# Patient Record
Sex: Male | Born: 1960
Health system: Southern US, Community
[De-identification: ages and names within clinical notes are randomized; demographics above are authoritative.]

## PROBLEM LIST (undated history)

## (undated) DIAGNOSIS — E785 Hyperlipidemia, unspecified: Secondary | ICD-10-CM

## (undated) DIAGNOSIS — Z950 Presence of cardiac pacemaker: Secondary | ICD-10-CM

## (undated) DIAGNOSIS — Z95810 Presence of automatic (implantable) cardiac defibrillator: Secondary | ICD-10-CM

## (undated) DIAGNOSIS — J3089 Other allergic rhinitis: Secondary | ICD-10-CM

## (undated) DIAGNOSIS — I2510 Atherosclerotic heart disease of native coronary artery without angina pectoris: Secondary | ICD-10-CM

## (undated) DIAGNOSIS — G629 Polyneuropathy, unspecified: Secondary | ICD-10-CM

## (undated) DIAGNOSIS — C9000 Multiple myeloma not having achieved remission: Secondary | ICD-10-CM

## (undated) DIAGNOSIS — F32A Depression, unspecified: Secondary | ICD-10-CM

## (undated) DIAGNOSIS — M1990 Unspecified osteoarthritis, unspecified site: Secondary | ICD-10-CM

## (undated) DIAGNOSIS — I639 Cerebral infarction, unspecified: Secondary | ICD-10-CM

## (undated) DIAGNOSIS — J449 Chronic obstructive pulmonary disease, unspecified: Secondary | ICD-10-CM

## (undated) DIAGNOSIS — I219 Acute myocardial infarction, unspecified: Secondary | ICD-10-CM

## (undated) DIAGNOSIS — F329 Major depressive disorder, single episode, unspecified: Secondary | ICD-10-CM

## (undated) DIAGNOSIS — F1910 Other psychoactive substance abuse, uncomplicated: Secondary | ICD-10-CM

## (undated) DIAGNOSIS — Q211 Atrial septal defect, unspecified: Secondary | ICD-10-CM

## (undated) DIAGNOSIS — E46 Unspecified protein-calorie malnutrition: Secondary | ICD-10-CM

## (undated) DIAGNOSIS — F419 Anxiety disorder, unspecified: Secondary | ICD-10-CM

## (undated) DIAGNOSIS — K219 Gastro-esophageal reflux disease without esophagitis: Secondary | ICD-10-CM

## (undated) DIAGNOSIS — I509 Heart failure, unspecified: Secondary | ICD-10-CM

## (undated) DIAGNOSIS — I499 Cardiac arrhythmia, unspecified: Secondary | ICD-10-CM

## (undated) DIAGNOSIS — R0600 Dyspnea, unspecified: Secondary | ICD-10-CM

## (undated) HISTORY — DX: Chronic obstructive pulmonary disease, unspecified: J44.9

## (undated) HISTORY — PX: TOOTH EXTRACTION: SUR596

## (undated) HISTORY — DX: Hyperlipidemia, unspecified: E78.5

## (undated) HISTORY — DX: Gastro-esophageal reflux disease without esophagitis: K21.9

## (undated) HISTORY — PX: CARDIAC DEFIBRILLATOR PLACEMENT: SHX171

## (undated) HISTORY — DX: Heart failure, unspecified: I50.9

## (undated) HISTORY — DX: Atherosclerotic heart disease of native coronary artery without angina pectoris: I25.10

## (undated) HISTORY — DX: Atrial septal defect: Q21.1

## (undated) HISTORY — DX: Acute myocardial infarction, unspecified: I21.9

## (undated) HISTORY — DX: Other psychoactive substance abuse, uncomplicated: F19.10

## (undated) HISTORY — DX: Atrial septal defect, unspecified: Q21.10

## (undated) HISTORY — DX: Unspecified osteoarthritis, unspecified site: M19.90

## (undated) HISTORY — DX: Cardiac arrhythmia, unspecified: I49.9

## (undated) HISTORY — DX: Anxiety disorder, unspecified: F41.9

---
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
## 2008-04-21 ENCOUNTER — Emergency Department: Payer: Self-pay | Admitting: Emergency Medicine

## 2011-05-17 ENCOUNTER — Inpatient Hospital Stay: Payer: Self-pay | Admitting: Internal Medicine

## 2011-05-17 LAB — CBC
HCT: 35.9 % — ABNORMAL LOW (ref 40.0–52.0)
RBC: 3.75 10*6/uL — ABNORMAL LOW (ref 4.40–5.90)
RDW: 14.2 % (ref 11.5–14.5)
WBC: 6.1 10*3/uL (ref 3.8–10.6)

## 2011-05-17 LAB — DRUG SCREEN, URINE
Barbiturates, Ur Screen: NEGATIVE (ref ?–200)
Benzodiazepine, Ur Scrn: NEGATIVE (ref ?–200)
Cannabinoid 50 Ng, Ur ~~LOC~~: NEGATIVE (ref ?–50)
Cocaine Metabolite,Ur ~~LOC~~: NEGATIVE (ref ?–300)
MDMA (Ecstasy)Ur Screen: NEGATIVE (ref ?–500)
Methadone, Ur Screen: NEGATIVE (ref ?–300)
Phencyclidine (PCP) Ur S: NEGATIVE (ref ?–25)
Tricyclic, Ur Screen: NEGATIVE (ref ?–1000)

## 2011-05-17 LAB — COMPREHENSIVE METABOLIC PANEL
Albumin: 3.1 g/dL — ABNORMAL LOW (ref 3.4–5.0)
Alkaline Phosphatase: 54 U/L (ref 50–136)
Anion Gap: 9 (ref 7–16)
BUN: 12 mg/dL (ref 7–18)
Bilirubin,Total: 0.4 mg/dL (ref 0.2–1.0)
Chloride: 107 mmol/L (ref 98–107)
Co2: 23 mmol/L (ref 21–32)
EGFR (Non-African Amer.): >60
Glucose: 89 mg/dL (ref 65–99)
Osmolality: 277 (ref 275–301)
Potassium: 4 mmol/L (ref 3.5–5.1)
SGOT(AST): 67 U/L — ABNORMAL HIGH (ref 15–37)
SGPT (ALT): 58 U/L

## 2011-05-17 LAB — CK-MB: CK-MB: 4.3 ng/mL — ABNORMAL HIGH (ref 0.5–3.6)

## 2011-05-17 LAB — PROTIME-INR: INR: 1

## 2011-05-17 LAB — MAGNESIUM: Magnesium: 1.6 mg/dL — ABNORMAL LOW

## 2011-05-17 LAB — TROPONIN I: Troponin-I: 1.2 ng/mL — ABNORMAL HIGH

## 2011-05-17 LAB — TSH: Thyroid Stimulating Horm: 1.68 u[IU]/mL

## 2011-05-17 LAB — CK: CK, Total: 215 U/L (ref 35–232)

## 2011-05-18 LAB — COMPREHENSIVE METABOLIC PANEL
Albumin: 2.6 g/dL — ABNORMAL LOW (ref 3.4–5.0)
Anion Gap: 8 (ref 7–16)
Bilirubin,Total: 0.4 mg/dL (ref 0.2–1.0)
Calcium, Total: 7.9 mg/dL — ABNORMAL LOW (ref 8.5–10.1)
Co2: 19 mmol/L — ABNORMAL LOW (ref 21–32)
EGFR (African American): >60
EGFR (Non-African Amer.): >60
Potassium: 4.8 mmol/L (ref 3.5–5.1)
SGPT (ALT): 151 U/L — ABNORMAL HIGH
Total Protein: 7.1 g/dL (ref 6.4–8.2)

## 2011-05-18 LAB — TSH: Thyroid Stimulating Horm: 0.25 u[IU]/mL — ABNORMAL LOW

## 2011-05-18 LAB — HEMOGLOBIN: HGB: 11.7 g/dL — ABNORMAL LOW (ref 13.0–18.0)

## 2011-05-19 LAB — CBC WITH DIFFERENTIAL/PLATELET
Basophil #: 0 10*3/uL (ref 0.0–0.1)
Basophil %: 0.1 %
Eosinophil #: 0 10*3/uL (ref 0.0–0.7)
Eosinophil %: 0 %
HGB: 11.6 g/dL — ABNORMAL LOW (ref 13.0–18.0)
Lymphocyte #: 0.6 10*3/uL — ABNORMAL LOW (ref 1.0–3.6)
Lymphocyte %: 4.4 %
MCH: 32 pg (ref 26.0–34.0)
MCHC: 32.8 g/dL (ref 32.0–36.0)
MCV: 98 fL (ref 80–100)
Neutrophil #: 13.1 10*3/uL — ABNORMAL HIGH (ref 1.4–6.5)
Platelet: 221 10*3/uL (ref 150–440)
RBC: 3.64 10*6/uL — ABNORMAL LOW (ref 4.40–5.90)
RDW: 14.6 % — ABNORMAL HIGH (ref 11.5–14.5)
WBC: 14 10*3/uL — ABNORMAL HIGH (ref 3.8–10.6)

## 2011-05-19 LAB — HEPATIC FUNCTION PANEL A (ARMC)
Albumin: 2.7 g/dL — ABNORMAL LOW (ref 3.4–5.0)
Alkaline Phosphatase: 110 U/L (ref 50–136)
Bilirubin,Total: 0.2 mg/dL (ref 0.2–1.0)
SGOT(AST): 43 U/L — ABNORMAL HIGH (ref 15–37)
SGPT (ALT): 93 U/L — ABNORMAL HIGH
Total Protein: 7.2 g/dL (ref 6.4–8.2)

## 2011-05-23 LAB — CULTURE, BLOOD (SINGLE)

## 2011-06-01 ENCOUNTER — Emergency Department: Payer: Self-pay | Admitting: *Deleted

## 2011-06-27 LAB — CBC
HCT: 39.7 % — ABNORMAL LOW (ref 40.0–52.0)
MCH: 32.4 pg (ref 26.0–34.0)
MCHC: 33.8 g/dL (ref 32.0–36.0)
Platelet: 189 10*3/uL (ref 150–440)
RBC: 4.14 10*6/uL — ABNORMAL LOW (ref 4.40–5.90)

## 2011-06-27 LAB — BASIC METABOLIC PANEL
BUN: 11 mg/dL (ref 7–18)
Co2: 23 mmol/L (ref 21–32)
EGFR (African American): >60
Glucose: 99 mg/dL (ref 65–99)
Potassium: 4.4 mmol/L (ref 3.5–5.1)

## 2011-06-27 LAB — PRO B NATRIURETIC PEPTIDE: B-Type Natriuretic Peptide: 5359 pg/mL — ABNORMAL HIGH (ref 0–125)

## 2011-06-27 LAB — CK TOTAL AND CKMB (NOT AT ARMC): CK-MB: 1.5 ng/mL (ref 0.5–3.6)

## 2011-06-28 ENCOUNTER — Inpatient Hospital Stay: Payer: Self-pay | Admitting: Internal Medicine

## 2011-06-28 LAB — TROPONIN I
Troponin-I: 0.76 ng/mL — ABNORMAL HIGH
Troponin-I: 0.73 ng/mL — ABNORMAL HIGH

## 2011-06-28 LAB — DRUG SCREEN, URINE
Benzodiazepine, Ur Scrn: POSITIVE (ref ?–200)
Cannabinoid 50 Ng, Ur ~~LOC~~: POSITIVE (ref ?–50)
Cocaine Metabolite,Ur ~~LOC~~: NEGATIVE (ref ?–300)
MDMA (Ecstasy)Ur Screen: NEGATIVE (ref ?–500)
Opiate, Ur Screen: NEGATIVE (ref ?–300)

## 2011-06-28 LAB — CK TOTAL AND CKMB (NOT AT ARMC): CK, Total: 40 U/L (ref 35–232)

## 2011-06-28 LAB — ETHANOL: Ethanol %: <0.003 % (ref 0.000–0.080)

## 2011-06-29 LAB — CBC
HGB: 13.5 g/dL (ref 13.0–18.0)
MCH: 32.6 pg (ref 26.0–34.0)
MCHC: 33.5 g/dL (ref 32.0–36.0)
RBC: 4.13 10*6/uL — ABNORMAL LOW (ref 4.40–5.90)
RDW: 14.9 % — ABNORMAL HIGH (ref 11.5–14.5)
WBC: 6 10*3/uL (ref 3.8–10.6)

## 2011-06-29 LAB — COMPREHENSIVE METABOLIC PANEL
Alkaline Phosphatase: 55 U/L (ref 50–136)
Anion Gap: 9 (ref 7–16)
Co2: 22 mmol/L (ref 21–32)
Creatinine: 0.99 mg/dL (ref 0.60–1.30)
EGFR (Non-African Amer.): >60
Potassium: 4.6 mmol/L (ref 3.5–5.1)
SGOT(AST): 24 U/L (ref 15–37)
SGPT (ALT): 31 U/L
Total Protein: 7.2 g/dL (ref 6.4–8.2)

## 2011-06-29 LAB — MAGNESIUM: Magnesium: 1.5 mg/dL — ABNORMAL LOW

## 2011-06-30 LAB — BASIC METABOLIC PANEL
Calcium, Total: 8.3 mg/dL — ABNORMAL LOW (ref 8.5–10.1)
Co2: 22 mmol/L (ref 21–32)
Glucose: 104 mg/dL — ABNORMAL HIGH (ref 65–99)
Osmolality: 279 (ref 275–301)
Sodium: 139 mmol/L (ref 136–145)

## 2011-07-26 DIAGNOSIS — F17200 Nicotine dependence, unspecified, uncomplicated: Secondary | ICD-10-CM | POA: Insufficient documentation

## 2011-07-26 DIAGNOSIS — J449 Chronic obstructive pulmonary disease, unspecified: Secondary | ICD-10-CM | POA: Insufficient documentation

## 2011-08-21 DIAGNOSIS — F419 Anxiety disorder, unspecified: Secondary | ICD-10-CM | POA: Insufficient documentation

## 2011-08-21 DIAGNOSIS — K219 Gastro-esophageal reflux disease without esophagitis: Secondary | ICD-10-CM | POA: Insufficient documentation

## 2011-08-22 DIAGNOSIS — I499 Cardiac arrhythmia, unspecified: Secondary | ICD-10-CM | POA: Insufficient documentation

## 2011-08-22 DIAGNOSIS — I219 Acute myocardial infarction, unspecified: Secondary | ICD-10-CM | POA: Insufficient documentation

## 2011-08-22 DIAGNOSIS — Z95810 Presence of automatic (implantable) cardiac defibrillator: Secondary | ICD-10-CM

## 2011-08-22 DIAGNOSIS — I2510 Atherosclerotic heart disease of native coronary artery without angina pectoris: Secondary | ICD-10-CM | POA: Insufficient documentation

## 2011-08-22 DIAGNOSIS — I459 Conduction disorder, unspecified: Secondary | ICD-10-CM | POA: Insufficient documentation

## 2011-08-22 HISTORY — DX: Presence of automatic (implantable) cardiac defibrillator: Z95.810

## 2011-11-20 DIAGNOSIS — Z95810 Presence of automatic (implantable) cardiac defibrillator: Secondary | ICD-10-CM | POA: Insufficient documentation

## 2012-05-29 ENCOUNTER — Inpatient Hospital Stay: Payer: Self-pay | Admitting: Internal Medicine

## 2012-05-29 LAB — URINALYSIS, COMPLETE
Bacteria: NONE SEEN
Bilirubin,UR: NEGATIVE
Blood: NEGATIVE
Ketone: NEGATIVE
Leukocyte Esterase: NEGATIVE
Nitrite: NEGATIVE
Ph: 8 (ref 4.5–8.0)
Protein: NEGATIVE
RBC,UR: NONE SEEN /HPF (ref 0–5)
Specific Gravity: 1.009 (ref 1.003–1.030)
Squamous Epithelial: NONE SEEN
WBC UR: NONE SEEN /HPF (ref 0–5)

## 2012-05-29 LAB — DRUG SCREEN, URINE
Amphetamines, Ur Screen: NEGATIVE (ref ?–1000)
Barbiturates, Ur Screen: NEGATIVE (ref ?–200)
Cannabinoid 50 Ng, Ur ~~LOC~~: POSITIVE (ref ?–50)
Phencyclidine (PCP) Ur S: NEGATIVE (ref ?–25)

## 2012-05-29 LAB — CBC
HCT: 35 % — ABNORMAL LOW (ref 40.0–52.0)
HGB: 12.4 g/dL — ABNORMAL LOW (ref 13.0–18.0)
MCH: 34.9 pg — ABNORMAL HIGH (ref 26.0–34.0)
MCV: 99 fL (ref 80–100)
Platelet: 222 10*3/uL (ref 150–440)
RDW: 14.5 % (ref 11.5–14.5)
WBC: 5 10*3/uL (ref 3.8–10.6)

## 2012-05-29 LAB — APTT
Activated PTT: 43.8 secs — ABNORMAL HIGH (ref 23.6–35.9)
Activated PTT: 75.9 secs — ABNORMAL HIGH (ref 23.6–35.9)

## 2012-05-29 LAB — TROPONIN I: Troponin-I: 0.24 ng/mL — ABNORMAL HIGH

## 2012-05-29 LAB — COMPREHENSIVE METABOLIC PANEL
Albumin: 3.2 g/dL — ABNORMAL LOW (ref 3.4–5.0)
Alkaline Phosphatase: 43 U/L — ABNORMAL LOW (ref 50–136)
BUN: 10 mg/dL (ref 7–18)
Bilirubin,Total: 0.1 mg/dL — ABNORMAL LOW (ref 0.2–1.0)
Chloride: 107 mmol/L (ref 98–107)
Co2: 27 mmol/L (ref 21–32)
Creatinine: 0.9 mg/dL (ref 0.60–1.30)
EGFR (African American): >60
EGFR (Non-African Amer.): >60
Glucose: 80 mg/dL (ref 65–99)
Osmolality: 272 (ref 275–301)
SGOT(AST): 21 U/L (ref 15–37)

## 2012-05-29 LAB — CK TOTAL AND CKMB (NOT AT ARMC)
CK, Total: 101 U/L (ref 35–232)
CK-MB: 1 ng/mL (ref 0.5–3.6)

## 2012-05-29 LAB — PROTIME-INR: Prothrombin Time: 17.8 secs — ABNORMAL HIGH (ref 11.5–14.7)

## 2012-05-29 LAB — PRO B NATRIURETIC PEPTIDE: B-Type Natriuretic Peptide: 297 pg/mL — ABNORMAL HIGH (ref 0–125)

## 2012-05-30 LAB — LIPID PANEL
HDL Cholesterol: 39 mg/dL — ABNORMAL LOW (ref 40–60)
Ldl Cholesterol, Calc: 81 mg/dL (ref 0–100)
Triglycerides: 73 mg/dL (ref 0–200)
VLDL Cholesterol, Calc: 15 mg/dL (ref 5–40)

## 2012-05-30 LAB — PROTIME-INR
INR: 1.9
Prothrombin Time: 21.1 secs — ABNORMAL HIGH (ref 11.5–14.7)

## 2012-05-30 LAB — TROPONIN I: Troponin-I: 0.45 ng/mL — ABNORMAL HIGH

## 2012-05-31 LAB — APTT: Activated PTT: 40.5 secs — ABNORMAL HIGH (ref 23.6–35.9)

## 2013-01-07 ENCOUNTER — Ambulatory Visit: Payer: Self-pay | Admitting: Physician Assistant

## 2013-09-24 LAB — COMPREHENSIVE METABOLIC PANEL
AST: 25 U/L (ref 15–37)
Albumin: 2.6 g/dL — ABNORMAL LOW (ref 3.4–5.0)
Alkaline Phosphatase: 40 U/L — ABNORMAL LOW
Anion Gap: 4 — ABNORMAL LOW (ref 7–16)
BUN: 16 mg/dL (ref 7–18)
Bilirubin,Total: 0.3 mg/dL (ref 0.2–1.0)
CREATININE: 1.79 mg/dL — AB (ref 0.60–1.30)
Calcium, Total: 10.2 mg/dL — ABNORMAL HIGH (ref 8.5–10.1)
Chloride: 101 mmol/L (ref 98–107)
Co2: 26 mmol/L (ref 21–32)
EGFR (African American): 49 — ABNORMAL LOW
GFR CALC NON AF AMER: 42 — AB
Glucose: 84 mg/dL (ref 65–99)
OSMOLALITY: 263 (ref 275–301)
Potassium: 4.8 mmol/L (ref 3.5–5.1)
SGPT (ALT): 10 U/L — ABNORMAL LOW
SODIUM: 131 mmol/L — AB (ref 136–145)
TOTAL PROTEIN: 12.5 g/dL — AB (ref 6.4–8.2)

## 2013-09-24 LAB — CBC WITH DIFFERENTIAL/PLATELET
BASOS ABS: 0 10*3/uL (ref 0.0–0.1)
Basophil %: 0.5 %
EOS ABS: 0 10*3/uL (ref 0.0–0.7)
EOS PCT: 0.2 %
HCT: 25.7 % — ABNORMAL LOW (ref 40.0–52.0)
HGB: 8.6 g/dL — ABNORMAL LOW (ref 13.0–18.0)
LYMPHS PCT: 15.3 %
Lymphocyte #: 1.3 10*3/uL (ref 1.0–3.6)
MCH: 34.5 pg — AB (ref 26.0–34.0)
MCHC: 33.3 g/dL (ref 32.0–36.0)
MCV: 103 fL — ABNORMAL HIGH (ref 80–100)
Monocyte #: 0.9 x10 3/mm (ref 0.2–1.0)
Monocyte %: 10.4 %
NEUTROS ABS: 6.3 10*3/uL (ref 1.4–6.5)
Neutrophil %: 73.6 %
PLATELETS: 285 10*3/uL (ref 150–440)
RBC: 2.48 10*6/uL — ABNORMAL LOW (ref 4.40–5.90)
RDW: 14.8 % — AB (ref 11.5–14.5)
WBC: 8.6 10*3/uL (ref 3.8–10.6)

## 2013-09-24 LAB — URINALYSIS, COMPLETE
BILIRUBIN, UR: NEGATIVE
BLOOD: NEGATIVE
Bacteria: NONE SEEN
Glucose,UR: NEGATIVE mg/dL (ref 0–75)
Ketone: NEGATIVE
Leukocyte Esterase: NEGATIVE
NITRITE: NEGATIVE
PH: 6 (ref 4.5–8.0)
RBC,UR: 1 /HPF (ref 0–5)
SQUAMOUS EPITHELIAL: NONE SEEN
Specific Gravity: 1.011 (ref 1.003–1.030)
WBC UR: 1 /HPF (ref 0–5)

## 2013-09-24 LAB — DRUG SCREEN, URINE
AMPHETAMINES, UR SCREEN: NEGATIVE (ref ?–1000)
BENZODIAZEPINE, UR SCRN: NEGATIVE (ref ?–200)
Barbiturates, Ur Screen: NEGATIVE (ref ?–200)
Cannabinoid 50 Ng, Ur ~~LOC~~: POSITIVE (ref ?–50)
Cocaine Metabolite,Ur ~~LOC~~: POSITIVE (ref ?–300)
MDMA (ECSTASY) UR SCREEN: NEGATIVE (ref ?–500)
Methadone, Ur Screen: NEGATIVE (ref ?–300)
Opiate, Ur Screen: NEGATIVE (ref ?–300)
Phencyclidine (PCP) Ur S: NEGATIVE (ref ?–25)
Tricyclic, Ur Screen: NEGATIVE (ref ?–1000)

## 2013-09-24 LAB — RAPID HIV SCREEN (HIV 1/2 AB+AG)

## 2013-09-24 LAB — LIPASE, BLOOD: LIPASE: 43 U/L — AB (ref 73–393)

## 2013-09-24 LAB — TROPONIN I: Troponin-I: 0.09 ng/mL — ABNORMAL HIGH

## 2013-09-25 ENCOUNTER — Inpatient Hospital Stay: Payer: Self-pay | Admitting: Internal Medicine

## 2013-09-25 LAB — PSA: PSA: 0.6 ng/mL (ref 0.0–4.0)

## 2013-09-25 LAB — HCG, QUANTITATIVE, PREGNANCY: Beta Hcg, Quant.: <1 m[IU]/mL — ABNORMAL LOW

## 2013-09-25 LAB — CANCER ANTIGEN 19-9: CA 19-9: 9 U/mL (ref 0–35)

## 2013-09-25 LAB — IRON AND TIBC
Iron Bind.Cap.(Total): 259 ug/dL (ref 250–450)
Iron Saturation: 23 %
Iron: 59 ug/dL — ABNORMAL LOW (ref 65–175)
Unbound Iron-Bind.Cap.: 200 ug/dL

## 2013-09-25 LAB — CEA: CEA: 1.2 ng/mL (ref 0.0–4.7)

## 2013-09-25 LAB — TROPONIN I
TROPONIN-I: 0.12 ng/mL — AB
Troponin-I: 0.11 ng/mL — ABNORMAL HIGH

## 2013-09-26 ENCOUNTER — Ambulatory Visit: Payer: Self-pay | Admitting: Urology

## 2013-09-26 ENCOUNTER — Ambulatory Visit: Payer: Self-pay | Admitting: Internal Medicine

## 2013-09-26 LAB — BASIC METABOLIC PANEL
ANION GAP: 4 — AB (ref 7–16)
BUN: 23 mg/dL — ABNORMAL HIGH (ref 7–18)
CALCIUM: 9.2 mg/dL (ref 8.5–10.1)
CHLORIDE: 102 mmol/L (ref 98–107)
CO2: 26 mmol/L (ref 21–32)
Creatinine: 1.62 mg/dL — ABNORMAL HIGH (ref 0.60–1.30)
GFR CALC AF AMER: 55 — AB
GFR CALC NON AF AMER: 47 — AB
Glucose: 72 mg/dL (ref 65–99)
Osmolality: 267 (ref 275–301)
POTASSIUM: 4.5 mmol/L (ref 3.5–5.1)
Sodium: 132 mmol/L — ABNORMAL LOW (ref 136–145)

## 2013-09-26 LAB — CBC WITH DIFFERENTIAL/PLATELET
BASOS ABS: 0 10*3/uL (ref 0.0–0.1)
Basophil %: 0.3 %
EOS PCT: 0.9 %
Eosinophil #: 0 10*3/uL (ref 0.0–0.7)
HCT: 24.2 % — ABNORMAL LOW (ref 40.0–52.0)
HGB: 8.1 g/dL — ABNORMAL LOW (ref 13.0–18.0)
LYMPHS PCT: 26.2 %
Lymphocyte #: 1 10*3/uL (ref 1.0–3.6)
MCH: 34.2 pg — AB (ref 26.0–34.0)
MCHC: 33.6 g/dL (ref 32.0–36.0)
MCV: 102 fL — ABNORMAL HIGH (ref 80–100)
Monocyte #: 0.6 x10 3/mm (ref 0.2–1.0)
Monocyte %: 14.8 %
Neutrophil #: 2.3 10*3/uL (ref 1.4–6.5)
Neutrophil %: 57.8 %
PLATELETS: 250 10*3/uL (ref 150–440)
RBC: 2.38 10*6/uL — ABNORMAL LOW (ref 4.40–5.90)
RDW: 14.8 % — AB (ref 11.5–14.5)
WBC: 3.9 10*3/uL (ref 3.8–10.6)

## 2013-09-26 LAB — UR PROT ELECTROPHORESIS, URINE RANDOM

## 2013-09-26 LAB — AFP TUMOR MARKER: AFP-Tumor Marker: 2.7 ng/mL (ref 0.0–8.3)

## 2013-09-28 ENCOUNTER — Emergency Department: Payer: Self-pay | Admitting: Emergency Medicine

## 2013-09-28 LAB — COMPREHENSIVE METABOLIC PANEL
ALBUMIN: 2.7 g/dL — AB (ref 3.4–5.0)
ALK PHOS: 34 U/L — AB
ALT: 12 U/L — AB
AST: 24 U/L (ref 15–37)
Anion Gap: 6 — ABNORMAL LOW (ref 7–16)
BILIRUBIN TOTAL: 0.2 mg/dL (ref 0.2–1.0)
BUN: 20 mg/dL — ABNORMAL HIGH (ref 7–18)
CHLORIDE: 97 mmol/L — AB (ref 98–107)
CREATININE: 1.6 mg/dL — AB (ref 0.60–1.30)
Calcium, Total: 10.3 mg/dL — ABNORMAL HIGH (ref 8.5–10.1)
Co2: 25 mmol/L (ref 21–32)
EGFR (Non-African Amer.): 48 — ABNORMAL LOW
GFR CALC AF AMER: 56 — AB
Glucose: 87 mg/dL (ref 65–99)
OSMOLALITY: 259 (ref 275–301)
Potassium: 4.5 mmol/L (ref 3.5–5.1)
Sodium: 128 mmol/L — ABNORMAL LOW (ref 136–145)
TOTAL PROTEIN: 13.1 g/dL — AB (ref 6.4–8.2)

## 2013-09-28 LAB — CBC WITH DIFFERENTIAL/PLATELET
BASOS PCT: 0.6 %
Basophil #: 0 10*3/uL (ref 0.0–0.1)
Eosinophil #: 0 10*3/uL (ref 0.0–0.7)
Eosinophil %: 0.2 %
HCT: 27.5 % — AB (ref 40.0–52.0)
HGB: 9.2 g/dL — ABNORMAL LOW (ref 13.0–18.0)
LYMPHS PCT: 24.5 %
Lymphocyte #: 1.3 10*3/uL (ref 1.0–3.6)
MCH: 34.3 pg — ABNORMAL HIGH (ref 26.0–34.0)
MCHC: 33.5 g/dL (ref 32.0–36.0)
MCV: 102 fL — AB (ref 80–100)
Monocyte #: 0.6 x10 3/mm (ref 0.2–1.0)
Monocyte %: 12.1 %
NEUTROS PCT: 62.6 %
Neutrophil #: 3.3 10*3/uL (ref 1.4–6.5)
PLATELETS: 304 10*3/uL (ref 150–440)
RBC: 2.69 10*6/uL — AB (ref 4.40–5.90)
RDW: 14.4 % (ref 11.5–14.5)
WBC: 5.3 10*3/uL (ref 3.8–10.6)

## 2013-09-28 LAB — LIPASE, BLOOD: Lipase: 35 U/L — ABNORMAL LOW (ref 73–393)

## 2013-09-28 LAB — TROPONIN I: Troponin-I: 0.13 ng/mL — ABNORMAL HIGH

## 2013-09-29 ENCOUNTER — Ambulatory Visit: Payer: Self-pay | Admitting: Internal Medicine

## 2013-09-30 LAB — CBC CANCER CENTER
Bands: 12 %
HCT: 24.7 % — ABNORMAL LOW (ref 40.0–52.0)
HGB: 8.6 g/dL — ABNORMAL LOW (ref 13.0–18.0)
Lymphocytes: 6 %
MCH: 34.8 pg — ABNORMAL HIGH (ref 26.0–34.0)
MCHC: 34.8 g/dL (ref 32.0–36.0)
MCV: 100 fL (ref 80–100)
MONOS PCT: 8 %
PLATELETS: 312 x10 3/mm (ref 150–440)
RBC: 2.47 10*6/uL — ABNORMAL LOW (ref 4.40–5.90)
RDW: 14.6 % — AB (ref 11.5–14.5)
Segmented Neutrophils: 71 %
Variant Lymphocyte: 3 %
WBC: 15 x10 3/mm — ABNORMAL HIGH (ref 3.8–10.6)

## 2013-10-01 LAB — KAPPA/LAMBDA FREE LIGHT CHAINS (ARMC)

## 2013-10-02 LAB — CREATININE, SERUM
CREATININE: 1.69 mg/dL — AB (ref 0.60–1.30)
EGFR (African American): 52 — ABNORMAL LOW
EGFR (Non-African Amer.): 45 — ABNORMAL LOW

## 2013-10-02 LAB — URIC ACID: Uric Acid: 7.1 mg/dL (ref 3.5–7.2)

## 2013-10-03 LAB — BASIC METABOLIC PANEL
Anion Gap: 5 — ABNORMAL LOW (ref 7–16)
BUN: 16 mg/dL (ref 7–18)
CALCIUM: 10.1 mg/dL (ref 8.5–10.1)
Chloride: 97 mmol/L — ABNORMAL LOW (ref 98–107)
Co2: 26 mmol/L (ref 21–32)
Creatinine: 1.41 mg/dL — ABNORMAL HIGH (ref 0.60–1.30)
GFR CALC NON AF AMER: 56 — AB
Glucose: 102 mg/dL — ABNORMAL HIGH (ref 65–99)
Osmolality: 258 (ref 275–301)
Potassium: 4 mmol/L (ref 3.5–5.1)
Sodium: 128 mmol/L — ABNORMAL LOW (ref 136–145)

## 2013-10-03 LAB — FOLATE: Folic Acid: 18.8 ng/mL (ref 3.1–100.0)

## 2013-10-06 LAB — PSA: PSA: 0.6 ng/mL (ref 0.0–4.0)

## 2013-10-07 LAB — BASIC METABOLIC PANEL
ANION GAP: 5 — AB (ref 7–16)
BUN: 20 mg/dL — AB (ref 7–18)
CALCIUM: 7.7 mg/dL — AB (ref 8.5–10.1)
CHLORIDE: 93 mmol/L — AB (ref 98–107)
Co2: 26 mmol/L (ref 21–32)
Creatinine: 1.55 mg/dL — ABNORMAL HIGH (ref 0.60–1.30)
EGFR (African American): 58 — ABNORMAL LOW
EGFR (Non-African Amer.): 50 — ABNORMAL LOW
GLUCOSE: 91 mg/dL (ref 65–99)
Osmolality: 252 (ref 275–301)
POTASSIUM: 4 mmol/L (ref 3.5–5.1)
Sodium: 124 mmol/L — ABNORMAL LOW (ref 136–145)

## 2013-10-07 LAB — CBC CANCER CENTER
BASOS ABS: 0.2 x10 3/mm — AB (ref 0.0–0.1)
Basophil %: 2.8 %
Eosinophil #: 0 x10 3/mm (ref 0.0–0.7)
Eosinophil %: 0.2 %
HCT: 23.5 % — ABNORMAL LOW (ref 40.0–52.0)
HGB: 8 g/dL — ABNORMAL LOW (ref 13.0–18.0)
Lymphocyte #: 1.2 x10 3/mm (ref 1.0–3.6)
Lymphocyte %: 16.9 %
MCH: 34.1 pg — ABNORMAL HIGH (ref 26.0–34.0)
MCHC: 34.1 g/dL (ref 32.0–36.0)
MCV: 100 fL (ref 80–100)
Monocyte #: 0.7 x10 3/mm (ref 0.2–1.0)
Monocyte %: 9.7 %
NEUTROS ABS: 4.9 x10 3/mm (ref 1.4–6.5)
Neutrophil %: 70.4 %
PLATELETS: 438 x10 3/mm (ref 150–440)
RBC: 2.35 10*6/uL — ABNORMAL LOW (ref 4.40–5.90)
RDW: 14.6 % — ABNORMAL HIGH (ref 11.5–14.5)
WBC: 7 x10 3/mm (ref 3.8–10.6)

## 2013-10-10 ENCOUNTER — Inpatient Hospital Stay: Payer: Self-pay | Admitting: Internal Medicine

## 2013-10-10 LAB — COMPREHENSIVE METABOLIC PANEL
ALBUMIN: 2.2 g/dL — AB (ref 3.4–5.0)
ALT: 13 U/L — AB
Alkaline Phosphatase: 32 U/L — ABNORMAL LOW
Anion Gap: 6 — ABNORMAL LOW (ref 7–16)
BILIRUBIN TOTAL: 0.1 mg/dL — AB (ref 0.2–1.0)
BUN: 18 mg/dL (ref 7–18)
CHLORIDE: 101 mmol/L (ref 98–107)
Calcium, Total: 6.9 mg/dL — CL (ref 8.5–10.1)
Co2: 22 mmol/L (ref 21–32)
Creatinine: 1.28 mg/dL (ref 0.60–1.30)
EGFR (African American): >60
EGFR (Non-African Amer.): >60
Glucose: 74 mg/dL (ref 65–99)
OSMOLALITY: 259 (ref 275–301)
POTASSIUM: 4 mmol/L (ref 3.5–5.1)
SGOT(AST): 29 U/L (ref 15–37)
Sodium: 129 mmol/L — ABNORMAL LOW (ref 136–145)
TOTAL PROTEIN: 11.1 g/dL — AB (ref 6.4–8.2)

## 2013-10-10 LAB — CBC
HCT: 23.8 % — ABNORMAL LOW (ref 40.0–52.0)
HGB: 7.9 g/dL — AB (ref 13.0–18.0)
MCH: 33.2 pg (ref 26.0–34.0)
MCHC: 33.1 g/dL (ref 32.0–36.0)
MCV: 101 fL — AB (ref 80–100)
Platelet: 456 10*3/uL — ABNORMAL HIGH (ref 150–440)
RBC: 2.37 10*6/uL — ABNORMAL LOW (ref 4.40–5.90)
RDW: 14.9 % — AB (ref 11.5–14.5)
WBC: 10.5 10*3/uL (ref 3.8–10.6)

## 2013-10-10 LAB — CK TOTAL AND CKMB (NOT AT ARMC)
CK, TOTAL: 255 U/L
CK, Total: 197 U/L
CK, Total: 207 U/L
CK-MB: 2.5 ng/mL (ref 0.5–3.6)
CK-MB: 1.7 ng/mL (ref 0.5–3.6)
CK-MB: 1.8 ng/mL (ref 0.5–3.6)

## 2013-10-10 LAB — TROPONIN I
TROPONIN-I: 0.09 ng/mL — AB
TROPONIN-I: 0.1 ng/mL — AB
Troponin-I: 0.09 ng/mL — ABNORMAL HIGH

## 2013-10-10 LAB — PRO B NATRIURETIC PEPTIDE: B-Type Natriuretic Peptide: 138 pg/mL — ABNORMAL HIGH (ref 0–125)

## 2013-10-11 LAB — CBC WITH DIFFERENTIAL/PLATELET
BASOS ABS: 0 10*3/uL (ref 0.0–0.1)
Basophil %: 0.3 %
EOS ABS: 0 10*3/uL (ref 0.0–0.7)
EOS PCT: 0.1 %
HCT: 24.1 % — ABNORMAL LOW (ref 40.0–52.0)
HGB: 8.3 g/dL — AB (ref 13.0–18.0)
LYMPHS ABS: 1.7 10*3/uL (ref 1.0–3.6)
LYMPHS PCT: 20.3 %
MCH: 34.7 pg — ABNORMAL HIGH (ref 26.0–34.0)
MCHC: 34.3 g/dL (ref 32.0–36.0)
MCV: 101 fL — ABNORMAL HIGH (ref 80–100)
MONOS PCT: 9.4 %
Monocyte #: 0.8 x10 3/mm (ref 0.2–1.0)
NEUTROS ABS: 5.8 10*3/uL (ref 1.4–6.5)
NEUTROS PCT: 69.9 %
Platelet: 438 10*3/uL (ref 150–440)
RBC: 2.38 10*6/uL — AB (ref 4.40–5.90)
RDW: 14.8 % — AB (ref 11.5–14.5)
WBC: 8.3 10*3/uL (ref 3.8–10.6)

## 2013-10-11 LAB — BASIC METABOLIC PANEL
Anion Gap: 5 — ABNORMAL LOW (ref 7–16)
BUN: 13 mg/dL (ref 7–18)
CALCIUM: 7.2 mg/dL — AB (ref 8.5–10.1)
CHLORIDE: 109 mmol/L — AB (ref 98–107)
Co2: 22 mmol/L (ref 21–32)
Creatinine: 1.16 mg/dL (ref 0.60–1.30)
EGFR (African American): >60
Glucose: 107 mg/dL — ABNORMAL HIGH (ref 65–99)
OSMOLALITY: 273 (ref 275–301)
Potassium: 4.2 mmol/L (ref 3.5–5.1)
SODIUM: 136 mmol/L (ref 136–145)

## 2013-10-13 ENCOUNTER — Inpatient Hospital Stay: Payer: Self-pay | Admitting: Internal Medicine

## 2013-10-13 LAB — URINALYSIS, COMPLETE
Bacteria: NONE SEEN
Bilirubin,UR: NEGATIVE
Blood: NEGATIVE
GLUCOSE, UR: NEGATIVE mg/dL (ref 0–75)
KETONE: NEGATIVE
Leukocyte Esterase: NEGATIVE
Nitrite: NEGATIVE
Ph: 8 (ref 4.5–8.0)
Protein: 25
Specific Gravity: 1.01 (ref 1.003–1.030)
Squamous Epithelial: <1
WBC UR: 1 /HPF (ref 0–5)

## 2013-10-13 LAB — COMPREHENSIVE METABOLIC PANEL
ALBUMIN: 2.3 g/dL — AB (ref 3.4–5.0)
ANION GAP: 5 — AB (ref 7–16)
AST: 22 U/L (ref 15–37)
Alkaline Phosphatase: 37 U/L — ABNORMAL LOW
BUN: 11 mg/dL (ref 7–18)
Bilirubin,Total: 0.3 mg/dL (ref 0.2–1.0)
CREATININE: 1.17 mg/dL (ref 0.60–1.30)
Calcium, Total: 7.7 mg/dL — ABNORMAL LOW (ref 8.5–10.1)
Chloride: 101 mmol/L (ref 98–107)
Co2: 24 mmol/L (ref 21–32)
EGFR (Non-African Amer.): >60
Glucose: 87 mg/dL (ref 65–99)
Osmolality: 260 (ref 275–301)
Potassium: 4.2 mmol/L (ref 3.5–5.1)
SGPT (ALT): 12 U/L — ABNORMAL LOW
Sodium: 130 mmol/L — ABNORMAL LOW (ref 136–145)
Total Protein: 11.7 g/dL — ABNORMAL HIGH (ref 6.4–8.2)

## 2013-10-13 LAB — CBC
HCT: 26.5 % — ABNORMAL LOW (ref 40.0–52.0)
HGB: 8.7 g/dL — ABNORMAL LOW (ref 13.0–18.0)
MCH: 33.3 pg (ref 26.0–34.0)
MCHC: 32.9 g/dL (ref 32.0–36.0)
MCV: 101 fL — ABNORMAL HIGH (ref 80–100)
Platelet: 512 10*3/uL — ABNORMAL HIGH (ref 150–440)
RBC: 2.62 10*6/uL — ABNORMAL LOW (ref 4.40–5.90)
RDW: 15.3 % — ABNORMAL HIGH (ref 11.5–14.5)
WBC: 10 10*3/uL (ref 3.8–10.6)

## 2013-10-14 LAB — CBC WITH DIFFERENTIAL/PLATELET
BASOS ABS: 0 10*3/uL (ref 0.0–0.1)
Basophil %: 0.4 %
Eosinophil #: 0 10*3/uL (ref 0.0–0.7)
Eosinophil %: 0.2 %
HCT: 25.2 % — ABNORMAL LOW (ref 40.0–52.0)
HGB: 8.4 g/dL — AB (ref 13.0–18.0)
Lymphocyte #: 1.2 10*3/uL (ref 1.0–3.6)
Lymphocyte %: 11.7 %
MCH: 33.6 pg (ref 26.0–34.0)
MCHC: 33.2 g/dL (ref 32.0–36.0)
MCV: 101 fL — ABNORMAL HIGH (ref 80–100)
Monocyte #: 1 x10 3/mm (ref 0.2–1.0)
Monocyte %: 9.4 %
NEUTROS PCT: 78.3 %
Neutrophil #: 7.9 10*3/uL — ABNORMAL HIGH (ref 1.4–6.5)
Platelet: 444 10*3/uL — ABNORMAL HIGH (ref 150–440)
RBC: 2.48 10*6/uL — AB (ref 4.40–5.90)
RDW: 15.3 % — AB (ref 11.5–14.5)
WBC: 10.1 10*3/uL (ref 3.8–10.6)

## 2013-10-14 LAB — DRUG SCREEN, URINE

## 2013-10-14 LAB — BASIC METABOLIC PANEL
Anion Gap: 2 — ABNORMAL LOW (ref 7–16)
BUN: 12 mg/dL (ref 7–18)
CHLORIDE: 102 mmol/L (ref 98–107)
CO2: 26 mmol/L (ref 21–32)
Calcium, Total: 7.5 mg/dL — ABNORMAL LOW (ref 8.5–10.1)
Creatinine: 1.19 mg/dL (ref 0.60–1.30)
EGFR (Non-African Amer.): >60
Glucose: 87 mg/dL (ref 65–99)
OSMOLALITY: 260 (ref 275–301)
Potassium: 4.1 mmol/L (ref 3.5–5.1)
SODIUM: 130 mmol/L — AB (ref 136–145)

## 2013-10-15 LAB — PROTEIN ELECTROPHORESIS(ARMC)

## 2013-10-17 LAB — CBC CANCER CENTER
BASOS PCT: 0.6 %
Basophil #: 0 x10 3/mm (ref 0.0–0.1)
Eosinophil #: 0 x10 3/mm (ref 0.0–0.7)
Eosinophil %: 0.4 %
HCT: 26.8 % — ABNORMAL LOW (ref 40.0–52.0)
HGB: 8.9 g/dL — ABNORMAL LOW (ref 13.0–18.0)
LYMPHS PCT: 19.6 %
Lymphocyte #: 1.4 x10 3/mm (ref 1.0–3.6)
MCH: 33.3 pg (ref 26.0–34.0)
MCHC: 33.3 g/dL (ref 32.0–36.0)
MCV: 100 fL (ref 80–100)
Monocyte #: 0.7 x10 3/mm (ref 0.2–1.0)
Monocyte %: 10.3 %
NEUTROS PCT: 69.1 %
Neutrophil #: 4.9 x10 3/mm (ref 1.4–6.5)
PLATELETS: 503 x10 3/mm — AB (ref 150–440)
RBC: 2.67 10*6/uL — ABNORMAL LOW (ref 4.40–5.90)
RDW: 15 % — ABNORMAL HIGH (ref 11.5–14.5)
WBC: 7 x10 3/mm (ref 3.8–10.6)

## 2013-10-21 LAB — CBC CANCER CENTER
BASOS ABS: 0 x10 3/mm (ref 0.0–0.1)
Basophil %: 0.4 %
EOS ABS: 0 x10 3/mm (ref 0.0–0.7)
Eosinophil %: 0.2 %
HCT: 25.6 % — AB (ref 40.0–52.0)
HGB: 8.6 g/dL — ABNORMAL LOW (ref 13.0–18.0)
LYMPHS ABS: 0.9 x10 3/mm — AB (ref 1.0–3.6)
Lymphocyte %: 13.9 %
MCH: 33.4 pg (ref 26.0–34.0)
MCHC: 33.5 g/dL (ref 32.0–36.0)
MCV: 100 fL (ref 80–100)
Monocyte #: 0.4 x10 3/mm (ref 0.2–1.0)
Monocyte %: 6.4 %
NEUTROS PCT: 79.1 %
Neutrophil #: 5.2 x10 3/mm (ref 1.4–6.5)
Platelet: 373 x10 3/mm (ref 150–440)
RBC: 2.57 10*6/uL — ABNORMAL LOW (ref 4.40–5.90)
RDW: 14.9 % — ABNORMAL HIGH (ref 11.5–14.5)
WBC: 6.6 x10 3/mm (ref 3.8–10.6)

## 2013-10-23 ENCOUNTER — Ambulatory Visit: Payer: Self-pay | Admitting: Oncology

## 2013-10-23 ENCOUNTER — Ambulatory Visit: Payer: Self-pay | Admitting: Internal Medicine

## 2013-10-23 LAB — PROT IMMUNOELECTROPHORES(ARMC)

## 2013-10-24 LAB — CBC CANCER CENTER
BASOS ABS: 0 x10 3/mm (ref 0.0–0.1)
BASOS PCT: 0.2 %
Eosinophil #: 0.1 x10 3/mm (ref 0.0–0.7)
Eosinophil %: 1 %
HCT: 24.6 % — AB (ref 40.0–52.0)
HGB: 8.1 g/dL — AB (ref 13.0–18.0)
LYMPHS ABS: 0.8 x10 3/mm — AB (ref 1.0–3.6)
LYMPHS PCT: 13.3 %
MCH: 33.4 pg (ref 26.0–34.0)
MCHC: 33.1 g/dL (ref 32.0–36.0)
MCV: 101 fL — AB (ref 80–100)
MONO ABS: 0.3 x10 3/mm (ref 0.2–1.0)
Monocyte %: 4.7 %
NEUTROS PCT: 80.8 %
Neutrophil #: 4.9 x10 3/mm (ref 1.4–6.5)
PLATELETS: 269 x10 3/mm (ref 150–440)
RBC: 2.44 10*6/uL — ABNORMAL LOW (ref 4.40–5.90)
RDW: 15.2 % — ABNORMAL HIGH (ref 11.5–14.5)
WBC: 6 x10 3/mm (ref 3.8–10.6)

## 2013-10-25 ENCOUNTER — Inpatient Hospital Stay: Payer: Self-pay | Admitting: Internal Medicine

## 2013-10-25 LAB — COMPREHENSIVE METABOLIC PANEL
ALBUMIN: 2.3 g/dL — AB (ref 3.4–5.0)
ANION GAP: 4 — AB (ref 7–16)
AST: 18 U/L (ref 15–37)
Alkaline Phosphatase: 37 U/L — ABNORMAL LOW
BUN: 17 mg/dL (ref 7–18)
Bilirubin,Total: 0.2 mg/dL (ref 0.2–1.0)
CHLORIDE: 100 mmol/L (ref 98–107)
CREATININE: 1.04 mg/dL (ref 0.60–1.30)
Calcium, Total: 8.7 mg/dL (ref 8.5–10.1)
Co2: 28 mmol/L (ref 21–32)
EGFR (African American): >60
EGFR (Non-African Amer.): >60
GLUCOSE: 80 mg/dL (ref 65–99)
OSMOLALITY: 265 (ref 275–301)
Potassium: 4.2 mmol/L (ref 3.5–5.1)
SGPT (ALT): 14 U/L
SODIUM: 132 mmol/L — AB (ref 136–145)
TOTAL PROTEIN: 9.9 g/dL — AB (ref 6.4–8.2)

## 2013-10-25 LAB — CBC
HCT: 25 % — ABNORMAL LOW (ref 40.0–52.0)
HGB: 8.1 g/dL — ABNORMAL LOW (ref 13.0–18.0)
MCH: 32.6 pg (ref 26.0–34.0)
MCHC: 32.3 g/dL (ref 32.0–36.0)
MCV: 101 fL — ABNORMAL HIGH (ref 80–100)
PLATELETS: 246 10*3/uL (ref 150–440)
RBC: 2.48 10*6/uL — ABNORMAL LOW (ref 4.40–5.90)
RDW: 15.4 % — ABNORMAL HIGH (ref 11.5–14.5)
WBC: 5.6 10*3/uL (ref 3.8–10.6)

## 2013-10-25 LAB — PROTIME-INR
INR: 1.2
Prothrombin Time: 14.8 secs — ABNORMAL HIGH (ref 11.5–14.7)

## 2013-10-25 LAB — APTT: Activated PTT: 28.4 secs (ref 23.6–35.9)

## 2013-10-25 LAB — LIPASE, BLOOD: LIPASE: 45 U/L — AB (ref 73–393)

## 2013-10-25 LAB — TROPONIN I: TROPONIN-I: 0.05 ng/mL

## 2013-10-26 LAB — BASIC METABOLIC PANEL
Anion Gap: 2 — ABNORMAL LOW (ref 7–16)
BUN: 15 mg/dL (ref 7–18)
CREATININE: 1.06 mg/dL (ref 0.60–1.30)
Calcium, Total: 7.7 mg/dL — ABNORMAL LOW (ref 8.5–10.1)
Chloride: 103 mmol/L (ref 98–107)
Co2: 26 mmol/L (ref 21–32)
EGFR (African American): >60
Glucose: 114 mg/dL — ABNORMAL HIGH (ref 65–99)
Osmolality: 264 (ref 275–301)
POTASSIUM: 4.7 mmol/L (ref 3.5–5.1)
Sodium: 131 mmol/L — ABNORMAL LOW (ref 136–145)

## 2013-10-28 LAB — CBC CANCER CENTER
BASOS ABS: 0 x10 3/mm (ref 0.0–0.1)
Basophil %: 0.2 %
EOS PCT: 1.5 %
Eosinophil #: 0.1 x10 3/mm (ref 0.0–0.7)
HCT: 28.3 % — AB (ref 40.0–52.0)
HGB: 9.2 g/dL — ABNORMAL LOW (ref 13.0–18.0)
LYMPHS PCT: 8.4 %
Lymphocyte #: 0.6 x10 3/mm — ABNORMAL LOW (ref 1.0–3.6)
MCH: 32.9 pg (ref 26.0–34.0)
MCHC: 32.5 g/dL (ref 32.0–36.0)
MCV: 101 fL — ABNORMAL HIGH (ref 80–100)
MONO ABS: 0.6 x10 3/mm (ref 0.2–1.0)
Monocyte %: 9.2 %
NEUTROS ABS: 5.7 x10 3/mm (ref 1.4–6.5)
Neutrophil %: 80.7 %
Platelet: 250 x10 3/mm (ref 150–440)
RBC: 2.8 10*6/uL — ABNORMAL LOW (ref 4.40–5.90)
RDW: 16.4 % — ABNORMAL HIGH (ref 11.5–14.5)
WBC: 7 x10 3/mm (ref 3.8–10.6)

## 2013-10-28 LAB — HEPATIC FUNCTION PANEL A (ARMC)
AST: 8 U/L — AB (ref 15–37)
Albumin: 2.3 g/dL — ABNORMAL LOW (ref 3.4–5.0)
Alkaline Phosphatase: 37 U/L — ABNORMAL LOW
BILIRUBIN DIRECT: 0.1 mg/dL (ref 0.00–0.20)
Bilirubin,Total: 0.2 mg/dL (ref 0.2–1.0)
SGPT (ALT): 16 U/L
TOTAL PROTEIN: 8.2 g/dL (ref 6.4–8.2)

## 2013-10-28 LAB — BASIC METABOLIC PANEL
Anion Gap: 3 — ABNORMAL LOW (ref 7–16)
BUN: 20 mg/dL — AB (ref 7–18)
Calcium, Total: 8.6 mg/dL (ref 8.5–10.1)
Chloride: 98 mmol/L (ref 98–107)
Co2: 28 mmol/L (ref 21–32)
Creatinine: 0.96 mg/dL (ref 0.60–1.30)
EGFR (Non-African Amer.): >60
GLUCOSE: 93 mg/dL (ref 65–99)
OSMOLALITY: 261 (ref 275–301)
Potassium: 4.5 mmol/L (ref 3.5–5.1)
Sodium: 129 mmol/L — ABNORMAL LOW (ref 136–145)

## 2013-11-04 LAB — COMPREHENSIVE METABOLIC PANEL
AST: 9 U/L — AB (ref 15–37)
Albumin: 2.2 g/dL — ABNORMAL LOW (ref 3.4–5.0)
Alkaline Phosphatase: 31 U/L — ABNORMAL LOW
Anion Gap: 7 (ref 7–16)
BUN: 13 mg/dL (ref 7–18)
Bilirubin,Total: 0.2 mg/dL (ref 0.2–1.0)
CALCIUM: 9.1 mg/dL (ref 8.5–10.1)
CHLORIDE: 98 mmol/L (ref 98–107)
CO2: 29 mmol/L (ref 21–32)
Creatinine: 0.91 mg/dL (ref 0.60–1.30)
EGFR (African American): >60
EGFR (Non-African Amer.): >60
Glucose: 82 mg/dL (ref 65–99)
Osmolality: 267 (ref 275–301)
POTASSIUM: 3.7 mmol/L (ref 3.5–5.1)
SGPT (ALT): 12 U/L — ABNORMAL LOW
Sodium: 134 mmol/L — ABNORMAL LOW (ref 136–145)
Total Protein: 7.3 g/dL (ref 6.4–8.2)

## 2013-11-04 LAB — CBC CANCER CENTER
Basophil #: 0 x10 3/mm (ref 0.0–0.1)
Basophil %: 0.5 %
EOS ABS: 0.1 x10 3/mm (ref 0.0–0.7)
EOS PCT: 3.5 %
HCT: 27.8 % — AB (ref 40.0–52.0)
HGB: 8.7 g/dL — ABNORMAL LOW (ref 13.0–18.0)
LYMPHS ABS: 0.6 x10 3/mm — AB (ref 1.0–3.6)
LYMPHS PCT: 15.9 %
MCH: 31.9 pg (ref 26.0–34.0)
MCHC: 31.2 g/dL — ABNORMAL LOW (ref 32.0–36.0)
MCV: 102 fL — AB (ref 80–100)
Monocyte #: 0.3 x10 3/mm (ref 0.2–1.0)
Monocyte %: 6.6 %
Neutrophil #: 2.9 x10 3/mm (ref 1.4–6.5)
Neutrophil %: 73.5 %
Platelet: 262 x10 3/mm (ref 150–440)
RBC: 2.72 10*6/uL — ABNORMAL LOW (ref 4.40–5.90)
RDW: 17.1 % — ABNORMAL HIGH (ref 11.5–14.5)
WBC: 3.9 x10 3/mm (ref 3.8–10.6)

## 2013-11-07 LAB — CBC CANCER CENTER
BASOS ABS: 0 x10 3/mm (ref 0.0–0.1)
BASOS PCT: 0.3 %
Eosinophil #: 0 x10 3/mm (ref 0.0–0.7)
Eosinophil %: 0 %
HCT: 30.8 % — AB (ref 40.0–52.0)
HGB: 10 g/dL — AB (ref 13.0–18.0)
Lymphocyte #: 0.5 x10 3/mm — ABNORMAL LOW (ref 1.0–3.6)
Lymphocyte %: 10.8 %
MCH: 33.3 pg (ref 26.0–34.0)
MCHC: 32.5 g/dL (ref 32.0–36.0)
MCV: 102 fL — ABNORMAL HIGH (ref 80–100)
MONOS PCT: 2.6 %
Monocyte #: 0.1 x10 3/mm — ABNORMAL LOW (ref 0.2–1.0)
NEUTROS ABS: 3.9 x10 3/mm (ref 1.4–6.5)
NEUTROS PCT: 86.3 %
Platelet: 252 x10 3/mm (ref 150–440)
RBC: 3.01 10*6/uL — AB (ref 4.40–5.90)
RDW: 17 % — ABNORMAL HIGH (ref 11.5–14.5)
WBC: 4.5 x10 3/mm (ref 3.8–10.6)

## 2013-11-11 LAB — CBC CANCER CENTER
BASOS PCT: 0.2 %
Basophil #: 0 x10 3/mm (ref 0.0–0.1)
EOS ABS: 0 x10 3/mm (ref 0.0–0.7)
Eosinophil %: 1.2 %
HCT: 31.6 % — ABNORMAL LOW (ref 40.0–52.0)
HGB: 10 g/dL — ABNORMAL LOW (ref 13.0–18.0)
Lymphocyte #: 0.7 x10 3/mm — ABNORMAL LOW (ref 1.0–3.6)
Lymphocyte %: 21.1 %
MCH: 32.2 pg (ref 26.0–34.0)
MCHC: 31.7 g/dL — AB (ref 32.0–36.0)
MCV: 102 fL — ABNORMAL HIGH (ref 80–100)
MONO ABS: 0.4 x10 3/mm (ref 0.2–1.0)
Monocyte %: 13.1 %
NEUTROS ABS: 2.1 x10 3/mm (ref 1.4–6.5)
Neutrophil %: 64.4 %
Platelet: 142 x10 3/mm — ABNORMAL LOW (ref 150–440)
RBC: 3.11 10*6/uL — ABNORMAL LOW (ref 4.40–5.90)
RDW: 17.5 % — AB (ref 11.5–14.5)
WBC: 3.3 x10 3/mm — ABNORMAL LOW (ref 3.8–10.6)

## 2013-11-11 LAB — BASIC METABOLIC PANEL
ANION GAP: 5 — AB (ref 7–16)
BUN: 16 mg/dL (ref 7–18)
CHLORIDE: 99 mmol/L (ref 98–107)
CO2: 30 mmol/L (ref 21–32)
Calcium, Total: 8.8 mg/dL (ref 8.5–10.1)
Creatinine: 1.28 mg/dL (ref 0.60–1.30)
EGFR (Non-African Amer.): >60
Glucose: 114 mg/dL — ABNORMAL HIGH (ref 65–99)
Osmolality: 270 (ref 275–301)
Potassium: 3.7 mmol/L (ref 3.5–5.1)
Sodium: 134 mmol/L — ABNORMAL LOW (ref 136–145)

## 2013-11-11 LAB — HEPATIC FUNCTION PANEL A (ARMC)
ALBUMIN: 2.4 g/dL — AB (ref 3.4–5.0)
ALK PHOS: 40 U/L — AB
ALT: 14 U/L
AST: 13 U/L — AB (ref 15–37)
Bilirubin, Direct: 0.1 mg/dL (ref 0.0–0.2)
Bilirubin,Total: 0.3 mg/dL (ref 0.2–1.0)
TOTAL PROTEIN: 8.1 g/dL (ref 6.4–8.2)

## 2013-11-14 LAB — CBC CANCER CENTER
BASOS ABS: 0 x10 3/mm (ref 0.0–0.1)
Basophil %: 0.3 %
Eosinophil #: 0 x10 3/mm (ref 0.0–0.7)
Eosinophil %: 0.1 %
HCT: 30.4 % — ABNORMAL LOW (ref 40.0–52.0)
HGB: 10 g/dL — ABNORMAL LOW (ref 13.0–18.0)
LYMPHS ABS: 0.7 x10 3/mm — AB (ref 1.0–3.6)
Lymphocyte %: 15.1 %
MCH: 32.7 pg (ref 26.0–34.0)
MCHC: 32.9 g/dL (ref 32.0–36.0)
MCV: 99 fL (ref 80–100)
Monocyte #: 0.7 x10 3/mm (ref 0.2–1.0)
Monocyte %: 14.5 %
Neutrophil #: 3.4 x10 3/mm (ref 1.4–6.5)
Neutrophil %: 70 %
Platelet: 134 x10 3/mm — ABNORMAL LOW (ref 150–440)
RBC: 3.06 10*6/uL — ABNORMAL LOW (ref 4.40–5.90)
RDW: 17.2 % — AB (ref 11.5–14.5)
WBC: 4.8 x10 3/mm (ref 3.8–10.6)

## 2013-11-18 LAB — CBC CANCER CENTER
Basophil #: 0 x10 3/mm (ref 0.0–0.1)
Basophil %: 0.2 %
Eosinophil #: 0 x10 3/mm (ref 0.0–0.7)
Eosinophil %: 1.2 %
HCT: 34.1 % — ABNORMAL LOW (ref 40.0–52.0)
HGB: 10.9 g/dL — ABNORMAL LOW (ref 13.0–18.0)
Lymphocyte #: 0.7 x10 3/mm — ABNORMAL LOW (ref 1.0–3.6)
Lymphocyte %: 17.5 %
MCH: 32 pg (ref 26.0–34.0)
MCHC: 31.9 g/dL — ABNORMAL LOW (ref 32.0–36.0)
MCV: 100 fL (ref 80–100)
MONOS PCT: 10.8 %
Monocyte #: 0.4 x10 3/mm (ref 0.2–1.0)
Neutrophil #: 2.8 x10 3/mm (ref 1.4–6.5)
Neutrophil %: 70.3 %
Platelet: 136 x10 3/mm — ABNORMAL LOW (ref 150–440)
RBC: 3.4 10*6/uL — AB (ref 4.40–5.90)
RDW: 17.6 % — ABNORMAL HIGH (ref 11.5–14.5)
WBC: 4 x10 3/mm (ref 3.8–10.6)

## 2013-11-23 ENCOUNTER — Ambulatory Visit: Payer: Self-pay | Admitting: Internal Medicine

## 2013-11-23 ENCOUNTER — Ambulatory Visit: Payer: Self-pay | Admitting: Oncology

## 2013-11-25 LAB — CBC CANCER CENTER
BASOS ABS: 0 x10 3/mm (ref 0.0–0.1)
Basophil %: 0.3 %
Eosinophil #: 0.2 x10 3/mm (ref 0.0–0.7)
Eosinophil %: 3.5 %
HCT: 31 % — ABNORMAL LOW (ref 40.0–52.0)
HGB: 10.2 g/dL — AB (ref 13.0–18.0)
Lymphocyte #: 0.7 x10 3/mm — ABNORMAL LOW (ref 1.0–3.6)
Lymphocyte %: 11.1 %
MCH: 32.8 pg (ref 26.0–34.0)
MCHC: 32.8 g/dL (ref 32.0–36.0)
MCV: 100 fL (ref 80–100)
MONO ABS: 0.4 x10 3/mm (ref 0.2–1.0)
Monocyte %: 5.9 %
NEUTROS ABS: 5 x10 3/mm (ref 1.4–6.5)
Neutrophil %: 79.2 %
PLATELETS: 357 x10 3/mm (ref 150–440)
RBC: 3.1 10*6/uL — ABNORMAL LOW (ref 4.40–5.90)
RDW: 17.2 % — ABNORMAL HIGH (ref 11.5–14.5)
WBC: 6.3 x10 3/mm (ref 3.8–10.6)

## 2013-11-25 LAB — BASIC METABOLIC PANEL
Anion Gap: 5 — ABNORMAL LOW (ref 7–16)
BUN: 13 mg/dL (ref 7–18)
CALCIUM: 8.4 mg/dL — AB (ref 8.5–10.1)
Chloride: 100 mmol/L (ref 98–107)
Co2: 29 mmol/L (ref 21–32)
Creatinine: 1.06 mg/dL (ref 0.60–1.30)
Glucose: 83 mg/dL (ref 65–99)
Osmolality: 267 (ref 275–301)
POTASSIUM: 4 mmol/L (ref 3.5–5.1)
Sodium: 134 mmol/L — ABNORMAL LOW (ref 136–145)

## 2013-11-25 LAB — HEPATIC FUNCTION PANEL A (ARMC)
ALBUMIN: 2.5 g/dL — AB (ref 3.4–5.0)
ALT: 11 U/L — AB
Alkaline Phosphatase: 42 U/L — ABNORMAL LOW
BILIRUBIN DIRECT: 0.1 mg/dL (ref 0.0–0.2)
Bilirubin,Total: 0.3 mg/dL (ref 0.2–1.0)
SGOT(AST): 12 U/L — ABNORMAL LOW (ref 15–37)
TOTAL PROTEIN: 7.4 g/dL (ref 6.4–8.2)

## 2013-11-28 LAB — CBC CANCER CENTER
Basophil #: 0 x10 3/mm (ref 0.0–0.1)
Basophil %: 0.5 %
Eosinophil #: 0 x10 3/mm (ref 0.0–0.7)
Eosinophil %: 0.5 %
HCT: 33.8 % — ABNORMAL LOW (ref 40.0–52.0)
HGB: 10.8 g/dL — AB (ref 13.0–18.0)
Lymphocyte #: 0.4 x10 3/mm — ABNORMAL LOW (ref 1.0–3.6)
Lymphocyte %: 6 %
MCH: 32.1 pg (ref 26.0–34.0)
MCHC: 32 g/dL (ref 32.0–36.0)
MCV: 100 fL (ref 80–100)
MONO ABS: 0.2 x10 3/mm (ref 0.2–1.0)
MONOS PCT: 3.8 %
NEUTROS PCT: 89.2 %
Neutrophil #: 5.3 x10 3/mm (ref 1.4–6.5)
Platelet: 308 x10 3/mm (ref 150–440)
RBC: 3.37 10*6/uL — AB (ref 4.40–5.90)
RDW: 17.1 % — ABNORMAL HIGH (ref 11.5–14.5)
WBC: 5.9 x10 3/mm (ref 3.8–10.6)

## 2013-12-02 LAB — CBC CANCER CENTER
Basophil #: 0 x10 3/mm (ref 0.0–0.1)
Basophil %: 0.7 %
Eosinophil #: 0.3 x10 3/mm (ref 0.0–0.7)
Eosinophil %: 7 %
HCT: 32.8 % — AB (ref 40.0–52.0)
HGB: 10.6 g/dL — ABNORMAL LOW (ref 13.0–18.0)
LYMPHS PCT: 14.8 %
Lymphocyte #: 0.7 x10 3/mm — ABNORMAL LOW (ref 1.0–3.6)
MCH: 32.1 pg (ref 26.0–34.0)
MCHC: 32.4 g/dL (ref 32.0–36.0)
MCV: 99 fL (ref 80–100)
MONO ABS: 0.4 x10 3/mm (ref 0.2–1.0)
Monocyte %: 8.4 %
NEUTROS ABS: 3.4 x10 3/mm (ref 1.4–6.5)
Neutrophil %: 69.1 %
Platelet: 173 x10 3/mm (ref 150–440)
RBC: 3.3 10*6/uL — ABNORMAL LOW (ref 4.40–5.90)
RDW: 17 % — AB (ref 11.5–14.5)
WBC: 4.9 x10 3/mm (ref 3.8–10.6)

## 2013-12-05 LAB — CBC CANCER CENTER
BASOS PCT: 0.7 %
Basophil #: 0 x10 3/mm (ref 0.0–0.1)
EOS ABS: 0 x10 3/mm (ref 0.0–0.7)
Eosinophil %: 0.8 %
HCT: 32.8 % — ABNORMAL LOW (ref 40.0–52.0)
HGB: 10.7 g/dL — AB (ref 13.0–18.0)
LYMPHS ABS: 0.3 x10 3/mm — AB (ref 1.0–3.6)
Lymphocyte %: 11.5 %
MCH: 31.8 pg (ref 26.0–34.0)
MCHC: 32.5 g/dL (ref 32.0–36.0)
MCV: 98 fL (ref 80–100)
MONOS PCT: 2.9 %
Monocyte #: 0.1 x10 3/mm — ABNORMAL LOW (ref 0.2–1.0)
Neutrophil #: 2.4 x10 3/mm (ref 1.4–6.5)
Neutrophil %: 84.1 %
PLATELETS: 85 x10 3/mm — AB (ref 150–440)
RBC: 3.36 10*6/uL — AB (ref 4.40–5.90)
RDW: 17.4 % — AB (ref 11.5–14.5)
WBC: 2.8 x10 3/mm — AB (ref 3.8–10.6)

## 2013-12-08 LAB — PROT IMMUNOELECTROPHORES(ARMC)

## 2013-12-08 LAB — KAPPA/LAMBDA FREE LIGHT CHAINS (ARMC)

## 2013-12-12 LAB — CBC CANCER CENTER
BASOS ABS: 0 x10 3/mm (ref 0.0–0.1)
Basophil %: 0.7 %
EOS ABS: 0.1 x10 3/mm (ref 0.0–0.7)
EOS PCT: 1.2 %
HCT: 32 % — AB (ref 40.0–52.0)
HGB: 10.1 g/dL — ABNORMAL LOW (ref 13.0–18.0)
LYMPHS ABS: 0.6 x10 3/mm — AB (ref 1.0–3.6)
Lymphocyte %: 11.9 %
MCH: 31.3 pg (ref 26.0–34.0)
MCHC: 31.4 g/dL — ABNORMAL LOW (ref 32.0–36.0)
MCV: 100 fL (ref 80–100)
MONOS PCT: 10.3 %
Monocyte #: 0.6 x10 3/mm (ref 0.2–1.0)
NEUTROS ABS: 4.1 x10 3/mm (ref 1.4–6.5)
NEUTROS PCT: 75.9 %
Platelet: 190 x10 3/mm (ref 150–440)
RBC: 3.22 10*6/uL — ABNORMAL LOW (ref 4.40–5.90)
RDW: 17.4 % — ABNORMAL HIGH (ref 11.5–14.5)
WBC: 5.4 x10 3/mm (ref 3.8–10.6)

## 2013-12-16 LAB — BASIC METABOLIC PANEL
Anion Gap: 6 — ABNORMAL LOW (ref 7–16)
BUN: 15 mg/dL (ref 7–18)
CHLORIDE: 99 mmol/L (ref 98–107)
Calcium, Total: 8.4 mg/dL — ABNORMAL LOW (ref 8.5–10.1)
Co2: 28 mmol/L (ref 21–32)
Creatinine: 0.82 mg/dL (ref 0.60–1.30)
EGFR (African American): >60
EGFR (Non-African Amer.): >60
GLUCOSE: 113 mg/dL — AB (ref 65–99)
OSMOLALITY: 268 (ref 275–301)
Potassium: 4.1 mmol/L (ref 3.5–5.1)
Sodium: 133 mmol/L — ABNORMAL LOW (ref 136–145)

## 2013-12-16 LAB — CBC CANCER CENTER
BASOS PCT: 0.2 %
Basophil #: 0 x10 3/mm (ref 0.0–0.1)
Eosinophil #: 0 x10 3/mm (ref 0.0–0.7)
Eosinophil %: 0 %
HCT: 30.8 % — ABNORMAL LOW (ref 40.0–52.0)
HGB: 9.9 g/dL — AB (ref 13.0–18.0)
Lymphocyte #: 0.8 x10 3/mm — ABNORMAL LOW (ref 1.0–3.6)
Lymphocyte %: 8.6 %
MCH: 31.7 pg (ref 26.0–34.0)
MCHC: 32.1 g/dL (ref 32.0–36.0)
MCV: 99 fL (ref 80–100)
MONO ABS: 1.2 x10 3/mm — AB (ref 0.2–1.0)
MONOS PCT: 13.7 %
NEUTROS PCT: 77.5 %
Neutrophil #: 7 x10 3/mm — ABNORMAL HIGH (ref 1.4–6.5)
Platelet: 466 x10 3/mm — ABNORMAL HIGH (ref 150–440)
RBC: 3.12 10*6/uL — ABNORMAL LOW (ref 4.40–5.90)
RDW: 17.5 % — AB (ref 11.5–14.5)
WBC: 9 x10 3/mm (ref 3.8–10.6)

## 2013-12-16 LAB — HEPATIC FUNCTION PANEL A (ARMC)
ALK PHOS: 49 U/L
ALT: 13 U/L — AB
Albumin: 2.7 g/dL — ABNORMAL LOW (ref 3.4–5.0)
BILIRUBIN DIRECT: 0.1 mg/dL (ref 0.0–0.2)
Bilirubin,Total: 0.2 mg/dL (ref 0.2–1.0)
SGOT(AST): 10 U/L — ABNORMAL LOW (ref 15–37)
Total Protein: 6.6 g/dL (ref 6.4–8.2)

## 2013-12-23 ENCOUNTER — Ambulatory Visit: Payer: Self-pay | Admitting: Internal Medicine

## 2013-12-23 ENCOUNTER — Ambulatory Visit: Payer: Self-pay | Admitting: Oncology

## 2013-12-23 LAB — CBC CANCER CENTER
BASOS PCT: 0.2 %
Basophil #: 0 x10 3/mm (ref 0.0–0.1)
EOS PCT: 1.1 %
Eosinophil #: 0.1 x10 3/mm (ref 0.0–0.7)
HCT: 33.6 % — ABNORMAL LOW (ref 40.0–52.0)
HGB: 10.7 g/dL — ABNORMAL LOW (ref 13.0–18.0)
LYMPHS ABS: 0.5 x10 3/mm — AB (ref 1.0–3.6)
Lymphocyte %: 4.5 %
MCH: 31.3 pg (ref 26.0–34.0)
MCHC: 31.9 g/dL — ABNORMAL LOW (ref 32.0–36.0)
MCV: 98 fL (ref 80–100)
Monocyte #: 0.7 x10 3/mm (ref 0.2–1.0)
Monocyte %: 6.5 %
Neutrophil #: 10 x10 3/mm — ABNORMAL HIGH (ref 1.4–6.5)
Neutrophil %: 87.7 %
PLATELETS: 303 x10 3/mm (ref 150–440)
RBC: 3.42 10*6/uL — ABNORMAL LOW (ref 4.40–5.90)
RDW: 17.8 % — ABNORMAL HIGH (ref 11.5–14.5)
WBC: 11.4 x10 3/mm — ABNORMAL HIGH (ref 3.8–10.6)

## 2013-12-23 LAB — CREATININE, SERUM
Creatinine: 0.81 mg/dL (ref 0.60–1.30)
EGFR (African American): >60

## 2013-12-26 LAB — CBC CANCER CENTER
Basophil #: 0.1 x10 3/mm (ref 0.0–0.1)
Basophil %: 0.7 %
EOS PCT: 2.5 %
Eosinophil #: 0.2 x10 3/mm (ref 0.0–0.7)
HCT: 32.3 % — AB (ref 40.0–52.0)
HGB: 10.3 g/dL — ABNORMAL LOW (ref 13.0–18.0)
LYMPHS ABS: 0.8 x10 3/mm — AB (ref 1.0–3.6)
LYMPHS PCT: 9.8 %
MCH: 30.9 pg (ref 26.0–34.0)
MCHC: 32 g/dL (ref 32.0–36.0)
MCV: 97 fL (ref 80–100)
Monocyte #: 0.8 x10 3/mm (ref 0.2–1.0)
Monocyte %: 9.3 %
NEUTROS PCT: 77.7 %
Neutrophil #: 6.5 x10 3/mm (ref 1.4–6.5)
PLATELETS: 212 x10 3/mm (ref 150–440)
RBC: 3.35 10*6/uL — AB (ref 4.40–5.90)
RDW: 17.5 % — ABNORMAL HIGH (ref 11.5–14.5)
WBC: 8.4 x10 3/mm (ref 3.8–10.6)

## 2013-12-26 LAB — CREATININE, SERUM
Creatinine: 0.76 mg/dL (ref 0.60–1.30)
EGFR (African American): >60
EGFR (Non-African Amer.): >60

## 2013-12-30 LAB — CREATININE, SERUM
CREATININE: 0.88 mg/dL (ref 0.60–1.30)
EGFR (Non-African Amer.): >60

## 2013-12-30 LAB — CBC CANCER CENTER
BASOS ABS: 0 x10 3/mm (ref 0.0–0.1)
Basophil %: 0.7 %
Eosinophil #: 0.2 x10 3/mm (ref 0.0–0.7)
Eosinophil %: 2.7 %
HCT: 31.7 % — AB (ref 40.0–52.0)
HGB: 10.1 g/dL — AB (ref 13.0–18.0)
Lymphocyte #: 0.7 x10 3/mm — ABNORMAL LOW (ref 1.0–3.6)
Lymphocyte %: 12.1 %
MCH: 30.6 pg (ref 26.0–34.0)
MCHC: 31.8 g/dL — AB (ref 32.0–36.0)
MCV: 96 fL (ref 80–100)
MONOS PCT: 7.6 %
Monocyte #: 0.5 x10 3/mm (ref 0.2–1.0)
NEUTROS ABS: 4.6 x10 3/mm (ref 1.4–6.5)
Neutrophil %: 76.9 %
Platelet: 131 x10 3/mm — ABNORMAL LOW (ref 150–440)
RBC: 3.3 10*6/uL — ABNORMAL LOW (ref 4.40–5.90)
RDW: 17.3 % — ABNORMAL HIGH (ref 11.5–14.5)
WBC: 6 x10 3/mm (ref 3.8–10.6)

## 2014-01-06 LAB — CBC CANCER CENTER
Basophil #: 0 x10 3/mm (ref 0.0–0.1)
Basophil %: 0.2 %
Eosinophil #: 0.1 x10 3/mm (ref 0.0–0.7)
Eosinophil %: 1.5 %
HCT: 30 % — ABNORMAL LOW (ref 40.0–52.0)
HGB: 9.7 g/dL — ABNORMAL LOW (ref 13.0–18.0)
Lymphocyte #: 0.2 x10 3/mm — ABNORMAL LOW (ref 1.0–3.6)
Lymphocyte %: 3.7 %
MCH: 30.8 pg (ref 26.0–34.0)
MCHC: 32.5 g/dL (ref 32.0–36.0)
MCV: 95 fL (ref 80–100)
Monocyte #: 0.5 x10 3/mm (ref 0.2–1.0)
Monocyte %: 7.9 %
Neutrophil #: 5.7 x10 3/mm (ref 1.4–6.5)
Neutrophil %: 86.7 %
Platelet: 250 x10 3/mm (ref 150–440)
RBC: 3.16 10*6/uL — ABNORMAL LOW (ref 4.40–5.90)
RDW: 17.3 % — ABNORMAL HIGH (ref 11.5–14.5)
WBC: 6.6 x10 3/mm (ref 3.8–10.6)

## 2014-01-06 LAB — BASIC METABOLIC PANEL
Anion Gap: 10 (ref 7–16)
BUN: 13 mg/dL (ref 7–18)
CALCIUM: 7.6 mg/dL — AB (ref 8.5–10.1)
Chloride: 101 mmol/L (ref 98–107)
Co2: 25 mmol/L (ref 21–32)
Creatinine: 0.87 mg/dL (ref 0.60–1.30)
EGFR (Non-African Amer.): >60
Glucose: 124 mg/dL — ABNORMAL HIGH (ref 65–99)
Osmolality: 273 (ref 275–301)
POTASSIUM: 3.1 mmol/L — AB (ref 3.5–5.1)
Sodium: 136 mmol/L (ref 136–145)

## 2014-01-06 LAB — HEPATIC FUNCTION PANEL A (ARMC)
Albumin: 2.4 g/dL — ABNORMAL LOW (ref 3.4–5.0)
Alkaline Phosphatase: 45 U/L — ABNORMAL LOW
Bilirubin, Direct: 0.1 mg/dL (ref 0.0–0.2)
Bilirubin,Total: 0.3 mg/dL (ref 0.2–1.0)
SGOT(AST): 8 U/L — ABNORMAL LOW (ref 15–37)
SGPT (ALT): 11 U/L — ABNORMAL LOW
Total Protein: 5.8 g/dL — ABNORMAL LOW (ref 6.4–8.2)

## 2014-01-09 LAB — CBC CANCER CENTER
Basophil #: 0.1 x10 3/mm (ref 0.0–0.1)
Basophil %: 1.1 %
EOS PCT: 2.2 %
Eosinophil #: 0.2 x10 3/mm (ref 0.0–0.7)
HCT: 33.2 % — ABNORMAL LOW (ref 40.0–52.0)
HGB: 10.7 g/dL — ABNORMAL LOW (ref 13.0–18.0)
LYMPHS ABS: 0.4 x10 3/mm — AB (ref 1.0–3.6)
Lymphocyte %: 6 %
MCH: 30.6 pg (ref 26.0–34.0)
MCHC: 32.3 g/dL (ref 32.0–36.0)
MCV: 95 fL (ref 80–100)
MONO ABS: 0.8 x10 3/mm (ref 0.2–1.0)
Monocyte %: 10.6 %
Neutrophil #: 6 x10 3/mm (ref 1.4–6.5)
Neutrophil %: 80.1 %
Platelet: 277 x10 3/mm (ref 150–440)
RBC: 3.5 10*6/uL — ABNORMAL LOW (ref 4.40–5.90)
RDW: 17.1 % — ABNORMAL HIGH (ref 11.5–14.5)
WBC: 7.5 x10 3/mm (ref 3.8–10.6)

## 2014-01-12 LAB — CBC CANCER CENTER
BASOS ABS: 0.2 x10 3/mm — AB (ref 0.0–0.1)
Basophil %: 1.7 %
EOS PCT: 0.4 %
Eosinophil #: 0 x10 3/mm (ref 0.0–0.7)
HCT: 33.1 % — AB (ref 40.0–52.0)
HGB: 10.4 g/dL — ABNORMAL LOW (ref 13.0–18.0)
LYMPHS ABS: 0.2 x10 3/mm — AB (ref 1.0–3.6)
Lymphocyte %: 2.6 %
MCH: 29.9 pg (ref 26.0–34.0)
MCHC: 31.4 g/dL — ABNORMAL LOW (ref 32.0–36.0)
MCV: 95 fL (ref 80–100)
MONO ABS: 0.6 x10 3/mm (ref 0.2–1.0)
Monocyte %: 6.1 %
Neutrophil #: 8.1 x10 3/mm — ABNORMAL HIGH (ref 1.4–6.5)
Neutrophil %: 89.2 %
PLATELETS: 218 x10 3/mm (ref 150–440)
RBC: 3.48 10*6/uL — ABNORMAL LOW (ref 4.40–5.90)
RDW: 17.4 % — AB (ref 11.5–14.5)
WBC: 9.1 x10 3/mm (ref 3.8–10.6)

## 2014-01-14 ENCOUNTER — Inpatient Hospital Stay: Payer: Self-pay | Admitting: Hematology and Oncology

## 2014-01-14 LAB — COMPREHENSIVE METABOLIC PANEL
Albumin: 2.2 g/dL — ABNORMAL LOW (ref 3.4–5.0)
Alkaline Phosphatase: 54 U/L
Anion Gap: 5 — ABNORMAL LOW (ref 7–16)
BUN: 15 mg/dL (ref 7–18)
Bilirubin,Total: 0.1 mg/dL — ABNORMAL LOW (ref 0.2–1.0)
CHLORIDE: 104 mmol/L (ref 98–107)
Calcium, Total: 7.6 mg/dL — ABNORMAL LOW (ref 8.5–10.1)
Co2: 30 mmol/L (ref 21–32)
Creatinine: 1.08 mg/dL (ref 0.60–1.30)
EGFR (African American): >60
GLUCOSE: 104 mg/dL — AB (ref 65–99)
Osmolality: 279 (ref 275–301)
POTASSIUM: 3.6 mmol/L (ref 3.5–5.1)
SGOT(AST): 10 U/L — ABNORMAL LOW (ref 15–37)
SGPT (ALT): 12 U/L — ABNORMAL LOW
Sodium: 139 mmol/L (ref 136–145)
TOTAL PROTEIN: 6 g/dL — AB (ref 6.4–8.2)

## 2014-01-14 LAB — CBC CANCER CENTER
BASOS PCT: 1.6 %
Basophil #: 0.1 x10 3/mm (ref 0.0–0.1)
Eosinophil #: 0 x10 3/mm (ref 0.0–0.7)
Eosinophil %: 0.2 %
HCT: 29.4 % — ABNORMAL LOW (ref 40.0–52.0)
HGB: 9.3 g/dL — ABNORMAL LOW (ref 13.0–18.0)
LYMPHS ABS: 0.6 x10 3/mm — AB (ref 1.0–3.6)
Lymphocyte %: 12.2 %
MCH: 29.8 pg (ref 26.0–34.0)
MCHC: 31.7 g/dL — ABNORMAL LOW (ref 32.0–36.0)
MCV: 94 fL (ref 80–100)
MONO ABS: 0.9 x10 3/mm (ref 0.2–1.0)
Monocyte %: 19.9 %
Neutrophil #: 3.1 x10 3/mm (ref 1.4–6.5)
Neutrophil %: 66.1 %
PLATELETS: 135 x10 3/mm — AB (ref 150–440)
RBC: 3.13 10*6/uL — ABNORMAL LOW (ref 4.40–5.90)
RDW: 17.6 % — AB (ref 11.5–14.5)
WBC: 4.6 x10 3/mm (ref 3.8–10.6)

## 2014-01-14 LAB — CBC WITH DIFFERENTIAL/PLATELET
Basophil #: 0 10*3/uL (ref 0.0–0.1)
Basophil %: 0.2 %
Eosinophil #: 0 10*3/uL (ref 0.0–0.7)
Eosinophil %: 0.3 %
HCT: 30.9 % — ABNORMAL LOW (ref 40.0–52.0)
HGB: 9.9 g/dL — AB (ref 13.0–18.0)
LYMPHS PCT: 10.4 %
Lymphocyte #: 0.5 10*3/uL — ABNORMAL LOW (ref 1.0–3.6)
MCH: 30.7 pg (ref 26.0–34.0)
MCHC: 32 g/dL (ref 32.0–36.0)
MCV: 96 fL (ref 80–100)
MONO ABS: 1 x10 3/mm (ref 0.2–1.0)
MONOS PCT: 22 %
NEUTROS PCT: 67.1 %
Neutrophil #: 3.1 10*3/uL (ref 1.4–6.5)
PLATELETS: 133 10*3/uL — AB (ref 150–440)
RBC: 3.22 10*6/uL — AB (ref 4.40–5.90)
RDW: 17.3 % — ABNORMAL HIGH (ref 11.5–14.5)
WBC: 4.6 10*3/uL (ref 3.8–10.6)

## 2014-01-15 LAB — URINALYSIS, COMPLETE
BILIRUBIN, UR: NEGATIVE
BLOOD: NEGATIVE
GLUCOSE, UR: NEGATIVE mg/dL (ref 0–75)
Ketone: NEGATIVE
Leukocyte Esterase: NEGATIVE
Nitrite: NEGATIVE
PROTEIN: NEGATIVE
Ph: 7 (ref 4.5–8.0)
RBC,UR: NONE SEEN /HPF (ref 0–5)
Specific Gravity: 1.004 (ref 1.003–1.030)
Squamous Epithelial: NONE SEEN
WBC UR: NONE SEEN /HPF (ref 0–5)

## 2014-01-15 LAB — IRON AND TIBC
IRON: 18 ug/dL — AB (ref 65–175)
Iron Bind.Cap.(Total): 188 ug/dL — ABNORMAL LOW (ref 250–450)
Iron Saturation: 10 %
UNBOUND IRON-BIND. CAP.: 170 ug/dL

## 2014-01-15 LAB — RETICULOCYTES
Absolute Retic Count: 0.0252 10*6/uL (ref 0.019–0.186)
Reticulocyte: 0.85 % (ref 0.4–3.1)

## 2014-01-15 LAB — COMPREHENSIVE METABOLIC PANEL
ANION GAP: 6 — AB (ref 7–16)
Albumin: 1.9 g/dL — ABNORMAL LOW (ref 3.4–5.0)
Alkaline Phosphatase: 48 U/L
BILIRUBIN TOTAL: 0.2 mg/dL (ref 0.2–1.0)
BUN: 11 mg/dL (ref 7–18)
CHLORIDE: 105 mmol/L (ref 98–107)
CO2: 31 mmol/L (ref 21–32)
CREATININE: 1.02 mg/dL (ref 0.60–1.30)
Calcium, Total: 7.7 mg/dL — ABNORMAL LOW (ref 8.5–10.1)
EGFR (African American): >60
EGFR (Non-African Amer.): >60
GLUCOSE: 72 mg/dL (ref 65–99)
Osmolality: 281 (ref 275–301)
Potassium: 3.9 mmol/L (ref 3.5–5.1)
SGOT(AST): 12 U/L — ABNORMAL LOW (ref 15–37)
SGPT (ALT): 8 U/L — ABNORMAL LOW
Sodium: 142 mmol/L (ref 136–145)
Total Protein: 5.7 g/dL — ABNORMAL LOW (ref 6.4–8.2)

## 2014-01-15 LAB — FERRITIN: FERRITIN (ARMC): 1051 ng/mL — AB (ref 8–388)

## 2014-01-16 LAB — COMPREHENSIVE METABOLIC PANEL
ALBUMIN: 2.2 g/dL — AB (ref 3.4–5.0)
ALK PHOS: 51 U/L
ALT: 9 U/L — AB
AST: 11 U/L — AB (ref 15–37)
Anion Gap: 6 — ABNORMAL LOW (ref 7–16)
BUN: 19 mg/dL — ABNORMAL HIGH (ref 7–18)
Bilirubin,Total: 0.1 mg/dL — ABNORMAL LOW (ref 0.2–1.0)
Calcium, Total: 7.7 mg/dL — ABNORMAL LOW (ref 8.5–10.1)
Chloride: 105 mmol/L (ref 98–107)
Co2: 28 mmol/L (ref 21–32)
Creatinine: 0.88 mg/dL (ref 0.60–1.30)
EGFR (African American): >60
GLUCOSE: 130 mg/dL — AB (ref 65–99)
Osmolality: 282 (ref 275–301)
POTASSIUM: 4.7 mmol/L (ref 3.5–5.1)
Sodium: 139 mmol/L (ref 136–145)
TOTAL PROTEIN: 5.4 g/dL — AB (ref 6.4–8.2)

## 2014-01-16 LAB — FOLATE: Folic Acid: 12.3 ng/mL (ref 3.1–17.5)

## 2014-01-16 LAB — URINE CULTURE

## 2014-01-16 LAB — CBC WITH DIFFERENTIAL/PLATELET
BASOS ABS: 0.1 10*3/uL (ref 0.0–0.1)
Basophil %: 1.5 %
EOS ABS: 0 10*3/uL (ref 0.0–0.7)
Eosinophil %: 0 %
HCT: 28.3 % — AB (ref 40.0–52.0)
HGB: 9 g/dL — ABNORMAL LOW (ref 13.0–18.0)
Lymphocyte #: 0.4 10*3/uL — ABNORMAL LOW (ref 1.0–3.6)
Lymphocyte %: 5.5 %
MCH: 30.5 pg (ref 26.0–34.0)
MCHC: 31.9 g/dL — ABNORMAL LOW (ref 32.0–36.0)
MCV: 96 fL (ref 80–100)
Monocyte #: 0.6 x10 3/mm (ref 0.2–1.0)
Monocyte %: 7.8 %
NEUTROS ABS: 6.6 10*3/uL — AB (ref 1.4–6.5)
Neutrophil %: 85.2 %
Platelet: 104 10*3/uL — ABNORMAL LOW (ref 150–440)
RBC: 2.95 10*6/uL — AB (ref 4.40–5.90)
RDW: 16.9 % — AB (ref 11.5–14.5)
WBC: 7.7 10*3/uL (ref 3.8–10.6)

## 2014-01-17 LAB — CBC WITH DIFFERENTIAL/PLATELET
Basophil #: 0 10*3/uL (ref 0.0–0.1)
Basophil %: 0.2 %
EOS ABS: 0 10*3/uL (ref 0.0–0.7)
EOS PCT: 0.1 %
HCT: 28.6 % — AB (ref 40.0–52.0)
HGB: 9.2 g/dL — ABNORMAL LOW (ref 13.0–18.0)
LYMPHS ABS: 1 10*3/uL (ref 1.0–3.6)
Lymphocyte %: 9.8 %
MCH: 30.5 pg (ref 26.0–34.0)
MCHC: 32 g/dL (ref 32.0–36.0)
MCV: 95 fL (ref 80–100)
Monocyte #: 0.6 x10 3/mm (ref 0.2–1.0)
Monocyte %: 6.4 %
NEUTROS PCT: 83.5 %
Neutrophil #: 8.1 10*3/uL — ABNORMAL HIGH (ref 1.4–6.5)
Platelet: 148 10*3/uL — ABNORMAL LOW (ref 150–440)
RBC: 3 10*6/uL — AB (ref 4.40–5.90)
RDW: 17.5 % — ABNORMAL HIGH (ref 11.5–14.5)
WBC: 9.8 10*3/uL (ref 3.8–10.6)

## 2014-01-18 LAB — CBC WITH DIFFERENTIAL/PLATELET
Eosinophil: 1 %
HCT: 30.1 % — ABNORMAL LOW (ref 40.0–52.0)
HGB: 9.8 g/dL — ABNORMAL LOW (ref 13.0–18.0)
Lymphocytes: 17 %
MCH: 30.7 pg (ref 26.0–34.0)
MCHC: 32.5 g/dL (ref 32.0–36.0)
MCV: 94 fL (ref 80–100)
METAMYELOCYTE: 2 %
Monocytes: 7 %
Myelocyte: 1 %
PLATELETS: 191 10*3/uL (ref 150–440)
RBC: 3.18 10*6/uL — ABNORMAL LOW (ref 4.40–5.90)
RDW: 17 % — ABNORMAL HIGH (ref 11.5–14.5)
Segmented Neutrophils: 72 %
WBC: 4.5 10*3/uL (ref 3.8–10.6)

## 2014-01-19 LAB — CULTURE, BLOOD (SINGLE)

## 2014-01-19 LAB — EXPECTORATED SPUTUM ASSESSMENT W GRAM STAIN, RFLX TO RESP C

## 2014-01-20 LAB — PROT IMMUNOELECTROPHORES(ARMC)

## 2014-01-23 ENCOUNTER — Ambulatory Visit: Payer: Self-pay | Admitting: Internal Medicine

## 2014-01-23 ENCOUNTER — Ambulatory Visit: Payer: Self-pay | Admitting: Hematology and Oncology

## 2014-01-27 LAB — CBC CANCER CENTER
BASOS PCT: 0.6 %
Basophil #: 0 x10 3/mm (ref 0.0–0.1)
EOS PCT: 3.2 %
Eosinophil #: 0.2 x10 3/mm (ref 0.0–0.7)
HCT: 30.4 % — ABNORMAL LOW (ref 40.0–52.0)
HGB: 9.8 g/dL — AB (ref 13.0–18.0)
LYMPHS ABS: 0.7 x10 3/mm — AB (ref 1.0–3.6)
Lymphocyte %: 9.6 %
MCH: 30.4 pg (ref 26.0–34.0)
MCHC: 32.3 g/dL (ref 32.0–36.0)
MCV: 94 fL (ref 80–100)
MONO ABS: 0.4 x10 3/mm (ref 0.2–1.0)
Monocyte %: 5 %
NEUTROS ABS: 6.1 x10 3/mm (ref 1.4–6.5)
Neutrophil %: 81.6 %
Platelet: 459 x10 3/mm — ABNORMAL HIGH (ref 150–440)
RBC: 3.24 10*6/uL — ABNORMAL LOW (ref 4.40–5.90)
RDW: 17.4 % — ABNORMAL HIGH (ref 11.5–14.5)
WBC: 7.5 x10 3/mm (ref 3.8–10.6)

## 2014-01-27 LAB — HEPATIC FUNCTION PANEL A (ARMC)
Albumin: 2.8 g/dL — ABNORMAL LOW (ref 3.4–5.0)
Alkaline Phosphatase: 51 U/L
Bilirubin, Direct: 0.1 mg/dL (ref 0.0–0.2)
Bilirubin,Total: 0.3 mg/dL (ref 0.2–1.0)
SGOT(AST): 8 U/L — ABNORMAL LOW (ref 15–37)
SGPT (ALT): 11 U/L — ABNORMAL LOW
Total Protein: 6.7 g/dL (ref 6.4–8.2)

## 2014-01-27 LAB — BASIC METABOLIC PANEL
Anion Gap: 7 (ref 7–16)
BUN: 10 mg/dL (ref 7–18)
CHLORIDE: 100 mmol/L (ref 98–107)
CO2: 30 mmol/L (ref 21–32)
CREATININE: 0.86 mg/dL (ref 0.60–1.30)
Calcium, Total: 8 mg/dL — ABNORMAL LOW (ref 8.5–10.1)
EGFR (Non-African Amer.): >60
Glucose: 78 mg/dL (ref 65–99)
OSMOLALITY: 272 (ref 275–301)
Potassium: 4.2 mmol/L (ref 3.5–5.1)
Sodium: 137 mmol/L (ref 136–145)

## 2014-01-30 LAB — CBC CANCER CENTER
BASOS ABS: 0.1 x10 3/mm (ref 0.0–0.1)
Basophil %: 1.1 %
EOS ABS: 0.4 x10 3/mm (ref 0.0–0.7)
Eosinophil %: 5.6 %
HCT: 34 % — ABNORMAL LOW (ref 40.0–52.0)
HGB: 10.9 g/dL — ABNORMAL LOW (ref 13.0–18.0)
Lymphocyte #: 0.7 x10 3/mm — ABNORMAL LOW (ref 1.0–3.6)
Lymphocyte %: 10.1 %
MCH: 30.3 pg (ref 26.0–34.0)
MCHC: 32.1 g/dL (ref 32.0–36.0)
MCV: 94 fL (ref 80–100)
MONO ABS: 0.7 x10 3/mm (ref 0.2–1.0)
Monocyte %: 10.3 %
NEUTROS PCT: 72.9 %
Neutrophil #: 5.2 x10 3/mm (ref 1.4–6.5)
PLATELETS: 384 x10 3/mm (ref 150–440)
RBC: 3.6 10*6/uL — ABNORMAL LOW (ref 4.40–5.90)
RDW: 17.7 % — ABNORMAL HIGH (ref 11.5–14.5)
WBC: 7.1 x10 3/mm (ref 3.8–10.6)

## 2014-02-03 LAB — CBC CANCER CENTER
BASOS ABS: 0 x10 3/mm (ref 0.0–0.1)
Basophil %: 0.5 %
Eosinophil #: 0.2 x10 3/mm (ref 0.0–0.7)
Eosinophil %: 2.9 %
HCT: 30.8 % — ABNORMAL LOW (ref 40.0–52.0)
HGB: 9.9 g/dL — ABNORMAL LOW (ref 13.0–18.0)
Lymphocyte #: 0.6 x10 3/mm — ABNORMAL LOW (ref 1.0–3.6)
Lymphocyte %: 8.1 %
MCH: 30.7 pg (ref 26.0–34.0)
MCHC: 32.2 g/dL (ref 32.0–36.0)
MCV: 95 fL (ref 80–100)
Monocyte #: 1 x10 3/mm (ref 0.2–1.0)
Monocyte %: 13.7 %
Neutrophil #: 5.2 x10 3/mm (ref 1.4–6.5)
Neutrophil %: 74.8 %
PLATELETS: 220 x10 3/mm (ref 150–440)
RBC: 3.24 10*6/uL — ABNORMAL LOW (ref 4.40–5.90)
RDW: 17.8 % — AB (ref 11.5–14.5)
WBC: 7 x10 3/mm (ref 3.8–10.6)

## 2014-02-06 LAB — CBC CANCER CENTER
BASOS PCT: 0.4 %
Basophil #: 0 x10 3/mm (ref 0.0–0.1)
EOS ABS: 0.3 x10 3/mm (ref 0.0–0.7)
Eosinophil %: 4.7 %
HCT: 33.1 % — AB (ref 40.0–52.0)
HGB: 10.5 g/dL — AB (ref 13.0–18.0)
LYMPHS PCT: 10.1 %
Lymphocyte #: 0.6 x10 3/mm — ABNORMAL LOW (ref 1.0–3.6)
MCH: 30.4 pg (ref 26.0–34.0)
MCHC: 31.6 g/dL — ABNORMAL LOW (ref 32.0–36.0)
MCV: 96 fL (ref 80–100)
MONO ABS: 0.7 x10 3/mm (ref 0.2–1.0)
Monocyte %: 11.2 %
NEUTROS ABS: 4.4 x10 3/mm (ref 1.4–6.5)
NEUTROS PCT: 73.6 %
Platelet: 126 x10 3/mm — ABNORMAL LOW (ref 150–440)
RBC: 3.44 10*6/uL — ABNORMAL LOW (ref 4.40–5.90)
RDW: 17.7 % — ABNORMAL HIGH (ref 11.5–14.5)
WBC: 6 x10 3/mm (ref 3.8–10.6)

## 2014-02-17 LAB — CBC CANCER CENTER
Basophil #: 0 x10 3/mm (ref 0.0–0.1)
Basophil %: 0.5 %
EOS PCT: 0.1 %
Eosinophil #: 0 x10 3/mm (ref 0.0–0.7)
HCT: 34.2 % — ABNORMAL LOW (ref 40.0–52.0)
HGB: 11.1 g/dL — ABNORMAL LOW (ref 13.0–18.0)
LYMPHS PCT: 12.3 %
Lymphocyte #: 0.8 x10 3/mm — ABNORMAL LOW (ref 1.0–3.6)
MCH: 30.7 pg (ref 26.0–34.0)
MCHC: 32.6 g/dL (ref 32.0–36.0)
MCV: 94 fL (ref 80–100)
Monocyte #: 0.9 x10 3/mm (ref 0.2–1.0)
Monocyte %: 14.6 %
NEUTROS PCT: 72.5 %
Neutrophil #: 4.6 x10 3/mm (ref 1.4–6.5)
Platelet: 281 x10 3/mm (ref 150–440)
RBC: 3.62 10*6/uL — ABNORMAL LOW (ref 4.40–5.90)
RDW: 18.1 % — AB (ref 11.5–14.5)
WBC: 6.3 x10 3/mm (ref 3.8–10.6)

## 2014-02-17 LAB — HEPATIC FUNCTION PANEL A (ARMC)
ALBUMIN: 2.8 g/dL — AB (ref 3.4–5.0)
AST: 9 U/L — AB (ref 15–37)
Alkaline Phosphatase: 44 U/L — ABNORMAL LOW (ref 46–116)
Bilirubin, Direct: 0.1 mg/dL (ref 0.0–0.2)
Bilirubin,Total: 0.2 mg/dL (ref 0.2–1.0)
SGPT (ALT): 12 U/L — ABNORMAL LOW (ref 14–63)
Total Protein: 5.9 g/dL — ABNORMAL LOW (ref 6.4–8.2)

## 2014-02-17 LAB — BASIC METABOLIC PANEL
Anion Gap: 7 (ref 7–16)
BUN: 14 mg/dL (ref 7–18)
CALCIUM: 7.7 mg/dL — AB (ref 8.5–10.1)
CO2: 29 mmol/L (ref 21–32)
CREATININE: 0.81 mg/dL (ref 0.60–1.30)
Chloride: 102 mmol/L (ref 98–107)
EGFR (African American): >60
EGFR (Non-African Amer.): >60
GLUCOSE: 97 mg/dL (ref 65–99)
Osmolality: 276 (ref 275–301)
POTASSIUM: 3.3 mmol/L — AB (ref 3.5–5.1)
SODIUM: 138 mmol/L (ref 136–145)

## 2014-02-23 ENCOUNTER — Ambulatory Visit: Payer: Self-pay | Admitting: Oncology

## 2014-02-23 ENCOUNTER — Ambulatory Visit
Admit: 2014-02-23 | Disposition: A | Discharge status: Home / Self Care | Payer: Self-pay | Attending: Internal Medicine | Admitting: Internal Medicine

## 2014-02-23 ENCOUNTER — Ambulatory Visit: Payer: Self-pay | Admitting: Internal Medicine

## 2014-02-24 LAB — CBC CANCER CENTER
BASOS PCT: 0.4 %
Basophil #: 0 x10 3/mm (ref 0.0–0.1)
Eosinophil #: 0.2 x10 3/mm (ref 0.0–0.7)
Eosinophil %: 2.6 %
HCT: 32.9 % — ABNORMAL LOW (ref 40.0–52.0)
HGB: 10.8 g/dL — ABNORMAL LOW (ref 13.0–18.0)
LYMPHS ABS: 0.8 x10 3/mm — AB (ref 1.0–3.6)
LYMPHS PCT: 11.7 %
MCH: 31 pg (ref 26.0–34.0)
MCHC: 32.9 g/dL (ref 32.0–36.0)
MCV: 94 fL (ref 80–100)
Monocyte #: 0.4 x10 3/mm (ref 0.2–1.0)
Monocyte %: 6.2 %
Neutrophil #: 5.5 x10 3/mm (ref 1.4–6.5)
Neutrophil %: 79.1 %
Platelet: 206 x10 3/mm (ref 150–440)
RBC: 3.49 10*6/uL — AB (ref 4.40–5.90)
RDW: 17.7 % — AB (ref 11.5–14.5)
WBC: 6.9 x10 3/mm (ref 3.8–10.6)

## 2014-02-27 LAB — CBC CANCER CENTER
BASOS PCT: 0.2 %
Basophil #: 0 x10 3/mm (ref 0.0–0.1)
EOS PCT: 0.1 %
Eosinophil #: 0 x10 3/mm (ref 0.0–0.7)
HCT: 33.4 % — AB (ref 40.0–52.0)
HGB: 11 g/dL — AB (ref 13.0–18.0)
LYMPHS ABS: 0.3 x10 3/mm — AB (ref 1.0–3.6)
LYMPHS PCT: 4.3 %
MCH: 30.9 pg (ref 26.0–34.0)
MCHC: 32.9 g/dL (ref 32.0–36.0)
MCV: 94 fL (ref 80–100)
Monocyte #: 0.1 x10 3/mm — ABNORMAL LOW (ref 0.2–1.0)
Monocyte %: 1.6 %
Neutrophil #: 7.2 x10 3/mm — ABNORMAL HIGH (ref 1.4–6.5)
Neutrophil %: 93.8 %
Platelet: 175 x10 3/mm (ref 150–440)
RBC: 3.55 10*6/uL — ABNORMAL LOW (ref 4.40–5.90)
RDW: 17.5 % — AB (ref 11.5–14.5)
WBC: 7.7 x10 3/mm (ref 3.8–10.6)

## 2014-03-24 ENCOUNTER — Ambulatory Visit: Payer: Self-pay | Admitting: Internal Medicine

## 2014-03-24 ENCOUNTER — Ambulatory Visit
Admit: 2014-03-24 | Disposition: A | Discharge status: Home / Self Care | Payer: Self-pay | Attending: Hematology and Oncology | Admitting: Hematology and Oncology

## 2014-04-07 ENCOUNTER — Ambulatory Visit
Admit: 2014-04-07 | Disposition: A | Discharge status: Home / Self Care | Payer: Self-pay | Attending: Internal Medicine | Admitting: Internal Medicine

## 2014-04-21 LAB — HEMOGLOBIN: HGB: 12.7 g/dL — ABNORMAL LOW (ref 13.0–18.0)

## 2014-04-21 LAB — POTASSIUM: Potassium: 4 mmol/L

## 2014-04-24 ENCOUNTER — Ambulatory Visit
Admit: 2014-04-24 | Disposition: A | Discharge status: Home / Self Care | Payer: Self-pay | Attending: Hematology and Oncology | Admitting: Hematology and Oncology

## 2014-04-24 ENCOUNTER — Ambulatory Visit
Admit: 2014-04-24 | Disposition: A | Discharge status: Home / Self Care | Payer: Self-pay | Attending: Internal Medicine | Admitting: Internal Medicine

## 2014-05-12 LAB — CBC CANCER CENTER
BASOS ABS: 0.1 x10 3/mm (ref 0.0–0.1)
Basophil %: 1 %
Eosinophil #: 0.2 x10 3/mm (ref 0.0–0.7)
Eosinophil %: 3 %
HCT: 30.9 % — AB (ref 40.0–52.0)
HGB: 10.6 g/dL — ABNORMAL LOW (ref 13.0–18.0)
Lymphocyte #: 1.2 x10 3/mm (ref 1.0–3.6)
Lymphocyte %: 20.8 %
MCH: 31.7 pg (ref 26.0–34.0)
MCHC: 34.3 g/dL (ref 32.0–36.0)
MCV: 92 fL (ref 80–100)
Monocyte #: 0.7 x10 3/mm (ref 0.2–1.0)
Monocyte %: 12.7 %
Neutrophil #: 3.6 x10 3/mm (ref 1.4–6.5)
Neutrophil %: 62.5 %
Platelet: 258 x10 3/mm (ref 150–440)
RBC: 3.34 10*6/uL — AB (ref 4.40–5.90)
RDW: 16.5 % — AB (ref 11.5–14.5)
WBC: 5.7 x10 3/mm (ref 3.8–10.6)

## 2014-05-12 LAB — BASIC METABOLIC PANEL
ANION GAP: 7 (ref 7–16)
BUN: 9 mg/dL
CALCIUM: 8.8 mg/dL — AB
CHLORIDE: 101 mmol/L
Co2: 27 mmol/L
Creatinine: 0.89 mg/dL
EGFR (African American): >60
EGFR (Non-African Amer.): >60
Glucose: 77 mg/dL
Potassium: 3.3 mmol/L — ABNORMAL LOW
SODIUM: 135 mmol/L

## 2014-05-15 NOTE — Consult Note (Signed)
Brief Consult Note: Diagnosis: Elevated TNI, syncope.   Comments: Patient had numbness/tingling of hands, syncope this am and denies chest pain, chest pressure, SOB at this time. He has cardiomyopathy, last LVEF 37-38% and is s/p AICD. Elevated TNI could be from demand ischemia from dehydration/overexertion or bradycardia/hypotnesion as his last cardiac cath 06/29/2011 with normal coronaries. No evidence of significant carotid disease, CT of head with no acute changes, and echo has been ordered. Agree with cycling of CE, BB, statin, ACE-I, dig (doses have been reduced from his home med doses due to hypotension), and pt on heparin gtt. He was on warfarin as had LV thrombus which had resolved on echo done in November 2013 in our office.Will also arrange for AICD interrogation with [facility] to r/o arrhythmia as cause of syncope as patient was not debrillated.  Electronic Signatures: Angelica Ran (MD)   (Signed [id number] 08:38)  Co-Signer: Brief Consult Note Merla Riches (PA-C)   (Signed 07-May-14 16:17)  Authored: Brief Consult Note  Last Updated: 08-May-14 08:38 by Angelica Ran (MD)

## 2014-05-15 NOTE — H&P (Signed)
PATIENT NAME:  [name], [name] MR#:  [id number] DATE OF BIRTH:  1960/02/26  DATE OF ADMISSION:  05/29/2012  PRIMARY CARE PHYSICIAN: [facility].   CHIEF COMPLAINT: Passed out today.   HISTORY OF PRESENT ILLNESS: A 54 year old Caucasian male with a history of COPD, cardiomyopathy with ejection fraction 25%, polysubstance abuse, presented to the ED with syncope today. The patient is alert, awake, oriented, in no acute distress. The patient says he was going to take medications this morning, but feet dizzy and passed out. He lost consciousness, according to his wife. The patient also has some arm-shaking, jerking, but no incontinence. No headache or dizziness. No nausea, vomiting, diarrhea after syncope. The patient does feel some chest tightness and discomfort, but no chest pain, palpitations, orthopnea, or nocturnal dyspnea. No leg edema. The patient denies any urine problem. He has been doing yard work and got tick bite in the past week.  In the ED the patient was noted to have elevated troponin at 0.24.   PAST MEDICAL HISTORY: COPD, polysubstance abuse, cardiomyopathy with ejection fraction of 25% depression, anxiety and pneumonia.   SURGICAL HISTORY: None.  SOCIAL HISTORY: The patient used to smoke 2-1/2 packs a day, but has decreased to one-half  pack a day to 1 pack a day. Used marijuana yesterday. Drank half of a beer yesterday. The patient sometimes drinks alcohol.   FAMILY HISTORY: Mother had COPD. Father died of a heart attack in his 55s.   ALLERGIES: No.   HOME MEDICATIONS:  1.  Warfarin 8 mg p.o. 3 times a week and 7 mg 4 times a week.  2.  Ventolin HFA, CFC-free, 90 mcg inhalation, 2 puffs 4 times a day.  3.  Lisinopril 5 mg p.o. daily.  4.  Digoxin 125 mcg p.o. daily.  5.  Coreg 12.5 mg p.o. b.i.d.  6.  Advair 100 mcg/50 mcg inhalation powder, 1 puff b.i.d.   REVIEW OF SYSTEMS:  CONSTITUTIONAL: The patient denies any fever, chills. No headache, but has dizziness. No  weakness.  EYES: No double vision, blurry vision.  ENT: No postnasal drip, slurred speech or dysphagia. No epistaxis.  CARDIOVASCULAR: Chest tightness, but no orthopnea or nocturnal dyspnea. No leg edema.  PULMONARY: No cough, sputum, shortness of breath or hemoptysis.  GASTROINTESTINAL: No abdominal pain, nausea, vomiting, or diarrhea. No melena. No  bloody stool.  GENITOURINARY: No dysuria, hematuria or incontinence.  SKIN: No rash or jaundice.  HEMATOLOGY: No easy bruising or bleeding.  ENDOCRINOLOGIC: No polyuria, polydipsia, heat or cold intolerance.  NEUROLOGIC: Positive for syncope, loss of consciousness and questionable seizure.    VITAL SIGNS: Temperature 98.2, blood pressure 112/56, pulse 73, respirations 18. The patient's pulse was  41 two hours ago. Oxygen saturation 96% on room air.  GENERAL: The patient is alert, awake, oriented, in no acute distress.  HEENT: Pupils are equal and reactive to light and accommodation.  NECK: Supple. No JVD or carotid bruits. No lymphadenopathy. No thyromegaly. Moist oral mucosa. Clear oropharynx.  CARDIOVASCULAR: S1, S2, regular rate and rhythm. No murmurs or gallops.  PULMONARY: Bilateral air entry. No wheezing or rales. No use of accessory muscles to breathe.  ABDOMEN: Soft. No distention. No tenderness. No organomegaly. Bowel sounds present.  EXTREMITIES: No edema, clubbing, or cyanosis. No calf tenderness. Strong bilateral pedal pulses.  SKIN: No rash, jaundice.  NEUROLOGY: AAO x 3. No focal deficit. Power 5/5. Sensation intact.   LABORATORY DATA: CAT scan of head: Small-vessel microangiopathic changes reveal evidence of acute  abnormality.   BNP 297. CK 101, CK-MB 1.0.   WBC 5.0, hemoglobin 12.4, platelet 222, glucose 80, BUN 10, creatinine 0.9. Electrolytes are normal. Troponin 0.24. Urinalysis negative. INR 1.5.   CHEST X-RAY: No acute cardiopulmonary disease.   EKG shows sinus rhythm at 75 BPM, with occasional PVCs; PACs.    IMPRESSIONS: 1.  Syncope, unclear etiology, possibly due to non-ST wave elevation myocardial infarction, or bradycardia.  2.  Non-ST elevation myocardial infarction.  3.  Temporary bradycardia.  4.  Questionable seizure.  5.  Cardiomyopathy, with ejection fraction of 25%.  6.  Hypertension.  7.  Chronic obstructive pulmonary disease.  8.  Tobacco abuse.   9. Tick bite.  PLAN OF TREATMENT: 1.  The patient will be admitted to the telly floor. Will start aspirin and start heparin drip. Continue Coumadin. Follow up PT, PTT, INR. Follow up troponin level, lipid panel.  2.  Will get an echocardiogram, carotid duplex, and follow up with Dr. Humphrey Rolls.  3.  Continue lisinopril, Coreg, and a statin.  4.  For chronic obstructive pulmonary disease, we will continue Advair, Ventolin.  5.  Smoking cessation was counseled for 6 minutes.   I discussed the patient's condition and plan of treatment with the patient and the patient's wife.   THE PATIENT IS FULL CODE.   Time spent: About 55 minutes.    ____________________________ Demetrios Loll, MD qc:dm D: 05/29/2012 12:43:28 ET T: 05/29/2012 13:17:49 ET JOB#: [id number]  cc: Demetrios Loll, MD, <Dictator> Demetrios Loll MD ELECTRONICALLY SIGNED 05/29/2012 15:16

## 2014-05-15 NOTE — Consult Note (Signed)
PATIENT NAME:  [name], [name] MR#:  [id number] DATE OF BIRTH:  [birth year]  DATE OF CONSULTATION:  05/29/2012  REFERRING PHYSICIAN:  Demetrios Loll, MD CONSULTING PHYSICIAN:  Merla Riches, PA-C, for Dionisio David, MD PRIMARY CARE PHYSICIAN: [facility]   REASON FOR CONSULTATION: Non-ST elevation MI.   HISTORY OF PRESENT ILLNESS: [name] is a 54 year old white male who is known to our practice. He has a history of COPD and cardiomyopathy, status post AICD implantation. The patient woke up early this morning and after he drank his coffee, he noticed numbness and tingling in his hands. He went to get up from the chair and had a syncopal episode. He did not lose any bowel or bladder control, but did hit his head and obtained a laceration on the right arm. The patient denies any chest pain, chest pressure, heaviness, squeezing or shortness of breath at this time. He does have some orthopnea and tends to sleep on his side secondary to that and has had some recent coughing secondary to allergies. Denies any increased pedal edema. Of note, the patient states he has been working outside quite a bit doing gardening and other strenuous activities. Of note, the patient states he was never defibrillated during this event.   PAST MEDICAL HISTORY:  1.  History of atrial fibrillation.  2.  Cardiomyopathy, likely alcohol induced.  3.  Congestive heart failure, systolic.  4.  COPD.  5.  History of nicotine dependence.  6.  Cardiac catheterization 06/29/2011: Normal coronaries with a left ventricular ejection fraction of less than 10%.   PAST SURGICAL HISTORY: [place] placed on 08/22/2011 at [facility], [place], serial [phone].   ALLERGIES: No known drug allergies.   HOME MEDICATIONS:  1.  Advair Diskus 250/50, 5 mcg per dose as directed.  2.  Carvedilol 12.5 mg p.o. b.i.d.  3.  Digoxin 0.25 mg p.o. daily.  4.  Lisinopril 5 mg p.o. daily.  5.  Nitrostat 0.4  mg sublingually p.r.n. (patient states he has never had to use these).  6.  Warfarin 8 mg on Monday, Wednesday and Friday, 7 mg on Tuesday, Thursday, Saturday and Sunday.  7.  Simvastatin 40 mg 1 tablet p.o. at bedtime.   SOCIAL HISTORY: The patient smoked for over 30 years. He has reduced to 1/2 pack per day from 2-1/2 packs per day. Notes that he used some marijuana yesterday, drink half a beer yesterday. He had a history of heavy alcohol abuse in the past and a history of cocaine use.   FAMILY HISTORY:  1.  Heart disease, mother who has stents.  2.  Grandmother with pacemaker.  3.  Mother with COPD.  4.  Father died of a heart attack in his 58s.   REVIEW OF SYSTEMS:    CONSTITUTIONAL: The patient denies any fevers, chills, but has dizziness.  EYES: The patient denies blurry vision, double vision.  EARS, NOSE, THROAT: The patient complains of some allergy symptoms.  PULMONARY: The patient denies shortness of breath, has orthopnea.  CARDIOVASCULAR: The patient denies chest pain, chest pressure, palpitations.  GASTROINTESTINAL: The patient denies any nausea, vomiting, abdominal pain, bowel irregularities.   PHYSICAL EXAMINATION:  VITAL SIGNS: Temperature 97.4 degrees Fahrenheit, heart rate 69, respiratory rate 18, blood pressure 118/62, O2 sat 96% on room air.  HEENT: Head: Atraumatic, normocephalic. Eyes: Pupils are round and equal. There is no scleral icterus. Conjunctivae are pale, pink. Ears and nose: Normal to external  inspection. Mouth: Moist mucous membranes.  NECK: Supple. Trachea is midline. There are no carotid bruits appreciated.  RESPIRATORY: No accessory muscle use. Lungs are clear to auscultation. No adventitious breath sounds are appreciated.  CARDIOVASCULAR: Regular rate and rhythm with some ectopic beats noted. AICD implanted in the left upper chest.  ABDOMEN: Nondistended. Bowel sounds present in all 4 quadrants. It is soft to palpation.  EXTREMITIES: No cyanosis,  clubbing or edema.  SKIN: The patient has laceration on the right arm.   LABORATORY AND RADIOLOGICAL DATA: Glucose 82. BNP 297. Glucose 80, BUN 10, creatinine 0.90, sodium 137, potassium 4.1, chloride 107, CO2 of 27. Estimated GFR is greater than 60. Total protein is 9.6, albumin 3.2, total bilirubin 0.1, alkaline phosphatase 43, AST 21 and ALT 18. Total CK 101, CK-MB 1.0, troponin I 0.24. Urine drug screen is positive for cannabinoids. White blood cell count 5.0, hemoglobin 12.4, hematocrit 35.0, platelet count 222,000. PT 70.8, INR 1.5, PTT 43.8.   CT of the head: Mild vessel microangiopathic changes without evidence of acute abnormalities.   Carotid Doppler: Atherosclerotic disease without evidence of hemodynamically significant stenosis.   Chest x-ray: No acute cardiopulmonary disease evident.   ASSESSMENT AND PLAN:  1.  Elevated troponin I. The patient has elevated cardiac enzymes, but denies any chest pressure at this time. He had cardiac catheterization on 06/29/2011 and had normal coronaries at that time, however, has severe cardiomyopathy with estimated left ventricular ejection fraction by contrast ventriculography estimated at 15%. The patient has already been started on heparin nomogram, is on aspirin, warfarin, statin, beta blocker. We will continue to cycle cardiac enzymes.  2.  Cardiomyopathy. The patient has a history of cardiomyopathy thought to be alcohol induced. He is status post automatic implantable cardiac defibrillator placement. Last echocardiogram done on 12/18/2011 showed a left ventricular ejection fraction of 37%, global hypokinesis, mild diastolic dysfunction, mild to moderate tricuspid and mitral regurgitation, mildly dilated atria bilaterally, and a moderately dilated left ventricle. The patient did not have any defibrillations with his syncopal episodes, as his automatic implantable cardiac defibrillator was placed for cardiomyopathy.  3.  Syncope. The patient's  syncopal episode could be secondary to dehydration, as he has done a lot of activity outdoors and has had decreased oral intake. Symptoms may also be from hypotension/bradycardia. We will need to interrogate his automatic implantable cardiac defibrillator to make sure there is no underlying arrhythmia that is contributing. His beta blocker and digoxin doses have been decreased, as blood pressure has been about 941 systolic.   Thank you very much for this consultation and allowing [place] to participate in this patient's care. We will continue to follow this patient with you.    ____________________________ Merla Riches, PA-C mam:jm D: 05/29/2012 16:35:09 ET T: 05/29/2012 17:37:13 ET JOB#: [id number]  cc: Merla Riches, PA-C, <Dictator> [facility] [place], MD Diera Wirkkala A [facility] PA ELECTRONICALLY SIGNED 06/06/2012 8:59

## 2014-05-15 NOTE — Discharge Summary (Signed)
PATIENT NAME:  [name], PETRUZZI MR#:  [id number] DATE OF BIRTH:  08-09-1960  DATE OF ADMISSION:  05/29/2012  DATE OF DISCHARGE:  05/31/2012  DISCHARGE DIAGNOSES:  1.  Syncope, likely secondary to ventricular fibrillation noted on automatic implantable cardiac defibrillator interrogation status post shock of 41 joules from the automatic implantable cardiac defibrillator and converted to normal sinus rhythm per Cardiology.  2.  Elevated troponin, likely from supply/demand ischemia from dehydration, hypotension and shock from the automatic implantable cardiac defibrillator likely damaging myocardium. No myocardial infarction. No seizure. He did have normal coronaries on cardiac cath which was performed less than a year ago. 3.  Severe cardiomyopathy with ejection fraction of 20% which is decreased from previous reporting of 38% at Cardiology office in 11/2011. The patient has been started on amiodarone which is his new medication. He also had some diastolic dysfunction on the echo. He is on Coreg, ACE inhibitor and digoxin but the dosage has been reduced to hypotension here.  4.  A history of left ventricular thrombus, now resolved and cleared on echocardiogram. He also had a resolved thrombus on echo in 11/2011. With 2 echoes not showing thrombus. His warfarin has been stopped and was recommended to continue on 325 mg aspirin per Cardiology recommendation. 5.  Substance abuse and tobacco abuse. The patient admits using intermittent marijuana, denies any cocaine use. He was counseled for same and agrees to stop it.   SECONDARY DIAGNOSES:  1.  Chronic obstructive pulmonary disease.  2.  Polysubstance abuse.  3.  Cardiomyopathy with ejection fraction of 25%. 4.  Depression. Anxiety.   CONSULTATION:  Cardiology, Dr. Neoma Laming.  LABORATORY, DIAGNOSTIC AND RADIOLOGICAL DATA:   1.  Chest x-ray on May 7th showed no acute cardiopulmonary disease.  2.  CT scan of the head without contrast showed mild small  vessel microangiopathy changes. No acute abnormalities.  3.  Bilateral carotid Doppler's on May 7th showed atherosclerotic disease without evidence of hemodynamically significant stenosis.  4.  A 2-D echocardiogram on May 7th showed severely decreased LV systolic function with EF less than 20%, severely increased LV internal cavity size. Impaired LV diastolic filling with relaxation also. Mildly dilated left and right atrium. Mild mitral valve regurgitation along the mild tricuspid valve regurgitation.  5.  Metabolic panel, UA were negative on admission.  HISTORY AND SHORT HOSPITAL COURSE:  The patient is a 54 year old male with the above-mentioned medical problems who was admitted for syncope. He also had a borderline troponin. And there was concern for possible MI, although his enzymes were thought to be due to AICD giving shock. Please see Dr. Lianne Moris dictated history and physical for further details. Cardiology consultation was obtained with Dr. Neoma Laming who recommended AICD interrogation which showed an episode of ventricular fibrillation and shock converting him to normal sinus. The patient was started on amiodarone drip as per Cardiology recommendation and subsequently switched him over to oral amiodarone. He had a troponin peak of up to 0.45, which was thought to be due to supply/demand ischemia only. No MI was noted. The patient has remained symptom-free. He has ambulated in the hall without any difficulty. No arrhythmia has been reported. He is close to his baseline and he is being discharged home in stable condition.   DISCHARGE PHYSICAL EXAMINATION:  VITAL SIGNS:  On the day of discharge, his vital signs are as follows:  Temperature 98.1, heart rate 61 per minute, respirations 16 per minute, blood pressure 109/75. He is saturating 96% on room  air.  CARDIOVASCULAR:  S1, S2 normal. No murmurs, rubs or gallop.  LUNGS:  Clear to auscultation bilaterally. No wheezing, rales, rhonchi, or  crepitation.  ABDOMEN: Soft, benign.  NEUROLOGIC:  Nonfocal examination. All other physical examination remained at baseline. Please note, the patient had an echocardiogram performed here showing no thrombus for which Cardiology recommended to stop warfarin and just to continue full dose aspirin.   DISCHARGE MEDICATIONS:  1.  Digoxin 0.125 mg p.o.  2.  Ventolin 2 puffs inhaled 4 times a day. 3.  Advair 100/50 1 puff b.i.d.  4.  Lisinopril 2.5 mg p.o. daily.  5.  Amiodarone 400 mg p.o. daily.  6.  Aspirin 325 mg p.o. daily. 7.  Coreg 3.125 mg p.o. at bedtime. 8.  Simvastatin 40 mg p.o. daily.   DISCHARGE DIET:  Low sodium, low fat, low cholesterol.   DISCHARGE ACTIVITY:  As tolerated.  DISCHARGE INSTRUCTIONS AND FOLLOWUP: 1.  The patient was instructed to followup with new primary care physician, Dr. Shade Flood at [facility] clinic on coming Wednesday, May 21st, at 9:00 a.m.  2.  He will also need followup with Cardiology, Dr. Neoma Laming, on this coming Tuesday, May 13th, at 10 am.  TIME SPENT WITH THIS PATIENT:  55 minutes.   ____________________________ Lucina Mellow. Manuella Ghazi, MD vss:jm D: 05/31/2012 15:09:53 ET T: 06/01/2012 10:16:11 ET JOB#: [id number]  cc: Phylliss Strege S. Manuella Ghazi, MD, <Dictator> Sena Hitch, MD Dionisio David, MD [place] MD ELECTRONICALLY SIGNED 06/07/2012 14:14

## 2014-05-16 NOTE — H&P (Signed)
PATIENT NAME:  [name], [name] MR#:  [id number] DATE OF BIRTH:  25-Jul-1960  DATE OF ADMISSION:  10/25/2013  PRIMARY CARE PHYSICIAN: Nonlocal.  REFERRING PHYSICIAN: Larae Grooms, MD   CHIEF COMPLAINT: Chest pain.   HISTORY OF PRESENT ILLNESS: Mr. Holderman is a 54 year old male, multiple myeloma with extensive metastasis, with multiple lytic lesions to the bones. Had multiple admissions recently for severe pain. The patient's pain regimen was well controlled during the last admission on 10/13/2013 and was discharged on 10/15/2013 with 100 mcg of fentanyl patch and Dilaudid. The patient states he woke up at 4: 00 a.m., had a severe coughing spell, since then, started to experience pain in the mid sternal area, 10/10 intensity. Despite taking the pain medications, continued to have the pain. Concerning this, came to the Emergency Department. The patient also states that was experiencing some cold chills. Has mild cough with no productive sputum. Workup in the Emergency Department, CT of the chest showed multiple lytic lesions, right upper lobe ground glass appearance. The patient was also noted to be hypoxic, with oxygen saturations of 87% on room air. The patient received levofloxacin in the Emergency Department.   PAST MEDICAL HISTORY: 1.  Coronary artery disease, nonobstructive.  2.  Cardiomyopathy with ejection fraction of 15%, status post AICD placement.  3.  Asthma.  4.  COPD.  5.  Polysubstance abuse.  6.  Anxiety.  7.  Hypercalcemia.  8.  Anemia due to multiple myeloma.  9.  Renal insufficiency.   ALLERGIES: No known drug allergies.   HOME MEDICATIONS:  1.  Albuterol 2 puffs 4 times a day.  2.  Senna 1 tablet once a day.  3.  Revlimid 25 mg once a day. 4.  Phenergan 25 mg every 6 hours as needed.  5.   MiraLax 17 g once a day.  6.  Lisinopril 5 mg once a day.  7.  Dilaudid 2 mg every 4 hours as needed.  8.  Fentanyl 100 mcg topically.  9.  Docusate sodium 100 mg 2 capsules 2 times  a day.  10.  Carvedilol 12.5 mg 2 times a day.  11.  Aspirin 350 mg once a day.  12.  Amiodarone 200 mg once a day. 13.  Alprazolam 0.5 mg 2 times a day.  14.  Advair Diskus 1 puff 2 times a day.   SOCIAL HISTORY: Continues to smoke 1 pack a day. No history of alcohol use. Continues to use crack cocaine, marijuana. The patient was initially diagnosed with multiple myeloma in September 2015.   FAMILY HISTORY: Mother, aunt with ovarian cancer. Grandfather with colon cancer and prostate cancer.   REVIEW OF SYSTEMS:  CONSTITUTIONAL: Experiencing generalized weakness.  EYES: No change in vision.  ENT: No change in hearing.  RESPIRATORY: No cough, shortness of breath.  CARDIOVASCULAR: Has the chest pain.  GASTROINTESTINAL: No nausea, vomiting, abdominal pain.  GENITOURINARY: No dysuria or hematuria. HEMATOLOGIC: No easy bruising or bleeding.  SKIN: No rash or lesions.  MUSCULOSKELETAL: Has extensive pain all over the body secondary to multiple lytic lesions. NEUROLOGIC: No weakness or numbness in any part of the body.   PHYSICAL EXAMINATION:  GENERAL: This is a thin-built male who looks much older than his stated age lying down in the bed, not in distress.  VITAL SIGNS: Temperature is 98, pulse 89, blood pressure 98/54, respiratory rate of 18, oxygen saturation is 96% on 2 L of oxygen.  HEENT: Head normocephalic, atraumatic. Eyes no scleral icterus. Conjunctivae  normal. Pupils equal and react to light. Mucous membranes moist. No pharyngeal erythema.  NECK: Supple. No lymphadenopathy. No JVD. No carotid bruit.  CHEST: Has focal tenderness on the mid sternal area and the left and the right parasternal areas. LUNGS: Bilateral wheezing.  HEART: S1, S2 regular. No murmurs are heard.  ABDOMEN: Bowel sounds plus. Soft, nontender, nondistended.  EXTREMITIES: No pedal edema. Pulses 2+.  NEUROLOGIC: The patient is alert, oriented to place, person, and time. Cranial nerves II-XII are intact. Motor  5/5 in upper and lower extremities.   LABORATORIES: UA negative. CBC: WBC of 6.6, hemoglobin 8.6, platelet count of 100,000 with neutrophils of 79%. BMP: Sodium 132. Rest of all the values are within normal limits.   CT angiogram shows no evidence of PE, diffuse myelomatous involvement of the visualized skeleton with pathologic compression fracture of the T9 vertebral body, patchy ground glass opacity within the right upper lobe, infectious or inflammatory etiology.   ASSESSMENT AND PLAN: Mr. Dirk with a known history of multiple myeloma comes with chest pain. This seems to more of a musculoskeletal pain. 1.  Chest pain, musculoskeletal pain. The patient has multiple lytic lesions; however, CT, chest, does not show any fractures. Continue the current pain regimen and follow up.  2.  Pneumonia. Will keep the patient on levofloxacin and follow up.  3.  Chronic obstructive pulmonary disease exacerbation. Keep the patient on DuoNebs and Solu-Medrol.  4.  Hypoxemia secondary to chronic obstructive pulmonary disease exacerbation and pneumonia. Continue treating both conditions and follow up. Continue with oxygen. Will repeat the oxygen saturations with ambulation prior to the discharge of patient continues to require oxygen at home.  5.  Multiple myeloma. The patient is on oral treatment.  6.  Anemia secondary to the multiple myeloma.  7.  Deep vein thrombosis prophylaxis with Lovenox.   TIME SPENT: 50 minutes.    ____________________________ Monica Becton, MD pv:ST D: 10/25/2013 23:31:21 ET T: 10/26/2013 00:32:00 ET JOB#: [id number]  cc: Monica Becton, MD, <Dictator> Monica Becton MD ELECTRONICALLY SIGNED 10/27/2013 23:13

## 2014-05-16 NOTE — Consult Note (Signed)
PATIENT NAME:  [name], COVIN MR#:  [id number] DATE OF BIRTH:  August 23, 1960  DATE OF CONSULTATION:  10/11/2013  REFERRING PHYSICIAN:  Barnetta Chapel P. Volanda Napoleon, [place]:  Shantika Bermea R. Ma Hillock, MD  REASON FOR CONSULTATION: Myeloma, continuation of care.   HISTORY OF PRESENT ILLNESS: The patient is a 54 year old gentleman with a past medical history significant for newly diagnosed multiple myeloma, had bone marrow biopsy on 09/08, and started on treatment with Velcade and Decadron, also waiting to start on oral Revlimid. The patient has numerous PET-positive lytic bone lesions and severe bone pains on presentation. He was started on fentanyl patch and oxycodone as needed for breakthrough pain. He also received Zometa recently for hypercalcemia and myeloma. The patient presented to Emergency Room with severe uncontrolled exacerbation of low back pain. He denies any radiation of pain down his legs. He denies any loss of bowel or bladder control. He does have some constipation since taking more pain medication. He has been started on morphine sulfate PCA yesterday, states that the pain overnight did well, but again today morning is getting worse. No neurological symptoms otherwise. Appetite is fair. Chronic weakness and fatigue is unchanged. No new dyspnea or cough.   ALLERGIES: No known allergies.   HOME MEDICATIONS: Advair discus 100/50 b.i.d., amiodarone 400 mg daily, carvedilol 12.5 mg b.i.d., Decadron 40 mg weekly for myeloma, fentanyl 25 mcg patch q. 72 hours, lisinopril 5 mg daily, promethazine 25 mg q. 6 hours p.r.n. nausea, Roxicodone 5 to 10 mg q. 3-4 hours p.r.n. pain, Ventolin inhaler as needed.   FAMILY HISTORY: Noncontributory.   SOCIAL HISTORY: Chronic smoker 1 pack per day. Denies alcohol usage. History of recreational drug usage.   REVIEW OF SYSTEMS:  GENERAL AND CONSTITUTIONAL: Generalized weakness and fatigue. No fevers or chills.  HEENT: No headaches, dizziness at rest. No  epistaxis, ear or jaw pain.  CARDIAC: No angina, palpitation, orthopnea, or PND.  LUNGS: Has dyspnea on exertion, but no new cough, sputum, hemoptysis.  GASTROINTESTINAL: No nausea or vomiting. Has constipation. No bright red blood in stools or melena.  GENITOURINARY: No dysuria or hematuria.  MUSCULOSKELETAL: Severe bone and back pains as described above.  EXTREMITIES: Has mild swelling, no new pain in the leg or calf.  NEUROLOGIC: No new focal weakness, seizures, or loss of consciousness.  ENDOCRINE: No polyuria or polydipsia.   PHYSICAL EXAMINATION: GENERAL: The patient is weak and fatigued looking, otherwise sitting in bed, alert and oriented to self, place, person, and time and converses appropriately. No icterus. Pallor present.  VITAL SIGNS: 97.6, 65, 20, 108/66, 91% on room air.  HEENT: Normocephalic, atraumatic. Extraocular movements intact. Sclerae anicteric.  NECK: Negative for lymphadenopathy.  CARDIOVASCULAR: S1, S2, regular rate and rhythm.  LUNGS: Bilateral good air entry, diminished at bases.  ABDOMEN: Soft, mild epigastric tenderness. No hepatosplenomegaly.  EXTREMITIES: Mild pedal edema. No cyanosis.  SKIN: No generalized rashes or major bruising.  NEUROLOGIC: Limited examination. Cranial nerves intact, moves all extremities spontaneously.   LABORATORY RESULTS: Hemoglobin 8.3, platelets 438,000, WBC 8300, ANC 5800. Creatinine 1.16, calcium 7.2. Troponin I mildly elevated at 0.1.   IMAGING: Lumbar spine x-ray September 18, increasing L2 compression deformity.   IMPRESSION AND RECOMMENDATIONS: A 54 year old patient with recently diagnosed aggressive multiple myeloma with numerous lytic bone lesions, bone marrow biopsy reporting 50% to 60% involvement by plasma cells and just started on treatment for myeloma with first dose of Velcade injection and oral Decadron weekly. He is waiting to start on oral  Revlimid. The patient has been admitted with uncontrolled lower back pain.  Currently he is on morphine sulfate PCA and continuous infusion. Overall, the pain seems to be coming under control, but today morning it is again worse since he started to move. The patient feels that he could ambulate better if he has a lower back supportive brace or corset, will see if we can obtain this for him. Otherwise, currently continue morphine sulfate 6 mg per hour, will continue to increase in increments of 0.5 mg per hour up to maximum of 10 mg per hour to see if he can achieve pain control. The patient cannot get MRIs done due to pacemaker. If pain persists on Monday, we will consider a CT scan evaluation. Also, will discuss with radiation oncologist on Monday to see if there is any particular area that he can get some palliative radiation for pain control. Otherwise, blood counts and renal functions are steady. 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 We will continue to follow.   Thank you for the referral. Please feel free to contact me for any additional questions.    ____________________________ Rhett Bannister Ma Hillock, MD srp:at D: 10/11/2013 15:15:04 ET T: 10/11/2013 16:07:33 ET JOB#: [id number]  cc: Landon Bassford R. Ma Hillock, MD, <Dictator> Alveta Heimlich MD ELECTRONICALLY SIGNED 10/11/2013 17:10

## 2014-05-16 NOTE — Discharge Summary (Signed)
PATIENT NAME:  [name], [name] MR#:  [id number] DATE OF BIRTH:  1960/02/16  DATE OF ADMISSION:  10/13/2013 DATE OF DISCHARGE:  10/15/2013  ADMISSION DIAGNOSES:  1.  Intractable pain from multiple myeloma.  2.  History of multiple myeloma.   DISCHARGE DIAGNOSES:  1.  Intractable pain from multiple myeloma, resolved.  2.  Multiple myeloma.   CONSULTATIONS:  1.  Radiation oncology. 2.  Palliative care.  3.  Dr. Ma Hillock.   HOSPITAL COURSE: This is a very pleasant 54 year old male with a history of multiple myeloma with lytic lesions to the bilateral acetabular regions as well as the sacral region, who was admitted for pain control. For further details, please refer to the H and P.  1.  Intractable pain. The patient was admitted for pain control. He was initially placed on a PCA pump. I obtained a palliative care consultation to help [place] with pain management. He is now on 100 mg of fentanyl along with Dilaudid for breakthrough. He also received a radiation oncology consultation and did receive 1 radiation treatment while in the hospital and will follow up with Dr. Baruch Gouty in radiation as scheduled.  2.  Multiple myeloma. As mentioned,  the patient was seen Dr. Ma Hillock as well as radiation oncology. The patient will follow up with Dr. Ma Hillock in 1 week for pain control. He will  continue on with radiation.  3.  History of cardiomyopathy, EF of 20%. The patient was continued on his outpatient medications.  4.  History of COPD, which was stable.   DISCHARGE MEDICATIONS: 1.  Ventolin HFA 2 puffs 4 times a day.  2.  Advair Diskus 100/50 b.i.d.  3.  Amiodarone 400 mg daily.  4.  Promethazine 25 mg q. 6 hours p.r.n. 5.  Lisinopril 5 mg daily.  6.  Coreg 12.5 b.i.d.  7.  Dexamethasone 40 mg weekly.  8.  Fentanyl 100 mcg daily.  9.  Hydromorphone 2 mg q. 4 hours p.r.n. pain.  10.  Docusate 100 mg 2 tablets t.i.d.  11.  Senna 1 tablet daily.  12.  MiraLax 17 grams daily.   DISCHARGE DIET: Low  sodium.   DISCHARGE ACTIVITY: As tolerated.   DISCHARGE FOLLOWUP:  The patient will follow up with Dr. Ma Hillock in 1 week.   TIME SPENT: Approximately 35 minutes. The patient was stable for discharge.   ____________________________ Olivine Hiers P. Benjie Karvonen, MD spm:lr D: 10/15/2013 11:39:48 ET T: 10/15/2013 12:36:42 ET JOB#: [id number]  cc: [name] Novosad P. Benjie Karvonen, MD, <Dictator> Sandeep R. Ma Hillock, MD Donell Beers Reveca Desmarais MD ELECTRONICALLY SIGNED 10/15/2013 13:06

## 2014-05-16 NOTE — H&P (Signed)
PATIENT NAME:  [name], SIPPEL MR#:  [id number] DATE OF BIRTH:  [birth year]  DATE OF ADMISSION:  09/25/2013  PRIMARY CARE PHYSICIAN: Nonlocal.   REFERRING PHYSICIAN: Algis Liming. Jimmye Norman, MD   CHIEF COMPLAINT: Abdominal pain and back pain.   HISTORY OF PRESENT ILLNESS: Mr. Chaikin is a 54 year old male with a history of dilated cardiomyopathy with EF of 20% status post AICD, continued polysubstance abuse including cocaine, previous history of left ventricular thrombus treated with anticoagulation, who comes to the Emergency Department with complaints of abdominal pain, started about 3 weeks back. The patient states the pain is in the lower part of the abdomen, radiating to the back. The pain is especially worse with any movement. The patient was taking aspirin and Tylenol with some improvement; however, the pain has been gradually getting worse. Concerning this, came to the Emergency Department. Workup in the Emergency Department with CT, abdomen and pelvis, showed severe prostate prominence. Could be from the BPH; prostatitis or prostate malignancy cannot be excluded. The patient denies having any nausea, vomiting. Denies having any diarrhea. The patient is noted to have elevated protein of 12.5 with albumin of 2.6 and a hemoglobin of 8.6. The patient's hemoglobin dropped from 12.5 to 8.6 in the last 1 year. The patient also states that has lost about 60 pounds of weight. States that has poor appetite.   Dr. Jimmye Norman discussed with one of the oncologists, and the decision was made to obtain a PET scan; however, it was not communicated to the patient and was now discharged from the Emergency Department. Considering the time of the day and patient's medical condition with the polysubstance abuse, concern about followup, and the decision is made to admit the patient.   PAST MEDICAL HISTORY:  1.  Coronary artery disease; had nonobstructive disease on left heart catheterization. 2.  Dilated cardiomyopathy with  EF of 20% status post AICD placement.  3.  Kidney stones.  4.  Asthma.  5.  COPD.  6.  Polysubstance abuse.  7.  Anxiety.   PAST SURGICAL HISTORY: AICD placement.   ALLERGIES: No known drug allergies.   HOME MEDICATIONS:  1.  Albuterol 2 puffs 4 times a day.  2.  Simvastatin 40 mg once a day.  3.  Lisinopril 5 mg half a tablet once a day.  4.  Coreg 3.125 mg once a day.  5.  Aspirin 325 mg once a day.  6.  Amiodarone 400 mg once a day.  7.  Advair Diskus 2 times a day.   SOCIAL HISTORY: The patient continues to smoke 1 pack a day; in the past, smoked 2 packs a day. Last use of cocaine was last night, as well as marijuana. Denies drinking alcohol; however, drinks occasionally. Married, lives with his wife.   FAMILY HISTORY: Mother had COPD. Father died of heart attack in his 73s.   REVIEW OF SYSTEMS:  CONSTITUTIONAL: Experiencing generalized weakness, weight loss.  EYES: No change in vision.  ENT: No change in hearing.  RESPIRATORY: No cough, shortness of breath.  CARDIOVASCULAR: No chest pain, palpitations.  GASTROINTESTINAL: No nausea, vomiting, abdominal pain.  GENITOURINARY: No dysuria or hematuria.   HEMATOLOGIC: No easy bruising or bleeding.  SKIN: No rash or lesions.  MUSCULOSKELETAL: Has pain in the all-over part of the back. NEUROLOGIC: No weakness or numbness in any part of the body.   PHYSICAL EXAMINATION:  GENERAL: This is a thin-built male lying down in the bed, not in distress.  VITAL SIGNS: Temperature  98.2, pulse 83, blood pressure 180/73, respiratory rate of 18, oxygen saturation is 94% on room air.  HEENT: Head normocephalic, atraumatic. There is no scleral icterus. Conjunctivae normal. Pupils equal and react to light. Mucous membranes moist. No pharyngeal erythema.  NECK: Supple. No lymphadenopathy. No JVD. No carotid bruit.  CHEST: Has no focal tenderness.  LUNGS: Bilateral clear to auscultation.  HEART: S1, S2 regular. No murmurs are heard.  ABDOMEN:  Bowel sounds plus. Soft. Mild tenderness. Seems to be more of a muscular. No rebound or guarding. I could not appreciate any hepatosplenomegaly. Has tenderness on the left costovertebral area.  EXTREMITIES: No pedal edema. Pulses 2+.  NEUROLOGIC: Patient is alert, oriented to place, person, and time. Cranial nerves II through XII intact. Motor 5/5 in upper and lower extremities.   ASSESSMENT AND PLAN: 1.  Back pain very highly concerning about musculoskeletal pain. CT, abdomen and pelvis, does not reveal any lesions or abnormalities, showed severe osteopenia. Concerning about patient's high protein level, will obtain SPEP, UPEP. Will get a skeletal survey. 2.  Significant enlargement of the prostate. Consult urology in the morning. Get the PSA level. 3.  Polysubstance abuse. The patient has cocaine and cannabinoids positive. Counseled with the patient.  4.  Continued tobacco use. Counseled with the patient regarding tobacco cessation. Seems to have poor understanding.  5.  Dilated cardiomyopathy with ejection fraction of 20%. No current issues. Continue the lisinopril and Coreg.  6.  Keep the patient on deep vein thrombosis prophylaxis with Lovenox.   TIME SPENT: 50 minutes.    ____________________________ Monica Becton, MD pv:ST D: 09/25/2013 01:19:27 ET T: 09/25/2013 01:57:46 ET JOB#: [id number]  cc: Monica Becton, MD, <Dictator> Monica Becton MD ELECTRONICALLY SIGNED 09/27/2013 20:57

## 2014-05-16 NOTE — Discharge Summary (Signed)
PATIENT NAME:  [name], [name] MR#:  [id number] DATE OF BIRTH:  19-May-1960  DATE OF ADMISSION:  09/25/2013 DATE OF DISCHARGE:  09/26/2013  ADMITTING PHYSICIAN: Monica Becton, MD.    ADMITTED DIAGNOSIS: Back pain.   ASSOCIATED DIAGNOSES:  1.  Multiple lytic lesions of the bone seen on PET scan.  2.  Right-sided back pain.  3.  Thickened area of the colon seen on CT.  4.  Enlarged prostate.  5.  Elevated serum protein.  6.  Anemia.  7.  Dilated cardiomyopathy with ejection fraction of 20%, automatic implantable cardiac defibrillator in place.   CONSULTATIONS: There were no inpatient consultations. The patient is scheduled to see Dr. Ma Hillock, oncology on Tuesday morning, September 8 for further evaluation and probable bone marrow biopsy.   PROCEDURES:  1.  CT abdomen and pelvis shows severe prostate prominence. Mild prominence of small large bowel loops noted suggesting possible adynamic ileus. Mild focal thickening of left colon. A possible fixed lesion cannot be excluded. Mild diverticulosis. Severe diffuse osteopenia. Minimal compression of L2.  2.  Chest PA and lateral x-ray September 2 shows COPD. No acute process.  3.  PET CT scan September 4 shows multiple lytic osseous lesions, some of which demonstrated associated soft tissue components on CT. A dominant soft tissue lesion in the left acetabulum/posterior column measuring 2.8 x 1.8 cm. Representative lesions include right acromion, left anterolateral 4th rib, left posteromedial 8th rib, sternum, right iliac wing, sacrum, left posterior column/acetabulum.   HOSPITAL COURSE AND TREATMENT:  1.  Multiple lytic lesions seen on PET scan. Findings concerning for multiple myeloma or other bone marrow process. Serum protein elevated throughout admission at 12.5 with a decreased serum albumin of 2.6. The patient has had a 60 pound weight loss over the past year. He has mild renal insufficiency with a GFR of 47. An appointment has been made with  Dr. Ma Hillock, oncology, on Tuesday September 8 for evaluation and bone marrow biopsy.  2.  Right-sided back pain: Likely due to #1, though area of pain does not associate with lytic lesions seen. Have prescribed Percocet for the next 10 days for pain. He can also take ibuprofen which seems to help with the pain.  3.  Thickened are of colon CT: This will need to be followed by colonoscopy in the future. The patient has never had a colonoscopy.  4.  Enlarged prostate seen on CT. PSA is normal. This is not likely prostate cancer.  5.  Anemia: Likely due to #1. At this time, SPEP and UPEP are pending. Hemoglobin is 8.1 at the time of discharge. Iron studies show TIBC, iron saturation normal. Serum iron slightly low at 59.  6.  Dilated cardiomyopathy with ejection fraction of 20%, AICD present. He showed no signs of failure during this admission. He had no chest pain. Troponin was slightly elevated with no significant EKG changes. No chest pain. Elevation in troponin was likely due to CHF rather than ACS.   DISCHARGE MEDICATIONS: 1.  Ventolin HFA CFC free 90 mcg/inhalation inhaler 2 puffs inhaled 4 times a day as needed for shortness of breath.  2.  Advair Diskus 100 mcg/50 mcg inhalation powder, 1 puff inhaled 2 times a day.  3.  Lisinopril 5 mg, 0.5 tablets, 1 tablet orally once a day.  4.  Amiodarone 40 mg 1 tablet orally once a day.  5.  Aspirin 325 mg 1 tablet orally once a day.  6.  Carvedilol 3.125 mg 1 tablet orally  twice a day.  7.  Simvastatin 40 mg 1 tablet once a day at bedtime.  8.  Ibuprofen 600 mg 1 tablet every 8 hours as needed for severe pain.  9.  Acetaminophen hydrocodone 325 mg/5 mg oral tablet 1 tablet every 4 hours as needed for pain x 10 days #60.   CONDITION ON DISCHARGE: Stable.   There are no activity or diet restrictions. The patient is to maintain his heart healthy low sodium diet. He will follow up with Dr. Ma Hillock on September 8 for further evaluation and bone marrow  biopsy.   PHYSICAL EXAMINATION:  VITAL SIGNS: Temperature 97.7, pulse 96, respirations 22, blood pressure 100/74, pulse oximetry 93.  GENERAL: No acute distress.  PULMONARY: Lungs are clear to auscultation bilaterally with good air movement.  CARDIOVASCULAR: Regular rate and rhythm, no murmurs, rubs, or gallops. There is no edema, peripheral pulses are 2+.  ABDOMEN: Soft, nontender, no guarding, no rebound.  MUSCULOSKELETAL: There is tenderness to palpation over the right iliac crest, otherwise no tender or swollen joints. No deformity. Range of motion is normal.  NEUROLOGIC: Cranial nerves II through XII are grossly intact, nonfocal neurologic examination.  PSYCHIATRIC: The patient is slightly anxious. Alert and oriented with good insight.   LABORATORY DATA: Sodium 132, potassium 4.5, chloride 102, bicarbonate 26, creatinine 1.6, BUN 23, glucose 72. White blood cells 3.9, hemoglobin 8.1, platelets 250,000, MCV 102.   TIME SPENT ON DISCHARGE: 40 minutes.    ____________________________ Earleen Newport. Volanda Napoleon, MD cpw:at D: 09/26/2013 16:22:02 ET T: 09/26/2013 17:04:01 ET JOB#: [id number]  cc: Barnetta Chapel P. Volanda Napoleon, MD, <Dictator> Aldean Jewett MD ELECTRONICALLY SIGNED 09/27/2013 15:06

## 2014-05-16 NOTE — H&P (Signed)
PATIENT NAME:  [name], [name] MR#:  [id number] DATE OF BIRTH:  [birth year]  DATE OF ADMISSION:  10/13/2013  PRIMARY CARE PHYSICIAN: None.  CHIEF COMPLAINT: Back pain.  HISTORY OF PRESENT ILLNESS: This is a 54 year old male with history of multiple myeloma with mets to bones who was recently here on September 18th and signed out [facility] on September 19th and comes back today because of his low back pain. The patient is having back pain and hip pain and because of that he came here. The patient was admitted September 18th for the same and was on PCA pump. The suddenly decided to go AMA on the 19th, in the evening. The patient takes fentanyl patch 25 mcg every 1 to 2 hours and his patch was taken off this morning and he did not place a new one. He says that he feels a lot of pain in his lower back and hips. Denies any weakness in the legs. No numbness in legs. No incontinence. The patient says that any movement makes the pain worse and it is 10 out of 10 in severity and relieved with pain medications. The patient sees Dr. Ma Hillock for multiple myeloma and he is on oral chemotherapy. The patient has lytic bone lesions and multiple myeloma. The patient has a pacemaker so he did not have a MRI of the back.   PAST MEDICAL HISTORY: Significant for: 1.  Coronary artery disease which is nonobstructive coronary artery disease. 2.  Cardiomyopathy with EF of 20% status post AICD and pacemaker placement.  3.  Asthma.  4.  COPD. 5.  Polysubstance abuse. 6.  Anxiety.  7.  Hypercalcemia. 8.  Anemia due to cancer. 9.  Renal insufficiency.  FAMILY HISTORY: Significant for aunt who has history of ovarian cancer. Grandfather has colon cancer and prostate cancer.   SOCIAL HISTORY: Chronic smoker smokes about a pack per day for 35 years. No alcohol. The patient does use crack cocaine, also uses marijuana. The patient was diagnosed with multiple myeloma in September 2015.   ALLERGIES: No known drug allergies.   PAST  SURGICAL HISTORY: Significant for AICD placement.  HOME MEDICATIONS: 1.  Ventolin 90 mcg 2 puffs 4 times daily.  2.  Oxycodone 5 mg 1 to 2 tablets every 3 to 4 hours as needed for pain.  3.  Lisinopril 5 mg p.o. daily.  4.  Fentanyl. The patient says that he is taking 25 mcg every 72 hours.  5.  Decadron 10 tablets of 4 mg once a week, starting on September 16th. He takes it before chemo.  6.  Coreg 12.5 mg p.o. b.i.d. 7.  Amiodarone 400 mg daily.  8.  Advair Diskus 100/50 one puff b.i.d.   SOCIAL HISTORY: The patient lives with his wife.   REVIEW OF SYSTEMS: CONSTITUTIONAL: The patient feels fatigued. No fever. Does feel weak and tired.  EYES: No blurred vision.  ENT: No tinnitus. No ear pain. No epistaxis. No difficulty swallowing.  RESPIRATORY: No cough. No wheezing. No hemoptysis.  GASTROINTESTINAL: He has nausea but no vomiting. No diarrhea. No abdominal pain.  GENITOURINARY: No dysuria.  ENDOCRINE: No polyuria or nocturia.  INTEGUMENTARY: No skin rashes.  MUSCULOSKELETAL: Has low back pain. NEUROLOGIC: Denies any weakness in the legs. No numbness in the legs. The patient denies any history of falls. PSYCHIATRIC: Does have history of anxiety.  PHYSICAL EXAMINATION: VITAL SIGNS: Temperature 97 Fahrenheit, heart rate 86, blood pressure 96/57, sats 94% on room air.  GENERAL: The patient is alert,  awake, oriented, in moderate distress because of low back pain.  HEAD: Normocephalic, atraumatic.  EYES: Pupils equally reacting to light. Extraocular movements intact.  NOSE: No nasal lesions. No drainage.  EARS: No drainage. No external lesions.  MOUTH: No lesions. No exudates.  NECK: Supple, symmetric. No masses. Thyroid in the midline, not enlarged. No JVD, nontender.  RESPIRATORY: Good respiratory effort. Clear to auscultation. No wheeze. No rales.  CARDIOVASCULAR: S1, S2 regular. No murmurs. The patient's PMI is not displaced.  GASTROINTESTINAL: Abdomen is soft, nontender,  nondistended. Bowel sounds present.  MUSCULOSKELETAL: The patient's gait is not tested because of severe low back pain and does not have any point tenderness in the lower back. Strength and tone are equal bilaterally in the lower legs.  SKIN: Inspection is normal, a little dehydrated.  LYMPH: No lymphadenopathy in cervical or axillary regions.  VASCULAR: Good pulses.  NEUROLOGIC: The patient's cranial nerves II through XII are intact. Power 5/5 in upper and lower extremities. Sensation is intact. DTRs 2+ bilaterally.  PSYCHIATRIC: Motor and affect are within normal limits.   DIAGNOSTIC DATA: Electrolytes: Serum sodium 130, potassium 4.2, chloride 101, bicarb 24, BUN 11, creatinine 1.17, glucose. LFTs within normal limits. The patient's WBC is 10, hemoglobin is 8.7, hematocrit 26.5 and platelets 512,000.  ASSESSMENT AND PLAN:  62.  A 54 year old male with multiple myeloma, metastasis to bones, comes in with severe intractable back pain. The patient is going to be on observation status. We will restart the morphine PCA pump and advance fentanyl patch to 25 mcg. The patient will probably need to be on MS Contin and also fentanyl patch when he goes home.  2.  The patient has a pacemaker and he could not have a MRI. The patient at this time has no weakness in the legs or incontinence so we will control his pain and see how he does.  3.  Multiple myeloma. Sees Dr. Leia Alf. Will consult him. 4.  History of nonobstructive coronary artery disease and also cardiomyopathy with EF 20%. Now he is hypotensive so we will hold lisinopril and Coreg and continue with cautious hydration.   CODE STATUS: The patient is a FULL code. Seen by palliative care the last time and will reconsult them.  TIME SPENT: About 50 minutes.   ____________________________ Epifanio Lesches, MD sk:sb D: 10/13/2013 14:37:05 ET T: 10/13/2013 15:06:58 ET JOB#: [id number]  cc: Epifanio Lesches, MD, <Dictator> Epifanio Lesches MD ELECTRONICALLY SIGNED 11/01/2013 22:32

## 2014-05-16 NOTE — Consult Note (Signed)
Reason for Visit: This 54 year old Male patient presents to the clinic for initial evaluation of  back pain associated with involvement of pelvis from multiple myeloma .   Referred by Dr Ma Hillock.  Diagnosis:  Chief Complaint/Diagnosis   patient is a 54 year old male newly diagnosed multiple myeloma with CD 56 positive monoclonal plasma cell population present with PET positive lytic bone lesions unintentional weight loss. Patient has lytic lesions in his bilateral acetabular regions as well as his sacral region. Admitted for pain control now seen for palliative radiation  Pathology Report pathology reports reviewed   Imaging Report PET CT scan reviewed   Referral Report clinical notes reviewed   Planned Treatment Regimen palliative radiation therapy to pelvis   HPI   patient is a 54 year old male recently diagnosed with multiple myeloma with bone marrow biopsy performed 09/30/2013 showing abnormal myeloma the fish panel with elevated serum kappa and lambda levels. PET/CT scan showed multifocal hypermetabolic lytic osseous lesions including left acetabular right acetabular and sacral region. has been started onVelcade and Decadron and waiting to start an oral Revlimid. He recently went AMA although was readmitted again with significant exacerbation of lower back pain. He has been started on morphine PCA and his pain has improved he is seen today as a hospital consultation. He states bilateral hips do hurt also is having rib pain as well as lower back pain. I've asked to evaluate the patient for possible palliative radiation therapy.  Past Hx:    Mutiple Myeloma:    bone lesions:    cad:    Kidney Stones:    Asthma:    defibrillator:   Past, Family and Social History:  Past Medical History positive   Cardiovascular coronary artery disease; pacemaker; cardiomyopathywith ejection fraction of 20%   Respiratory asthma; COPD   Genitourinary renal insufficiency    Neurological/Psychiatric anxiety   Past Medical History Comments hypercalcemia, anemia   Family History positive   Family History Comments at with history of ovarian cancer grandfather with colon cancer and prostate cancer   Social History positive   Social History Comments 40-pack-year smoking history as well as recreational drug usepolysubstance abuse   Additional Past Medical and Surgical History seen in his hospital room today   Allergies:   No Known Allergies:   Home Meds:  Home Medications: Medication Instructions Status  Ventolin HFA CFC free 90 mcg/inh inhalation aerosol 2 puff(s) inhaled 4 times a day Active  Advair Diskus 100 mcg-50 mcg inhalation powder 1 puff(s) inhaled 2 times a day Active  Duragesic-12 12 mcg/hr transdermal film, extended release 1 patch transdermal every 72 hours Active  promethazine 25 mg oral tablet 1 tablet orally every 6 hours as needed for nausea or vomiting  Active  amiodarone 400 mg oral tablet 1 tab(s) orally once a day Active  lisinopril 5 mg oral tablet 1 tab(s) orally once a day Active  carvedilol 12.5 mg oral tablet 1 tab(s) orally 2 times a day Active  Roxicodone 5 mg oral tablet 1-2 tab(s) orally every 3 to 4 hours, As Needed - for Pain Active  dexamethasone 4 mg oral tablet 40 milligram(s) orally once a week Active   Review of Systems:  General negative   Performance Status (ECOG) 0   Skin negative   Breast negative   Ophthalmologic negative   ENMT negative   Respiratory and Thorax negative   Cardiovascular see HPI   Gastrointestinal negative   Genitourinary negative   Musculoskeletal negative   Neurological negative  Hematology/Lymphatics see HPI   Endocrine negative   Allergic/Immunologic negative   Review of Systems   denies any weight loss, fatigue, weakness, fever, chills or night sweats. Patient denies any loss of vision, blurred vision. Patient denies any ringing  of the ears or hearing loss. No  irregular heartbeat. Patient denies heart murmur or history of fainting. Patient denies any chest pain or pain radiating to her upper extremities. Patient denies any shortness of breath, difficulty breathing at night, cough or hemoptysis. Patient denies any swelling in the lower legs. Patient denies any nausea vomiting, vomiting of blood, or coffee ground material in the vomitus. Patient denies any stomach pain. Patient states has had normal bowel movements no significant constipation or diarrhea. Patient denies any dysuria, hematuria or significant nocturia. Patient denies any problems walking, swelling in the joints or loss of balance. Patient denies any skin changes, loss of hair or loss of weight. Patient denies any excessive worrying or anxiety or significant depression. Patient denies any problems with insomnia. Patient denies excessive thirst, polyuria, polydipsia. Patient denies any swollen glands, patient denies easy bruising or easy bleeding. Patient denies any recent infections, allergies or URI. Patient "s visual fields have not changed significantly in recent time.   Physical Exam:  General/Skin/HEENT:  General normal   Skin normal   Eyes normal   ENMT normal   Head and Neck normal   Additional PE a well-developed thin male in NAD. Lungs are clear to A&P cardiac examination shows regular rate and rhythm. Motor sensory and DTR levels are equal and symmetric in upper and lower extremities. Deeppalpation of his sacral area does elicit pain. Range of motion of his lower extremities does not elicit pain. Proprioception is intact.   Breasts/Resp/CV/GI/GU:  Respiratory and Thorax normal   Cardiovascular normal   Gastrointestinal normal   Genitourinary normal   MS/Neuro/Psych/Lymph:  Musculoskeletal normal   Neurological normal   Lymphatics normal   Other Results:  Radiology Results: [place]:    11-Sep-15 13:05, Bone Survey Limited Dx  Bone Survey Limited Dx   REASON FOR  EXAM:    Myeloma and bone pain (include skull)  COMMENTS:       PROCEDURE: DXR - DXR BONE SURVEY METASTATIC  - Oct 03 2013  1:05PM     CLINICAL DATA:  54 year old male with multiple myeloma. Bone pain  and cough. Subsequent encounter.    EXAM:  CHEST - 2 VIEW; METASTATIC BONE SURVEY    COMPARISON:  PET-CT 09/26/2013.  Chest radiographs 09/24/2013.    FINDINGS:  CHEST:  Stable lung volumes. Stable left chest cardiac AICD. Normal cardiac  size and mediastinal contours. Visualized tracheal air column is  within normal limits. No pneumothorax, pulmonary edema, pleural  effusion or acute pulmonary opacity. Stable pulmonary interstitial  markings.    BONE SURVEY:    Numerous small lytic lesions in the calvarium.    Advanced osteopenia throughout the cervical spine. No cervical  compression fracture.    Advanced osteopenia throughout the thoracic spine. Normal thoracic  segmentation. Mild to moderate pathologic compression fracture of  T9, chiefly affecting the superior endplate.    Advanced osteopenia in the lumbar spine. Moderate pathologic  compression fracture of L2, chiefly affecting the superior endplate.    Retained stool in the lower abdomen and pelvis, limiting bone  detail. Advanced osteopenia in the pelvis. Left posterior acetabular  lesion not well demonstrated radiographically.    Osteopenia about both shoulders. Similar osteopenia about both  proximal femurs, although on  the left there are several small  discrete left femoral lytic lesions (image 16). There is also a  discrete lytic lesion in the proximal third right humerus shaft. No  appendicular skeleton pathologic fracture identified.   IMPRESSION:  1.  No acute cardiopulmonary abnormality.  2. Diffuse myeloma this involvement of the skeleton, primarily  manifest as Severe osteopenia. This study will serve as a baseline  exam. Pathologic compression fractures at T9 and L2. Small lytic  lesions in the  calvarium, and occasionally noted in the proximal  appendicular skeleton.      Electronically Signed   By: Lars Pinks M.D.    On: 10/03/2013 14:26         Verified By: Gwenyth Bender. [place] Crane, M.D.,  LabUnknown:    04-Sep-15 14:07, PET/CT Scan Testicular Cancer Diagnosis  PACS Image     11-Sep-15 13:05, Bone Survey Limited Dx  PACS Image   Nuclear Med:    04-Sep-15 14:07, PET/CT Scan Testicular Cancer Diagnosis  PET/CT Scan Testicular Cancer Diagnosis   REASON FOR EXAM:    scrotum edema  COMMENTS:       PROCEDURE: PET - PET/CT DX TESTICULAR CA  - Sep 26 2013  2:07PM     CLINICAL DATA:  Initial treatment strategy for suspected multiple  myeloma. Additional history of left hip/bone pain, enlarged  prostate, scrotal edema, sigmoid wall thickening on CT.    EXAM:  NUCLEAR MEDICINE PET SKULL BASE TO THIGH    TECHNIQUE:  12.0 mCi F-18 FDG was injected intravenously. Full-ring PET imaging  was performed from the skull base to thigh after the radiotracer. CT  data was obtained and used for attenuation correction and anatomic  localization.    FASTING BLOOD GLUCOSE:  Value: 73 mg/dl    COMPARISON:  CT abdomen pelvis dated 09/24/2013    FINDINGS:  NECK    No hypermetabolic lymph nodes in the neck.    CHEST    7 mm short axis right paratracheal node (series 3/image 36), max SUV  4.1. Otherwise, no suspicious mediastinal lymphadenopathy.    No suspicious pulmonary nodules on the CT scan.    Coronary atherosclerosis.  Left subclavian ICD.    ABDOMEN/PELVIS    No abnormal hypermetabolic activity within the liver, pancreas,  adrenal glands, or spleen.    No hypermetabolic lymph nodes in the abdomen or pelvis.    Atherosclerotic calcifications of the abdominal aorta and branch  vessels. Dense bariumwithin multiple loops of colon, which obscures  evaluation.    No focal hypermetabolism within the prostate.    SKELETON    Multifocal lytic osseous lesions, some of which  demonstrate  associated soft tissue components on CT, with associated  hypermetabolism. Dominant soft tissue lesion in the left  acetabulum/posterior column measures 2.8 x 1.8 cm.    Representative lesions include:    --Right acromion, max SUV 8.9 (PET image 53)  --Left anterolateral 4th rib, max SUV 5.9 (PET image 100)    --Left posterior medial 8th rib, max SUV 5.1 (PET image 101)    --Sternum, max SUV 5.2 (PET image 104)    --Right iliac wing, max SUV 4.4 (PET image 204)    --Sacrum, max SUV 8.2 (PET image 217)    --Left posterior column/acetabulum, max SUV 6.3 (PET image 248)    --Left parasymphyseal region, max SUV 7.5 (PET image 248)    --Right posterior column, max SUV 5.4 (PET image 253)   IMPRESSION:  Multifocal hypermetabolic lytic osseous lesions,  as described above,  including a dominant 2.8 x 1.8 cm lesion in the left  acetabulum/posterior column. This appearance is compatible with  suspected multiple myeloma, less likely lytic metastases related to  an unknown primary.    7 mm short axis hypermetabolic right paratracheal node,  indeterminate, favored to be reactive in the absence of additional  lymphadenopathy.      Electronically Signed    By: Julian Hy M.D.    On: 09/26/2013 15:12     Verified By: Julian Hy, M.D.,   Relevent Results:   Relevant Scans and Labs bone survey and PET CT scan reviewed   Assessment and Plan: Impression:   multiple myeloma with multiple lytic bone lesions causing significant lower back pain with large mass in the sacral region for palliative radiation therapy. Plan:   at this time is to go ahead with palliative radiation therapy to his sacrum. There is a large lytic destructive mass by PET CT criteria in that region. We'll plan on delivering 3000 cGy in 10 fractions. I have set him up for CT simulation tomorrow will start treatments this week. Risks and benefits of treatment including possible urinary  frequency and urgency secondary to bladder irradiation, possible diarrhea, possible fatigue, possible alteration of blood counts were explained in detail to the patient. Once we get some palliative control of his pain in his back may offer palliative treatment to his bilateral acetabular regions to prevent any pathologic fracture down the road. Patient seems to comprehend my treatment plan well.  I would like to take this opportunity for allowing me to participate in the care of your patient..  Electronic Signatures: Kaelyn Innocent, Roda Shutters (MD)  (Signed 22-Sep-15 11:33)  Authored: HPI, Diagnosis, Past Hx, PFSH, Allergies, Home Meds, ROS, Physical Exam, Other Results, Relevent Results, Encounter Assessment and Plan   Last Updated: 22-Sep-15 11:33 by Armstead Peaks (MD)

## 2014-05-16 NOTE — Consult Note (Signed)
PATIENT NAME:  [name], [name] MR#:  [id number] DATE OF BIRTH:  March 20, 1960  DATE OF CONSULTATION:  10/14/2013  REFERRING PHYSICIAN:  Dr. Vianne Bulls.  CONSULTING PHYSICIAN:  Kaelum Kissick R. Ma Hillock, MD  REASON FOR CONSULTATION: Multiple myeloma.   HISTORY OF PRESENT ILLNESS: The patient is a 54 year old gentleman with history of recently diagnosed multiple myeloma with multiple lytic bone lesions and severe pain issues, he was diagnosed by bone marrow biopsy on September 8, and started on treatment with Velcade and Decadron. He is waiting to start on oral Revlimid. The patient was recently hospitalized a few days ago for pain control, but he signed out [place]. He then returned to Emergency Room with complaints of severe uncontrolled low back pain. Currently he is on fentanyl patch and on morphine PCA along with Dilaudid p.r.n., states the pain is under much better control. Main area of pain is in the lower back. No fevers. Appetite is fair. Eating better today. Chronic weakness and fatigue is the same.   PAST MEDICAL HISTORY: Multiple myeloma, chronic smoking. History of cardiomyopathy and alcohol usage, EF was less than 20% in 2014 but more recently it was better reportedly around 70%. History of recreational drug usage.   FAMILY HISTORY: Noncontributory.   SOCIAL HISTORY: Chronic smoker 1 pack per day. Denies alcohol usage. Has history of recreational drug usage.   ALLERGIES: No known drug allergies.   HOME MEDICATIONS: Advair Diskus 100/50 puff b.i.d., amiodarone 400 mg daily, Coreg 12.5 mg b.i.d., Decadron 40 mg weekly, fentanyl patch 25 mcg per hour q. 72 hours, lisinopril 5 mg daily, Phenergan 25 mg q. 6 hours p.r.n. for nausea, oxycodone 5 to 10 mg every 3 to 4 hours p.r.n. for pain Ventolin inhaler p.r.n.   REVIEW OF SYSTEMS:  CONSTITUTIONAL: As in history of present illness. No fever or chills.  HEENT: Currently denies any headaches, epistaxis, ear or jaw pain. No sinus symptoms.   CARDIAC: Denies any angina, palpitation, orthopnea, or PND.  LUNGS: Has chronic dyspnea on exertion. Denies any cough or hemoptysis.  GASTROINTESTINAL: No nausea, vomiting, or diarrhea. Tends to constipate. No bright red blood in stools or melena.  GENITOURINARY: No dysuria or hematuria.  MUSCULOSKELETAL: Severe bone or back pains from recent myeloma diagnosis.  EXTREMITIES: Has mild swelling no new pain.  NEUROLOGIC: No new focal weakness, seizures or loss of consciousness.  ENDOCRINE: No polyuria or polydipsia.   PHYSICAL EXAMINATION:  GENERAL: The patient is sitting in bed, alert and oriented and converses appropriately, in no acute distress. No icterus. Pallor present.  VITAL SIGNS: 98.1, 84, 20, 112/71, 90% on room air.  HEENT: Normocephalic, atraumatic. Extraocular movements intact. Sclerae anicteric.  NECK: Negative for lymphadenopathy.  S1, S2, regular rate and rhythm.  LUNGS: Show bilateral good breath sounds. No crepitations or rhonchi.  ABDOMEN: Soft, nontender. No organomegaly.  EXTREMITIES: Trace edema.  SKIN: No generalized rashes or major bruising.  NEUROLOGIC: Grossly nonfocal, cranial nerves intact.   LABORATORY RESULTS:  WBC 10,100, hemoglobin 8.4, platelet 444,000, ANC 7,900, creatinine 1.19, calcium 7.5, glucose 87.   IMPRESSION AND RECOMMENDATION:  42. A 54 year old gentleman with recently diagnosed aggressive myeloma with multiple lytic bone lesions with severe pain issues mostly in the lower back. The patient currently is on fentanyl and morphine PCA along with Dilaudid p.r.n., pain is under better control today. Given severe low back pain, we will consult radiation oncologist, Dr. Baruch Gouty to see if palliative radiation can help with better pain control. Otherwise, agree with ongoing  pain medication. The patient was due for Velcade treatment and we will see if he can start back on this tomorrow. Renal function is doing well. No hypercalcemia. He is status post recent  Zometa treatment. He is also awaiting starting oral Revlimid for myeloma treatment.  2. 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 We will continue to follow.   Thank you for the referral. Please feel free to contact me if any additional questions.    ____________________________ Rhett Bannister Ma Hillock, MD srp:[id number] D: 10/14/2013 22:52:21 ET T: 10/14/2013 23:09:43 ET JOB#: [id number]  cc: Trevor Iha R. Ma Hillock, MD, <Dictator> Alveta Heimlich MD ELECTRONICALLY SIGNED 10/15/2013 15:20

## 2014-05-16 NOTE — Consult Note (Signed)
Admit Diagnosis:   ABD PAIN: Onset Date: 26-Sep-2013, Status: Active, Description: ABD PAIN    cad:    Kidney Stones:    Asthma:    defibrillator:   Home Medications: Medication Instructions Status  lisinopril 5 mg oral tablet 0.5 tab(s) orally once a day Active  amiodarone 400 mg oral tablet 1 tab(s) orally once a day Active  aspirin 325 mg oral delayed release tablet 1 tab(s) orally once a day Active  carvedilol 3.125 mg oral tablet 1 tab(s) orally once a day (at bedtime) Active  simvastatin 40 mg oral tablet 1 tab(s) orally once a day (at bedtime) Active  Ventolin HFA CFC free 90 mcg/inh inhalation aerosol 2 puff(s) inhaled 4 times a day Active  Advair Diskus 100 mcg-50 mcg inhalation powder 1 puff(s) inhaled 2 times a day Active   Lab Results: Oncology:  02-Sep-15 21:33   Prostate Specific Ag, Roche ECLIA 0.6 (Roche ECLIA methodology.                                                                      . According to the American Urological Association, Serum PSA should decrease and remain at undetectable levels after radical prostatectomy. The AUA defines biochemical recurrence as an initial PSA value 0.2 ng/mL or greater followed by a subsequent confirmatory PSA value 0.2 ng/mL or greater. Values obtained with different assay methods or kits cannot be used interchangeably. Results cannot beinterpreted as absolute evidence of the presence or absence of malignant disease.            [facility]            No: [id number]           [address] [place], [place] [address]           Lindon Romp, MD         [phone] Result(s) reported on 25 Sep 2013 at 09:49AM.)  Carbohydrate Antigen 19-9 9 [facility] methodology            [place]            No: [id number]           [address], [place], [place] [address]           Lindon Romp, MD         [phone] Result(s) reported on 25 Sep 2013 at 09:49AM.)  CA 19-9 ==========  TEST NAME ==========  ========= RESULTS =========  = REFERENCE RANGE =  CA 19-9   Carcinoembryonic Antigen (CEA) 1.2 (Roche ECLIA methodology       Nonsmokers  <3.9                                                     Smokers     <5.6            [facility]            No: [id number]           [address] [place], [place] [address]  Lindon Romp, MD         [phone] Result(s) reported on 25 Sep 2013 at 09:49AM.)  Carcinoembryonic Antigen ========== TEST NAME ==========  ========= RESULTS =========  = REFERENCE RANGE =  CARCINOEMBRYONIC AB   Routine Micro:  02-Sep-15 21:33   Micro Text Report HIV 1/2 AG AB COMBO   HIV 1/2 ANTIBODIES        NON-REACTIVE ANTIBODY   HIV-1 p24 ANTIGEN         NON-REACTIVE ANTIGEN   INTERPRETATION            NONREACTIVE.  A NONREACTIVE test result means that HIV-1 or HIV-2 antibodies and HIV-1 p24 antigen were not detected in the specimen.   ANTIBIOTIC                       General Ref:  02-Sep-15 21:33   Hepatitis Panel A, B, C ========== TEST NAME ==========  ========= RESULTS =========  = REFERENCE RANGE =  HEPATITIS PANEL A, B, C  HP5+HAVIgM+HBcIgM Hep A Ab, IgM                   [   Negative             ]          Negative Hep A Ab, Total                 [   Positive             ]          Negative HBsAg Screen                    [   Negative             ]          Negative Hep B Core Ab, IgM              [   Negative             ]          Negative Hep B Core Ab, Tot              [   Negative             ]         Negative Hep B Surface Ab, Qual          [   Non Reactive         ]                                                Non Reactive: Inconsistent with immunity,                                            less than 10 mIU/mL            Reactive:     Consistent with immunity,  greater than 9.9 mIU/mL HCV Ab                          [   0.1 s/co ratio       ]            0.0-0.9 Comment:                        [   Final Report    ]                   Non reactive HCV antibody screen is consistent with no HCV infection, unless recent infection is suspected or other evidence exists to indicate HCV infection.               [facility]            No: [id number]          [address], [place], [place] [address]           Lindon Romp, MD         [phone]   Result(s) reported on 25 Sep 2013 at 08:51AM.  Prostate-Specific Antigen ========== TEST NAME ==========  ========= RESULTS =========  = REFERENCE RANGE =  PROSTATE-SPECIFIC AG.   Routine Chem:  04-Sep-15 04:08   Glucose, Serum 72  BUN  23  Creatinine (comp)  1.62  Sodium, Serum  132  Potassium, Serum 4.5  Chloride, Serum 102  CO2, Serum 26  Calcium (Total), Serum 9.2  Anion Gap  4  Osmolality (calc) 267  eGFR (African American)  55  eGFR (Non-African American)  47 (eGFR values <48m/min/1.73 m2 may be an indication of chronic kidney disease (CKD). Calculated eGFR is useful in patients with stable renal function. The eGFR calculation will not be reliable in acutely ill patients when serum creatinine is changing rapidly. It is not useful in  patients on dialysis. The eGFR calculation may not be applicable to patients at the low and high extremes of body sizes, pregnant women, and vegetarians.)  Routine Sero:  02-Sep-15 21:33   - HIV 1/2 Antibodies NON-REACTIVE ANTIBODY  - HIV-1 p24 Antigen NON-REACTIVE ANTIGEN  - Interpretation NONREACTIVE.  A NONREACTIVE test result means that HIV-1 or HIV-2 antibodies and HIV-1 p24 antigen were not detected in the specimen.  Result(s) reported on 24 Sep 2013 at 10:04PM.  Routine Hem:  04-Sep-15 04:08   WBC (CBC) 3.9  RBC (CBC)  2.38  Hemoglobin (CBC)  8.1  Hematocrit (CBC)  24.2  Platelet Count (CBC) 250  MCV  102  MCH  34.2  MCHC 33.6  RDW  14.8  Neutrophil % 57.8  Lymphocyte % 26.2  Monocyte % 14.8  Eosinophil % 0.9   Basophil % 0.3  Neutrophil # 2.3  Lymphocyte # 1.0  Monocyte # 0.6  Eosinophil # 0.0  Basophil # 0.0 (Result(s) reported on 26 Sep 2013 at 05:23AM.)   Radiology Results:  Radiology Results: LabUnknown:    02-Sep-15 19:00, CT Abdomen and Pelvis With Contrast  PACS Image  CT:  CT Abdomen and Pelvis With Contrast  REASON FOR EXAM:    (1) severe diffuse abd pain; (2) severe diffuse abd   pain  COMMENTS:       PROCEDURE: CT  - CT ABDOMEN / PELVIS  W  - Sep 24 2013  7:00PM     CLINICAL DATA:  Pain.    EXAM:  CT ABDOMEN AND PELVIS WITH CONTRAST    TECHNIQUE:  Multidetector CT imaging of the abdomen and pelvis was performed  using the standard protocol following bolus administration of  intravenous contrast.  CONTRAST:  80 cc Isovue-300.    COMPARISON:  None.    FINDINGS:  Punctate lucency anterior aspect righthepatic lobe most likely tiny  cyst. No significant hepatic abnormality identified. Spleen normal.  Pancreas normal. No biliary distention. Gallbladder nondistended.    Adrenals normal. No focal renal abnormality. No hydronephrosis or  obstructing ureteral stone. The bladder is nondistended. Severe  prostate prominence is present. This could be from BPH, prostatitis,  or prostate malignancy. The seminal vesicles are prominent.    No significant adenopathy. Duplicated IVC is noted. This is a normal  variant. Aorta and its branch vessels are patent. The portal vein is  patent.    Appendix is difficult to visualize but what appears to be the  appendix is normal. Mild prominence of small and large bowel loops  noted suggesting mild adynamic ileus. Mild focal thickening noted of  the left colon, image number 30/series 2. This may represent focal  peristalsis however a fixed lesion cannot be excluded. When the  patient is capable further evaluation of the colon with barium enema  or colonoscopyshould be considered. Diverticulosis noted. No  inflammatory stranding noted  to suggest diverticulitis. No free air.  No mesenteric mass. No abdominal wall hernia. The stomach is  nondistended. Small sliding hiatal hernia.    Cardiac pacer. Heart size normal. Mild basilar atelectasis left lung  base. Diffuse severe osteopenia. Although these changes may be  related to osteoporosis/ osteomalacia, a malignant process including  hematologic malignancy/ multiple myeloma cannot be excluded and  serum studies suggested for further evaluation. There is minimal  compression of L2.     IMPRESSION:  1. Severe prostate prominence. This could be from BPH. Prostatitis  or prostate malignancy could also present this fashion. The seminal  vesicles are prominent.  2. Mild prominence of small large bowel loops noted suggesting mild  adynamic ileus.  3. Mild focal thickening of the left colon. This may represent focal  peristalsis however fixed lesion cannot be excluded. When the  patient is clinically capable further evaluation: Barium enema or  colonoscopy should be considered.  4. Mild diverticulosis.  No evidence of diverticulitis.  5. Severe osteopenia. Although these changes could be secondary to  osteoporosis/osteomalacia, a malignant process including hematologic  malignancy/multiple myeloma cannot be excluded. Serum studies  suggested for further evaluation.  6. Minimal compression L2.      Electronically Signed    By: Marcello Moores  Register    On: 09/24/2013 19:31         Verified By: Osa Craver, M.D., MD    No Known Allergies:   Nursing Flowsheets: **Vital Signs.:   04-Sep-15 13:48  Vital Signs Type Routine  Temperature Temperature (F) 97.7  Celsius 36.5  Temperature Source oral  Pulse Pulse 96  Respirations Respirations 22  Systolic BP Systolic BP 503  Diastolic BP (mmHg) Diastolic BP (mmHg) 74  Mean BP 82  Pulse Ox Activity Level  At rest  Oxygen Delivery Room Air/ 21 %  *Intake and Output.:   Daily 04-Sep-15 07:00  Grand Totals Intake:   720 Output:      Net:  720 24 Hr.:  720  Oral Intake      In:  720  Length of Stay Totals Intake:  720 Output:  Net:  [name] is a 55 year old man with multiple medical issues (history of dilated cardiomyopathy with EF of 20% status post AICD, continued polysubstance abuse including cocaine, previous history of left ventricular thrombus treated with anticoagulation) and a history of kidney stones who is admitted to the medical service for diffuse abdominal pain and back pain, mostly on the right side.  CT showed a prominent prostate, and Urology was consulted to determine whether prostatitis or prostate malignancy was the cause of his symptoms.   Present Illness He denies significant LUTS. No urinary hesitancy, intermittency of stream, weak stream, frequency or urgency. He has nocturia x 1.  He denies hematuria or recurrent UTIs.  He said his left testicle was swollen and tender for the past several days but this has since completely resolved without intervention.  He has lost about 60 lbs unintentionally over the past year and has a poor appetite but currently denies nausea/vomiting or fevers/chills.   SOCIAL HISTORY: Tobacco use: 1-2 packs/day. Uses cocaine and MJ regularly. Drinks EtOH occasionally. Married, lives with his wife.   Case History and Physical Exam:  Past Medical Health Coronary Artery Disease, Smoking, Alcohol Consumption/Dependency, COPD, Dilated Cardiomyopathy, Kidney stones, Anxiety   Past Surgical History AICD placement   Family History Mother with COPD. Father died of heart attack in his 81s.   HEENT PERLA   Neck/Nodes Supple   Chest/Lungs Clear   Cardiovascular No Murmurs or Gallops   Abdomen Active bowel sounds  soft   Genitalia WNL  Normal phallus, normal scrotum and testes without masses.   Rectal WNL  Prostate 50g, smooth, symmetric, non-tender   Musculoskeletal Right low back pain, focally tender over right iliac crest    Neurological Grossly WNL   Skin Warm  Dry    Impression 54 year old man with right back pain, weight loss and lytic bone lesions with CT report of an enlarged prostate.  On my physical exam and read of the CT, his prostate, while somewhat large, does not look or feel abnormal. His PSA is low at 0.6, making prostate malignancy in his case very unlikely.  In rare cases of very advanced prostate cancer, PSAs can begin to drop, but I do not think this is the case.  Significant prostatitis is also unlikely with a normal prostate exam, normal U/A and lack of significant urinary symptoms.   Plan 1) I discussed these findings with his Internist, who agrees that prostate pathology is the unlikely cause of his symptoms, and will need further workup of his weight loss and lytic bone lesions. 2) No need for urologic surgical or medical intervention at this time.  Thank you for involving me in this patients care. Please contact Urology if we can be of any further assistance.   Electronic Signatures: Prentiss Bells (MD)  (Signed 04-Sep-15 15:18)  Authored: Health Issues, Significant Events - History, Home Medications, Labs, Radiology Results, Allergies, Vital Signs, General Aspect/Present Illness, History and Physical Exam, Impression/Plan   Last Updated: 04-Sep-15 15:18 by Prentiss Bells (MD)

## 2014-05-16 NOTE — Discharge Summary (Signed)
PATIENT NAME:  [name], [name] MR#:  [id number] DATE OF BIRTH:  [birth year]  DATE OF ADMISSION:  10/10/2013 DATE OF DISCHARGE:  10/11/2013  HISTORY OF PRESENT ILLNESS:  This is a 54 year old male with recently diagnosed multiple myeloma and multiple bone lesions, dilated cardiomyopathy with ejection fraction of 20%, COPD, and coronary artery disease, who presented with worsening of back and leg pain. X-ray showed L2 and T9 compression and deformity. He was admitted for pain control. He was taking oxycodone every 3 to 4 hours at home and fentanyl Duragesic patch.   HOSPITAL COURSE AND STAY:  Back pain and leg pain likely was compression fracture in the lumbar area. The patient had pacemaker, so MRI could not be done. He was started on PCA pump for better pain control.  orthopedic and oncology consult. He was started on oral medications for better pain control. Other issues like multiple myeloma, elevated troponin, and hypertension remained stable in the hospital. Elevated troponin remained stable when followed 3 times subsequently. The patient's pain was controlled, and so he decided to leave against medical advice the next day without getting further instructions.   DISCHARGE DIAGNOSIS:  As mentioned, the patient left against medical advice, so the diagnosis for his discharge was back pain.  TOTAL TIME SPENT ON THIS DISCHARGE SUMMARY:  30 minutes.    ____________________________ Ceasar Lund Anselm Jungling, MD vgv:nb D: 11/04/2013 21:58:59 ET T: 11/05/2013 00:32:31 ET JOB#: [id number]  cc: Ceasar Lund. Anselm Jungling, MD, <Dictator> [place] MD ELECTRONICALLY SIGNED 11/11/2013 11:37

## 2014-05-16 NOTE — H&P (Signed)
PATIENT NAME:  KENNEY, GOING MR#:  [id number] DATE OF BIRTH:  11-Oct-1960  DATE OF ADMISSION:  10/10/2013  REFERRING EMERGENCY ROOM PHYSICIAN: Dr. Ferman Hamming.  PRIMARY CARE PHYSICIAN: None.   STAT HISTORY AND PHYSICAL  CHIEF COMPLAINT: Back and left leg pain.   HISTORY OF PRESENT ILLNESS: This very pleasant 54 year old man with recently diagnosed multiple myeloma with multiple bone lesions, dilated cardiomyopathy with ejection fraction of 20%, COPD, and coronary artery disease, presents today with worsening of back and left leg pain. X-ray shows L2 and T9 compression deformities. He is admitted for pain control. At home he has been taking oxycodone 5 mg every 3-4 hours as needed for pain as well as fentanyl Duragesic patch 12 mcg per hour transdermal film, with moderate relief until this morning when he had acute intolerable worsening of the back and left leg pain.   PAST MEDICAL HISTORY:  1. Multiple myeloma diagnosed September 2015 followed by Dr. Leia Alf just starting chemotherapy.  2. Dilated cardiomyopathy with ejection fraction of 20% to 25% status post AICD placement.  3. Coronary artery disease with nonobstructive disease shown on left heart catheterization.  4. Asthma.  5. COPD. 6. History of polysubstance abuse with cocaine.  7. Anxiety.  8. Ongoing tobacco abuse.   PAST SURGICAL HISTORY: AICD placement.  ALLERGIES: No known drug allergies.   HOME MEDICATIONS:  1. Ventolin HFA CFC free 90 mcg/inhalations inhaler 2 puffs inhaled 4 times a day.  2. Roxicodone 5 mg 1 to 2 tablets orally every 3-4 hours as needed for pain.  3. Promethazine 25 mg 1 tablet every 6 hours as needed for nausea or vomiting.  4. Lisinopril 5 mg 1 tablet daily.  5. Fentanyl Duragesic patch 12 mcg per hour transdermal film, 1 patch every 72 hours.  6. Dexamethasone 4 mg, take 10 tablets once a week starting on 10/08/2013. 7. Carvedilol 12.5 mg 1 tablet twice a day.  8. Amiodarone 400 mg 1  tablet once a day.  9. Advair Diskus 100 mcg/50 mcg inhalation powder, 1 puff inhaled 2 times a day.   SOCIAL HISTORY: The patient lives with his wife. He continues to smoke cigarettes though is trying to decrease after recent diagnosis. He does have recent cocaine use within the past month. Also marijuana use. Denies regular alcohol use, none since last admission.   FAMILY MEDICAL HISTORY: Mother had COPD. Father died of a heart attack in his 3s.   REVIEW OF SYSTEMS:  CONSTITUTIONAL: Negative for fevers, chills, change in weight since last admission, weakness.  HEENT: No change in vision or hearing. No pain in the eyes or ears. No trouble swallowing. No pain in the mouth.  RESPIRATORY: No shortness of breath, wheezing, orthopnea, cough.  CARDIOVASCULAR: No palpitations, chest pain, syncope, edema.  GASTROINTESTINAL: No nausea, vomiting, diarrhea, hematemesis, hematochezia, though he does report a decreased appetite.  GENITOURINARY: Negative for dysuria or frequency.  MUSCULOSKELETAL: Positive for pain in the back and left leg as well as diffuse pains over the ribs and right chest.  NEUROLOGIC: No focal numbness or weakness. No headaches, seizure, confusion.  PHYSICAL EXAMINATION:  VITAL SIGNS: Temperature 97.7, pulse 70, respirations 20, blood pressure 110/66, oxygenation 97% on room air.  GENERAL: The patient is very uncomfortable, very still in the bed and in acute pain 10/10 with movement.  HEENT: Pupils are equal and round, and reactive to light. Conjunctivae are clear. No icterus. No injection. Extraocular motion is intact. Mucous membranes are pink and moist, fair  dentition.  NECK: Supple. There is anterior cervical lymphadenopathy bilaterally. Trachea is midline. Thyroid is nontender. No thyroid nodule is noted.  RESPIRATORY: Lungs are clear to auscultation bilaterally with good air movement.  CARDIOVASCULAR: There is a pacemaker/AICD in place in the left chest, regular rate and  rhythm, no murmurs, rubs, or gallops, no peripheral edema. Peripheral pulses are 2+. No JVD.  ABDOMEN: Soft, nontender, nondistended. Bowel sounds are normal. No guarding, no rebound, no hepatosplenomegaly. No mass.  EXTREMITIES: There are no hot, tender or swollen joints. Range of motion in the upper extremities is full. The patient is very reluctant to move his back or left leg, strength seems to be intact with the exception of pain.  NEUROLOGIC: Cranial nerves II through XII are grossly intact. Strength and sensation are intact, nonfocal neurologic examination.  PSYCHIATRIC: The patient is very anxious in intense pain.   LABORATORY DATA: Sodium 129, potassium 4.0, chloride 101. Bicarbonate 22, BUN 18, creatinine 1.2, calcium 6.9, total protein 11.1, albumin 2.2, bilirubin 0.1. Troponin elevated at 0.09, 0.09, and again elevated at 0.10. White blood cells 10.5, hemoglobin 7.9, hematocrit 23, platelets 456,000, MCV is 110.   ASSESSMENT AND PLAN:  1. Back and left leg pain, likely due to compression fracture in the lumbar area. Unfortunately, the patient has a pacemaker so MRI is not possible for further evaluation. At this time, we have started him on a PCA pump to control his pain. I do not think given his multiple bony lesions that he would be a good candidate for kyphoplasty. This could be discussed with orthopedics, radiology, and his oncologist. At this point, there is no weakness in the leg.  2. Multiple myeloma: This is being followed by Dr. Ma Hillock, who  I have spoken with today. The patient hopes to continue with chemotherapy and have IV chemotherapy in the future.  3. Elevated troponins: Currently the patient has no chest pain. No EKG changes. Have admitted to telemetry. He had elevated troponins during his last admission and was seen by Dr. Humphrey Rolls at that time. If troponins continue to elevate, would consult cardiology.  4. Hypertension: The patient is currently not hypertensive. Will continue  home regimen.  5. Progressive anemia: This is likely due to multiple myeloma.  6. Hyponatremia: This is likely due to multiple myeloma and elevated serum protein.   DISPOSITION: I have asked palliative care to see this patient for assistance with pain control and also for goals of care. Currently the patient wishes to remain a full code. He does not want to discuss end-of-life planning.   TIME SPENT ON ADMISSION: 45 minutes.    ____________________________ Earleen Newport. Volanda Napoleon, MD cpw:lt D: 10/10/2013 20:48:01 ET T: 10/10/2013 21:28:11 ET JOB#: [id number]  cc: Earleen Newport. Volanda Napoleon, MD, <Dictator> Aldean Jewett MD ELECTRONICALLY SIGNED 10/11/2013 12:37

## 2014-05-16 NOTE — Discharge Summary (Signed)
PATIENT NAME:  [name], [name] MR#:  [id number] DATE OF BIRTH:  Apr 28, 1960  DATE OF ADMISSION:  10/25/2013 DATE OF DISCHARGE:  10/26/2013  PRIMARY CARE PHYSICIAN: Nonlocal.   ONCOLOGIST: Sandeep R. Ma Hillock, MD  FINAL DIAGNOSES:  1.  Pneumonia.  2.  Relative hypotension.  3.  Multiple myeloma.  4.  History of chronic obstructive pulmonary disease.  5.  History of coronary artery disease.  6.  Compression fracture seen on T9.   MEDICATIONS ON DISCHARGE: Include Ventolin HFA 2 puffs 4 times a day, Advair Diskus 150 one puff twice a day, promethazine 25 mg every 6 hours as needed for nausea and vomiting, Xanax 0.5 mg 1 capsule twice a day. Revlimid 25 mg 1 capsule once a day, on 3 weeks and off 1 week. Hydromorphone 2 mg every 4 hours as needed for severe pain, senna 1 tablet daily, MiraLax 17 grams as needed for constipation, amiodarone 200 mg daily, fentanyl 100 mcg topically every 3 days, Colace 100 mg 2 capsules twice a day for 30 days, aspirin 325 mg daily, Augmentin 875 mg twice a day for 9 days. Prednisone taper 5 mg 4 tablets day one, 3 tablets day two, 2 tablets day three, 1 tablet days four and five.   DIET: Regular diet, regular consistency.   ACTIVITY: As tolerated.   FOLLOWUP: With Dr. Ma Hillock as scheduled and your medical doctor as scheduled.   HOSPITAL COURSE: The patient was admitted 10/25/2013 and discharged 10/26/2013. Came in with chest pain. The patient has multiple myeloma, developed chest pain after coughing, was found to have a pneumonia on CT scan. The patient was started on Levaquin and cefepime initially. For COPD, he was started on Solu-Medrol. The patient was also hypoxic initially and was given oxygen supplementation, although I am not seeing documentation of this hypoxia on presentation to the ER.   LABORATORY AND RADIOLOGICAL DATA DURING THE HOSPITAL COURSE: Included white blood cell count of 6.6, H and H 8.6 and 25.6, platelet count of 373,000, actually CBC that was  a week ago; on admission white blood cell count 5.6, H and H 8.1 and 25.0, platelet count of 246,000. INR 1.2, lipase 45. EKG normal sinus rhythm, LVH. Troponin negative. Glucose 80, BUN 17, creatinine 1.04, sodium 132, potassium 4.2, chloride 100, CO2 of 28, calcium 8.7. Liver function tests: Alkaline phosphatase 37, ALT 14, AST 18, total protein 9.9. Albumin low at 2.3. Chest x-ray showed no acute cardiopulmonary disease. Right ribs: No rib fractures identified. CT chest: No evidence of pulmonary embolism, diffuse myomatous involvement of the visual skeleton with pathological compression fracture of T9 vertebral body. Patchy ground glass opacity right upper lobe, potential infectious or inflammatory. Recommend repeat CT scan in 3 months to evaluate for persistent 3 mm left upper lobe pulmonary nodule, recommend followup on that. Creatinine upon discharge 1.06, sodium 131.   HOSPITAL COURSE PER PROBLEM LIST:  1.  For the patient's pneumonia, the patient initially was started on aggressive antibiotics. The patient was feeling fine when I saw him and wanted to go home. Pulse oximetry with ambulation 93% on room air. Since the patient's major complaint was chest pain with coughing, I do not believe the pneumonia was the major issue. I switched him over to Augmentin for the upper lobe pneumonia to cover for aspiration just in case.  2.  Relative hypotension. I held his Coreg and lisinopril. Blood pressure systolic around 037.  3.  Multiple myeloma. Following up with Dr. Ma Hillock as outpatient.  4.  History of COPD. I gave a quick prednisone taper. His chest pain seemed to be better with the IV steroids, switched over to oral steroids for discharge.  5.  History of coronary artery disease. Continue aspirin.  6.  Compression fracture seen on T9. The patient was moving his lower extremities well. Follow up with Dr. Ma Hillock for this. This is likely from multiple myeloma.  7.  Anemia. Follow up as an outpatient with  Dr. Ma Hillock. No signs of bleeding on this hospital stay. The patient has been anemic in the past.  TIME SPENT ON DISCHARGE: Thirty-five minutes.    ____________________________ Tana Conch. Leslye Peer, MD rjw:TT D: 10/27/2013 14:20:03 ET T: 10/27/2013 16:21:55 ET JOB#: [id number]  cc: Tana Conch. Leslye Peer, MD, <Dictator> Sandeep R. Ma Hillock, MD Marisue Brooklyn MD ELECTRONICALLY SIGNED 11/09/2013 12:47

## 2014-05-17 NOTE — Consult Note (Signed)
Brief Consult Note: Diagnosis: Chest pain, possible NSTEMI in patient with increased SOB and CXR suggestive of LLL pneumonia.   Consult note dictated.   Recommend further assessment or treatment.   Orders entered.   Comments: Patient with worsening SOB over the last 3 weeks, had squeezing/pressure like pain in chest last night and has elevated TNI on 1st set of enzymes, no acute ST elevation on EKG. He has recent cocaine use, CXR changes c/w LLL pneumonia, and elevated BNP suggestive of possible CHF. Echo ordered to check wall motion and check LVEF. Will hold Lovenox in anticipation of possible cardiac cath tomorrow 4/25 (got Lovenox at 4am) Patient on nitro patch, Plavix, Coreg and patient CP free at this time. Will follow patient with you. Defer treatment of probable pneumonia to hospitalist.  Electronic Signatures: Angelica Ran (MD)   (Signed 24-Apr-13 09:12)  Co-Signer: Brief Consult Note Kreg Earhart, Apolonio Schneiders (PA-C)   (Signed 24-Apr-13 08:43)  Authored: Brief Consult Note  Last Updated: 24-Apr-13 09:12 by Angelica Ran (MD)

## 2014-05-17 NOTE — Consult Note (Signed)
PATIENT NAME:  [name], [name] MR#:  [id number] DATE OF BIRTH:  [birth year]  DATE OF CONSULTATION:  06/28/2011  REFERRING PHYSICIAN:  Alounthith Phichith, MD  CONSULTING PHYSICIAN:  Neoma Laming, MD/Jarae Panas Elease Etienne, PA-C PRIMARY CARE PHYSICIAN: [facility]   REASON FOR CONSULTATION: Shortness of breath, chest pain.   HISTORY OF PRESENT ILLNESS: Dianne Bady is a 54 year old white male who is known to our service. He was seen during previous hospitalization in April where he was found to have cardiomyopathy with an ejection fraction less than 25% and elevated troponin. At that time, his symptoms were thought to be from a combination of pneumonia/chronic obstructive pulmonary disease. It was thought that elevations at that time of his troponin I were from demand ischemia. The patient does have a history of alcohol and drug use, and it was thought his cardiomyopathy was secondary to these agents.   The patient comes in today since he has had increased shortness of breath over the last several days along with chest tightness and pressure. The chest pain does not seem to be any worse with exertion, and the pain does not radiate to the jaw or arm. He has associated dry cough, orthopnea, but denies any leg edema. He notes that he was taking his medication as prescribed, however, they made him feel very weak.    PAST MEDICAL HISTORY:  1. Chronic obstructive pulmonary disease. 2. Polysubstance abuse, former heavy alcohol use and quit about one year ago. He last used cocaine in March 2013.  3. Cardiomyopathy with ejection fraction less than 25%. He had an echocardiogram in April 2013 with severe dilated left ventricle, moderate mitral regurgitation, and small atrial septal defect with left-to-right shunt. He is currently wearing his [place].  4. Depression/anxiety.   PAST SURGICAL HISTORY: None.   ALLERGIES: No known drug allergies.     HOME MEDICATIONS:  1. Carvedilol 3.25 mg p.o. twice  daily.  2. Nitrostat 0.4 mg, dissolve under the tongue as needed.  3. Aspirin 325 mg p.o. daily.  4. Nicotine patch 14 mcg apply to skin daily.  5. Zocor 40 mg p.o. at bedtime.  6. Symbicort 160/4.5, 2 puffs inhaled twice daily.  7. Spiriva, use as directed daily.  8. Lisinopril 2.5 mg p.o. daily.  9. Thiamine 100 mg p.o. daily.  10. Folic acid 1 mg p.o. daily.  11. Digoxin 125 mcg p.o. daily.  12. Albuterol 2 puffs every four hours as needed.   FAMILY HISTORY: Mother with chronic obstructive pulmonary disease, father with myocardial infarction in his 47s.   SOCIAL HISTORY: The patient lives in [place] with his mother. He smokes a pack of cigarettes every two days but was previously smoking 1 pack per day. He drinks half a beer on occasion but has a history of former heavy alcohol use. He notes his last use of cocaine was March of 2013.   REVIEW OF SYSTEMS: The patient complains of weakness, shortness of breath, nausea, orthopnea. Denies any fever.   PHYSICAL EXAMINATION:  GENERAL: This is a pleasant male who is not in any acute distress. He is alert and oriented x3.   VITAL SIGNS: Temperature 98.6, respiratory rate is 25, blood pressure 109/62, oxygen saturation is 94% on room air.   HEENT: Head: Atraumatic, normocephalic. Eyes: Pupils are round and equal. Conjunctivae are pink. Ears and nose are normal to external inspection. Mouth: Good dentition. Moist mucous membranes.   NECK: Supple. Trachea is midline. Thyroid is smooth and mobile.  There are no carotid bruits.   LUNGS: The patient has diminished breath sounds bilaterally with some scattered wheezes.   CARDIOVASCULAR: Regular rate and rhythm. Soft systolic murmur heard loudest over the apex.   ABDOMEN: Nondistended and soft. Bowel sounds are present. He has mild epigastric tenderness. There is no rebound tenderness, guarding or peritoneal signs.   EXTREMITIES: No cyanosis, clubbing, or edema.    LABORATORY,  DIAGNOSTIC AND RADIOLOGICAL DATA:  Glucose is 99. BNP is 5359.  BUN 11, creatinine 0.91, sodium 139, potassium 4.4, chloride 108, CO2 23, estimated GFR is greater than 60.0.  Troponin I is 0.75, followed by 0.76.  Thyroid stimulating hormone 1.32, digoxin 0.25.  Urine drug screen is positive for benzodiazepines and cannabinoids. White blood cell count is 6.8, hemoglobin 13.4, hematocrit 39.7, platelet count 189,000.  Chest CT: No evidence of acute pulmonary embolism, mild hyperinflation which suggests chronic obstructive pulmonary disease, bibasilar atelectasis, fibrotic change in the apices most conspicuously on the right, small bilateral pleural effusions, cardiac chambers are upper limit of normal in size.  Chest x-ray:  No acute disease of the chest.  EKG: Sinus tachycardia, 114 beats per minute, nonspecific ST changes with changes consistent with left ventricular hypertrophy.   ASSESSMENT/PLAN:  1. Shortness of breath: The patient's increased shortness of breath could be secondary to his chronic obstructive pulmonary disease and tobacco abuse in conjunction with his known cardiomyopathy with ejection fraction less than 25%. Due to his hypotension, he has had his beta blocker and ACE inhibitor held.  2. Chest pressure in the setting of elevated troponin I: We will need to rule out coronary artery disease as the patient has some elevation of his troponin I and some nonspecific ST changes on EKG. We will set up the patient for cardiac catheterization to be done tomorrow. The risks and benefits of the procedure were discussed with the patient in detail. He has had aspirin started and nitroglycerin as needed, oxygen, is on digoxin and Zocor.  3. Polysubstance abuse: The patient is currently on a nicotine patch, and he was counseled against cessation of all alcohol products, illicit drugs, and nicotine substances.    Thank you very much for allowing [place] to participate in this patient's care. The case  was discussed with Dr. Neoma Laming, who agrees with the assessment and management of the patient as mentioned above.   ____________________________ Merla Riches, PA-C mam:cbb D: 06/28/2011 12:02:04 ET T: 06/28/2011 13:01:03 ET JOB#: [id number]  cc: Merla Riches, PA-C, <Dictator> [facility] Rodert Hinch A [facility] PA ELECTRONICALLY SIGNED 06/29/2011 15:57

## 2014-05-17 NOTE — Discharge Summary (Signed)
PATIENT NAME:  [name], HOLLEY MR#:  [id number] DATE OF BIRTH:  1961-01-16  DATE OF ADMISSION:  06/28/2011 DATE OF DISCHARGE:  06/30/2011  PRIMARY CARE PHYSICIAN: [facility].   CARDIOLOGIST: Neoma Laming, MD   DISCHARGE DIAGNOSES:  1. Chronic systolic congestive heart failure with ejection fraction of 5% and a nonischemic cardiomyopathy.  2. Polysubstance abuse, including tobacco abuse.  3. Chronic obstructive pulmonary disease.  4. Gastroesophageal reflux disease.  5. Tick bite.   MEDICATIONS ON DISCHARGE:  1. Coreg 6.25 mg 1/2 tablet b.i.d.  2. Ventolin inhaler 2 puffs every 4 hours as needed.  3. Advair Diskus 100/50, 1 inhalation b.i.d.   4. Nitroglycerin sublingually as needed.  5. Digoxin 0.125 mg, 1 tablet daily.  6. Lisinopril 5 mg, 1/2 tablet daily.  7. Simvastatin 40 mg at bedtime.  8. Aspirin 325 mg daily.  9. Vitamin B 1 daily.  10. Nicotine patch 14 mcg chest wall daily.  11. Lasix 20 mg p.o. daily.  12. Levbid 0.375 mg p.o. b.i.d.   13. Omeprazole 20 mg p.o. daily.  14. Doxycycline 100 mg p.o. b.i.d. for two doses only.   NOTE: Do not take Xanax. Do not take pain medications.   DISCHARGE INSTRUCTIONS:  1. Do not smoke cigarettes.  2. Diet: Low sodium diet.  3. Activity: Activity as tolerated.  4. Follow-up with Dr. Humphrey Rolls, Cardiology next week.  5. Follow up with the [facility] as scheduled.   REASON FOR ADMISSION: The patient was admitted in 06/28/2011 and discharged 06/30/2011. The patient came in with shortness of breath and chest pressure.   HISTORY OF PRESENT ILLNESS: The patient is a 54 year old man with a recent hospitalization in April diagnosed at that time with cardiomyopathy and chronic obstructive pulmonary disease and polysubstance abuse. The patient wears an external defibrillator. On arrival, he was found to be tachycardic, hypotensive, and came in with shortness of breath and chest pressure. He was admitted to the hospital. Cardiology  consultation was done.   HOSPITAL LABORATORY, DIAGNOSTIC AND RADIOLOGICAL DATA:  EKG showed sinus tachycardia, left atrial enlargement, left ventricular hypertrophy.  Digoxin level of 0.25. BNP 5359. Troponin borderline at 0.75.  Glucose 99, BUN 11, creatinine 0.91, sodium 139, potassium 4.4, chloride 109, CO2 23, calcium 8.8.  White blood cell count 6.8, hemoglobin and hematocrit 13.4 and 39.7, platelet count of 189. Chest x-ray showed no acute disease in the chest.  CT scan of the chest showed no evidence of pulmonary emboli. Cardiac chambers enlarged. Cannot exclude minimal interstitial edema. Hyperinflation of the lung. Fibrotic changes in the apices. Small bilateral pleural effusions. Urine toxicology positive for benzodiazepines, positive for cannabis.  Troponin 0.76. TSH 1.32.  Next troponin 0.73.   Ethanol level negative.  Magnesium was 1.5.  Cardiac catheterization showed an ejection fraction of 5%. No coronary artery disease. Creatinine upon discharge 1.0.   HOSPITAL COURSE PER PROBLEM LIST:  1. Chronic systolic congestive heart failure with an ejection fraction of 5% and nonischemic cardiomyopathy: He was seen in consultation by Dr. Humphrey Rolls, Cardiology, who performed a cardiac catheterization, ejection fraction of 5% on the cardiac catheterization, and normal coronary arteries. The patient is currently Medicaid pending. Once that goes through, he will be set up for a defibrillator. Currently, he is wearing an external defibrillator which will cover him at home until his Medicaid goes through. The patient's blood pressure is within the limits of  our treatment for congestive heart failure. He is on very small dose Coreg, lisinopril  and Lasix. He may also be a candidate for chronic anticoagulation, but we will leave this up to Dr. Humphrey Rolls as outpatient. Eventually, this gentleman will need to be on the cardiac transplant list. I did speak to him. His urine toxicology must be clean in order to be a  candidate for this. Overall prognosis is poor. Upon discharge, the patient walked around the nursing station with me. Pulse oximetry after ambulation was 97% on room air. He is not a candidate for home oxygen at this point.  2. Polysubstance abuse and tobacco abuse: I explained to the patient in order to be a candidate for a transplant he needs to have a clean urine toxicology. I advised stopping pain medications, stopping Xanax, stopping cannabis. The best thing he can do for his health is quit the smoking. Smoking cessation counseling was done three minutes by me. A nicotine patch was prescribed upon discharge.  3. Chronic obstructive pulmonary:  He was given a couple of doses of IV Solu-Medrol, but I do not believe that this is his underlying problem that has him short of breath. I believe that this is his heart with his low ejection fraction. He is on Ventolin and Advair and smoking cessation advised.  4. Gastroesophageal reflux disease and stomach pain: He was prescribed Levbid and omeprazole.  5. The patient did pick off ticks on his body while here in the hospital. I will give a prophylactic doses of doxycycline 100 mg x2 pills.   Close clinical follow-up is needed as an outpatient.  Follow up with Dr. Humphrey Rolls, Cardiology, and the [facility] [place]:  40 minutes.   ____________________________ Tana Conch. Leslye Peer, MD rjw:cbb D: 07/01/2011 15:16:30 ET T: 07/03/2011 12:42:43 ET JOB#: [id number]  cc: Tana Conch. Leslye Peer, MD, <Dictator> [facility] Dionisio David, MD Marisue Brooklyn MD ELECTRONICALLY SIGNED 07/08/2011 13:07

## 2014-05-17 NOTE — Discharge Summary (Signed)
PATIENT NAME:  [name], [name] MR#:  [id number] DATE OF BIRTH:  December 31, 1960  DATE OF ADMISSION:  05/17/2011 DATE OF DISCHARGE:  05/19/2011  ADMITTING DIAGNOSIS: Systemic inflammatory response reaction.  DISCHARGE DIAGNOSES:  1. Systemic inflammatory response reaction due to chronic obstructive pulmonary disease exacerbation, bacterial pneumonia.  2. Respiratory failure.  3. Chronic obstructive pulmonary disease exacerbation.  4. Bacterial pneumonia, unclear bacterial etiology.   5. Elevated troponin, likely demand ischemia per Cardiology, not acute coronary syndrome.  6. 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  7. History of alcohol abuse. 8. History of cocaine abuse.  9. History of tobacco abuse.   DISCHARGE CONDITION: Stable.   DISCHARGE MEDICATIONS:  1. The patient is to start Coreg 325 mg p.o. b.i.d.    2. Nitrostat 0.4 mg sublingual every 5 minutes as needed. 3. Aspirin 325 mg p.o. daily.  4. Nicotine patch 14 mg topically daily.  5. Zocor 40 mg p.o. at bedtime.  6. Symbicort 160/4.5, 2 puffs inhalation b.i.d.   7. Tiotropium 1 capsule inhalation once daily.  8. Nicotine oral inhaler, 1 cartridge every 1 hour as needed.  9. Lisinopril 2.5 mg p.o. daily.  10. Zithromax 250 mg p.o. daily for 4 more days.  11. Thiamine 100 mg p.o. daily.  12. Folic acid 1 mg p.o. daily.  13. Digoxin 125 mcg p.o. daily.  14. Prednisone 60 mg p.o. once tomorrow on 05/20/2011, then taper x10 mg daily until stop. 15. Albuterol 2 puffs every 4 hours as needed.  DIET: 2 grams salt, low fat, low cholesterol.   PHYSICAL ACTIVITY LIMITATIONS: As tolerated.   FOLLOWUP:  1. Follow-up appointment with Dr. Neoma Laming in two days after discharge.  2. Follow-up appointment with [facility] in two days after discharge.   CONSULTANTS:  1. Care Management. 2. Social Work.  3. Dr. Neoma Laming.   LABORATORY, DIAGNOSTIC AND RADIOLOGICAL DATA:  First  chest x-ray done on 05/17/2011, PA and lateral, showed left lower lobe pneumonia superimposed on probable chronic interstitial lung disease. Second chest x-ray, portable single view on 05/17/2011, showed findings consistent with congestive heart failure. Superimposed pneumonia, particularly right lung base, cannot be excluded. Interstitial prominence is less prominent from prior study of the same day, according to the radiologist. Repeated chest x-ray, PA and lateral, on 05/19/2011, showed small left pleural effusion slightly increased from prior, minimal left basilar opacities likely representing atelectasis. Continued follow-up was suggested.  Lab data on 05/17/2011 showed B-type natriuretic peptide of 2700. BMP was unremarkable.  The patient's liver enzymes showed albumin level of 3.1, elevated AST to 67.  Cardiac enzymes, first set, showed troponin elevation to 1.1 with CK-MB fraction of 4.3; second set troponin elevation to 1.0; third set troponin elevation to 1.2.  The patient's TSH was normal at 1.68. Urine drug screen was negative.  The patient's CBC showed white blood cell count of 6.1, hemoglobin 12.0, platelet count 219. Coagulation panel was unremarkable.  Blood cultures taken on 05/17/2011 did not show any growth.   HISTORY AND PHYSICAL: The patient is a 54 year old male with past medical history significant for history of tobacco abuse, smoking, who presented to hospital with complaints of shortness of breath. Please refer to Dr. Lianne Moris admission note on 05/17/2011.  On arrival to the hospital, the patient's temperature was 97.6, pulse was 118, respiration rate was 24, blood pressure 118/78. Oxygen saturation was 88% on room air. Physical exam showed bilateral rales as well as wheezing but no  crackles and no other significant abnormalities. The patient's EKG showed sinus tachycardia with premature atrial complexes, left ventricular hypertrophy at 111 beats per minute. Nonspecific ST-T  changes were noted. Chest x-ray revealed possible mild pulmonary edema as well as possible pneumonia.   HOSPITAL COURSE: The patient was admitted to the hospital because of his elevated troponin as well as some changes in his chest x-ray, and it was felt that the patient had non-Q-wave myocardial infarction as well as also congestive heart failure.  The patient was started on medications for non-Q-wave myocardial infarction, and consultation with Dr. Neoma Laming was obtained. Dr. Neoma Laming saw the patient in consultation the same day. He felt that the patient was to have an echocardiogram done. He also felt that the patient's symptoms could be due to pneumonia and recommended first to hold off and treat pneumonia then and make decision about cardiac catheterization. The patient was started on antibiotic therapy and was continued on cardiac medications. As mentioned above, the patient's echocardiogram was performed, and the patient was noted to have severe dilated left ventricle with severe left ventricular dysfunction, moderate MR as well as ASD and left-to-right shunt, however, normal pulmonary artery pressures. Ejection fraction was found to be equal or less than 25%. Dr. Juanda Bond followed the patient along while the patient was in the hospital and recommended therapy for him. He was also treated for pneumonia.   In regards to systemic inflammatory response reaction, first it was felt that it was related to chronic obstructive pulmonary disease exacerbation as well as bacterial pneumonia; however, the patient's sputum cultures were not obtained. He was treated with antibiotic therapy. With this therapy, he somewhat improved. He was also given inhalation therapy and oxygen. He initially required some oxygen; however, later on his oxygen was weaned off, and he felt progressively better. His elevated troponin, as mentioned above, was felt to be due to demand ischemia.   In regards to cardiomyopathy, it  was of unclear etiology, but alcoholic as well as cocaine abuse was indicated. The patient was to be started on Coreg as well as lisinopril. He is to follow-up with Dr. Neoma Laming in regards to cardiac catheterization in the future.   DISCHARGE VITAL SIGNS:  On the of discharge, temperature 97.5, pulse 60, respirate rate was 20, blood pressure 109/62. Saturation was 94 to 96% on room air.   TIME SPENT:   40 minutes. ____________________________ Theodoro Grist, MD rv:cbb D: 05/26/2011 20:42:20 ET T: 05/27/2011 11:17:24 ET JOB#: [id number] cc: Dionisio David, MD [facility] [place] MD ELECTRONICALLY SIGNED 06/03/2011 14:13

## 2014-05-17 NOTE — Consult Note (Signed)
Sevrer LV dysfunction LVEF 20 %, dilated LA/LV, with ASD and left to right shunt on echo. But current symptoms probably due to LLL pneumonia. Plan hold off on cath and treat pneumonia, and titrate coreg/ace inhibitors and digoxin and aldactone .  Electronic Signatures: Angelica Ran (MD)  (Signed on 24-Apr-13 11:48)  Authored  Last Updated: 24-Apr-13 11:48 by Angelica Ran (MD)

## 2014-05-17 NOTE — H&P (Signed)
PATIENT NAME:  [name], [name] MR#:  [id number] DATE OF BIRTH:  [birth year]  DATE OF ADMISSION:  05/17/2011  PRIMARY CARE PHYSICIAN: Non-local REFERRING PHYSICIAN: Dr. Prince Rome   CHIEF COMPLAINT: Shortness of breath for 2 to 3 weeks.   HISTORY OF PRESENT ILLNESS: The patient is a 54 year old Caucasian male with no obvious past medical history who presented to the ED with shortness of breath for 2 to 3 weeks. The patient is alert, awake, and oriented. He stated that he has had shortness of breath, cough with whitish-yellowish sputum for about 2 to 3 weeks. In addition, he had palpitations, weakness, and dyspnea on exertion, but denies any chest pain, sweating, nocturnal dyspnea, or orthopnea. No leg edema. The patient said he lost weight, about 5 pounds in the past two weeks. He was noted to have an elevated troponin of 1.1 in the Emergency Department. EKG shows sinus tachycardia with left ventricular hypertrophy. He was treated with aspirin 325 mg p.o. and Lovenox 75 mg subcutaneously.   PAST MEDICAL HISTORY:  None.  SOCIAL HISTORY: He has smoked one pack a day for 30 years. Denies any alcohol drinking but sometimes uses cocaine.  He snorted cocaine 2 to 3 days ago.   FAMILY HISTORY: Mother had chronic obstructive pulmonary disease. Father had a heart attack in his 39s.   PAST SURGICAL HISTORY: None.     ALLERGIES: None.  MEDICATIONS:  None.   REVIEW OF SYSTEMS:  CONSTITUTIONAL: The patient denies any fever or chills. No headache or dizziness, but has weakness and weight loss. EYES: No double vision or blurred vision. ENT: No epistaxis or postnasal drip.  No dysphagia.  RESPIRATORY: Positive for cough, sputum, shortness of breath, but no hemoptysis. CARDIOVASCULAR: No chest pain but has palpitations. No orthopnea, nocturnal dyspnea, or leg edema. GI: No abdominal pain, nausea, vomiting, or diarrhea. No melena or bloody stools. GU: No dysuria or hematuria or incontinence. ENDOCRINE: No polyuria or  polydipsia. No heat or cold intolerance. HEME: No easy bruising or bleeding. SKIN: No rash or jaundice. NEUROLOGY: No syncope, loss of consciousness, or seizure. PSYCH: No depression. No anxiety.   PHYSICAL EXAMINATION:  VITAL SIGNS: Temperature 97.6, blood pressure 118/78, pulse 118, respirations 24, oxygen saturation 88% on room air.   GENERAL: The patient is alert, awake, oriented, in no acute distress.   HEENT: Pupils round, equal, and reactive to light and accommodation. Moist oral mucosa. Clear oropharynx.   NECK: Supple. No JVD or carotid bruit. No lymphadenopathy. No thyromegaly.   CARDIOVASCULAR: S1, S2, tachycardic. No murmurs or gallops.   PULMONARY: Bilateral air entry. Bilateral basilar rales. No wheezing. No crackles.   ABDOMEN: Soft. No distention or tenderness. No organomegaly. Bowel sounds present.   EXTREMITIES: No edema, clubbing, or cyanosis. No calf tenderness. Strong bilateral pedal pulses.   SKIN: No rash or jaundice.   NEUROLOGIC: Alert and oriented times three. No focal deficits. Deep tendon reflexes 2+. Power 5/5. Sensation intact.   LABS/STUDIES: Troponin 1.1. WBC 6.1, hemoglobin 12.2, platelets 219. Electrolytes are normal. BUN 12, creatinine 1.05, albumin 3.1. BNP 2700. CK 215, CK-MB 4.3, INR 1. PT 13.4.   EKG: Sinus tachycardia with premature atrial contractions and left ventricular hypertrophy at 111 beats per minute.   Chest x-ray shows possible mild pulmonary edema.   IMPRESSION:  1. Non-STEMI.  2. Acute new onset congestive heart failure.  3. Sinus tachycardia.  4. Cocaine abuse.  5. Smoking.  6. Anemia.   PLAN OF TREATMENT:  1. The patient  will be admitted to the telemetry floor. We will continue telemetry monitor, O2 by nasal cannula. We will continue aspirin 325 mg p.o. daily and give Plavix 300 mg, one dose now and 75 mg p.o. daily. In addition we will give Lovenox 1 mg per kg subcutaneous q.12 hours and give Zocor.  2. We will start Lasix  20 mg IV q.12 hours and start low-dose Coreg and lisinopril.  3. We will check echocardiogram and get a cardiology consult.  4. We will check lipid panel, TSH, and hemoglobin A1c.  5. GI prophylaxis.  6. Discussed the patient's situation and plan of treatment with the patient and the patient's mother.   TIME SPENT: About 60 minutes.   ____________________________ Demetrios Loll, MD qc:bjt D: 05/17/2011 05:10:08 ET T: 05/17/2011 07:24:22 ET JOB#: [id number]  cc: Demetrios Loll, MD, <Dictator> Demetrios Loll MD ELECTRONICALLY SIGNED 05/20/2011 16:06

## 2014-05-17 NOTE — Consult Note (Signed)
PATIENT NAME:  [name], [name] MR#:  [id number] DATE OF BIRTH:  Apr 22, 1960  DATE OF CONSULTATION:  06/28/2011  REFERRING PHYSICIAN:  Alounthith Phichith, MD  CONSULTING PHYSICIAN:  A. Lavone Orn, MD  CHIEF COMPLAINT: Abnormal thyroid function tests.   HISTORY OF PRESENT ILLNESS: This is a 54 year old male seen in consultation for a history of abnormal thyroid function tests. He was admitted early this morning with complaints of shortness of breath and chest pressure. He had been hospitalized in April of this year with congestive heart failure (EF 25%) in the setting of COPD, pneumonia, and polysubstance abuse. During that hospitalization he was found to have an initial normal TSH on 05/17/2011 of 1.68 uIU/mL (reference range 0.45 to 4.5). A repeat TSH on 05/18/2011 was low at 0.250 uIU/mL with a normal free T4 of 1.25 ng/dL. He has no prior history of thyroid disease and has no family history of thyroid disease. His records from his prior hospitalization were reviewed. It does not appear he was given any lithium or amiodarone although he was treated with prednisone for the COPD exacerbation. I do not believe he was given any iodinated contrast dye during that prior hospitalization. Since his hospital discharge on 05/19/2011, he had been feeling fairly well until a few days prior to this admission. He had been following with Dr. Humphrey Rolls, Cardiology, but had not yet established care with a primary care provider. He does not believe he has had any thyroid function tests in the interim. He denies any heat intolerance. He denies a tremor. He has had episodic heart racing. He denies any neck pain. No recent fevers. No weight loss. In fact, he believes he has put on about 10 pounds since his last hospital discharge. Appetite has been good. After this consult was called, he had a repeat thyroid stimulating hormone level which measured within normal limits at 1.32 uIU/mL. He did receive a CT with contrast dye after  this lab was drawn, which was a chest CT, and that was negative for PE.   PAST MEDICAL HISTORY:  1. Hospitalization in April 2013 for congestive heart failure, COPD exacerbation.  2. Polysubstance abuse, history of cocaine and alcohol use.  3. Tobacco dependence.  4. Depression and anxiety disorder.   PAST SURGICAL HISTORY: None.   ALLERGIES: None.   CURRENT INPATIENT MEDICATIONS:  1. Digoxin 0.125 mg daily.  2. Nicotine patch 14 mg daily.  3. Aspirin 325 mg daily.  4. Lovenox 40 mg sub-Q daily.  5. Symbicort 160/4.5 2 puffs b.i.d.  6. Spiriva 1 capsule daily.   SOCIAL HISTORY: The patient is single. He lives with his mother. He smokes about one-half pack per day of cigarettes. Occasional alcohol use. Positive history of drug use including cocaine, alcohol, and marijuana.   FAMILY HISTORY: No known thyroid disease.   REVIEW OF SYSTEMS: GENERAL: Weight gain as per history of present illness. No fevers. HEENT: No blurred vision. No sore throat. NECK: No neck pain. No dysphasia. CARDIAC: Occasional racing heart rate. No chest pain. PULMONARY: Chronic dyspnea on exertion. No cough at present. ABDOMEN: Some abdominal discomfort. Appetite fair. He has had constipation. No nausea/vomiting. EXTREMITIES: Denies leg swelling. SKIN: Denies recent skin changes or pruritus. NEUROLOGIC: No recent falls. PSYCHIATRIC: He has what he describes as "panic episodes" which occur 6 to 7 times per day and are followed by a sense of urgency to urinate. These last seconds. No triggers.   PHYSICAL EXAMINATION:   VITAL SIGNS: Temperature 98.6, pulse 114, respirations  20, blood pressure 84 to 109/60 to 68, pulse oximetry 94% on 3 liters by nasal cannula.   GENERAL: Thin white male in no acute distress.   HEENT: Extraocular movements are intact. Oropharynx is clear. Mucous membranes moist.   NECK: Supple. No neck tenderness. No thyroid bruit.   CARDIAC: Tachycardia. Regular rhythm. No carotid bruit.    PULMONARY: Clear bilaterally. No wheeze or rhonchi.   ABDOMEN: Diffusely soft. Mild tenderness without guarding. Tenderness is located diffusely. Positive bowel sounds.   EXTREMITIES: No leg swelling.   SKIN: No rash or dermatopathy.   NEUROLOGIC: No tremor is present.   PSYCHIATRIC: Calm, cooperative, alert and oriented x3.   LABORATORY, DIAGNOSTIC, AND RADIOLOGICAL DATA: Thyroid labs as per history of present illness.    ASSESSMENT: This is a 54 year old male with a history of COPD and CHF previously with a low TSH in the setting of acute illness in April which has since normalized. He is euthyroid at this time. I suspect his low TSH in the past was either due to medication (such as steroids) or potentially sick euthyroid.   RECOMMENDATIONS: No further thyroid labs are needed at this time. The patient was reassured.   Thank you for the kind request for consultation.   ____________________________ A. Lavone Orn, MD ams:drc D: 06/28/2011 13:05:44 ET T: 06/28/2011 13:23:14 ET JOB#: [id number]  cc: A. Lavone Orn, MD, <Dictator> Sherlon Handing MD ELECTRONICALLY SIGNED 07/01/2011 10:12

## 2014-05-17 NOTE — Consult Note (Signed)
PATIENT NAME:  [name] [name] MR#:  [id number] DATE OF BIRTH:  12/14/1960  DATE OF CONSULTATION:  05/17/2011  REFERRING PHYSICIAN:  Demetrios Loll, MD  CONSULTING PHYSICIAN:  Merla Riches, PA-C/Shaukat Humphrey Rolls, MD  REASON FOR CONSULTATION: Chest pain with possible non ST-elevation myocardial infarction, congestive heart failure.   HISTORY OF PRESENT ILLNESS: [name] [name] is a 54 year old white male who does not currently have a primary medical doctor. He has an unremarkable past medical and surgical history. He notes that over the last three weeks he has had increased shortness of breath which has gradually worsened in intensity. He notes that last evening he had a chest tightness that he describes as a squeezing-like pain when he was lying in bed. Pain was associated shortness of breath and anxiety, but he did not have any diaphoresis or radiation of the pain into his left arm or jaw. Chest pain lasted a couple of minutes, and he was brought to the Emergency Department. He has had increased productive cough with phlegm, wheezing, and 1 to 2 pillow orthopnea or has sometimes even slept in a recliner chair. He denies any edema or claudication symptoms. He does have a history of intermittent cocaine use but does not have any previous history of any heart problems.   PAST MEDICAL HISTORY:  1. No known medical problems, per the patient report.  2. Tobacco abuse.  3. History of recent drug use.   PAST SURGICAL HISTORY: Negative.   ALLERGIES: None.   HOME MEDICATIONS: None.   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 He smokes 1 pack per day and has done so for the last 30 years. He occasionally drinks beer (3 or 4 beers once per week). He occasionally uses cocaine and last snorted cocaine about 2 or 3 days ago.   FAMILY HISTORY: Mother with chronic obstructive pulmonary disease. Father with myocardial infarction in his 63s. Maternal grandfather died of  prostate cancer.   REVIEW OF SYSTEMS: Shortness of breath, coughing, wheezing, anxiety, chest pain as mentioned in the history of present illness.   PHYSICAL EXAMINATION:  GENERAL: This is a pleasant male who is not in any acute distress. He is alert and oriented x3.   VITAL SIGNS: Temperature 98 degrees Fahrenheit, heart rate is 112, respiratory rate is 22, blood pressure 113/65. Oxygen saturation is 95% on 2 liters per minute on nasal cannula.   HEENT: Head atraumatic, normocephalic. Eyes: Pupils are round, equal bilaterally, reactive to light. There is no scleral icterus. Conjunctivae are pink. Ears and nose are normal on inspection. Mouth: Moist mucous membranes.   NECK: Supple. Trachea is midline. Thyroid is smooth and mobile. No JVD appreciated. No carotid bruits.   LUNGS: The patient has bilateral basilar rales. No wheezing appreciated.   CARDIOVASCULAR: Tachycardic rate. No murmurs, rubs, or gallops appreciated.   ABDOMEN: Nondistended. Bowel sounds are present. Abdomen is soft and nontender to palpation with no rebound tenderness, guarding, peritoneal signs, or hepatosplenomegaly.   EXTREMITIES: No cyanosis, clubbing, or edema.   LABORATORY, DIAGNOSTIC AND RADIOLOGICAL DATA:  Labs: Glucose 89, BNP 2700, BUN 12, creatinine 1.05, sodium 139, potassium 4.0, chloride 107, CO2 23, estimated GFR is greater than 70 calcium, calcium is 8.3. Magnesium is 1.6.  Total protein is 7.8, albumin is 3.1, alkaline phosphatase 54, AST 67, ALT 58.  Total CK is 215, CK-MB is 4.3. Troponin I is 1.10. TSH is 1.68. Urine drug screen is negative.  Hemoglobin 12.0, hematocrit 35.9,  white count 6.1, platelet count 219,000. PT 13.4 INR is 1.0. EKG on admission was sinus tachycardia, nonspecific ST changes with left ventricular hypertrophy with possible strain, revealed telemetry with sinus tachycardia at about 100 beats per minute.  Chest x-ray: Left lower lobe pneumonia superimposed on probable chronic  interstitial lung disease.   ASSESSMENT/PLAN:  1. Chest pain: The patient had an episode of squeezing type chest pain and has some mild elevations in his troponin I. Symptoms could be from possible non-ST elevation myocardial infarction or from demand ischemia from respiratory distress as the patient's chest x-ray is consistent with a left lower lobe pneumonia. I will continue to cycle cardiac enzymes. The patient has already received Plavix, carvedilol, and nitroglycerin.  2. Possible congestive heart failure: The patient had elevated BNP.  We need to further evaluate with echocardiogram.  3. Tobacco abuse with possible chronic obstructive pulmonary disease: The patient has smoked 1 pack per day for over 30 years, and he has chronic lung changes that are superimposed on his likely left lower lobe pneumonia.    We will await the patient's echocardiogram results, and depending on the results of his cardiac enzymes will determine whether a cardiac catheterization is needed. He has already received Lovenox this morning, and that will be held.   Thank you very much for this consultation and allowing [place] to anticipate in this patient's care. The patient's case was discussed with Dr. Neoma Laming, who agrees with the assessment and management as named above.   ____________________________ Merla Riches, PA-C mam:cbb D: 05/17/2011 12:37:12 ET T: 05/17/2011 12:53:45 ET JOB#: [id number]  cc: Merla Riches, PA-C, <Dictator> Manraj Yeo A Alnisa Hasley PA ELECTRONICALLY SIGNED 05/22/2011 11:06

## 2014-05-17 NOTE — Consult Note (Signed)
Brief Consult Note: Diagnosis: Chest pressure in patient with cardiomyopathy, COPD.   Recommend further assessment or treatment.   Orders entered.   Comments: Patient c/o increased SOB, chest pressure/tightness and had TNI of 0.75 and 0.76. He had recent admission 4/24/-05/19/11 with non Q-wave MI with elevated TNI thought ot be demand ischemia, but did not have cardiac cath at that time. Patient CP free at this time, but will proceed with cardiac catherization tomorrow to r/o CAD(pt ate breakfast). Due to hypotension, beta-blocker and ACE-I are on hold.  Electronic Signatures: Angelica Ran (MD)   (Signed 06-Jun-13 08:17)  Co-Signer: Brief Consult Note Letzy Gullickson A (PA-C)   (Signed 05-Jun-13 10:18)  Authored: Brief Consult Note  Last Updated: 06-Jun-13 08:17 by Angelica Ran (MD)

## 2014-05-17 NOTE — H&P (Signed)
PATIENT NAME:  [name], [name] MR#:  [id number] DATE OF BIRTH:  07/04/1960  DATE OF ADMISSION:  06/28/2011  REFERRING PHYSICIAN: Dr. Ulice Brilliant  PRIMARY CARE PHYSICIAN:  [facility]   PRESENTING COMPLAINT: Shortness of breath, chest pressure.   HISTORY OF PRESENT ILLNESS: [name] is a 54 year old gentleman with recent evaluation in April 2013 and diagnosed with chronic obstructive pulmonary disease, pneumonia, cardiomyopathy with ejection fraction of 25%, history of polysubstance abuse with alcohol, cocaine, and tobacco, history of depression and anxiety who represents with reports of worsening shortness of breath for the past two days. He also reports chest pressure across his chest as if he is having cramping and diaphoresis as well as nausea and dry heaving. He endorses worsening anxiety and panic due to his symptoms. He also reports abdominal pain that began two days ago as well. He has not been weighing himself daily. He endorses dry cough for the past 3 to 4 days. No worsening lower extremity swelling.  He denies any palpitations, presyncope, or syncope. The patient continues to wear an external defibrillator and reports that he has been compliant with his medications. On initial arrival he was found to be tachycardiac at 118, and blood pressure 90s over 60s, with drop in his pressures during evaluation to 70s over 50s.   PAST MEDICAL HISTORY:  1. Admitted from 04/24 to 05/19/2011 for management of SIRS/pneumonia/chronic obstructive pulmonary disease exacerbation. He was found to have a non-Q-wave myocardial infarction with elevated troponin, thought to be demand ischemia as well as cardiomyopathy thought to be either alcohol or cocaine-related or both. Prior to that admission the patient had no medical diagnoses.  2. Chronic obstructive pulmonary disease.  3. Polysubstance abuse, quit heavy alcohol use about a year ago. Last used cocaine in March 2013. Cut down on his tobacco use now, has 1  pack every two days.  4. 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  5. Depression/anxiety.   PAST SURGICAL HISTORY: None.   ALLERGIES: No known drug allergies.   MEDICATIONS FROM LAST DISCHARGE:   1. Coreg 3.25 mg b.i.d.  2. Nitrostat sublingual 0.4 mg p.r.n.  3. Aspirin 325 mg daily.  4. Nicotine patch 14 mg daily.  5. Zocor 40 mg at bedtime.  6. Symbicort 160/4.5, 2 puffs b.i.d.  7. Spiriva daily.  8. Lisinopril 2.5 mg daily.  9. Thiamine 100 mg daily.  10. Folic acid 1 mg daily.  11. Digoxin 125 mcg daily.  12. Albuterol 2 puffs every four hours as needed.   FAMILY HISTORY: Mother with chronic obstructive pulmonary disease. Father had myocardial infarction in his 66s.   SOCIAL HISTORY: He lives in [place] with his mom. He smokes a pack every two days, previously was on a pack per day. Reports that he drinks half a beer on occasion now but did drink heavily over a year ago. He reports his last cocaine use was in March 2013.   REVIEW OF SYSTEMS: CONSTITUTIONAL: Denies any fever. He endorses nausea. EYES: No glaucoma or cataracts. ENT: No epistaxis or discharge. RESPIRATORY: He reports dry cough for the past 2 to 3 days. No wheezing. No hemoptysis. CARDIOVASCULAR: As per history of present illness. GI: Endorses nausea and dry heaving. No hematemesis or melena. He reports abdominal pain and cramping which has now improved. GU: No dysuria or hematuria. ENDO: No  polyuria or polydipsia. HEME: No easy bleeding. SKIN: No ulcers. MUSCULOSKELETAL: No joint swelling. NEUROLOGIC: No history of strokes or seizures.  PSYCH: He endorses anxiety. No suicidal ideation.   PHYSICAL EXAMINATION:  VITAL SIGNS: Temperature 97.4, pulse 118, his pulse on last admission was also in the 110's. Respiratory rate 20, blood pressure  92/64 with drop in his pressures to 70s over 50s, sating currently 93% on 2 liters.   GENERAL: Lying in bed in discomfort.   HEENT: Normocephalic, atraumatic. Pupils are equal and symmetric. Nasal cannula in place. Moist mucous membranes.   NECK: Soft and supple. No adenopathy or JVP.   CARDIOVASCULAR: Tachycardic. He has a soft systolic murmur. No rubs or gallops.   LUNGS: Basilar crackles. No use of accessory muscles or increased respiratory effort.   ABDOMEN: Soft. Positive bowel sounds. No mass appreciated. Does not seem to be tender on exam.   EXTREMITIES: No edema. Dorsal pedis pulses intact.   MUSCULOSKELETAL: No joint effusion.   SKIN: No ulcers.   NEUROLOGIC: No dysarthria or aphasia. Symmetrical strength. No asterixis. No focal deficits.   PSYCH: He is alert and oriented. The patient is cooperative.   PERTINENT LABS AND STUDIES: WBC 6.8, hemoglobin 13.4, hematocrit 39.7, platelets 189, MCV 96, glucose 99, BUN 11, creatinine 0.91, sodium 139, potassium 4.4, chloride 108, carbon dioxide 23, calcium 8.8. Troponin 0.75. CK 55, MB 1.5, BNP is 5359. Digoxin level of 0.25. EKG with sinus tachy of 114. There is a P-wave inversion. No ST elevation or depression. There is left ventricular hypertrophy noted on EKG. CT with PE protocol with no evidence of PE. There is mild intralobular septal thickening and mild interstitial infiltrates that may be due to pulmonary edema. There are mild emphysematous changes. There is consolidative infiltrate or atelectasis  posterior costophrenic angles. There are small bilateral pleural effusions.   ASSESSMENT AND PLAN: [name] is a 54 year old gentleman with history of chronic obstructive pulmonary disease, polysubstance abuse, depression, anxiety, and cardiomyopathy with ejection fraction of 25%, moderate MR, ASD, recently admitted for management of chronic obstructive pulmonary disease exacerbation, SIRS, pneumonia, non-Q-wave myocardial infarction,  and congestive heart failure representing with complaints of chest pain and shortness of breath.  1. Shortness of breath/chest pain: Likely multifactorial but more likely in the setting of systolic dysfunction, congestive heart failure plus/minus chronic obstructive pulmonary disease. CT scan was negative for PE. The patient has persistent troponin elevation likely in the setting of cardiomyopathy and demand ischemia. His CK and MBs are within normal limits. BNP is elevated compared to his last admission. Chest x-ray seems unchanged but a CT of the chest does support some elements of congestion. We will continue to cycle cardiac enzymes and continue on telemetry. Will restart his aspirin, nitroglycerin sublingual as needed, oxygen, Zocor, and digoxin. Given his relative hypotension we will actually hold his lisinopril and Coreg and hold off also on diuresis. The patient has an external defibrillator. We will obtain cardiology consultation to evaluate for catheterization, especially concerning for possible angina with his presentation. We will resume his Symbicort and Spiriva SVNs as needed. Continue ins and outs and daily weights.  2. Polysubstance abuse: We will send a urine drug screen, start on nicotine patch. He denies any alcohol abuse for greater than a year now. We will get alcohol level. We will stop his thiamine and folic acid for now.  3. Depressed TSH and free T3 levels on last admission. We will repeat and get endocrine consultation.  4. Prophylaxis with  aspirin, Lovenox, and Protonix.   TIME SPENT: Approximately 50 minutes were spent on patient care.    ____________________________ Rita Ohara, MD ap:bjt D: 06/28/2011 01:28:59 ET T: 06/28/2011 07:49:28 ET JOB#: [id number]  cc: Brien Few Kayan Blissett, MD, <Dictator> [facility] MD ELECTRONICALLY SIGNED 07/04/2011 3:24

## 2014-05-17 NOTE — Consult Note (Signed)
Chief Complaint and History:   Referring Physician Dr. Inez Catalina    Chief Complaint abnormal thyroid function tests   Allergies:  No Known Allergies:   Assessment/Plan:   Assessment/Plan Patient was seen, examined and chart was reviewed. This is a 54 y/o M admitted with SOB and tachycardia who was hospitalized in 04/2011 with new onset CHF in the setting of pna and polysubstance abuse. During his last hospitalization, initial TSH on 4/24 was normal but a repeat on 4/25 was low at 0.250 and free T4 was normal. He had a repeat TSH earlier today which was again normal.  A  / P History of abnormal thyroid function tests, resolved. He may have had sick euthyroid. No treatment or further thyroid labs are needed.  A full consult will be dictated.   Electronic Signatures: Judi Cong (MD)  (Signed 05-Jun-13 12:56)  Authored: Chief Complaint and History, ALLERGIES, Assessment/Plan   Last Updated: 05-Jun-13 12:56 by Judi Cong (MD)

## 2014-05-24 NOTE — Discharge Summary (Signed)
PATIENT NAME:  [name], [name] MR#:  [id number] DATE OF BIRTH:  1960-06-24  DATE OF ADMISSION:  01/14/2014 DATE OF DISCHARGE:  01/19/2014  DISCHARGE DIAGNOSES: 1.  Bronchitis. 2.  Chronic obstructive pulmonary disease exacerbation.  3.  Myeloma with back pain, better controlled   HISTORY OF PRESENT ILLNESS: A 54 year old gentleman with known history of multiple myeloma on treatment presented on December 23rd with complaints of fever, chills and abnormal chest findings on exam by Dr. Lavone Neri. Chest x-ray was negative for obvious infiltrate, but clinically the patient was felt to have possible pneumonia and admitted to hospital. He also had remarkable back pain and was also admitted for better pain control. The patient was placed on oxygen and broad-spectrum antibiotic coverage. He was placed on Dilaudid PCA for breakthrough pain control. Skeletal survey done was suggestive of a new T12 compression abnormality.   For past medical history, surgical history, home medications, family history, review of systems, and exam findings, please refer to the history and physical note for details.   HOSPITAL COURSE: The patient was admitted to the oncology floor and was started on hydromorphone PCA, broad-spectrum antibiotic coverage with IV Zosyn. Blood cultures remained negative during hospitalization. Dose of fentanyl patch was increased as tolerated by the patient and was up to 125 mcg on December 25th. The patient remained afebrile, cultures remained negative and he was switched over from IV antibiotic to Augmentin p.o. on December 25th. Pain continued to come under slowly control. He also continued to require nasal cannula oxygen and was evaluated and qualified for home oxygen therapy. Otherwise, he did not have any progressive cough, pleuritic-type chest pain or sputum or hemoptysis. He was eating better by December 28th, remained afebrile. He was ambulating well without any new leg weakness, fecal and urinary  retention and back pain was under good control by December 28th also. The patient deferred  MRI of the spine since his back pain was much better controlled. Repeat chest x-ray on December 25th also reported only emphysematous changes without acute cardiopulmonary disease, old fractures compatible with history of myeloma. The patient was discharged home on December 28th at higher dose of fentanyl 125 mcg per hour transdermal q. 48 hours, Augmentin 875 mg b.i.d. x5 more days as outpatient. Home oxygen was arranged.   DISCHARGE MEDICATIONS: Ventolin inhaler 2 puffs q.i.d., Advair Diskus 100/50 inhaler b.i.d., promethazine 25 mg q. 6 hours p.r.n., Revlimid 25 mg once a day with 3 weeks on and 1 week off cycles, MiraLax 17 grams once daily, Decadron 40 mg once a week for myeloma therapy, Zofran 4 mg q. 4 to 6 hours p.r.n. for nausea, amiodarone 200 mg once daily, aspirin 325 mg once daily, Lasix 20 mg once daily as needed, potassium chloride 10 mEq to take concurrently with Lasix, Colace 100 mg 2 capsules daily, fentanyl 125 mcg/h transdermal q. 48 hours, Xanax 0.5 mg 1 to 2 tablets t.i.d. p.r.n. for anxiety, Dilaudid 2 mg 1 to 2 tablets q. 2 hours p.r.n. for pain, Augmentin 875 mg p.o. b.i.d. x5 days, folic acid 1 mg p.o. daily.   HOME OXYGEN: 3 liters per minute.   DISCHARGE DIET: Regular.   DISCHARGE ACTIVITY: As tolerated.  DISCHARGE FOLLOWUP: He was advised to keep previously scheduled appointment at [facility] on January 5th the same.   In between visits, the patient advised to call or come to ER in case of any recurrent pain, fevers or other acute symptoms. He was agreeable to this plan.  ____________________________ Rhett Bannister Ma Hillock, MD srp:sb D: 01/27/2014 08:42:53 ET T: 01/27/2014 11:07:17 ET JOB#: [id number]  cc: Noha Milberger R. Ma Hillock, MD, <Dictator> Alveta Heimlich MD ELECTRONICALLY SIGNED 01/27/2014 15:33

## 2014-05-25 ENCOUNTER — Other Ambulatory Visit: Payer: Self-pay | Admitting: *Deleted

## 2014-05-25 DIAGNOSIS — C9000 Multiple myeloma not having achieved remission: Secondary | ICD-10-CM

## 2014-05-26 ENCOUNTER — Other Ambulatory Visit: Payer: Self-pay | Admitting: *Deleted

## 2014-05-26 ENCOUNTER — Inpatient Hospital Stay: Payer: Medicaid Other | Attending: Internal Medicine

## 2014-05-26 ENCOUNTER — Inpatient Hospital Stay: Payer: Medicaid Other

## 2014-05-26 ENCOUNTER — Other Ambulatory Visit: Payer: Self-pay | Admitting: Internal Medicine

## 2014-05-26 DIAGNOSIS — C9000 Multiple myeloma not having achieved remission: Secondary | ICD-10-CM

## 2014-05-26 LAB — CBC WITH DIFFERENTIAL/PLATELET
BASOS ABS: 0 10*3/uL (ref 0–0.1)
Eosinophils Absolute: 0.3 10*3/uL (ref 0–0.7)
Eosinophils Relative: 5 %
HEMATOCRIT: 32.6 % — AB (ref 40.0–52.0)
Hemoglobin: 10.9 g/dL — ABNORMAL LOW (ref 13.0–18.0)
Lymphocytes Relative: 11 %
Lymphs Abs: 0.7 10*3/uL — ABNORMAL LOW (ref 1.0–3.6)
MCH: 31.3 pg (ref 26.0–34.0)
MCHC: 33.4 g/dL (ref 32.0–36.0)
MCV: 93.9 fL (ref 80.0–100.0)
MONO ABS: 0.8 10*3/uL (ref 0.2–1.0)
Monocytes Relative: 12 %
NEUTROS ABS: 4.5 10*3/uL (ref 1.4–6.5)
Platelets: 170 10*3/uL (ref 150–440)
RBC: 3.47 MIL/uL — ABNORMAL LOW (ref 4.40–5.90)
RDW: 16.8 % — AB (ref 11.5–14.5)
WBC: 6.3 10*3/uL (ref 3.8–10.6)

## 2014-06-01 ENCOUNTER — Other Ambulatory Visit: Payer: Self-pay | Admitting: *Deleted

## 2014-06-01 NOTE — Telephone Encounter (Signed)
Prescription ready for pick up. Patient informed

## 2014-06-01 NOTE — Telephone Encounter (Signed)
Called in refill Rx of alprazolam 0.5mg  tablets: 1-2 tabs by mouth TID PRN #60 x 1 refill to [facility] in [place].

## 2014-06-01 NOTE — Telephone Encounter (Signed)
Needs refill on Xanax and Dilaudid

## 2014-06-09 ENCOUNTER — Encounter (INDEPENDENT_AMBULATORY_CARE_PROVIDER_SITE_OTHER): Payer: Self-pay

## 2014-06-09 ENCOUNTER — Inpatient Hospital Stay: Payer: Medicaid Other

## 2014-06-09 DIAGNOSIS — C9000 Multiple myeloma not having achieved remission: Secondary | ICD-10-CM

## 2014-06-09 LAB — CBC WITH DIFFERENTIAL/PLATELET
Basophils Absolute: 0.1 10*3/uL (ref 0–0.1)
Basophils Relative: 1 %
EOS ABS: 0 10*3/uL (ref 0–0.7)
Eosinophils Relative: 1 %
HEMATOCRIT: 34 % — AB (ref 40.0–52.0)
HEMOGLOBIN: 11.3 g/dL — AB (ref 13.0–18.0)
LYMPHS ABS: 1.5 10*3/uL (ref 1.0–3.6)
Lymphocytes Relative: 25 %
MCH: 31.2 pg (ref 26.0–34.0)
MCHC: 33.1 g/dL (ref 32.0–36.0)
MCV: 94.3 fL (ref 80.0–100.0)
Monocytes Absolute: 0.9 10*3/uL (ref 0.2–1.0)
Monocytes Relative: 14 %
NEUTROS ABS: 3.5 10*3/uL (ref 1.4–6.5)
NEUTROS PCT: 59 %
Platelets: 278 10*3/uL (ref 150–440)
RBC: 3.6 MIL/uL — AB (ref 4.40–5.90)
RDW: 17.6 % — ABNORMAL HIGH (ref 11.5–14.5)
WBC: 6 10*3/uL (ref 3.8–10.6)

## 2014-06-15 ENCOUNTER — Telehealth: Payer: Self-pay | Admitting: *Deleted

## 2014-06-15 DIAGNOSIS — C9000 Multiple myeloma not having achieved remission: Secondary | ICD-10-CM

## 2014-06-15 MED ORDER — ALPRAZOLAM 0.5 MG PO TABS: ORAL_TABLET | ORAL | Status: DC

## 2014-06-15 MED ORDER — HYDROMORPHONE HCL 2 MG PO TABS: ORAL_TABLET | ORAL | Status: DC

## 2014-06-15 MED ORDER — FENTANYL 25 MCG/HR TD PT72: 25.0000 ug | MEDICATED_PATCH | TRANSDERMAL | Status: DC

## 2014-06-15 MED ORDER — FENTANYL 100 MCG/HR TD PT72: 100.0000 ug | MEDICATED_PATCH | TRANSDERMAL | Status: DC

## 2014-06-15 NOTE — Telephone Encounter (Signed)
Informed that prescription is ready to pick up  

## 2014-06-23 ENCOUNTER — Other Ambulatory Visit: Payer: Self-pay

## 2014-06-23 ENCOUNTER — Ambulatory Visit: Payer: Self-pay

## 2014-06-25 ENCOUNTER — Inpatient Hospital Stay: Payer: Medicaid Other

## 2014-06-25 ENCOUNTER — Other Ambulatory Visit: Payer: Self-pay | Admitting: *Deleted

## 2014-06-25 ENCOUNTER — Inpatient Hospital Stay: Payer: Medicaid Other | Attending: Internal Medicine

## 2014-06-25 VITALS — BP 89/62 | HR 71 | Temp 97.7°F | Resp 18

## 2014-06-25 DIAGNOSIS — C9000 Multiple myeloma not having achieved remission: Secondary | ICD-10-CM

## 2014-06-25 DIAGNOSIS — Z79899 Other long term (current) drug therapy: Secondary | ICD-10-CM | POA: Insufficient documentation

## 2014-06-25 LAB — BASIC METABOLIC PANEL
Anion gap: 6 (ref 5–15)
BUN: 11 mg/dL (ref 6–20)
CALCIUM: 8.2 mg/dL — AB (ref 8.9–10.3)
CO2: 26 mmol/L (ref 22–32)
Chloride: 101 mmol/L (ref 101–111)
Creatinine, Ser: 0.84 mg/dL (ref 0.61–1.24)
GFR calc non Af Amer: >60 mL/min (ref >60)
Glucose, Bld: 86 mg/dL (ref 65–99)
POTASSIUM: 3.9 mmol/L (ref 3.5–5.1)
Sodium: 133 mmol/L — ABNORMAL LOW (ref 135–145)

## 2014-06-25 LAB — CBC WITH DIFFERENTIAL/PLATELET
Basophils Absolute: 0 10*3/uL (ref 0–0.1)
Basophils Relative: 1 %
EOS ABS: 0.3 10*3/uL (ref 0–0.7)
Eosinophils Relative: 5 %
HCT: 31 % — ABNORMAL LOW (ref 40.0–52.0)
HEMOGLOBIN: 10.4 g/dL — AB (ref 13.0–18.0)
Lymphocytes Relative: 17 %
Lymphs Abs: 0.9 10*3/uL — ABNORMAL LOW (ref 1.0–3.6)
MCH: 32 pg (ref 26.0–34.0)
MCHC: 33.4 g/dL (ref 32.0–36.0)
MCV: 96 fL (ref 80.0–100.0)
MONO ABS: 0.6 10*3/uL (ref 0.2–1.0)
MONOS PCT: 11 %
NEUTROS ABS: 3.7 10*3/uL (ref 1.4–6.5)
NEUTROS PCT: 66 %
PLATELETS: 205 10*3/uL (ref 150–440)
RBC: 3.23 MIL/uL — ABNORMAL LOW (ref 4.40–5.90)
RDW: 17.4 % — ABNORMAL HIGH (ref 11.5–14.5)
WBC: 5.5 10*3/uL (ref 3.8–10.6)

## 2014-06-25 LAB — HEPATIC FUNCTION PANEL
ALBUMIN: 3.6 g/dL (ref 3.5–5.0)
ALT: 11 U/L — ABNORMAL LOW (ref 17–63)
AST: 14 U/L — AB (ref 15–41)
Alkaline Phosphatase: 26 U/L — ABNORMAL LOW (ref 38–126)
BILIRUBIN TOTAL: 0.6 mg/dL (ref 0.3–1.2)
Bilirubin, Direct: 0.1 mg/dL (ref 0.1–0.5)
Indirect Bilirubin: 0.5 mg/dL (ref 0.3–0.9)
Total Protein: 6.3 g/dL — ABNORMAL LOW (ref 6.5–8.1)

## 2014-06-25 MED ORDER — ZOLEDRONIC ACID 4 MG/5ML IV CONC
3.5000 mg | Freq: Once | INTRAVENOUS | Status: AC
  Administered 2014-06-25: 3.5 mg via INTRAVENOUS
  Filled 2014-06-25: qty 4.38

## 2014-06-25 MED ORDER — SODIUM CHLORIDE 0.9 % IV SOLN
Freq: Once | INTRAVENOUS | Status: AC
  Administered 2014-06-25: 13:00:00 via INTRAVENOUS
  Filled 2014-06-25: qty 1000

## 2014-07-07 ENCOUNTER — Ambulatory Visit: Payer: Medicaid Other

## 2014-07-07 ENCOUNTER — Inpatient Hospital Stay: Payer: Medicaid Other

## 2014-07-07 DIAGNOSIS — C9000 Multiple myeloma not having achieved remission: Secondary | ICD-10-CM

## 2014-07-07 LAB — CBC WITH DIFFERENTIAL/PLATELET
Basophils Absolute: 0 10*3/uL (ref 0–0.1)
Basophils Relative: 0 %
Eosinophils Absolute: 0.1 10*3/uL (ref 0–0.7)
Eosinophils Relative: 2 %
HCT: 31 % — ABNORMAL LOW (ref 40.0–52.0)
Hemoglobin: 10.4 g/dL — ABNORMAL LOW (ref 13.0–18.0)
Lymphocytes Relative: 21 %
Lymphs Abs: 1.2 10*3/uL (ref 1.0–3.6)
MCH: 32 pg (ref 26.0–34.0)
MCHC: 33.4 g/dL (ref 32.0–36.0)
MCV: 96 fL (ref 80.0–100.0)
Monocytes Absolute: 0.8 10*3/uL (ref 0.2–1.0)
Monocytes Relative: 14 %
Neutro Abs: 3.8 10*3/uL (ref 1.4–6.5)
Neutrophils Relative %: 63 %
Platelets: 228 10*3/uL (ref 150–440)
RBC: 3.23 MIL/uL — ABNORMAL LOW (ref 4.40–5.90)
RDW: 17.3 % — ABNORMAL HIGH (ref 11.5–14.5)
WBC: 5.9 10*3/uL (ref 3.8–10.6)

## 2014-07-13 ENCOUNTER — Other Ambulatory Visit: Payer: Self-pay | Admitting: *Deleted

## 2014-07-13 ENCOUNTER — Telehealth: Payer: Self-pay | Admitting: *Deleted

## 2014-07-13 DIAGNOSIS — C9000 Multiple myeloma not having achieved remission: Secondary | ICD-10-CM

## 2014-07-13 MED ORDER — ALPRAZOLAM 0.5 MG PO TABS
ORAL_TABLET | ORAL | Status: DC
Start: 2014-07-13 — End: 2014-08-04

## 2014-07-13 MED ORDER — HYDROMORPHONE HCL 2 MG PO TABS: ORAL_TABLET | ORAL | Status: DC

## 2014-07-13 MED ORDER — FENTANYL 100 MCG/HR TD PT72: 100.0000 ug | MEDICATED_PATCH | TRANSDERMAL | Status: DC

## 2014-07-13 MED ORDER — FENTANYL 25 MCG/HR TD PT72: 25.0000 ug | MEDICATED_PATCH | TRANSDERMAL | Status: DC

## 2014-07-13 NOTE — Telephone Encounter (Signed)
Xanax faxed

## 2014-07-13 NOTE — Telephone Encounter (Signed)
Informed that prescription is ready to pick up  

## 2014-07-21 ENCOUNTER — Inpatient Hospital Stay: Payer: Medicaid Other

## 2014-07-21 DIAGNOSIS — C9000 Multiple myeloma not having achieved remission: Secondary | ICD-10-CM

## 2014-07-21 LAB — CBC WITH DIFFERENTIAL/PLATELET
BASOS PCT: 1 %
Basophils Absolute: 0.1 10*3/uL (ref 0–0.1)
Eosinophils Absolute: 0.3 10*3/uL (ref 0–0.7)
Eosinophils Relative: 5 %
HCT: 35.6 % — ABNORMAL LOW (ref 40.0–52.0)
Hemoglobin: 11.8 g/dL — ABNORMAL LOW (ref 13.0–18.0)
LYMPHS ABS: 1.2 10*3/uL (ref 1.0–3.6)
Lymphocytes Relative: 20 %
MCH: 32.3 pg (ref 26.0–34.0)
MCHC: 33.1 g/dL (ref 32.0–36.0)
MCV: 97.5 fL (ref 80.0–100.0)
MONOS PCT: 13 %
Monocytes Absolute: 0.8 10*3/uL (ref 0.2–1.0)
NEUTROS ABS: 3.7 10*3/uL (ref 1.4–6.5)
Neutrophils Relative %: 61 %
Platelets: 203 10*3/uL (ref 150–440)
RBC: 3.65 MIL/uL — ABNORMAL LOW (ref 4.40–5.90)
RDW: 17 % — ABNORMAL HIGH (ref 11.5–14.5)
WBC: 6.1 10*3/uL (ref 3.8–10.6)

## 2014-07-22 LAB — KAPPA/LAMBDA LIGHT CHAINS
Kappa free light chain: 115.73 mg/L — ABNORMAL HIGH (ref 3.30–19.40)
Kappa, lambda light chain ratio: 18.31 — ABNORMAL HIGH (ref 0.26–1.65)
Lambda free light chains: 6.32 mg/L (ref 5.71–26.30)

## 2014-07-22 LAB — PROTEIN ELECTROPHORESIS, SERUM
A/G Ratio: 1.2 (ref 0.7–1.7)
Albumin ELP: 3 g/dL (ref 2.9–4.4)
Alpha-1-Globulin: 0.3 g/dL (ref 0.0–0.4)
Alpha-2-Globulin: 0.8 g/dL (ref 0.4–1.0)
Beta Globulin: 0.7 g/dL (ref 0.7–1.3)
GAMMA GLOBULIN: 0.7 g/dL (ref 0.4–1.8)
Globulin, Total: 2.6 g/dL (ref 2.2–3.9)
M-Spike, %: 0.6 g/dL — ABNORMAL HIGH
Total Protein ELP: 5.6 g/dL — ABNORMAL LOW (ref 6.0–8.5)

## 2014-08-04 ENCOUNTER — Telehealth: Payer: Self-pay | Admitting: *Deleted

## 2014-08-04 ENCOUNTER — Inpatient Hospital Stay: Payer: Medicaid Other | Attending: Internal Medicine

## 2014-08-04 ENCOUNTER — Inpatient Hospital Stay (HOSPITAL_BASED_OUTPATIENT_CLINIC_OR_DEPARTMENT_OTHER): Payer: Medicaid Other | Admitting: Internal Medicine

## 2014-08-04 ENCOUNTER — Inpatient Hospital Stay: Payer: Medicaid Other

## 2014-08-04 VITALS — BP 105/68 | HR 61 | Temp 95.9°F | Resp 18 | Ht 73.0 in | Wt 129.6 lb

## 2014-08-04 DIAGNOSIS — J449 Chronic obstructive pulmonary disease, unspecified: Secondary | ICD-10-CM

## 2014-08-04 DIAGNOSIS — F419 Anxiety disorder, unspecified: Secondary | ICD-10-CM | POA: Diagnosis not present

## 2014-08-04 DIAGNOSIS — F17210 Nicotine dependence, cigarettes, uncomplicated: Secondary | ICD-10-CM | POA: Diagnosis not present

## 2014-08-04 DIAGNOSIS — R509 Fever, unspecified: Secondary | ICD-10-CM | POA: Insufficient documentation

## 2014-08-04 DIAGNOSIS — I2510 Atherosclerotic heart disease of native coronary artery without angina pectoris: Secondary | ICD-10-CM | POA: Insufficient documentation

## 2014-08-04 DIAGNOSIS — Z79899 Other long term (current) drug therapy: Secondary | ICD-10-CM | POA: Diagnosis not present

## 2014-08-04 DIAGNOSIS — G620 Drug-induced polyneuropathy: Secondary | ICD-10-CM | POA: Insufficient documentation

## 2014-08-04 DIAGNOSIS — C9000 Multiple myeloma not having achieved remission: Secondary | ICD-10-CM | POA: Diagnosis present

## 2014-08-04 DIAGNOSIS — F1910 Other psychoactive substance abuse, uncomplicated: Secondary | ICD-10-CM | POA: Insufficient documentation

## 2014-08-04 DIAGNOSIS — F1010 Alcohol abuse, uncomplicated: Secondary | ICD-10-CM | POA: Diagnosis not present

## 2014-08-04 DIAGNOSIS — R600 Localized edema: Secondary | ICD-10-CM | POA: Diagnosis not present

## 2014-08-04 DIAGNOSIS — Z9981 Dependence on supplemental oxygen: Secondary | ICD-10-CM | POA: Insufficient documentation

## 2014-08-04 DIAGNOSIS — T451X5S Adverse effect of antineoplastic and immunosuppressive drugs, sequela: Secondary | ICD-10-CM | POA: Diagnosis not present

## 2014-08-04 DIAGNOSIS — D649 Anemia, unspecified: Secondary | ICD-10-CM

## 2014-08-04 LAB — BASIC METABOLIC PANEL
ANION GAP: 6 (ref 5–15)
BUN: 12 mg/dL (ref 6–20)
CHLORIDE: 104 mmol/L (ref 101–111)
CO2: 26 mmol/L (ref 22–32)
Calcium: 7.5 mg/dL — ABNORMAL LOW (ref 8.9–10.3)
Creatinine, Ser: 1.12 mg/dL (ref 0.61–1.24)
GFR calc non Af Amer: >60 mL/min (ref >60)
Glucose, Bld: 86 mg/dL (ref 65–99)
POTASSIUM: 2.9 mmol/L — AB (ref 3.5–5.1)
Sodium: 136 mmol/L (ref 135–145)

## 2014-08-04 LAB — HEPATIC FUNCTION PANEL
ALT: 10 U/L — AB (ref 17–63)
AST: 13 U/L — ABNORMAL LOW (ref 15–41)
Albumin: 3.3 g/dL — ABNORMAL LOW (ref 3.5–5.0)
Alkaline Phosphatase: 29 U/L — ABNORMAL LOW (ref 38–126)
Bilirubin, Direct: 0.1 mg/dL (ref 0.1–0.5)
Indirect Bilirubin: 0.4 mg/dL (ref 0.3–0.9)
TOTAL PROTEIN: 5.9 g/dL — AB (ref 6.5–8.1)
Total Bilirubin: 0.5 mg/dL (ref 0.3–1.2)

## 2014-08-04 LAB — CBC WITH DIFFERENTIAL/PLATELET
Basophils Absolute: 0 10*3/uL (ref 0–0.1)
Basophils Relative: 1 %
EOS PCT: 1 %
Eosinophils Absolute: 0.1 10*3/uL (ref 0–0.7)
HCT: 31.4 % — ABNORMAL LOW (ref 40.0–52.0)
HEMOGLOBIN: 10.6 g/dL — AB (ref 13.0–18.0)
Lymphocytes Relative: 26 %
Lymphs Abs: 1.2 10*3/uL (ref 1.0–3.6)
MCH: 32.8 pg (ref 26.0–34.0)
MCHC: 33.7 g/dL (ref 32.0–36.0)
MCV: 97.3 fL (ref 80.0–100.0)
Monocytes Absolute: 0.7 10*3/uL (ref 0.2–1.0)
Monocytes Relative: 15 %
NEUTROS ABS: 2.6 10*3/uL (ref 1.4–6.5)
Neutrophils Relative %: 57 %
Platelets: 244 10*3/uL (ref 150–440)
RBC: 3.23 MIL/uL — ABNORMAL LOW (ref 4.40–5.90)
RDW: 16 % — ABNORMAL HIGH (ref 11.5–14.5)
WBC: 4.6 10*3/uL (ref 3.8–10.6)

## 2014-08-04 MED ORDER — ALPRAZOLAM 0.5 MG PO TABS: ORAL_TABLET | ORAL | Status: DC

## 2014-08-04 MED ORDER — EPOETIN ALFA 40000 UNIT/ML IJ SOLN: 40000.0000 [IU] | Freq: Once | INTRAMUSCULAR | Status: DC

## 2014-08-04 MED ORDER — PROMETHAZINE HCL 25 MG PO TABS: 25.0000 mg | ORAL_TABLET | ORAL | Status: DC | PRN

## 2014-08-04 MED ORDER — POTASSIUM CHLORIDE CRYS ER 20 MEQ PO TBCR: EXTENDED_RELEASE_TABLET | ORAL | Status: DC

## 2014-08-04 MED ORDER — HYDROMORPHONE HCL 2 MG PO TABS: ORAL_TABLET | ORAL | Status: DC

## 2014-08-04 MED ORDER — ZOLEDRONIC ACID 4 MG/5ML IV CONC: 4.0000 mg | Freq: Once | INTRAVENOUS | Status: DC

## 2014-08-04 NOTE — Progress Notes (Signed)
Patient states that overall he has been doing pretty good. He has been running a fever 2-3 times a week. He does not know exactly what his temp. was, but he is sure he had fever because he was so hot.

## 2014-08-04 NOTE — Telephone Encounter (Signed)
Pt in office and requested nausea medicine refill. Was using zofran and feels like promethazine worked better for him and is requesting refill of promethazine. Dr. Ma Hillock verbally gave permission to call in 25 mg tablets, 1 q4-6 hours as needed with #60 and 2 refills, it was called into his pharmacy

## 2014-08-05 ENCOUNTER — Telehealth: Payer: Self-pay | Admitting: *Deleted

## 2014-08-05 NOTE — Telephone Encounter (Signed)
Medicaid is inactive at the present time so they will not ship med. States contacted pt who said he will go to social security office to try to get it straightened out, but in the mean time [facility] will fax over the companies patietn assistance application

## 2014-08-10 ENCOUNTER — Telehealth: Payer: Self-pay | Admitting: *Deleted

## 2014-08-10 NOTE — Telephone Encounter (Signed)
Got a paper from [employer] for pt financial asst and filled it out and faxed back to [employer]. And also pt notified that I did it.

## 2014-08-10 NOTE — Telephone Encounter (Signed)
Pt called to say that he went to DSS in [place] and told them that he moved from daughter house to mother's house and medicaid was reinstated. He contacted [facility] and medicaid active and they sent pt the revlimid.  I had spoke to them on Sunday to get info about household income to fill out [employer] asst program and faxed it Sunday.  They wanted me to let [employer] know that they no longer need asst program.  I faxed a request to [employer] to d/c the request for asst .

## 2014-08-11 NOTE — Progress Notes (Signed)
[facility] [place]  [phone] [phone]     ID: [name] OB: [birth year]  MR#: [id number]  UUV#:[id number]  Patient Care Team: Petra Kuba, MD as PCP - General (Family Medicine)  CHIEF COMPLAINT/DIAGNOSIS:  Multiple myeloma. Bone marrow biopsy 09/30/13 c/w myeloma, abnormal myeloma FISH panel (Aneuploidy - gain of chromosome 7,9,15,FGFR3/4p16.3, and CCND1/11q13. Loss of MAF/16q23.1), cytogenetics normal 46XY, flow cytometry reported 6% CD56+ monoclonal plasma cell population.  (presented with PET-positive lytic bone lesions, unintentional weight loss, anemia, renal insufficiency, hypercalcemia, monoclonal paraproteinuria. SPEP pending. ELevated serum kappa and lambda levels) PET scan on 09/26/13 - IMPRESSION: Multifocal hypermetabolic lytic osseous lesions.  Patient  initially treated with Velcade/Revlimid/Decadron/Zometa. Repeat Bone Marrow biopsy March 2016 reported significant decrease in plasma cells indicative of good response and excellent partial remission. Got evaluated by [facility] for treatment to be a candidate for transplant given comorbidities and poor nutritional status. Continues on Revlimid/weekly Decadron, and Zometa.   HISTORY OF PRESENT ILLNESS:  Patient returns for continued oncology f/u evaluation and plan continue treatment for myeloma, he has been on regimen including Velcade/Revlimid/Decadron.  Eating and drinking better. States that the tingling and numbness in his feet and lower legs is constantly bothersome, and has gotten worse to the point that the ground feels abnormal when he walks on it and he has some imbalance. Denies falls or loss of consciousness. He has some tingling and numbness in the fingertips but no difficulty in using hands. No new leg weakness, urinary or fecal retention or incontinence. No nausea or vomiting. No fever or chills. Denies bleeding issues. Pain fluctuates 2 to  8/10. No new mood disturbances.   REVIEW OF SYSTEMS:   ROS As in HPI above. In addition, no fever, chills or sweats. No new headaches or focal weakness.  No new mood disturbances. No  sore throat, cough, hemoptysis or chest pain. No dizziness or palpitation. No abdominal pain, constipation, diarrhea, dysuria or hematuria. No new skin rash or bleeding symptoms. No new paresthesias in extremities. PS ECOG 2.  PAST MEDICAL HISTORY: Reviewed.         Coronary artery disease; had nonobstructive disease on left heart catheterization.  Dilated cardiomyopathy with EF of 20% status post AICD placement.   Kidney stones.   Asthma.   COPD.   Polysubstance abuse.   Anxiety.   AICD placement.  PAST SURGICAL HISTORY: Reviewed. As above  FAMILY HISTORY: Reviewed. Remarkable for heart disease, hypertension, anemia. Grandfather had colon cancer and prostate cancer. One aunt had bone cancer.  SOCIAL HISTORY: Reviewed. Chronic smoker, one pack per day x35 years. Denies alcohol usage. History of recreational drug usage (crack cocaine recently June 2015, occasional marijuana, last IV drug usage in late 1980s).  No Known Allergies  Current Outpatient Prescriptions  Medication Sig Dispense Refill  . ALPRAZolam (XANAX) 0.5 MG tablet 1-2 tabs three times a day as needed 90 tablet 0  . dexamethasone (DECADRON) 4 MG tablet Take 4 mg by mouth. 10 tabs (6m) orally once a week    . fentaNYL (DURAGESIC - DOSED MCG/HR) 100 MCG/HR Place 1 patch (100 mcg total) onto the skin every other day. Use with a 25 mcg patch 15 patch 0  . fentaNYL (DURAGESIC - DOSED MCG/HR) 25 MCG/HR patch Place 1 patch (25 mcg total) onto the skin every other day. Use with a 100 mcg patch 15 patch 0  . HYDROmorphone (DILAUDID) 2 MG tablet 1-2 tabs every 2 hours as  needed for severe pain (7-10) 120 tablet 0  . lenalidomide (REVLIMID) 25 MG capsule Take 25 mg by mouth daily. Take 1 capsule by mouth every day for 21 days, then 7 days off    .  potassium chloride SA (K-DUR,KLOR-CON) 20 MEQ tablet Take one tablet 2 times daily for 4 days, then continue one tablet once daily for 10 days. 18 tablet 0  . promethazine (PHENERGAN) 25 MG tablet Take 1 tablet (25 mg total) by mouth every 4 (four) hours as needed for nausea or vomiting. 60 tablet 2   No current facility-administered medications for this visit.    PHYSICAL EXAM: Filed Vitals:   08/04/14 1017  BP: 105/68  Pulse: 61  Temp: 95.9 F (35.5 C)  Resp: 18     Body mass index is 17.11 kg/(m^2).    ECOG FS:2 - Symptomatic, <50% confined to bed.   GENERAL: Patient is alert and oriented and in no acute distress. There is no icterus.Mild pallor.  HEENT: EOMs intact. Oral exam negative for thrush or lesions. No cervical lymphadenopathy. CVS: S1S2, regular LUNGS: Bilaterally diminished breath sounds overall, occasional rhonchi. ABDOMEN: Soft, nontender. No hepatosplenomegaly clinically.  NEURO: grossly nonfocal, cranial nerves are intact.   EXTREMITIES: No pedal edema.  LAB RESULTS:    Component Value Date/Time   NA 136 08/04/2014 1005   NA 135 05/12/2014 1343   K 2.9* 08/04/2014 1005   K 3.3* 05/12/2014 1343   CL 104 08/04/2014 1005   CL 101 05/12/2014 1343   CO2 26 08/04/2014 1005   CO2 27 05/12/2014 1343   GLUCOSE 86 08/04/2014 1005   GLUCOSE 77 05/12/2014 1343   BUN 12 08/04/2014 1005   BUN 9 05/12/2014 1343   CREATININE 1.12 08/04/2014 1005   CREATININE 0.89 05/12/2014 1343   CALCIUM 7.5* 08/04/2014 1005   CALCIUM 8.8* 05/12/2014 1343   PROT 5.9* 08/04/2014 1006   PROT 5.9* 02/17/2014 1405   ALBUMIN 3.3* 08/04/2014 1006   ALBUMIN 2.8* 02/17/2014 1405   AST 13* 08/04/2014 1006   AST 9* 02/17/2014 1405   ALT 10* 08/04/2014 1006   ALT 12* 02/17/2014 1405   ALKPHOS 29* 08/04/2014 1006   ALKPHOS 44* 02/17/2014 1405   BILITOT 0.5 08/04/2014 1006   BILITOT 0.2 02/17/2014 1405   GFRNONAA >60 08/04/2014 1005   GFRNONAA >60 05/12/2014 1343   GFRAA >60 08/04/2014  1005   GFRAA >60 05/12/2014 1343   Lab Results  Component Value Date   WBC 4.6 08/04/2014   NEUTROABS 2.6 08/04/2014   HGB 10.6* 08/04/2014   HCT 31.4* 08/04/2014   MCV 97.3 08/04/2014   PLT 244 08/04/2014     STUDIES: 09/26/13 - PET scan. IMPRESSION: Multifocal hypermetabolic lytic osseous lesions, as described above, including a dominant 2.8 x 1.8 cm lesion in the left acetabulum/posterior column. This appearance is compatible with suspected multiple myeloma, less likely lytic metastases related to an unknown primary. 7 mm short axis hypermetabolic right paratracheal node, indeterminate, favored to be reactive in the absence of additional lymphadenopathy.  09/26/13 - Hb 8.1, Cr 1.62, Ca 10.3.  HIV Ab, HCV Ab, HBsAg negative. Otherwise serum PSA, AFP, CEA, CA19-9 all unremarkable.  09/30/13 - Free K+L Lt Chains,Qn,S Free Kappa Lt Chains,S  1121.00 mg/L (3.30-19.40) Free Lambda Lt Chains,S  4.00 mg/L   (5.71-26.30) Kappa/Lambda Ratio,S     280.25      (0.26-1.65)  12/05/13 - SIEP with M-spike 1.7 (was 5.3 on 10/17/13), serum IgG 2187 (was 7690)  Free Kappa Lt Chains,S   298.82 mg/L  (3.30-19.40) Free Lambda Lt Chains,S    3.68 mg/L  (5.71-26.30) Kappa/Lambda Ratio,S      81.20       (0.26-1.65)  01/16/14 - SIEP with M-spike 0.8.   ASSESSMENT / PLAN:   1. Multiple myeloma. Bone marrow biopsy 09/30/13 c/w myeloma, abnormal myeloma FISH panel (Aneuploidy - gain of chromosome 7,9,15,FGFR3/4p16.3, and CCND1/11q13. Loss of MAF/16q23.1), cytogenetics normal 46XY. Presented with multiple PET-positive lytic bone lesions, unintentional weight loss, anemia, renal insufficiency, hypercalcemia, monoclonal paraproteinuria, SPEP pending, elevated kappa and lambda levels -  have reviewed labs from today and explained to patient and family present. Most recent repeat SIEP showed further significant drop in the M-spike. Repeat bone marrow biopsy March 2016 reports significant decrease in plasma cells indicative  of good response and excellent partial remission. Got evaluated by [facility] for treatment to be a candidate for transplant given comorbidities and poor nutritional status. Given peripheral neuropathy (now grade 2), will continue on with Revlimid and Decadron, recent SPEP and kappa/lambda ratio continues to improve indicative of response to treatment. Monitor CBC every 2 weekly. Repeat SIEP and Lambda ratio at 10 weeks. MD follow up at 12 weeks.  2. Bisphosphonate therapy -  Zometa 3.5 mg every 6 weekly, monitor calcium and creatinine prior to each dose.  3. Anemia - continue Procrit 40000 units subcutaneous q 2weekly if Hb is 10 or less. 4. Mild pedal edema - better, on Lasix plus potassium supplement as needed. 5. Pain - better controlled at this time. Continue on fentanyl patch 125 mcg per hour q.48 hours, Dilaudid p.r.n. for breakthrough pain.  6. COPD - on home O2. On Ventolin for dyspnea or wheezing. 7. Peripheral Neuropathy - does not want to take gabapentin at this time. Velcade has been stopped. In between visits, patient advised to call or come to ER in case of any progressive symptoms or acute sickness. He is agreeable to this plan.     Leia Alf, MD   08/11/2014 2:25 PM

## 2014-08-17 ENCOUNTER — Other Ambulatory Visit: Payer: Self-pay | Admitting: Oncology

## 2014-08-17 ENCOUNTER — Other Ambulatory Visit: Payer: Self-pay | Admitting: *Deleted

## 2014-08-17 ENCOUNTER — Telehealth: Payer: Self-pay | Admitting: *Deleted

## 2014-08-17 DIAGNOSIS — C9000 Multiple myeloma not having achieved remission: Secondary | ICD-10-CM

## 2014-08-17 MED ORDER — FENTANYL 100 MCG/HR TD PT72: 100.0000 ug | MEDICATED_PATCH | TRANSDERMAL | Status: DC

## 2014-08-17 MED ORDER — FENTANYL 25 MCG/HR TD PT72: 25.0000 ug | MEDICATED_PATCH | TRANSDERMAL | Status: DC

## 2014-08-17 NOTE — Telephone Encounter (Signed)
Pt is requesting refills on his Fentanyl 100 mcg and 25 mcg patches.[name] KitchenMarland KitchenMarland [name]

## 2014-08-18 ENCOUNTER — Other Ambulatory Visit: Payer: Self-pay | Admitting: *Deleted

## 2014-08-18 ENCOUNTER — Inpatient Hospital Stay: Payer: Medicaid Other

## 2014-08-18 ENCOUNTER — Telehealth: Payer: Self-pay | Admitting: *Deleted

## 2014-08-18 DIAGNOSIS — C9000 Multiple myeloma not having achieved remission: Secondary | ICD-10-CM | POA: Diagnosis not present

## 2014-08-18 LAB — CBC WITH DIFFERENTIAL/PLATELET
BASOS ABS: 0 10*3/uL (ref 0–0.1)
Basophils Relative: 1 %
EOS ABS: 0.2 10*3/uL (ref 0–0.7)
Eosinophils Relative: 4 %
HEMATOCRIT: 35.4 % — AB (ref 40.0–52.0)
Hemoglobin: 11.6 g/dL — ABNORMAL LOW (ref 13.0–18.0)
LYMPHS PCT: 21 %
Lymphs Abs: 1.4 10*3/uL (ref 1.0–3.6)
MCH: 32.3 pg (ref 26.0–34.0)
MCHC: 32.7 g/dL (ref 32.0–36.0)
MCV: 99 fL (ref 80.0–100.0)
MONOS PCT: 13 %
Monocytes Absolute: 0.9 10*3/uL (ref 0.2–1.0)
NEUTROS ABS: 4.1 10*3/uL (ref 1.4–6.5)
Neutrophils Relative %: 61 %
Platelets: 209 10*3/uL (ref 150–440)
RBC: 3.58 MIL/uL — AB (ref 4.40–5.90)
RDW: 16.4 % — ABNORMAL HIGH (ref 11.5–14.5)
WBC: 6.6 10*3/uL (ref 3.8–10.6)

## 2014-08-18 LAB — CREATININE, SERUM
Creatinine, Ser: 1.12 mg/dL (ref 0.61–1.24)
GFR calc Af Amer: >60 mL/min (ref >60)
GFR calc non Af Amer: >60 mL/min (ref >60)

## 2014-08-18 LAB — POTASSIUM: Potassium: 4 mmol/L (ref 3.5–5.1)

## 2014-08-18 MED ORDER — FENTANYL 12 MCG/HR TD PT72: 12.5000 ug | MEDICATED_PATCH | TRANSDERMAL | Status: DC

## 2014-08-18 NOTE — Telephone Encounter (Signed)
Pt states that he came by and picked up pain rx for duragesic patch 125 and he feels like he is taking more dilaudid due to the bilateral right above hip bone right more than left.  i checked with dr Mike Gip and she gave inc. Of duragesic 12 mcg that now will be total of 165mcg.  Pt made aware of new rx.

## 2014-08-21 ENCOUNTER — Telehealth: Payer: Self-pay | Admitting: *Deleted

## 2014-08-21 DIAGNOSIS — C9000 Multiple myeloma not having achieved remission: Secondary | ICD-10-CM

## 2014-08-21 MED ORDER — DEXAMETHASONE 4 MG PO TABS: ORAL_TABLET | ORAL | Status: DC

## 2014-08-21 NOTE — Telephone Encounter (Signed)
Escribed

## 2014-09-01 ENCOUNTER — Other Ambulatory Visit: Payer: Self-pay | Admitting: Internal Medicine

## 2014-09-01 ENCOUNTER — Inpatient Hospital Stay: Payer: Medicaid Other | Attending: Internal Medicine

## 2014-09-01 ENCOUNTER — Inpatient Hospital Stay: Payer: Medicaid Other

## 2014-09-01 DIAGNOSIS — C9000 Multiple myeloma not having achieved remission: Secondary | ICD-10-CM

## 2014-09-01 LAB — CBC WITH DIFFERENTIAL/PLATELET
BASOS PCT: 0 %
Basophils Absolute: 0 10*3/uL (ref 0–0.1)
EOS ABS: 0.1 10*3/uL (ref 0–0.7)
Eosinophils Relative: 2 %
HCT: 33.4 % — ABNORMAL LOW (ref 40.0–52.0)
HEMOGLOBIN: 11.2 g/dL — AB (ref 13.0–18.0)
LYMPHS PCT: 23 %
Lymphs Abs: 1.5 10*3/uL (ref 1.0–3.6)
MCH: 32.7 pg (ref 26.0–34.0)
MCHC: 33.7 g/dL (ref 32.0–36.0)
MCV: 97.1 fL (ref 80.0–100.0)
MONO ABS: 0.9 10*3/uL (ref 0.2–1.0)
Monocytes Relative: 13 %
Neutro Abs: 4 10*3/uL (ref 1.4–6.5)
Neutrophils Relative %: 62 %
Platelets: 224 10*3/uL (ref 150–440)
RBC: 3.44 MIL/uL — AB (ref 4.40–5.90)
RDW: 15.8 % — ABNORMAL HIGH (ref 11.5–14.5)
WBC: 6.4 10*3/uL (ref 3.8–10.6)

## 2014-09-01 MED ORDER — HYDROMORPHONE HCL 2 MG PO TABS: ORAL_TABLET | ORAL | Status: DC

## 2014-09-01 MED ORDER — ALPRAZOLAM 0.5 MG PO TABS: ORAL_TABLET | ORAL | Status: DC

## 2014-09-15 ENCOUNTER — Inpatient Hospital Stay: Payer: Medicaid Other

## 2014-09-15 ENCOUNTER — Other Ambulatory Visit: Payer: Self-pay | Admitting: Internal Medicine

## 2014-09-15 DIAGNOSIS — C9000 Multiple myeloma not having achieved remission: Secondary | ICD-10-CM | POA: Diagnosis not present

## 2014-09-15 LAB — BASIC METABOLIC PANEL
Anion gap: 5 (ref 5–15)
BUN: 12 mg/dL (ref 6–20)
CHLORIDE: 105 mmol/L (ref 101–111)
CO2: 28 mmol/L (ref 22–32)
Calcium: 8.2 mg/dL — ABNORMAL LOW (ref 8.9–10.3)
Creatinine, Ser: 1.03 mg/dL (ref 0.61–1.24)
GFR calc Af Amer: >60 mL/min (ref >60)
GFR calc non Af Amer: >60 mL/min (ref >60)
Glucose, Bld: 86 mg/dL (ref 65–99)
Potassium: 3.2 mmol/L — ABNORMAL LOW (ref 3.5–5.1)
SODIUM: 138 mmol/L (ref 135–145)

## 2014-09-15 LAB — CBC WITH DIFFERENTIAL/PLATELET
Basophils Absolute: 0.1 10*3/uL (ref 0–0.1)
Basophils Relative: 1 %
EOS ABS: 0.3 10*3/uL (ref 0–0.7)
EOS PCT: 4 %
HCT: 33.7 % — ABNORMAL LOW (ref 40.0–52.0)
HEMOGLOBIN: 11.3 g/dL — AB (ref 13.0–18.0)
LYMPHS ABS: 1.5 10*3/uL (ref 1.0–3.6)
Lymphocytes Relative: 19 %
MCH: 32.5 pg (ref 26.0–34.0)
MCHC: 33.5 g/dL (ref 32.0–36.0)
MCV: 97.1 fL (ref 80.0–100.0)
MONOS PCT: 12 %
Monocytes Absolute: 0.9 10*3/uL (ref 0.2–1.0)
Neutro Abs: 5 10*3/uL (ref 1.4–6.5)
Neutrophils Relative %: 64 %
Platelets: 230 10*3/uL (ref 150–440)
RBC: 3.48 MIL/uL — ABNORMAL LOW (ref 4.40–5.90)
RDW: 16 % — ABNORMAL HIGH (ref 11.5–14.5)
WBC: 7.9 10*3/uL (ref 3.8–10.6)

## 2014-09-15 MED ORDER — GABAPENTIN 100 MG PO CAPS: 100.0000 mg | ORAL_CAPSULE | Freq: Three times a day (TID) | ORAL | Status: DC

## 2014-09-15 MED ORDER — POTASSIUM CHLORIDE CRYS ER 20 MEQ PO TBCR: EXTENDED_RELEASE_TABLET | ORAL | Status: DC

## 2014-09-15 MED ORDER — FENTANYL 100 MCG/HR TD PT72: MEDICATED_PATCH | TRANSDERMAL | Status: DC

## 2014-09-15 MED ORDER — FENTANYL 12 MCG/HR TD PT72: MEDICATED_PATCH | TRANSDERMAL | Status: DC

## 2014-09-15 MED ORDER — ZOLEDRONIC ACID 4 MG/100ML IV SOLN
4.0000 mg | Freq: Once | INTRAVENOUS | Status: AC
  Administered 2014-09-15: 4 mg via INTRAVENOUS
  Filled 2014-09-15: qty 100

## 2014-09-15 MED ORDER — SODIUM CHLORIDE 0.9 % IV SOLN
INTRAVENOUS | Status: DC
  Administered 2014-09-15: 10:00:00 via INTRAVENOUS
  Filled 2014-09-15: qty 1000

## 2014-09-15 MED ORDER — FENTANYL 25 MCG/HR TD PT72: MEDICATED_PATCH | TRANSDERMAL | Status: DC

## 2014-09-25 ENCOUNTER — Telehealth: Payer: Self-pay | Admitting: *Deleted

## 2014-09-25 NOTE — Telephone Encounter (Signed)
Got rx refill from [facility] and went on line to get auth # and faxed the new rx into [facility].  I have also called pt back and got his voicemail and left him a message that has been sent in and [facility] said next week should be his 1 week off.

## 2014-09-25 NOTE — Telephone Encounter (Signed)
Called to report that they made an error in his dexamethasone prescription and they are correcting it. Instead of him taking 40 mg of Decadron, he was only taking 20 mg weekly

## 2014-09-25 NOTE — Telephone Encounter (Signed)
I called [facility] and they said that pt is due for refill. He should have taken his last pill and next week should be his break.  She will fax me the refill paper. She states they already sent it today.

## 2014-09-25 NOTE — Telephone Encounter (Signed)
Patient called them stating he needs a refill on his Revlimid by Monday, please fax prescription today so they can process it and get his med sent out

## 2014-09-29 ENCOUNTER — Telehealth: Payer: Self-pay | Admitting: *Deleted

## 2014-09-29 ENCOUNTER — Inpatient Hospital Stay: Payer: Medicaid Other

## 2014-09-29 ENCOUNTER — Inpatient Hospital Stay: Payer: Medicaid Other | Attending: Internal Medicine

## 2014-09-29 DIAGNOSIS — C9000 Multiple myeloma not having achieved remission: Secondary | ICD-10-CM | POA: Diagnosis present

## 2014-09-29 DIAGNOSIS — D649 Anemia, unspecified: Secondary | ICD-10-CM | POA: Diagnosis not present

## 2014-09-29 LAB — CBC WITH DIFFERENTIAL/PLATELET
Basophils Absolute: 0 10*3/uL (ref 0–0.1)
Basophils Relative: 1 %
EOS ABS: 0.2 10*3/uL (ref 0–0.7)
Eosinophils Relative: 4 %
HCT: 31 % — ABNORMAL LOW (ref 40.0–52.0)
HEMOGLOBIN: 10.6 g/dL — AB (ref 13.0–18.0)
LYMPHS ABS: 0.9 10*3/uL — AB (ref 1.0–3.6)
LYMPHS PCT: 20 %
MCH: 32.5 pg (ref 26.0–34.0)
MCHC: 34.1 g/dL (ref 32.0–36.0)
MCV: 95.3 fL (ref 80.0–100.0)
Monocytes Absolute: 0.7 10*3/uL (ref 0.2–1.0)
Monocytes Relative: 14 %
NEUTROS ABS: 2.9 10*3/uL (ref 1.4–6.5)
NEUTROS PCT: 61 %
Platelets: 256 10*3/uL (ref 150–440)
RBC: 3.25 MIL/uL — AB (ref 4.40–5.90)
RDW: 15.9 % — ABNORMAL HIGH (ref 11.5–14.5)
WBC: 4.8 10*3/uL (ref 3.8–10.6)

## 2014-09-29 MED ORDER — HYDROMORPHONE HCL 2 MG PO TABS: ORAL_TABLET | ORAL | Status: DC

## 2014-09-29 NOTE — Telephone Encounter (Signed)
Needs refill of dilaudid, needs refill of phenergan. Pt given rx for dilaudid and phenergan called into his pharmacy. Also when I went to give them rx for pain med. He states that he needs refill on xanax. Called into pharmacy and gave 1 refill. Pt knows that now both meds called in for him

## 2014-10-13 ENCOUNTER — Inpatient Hospital Stay: Payer: Medicaid Other

## 2014-10-13 ENCOUNTER — Other Ambulatory Visit: Payer: Self-pay | Admitting: *Deleted

## 2014-10-13 DIAGNOSIS — C9000 Multiple myeloma not having achieved remission: Secondary | ICD-10-CM

## 2014-10-13 LAB — CBC WITH DIFFERENTIAL/PLATELET
Basophils Absolute: 0.1 10*3/uL (ref 0–0.1)
Basophils Relative: 1 %
EOS ABS: 0.3 10*3/uL (ref 0–0.7)
Eosinophils Relative: 4 %
HCT: 32.1 % — ABNORMAL LOW (ref 40.0–52.0)
HEMOGLOBIN: 10.6 g/dL — AB (ref 13.0–18.0)
LYMPHS ABS: 1.3 10*3/uL (ref 1.0–3.6)
Lymphocytes Relative: 14 %
MCH: 31.2 pg (ref 26.0–34.0)
MCHC: 33 g/dL (ref 32.0–36.0)
MCV: 94.7 fL (ref 80.0–100.0)
MONO ABS: 0.7 10*3/uL (ref 0.2–1.0)
MONOS PCT: 8 %
NEUTROS PCT: 73 %
Neutro Abs: 6.8 10*3/uL — ABNORMAL HIGH (ref 1.4–6.5)
Platelets: 276 10*3/uL (ref 150–440)
RBC: 3.39 MIL/uL — ABNORMAL LOW (ref 4.40–5.90)
RDW: 16.1 % — AB (ref 11.5–14.5)
WBC: 9.1 10*3/uL (ref 3.8–10.6)

## 2014-10-13 NOTE — Telephone Encounter (Signed)
Needs refill on patches

## 2014-10-14 ENCOUNTER — Other Ambulatory Visit: Payer: Self-pay | Admitting: Internal Medicine

## 2014-10-14 DIAGNOSIS — C9000 Multiple myeloma not having achieved remission: Secondary | ICD-10-CM

## 2014-10-14 LAB — KAPPA/LAMBDA LIGHT CHAINS
KAPPA FREE LGHT CHN: 75.71 mg/L — AB (ref 3.30–19.40)
Kappa, lambda light chain ratio: 10.12 — ABNORMAL HIGH (ref 0.26–1.65)
LAMDA FREE LIGHT CHAINS: 7.48 mg/L (ref 5.71–26.30)

## 2014-10-14 LAB — PROTEIN ELECTROPHORESIS, SERUM
A/G Ratio: 1 (ref 0.7–1.7)
ALPHA-1-GLOBULIN: 0.3 g/dL (ref 0.0–0.4)
ALPHA-2-GLOBULIN: 1 g/dL (ref 0.4–1.0)
Albumin ELP: 2.9 g/dL (ref 2.9–4.4)
BETA GLOBULIN: 0.8 g/dL (ref 0.7–1.3)
GAMMA GLOBULIN: 0.8 g/dL (ref 0.4–1.8)
Globulin, Total: 3 g/dL (ref 2.2–3.9)
M-SPIKE, %: 0.5 g/dL — AB
Total Protein ELP: 5.9 g/dL — ABNORMAL LOW (ref 6.0–8.5)

## 2014-10-14 MED ORDER — FENTANYL 25 MCG/HR TD PT72: MEDICATED_PATCH | TRANSDERMAL | Status: DC

## 2014-10-14 MED ORDER — FENTANYL 12 MCG/HR TD PT72: MEDICATED_PATCH | TRANSDERMAL | Status: DC

## 2014-10-14 MED ORDER — FENTANYL 100 MCG/HR TD PT72: MEDICATED_PATCH | TRANSDERMAL | Status: DC

## 2014-10-14 NOTE — Telephone Encounter (Signed)
Informed that prescription is ready to pick up  

## 2014-10-27 ENCOUNTER — Inpatient Hospital Stay: Payer: Medicaid Other

## 2014-10-27 ENCOUNTER — Inpatient Hospital Stay: Payer: Medicaid Other | Admitting: Internal Medicine

## 2014-10-28 ENCOUNTER — Inpatient Hospital Stay: Payer: Medicaid Other

## 2014-10-28 ENCOUNTER — Other Ambulatory Visit: Payer: Self-pay | Admitting: *Deleted

## 2014-10-28 ENCOUNTER — Other Ambulatory Visit: Payer: Self-pay | Admitting: Internal Medicine

## 2014-10-28 ENCOUNTER — Inpatient Hospital Stay (HOSPITAL_BASED_OUTPATIENT_CLINIC_OR_DEPARTMENT_OTHER): Payer: Medicaid Other | Admitting: Internal Medicine

## 2014-10-28 ENCOUNTER — Inpatient Hospital Stay: Payer: Medicaid Other | Attending: Internal Medicine

## 2014-10-28 VITALS — BP 126/78 | HR 74 | Temp 98.1°F | Wt 137.6 lb

## 2014-10-28 DIAGNOSIS — I420 Dilated cardiomyopathy: Secondary | ICD-10-CM | POA: Diagnosis not present

## 2014-10-28 DIAGNOSIS — G629 Polyneuropathy, unspecified: Secondary | ICD-10-CM | POA: Insufficient documentation

## 2014-10-28 DIAGNOSIS — F419 Anxiety disorder, unspecified: Secondary | ICD-10-CM | POA: Diagnosis not present

## 2014-10-28 DIAGNOSIS — L89229 Pressure ulcer of left hip, unspecified stage: Secondary | ICD-10-CM | POA: Insufficient documentation

## 2014-10-28 DIAGNOSIS — J45909 Unspecified asthma, uncomplicated: Secondary | ICD-10-CM

## 2014-10-28 DIAGNOSIS — Z9221 Personal history of antineoplastic chemotherapy: Secondary | ICD-10-CM | POA: Insufficient documentation

## 2014-10-28 DIAGNOSIS — D649 Anemia, unspecified: Secondary | ICD-10-CM | POA: Diagnosis not present

## 2014-10-28 DIAGNOSIS — Z79899 Other long term (current) drug therapy: Secondary | ICD-10-CM | POA: Diagnosis not present

## 2014-10-28 DIAGNOSIS — Z9981 Dependence on supplemental oxygen: Secondary | ICD-10-CM

## 2014-10-28 DIAGNOSIS — C9000 Multiple myeloma not having achieved remission: Secondary | ICD-10-CM

## 2014-10-28 DIAGNOSIS — J449 Chronic obstructive pulmonary disease, unspecified: Secondary | ICD-10-CM | POA: Diagnosis not present

## 2014-10-28 DIAGNOSIS — T148XXA Other injury of unspecified body region, initial encounter: Secondary | ICD-10-CM

## 2014-10-28 DIAGNOSIS — Z7982 Long term (current) use of aspirin: Secondary | ICD-10-CM | POA: Insufficient documentation

## 2014-10-28 DIAGNOSIS — Z95810 Presence of automatic (implantable) cardiac defibrillator: Secondary | ICD-10-CM | POA: Diagnosis not present

## 2014-10-28 DIAGNOSIS — I2510 Atherosclerotic heart disease of native coronary artery without angina pectoris: Secondary | ICD-10-CM | POA: Diagnosis not present

## 2014-10-28 DIAGNOSIS — L24A9 Irritant contact dermatitis due friction or contact with other specified body fluids: Secondary | ICD-10-CM

## 2014-10-28 DIAGNOSIS — F17210 Nicotine dependence, cigarettes, uncomplicated: Secondary | ICD-10-CM | POA: Diagnosis not present

## 2014-10-28 DIAGNOSIS — F1910 Other psychoactive substance abuse, uncomplicated: Secondary | ICD-10-CM | POA: Diagnosis not present

## 2014-10-28 DIAGNOSIS — Z79810 Long term (current) use of selective estrogen receptor modulators (SERMs): Secondary | ICD-10-CM

## 2014-10-28 LAB — BASIC METABOLIC PANEL
Anion gap: 5 (ref 5–15)
BUN: 8 mg/dL (ref 6–20)
CHLORIDE: 104 mmol/L (ref 101–111)
CO2: 32 mmol/L (ref 22–32)
CREATININE: 0.85 mg/dL (ref 0.61–1.24)
Calcium: 7.9 mg/dL — ABNORMAL LOW (ref 8.9–10.3)
GFR calc Af Amer: >60 mL/min (ref >60)
GFR calc non Af Amer: >60 mL/min (ref >60)
GLUCOSE: 93 mg/dL (ref 65–99)
POTASSIUM: 3.4 mmol/L — AB (ref 3.5–5.1)
SODIUM: 141 mmol/L (ref 135–145)

## 2014-10-28 LAB — HEPATIC FUNCTION PANEL
ALK PHOS: 29 U/L — AB (ref 38–126)
ALT: 9 U/L — AB (ref 17–63)
AST: 10 U/L — ABNORMAL LOW (ref 15–41)
Albumin: 3 g/dL — ABNORMAL LOW (ref 3.5–5.0)
BILIRUBIN TOTAL: 0.3 mg/dL (ref 0.3–1.2)
Total Protein: 5.8 g/dL — ABNORMAL LOW (ref 6.5–8.1)

## 2014-10-28 LAB — CBC WITH DIFFERENTIAL/PLATELET
Basophils Absolute: 0 10*3/uL (ref 0–0.1)
Basophils Relative: 1 %
EOS ABS: 0.2 10*3/uL (ref 0–0.7)
Eosinophils Relative: 4 %
HCT: 29.9 % — ABNORMAL LOW (ref 40.0–52.0)
HEMOGLOBIN: 10.1 g/dL — AB (ref 13.0–18.0)
LYMPHS ABS: 1.5 10*3/uL (ref 1.0–3.6)
LYMPHS PCT: 26 %
MCH: 32 pg (ref 26.0–34.0)
MCHC: 33.7 g/dL (ref 32.0–36.0)
MCV: 94.7 fL (ref 80.0–100.0)
MONOS PCT: 11 %
Monocytes Absolute: 0.6 10*3/uL (ref 0.2–1.0)
NEUTROS PCT: 58 %
Neutro Abs: 3.2 10*3/uL (ref 1.4–6.5)
Platelets: 230 10*3/uL (ref 150–440)
RBC: 3.16 MIL/uL — ABNORMAL LOW (ref 4.40–5.90)
RDW: 16.6 % — ABNORMAL HIGH (ref 11.5–14.5)
WBC: 5.6 10*3/uL (ref 3.8–10.6)

## 2014-10-28 MED ORDER — SODIUM CHLORIDE 0.9 % IV SOLN: 3.0000 mg | Freq: Once | INTRAVENOUS | Status: DC

## 2014-10-28 MED ORDER — SODIUM CHLORIDE 0.9 % IV SOLN
Freq: Once | INTRAVENOUS | Status: AC
  Administered 2014-10-28: 11:00:00 via INTRAVENOUS
  Filled 2014-10-28: qty 1000

## 2014-10-28 MED ORDER — HYDROMORPHONE HCL 2 MG PO TABS: ORAL_TABLET | ORAL | Status: DC

## 2014-10-28 MED ORDER — ZOLEDRONIC ACID 4 MG/100ML IV SOLN
4.0000 mg | Freq: Once | INTRAVENOUS | Status: AC
  Administered 2014-10-28: 4 mg via INTRAVENOUS
  Filled 2014-10-28: qty 100

## 2014-10-28 MED ORDER — POTASSIUM CHLORIDE CRYS ER 10 MEQ PO TBCR: 10.0000 meq | EXTENDED_RELEASE_TABLET | Freq: Every day | ORAL | Status: DC

## 2014-10-28 NOTE — Progress Notes (Signed)
Patient has an unstagable pressure ulcer on his left hip.  Will talk to Dr. Ma Hillock regarding wound clinic referal

## 2014-10-31 NOTE — Progress Notes (Signed)
[facility] [place]  [phone] [phone]     ID: [name] OB: 1961-01-13  MR#: [id number]  JJH#:[id number]  Patient Care Team: Petra Kuba, MD as PCP - General (Family Medicine)  CHIEF COMPLAINT/DIAGNOSIS:  Multiple myeloma. Bone marrow biopsy 09/30/13 c/w myeloma, abnormal myeloma FISH panel (Aneuploidy - gain of chromosome 7,9,15,FGFR3/4p16.3, and CCND1/11q13. Loss of MAF/16q23.1), cytogenetics normal 46XY, flow cytometry reported 6% CD56+ monoclonal plasma cell population.  (presented with PET-positive lytic bone lesions, unintentional weight loss, anemia, renal insufficiency, hypercalcemia, monoclonal paraproteinuria. SPEP pending. ELevated serum kappa and lambda levels) PET scan on 09/26/13 - IMPRESSION: Multifocal hypermetabolic lytic osseous lesions.  Patient  initially treated with Velcade/Revlimid/Decadron/Zometa. Repeat Bone Marrow biopsy March 2016 reported significant decrease in plasma cells indicative of good response and excellent partial remission. Got evaluated by [facility] for treatment to be a candidate for transplant given comorbidities and poor nutritional status. Continues on Revlimid/weekly Decadron, and Zometa.   HISTORY OF PRESENT ILLNESS:  Patient returns for continued oncology follow-up and continue treatment planning for myeloma as above. He is on regimen including Velcade/Revlimid/Decadron. States that he is eating better, has gained some weight back. He does tend to sit and rest a lot of the times, and has developed a pressure skin ulcer over the left lateral hip area. States that the tingling and numbness in his feet and lower legs is still bothersome but better controlled on current medication. Denies falls or loss of consciousness. He has some tingling and numbness in the fingertips but no difficulty in using hands. No new leg weakness, urinary or fecal retention or incontinence. No nausea  or vomiting. No fever or chills. Denies bleeding issues. Pain fluctuates 2 to 8/10.  REVIEW OF SYSTEMS:   ROS As in HPI above. In addition, no fevers, chills or sweats. No new headaches or focal weakness.  Denies new mood disturbances. No  sore throat, cough, hemoptysis or chest pain. No dizziness or palpitation. No abdominal pain, constipation, diarrhea, dysuria or hematuria. No bleeding symptoms. No new paresthesias in extremities. Denies seizures or loss of consciousness. PS ECOG 2.  PAST MEDICAL HISTORY: Reviewed.         Coronary artery disease; had nonobstructive disease on left heart catheterization.  Dilated cardiomyopathy with EF of 20% status post AICD placement.   Kidney stones.   Asthma.   COPD.   Polysubstance abuse.   Anxiety.   AICD placement.  PAST SURGICAL HISTORY: Reviewed. As above  FAMILY HISTORY: Reviewed. Remarkable for heart disease, hypertension, anemia. Grandfather had colon cancer and prostate cancer. One aunt had bone cancer.  SOCIAL HISTORY: Reviewed. Chronic smoker, one pack per day x35 years. Denies alcohol usage. History of recreational drug usage (crack cocaine recently June 2015, occasional marijuana, last IV drug usage in late 1980s).  No Known Allergies  Current Outpatient Prescriptions  Medication Sig Dispense Refill  . albuterol (PROVENTIL HFA;VENTOLIN HFA) 108 (90 BASE) MCG/ACT inhaler Inhale 2 puffs into the lungs every 6 (six) hours as needed for wheezing or shortness of breath.    . ALPRAZolam (XANAX) 0.5 MG tablet 1-2 tabs three times a day as needed 90 tablet 0  . amiodarone (PACERONE) 200 MG tablet Take 200 mg by mouth daily.    Marland Kitchen aspirin 325 MG EC tablet Take 325 mg by mouth daily.    . carvedilol (COREG) 6.25 MG tablet Take 6.25 mg by mouth 2 (two) times daily with a meal.    .  dexamethasone (DECADRON) 4 MG tablet 10 tabs (60m) orally once a week 20 tablet 0  . docusate sodium (COLACE) 100 MG capsule Take 100 mg by mouth 2 (two) times  daily.    . fentaNYL (DURAGESIC - DOSED MCG/HR) 100 MCG/HR 1 patch (100 mcg) transdermal q48 hours. Apply this in addition to 25 mcg patch and 12 mcg patch. 15 patch 0  . fentaNYL (DURAGESIC - DOSED MCG/HR) 12 MCG/HR 1 patch (12 mcg) transdermal q48 hours. Apply this in addition to 25 mcg patch and 100 mcg patch. 15 patch 0  . fentaNYL (DURAGESIC - DOSED MCG/HR) 25 MCG/HR patch 1 patch (25 mcg) transdermal q48 hours. Apply this in addition to 100 mcg patch and 12 mcg patch. 15 patch 0  . Fluticasone-Salmeterol (ADVAIR) 100-50 MCG/DOSE AEPB Inhale 1 puff into the lungs 2 (two) times daily.    . folic acid (FOLVITE) 1 MG tablet Take 1 mg by mouth daily.    .Marland Kitchengabapentin (NEURONTIN) 100 MG capsule Take 1 capsule (100 mg total) by mouth 3 (three) times daily. 90 capsule 3  . HYDROmorphone (DILAUDID) 2 MG tablet 1-2 tabs every 2 hours as needed for severe pain (7-10) 120 tablet 0  . lenalidomide (REVLIMID) 25 MG capsule Take 25 mg by mouth daily. Take 1 capsule by mouth every day for 21 days, then 7 days off    . lisinopril (PRINIVIL,ZESTRIL) 2.5 MG tablet Take 2.5 mg by mouth daily.    . polyethylene glycol (MIRALAX / GLYCOLAX) packet Take 17 g by mouth daily.    . promethazine (PHENERGAN) 25 MG tablet Take 1 tablet (25 mg total) by mouth every 4 (four) hours as needed for nausea or vomiting. 60 tablet 2  . potassium chloride SA (K-DUR,KLOR-CON) 10 MEQ tablet Take 1 tablet (10 mEq total) by mouth daily. 5 tablet 0   No current facility-administered medications for this visit.    PHYSICAL EXAM: Filed Vitals:   10/28/14 0907  BP: 126/78  Pulse: 74  Temp: 98.1 F (36.7 C)     Body mass index is 18.15 kg/(m^2).    ECOG FS:2 - Symptomatic, <50% confined to bed.   GENERAL: Alert and oriented and in no acute distress. No icterus.Mild pallor.  HEENT: EOMs intact. Oral exam negative for thrush. No cervical lymphadenopathy. CVS: S1S2, regular LUNGS: Bilaterally diminished breath sounds overall,  occasional rhonchi. ABDOMEN: Soft, nontender.    NEURO: grossly nonfocal, cranial nerves are intact.   EXTREMITIES: No pedal edema. Has 2 cm pressure skin ulcer over the left lateral hip with slough covering, no purulent discharge or bleeding.  LAB RESULTS:    Component Value Date/Time   NA 141 10/28/2014 0852   NA 135 05/12/2014 1343   K 3.4* 10/28/2014 0852   K 3.3* 05/12/2014 1343   CL 104 10/28/2014 0852   CL 101 05/12/2014 1343   CO2 32 10/28/2014 0852   CO2 27 05/12/2014 1343   GLUCOSE 93 10/28/2014 0852   GLUCOSE 77 05/12/2014 1343   BUN 8 10/28/2014 0852   BUN 9 05/12/2014 1343   CREATININE 0.85 10/28/2014 0852   CREATININE 0.89 05/12/2014 1343   CALCIUM 7.9* 10/28/2014 0852   CALCIUM 8.8* 05/12/2014 1343   PROT 5.8* 10/28/2014 0852   PROT 5.9* 02/17/2014 1405   ALBUMIN 3.0* 10/28/2014 0852   ALBUMIN 2.8* 02/17/2014 1405   AST 10* 10/28/2014 0852   AST 9* 02/17/2014 1405   ALT 9* 10/28/2014 0852   ALT 12* 02/17/2014 1405  ALKPHOS 29* 10/28/2014 0852   ALKPHOS 44* 02/17/2014 1405   BILITOT 0.3 10/28/2014 0852   BILITOT 0.2 02/17/2014 1405   GFRNONAA >60 10/28/2014 0852   GFRNONAA >60 05/12/2014 1343   GFRNONAA >60 02/17/2014 1405   GFRAA >60 10/28/2014 0852   GFRAA >60 05/12/2014 1343   GFRAA >60 02/17/2014 1405   Lab Results  Component Value Date   WBC 5.6 10/28/2014   NEUTROABS 3.2 10/28/2014   HGB 10.1* 10/28/2014   HCT 29.9* 10/28/2014   MCV 94.7 10/28/2014   PLT 230 10/28/2014     STUDIES: 09/26/13 - PET scan. IMPRESSION: Multifocal hypermetabolic lytic osseous lesions, as described above, including a dominant 2.8 x 1.8 cm lesion in the left acetabulum/posterior column. This appearance is compatible with suspected multiple myeloma, less likely lytic metastases related to an unknown primary. 7 mm short axis hypermetabolic right paratracheal node, indeterminate, favored to be reactive in the absence of additional lymphadenopathy.  09/26/13 - Hb 8.1, Cr  1.62, Ca 10.3.  HIV Ab, HCV Ab, HBsAg negative. Otherwise serum PSA, AFP, CEA, CA19-9 all unremarkable.  09/30/13 - Free K+L Lt Chains,Qn,S Free Kappa Lt Chains,S  1121.00 mg/L (3.30-19.40) Free Lambda Lt Chains,S  4.00 mg/L   (5.71-26.30) Kappa/Lambda Ratio,S     280.25      (0.26-1.65)  12/05/13 - SIEP with M-spike 1.7 (was 5.3 on 10/17/13), serum IgG 2187 (was 7690) Free Kappa Lt Chains,S   298.82 mg/L  (3.30-19.40) Free Lambda Lt Chains,S    3.68 mg/L  (5.71-26.30) Kappa/Lambda Ratio,S      81.20       (0.26-1.65)  07/21/14 - Free K+L Lt Chains,Qn,S Free Kappa Lt Chains,S  115.73 mg/L (3.30-19.40) Free Lambda Lt Chains,S  6.32 mg/L   (5.71-26.30) Kappa/Lambda Ratio,S     18.31      (0.26-1.65)  10/13/14 - SIEP with M-spike 0.5  (was 0.6 on 07/21/14,  0.8 on 01/16/14).  10/13/14 - Free K+L Lt Chains,Qn,S Free Kappa Lt Chains,S  75.71 mg/L (3.30-19.40) Free Lambda Lt Chains,S  7.48 mg/L   (5.71-26.30) Kappa/Lambda Ratio,S     10.12      (0.26-1.65).   ASSESSMENT / PLAN:   1. Multiple myeloma. Bone marrow biopsy 09/30/13 c/w myeloma, abnormal myeloma FISH panel (Aneuploidy - gain of chromosome 7,9,15,FGFR3/4p16.3, and CCND1/11q13. Loss of MAF/16q23.1), cytogenetics normal 46XY. Presented with multiple PET-positive lytic bone lesions, unintentional weight loss, anemia, renal insufficiency, hypercalcemia, monoclonal paraproteinuria, SPEP pending, elevated kappa and lambda levels. Repeat bone marrow biopsy March 2016 reports significant decrease in plasma cells indicative of good response and excellent partial remission. Got evaluated by [facility] for treatment to be a candidate for transplant given comorbidities and poor nutritional status. -    reviewed labs and explained to patient and family present. Most recent repeat SIEP showed further significant drop in the M-spike indicative of continued response to treatment. Plan is to continue on with Revlimid and Decadron, patient is  agreeable to this. Monitor CBC every 2 weekly. Repeat SIEP and Lambda ratio at 10 weeks. MD follow up at 12 weeks.  2. Bisphosphonate therapy -  continue on Zometa every 6 weekly, monitor calcium and creatinine prior to each dose.  3. Anemia - continue Procrit 40000 units subcutaneous q 2weekly if Hb is <10. 4. Mild pedal edema - better, on Lasix plus potassium supplement as needed. 5. Pain - better controlled at this time. Continue on fentanyl patch 125 mcg per hour q.48 hours, Dilaudid p.r.n.  for breakthrough pain.  6. COPD - on home O2. On Ventolin for dyspnea or wheezing. 7. Peripheral Neuropathy - symptoms daily, continue gabapentin at this time. He is status post Velcade therapy. 8. Left lateral hip pressure skin ulcer - referred to wound center for management. In between visits, patient advised to call or come to ER in case of any progressive symptoms or acute sickness. He is agreeable to this plan.     Leia Alf, MD   10/31/2014 9:58 AM

## 2014-11-05 ENCOUNTER — Encounter: Payer: Medicaid Other | Attending: Surgery | Admitting: Surgery

## 2014-11-05 DIAGNOSIS — J449 Chronic obstructive pulmonary disease, unspecified: Secondary | ICD-10-CM | POA: Insufficient documentation

## 2014-11-05 DIAGNOSIS — E441 Mild protein-calorie malnutrition: Secondary | ICD-10-CM | POA: Insufficient documentation

## 2014-11-05 DIAGNOSIS — C9000 Multiple myeloma not having achieved remission: Secondary | ICD-10-CM | POA: Insufficient documentation

## 2014-11-05 DIAGNOSIS — I10 Essential (primary) hypertension: Secondary | ICD-10-CM | POA: Insufficient documentation

## 2014-11-05 DIAGNOSIS — F17218 Nicotine dependence, cigarettes, with other nicotine-induced disorders: Secondary | ICD-10-CM | POA: Diagnosis not present

## 2014-11-05 DIAGNOSIS — L89223 Pressure ulcer of left hip, stage 3: Secondary | ICD-10-CM | POA: Diagnosis not present

## 2014-11-05 NOTE — Progress Notes (Addendum)
DRU, PRIMEAU ([id number]) Visit Report for 11/05/2014 Allergy List Details Patient Name: [name], [name] Date of Service: 11/05/2014 10:15 AM Medical Record Number: [id number] Patient Account Number: [id number] Date of Birth/Sex: July 02, 1960 (54 y.o. Male) Treating RN: Baruch Gouty, RN, BSN, Velva Harman Primary Care Physician: Sena Hitch Other Clinician: Referring Physician: Sena Hitch Treating Physician/Extender: Frann Rider in Treatment: 0 Allergies Active Allergies no known allergies Allergy Notes Electronic Signature(s) Signed: 11/05/2014 10:35:45 AM By: Regan Lemming BSN, RN Entered By: Regan Lemming on 11/05/2014 10:35:45 Corbo, [name] ([id number]) -------------------------------------------------------------------------------- [place] Details Patient Name: [name] Date of Service: 11/05/2014 10:15 AM Medical Record Number: [id number] Patient Account Number: [id number] Date of Birth/Sex: August 04, 1960 (54 y.o. Male) Treating RN: Baruch Gouty, RN, BSN, Velva Harman Primary Care Physician: Sena Hitch Other Clinician: Referring Physician: Sena Hitch Treating Physician/Extender: Frann Rider in Treatment: 0 Visit Information Patient Arrived: Ambulatory Arrival Time: 10:26 Accompanied By: PARTNER Transfer Assistance: None Patient Identification Verified: Yes Secondary Verification Process Yes Completed: Patient Requires Transmission-Based No Precautions: Patient Has Alerts: No Electronic Signature(s) Signed: 11/05/2014 10:26:56 AM By: Regan Lemming BSN, RN Entered By: Regan Lemming on 11/05/2014 10:26:56 Adams, [name] ([id number]) -------------------------------------------------------------------------------- Clinic Level of Care Assessment Details Patient Name: [name] Date of Service: 11/05/2014 10:15 AM Medical Record Number: [id number] Patient Account Number: [id number] Date of Birth/Sex: [birth year] (53 y.o. Male) Treating RN:  Baruch Gouty, RN, BSN, Velva Harman Primary Care Physician: Sena Hitch Other Clinician: Referring Physician: Sena Hitch Treating Physician/Extender: Frann Rider in Treatment: 0 Clinic Level of Care Assessment Items TOOL 2 Quantity Score []  - Use when only an EandM is performed on the INITIAL visit 0 ASSESSMENTS - Nursing Assessment / Reassessment X - General Physical Exam (combine w/ comprehensive assessment (listed just 1 20 below) when performed on new pt. evals) X - Comprehensive Assessment (HX, ROS, Risk Assessments, Wounds Hx, etc.) 1 25 ASSESSMENTS - Wound and Skin Assessment / Reassessment X - Simple Wound Assessment / Reassessment - one wound 1 5 []  - Complex Wound Assessment / Reassessment - multiple wounds 0 []  - Dermatologic / Skin Assessment (not related to wound area) 0 ASSESSMENTS - Ostomy and/or Continence Assessment and Care []  - Incontinence Assessment and Management 0 []  - Ostomy Care Assessment and Management (repouching, etc.) 0 PROCESS - Coordination of Care X - Simple Patient / Family Education for ongoing care 1 15 []  - Complex (extensive) Patient / Family Education for ongoing care 0 []  - Staff obtains Programmer, systems, Records, Test Results / Process Orders 0 []  - Staff telephones HHA, Nursing Homes / Clarify orders / etc 0 []  - Routine Transfer to another Facility (non-emergent condition) 0 []  - Routine Hospital Admission (non-emergent condition) 0 X - New Admissions / Biomedical engineer / Ordering NPWT, Apligraf, etc. 1 15 []  - Emergency Hospital Admission (emergent condition) 0 []  - Simple Discharge Coordination 0 Fehl, Benford A. ([id number]) []  - Complex (extensive) Discharge Coordination 0 PROCESS - Special Needs []  - Pediatric / Minor Patient Management 0 []  - Isolation Patient Management 0 []  - Hearing / Language / Visual special needs 0 []  - Assessment of Community assistance (transportation, D/C planning, etc.) 0 []  - Additional assistance /  Altered mentation 0 []  - Support Surface(s) Assessment (bed, cushion, seat, etc.) 0 INTERVENTIONS - Wound Cleansing / Measurement X - Wound Imaging (photographs - any number of wounds) 1 5 []  - Wound Tracing (instead of photographs) 0 X - Simple Wound Measurement - one wound 1 5 []  - Complex Wound  Measurement - multiple wounds 0 X - Simple Wound Cleansing - one wound 1 5 []  - Complex Wound Cleansing - multiple wounds 0 INTERVENTIONS - Wound Dressings X - Small Wound Dressing one or multiple wounds 1 10 []  - Medium Wound Dressing one or multiple wounds 0 []  - Large Wound Dressing one or multiple wounds 0 []  - Application of Medications - injection 0 INTERVENTIONS - Miscellaneous []  - External ear exam 0 []  - Specimen Collection (cultures, biopsies, blood, body fluids, etc.) 0 []  - Specimen(s) / Culture(s) sent or taken to Lab for analysis 0 []  - Patient Transfer (multiple staff / Harrel Lemon Lift / Similar devices) 0 []  - Simple Staple / Suture removal (25 or less) 0 []  - Complex Staple / Suture removal (26 or more) 0 Kissel, Yasuo A. ([id number]) []  - Hypo / Hyperglycemic Management (close monitor of Blood Glucose) 0 []  - Ankle / Brachial Index (ABI) - do not check if billed separately 0 Has the patient been seen at the hospital within the last three years: Yes Total Score: 105 Level Of Care: New/Established - Level 3 Electronic Signature(s) Signed: 11/05/2014 10:54:46 AM By: Regan Lemming BSN, RN Entered By: Regan Lemming on 11/05/2014 10:54:45 Arndt, [name] ([id number]) -------------------------------------------------------------------------------- Encounter Discharge Information Details Patient Name: [name] Date of Service: 11/05/2014 10:15 AM Medical Record Number: [id number] Patient Account Number: [id number] Date of Birth/Sex: 20-Dec-1960 (54 y.o. Male) Treating RN: Baruch Gouty, RN, BSN, [place] Primary Care Physician: Sena Hitch Other Clinician: Referring Physician:  Sena Hitch Treating Physician/Extender: Frann Rider in Treatment: 0 Encounter Discharge Information Items Discharge Pain Level: 0 Discharge Condition: Stable Ambulatory Status: Ambulatory Discharge Destination: Home Transportation: Private Auto Accompanied By: partner Schedule Follow-up Appointment: No Medication Reconciliation completed and provided to Patient/Care No Samaiya Awadallah: Provided on Clinical Summary of Care: 11/05/2014 Form Type Recipient Paper Patient RM Electronic Signature(s) Signed: 11/05/2014 3:53:50 PM By: Regan Lemming BSN, RN Previous Signature: 11/05/2014 11:06:05 AM Version By: Sharon Mt Previous Signature: 11/05/2014 10:55:45 AM Version By: Regan Lemming BSN, RN Entered By: Regan Lemming on 11/05/2014 15:53:50 Esselman, [name] ([id number]) -------------------------------------------------------------------------------- Lower Extremity Assessment Details Patient Name: [name] Date of Service: 11/05/2014 10:15 AM Medical Record Number: [id number] Patient Account Number: [id number] Date of Birth/Sex: 02/18/1960 (54 y.o. Male) Treating RN: Baruch Gouty, RN, BSN, [place] Primary Care Physician: Sena Hitch Other Clinician: Referring Physician: Sena Hitch Treating Physician/Extender: Frann Rider in Treatment: 0 Electronic Signature(s) Signed: 11/05/2014 10:35:26 AM By: Regan Lemming BSN, RN Entered By: Regan Lemming on 11/05/2014 10:35:26 Linse, [name] ([id number]) -------------------------------------------------------------------------------- Multi Wound Chart Details Patient Name: [name] Date of Service: 11/05/2014 10:15 AM Medical Record Number: [id number] Patient Account Number: [id number] Date of Birth/Sex: 09-28-1960 (54 y.o. 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] [N/A:N/A] Comorbid History: [1:Chronic Obstructive Pulmonary Disease (COPD), Arrhythmia, Hypertension, Received Radiation] [N/A:N/A] Date Acquired: [1:10/19/2014] [N/A:N/A] Weeks of Treatment: [1:0] [N/A:N/A] Wound Status: [1:Open] [N/A:N/A] Measurements L x W x D 2.2x2x0.1 [N/A:N/A] (cm) Area (cm) : [1:3.456] [N/A:N/A] Volume (cm) : [1:0.346] [N/A:N/A] % Reduction in Area: [1:0.00%] [N/A:N/A] % Reduction in Volume: 0.00% [N/A:N/A] Classification: [1:Partial Thickness] [N/A:N/A] Exudate Amount: [1:Small] [  N/A:N/A] Exudate Type: [1:Serosanguineous] [N/A:N/A] Exudate Color: [1:red, brown] [N/A:N/A] Wound Margin: [1:Distinct, outline attached] [N/A:N/A] Granulation Amount: [1:None Present (0%)] [N/A:N/A] Necrotic Amount: [1:Large (67-100%)] [N/A:N/A] Exposed Structures: [1:Fascia: No Fat: No Tendon: No Muscle: No Joint: No] [N/A:N/A] Bone: No Limited to Skin Breakdown Epithelialization: None N/A N/A Periwound Skin Texture: Edema: No N/A N/A Excoriation: No Induration: No Callus: No Crepitus: No Fluctuance: No Friable: No Rash: No Scarring: No Periwound Skin Moist: Yes N/A N/A Moisture: Maceration: No Dry/Scaly: No Periwound Skin Color: Erythema: Yes N/A N/A Atrophie Blanche: No Cyanosis: No Ecchymosis: No Hemosiderin Staining: No Mottled: No Pallor: No Rubor: No Erythema Location: Circumferential N/A N/A Temperature: No Abnormality N/A N/A Tenderness on Yes N/A N/A Palpation: Wound Preparation: Ulcer Cleansing: N/A N/A Rinsed/Irrigated with Saline Topical Anesthetic Applied: Other:  lidocaine 4% Treatment Notes Electronic Signature(s) Signed: 11/05/2014 10:49:26 AM By: Regan Lemming BSN, RN Entered By: Regan Lemming on 11/05/2014 10:49:25 Gassman, [name] ([id number]) -------------------------------------------------------------------------------- [place] Details Patient Name: [name] Date of Service: 11/05/2014 10:15 AM Medical Record Number: [id number] Patient Account Number: [id number] Date of Birth/Sex: 01/18/1961 (54 y.o. Male) Treating RN: Baruch Gouty, RN, BSN, Velva Harman Primary Care Physician: Sena Hitch Other Clinician: Referring Physician: Sena Hitch Treating Physician/Extender: Frann Rider in Treatment: 0 Active Inactive Electronic Signature(s) Signed: 11/05/2014 10:49:13 AM By: Regan Lemming BSN, RN Entered By: Regan Lemming on 11/05/2014 10:49:13 Geisler, [name] ([id number]) -------------------------------------------------------------------------------- Pain Assessment Details Patient Name: [name] Date of Service: 11/05/2014 10:15 AM Medical Record Number: [id number] Patient Account Number: [id number] Date of Birth/Sex: 1960-03-24 (54 y.o. Male) Treating RN: Baruch Gouty, RN, BSN, [place] Primary Care Physician: Sena Hitch Other Clinician: Referring Physician: Sena Hitch Treating Physician/Extender: Frann Rider in Treatment: 0 Active Problems Location of Pain Severity and Description of Pain Patient Has Paino No Site Locations Pain Management and Medication Current Pain Management: Electronic Signature(s) Signed: 11/05/2014 10:27:04 AM By: Regan Lemming BSN, RN Entered By: Regan Lemming on 11/05/2014 10:27:04 [name] ([id number]) -------------------------------------------------------------------------------- Patient/Caregiver Education Details Patient Name: [name] Date of Service: 11/05/2014 10:15 AM Medical Record Number: [id number] Patient Account Number: [id number] Date  of Birth/Gender: 1960/01/24 (54 y.o. Male) Treating RN: Baruch Gouty, RN, BSN, [place] Primary Care Physician: Sena Hitch Other Clinician: Referring Physician: Sena Hitch Treating Physician/Extender: Frann Rider in Treatment: 0 Education Assessment Education Provided To: Patient Education Topics Provided Wound/Skin Impairment: Methods: Explain/Verbal Responses: State content correctly Electronic Signature(s) Signed: 11/05/2014 3:54:03 PM By: Regan Lemming BSN, RN Entered By: Regan Lemming on 11/05/2014 15:54:03 Reisig, [name] ([id number]) -------------------------------------------------------------------------------- Wound Assessment Details Patient Name: [name] Date of Service: 11/05/2014 10:15 AM Medical Record Number: [id number] Patient Account Number: [id number] Date of Birth/Sex: 1960-12-28 (54 y.o. 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 Wound Open Wounding Event: Gradually Appeared Status: Date Acquired: 10/19/2014 Comorbid Chronic Obstructive Pulmonary Weeks Of Treatment: 0 History: Disease (COPD), Arrhythmia, Clustered Wound: No Hypertension, Received Radiation Photos Photo Uploaded By: Regan Lemming on 11/05/2014 16:12:47 Wound Measurements Length: (cm) 2.2 Width: (cm) 2 Depth: (cm) 0.1 Area: (cm) 3.456 Volume: (cm) 0.346 % Reduction in Area: 0% % Reduction in Volume: 0% Epithelialization: None Tunneling: No Undermining: No Wound Description Classification: Partial Thickness Wound Margin: Distinct, outline attached Exudate Amount: Small Exudate Type: Serosanguineous Exudate Color: red, brown Foul Odor After  Cleansing: No Wound Bed Granulation Amount: None Present  (0%) Exposed Structure Necrotic Amount: Large (67-100%) Fascia Exposed: No Necrotic Quality: Adherent Slough Fat Layer Exposed: No Tendon Exposed: No Hillman, Troi A. ([id number]) Muscle Exposed: No Joint Exposed: No Bone Exposed: No Limited to Skin Breakdown Periwound Skin Texture Texture Color No Abnormalities Noted: No No Abnormalities Noted: No Callus: No Atrophie Blanche: No Crepitus: No Cyanosis: No Excoriation: No Ecchymosis: No Fluctuance: No Erythema: Yes Friable: No Erythema Location: Circumferential Induration: No Hemosiderin Staining: No Localized Edema: No Mottled: No Rash: No Pallor: No Scarring: No Rubor: No Moisture Temperature / Pain No Abnormalities Noted: No Temperature: No Abnormality Dry / Scaly: No Tenderness on Palpation: Yes Maceration: No Moist: Yes Wound Preparation Ulcer Cleansing: Rinsed/Irrigated with Saline Topical Anesthetic Applied: Other: lidocaine 4%, Treatment Notes Wound #1 (Left Ischial Tuberosity) 1. Cleansed with: Clean wound with Normal Saline 4. Dressing Applied: Santyl Ointment 5. Secondary Dressing Applied Bordered Foam Dressing Electronic Signature(s) Signed: 11/05/2014 10:42:42 AM By: Regan Lemming BSN, RN Entered By: Regan Lemming on 11/05/2014 10:42:42 Balicki, [name] ([id number]) -------------------------------------------------------------------------------- Vitals Details Patient Name: [name] Date of Service: 11/05/2014 10:15 AM Medical Record Number: [id number] Patient Account Number: [id number] Date of Birth/Sex: September 22, 1960 (54 y.o. Male) Treating RN: Baruch Gouty, RN, BSN, [place] Primary Care Physician: Sena Hitch Other Clinician: Referring Physician: Sena Hitch Treating Physician/Extender: Frann Rider in Treatment: 0 Vital Signs Time Taken: 10:34 Temperature (F): 97.9 Height (in): 73 Pulse (bpm): 81 Source: Stated Respiratory Rate (breaths/min): 18 Weight (lbs): 132 Blood  Pressure (mmHg): 97/68 Source: Measured Reference Range: 80 - 120 mg / dl Body Mass Index (BMI): 17.4 Electronic Signature(s) Signed: 11/05/2014 10:37:09 AM By: Regan Lemming BSN, RN Previous Signature: 11/05/2014 10:35:03 AM Version By: Regan Lemming BSN, RN Entered By: Regan Lemming on 11/05/2014 10:37:09

## 2014-11-05 NOTE — Progress Notes (Signed)
ORENTHAL, DEBSKI ([id number]) Visit Report for 11/05/2014 Chief Complaint Document Details Patient Name: [name], [name] 11/05/2014 10:15 Date of Service: AM Medical Record [id number] Number: Patient Account Number: [id number] October 14, 1960 (54 y.o. Treating RN: Baruch Gouty, RN, BSN, Velva Harman Date of Birth/Sex: Male) Other Clinician: Primary Care Physician: Concepcion Living Referring Physician: Sena Hitch Physician/Extender: Weeks in Treatment: 0 Information Obtained from: Patient Chief Complaint Patient is at the clinic for treatment of an open pressure ulcer. 55 year old gentleman with a ulcerated area on his left hip she's had for 3 weeks. Electronic Signature(s) Signed: 11/05/2014 11:00:06 AM By: Christin Fudge MD, FACS Entered By: Christin Fudge on 11/05/2014 11:00:05 Tetreault, [name] ([id number]) -------------------------------------------------------------------------------- HPI Details Patient Name: [name] Form A. 11/05/2014 10:15 Date of Service: AM Medical Record [id number] Number: Patient Account Number: [id number] 22-May-1960 (54 y.o. Treating RN: Baruch Gouty, RN, BSN, Velva Harman Date of Birth/Sex: Male) Other Clinician: Primary Care Physician: Concepcion Living Referring Physician: Sena Hitch Physician/Extender: Weeks in Treatment: 0 History of Present Illness Location: left hip area Quality: Patient reports experiencing a dull pain to affected area(s). Severity: Patient states wound are getting worse. Duration: Patient has had the wound for < 3 weeks prior to presenting for treatment Timing: Pain in wound is Intermittent (comes and goes Context: The wound appeared gradually over time Modifying Factors: Other treatment(s) tried include: local dressing changes with antibiotic cream Associated Signs and Symptoms: Patient reports having difficulty standing for long periods. HPI Description: The patient is under treatment with  medical oncology Dr. Ma Hillock sees him, for multiple myeloma, anemia, COPD, peripheral neuropathy, pedal edema and has been referred to [place] for left lateral hip pressure ulcer. his past medical history significant for coronary artery disease, dilated cardiomyopathy with an EF of 20% and has had her AICD placement, kidney stones, asthma, COPD, polysubstance abuse, anxiety. continues to smoke a pack of cigarettes a day, history of recreational drug usage and used to take IV drugs in the late 1980s. He was in bed for a long while due to an associated illness and lay on his left side for prolonged periods of time. He has been ambulatory otherwise and has a fairly poor nutrition. Electronic Signature(s) Signed: 11/05/2014 11:01:45 AM By: Christin Fudge MD, FACS Previous Signature: 11/05/2014 10:40:29 AM Version By: Christin Fudge MD, FACS Entered By: Christin Fudge on 11/05/2014 11:01:44 Sealy, [name] ([id number]) -------------------------------------------------------------------------------- Physical Exam Details Patient Name: [name], UPSHAW A. 11/05/2014 10:15 Date of Service: AM Medical Record [id number] Number: Patient Account Number: [id number] 09/22/1960 (54 y.o. Treating RN: Baruch Gouty, RN, BSN, Velva Harman Date of Birth/Sex: Male) Other Clinician: Primary Care Physician: Concepcion Living Referring Physician: Sena Hitch Physician/Extender: Weeks in Treatment: 0 Constitutional . Pulse regular. Respirations normal and unlabored. Afebrile. . Eyes Nonicteric. Reactive to light. Ears, Nose, Mouth, and Throat Lips, teeth, and gums WNL.Marland Kitchen Moist mucosa without lesions . Neck supple and nontender. No palpable supraclavicular or cervical adenopathy. Normal sized without goiter. Respiratory WNL. No retractions.. Cardiovascular Pedal Pulses WNL. No clubbing, cyanosis or edema. Gastrointestinal (GI) Abdomen without masses or tenderness.. No liver or spleen enlargement or  tenderness.. Lymphatic No adneopathy. No adenopathy. No adenopathy. Musculoskeletal Adexa without tenderness or enlargement.. Digits and nails w/o clubbing, cyanosis, infection, petechiae, ischemia, or inflammatory conditions.. Integumentary (Hair, Skin) No suspicious lesions. No crepitus or fluctuance. No peri-wound warmth or erythema. No masses.Marland Kitchen Psychiatric Judgement and insight Intact.. No evidence of depression, anxiety, or agitation.. Notes he has a stage III pressure injury to the  left hip with slough at the base and mild surrounding erythema. No purulence or no surrounding cellulitis. Electronic Signature(s) Signed: 11/05/2014 11:02:22 AM By: Christin Fudge MD, FACS Entered By: Christin Fudge on 11/05/2014 11:02:21 [name] ([id number]) -------------------------------------------------------------------------------- Physician Orders Details Patient Name: [name] Form A. 11/05/2014 10:15 Date of Service: AM Medical Record [id number] Number: Patient Account Number: [id number] [birth year] (54 y.o. Treating RN: Baruch Gouty, RN, BSN, Velva Harman Date of Birth/Sex: Male) Other Clinician: Primary Care Physician: Concepcion Living Referring Physician: Sena Hitch Physician/Extender: Suella [place] in Treatment: 0 Verbal / Phone Orders: Yes Clinician: Afful, RN, BSN, Rita Read Back and Verified: Yes Diagnosis Coding Wound Cleansing Wound #1 Left Ischial Tuberosity o Clean wound with Normal Saline. Anesthetic Wound #1 Left Ischial Tuberosity o Topical Lidocaine 4% cream applied to wound bed prior to debridement Skin Barriers/Peri-Wound Care Wound #1 Left Ischial Tuberosity o Barrier cream Primary Wound Dressing Wound #1 Left Ischial Tuberosity o Santyl Ointment Secondary Dressing Wound #1 Left Ischial Tuberosity o Boardered Foam Dressing Dressing Change Frequency Wound #1 Left Ischial Tuberosity o Change dressing every day. Follow-up  Appointments Wound #1 Left Ischial Tuberosity o Return Appointment in 1 week. Off-Loading Wound #1 Left Ischial Tuberosity o Turn and reposition every 2 hours Sopp, Kennis A. ([id number]) Additional Orders / Instructions Wound #1 Left Ischial Tuberosity o Stop Smoking o Increase protein intake. o Other: - obtain vitamins;zinc, vitamin C Electronic Signature(s) Signed: 11/05/2014 10:56:57 AM By: Regan Lemming BSN, RN Signed: 11/05/2014 3:40:39 PM By: Christin Fudge MD, FACS Previous Signature: 11/05/2014 10:53:51 AM Version By: Regan Lemming BSN, RN Entered By: Regan Lemming on 11/05/2014 10:56:56 Seibold, [name] ([id number]) -------------------------------------------------------------------------------- Problem List Details Patient Name: BRANDO, TAVES A. 11/05/2014 10:15 Date of Service: AM Medical Record [id number] Number: Patient Account Number: [id number] 05-03-1960 (54 y.o. Treating RN: Baruch Gouty, RN, BSN, Velva Harman Date of Birth/Sex: Male) Other Clinician: Primary Care Physician: Concepcion Living Referring Physician: Sena Hitch Physician/Extender: Weeks in Treatment: 0 Active Problems ICD-10 Encounter Code Description Active Date Diagnosis L89.223 Pressure ulcer of left hip, stage 3 11/05/2014 Yes C90.00 Multiple myeloma not having achieved remission 11/05/2014 Yes F17.218 Nicotine dependence, cigarettes, with other nicotine- 11/05/2014 Yes induced disorders E44.1 Mild protein-calorie malnutrition 11/05/2014 Yes Inactive Problems Resolved Problems Electronic Signature(s) Signed: 11/05/2014 10:59:31 AM By: Christin Fudge MD, FACS Entered By: Christin Fudge on 11/05/2014 10:59:30 Diehl, [name] ([id number]) -------------------------------------------------------------------------------- Progress Note Details Patient Name: [name] Form A. 11/05/2014 10:15 Date of Service: AM Medical Record [id number] Number: Patient Account Number:  [id number] October 26, 1960 (54 y.o. Treating RN: Baruch Gouty, RN, BSN, Velva Harman Date of Birth/Sex: Male) Other Clinician: Primary Care Physician: Concepcion Living Referring Physician: Sena Hitch Physician/Extender: Weeks in Treatment: 0 Subjective Chief Complaint Information obtained from Patient Patient is at the clinic for treatment of an open pressure ulcer. 54 year old gentleman with a ulcerated area on his left hip she's had for 3 weeks. History of Present Illness (HPI) The following HPI elements were documented for the patient's wound: Location: left hip area Quality: Patient reports experiencing a dull pain to affected area(s). Severity: Patient states wound are getting worse. Duration: Patient has had the wound for < 3 weeks prior to presenting for treatment Timing: Pain in wound is Intermittent (comes and goes Context: The wound appeared gradually over time Modifying Factors: Other treatment(s) tried include: local dressing changes with antibiotic cream Associated Signs and Symptoms: Patient reports having difficulty standing for long periods. The patient is under treatment with medical oncology Dr.  Ma Hillock sees him, for multiple myeloma, anemia, COPD, peripheral neuropathy, pedal edema and has been referred to [place] for left lateral hip pressure ulcer. his past medical history significant for coronary artery disease, dilated cardiomyopathy with an EF of 20% and has had her AICD placement, kidney stones, asthma, COPD, polysubstance abuse, anxiety. continues to smoke a pack of cigarettes a day, history of recreational drug usage and used to take IV drugs in the late 1980s. He was in bed for a long while due to an associated illness and lay on his left side for prolonged periods of time. He has been ambulatory otherwise and has a fairly poor nutrition. Wound History Patient presents with 1 open wound that has been present for approximately 3weeks. Patient has  been treating wound in the following manner: dry gauze, betadine. Laboratory tests have not been performed in the last month. Patient reportedly has tested positive for an antibiotic resistant organism. Patient reportedly has not tested positive for osteomyelitis. Patient reportedly has not had testing performed to evaluate circulation in the legs. Patient History Information obtained from Patient. POPE, BRUNTY ([id number]) Allergies no known allergies Family History Cancer - Mother, Mother, Heart Disease - Mother, Hypertension - Mother, Lung Disease - Mother, Stroke - Siblings, Thyroid Problems - Siblings, No family history of Diabetes, Hereditary Spherocytosis, Kidney Disease, Seizures, Tuberculosis. Social History Current every day smoker, Marital Status - Married, Alcohol Use - Rarely - quit, Drug Use - No History, Caffeine Use - Daily. Medical History Eyes Denies history of Cataracts, Glaucoma, Optic Neuritis Ear/Nose/Mouth/Throat Denies history of Chronic sinus problems/congestion, Middle ear problems Hematologic/Lymphatic Denies history of Anemia, Hemophilia, Human Immunodeficiency Virus, Lymphedema, Sickle Cell Disease Respiratory Patient has history of Chronic Obstructive Pulmonary Disease (COPD) Denies history of Aspiration, Asthma, Pneumothorax, Sleep Apnea, Tuberculosis Cardiovascular Patient has history of Arrhythmia, Hypertension Gastrointestinal Denies history of Cirrhosis , Colitis, Crohn s, Hepatitis A, Hepatitis B, Hepatitis C Endocrine Denies history of Type I Diabetes, Type II Diabetes Genitourinary Denies history of End Stage Renal Disease Immunological Denies history of Lupus Erythematosus, Raynaud s, Scleroderma Integumentary (Skin) Denies history of History of Burn, History of pressure wounds Musculoskeletal Denies history of Gout, Rheumatoid Arthritis, Osteoarthritis, Osteomyelitis Neurologic Denies history of Dementia, Neuropathy, Quadriplegia,  Paraplegia, Seizure Disorder Oncologic Patient has history of Received Radiation - by pill Psychiatric Denies history of Anorexia/bulimia, Confinement Anxiety Medical And Surgical History Notes Cardiovascular pace maker, defibillator Review of Systems (ROS) Constitutional Symptoms (General Health) Backlund, Jermanie A. ([id number]) The patient has no complaints or symptoms. Eyes The patient has no complaints or symptoms. Ear/Nose/Mouth/Throat The patient has no complaints or symptoms. Hematologic/Lymphatic Complains or has symptoms of Bleeding / Clotting Disorders. Respiratory The patient has no complaints or symptoms. Cardiovascular The patient has no complaints or symptoms. Gastrointestinal The patient has no complaints or symptoms. Endocrine The patient has no complaints or symptoms. Genitourinary The patient has no complaints or symptoms. Immunological The patient has no complaints or symptoms. Integumentary (Skin) Complains or has symptoms of Wounds. Musculoskeletal The patient has no complaints or symptoms. Neurologic The patient has no complaints or symptoms. Psychiatric The patient has no complaints or symptoms. Medications carvedilol 25 mg tablet oral 1 1 tablet oral fentanyl 12 mcg/hr transdermal patch transdermal 1 1 patch 72 hour transdermal fentanyl 25 mcg/hr transdermal patch transdermal 1 1 patch 72 hour transdermal hydromorphone 2 mg tablet oral tablet oral gabapentin 100 mg capsule oral capsule oral promethazine 25 mg tablet oral 1 1 tablet oral lisinopril 2.5 mg tablet  oral 1 1 tablet oral ProAir HFA 90 mcg/actuation aerosol inhaler inhalation HFA aerosol inhaler inhalation Advair Diskus 100 mcg-50 mcg/dose powder for inhalation inhalation blister with device inhalation dexamethasone 4 mg tablet oral 1 1 tablet oral Advil 200 mg tablet oral 2 2 tablet oral Objective Catoe, Tyquan A. ([id number]) Constitutional Pulse regular. Respirations normal and  unlabored. Afebrile. Vitals Time Taken: 10:34 AM, Height: 73 in, Source: Stated, Weight: 132 lbs, Source: Measured, BMI: 17.4, Temperature: 97.9 F, Pulse: 81 bpm, Respiratory Rate: 18 breaths/min, Blood Pressure: 97/68 mmHg. Eyes Nonicteric. Reactive to light. Ears, Nose, Mouth, and Throat Lips, teeth, and gums WNL.Marland Kitchen Moist mucosa without lesions . Neck supple and nontender. No palpable supraclavicular or cervical adenopathy. Normal sized without goiter. Respiratory WNL. No retractions.. Cardiovascular Pedal Pulses WNL. No clubbing, cyanosis or edema. Gastrointestinal (GI) Abdomen without masses or tenderness.. No liver or spleen enlargement or tenderness.. Lymphatic No adneopathy. No adenopathy. No adenopathy. Musculoskeletal Adexa without tenderness or enlargement.. Digits and nails w/o clubbing, cyanosis, infection, petechiae, ischemia, or inflammatory conditions.Marland Kitchen Psychiatric Judgement and insight Intact.. No evidence of depression, anxiety, or agitation.. General Notes: he has a stage III pressure injury to the left hip with slough at the base and mild surrounding erythema. No purulence or no surrounding cellulitis. Integumentary (Hair, Skin) No suspicious lesions. No crepitus or fluctuance. No peri-wound warmth or erythema. No masses.. Wound #1 status is Open. Original cause of wound was Gradually Appeared. The wound is located on the Left Ischial Tuberosity. The wound measures 2.2cm length x 2cm width x 0.1cm depth; 3.456cm^2 area and 0.346cm^3 volume. The wound is limited to skin breakdown. There is no tunneling or undermining noted. There is a small amount of serosanguineous drainage noted. The wound margin is distinct with the outline attached to the wound base. There is no granulation within the wound bed. There is a large (67-100%) amount of necrotic tissue within the wound bed including Adherent Slough. The periwound skin appearance exhibited: Moist, Erythema. The  periwound skin appearance did not exhibit: Callus, Crepitus, Excoriation, Fluctuance, Friable, Induration, Localized Edema, Rash, Scarring, Dry/Scaly, Maceration, Atrophie Blanche, Rahrig, Aster A. ([id number]) Cyanosis, Ecchymosis, Hemosiderin Staining, Mottled, Pallor, Rubor. The surrounding wound skin color is noted with erythema which is circumferential. Periwound temperature was noted as No Abnormality. The periwound has tenderness on palpation. Assessment Active Problems ICD-10 L89.223 - Pressure ulcer of left hip, stage 3 C90.00 - Multiple myeloma not having achieved remission F17.218 - Nicotine dependence, cigarettes, with other nicotine-induced disorders E44.1 - Mild protein-calorie malnutrition I have recommended Santyl ointment and a bordered foam dressing and besides this have recommended 1. completely giving up smoking and have spent some time discussing the risks benefits alternatives and all the possible methods of giving up smoking. 2. Increase nutritional intake with probable proteins and multivitamins 3. Offloading and the methods this can be done and proper proper posture 4. Regular visits to the wound center for appropriate monitoring and debridement. He and his partner have had all questions answered and say they will be compliant. Plan Wound Cleansing: Wound #1 Left Ischial Tuberosity: Clean wound with Normal Saline. Anesthetic: Wound #1 Left Ischial Tuberosity: Topical Lidocaine 4% cream applied to wound bed prior to debridement Skin Barriers/Peri-Wound Care: Wound #1 Left Ischial Tuberosity: Barrier cream Primary Wound Dressing: Wound #1 Left Ischial Tuberosity: Santyl Ointment Secondary Dressing: Wound #1 Left Ischial Tuberosity: Boardered Foam Dressing Mohl, Ivy A. ([id number]) Dressing Change Frequency: Wound #1 Left Ischial Tuberosity: Change dressing every day. Follow-up Appointments: Wound #1  Left Ischial Tuberosity: Return Appointment in 1  week. Off-Loading: Wound #1 Left Ischial Tuberosity: Turn and reposition every 2 hours Additional Orders / Instructions: Wound #1 Left Ischial Tuberosity: Stop Smoking Increase protein intake. Other: - obtain vitamins;zinc, vitamin C I have recommended Santyl ointment and a bordered foam dressing and besides this have recommended 1. completely giving up smoking and have spent some time discussing the risks benefits alternatives and all the possible methods of giving up smoking. 2. Increase nutritional intake with probable proteins and multivitamins 3. Offloading and the methods this can be done and proper proper posture 4. Regular visits to the wound center for appropriate monitoring and debridement. He and his partner have had all questions answered and say they will be compliant. Electronic Signature(s) Signed: 11/05/2014 11:04:19 AM By: Christin Fudge MD, FACS Entered By: Christin Fudge on 11/05/2014 11:04:19 [name] ([id number]) -------------------------------------------------------------------------------- ROS/PFSH Details Patient Name: [name] Form A. 11/05/2014 10:15 Date of Service: AM Medical Record [id number] Number: Patient Account Number: [id number] 19-Feb-1960 (54 y.o. Treating RN: Afful, RN, BSN, Velva Harman Date of Birth/Sex: Male) Other Clinician: Primary Care Physician: Cora Collum, Tayron Hunnell Referring Physician: Sena Hitch Physician/Extender: Weeks in Treatment: 0 Information Obtained From Patient Wound History Do you currently have one or more open woundso Yes How many open wounds do you currently haveo 1 Approximately how long have you had your woundso 3weeks How have you been treating your wound(s) until nowo dry gauze, betadine Has your wound(s) ever healed and then re-openedo No Have you had any lab work done in the past montho No Have you tested positive for osteomyelitis (bone infection)o No Have you had any tests for  circulation on your legso No Hematologic/Lymphatic Complaints and Symptoms: Positive for: Bleeding / Clotting Disorders Medical History: Negative for: Anemia; Hemophilia; Human Immunodeficiency Virus; Lymphedema; Sickle Cell Disease Integumentary (Skin) Complaints and Symptoms: Positive for: Wounds Medical History: Negative for: History of Burn; History of pressure wounds Constitutional Symptoms (General Health) Complaints and Symptoms: No Complaints or Symptoms Eyes Complaints and Symptoms: No Complaints or Symptoms Medical History: Mercier, Tyrin A. ([id number]) Negative for: Cataracts; Glaucoma; Optic Neuritis Ear/Nose/Mouth/Throat Complaints and Symptoms: No Complaints or Symptoms Medical History: Negative for: Chronic sinus problems/congestion; Middle ear problems Respiratory Complaints and Symptoms: No Complaints or Symptoms Medical History: Positive for: Chronic Obstructive Pulmonary Disease (COPD) Negative for: Aspiration; Asthma; Pneumothorax; Sleep Apnea; Tuberculosis Cardiovascular Complaints and Symptoms: No Complaints or Symptoms Medical History: Positive for: Arrhythmia; Hypertension Past Medical History Notes: Psychologist, forensic, defibillator Gastrointestinal Complaints and Symptoms: No Complaints or Symptoms Medical History: Negative for: Cirrhosis ; Colitis; Crohnos; Hepatitis A; Hepatitis B; Hepatitis C Endocrine Complaints and Symptoms: No Complaints or Symptoms Medical History: Negative for: Type I Diabetes; Type II Diabetes Genitourinary Complaints and Symptoms: No Complaints or Symptoms Medical History: Gault, Flynn A. ([id number]) Negative for: End Stage Renal Disease Immunological Complaints and Symptoms: No Complaints or Symptoms Medical History: Negative for: Lupus Erythematosus; Raynaudos; Scleroderma Musculoskeletal Complaints and Symptoms: No Complaints or Symptoms Medical History: Negative for: Gout; Rheumatoid Arthritis;  Osteoarthritis; Osteomyelitis Neurologic Complaints and Symptoms: No Complaints or Symptoms Medical History: Negative for: Dementia; Neuropathy; Quadriplegia; Paraplegia; Seizure Disorder Oncologic Medical History: Positive for: Received Radiation - by pill Psychiatric Complaints and Symptoms: No Complaints or Symptoms Medical History: Negative for: Anorexia/bulimia; Confinement Anxiety Family and Social History Cancer: Yes - Mother, Mother; Diabetes: No; Heart Disease: Yes - Mother; Hereditary Spherocytosis: No; Hypertension: Yes - Mother; Kidney Disease: No; Lung Disease: Yes - Mother; Seizures: No;  Stroke: Yes - Siblings; Thyroid Problems: Yes - Siblings; Tuberculosis: No; Current every day smoker; Marital Status - Married; Alcohol Use: Rarely - quit; Drug Use: No History; Caffeine Use: Daily; Financial Concerns: No; Food, Clothing or Shelter Needs: No; Support System Lacking: No; Advanced Directives: No; Patient does not want information on Advanced Directives; Living Will: No Physician Affirmation I have reviewed and agree with the above information. Electronic Signature(s) SANTONIO, SPEAKMAN ([id number]) Signed: 11/05/2014 10:40:57 AM By: Christin Fudge MD, FACS Signed: 11/05/2014 4:15:02 PM By: Regan Lemming BSN, RN Previous Signature: 11/05/2014 10:33:21 AM Version By: Regan Lemming BSN, RN Entered By: Christin Fudge on 11/05/2014 10:40:57 Woodrow, [name] ([id number]) -------------------------------------------------------------------------------- SuperBill Details Patient Name: [name] Date of Service: 11/05/2014 Medical Record Number: [id number] Patient Account Number: [id number] Date of Birth/Sex: 1960-05-01 (54 y.o. Male) Treating RN: Baruch Gouty, RN, BSN, Byng Primary Care Physician: Sena Hitch Other Clinician: Referring Physician: Sena Hitch Treating Physician/Extender: Frann Rider in Treatment: 0 Diagnosis Coding ICD-10 Codes Code  Description [id number] Pressure ulcer of left hip, stage 3 C90.00 Multiple myeloma not having achieved remission F17.218 Nicotine dependence, cigarettes, with other nicotine-induced disorders E44.1 Mild protein-calorie malnutrition Facility Procedures CPT4 Code: [id number] Description: 99213 - WOUND CARE VISIT-LEV 3 EST PT Modifier: Quantity: 1 Physician Procedures CPT4 Code Description: 6803212 24825 - WC PHYS LEVEL 4 - NEW PT ICD-10 Description Diagnosis L89.223 Pressure ulcer of left hip, stage 3 C90.00 Multiple myeloma not having achieved remission F17.218 Nicotine dependence, cigarettes, with other nicot E44.1  Mild protein-calorie malnutrition Modifier: ine-induced di Quantity: 1 sorders Electronic Signature(s) Signed: 11/05/2014 11:04:37 AM By: Christin Fudge MD, FACS Entered By: Christin Fudge on 11/05/2014 11:04:37

## 2014-11-05 NOTE — Progress Notes (Signed)
PONCIANO, SHEALY ([id number]) Visit Report for 11/05/2014 Abuse/Suicide Risk Screen Details Patient Name: [name], [name] 11/05/2014 10:15 Date of Service: AM Medical Record [id number] Number: Patient Account Number: [id number] Dec 10, 1960 (54 y.o. Treating RN: Baruch Gouty, RN, BSN, Velva Harman Date of Birth/Sex: Male) Other Clinician: Primary Care Physician: Cora Collum, Errol Referring Physician: Sena Hitch Physician/Extender: Weeks in Treatment: 0 Abuse/Suicide Risk Screen Items Answer ABUSE/SUICIDE RISK SCREEN: Has anyone close to you tried to hurt or harm you recentlyo No Do you feel uncomfortable with anyone in your familyo No Has anyone forced you do things that you didnot want to doo No Do you have any thoughts of harming yourselfo No Patient displays signs or symptoms of abuse and/or neglect. No Electronic Signature(s) Signed: 11/05/2014 10:33:29 AM By: Regan Lemming BSN, RN Entered By: Regan Lemming on 11/05/2014 10:33:29 [name] ([id number]) -------------------------------------------------------------------------------- Activities of Daily Living Details Patient Name: [name], [name] 11/05/2014 10:15 Date of Service: AM Medical Record [id number] Number: Patient Account Number: [id number] July 28, 1960 (54 y.o. Treating RN: Baruch Gouty, RN, BSN, Velva Harman Date of Birth/Sex: Male) Other Clinician: Primary Care Physician: Concepcion Living Referring Physician: Sena Hitch Physician/Extender: Weeks in Treatment: 0 Activities of Daily Living Items Answer Activities of Daily Living (Please select one for each item) Drive Automobile Need Assistance Take Medications Need Assistance Use Telephone Completely Able Care for Appearance Completely Able Use Toilet Completely Able Bath / Shower Completely Able Dress Self Completely Able Feed Self Completely Able Walk Completely Able Get In / Out Bed Completely Able Housework  Completely Able Prepare Meals Completely [place] for Self Completely Able Electronic Signature(s) Signed: 11/05/2014 10:33:57 AM By: Regan Lemming BSN, RN Entered By: Regan Lemming on 11/05/2014 10:33:57 Mancias, [name] ([id number]) -------------------------------------------------------------------------------- Education Assessment Details Patient Name: [name] Form A. 11/05/2014 10:15 Date of Service: AM Medical Record [id number] Number: Patient Account Number: [id number] 07-06-1960 (54 y.o. Treating RN: Baruch Gouty, RN, BSN, Velva Harman Date of Birth/Sex: Male) Other Clinician: Primary Care Physician: Concepcion Living Referring Physician: Sena Hitch Physician/Extender: Suella [place] in Treatment: 0 Primary Learner Assessed: Patient Learning Preferences/Education Level/Primary Language Learning Preference: Explanation Highest Education Level: High School Preferred Language: English Cognitive Barrier Assessment/Beliefs Language Barrier: No Physical Barrier Assessment Impaired Vision: No Impaired Hearing: No Decreased Hand dexterity: No Knowledge/Comprehension Assessment Knowledge Level: High Comprehension Level: High Ability to understand written High instructions: Ability to understand verbal High instructions: Motivation Assessment Anxiety Level: Calm Cooperation: Cooperative Education Importance: Acknowledges Need Interest in Health Problems: Asks Questions Perception: Coherent Willingness to Engage in Self- High Management Activities: Readiness to Engage in Self- High Management Activities: Electronic Signature(s) Signed: 11/05/2014 10:34:17 AM By: Regan Lemming BSN, RN Entered By: Regan Lemming on 11/05/2014 10:34:17 Fundora, [name] ([id number]) [name] ([id number]) -------------------------------------------------------------------------------- Fall Risk Assessment Details Patient Name: [name] Form A.  11/05/2014 10:15 Date of Service: AM Medical Record [id number] Number: Patient Account Number: [id number] [birth year] (54 y.o. Treating RN: Baruch Gouty, RN, BSN, Velva Harman Date of Birth/Sex: Male) Other Clinician: Primary Care Physician: Concepcion Living Referring Physician: Sena Hitch Physician/Extender: Weeks in Treatment: 0 Fall Risk Assessment Items FALL RISK ASSESSMENT: History of falling - immediate or within 3 months 0 No Secondary diagnosis 0 No Ambulatory aid None/bed rest/wheelchair/nurse 0 Yes Crutches/cane/walker 0 No Furniture 0 No IV Access/Saline Lock 0 No Gait/Training Normal/bed rest/immobile 0 Yes Weak 0 No Impaired 0 No Mental Status Oriented to own ability 0 Yes Electronic Signature(s) Signed: 11/05/2014 10:36:01 AM By:  Afful, Velva Harman BSN, RN Entered By: Regan Lemming on 11/05/2014 10:36:00 Pulis, [name] ([id number]) -------------------------------------------------------------------------------- Foot Assessment Details Patient Name: [name], [name] 11/05/2014 10:15 Date of Service: AM Medical Record [id number] Number: Patient Account Number: [id number] [birth year] (54 y.o. Treating RN: Baruch Gouty, RN, BSN, Velva Harman Date of Birth/Sex: Male) Other Clinician: Primary Care Physician: Cora Collum, Errol Referring Physician: Sena Hitch Physician/Extender: Weeks in Treatment: 0 Foot Assessment Items Site Locations + = Sensation present, - = Sensation absent, C = Callus, U = Ulcer R = Redness, W = Warmth, M = Maceration, PU = Pre-ulcerative lesion F = Fissure, S = Swelling, D = Dryness Assessment Right: Left: Other Deformity: No No Prior Foot Ulcer: No No Prior Amputation: No No Charcot Joint: No No Ambulatory Status: Ambulatory Without Help Gait: Steady Electronic Signature(s) Signed: 11/05/2014 10:36:51 AM By: Regan Lemming BSN, RN Entered By: Regan Lemming on 11/05/2014 10:36:51 Samuelson, [name]  ([id number]) -------------------------------------------------------------------------------- Nutrition Risk Assessment Details Patient Name: [name] Form A. 11/05/2014 10:15 Date of Service: AM Medical Record [id number] Number: Patient Account Number: [id number] May 21, 1960 (54 y.o. Treating RN: Afful, RN, BSN, Velva Harman Date of Birth/Sex: Male) Other Clinician: Primary Care Physician: Cora Collum, Errol Referring Physician: Sena Hitch Physician/Extender: Weeks in Treatment: 0 Height (in): 73 Weight (lbs): 126 Body Mass Index (BMI): 16.6 Nutrition Risk Assessment Items NUTRITION RISK SCREEN: I have an illness or condition that made me change the kind and/or 0 No amount of food I eat I eat fewer than two meals per day 3 Yes I eat few fruits and vegetables, or milk products 0 No I have three or more drinks of beer, liquor or wine almost every day 0 No I have tooth or mouth problems that make it hard for me to eat 0 No I don't always have enough money to buy the food I need 0 No I eat alone most of the time 0 No I take three or more different prescribed or over-the-counter drugs a 0 No day Without wanting to, I have lost or gained 10 pounds in the last six 2 Yes months I am not always physically able to shop, cook and/or feed myself 0 No Nutrition Protocols Good Risk Protocol Provide education on Moderate Risk Protocol 0 nutrition Electronic Signature(s) Signed: 11/05/2014 10:36:19 AM By: Regan Lemming BSN, RN Entered By: Regan Lemming on 11/05/2014 10:36:19

## 2014-11-11 ENCOUNTER — Telehealth: Payer: Self-pay | Admitting: *Deleted

## 2014-11-11 ENCOUNTER — Inpatient Hospital Stay: Payer: Medicaid Other

## 2014-11-11 DIAGNOSIS — C9000 Multiple myeloma not having achieved remission: Secondary | ICD-10-CM

## 2014-11-11 LAB — CBC WITH DIFFERENTIAL/PLATELET
BASOS PCT: 1 %
Basophils Absolute: 0.1 10*3/uL (ref 0–0.1)
EOS ABS: 0.3 10*3/uL (ref 0–0.7)
EOS PCT: 6 %
HCT: 35.1 % — ABNORMAL LOW (ref 40.0–52.0)
Hemoglobin: 11.4 g/dL — ABNORMAL LOW (ref 13.0–18.0)
Lymphocytes Relative: 13 %
Lymphs Abs: 0.8 10*3/uL — ABNORMAL LOW (ref 1.0–3.6)
MCH: 30.9 pg (ref 26.0–34.0)
MCHC: 32.5 g/dL (ref 32.0–36.0)
MCV: 94.9 fL (ref 80.0–100.0)
MONO ABS: 0.7 10*3/uL (ref 0.2–1.0)
MONOS PCT: 12 %
Neutro Abs: 4.1 10*3/uL (ref 1.4–6.5)
Neutrophils Relative %: 68 %
PLATELETS: 220 10*3/uL (ref 150–440)
RBC: 3.7 MIL/uL — ABNORMAL LOW (ref 4.40–5.90)
RDW: 17.3 % — AB (ref 11.5–14.5)
WBC: 6 10*3/uL (ref 3.8–10.6)

## 2014-11-11 MED ORDER — ALPRAZOLAM 0.5 MG PO TABS: ORAL_TABLET | ORAL | Status: DC

## 2014-11-11 MED ORDER — FENTANYL 25 MCG/HR TD PT72: MEDICATED_PATCH | TRANSDERMAL | Status: DC

## 2014-11-11 MED ORDER — FENTANYL 100 MCG/HR TD PT72: MEDICATED_PATCH | TRANSDERMAL | Status: DC

## 2014-11-11 MED ORDER — FENTANYL 12 MCG/HR TD PT72: MEDICATED_PATCH | TRANSDERMAL | Status: DC

## 2014-11-11 NOTE — Telephone Encounter (Signed)
Printed, pt in infusion

## 2014-11-12 ENCOUNTER — Ambulatory Visit: Payer: Medicaid Other | Admitting: Surgery

## 2014-11-19 ENCOUNTER — Encounter: Payer: Medicaid Other | Admitting: Surgery

## 2014-11-19 DIAGNOSIS — L89223 Pressure ulcer of left hip, stage 3: Secondary | ICD-10-CM | POA: Diagnosis not present

## 2014-11-20 NOTE — Progress Notes (Signed)
[name], [name] ([id number]) Visit Report for 11/19/2014 Chief Complaint Document Details Patient Name: [name], [name] 11/19/2014 10:45 Date of Service: AM Medical Record [id number] Number: Patient Account Number: [id number] 1960/07/13 (54 y.o. Treating RN: Cornell Barman Date of Birth/Sex: Male) Other Clinician: Primary Care Physician: Sena Hitch Treating Christin Fudge Referring Physician: Sena Hitch Physician/Extender: Weeks in Treatment: 2 Information Obtained from: Patient Chief Complaint Patient is at the clinic for treatment of an open pressure ulcer. 54 year old gentleman with a ulcerated area on his left hip she's had for 3 weeks. Electronic Signature(s) Signed: 11/19/2014 11:06:53 AM By: Christin Fudge MD, FACS Entered By: Christin Fudge on 11/19/2014 11:06:53 [name] ([id number]) -------------------------------------------------------------------------------- HPI Details Patient Name: [name] Form A. 11/19/2014 10:45 Date of Service: AM Medical Record [id number] Number: Patient Account Number: [id number] 02/10/1960 (54 y.o. Treating RN: Cornell Barman Date of Birth/Sex: Male) Other Clinician: Primary Care Physician: Sena Hitch Treating Christin Fudge Referring Physician: Sena Hitch Physician/Extender: Weeks in Treatment: 2 History of Present Illness Location: left hip area Quality: Patient reports experiencing a dull pain to affected area(s). Severity: Patient states wound are getting worse. Duration: Patient has had the wound for < 3 weeks prior to presenting for treatment Timing: Pain in wound is Intermittent (comes and goes Context: The wound appeared gradually over time Modifying Factors: Other treatment(s) tried include: local dressing changes with antibiotic cream Associated Signs and Symptoms: Patient reports having difficulty standing for long periods. HPI Description: The patient is under treatment with medical oncology Dr.  Ma Hillock sees him, for multiple myeloma, anemia, COPD, peripheral neuropathy, pedal edema and has been referred to [place] for left lateral hip pressure ulcer. his past medical history significant for coronary artery disease, dilated cardiomyopathy with an EF of 20% and has had her AICD placement, kidney stones, asthma, COPD, polysubstance abuse, anxiety. continues to smoke a pack of cigarettes a day, history of recreational drug usage and used to take IV drugs in the late 1980s. He was in bed for a long while due to an associated illness and lay on his left side for prolonged periods of time. He has been ambulatory otherwise and has a fairly poor nutrition. Electronic Signature(s) Signed: 11/19/2014 11:06:58 AM By: Christin Fudge MD, FACS Entered By: Christin Fudge on 11/19/2014 11:06:58 [name] ([id number]) -------------------------------------------------------------------------------- Physical Exam Details Patient Name: [name], [name] 11/19/2014 10:45 Date of Service: AM Medical Record [id number] Number: Patient Account Number: [id number] 04-12-1960 (54 y.o. Treating RN: Cornell Barman Date of Birth/Sex: Male) Other Clinician: Primary Care Physician: Sena Hitch Treating Christin Fudge Referring Physician: Sena Hitch Physician/Extender: Weeks in Treatment: 2 Constitutional . Pulse regular. Respirations normal and unlabored. Afebrile. . Eyes Nonicteric. Reactive to light. Ears, Nose, Mouth, and Throat Lips, teeth, and gums WNL.Marland Kitchen Moist mucosa without lesions . Neck supple and nontender. No palpable supraclavicular or cervical adenopathy. Normal sized without goiter. Respiratory WNL. No retractions.. Cardiovascular Pedal Pulses WNL. No clubbing, cyanosis or edema. Lymphatic No adneopathy. No adenopathy. No adenopathy. Musculoskeletal Adexa without tenderness or enlargement.. Digits and nails w/o clubbing, cyanosis, infection, petechiae, ischemia, or inflammatory  conditions.. Integumentary (Hair, Skin) No suspicious lesions. No crepitus or fluctuance. No peri-wound warmth or erythema. No masses.Marland Kitchen Psychiatric Judgement and insight Intact.. No evidence of depression, anxiety, or agitation.. Notes the pressure injury on the left hip continues to have slough and there is no surrounding cellulitis or purulence and no sharp debridement is required today. Electronic Signature(s) Signed: 11/19/2014 11:07:33 AM By: Christin Fudge MD, FACS Entered By: [name] Memos  Darriana [name] on 11/19/2014 11:07:33 [name], [name] ([id number]) -------------------------------------------------------------------------------- Physician Orders Details Patient Name: [name], [name] 11/19/2014 10:45 Date of Service: AM Medical Record [id number] Number: Patient Account Number: [id number] [birth year] (54 y.o. Treating RN: Cornell Barman Date of Birth/Sex: Male) Other Clinician: Primary Care Physician: Sena Hitch Treating Christin Fudge Referring Physician: Sena Hitch Physician/Extender: Suella [place] in Treatment: 2 Verbal / Phone Orders: Yes Clinician: Cornell Barman Read Back and Verified: Yes Diagnosis Coding Wound Cleansing Wound #1 Left Ischial Tuberosity o Clean wound with Normal Saline. Anesthetic Wound #1 Left Ischial Tuberosity o Topical Lidocaine 4% cream applied to wound bed prior to debridement Skin Barriers/Peri-Wound Care Wound #1 Left Ischial Tuberosity o Barrier cream Primary Wound Dressing Wound #1 Left Ischial Tuberosity o Santyl Ointment Secondary Dressing Wound #1 Left Ischial Tuberosity o Boardered Foam Dressing Dressing Change Frequency Wound #1 Left Ischial Tuberosity o Change dressing every day. Follow-up Appointments Wound #1 Left Ischial Tuberosity o Return Appointment in 1 week. Off-Loading Wound #1 Left Ischial Tuberosity o Turn and reposition every 2 hours [name], [name] ([id number]) Additional Orders / Instructions Wound #1  Left Ischial Tuberosity o Stop Smoking o Increase protein intake. o Other: - obtain vitamins;zinc, vitamin C Electronic Signature(s) Signed: 11/19/2014 3:59:19 PM By: Christin Fudge MD, FACS Signed: 11/19/2014 6:06:11 PM By: Gretta Cool RN, BSN, Kim RN, BSN Entered By: Gretta Cool, RN, BSN, Kim on 11/19/2014 10:57:10 Digiacomo, [name] ([id number]) -------------------------------------------------------------------------------- Problem List Details Patient Name: [name], [name] 11/19/2014 10:45 Date of Service: AM Medical Record [id number] Number: Patient Account Number: [id number] 17-Feb-1960 (54 y.o. Treating RN: Cornell Barman Date of Birth/Sex: Male) Other Clinician: Primary Care Physician: Sena Hitch Treating Christin Fudge Referring Physician: Sena Hitch Physician/Extender: Weeks in Treatment: 2 Active Problems ICD-10 Encounter Code Description Active Date Diagnosis L89.223 Pressure ulcer of left hip, stage 3 11/05/2014 Yes C90.00 Multiple myeloma not having achieved remission 11/05/2014 Yes F17.218 Nicotine dependence, cigarettes, with other nicotine- 11/05/2014 Yes induced disorders E44.1 Mild protein-calorie malnutrition 11/05/2014 Yes Inactive Problems Resolved Problems Electronic Signature(s) Signed: 11/19/2014 11:06:46 AM By: Christin Fudge MD, FACS Entered By: Christin Fudge on 11/19/2014 11:06:46 [name], [name] ([id number]) -------------------------------------------------------------------------------- Progress Note Details Patient Name: [name] Form A. 11/19/2014 10:45 Date of Service: AM Medical Record [id number] Number: Patient Account Number: [id number] 1960/07/11 (54 y.o. Treating RN: Cornell Barman Date of Birth/Sex: Male) Other Clinician: Primary Care Physician: Sena Hitch Treating Christin Fudge Referring Physician: Sena Hitch Physician/Extender: Weeks in Treatment: 2 Subjective Chief Complaint Information obtained from  Patient Patient is at the clinic for treatment of an open pressure ulcer. 54 year old gentleman with a ulcerated area on his left hip she's had for 3 weeks. History of Present Illness (HPI) The following HPI elements were documented for the patient's wound: Location: left hip area Quality: Patient reports experiencing a dull pain to affected area(s). Severity: Patient states wound are getting worse. Duration: Patient has had the wound for < 3 weeks prior to presenting for treatment Timing: Pain in wound is Intermittent (comes and goes Context: The wound appeared gradually over time Modifying Factors: Other treatment(s) tried include: local dressing changes with antibiotic cream Associated Signs and Symptoms: Patient reports having difficulty standing for long periods. The patient is under treatment with medical oncology Dr. Ma Hillock sees him, for multiple myeloma, anemia, COPD, peripheral neuropathy, pedal edema and has been referred to [place] for left lateral hip pressure ulcer. his past medical history significant for coronary artery disease, dilated cardiomyopathy with an EF of 20% and has had her AICD placement,  kidney stones, asthma, COPD, polysubstance abuse, anxiety. continues to smoke a pack of cigarettes a day, history of recreational drug usage and used to take IV drugs in the late 1980s. He was in bed for a long while due to an associated illness and lay on his left side for prolonged periods of time. He has been ambulatory otherwise and has a fairly poor nutrition. Objective Constitutional Pulse regular. Respirations normal and unlabored. Afebrile. [name], [name] A. ([id number]) Vitals Time Taken: 10:47 AM, Height: 73 in, Weight: 132 lbs, BMI: 17.4, Temperature: 98.0 [name], Pulse: 73 bpm, Respiratory Rate: 18 breaths/min, Blood Pressure: 112/69 mmHg. Eyes Nonicteric. Reactive to light. Ears, Nose, Mouth, and Throat Lips, teeth, and gums WNL.Marland Kitchen Moist mucosa without lesions  . Neck supple and nontender. No palpable supraclavicular or cervical adenopathy. Normal sized without goiter. Respiratory WNL. No retractions.. Cardiovascular Pedal Pulses WNL. No clubbing, cyanosis or edema. Lymphatic No adneopathy. No adenopathy. No adenopathy. Musculoskeletal Adexa without tenderness or enlargement.. Digits and nails w/o clubbing, cyanosis, infection, petechiae, ischemia, or inflammatory conditions.Marland Kitchen Psychiatric Judgement and insight Intact.. No evidence of depression, anxiety, or agitation.. General Notes: the pressure injury on the left hip continues to have slough and there is no surrounding cellulitis or purulence and no sharp debridement is required today. Integumentary (Hair, Skin) No suspicious lesions. No crepitus or fluctuance. No peri-wound warmth or erythema. No masses.. Wound #1 status is Open. Original cause of wound was Gradually Appeared. The wound is located on the Left Ischial Tuberosity. The wound measures 2.5cm length x 4cm width x 0.1cm depth; 7.854cm^2 area and 0.785cm^3 volume. The wound is limited to skin breakdown. There is a small amount of serosanguineous drainage noted. The wound margin is distinct with the outline attached to the wound base. There is no granulation within the wound bed. There is a large (67-100%) amount of necrotic tissue within the wound bed including Adherent Slough. The periwound skin appearance exhibited: Moist, Erythema. The periwound skin appearance did not exhibit: Callus, Crepitus, Excoriation, Fluctuance, Friable, Induration, Localized Edema, Rash, Scarring, Dry/Scaly, Maceration, Atrophie Blanche, Cyanosis, Ecchymosis, Hemosiderin Staining, Mottled, Pallor, Rubor. The surrounding wound skin color is noted with erythema which is circumferential. Erythema is measured at 7 cm. Periwound temperature was noted as No Abnormality. The periwound has tenderness on palpation. [name], [name] A. ([id number]) Assessment Active  Problems ICD-10 [phone] - Pressure ulcer of left hip, stage 3 C90.00 - Multiple myeloma not having achieved remission F17.218 - Nicotine dependence, cigarettes, with other nicotine-induced disorders E44.1 - Mild protein-calorie malnutrition I have recommended we continue with Santyl ointment locally and cover it with bordered foam and offload this as much as possible and continue to change position. I have also recommended every effort to increase his protein intake, multivitamins and minerals and he and his wife are trying to be most compliant. Plan Wound Cleansing: Wound #1 Left Ischial Tuberosity: Clean wound with Normal Saline. Anesthetic: Wound #1 Left Ischial Tuberosity: Topical Lidocaine 4% cream applied to wound bed prior to debridement Skin Barriers/Peri-Wound Care: Wound #1 Left Ischial Tuberosity: Barrier cream Primary Wound Dressing: Wound #1 Left Ischial Tuberosity: Santyl Ointment Secondary Dressing: Wound #1 Left Ischial Tuberosity: Boardered Foam Dressing Dressing Change Frequency: Wound #1 Left Ischial Tuberosity: Change dressing every day. Follow-up Appointments: Wound #1 Left Ischial Tuberosity: Return Appointment in 1 week. Off-Loading: Wound #1 Left Ischial Tuberosity: Turn and reposition every 2 hours Additional Orders / Instructions: Wound #1 Left Ischial Tuberosity: [name], [name] A. ([id number]) Stop Smoking Increase protein intake. Other: -  obtain vitamins;zinc, vitamin C I have recommended we continue with Santyl ointment locally and cover it with bordered foam and offload this as much as possible and continue to change position. I have also recommended every effort to increase his protein intake, multivitamins and minerals and he and his wife are trying to be most compliant. Electronic Signature(s) Signed: 11/19/2014 11:09:42 AM By: Christin Fudge MD, FACS Entered By: Christin Fudge on 11/19/2014 11:09:41 [name], [name]  ([id number]) -------------------------------------------------------------------------------- SuperBill Details Patient Name: [name] Date of Service: 11/19/2014 Medical Record Number: [id number] Patient Account Number: [id number] Date of Birth/Sex: 15-Jul-1960 (53 y.o. Male) Treating RN: Cornell Barman Primary Care Physician: Sena Hitch Other Clinician: Referring Physician: Sena Hitch Treating Physician/Extender: Frann Rider in Treatment: 2 Diagnosis Coding ICD-10 Codes Code Description [phone] Pressure ulcer of left hip, stage 3 C90.00 Multiple myeloma not having achieved remission F17.218 Nicotine dependence, cigarettes, with other nicotine-induced disorders E44.1 Mild protein-calorie malnutrition Facility Procedures CPT4 Code: [id number] Description: 99213 - WOUND CARE VISIT-LEV 3 EST PT Modifier: Quantity: 1 Physician Procedures CPT4 Code Description: [id number] 99213 - WC PHYS LEVEL 3 - EST PT ICD-10 Description Diagnosis L89.223 Pressure ulcer of left hip, stage 3 C90.00 Multiple myeloma not having achieved remission F17.218 Nicotine dependence, cigarettes, with other nicot E44.1  Mild protein-calorie malnutrition Modifier: ine-induced di Quantity: 1 sorders Electronic Signature(s) Signed: 11/19/2014 11:10:00 AM By: Christin Fudge MD, FACS Entered By: Christin Fudge on 11/19/2014 11:09:59

## 2014-11-20 NOTE — Progress Notes (Signed)
[name] ([id number]) Visit Report for 11/19/2014 Arrival Information Details Patient Name: [name], [name] Date of Service: 11/19/2014 10:45 AM Medical Record Number: [id number] Patient Account Number: [id number] Date of Birth/Sex: 03/31/1960 (54 y.o. Male) Treating RN: Cornell Barman Primary Care Physician: Sena Hitch Other Clinician: Referring Physician: Sena Hitch Treating Physician/Extender: Frann Rider in Treatment: 2 Visit Information History Since Last Visit Added or deleted any medications: No Patient Arrived: Ambulatory Any new allergies or adverse reactions: No Arrival Time: 10:44 Had a fall or experienced change in No Accompanied By: wife activities of daily living that may affect Transfer Assistance: None risk of falls: Patient Identification Verified: Yes Signs or symptoms of abuse/neglect since last No Secondary Verification Process Yes visito Completed: Hospitalized since last visit: No Patient Requires Transmission-Based No Has Dressing in Place as Prescribed: Yes Precautions: Pain Present Now: No Patient Has Alerts: No Electronic Signature(s) Signed: 11/19/2014 6:06:11 PM By: Gretta Cool, RN, BSN, Kim RN, BSN Entered By: Gretta Cool, RN, BSN, Kim on 11/19/2014 10:47:45 Bayle, [name] ([id number]) -------------------------------------------------------------------------------- Clinic Level of Care Assessment Details Patient Name: [name] Date of Service: 11/19/2014 10:45 AM Medical Record Number: [id number] Patient Account Number: [id number] Date of Birth/Sex: 07-Jul-1960 (54 y.o. Male) Treating RN: Cornell Barman Primary Care Physician: Sena Hitch Other Clinician: Referring Physician: Sena Hitch Treating Physician/Extender: Frann Rider in Treatment: 2 Clinic Level of Care Assessment Items TOOL 4 Quantity Score []  - Use when only an EandM is performed on FOLLOW-UP visit 0 ASSESSMENTS - Nursing Assessment /  Reassessment X - Reassessment of Co-morbidities (includes updates in patient status) 1 10 X - Reassessment of Adherence to Treatment Plan 1 5 ASSESSMENTS - Wound and Skin Assessment / Reassessment X - Simple Wound Assessment / Reassessment - one wound 1 5 []  - Complex Wound Assessment / Reassessment - multiple wounds 0 []  - Dermatologic / Skin Assessment (not related to wound area) 0 ASSESSMENTS - Focused Assessment []  - Circumferential Edema Measurements - multi extremities 0 []  - Nutritional Assessment / Counseling / Intervention 0 []  - Lower Extremity Assessment (monofilament, tuning fork, pulses) 0 []  - Peripheral Arterial Disease Assessment (using hand held doppler) 0 ASSESSMENTS - Ostomy and/or Continence Assessment and Care []  - Incontinence Assessment and Management 0 []  - Ostomy Care Assessment and Management (repouching, etc.) 0 PROCESS - Coordination of Care X - Simple Patient / Family Education for ongoing care 1 15 []  - Complex (extensive) Patient / Family Education for ongoing care 0 []  - Staff obtains Programmer, systems, Records, Test Results / Process Orders 0 []  - Staff telephones HHA, Nursing Homes / Clarify orders / etc 0 []  - Routine Transfer to another Facility (non-emergent condition) 0 Sames, Orlandis A. ([id number]) []  - Routine Hospital Admission (non-emergent condition) 0 []  - New Admissions / Biomedical engineer / Ordering NPWT, Apligraf, etc. 0 []  - Emergency Hospital Admission (emergent condition) 0 X - Simple Discharge Coordination 1 10 []  - Complex (extensive) Discharge Coordination 0 PROCESS - Special Needs []  - Pediatric / Minor Patient Management 0 []  - Isolation Patient Management 0 []  - Hearing / Language / Visual special needs 0 []  - Assessment of Community assistance (transportation, D/C planning, etc.) 0 []  - Additional assistance / Altered mentation 0 []  - Support Surface(s) Assessment (bed, cushion, seat, etc.) 0 INTERVENTIONS - Wound Cleansing /  Measurement X - Simple Wound Cleansing - one wound 1 5 []  - Complex Wound Cleansing - multiple wounds 0 X - Wound Imaging (photographs - any number of  wounds) 1 5 []  - Wound Tracing (instead of photographs) 0 X - Simple Wound Measurement - one wound 1 5 []  - Complex Wound Measurement - multiple wounds 0 INTERVENTIONS - Wound Dressings X - Small Wound Dressing one or multiple wounds 1 10 []  - Medium Wound Dressing one or multiple wounds 0 []  - Large Wound Dressing one or multiple wounds 0 X - Application of Medications - topical 1 5 []  - Application of Medications - injection 0 INTERVENTIONS - Miscellaneous []  - External ear exam 0 Pare, Windell A. ([id number]) []  - Specimen Collection (cultures, biopsies, blood, body fluids, etc.) 0 []  - Specimen(s) / Culture(s) sent or taken to Lab for analysis 0 []  - Patient Transfer (multiple staff / Harrel Lemon Lift / Similar devices) 0 []  - Simple Staple / Suture removal (25 or less) 0 []  - Complex Staple / Suture removal (26 or more) 0 []  - Hypo / Hyperglycemic Management (close monitor of Blood Glucose) 0 []  - Ankle / Brachial Index (ABI) - do not check if billed separately 0 X - Vital Signs 1 5 Has the patient been seen at the hospital within the last three years: Yes Total Score: 80 Level Of Care: New/Established - Level 3 Electronic Signature(s) Signed: 11/19/2014 6:06:11 PM By: Gretta Cool, RN, BSN, Kim RN, BSN Entered By: Gretta Cool, RN, BSN, Kim on 11/19/2014 10:57:50 Rosales, [name] ([id number]) -------------------------------------------------------------------------------- Encounter Discharge Information Details Patient Name: [name] Date of Service: 11/19/2014 10:45 AM Medical Record Number: [id number] Patient Account Number: [id number] Date of Birth/Sex: March 30, 1960 (54 y.o. Male) Treating RN: Cornell Barman Primary Care Physician: Sena Hitch Other Clinician: Referring Physician: Sena Hitch Treating Physician/Extender: Frann Rider in Treatment: 2 Encounter Discharge Information Items Discharge Pain Level: 0 Discharge Condition: Stable Ambulatory Status: Ambulatory Discharge Destination: Home Transportation: Private Auto Accompanied By: wife Schedule Follow-up Appointment: Yes Medication Reconciliation completed and provided to Patient/Care Yes Arael Piccione: Provided on Clinical Summary of Care: 11/19/2014 Form Type Recipient Paper Patient RM Electronic Signature(s) Signed: 11/19/2014 11:05:21 AM By: Ruthine Dose Entered By: Ruthine Dose on 11/19/2014 11:05:21 Glascoe, Jeniel AMarland Kitchen ([id number]) -------------------------------------------------------------------------------- Multi Wound Chart Details Patient Name: [name] Date of Service: 11/19/2014 10:45 AM Medical Record Number: [id number] Patient Account Number: [id number] Date of Birth/Sex: 12-12-1960 (54 y.o. 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] [N/A:N/A] Comorbid History: [1:Chronic Obstructive Pulmonary Disease (COPD), Arrhythmia, Hypertension, Received Radiation] [N/A:N/A] Date Acquired: [1:10/19/2014] [N/A:N/A] Weeks of Treatment: [1:2] [N/A:N/A] Wound Status: [1:Open] [N/A:N/A] Measurements L x W x D 2.5x4x0.1 [N/A:N/A] (cm) Area (cm) : [1:7.854] [N/A:N/A] Volume (cm) : [1:0.785] [N/A:N/A] % Reduction in Area: [1:-127.30%] [N/A:N/A] % Reduction in Volume: -126.90% [N/A:N/A] Classification: [1:Partial Thickness]  [N/A:N/A] Exudate Amount: [1:Small] [N/A:N/A] Exudate Type: [1:Serosanguineous] [N/A:N/A] Exudate Color: [1:red, brown] [N/A:N/A] Wound Margin: [1:Distinct, outline attached] [N/A:N/A] Granulation Amount: [1:None Present (0%)] [N/A:N/A] Necrotic Amount: [1:Large (67-100%)] [N/A:N/A] Exposed Structures: [1:Fascia: No Fat: No Tendon: No Muscle: No Joint: No] [N/A:N/A] Bone: No Limited to Skin Breakdown Epithelialization: None N/A N/A Periwound Skin Texture: Edema: No N/A N/A Excoriation: No Induration: No Callus: No Crepitus: No Fluctuance: No Friable: No Rash: No Scarring: No Periwound Skin Moist: Yes N/A N/A  Moisture: Maceration: No Dry/Scaly: No Periwound Skin Color: Erythema: Yes N/A N/A Atrophie Blanche: No Cyanosis: No Ecchymosis: No Hemosiderin Staining: No Mottled: No Pallor: No Rubor: No Erythema Location: Circumferential N/A N/A Erythema Measurement: Measured: 7cm N/A N/A Temperature: No Abnormality N/A N/A Tenderness on Yes N/A N/A Palpation: Wound Preparation: Ulcer Cleansing: N/A N/A Rinsed/Irrigated with Saline Topical Anesthetic Applied: Other: lidocaine 4% Treatment Notes Electronic Signature(s) Signed: 11/19/2014 6:06:11 PM By: Gretta Cool, RN, BSN, Kim RN, BSN Entered By: Gretta Cool, RN, BSN, Kim on 11/19/2014 10:55:35 Petrow, [name] ([id number]) -------------------------------------------------------------------------------- [place] Details Patient Name: [name] Date of Service: 11/19/2014 10:45 AM Medical Record Number: [id number] Patient Account Number: [id number] Date of Birth/Sex: 01-Mar-1960 (54 y.o. Male) Treating RN: Cornell Barman Primary Care Physician: Sena Hitch Other Clinician: Referring Physician: Sena Hitch Treating Physician/Extender: Frann Rider in Treatment: 2 Active Inactive Orientation to the Wound Care Program Nursing Diagnoses: Knowledge deficit related to the wound healing center  program Goals: Patient/caregiver will verbalize understanding of the [place] Program Date Initiated: 11/19/2014 Goal Status: Active Interventions: Provide education on orientation to the wound center Notes: Pressure Nursing Diagnoses: Knowledge deficit related to causes and risk factors for pressure ulcer development Goals: Patient will remain free of pressure ulcers Date Initiated: 11/19/2014 Goal Status: Active Interventions: Assess: immobility, friction, shearing, incontinence upon admission and as needed Treatment Activities: Patient referred for seating evaluation to ensure proper offloading : 11/19/2014 Notes: Wound/Skin Impairment Nursing Diagnoses: Impaired tissue integrity Mcray, [place] A. ([id number]) Goals: Ulcer/skin breakdown will heal within 14 weeks Date Initiated: 11/19/2014 Goal Status: Active Interventions: Assess ulceration(s) every visit Treatment Activities: Topical wound management initiated : 11/19/2014 Notes: Electronic Signature(s) Signed: 11/19/2014 6:06:11 PM By: Gretta Cool, RN, BSN, Kim RN, BSN Entered By: Gretta Cool, RN, BSN, Kim on 11/19/2014 10:53:15 Mezo, [name] ([id number]) -------------------------------------------------------------------------------- Pain Assessment Details Patient Name: [name] Date of Service: 11/19/2014 10:45 AM Medical Record Number: [id number] Patient Account Number: [id number] Date of Birth/Sex: 12/20/1960 (54 y.o. Male) Treating RN: Cornell Barman Primary Care Physician: Sena Hitch Other Clinician: Referring Physician: Sena Hitch Treating Physician/Extender: Frann Rider in Treatment: 2 Active Problems Location of Pain Severity and Description of Pain Patient Has Paino No Site Locations Pain Management and Medication Current Pain Management: Electronic Signature(s) Signed: 11/19/2014 6:06:11 PM By: Gretta Cool, RN, BSN, Kim RN, BSN Entered By: Gretta Cool, RN, BSN, Kim on 11/19/2014  10:47:50 Gagen, [name] ([id number]) -------------------------------------------------------------------------------- Patient/Caregiver Education Details Patient Name: [name] Date of Service: 11/19/2014 10:45 AM Medical Record Number: [id number] Patient Account Number: [id number] Date of Birth/Gender: [birth year] (54 y.o. Male) Treating RN: Cornell Barman Primary Care Physician: Sena Hitch Other Clinician: Referring Physician: Sena Hitch Treating Physician/Extender: Frann Rider in Treatment: 2 Education Assessment Education Provided To: Patient Education Topics Provided Wound/Skin Impairment: Handouts: Caring for Your Ulcer, Other: continue wound care as prescribed Methods: Demonstration, Explain/Verbal Responses: State content correctly Electronic Signature(s) Signed: 11/19/2014 6:06:11 PM By: Gretta Cool, RN, BSN, Kim RN, BSN Entered By: Gretta Cool, RN, BSN, Kim on 11/19/2014 10:59:19 Gladue, [name] ([id number]) -------------------------------------------------------------------------------- Wound Assessment Details Patient Name: [name] Date of Service: 11/19/2014 10:45 AM Medical Record Number: [id number] Patient Account Number: [id number] Date of Birth/Sex: 1960/07/24 (54 y.o. Male) Treating RN: Cornell Barman Primary Care Physician: Sena Hitch Other Clinician: Referring Physician: Sena Hitch Treating Physician/Extender: Frann Rider in Treatment: 2 Wound Status Wound Number: 1 Primary Malignant Wound Etiology: Wound Location: Left Ischial Tuberosity Wound Open Wounding Event: Gradually Appeared Status: Date Acquired:  10/19/2014 Comorbid Chronic Obstructive Pulmonary Weeks Of Treatment: 2 History: Disease (COPD), Arrhythmia, Clustered Wound: No Hypertension, Received Radiation Photos Photo Uploaded By: Gretta Cool, RN, BSN, Kim on 11/19/2014 18:04:37 Wound Measurements Length: (cm) 2.5 Width: (cm) 4 Depth: (cm) 0.1 Area:  (cm) 7.854 Volume: (cm) 0.785 % Reduction in Area: -127.3% % Reduction in Volume: -126.9% Epithelialization: None Wound Description Classification: Partial Thickness Wound Margin: Distinct, outline attached Exudate Amount: Small Exudate Type: Serosanguineous Exudate Color: red, brown Foul Odor After Cleansing: No Wound Bed Granulation Amount: None Present (0%) Exposed Structure Necrotic Amount: Large (67-100%) Fascia Exposed: No Necrotic Quality: Adherent Slough Fat Layer Exposed: No Tendon Exposed: No Portales, Geremy A. ([id number]) Muscle Exposed: No Joint Exposed: No Bone Exposed: No Limited to Skin Breakdown Periwound Skin Texture Texture Color No Abnormalities Noted: No No Abnormalities Noted: No Callus: No Atrophie Blanche: No Crepitus: No Cyanosis: No Excoriation: No Ecchymosis: No Fluctuance: No Erythema: Yes Friable: No Erythema Location: Circumferential Induration: No Erythema Measurement: Measured Localized Edema: No 7 cm Rash: No Hemosiderin Staining: No Scarring: No Mottled: No Pallor: No Moisture Rubor: No No Abnormalities Noted: No Dry / Scaly: No Temperature / Pain Maceration: No Temperature: No Abnormality Moist: Yes Tenderness on Palpation: Yes Wound Preparation Ulcer Cleansing: Rinsed/Irrigated with Saline Topical Anesthetic Applied: Other: lidocaine 4%, Treatment Notes Wound #1 (Left Ischial Tuberosity) 1. Cleansed with: Clean wound with Normal Saline 2. Anesthetic Topical Lidocaine 4% cream to wound bed prior to debridement 4. Dressing Applied: Santyl Ointment 5. Secondary Dressing Applied Bordered Foam Dressing Electronic Signature(s) Signed: 11/19/2014 6:06:11 PM By: Gretta Cool, RN, BSN, Kim RN, BSN Entered By: Gretta Cool, RN, BSN, Kim on 11/19/2014 10:52:17 Everetts, [name] ([id number]) -------------------------------------------------------------------------------- Vitals Details Patient Name: [name] Date of Service:  11/19/2014 10:45 AM Medical Record Number: [id number] Patient Account Number: [id number] Date of Birth/Sex: September 08, 1960 (54 y.o. Male) Treating RN: Cornell Barman Primary Care Physician: Sena Hitch Other Clinician: Referring Physician: Sena Hitch Treating Physician/Extender: Frann Rider in Treatment: 2 Vital Signs Time Taken: 10:47 Temperature (F): 98.0 Height (in): 73 Pulse (bpm): 73 Weight (lbs): 132 Respiratory Rate (breaths/min): 18 Body Mass Index (BMI): 17.4 Blood Pressure (mmHg): 112/69 Reference Range: 80 - 120 mg / dl Electronic Signature(s) Signed: 11/19/2014 6:06:11 PM By: Gretta Cool, RN, BSN, Kim RN, BSN Entered By: Gretta Cool, RN, BSN, Kim on 11/19/2014 10:48:22

## 2014-11-23 ENCOUNTER — Other Ambulatory Visit: Payer: Self-pay | Admitting: *Deleted

## 2014-11-23 DIAGNOSIS — C9000 Multiple myeloma not having achieved remission: Secondary | ICD-10-CM

## 2014-11-23 MED ORDER — LENALIDOMIDE 25 MG PO CAPS: 25.0000 mg | ORAL_CAPSULE | Freq: Every day | ORAL | Status: DC

## 2014-11-23 NOTE — Progress Notes (Signed)
RX refill for Revlimid 25 mg 1 capsul daily every day x 21 days, then 7 days off. RX RF [facility]ed to [facility]. [facility] Auth# [id number]

## 2014-11-25 ENCOUNTER — Inpatient Hospital Stay: Payer: Medicaid Other

## 2014-11-25 ENCOUNTER — Telehealth: Payer: Self-pay | Admitting: *Deleted

## 2014-11-25 NOTE — Telephone Encounter (Signed)
Patient has medicaid and Dr Rogue Bussing ordered his med, Please resend prescription by another md so he can get his med

## 2014-11-25 NOTE — Telephone Encounter (Signed)
The Revlimid RX was refaxed to [facility] with Georgeanne Nim, NP's signature.

## 2014-11-26 ENCOUNTER — Inpatient Hospital Stay: Payer: Medicaid Other | Attending: Internal Medicine

## 2014-11-26 ENCOUNTER — Encounter: Payer: Medicaid Other | Attending: Surgery | Admitting: Surgery

## 2014-11-26 ENCOUNTER — Inpatient Hospital Stay: Payer: Medicaid Other

## 2014-11-26 ENCOUNTER — Other Ambulatory Visit: Payer: Self-pay | Admitting: *Deleted

## 2014-11-26 DIAGNOSIS — C9000 Multiple myeloma not having achieved remission: Secondary | ICD-10-CM | POA: Diagnosis not present

## 2014-11-26 DIAGNOSIS — E441 Mild protein-calorie malnutrition: Secondary | ICD-10-CM | POA: Insufficient documentation

## 2014-11-26 DIAGNOSIS — F1910 Other psychoactive substance abuse, uncomplicated: Secondary | ICD-10-CM | POA: Diagnosis not present

## 2014-11-26 DIAGNOSIS — L89223 Pressure ulcer of left hip, stage 3: Secondary | ICD-10-CM | POA: Insufficient documentation

## 2014-11-26 DIAGNOSIS — F419 Anxiety disorder, unspecified: Secondary | ICD-10-CM | POA: Diagnosis not present

## 2014-11-26 DIAGNOSIS — I2510 Atherosclerotic heart disease of native coronary artery without angina pectoris: Secondary | ICD-10-CM | POA: Insufficient documentation

## 2014-11-26 DIAGNOSIS — J449 Chronic obstructive pulmonary disease, unspecified: Secondary | ICD-10-CM | POA: Diagnosis not present

## 2014-11-26 DIAGNOSIS — Z9221 Personal history of antineoplastic chemotherapy: Secondary | ICD-10-CM | POA: Insufficient documentation

## 2014-11-26 DIAGNOSIS — G629 Polyneuropathy, unspecified: Secondary | ICD-10-CM | POA: Diagnosis not present

## 2014-11-26 DIAGNOSIS — F17218 Nicotine dependence, cigarettes, with other nicotine-induced disorders: Secondary | ICD-10-CM | POA: Diagnosis not present

## 2014-11-26 DIAGNOSIS — R600 Localized edema: Secondary | ICD-10-CM | POA: Diagnosis not present

## 2014-11-26 LAB — CBC WITH DIFFERENTIAL/PLATELET
Basophils Absolute: 0 10*3/uL (ref 0–0.1)
Basophils Relative: 1 %
EOS ABS: 0.3 10*3/uL (ref 0–0.7)
EOS PCT: 4 %
HCT: 34.3 % — ABNORMAL LOW (ref 40.0–52.0)
Hemoglobin: 11.2 g/dL — ABNORMAL LOW (ref 13.0–18.0)
LYMPHS ABS: 1.4 10*3/uL (ref 1.0–3.6)
LYMPHS PCT: 21 %
MCH: 30.9 pg (ref 26.0–34.0)
MCHC: 32.7 g/dL (ref 32.0–36.0)
MCV: 94.6 fL (ref 80.0–100.0)
MONO ABS: 0.8 10*3/uL (ref 0.2–1.0)
Monocytes Relative: 13 %
Neutro Abs: 3.9 10*3/uL (ref 1.4–6.5)
Neutrophils Relative %: 61 %
PLATELETS: 279 10*3/uL (ref 150–440)
RBC: 3.62 MIL/uL — ABNORMAL LOW (ref 4.40–5.90)
RDW: 17.9 % — AB (ref 11.5–14.5)
WBC: 6.4 10*3/uL (ref 3.8–10.6)

## 2014-11-26 LAB — BASIC METABOLIC PANEL
Anion gap: 3 — ABNORMAL LOW (ref 5–15)
BUN: 9 mg/dL (ref 6–20)
CHLORIDE: 104 mmol/L (ref 101–111)
CO2: 30 mmol/L (ref 22–32)
CREATININE: 0.83 mg/dL (ref 0.61–1.24)
Calcium: 7.9 mg/dL — ABNORMAL LOW (ref 8.9–10.3)
GFR calc Af Amer: >60 mL/min (ref >60)
GFR calc non Af Amer: >60 mL/min (ref >60)
GLUCOSE: 97 mg/dL (ref 65–99)
POTASSIUM: 4 mmol/L (ref 3.5–5.1)
SODIUM: 137 mmol/L (ref 135–145)

## 2014-11-26 MED ORDER — HYDROMORPHONE HCL 2 MG PO TABS: ORAL_TABLET | ORAL | Status: DC

## 2014-11-27 NOTE — Progress Notes (Signed)
CRIST, KRUSZKA ([id number]) Visit Report for 11/26/2014 Chief Complaint Document Details Patient Name: [name], [name] Date of Service: 11/26/2014 2:15 PM Medical Record Number: [id number] Patient Account Number: [id number] Date of Birth/Sex: 1960-04-13 (54 y.o. Male) Treating RN: Montey Hora Primary Care Physician: Sena Hitch Other Clinician: Referring Physician: Sena Hitch Treating Physician/Extender: Frann Rider in Treatment: 3 Information Obtained from: Patient Chief Complaint Patient is at the clinic for treatment of an open pressure ulcer. 54 year old gentleman with a ulcerated area on his left hip she's had for 3 weeks. Electronic Signature(s) Signed: 11/26/2014 2:50:27 PM By: Christin Fudge MD, FACS Entered By: Christin Fudge on 11/26/2014 14:50:27 [name], [name] ([id number]) -------------------------------------------------------------------------------- Debridement Details Patient Name: [name] Date of Service: 11/26/2014 2:15 PM Medical Record Number: [id number] Patient Account Number: [id number] Date of Birth/Sex: 1960-03-09 (54 y.o. Male) Treating RN: Montey Hora Primary Care Physician: Sena Hitch Other Clinician: Referring Physician: Sena Hitch Treating Physician/Extender: Frann Rider in Treatment: 3 Debridement Performed for Wound #1 Left Ischial Tuberosity Assessment: Performed By: Physician Christin Fudge, MD Debridement: Debridement Pre-procedure Yes Verification/Time Out Taken: Start Time: 14:46 Pain Control: Lidocaine 4% Topical Solution Level: Skin/Subcutaneous Tissue Total Area Debrided (L x 2.2 (cm) x 3.3 (cm) = 7.26 (cm) W): Tissue and other Viable, Non-Viable, Fibrin/Slough, Subcutaneous material debrided: Instrument: Curette Bleeding: Minimum Hemostasis Achieved: Pressure End Time: 14:48 Procedural Pain: 0 Post Procedural Pain: 0 Response to Treatment: Procedure was tolerated well Post  Debridement Measurements of Total Wound Length: (cm) 2.2 Width: (cm) 3.3 Depth: (cm) 0.1 Volume: (cm) 0.57 Post Procedure Diagnosis Same as Pre-procedure Electronic Signature(s) Signed: 11/26/2014 2:50:21 PM By: Christin Fudge MD, FACS Signed: 11/26/2014 5:32:28 PM By: Montey Hora Entered By: Christin Fudge on 11/26/2014 14:50:21 [name], [name] ([id number]) -------------------------------------------------------------------------------- HPI Details Patient Name: [name] Date of Service: 11/26/2014 2:15 PM Medical Record Number: [id number] Patient Account Number: [id number] Date of Birth/Sex: Mar 26, 1960 (54 y.o. Male) Treating RN: Montey Hora Primary Care Physician: Sena Hitch Other Clinician: Referring Physician: Sena Hitch Treating Physician/Extender: Frann Rider in Treatment: 3 History of Present Illness Location: left hip area Quality: Patient reports experiencing a dull pain to affected area(s). Severity: Patient states wound are getting worse. Duration: Patient has had the wound for < 3 weeks prior to presenting for treatment Timing: Pain in wound is Intermittent (comes and goes Context: The wound appeared gradually over time Modifying Factors: Other treatment(s) tried include: local dressing changes with antibiotic cream Associated Signs and Symptoms: Patient reports having difficulty standing for long periods. HPI Description: The patient is under treatment with medical oncology Dr. Ma Hillock sees him, for multiple myeloma, anemia, COPD, peripheral neuropathy, pedal edema and has been referred to [place] for left lateral hip pressure ulcer. his past medical history significant for coronary artery disease, dilated cardiomyopathy with an EF of 20% and has had her AICD placement, kidney stones, asthma, COPD, polysubstance abuse, anxiety. continues to smoke a pack of cigarettes a day, history of recreational drug usage and used to take IV drugs in the  late 1980s. He was in bed for a long while due to an associated illness and lay on his left side for prolonged periods of time. He has been ambulatory otherwise and has a fairly poor nutrition. Electronic Signature(s) Signed: 11/26/2014 2:50:45 PM By: Christin Fudge MD, FACS Entered By: Christin Fudge on 11/26/2014 14:50:45 [name], [name] ([id number]) -------------------------------------------------------------------------------- Physical Exam Details Patient Name: [name] Date of Service: 11/26/2014 2:15 PM Medical Record Number: [id number]  Patient Account Number: [id number] Date of Birth/Sex: 1960/02/27 (54 y.o. Male) Treating RN: Montey Hora Primary Care Physician: Sena Hitch Other Clinician: Referring Physician: Sena Hitch Treating Physician/Extender: Frann Rider in Treatment: 3 Constitutional . Pulse regular. Respirations normal and unlabored. Afebrile. . Eyes Nonicteric. Reactive to light. Ears, Nose, Mouth, and Throat Lips, teeth, and gums WNL.Marland Kitchen Moist mucosa without lesions . Neck supple and nontender. No palpable supraclavicular or cervical adenopathy. Normal sized without goiter. Respiratory WNL. No retractions.. Breath sounds WNL, No rubs, rales, rhonchi, or wheeze.. Cardiovascular Heart rhythm and rate regular, no murmur or gallop.. Pedal Pulses WNL. No clubbing, cyanosis or edema. Chest Breasts symmetical and no nipple discharge.. Breast tissue WNL, no masses, lumps, or tenderness.. Lymphatic No adneopathy. No adenopathy. No adenopathy. Musculoskeletal Adexa without tenderness or enlargement.. Digits and nails w/o clubbing, cyanosis, infection, petechiae, ischemia, or inflammatory conditions.. Integumentary (Hair, Skin) No suspicious lesions. No crepitus or fluctuance. No peri-wound warmth or erythema. No masses.Marland Kitchen Psychiatric Judgement and insight Intact.. No evidence of depression, anxiety, or agitation.. Notes there is some debris  on the left hip area which will be sharply debrided with a curette. Electronic Signature(s) Signed: 11/26/2014 2:51:08 PM By: Christin Fudge MD, FACS Entered By: Christin Fudge on 11/26/2014 14:51:07 [name], [name] ([id number]) -------------------------------------------------------------------------------- Physician Orders Details Patient Name: [name] Date of Service: 11/26/2014 2:15 PM Medical Record Number: [id number] Patient Account Number: [id number] Date of Birth/Sex: 01-28-1960 (54 y.o. Male) Treating RN: Montey Hora Primary Care Physician: Sena Hitch Other Clinician: Referring Physician: Sena Hitch Treating Physician/Extender: Frann Rider in Treatment: 3 Verbal / Phone Orders: Yes Clinician: Montey Hora Read Back and Verified: Yes Diagnosis Coding Wound Cleansing Wound #1 Left Ischial Tuberosity o Clean wound with Normal Saline. Anesthetic Wound #1 Left Ischial Tuberosity o Topical Lidocaine 4% cream applied to wound bed prior to debridement Skin Barriers/Peri-Wound Care Wound #1 Left Ischial Tuberosity o Barrier cream Primary Wound Dressing Wound #1 Left Ischial Tuberosity o Santyl Ointment Secondary Dressing Wound #1 Left Ischial Tuberosity o Boardered Foam Dressing Dressing Change Frequency Wound #1 Left Ischial Tuberosity o Change dressing every day. Follow-up Appointments Wound #1 Left Ischial Tuberosity o Return Appointment in 1 week. Off-Loading Wound #1 Left Ischial Tuberosity o Turn and reposition every 2 hours Additional Orders / Instructions Wound #1 Left Ischial Tuberosity Collings, Geovanie A. ([id number]) o Stop Smoking o Increase protein intake. o Other: - obtain vitamins;zinc, vitamin C Electronic Signature(s) Signed: 11/26/2014 3:36:42 PM By: Christin Fudge MD, FACS Signed: 11/26/2014 5:32:28 PM By: Montey Hora Entered By: Montey Hora on 11/26/2014 14:49:08 [name], [name]  ([id number]) -------------------------------------------------------------------------------- Problem List Details Patient Name: [name] Date of Service: 11/26/2014 2:15 PM Medical Record Number: [id number] Patient Account Number: [id number] Date of Birth/Sex: [birth year] (54 y.o. Male) Treating RN: Montey Hora Primary Care Physician: Sena Hitch Other Clinician: Referring Physician: Sena Hitch Treating Physician/Extender: Frann Rider in Treatment: 3 Active Problems ICD-10 Encounter Code Description Active Date Diagnosis [phone] Pressure ulcer of left hip, stage 3 11/05/2014 Yes C90.00 Multiple myeloma not having achieved remission 11/05/2014 Yes F17.218 Nicotine dependence, cigarettes, with other nicotine- 11/05/2014 Yes induced disorders E44.1 Mild protein-calorie malnutrition 11/05/2014 Yes Inactive Problems Resolved Problems Electronic Signature(s) Signed: 11/26/2014 2:50:11 PM By: Christin Fudge MD, FACS Entered By: Christin Fudge on 11/26/2014 14:50:11 [name], [name] ([id number]) -------------------------------------------------------------------------------- Progress Note Details Patient Name: [name] Date of Service: 11/26/2014 2:15 PM Medical Record Number: [id number] Patient Account Number: [id number] Date of Birth/Sex: [birth year] (53 y.o.  Male) Treating RN: Montey Hora Primary Care Physician: Sena Hitch Other Clinician: Referring Physician: Sena Hitch Treating Physician/Extender: Frann Rider in Treatment: 3 Subjective Chief Complaint Information obtained from Patient Patient is at the clinic for treatment of an open pressure ulcer. 54 year old gentleman with a ulcerated area on his left hip she's had for 3 weeks. History of Present Illness (HPI) The following HPI elements were documented for the patient's wound: Location: left hip area Quality: Patient reports experiencing a dull pain to affected  area(s). Severity: Patient states wound are getting worse. Duration: Patient has had the wound for < 3 weeks prior to presenting for treatment Timing: Pain in wound is Intermittent (comes and goes Context: The wound appeared gradually over time Modifying Factors: Other treatment(s) tried include: local dressing changes with antibiotic cream Associated Signs and Symptoms: Patient reports having difficulty standing for long periods. The patient is under treatment with medical oncology Dr. Ma Hillock sees him, for multiple myeloma, anemia, COPD, peripheral neuropathy, pedal edema and has been referred to [place] for left lateral hip pressure ulcer. his past medical history significant for coronary artery disease, dilated cardiomyopathy with an EF of 20% and has had her AICD placement, kidney stones, asthma, COPD, polysubstance abuse, anxiety. continues to smoke a pack of cigarettes a day, history of recreational drug usage and used to take IV drugs in the late 1980s. He was in bed for a long while due to an associated illness and lay on his left side for prolonged periods of time. He has been ambulatory otherwise and has a fairly poor nutrition. Objective Constitutional Pulse regular. Respirations normal and unlabored. Afebrile. Vitals Time Taken: 2:25 PM, Height: 73 in, Weight: 132 lbs, BMI: 17.4, Temperature: 98.3 F, Pulse: 71 bpm, Respiratory Rate: 20 breaths/min, Blood Pressure: 116/71 mmHg. [name], [name] A. ([id number]) Eyes Nonicteric. Reactive to light. Ears, Nose, Mouth, and Throat Lips, teeth, and gums WNL.Marland Kitchen Moist mucosa without lesions . Neck supple and nontender. No palpable supraclavicular or cervical adenopathy. Normal sized without goiter. Respiratory WNL. No retractions.. Breath sounds WNL, No rubs, rales, rhonchi, or wheeze.. Cardiovascular Heart rhythm and rate regular, no murmur or gallop.. Pedal Pulses WNL. No clubbing, cyanosis or edema. Chest Breasts symmetical and no nipple  discharge.. Breast tissue WNL, no masses, lumps, or tenderness.. Lymphatic No adneopathy. No adenopathy. No adenopathy. Musculoskeletal Adexa without tenderness or enlargement.. Digits and nails w/o clubbing, cyanosis, infection, petechiae, ischemia, or inflammatory conditions.Marland Kitchen Psychiatric Judgement and insight Intact.. No evidence of depression, anxiety, or agitation.. General Notes: there is some debris on the left hip area which will be sharply debrided with a curette. Integumentary (Hair, Skin) No suspicious lesions. No crepitus or fluctuance. No peri-wound warmth or erythema. No masses.. Wound #1 status is Open. Original cause of wound was Gradually Appeared. The wound is located on the Left Ischial Tuberosity. The wound measures 2.2cm length x 3.3cm width x 0.1cm depth; 5.702cm^2 area and 0.57cm^3 volume. The wound is limited to skin breakdown. There is no tunneling or undermining noted. There is a small amount of serosanguineous drainage noted. The wound margin is distinct with the outline attached to the wound base. There is no granulation within the wound bed. There is a large (67-100%) amount of necrotic tissue within the wound bed including Adherent Slough. The periwound skin appearance exhibited: Moist, Erythema. The periwound skin appearance did not exhibit: Callus, Crepitus, Excoriation, Fluctuance, Friable, Induration, Localized Edema, Rash, Scarring, Dry/Scaly, Maceration, Atrophie Blanche, Cyanosis, Ecchymosis, Hemosiderin Staining, Mottled, Pallor, Rubor. The surrounding wound  skin color is noted with erythema which is circumferential. Erythema is measured at 7 cm. Periwound temperature was noted as No Abnormality. The periwound has tenderness on palpation. [name], [name] A. ([id number]) Assessment Active Problems ICD-10 [id number] - Pressure ulcer of left hip, stage 3 C90.00 - Multiple myeloma not having achieved remission F17.218 - Nicotine dependence, cigarettes, with  other nicotine-induced disorders E44.1 - Mild protein-calorie malnutrition I have recommended we continue with Santyl ointment locally and cover it with bordered foam and offload this as much as possible and continue to change position. I have also recommended every effort to increase his protein intake, multivitamins and minerals and he and his wife are trying to be most compliant. I have also motivated him to give up smoking and discussed this at length. Procedures Wound #1 Wound #1 is a Malignant Wound located on the Left Ischial Tuberosity . There was a Skin/Subcutaneous Tissue Debridement ([id number]) debridement with total area of 7.26 sq cm performed by Christin Fudge, MD. with the following instrument(s): Curette to remove Viable and Non-Viable tissue/material including Fibrin/Slough and Subcutaneous after achieving pain control using Lidocaine 4% Topical Solution. A time out was conducted prior to the start of the procedure. A Minimum amount of bleeding was controlled with Pressure. The procedure was tolerated well with a pain level of 0 throughout and a pain level of 0 following the procedure. Post Debridement Measurements: 2.2cm length x 3.3cm width x 0.1cm depth; 0.57cm^3 volume. Post procedure Diagnosis Wound #1: Same as Pre-Procedure Plan Wound Cleansing: Wound #1 Left Ischial Tuberosity: Clean wound with Normal Saline. Anesthetic: CALEB, DECOCK A. ([id number]) Wound #1 Left Ischial Tuberosity: Topical Lidocaine 4% cream applied to wound bed prior to debridement Skin Barriers/Peri-Wound Care: Wound #1 Left Ischial Tuberosity: Barrier cream Primary Wound Dressing: Wound #1 Left Ischial Tuberosity: Santyl Ointment Secondary Dressing: Wound #1 Left Ischial Tuberosity: Boardered Foam Dressing Dressing Change Frequency: Wound #1 Left Ischial Tuberosity: Change dressing every day. Follow-up Appointments: Wound #1 Left Ischial Tuberosity: Return Appointment in 1  week. Off-Loading: Wound #1 Left Ischial Tuberosity: Turn and reposition every 2 hours Additional Orders / Instructions: Wound #1 Left Ischial Tuberosity: Stop Smoking Increase protein intake. Other: - obtain vitamins;zinc, vitamin C I have recommended we continue with Santyl ointment locally and cover it with bordered foam and offload this as much as possible and continue to change position. I have also recommended every effort to increase his protein intake, multivitamins and minerals and he and his wife are trying to be most compliant. I have also motivated him to give up smoking and discussed this at length. Electronic Signature(s) Signed: 11/26/2014 2:51:59 PM By: Christin Fudge MD, FACS Entered By: Christin Fudge on 11/26/2014 14:51:58 Lovings, [name] ([id number]) -------------------------------------------------------------------------------- SuperBill Details Patient Name: [name] Date of Service: 11/26/2014 Medical Record Number: [id number] Patient Account Number: [id number] Date of Birth/Sex: 1960-12-31 (54 y.o. Male) Treating RN: Montey Hora Primary Care Physician: Sena Hitch Other Clinician: Referring Physician: Sena Hitch Treating Physician/Extender: Frann Rider in Treatment: 3 Diagnosis Coding ICD-10 Codes Code Description [phone] Pressure ulcer of left hip, stage 3 C90.00 Multiple myeloma not having achieved remission F17.218 Nicotine dependence, cigarettes, with other nicotine-induced disorders E44.1 Mild protein-calorie malnutrition Facility Procedures CPT4 Code Description: [id number] 11042 - DEB SUBQ TISSUE 20 SQ CM/< ICD-10 Description Diagnosis L89.223 Pressure ulcer of left hip, stage 3 C90.00 Multiple myeloma not having achieved remission F17.218 Nicotine dependence, cigarettes, with other nicoti  E44.1 Mild protein-calorie malnutrition Modifier: ne-induced di Quantity: 1 sorders Physician  Procedures CPT4 Code Description:  [id number] 11042 - WC PHYS SUBQ TISS 20 SQ CM ICD-10 Description Diagnosis L89.223 Pressure ulcer of left hip, stage 3 C90.00 Multiple myeloma not having achieved remission F17.218 Nicotine dependence, cigarettes, with other nicoti  E44.1 Mild protein-calorie malnutrition Modifier: ne-induced di Quantity: 1 sorders Electronic Signature(s) Signed: 11/26/2014 2:52:17 PM By: Christin Fudge MD, FACS Entered By: Christin Fudge on 11/26/2014 14:52:17

## 2014-11-27 NOTE — Progress Notes (Signed)
[name] ([id number]) Visit Report for 11/26/2014 Arrival Information Details Patient Name: [name] Date of Service: 11/26/2014 2:15 PM Medical Record Number: [id number] Patient Account Number: [id number] Date of Birth/Sex: 21-May-1960 (53 y.o. Male) Treating RN: Montey Hora Primary Care Physician: Sena Hitch Other Clinician: Referring Physician: Sena Hitch Treating Physician/Extender: Frann Rider in Treatment: 3 Visit Information History Since Last Visit Added or deleted any medications: No Patient Arrived: Ambulatory Any new allergies or adverse reactions: No Arrival Time: 14:23 Had [name] fall or experienced change in No Accompanied By: spouse activities of daily living that may affect Transfer Assistance: None risk of falls: Patient Identification Verified: Yes Signs or symptoms of abuse/neglect since last No Secondary Verification Process Yes visito Completed: Hospitalized since last visit: No Patient Requires Transmission-Based No Pain Present Now: No Precautions: Patient Has Alerts: No Electronic Signature(s) Signed: 11/26/2014 5:32:28 PM By: Montey Hora Entered By: Montey Hora on 11/26/2014 14:24:07 [name] ([id number]) -------------------------------------------------------------------------------- Encounter Discharge Information Details Patient Name: [name] Date of Service: 11/26/2014 2:15 PM Medical Record Number: [id number] Patient Account Number: [id number] Date of Birth/Sex: [birth year] (54 y.o. Male) Treating RN: Montey Hora Primary Care Physician: Sena Hitch Other Clinician: Referring Physician: Sena Hitch Treating Physician/Extender: Frann Rider in Treatment: 3 Encounter Discharge Information Items Discharge Pain Level: 0 Discharge Condition: Stable Ambulatory Status: Ambulatory Discharge Destination: Home Transportation: Private Auto Accompanied By: spouse Schedule  Follow-up Appointment: Yes Medication Reconciliation completed and provided to Patient/Care No [name]: Provided on Clinical Summary of Care: 11/26/2014 Form Type Recipient Paper Patient RM Electronic Signature(s) Signed: 11/26/2014 3:00:08 PM By: Ruthine Dose Entered By: Ruthine Dose on 11/26/2014 15:00:07 [name], [name] AMarland Kitchen ([id number]) -------------------------------------------------------------------------------- Multi Wound Chart Details Patient Name: [name] Date of Service: 11/26/2014 2:15 PM Medical Record Number: [id number] Patient Account Number: [id number] Date of Birth/Sex: Jan 07, 1961 (54 y.o. 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] [N/[name]:N/[name]] Comorbid History: [1:Chronic Obstructive Pulmonary Disease (COPD), Arrhythmia, Hypertension, Received Radiation] [N/[name]:N/[name]] Date Acquired: [1:10/19/2014] [N/[name]:N/[name]] Weeks of Treatment: [1:3] [N/[name]:N/[name]] Wound Status: [1:Open] [N/[name]:N/[name]] Measurements L x W x D 2.2x3.3x0.1 [N/[name]:N/[name]] (cm) Area (cm) : [1:5.702] [N/[name]:N/[name]] Volume (cm) : [1:0.57] [N/[name]:N/[name]] % Reduction in Area: [1:-65.00%] [N/[name]:N/[name]] % Reduction in Volume: -64.70% [N/[name]:N/[name]] Classification: [1:Partial Thickness] [N/[name]:N/[name]] Exudate Amount: [1:Small] [N/[name]:N/[name]] Exudate Type: [1:Serosanguineous] [N/[name]:N/[name]] Exudate Color: [1:red, brown] [N/[name]:N/[name]] Wound Margin: [1:Distinct, outline attached] [N/[name]:N/[name]] Granulation Amount: [1:None Present (0%)] [N/[name]:N/[name]] Necrotic  Amount: [1:Large (67-100%)] [N/[name]:N/[name]] Exposed Structures: [1:Fascia: No Fat: No Tendon: No Muscle: No Joint: No] [N/[name]:N/[name]] Bone: No Limited to Skin Breakdown Epithelialization: Small (1-33%) N/[name] N/[name] Periwound Skin Texture: Edema: No N/[name] N/[name] Excoriation: No Induration: No Callus: No Crepitus: No Fluctuance: No Friable: No Rash: No Scarring: No Periwound Skin Moist: Yes N/[name] N/[name] Moisture: Maceration: No Dry/Scaly: No Periwound Skin Color: Erythema: Yes N/[name] N/[name] Atrophie Blanche: No Cyanosis: No Ecchymosis: No Hemosiderin Staining: No Mottled: No Pallor: No Rubor: No Erythema Location: Circumferential N/[name] N/[name] Erythema Measurement: Measured: 7cm N/[name] N/[name] Temperature: No Abnormality N/[name] N/[name] Tenderness on Yes N/[name] N/[name] Palpation: Wound Preparation: Ulcer Cleansing: N/[name] N/[name] Rinsed/Irrigated with Saline Topical Anesthetic Applied: Other: lidocaine 4% Treatment Notes Electronic Signature(s) Signed: 11/26/2014  5:32:28 PM By: Montey Hora Entered By: Montey Hora on 11/26/2014 14:33:27 [name] ([id number]) -------------------------------------------------------------------------------- [place] Details Patient Name: [name] Date of Service: 11/26/2014 2:15 PM Medical Record Number: [id number] Patient Account Number: [id number] Date of Birth/Sex: 1960/11/11 (53 y.o. Male) Treating RN: Montey Hora Primary Care Physician: Sena Hitch Other Clinician: Referring Physician: Sena Hitch Treating Physician/Extender: Frann Rider in Treatment: 3 Active Inactive Orientation to the Wound Care Program Nursing Diagnoses: Knowledge deficit related to the wound healing center program Goals: Patient/caregiver will verbalize understanding of the [place] Program Date Initiated: 11/19/2014 Goal Status: Active Interventions: Provide education on orientation to the wound center Notes: Pressure Nursing  Diagnoses: Knowledge deficit related to causes and risk factors for pressure ulcer development Goals: Patient will remain free of pressure ulcers Date Initiated: 11/19/2014 Goal Status: Active Interventions: Assess: immobility, friction, shearing, incontinence upon admission and as needed Treatment Activities: Patient referred for seating evaluation to ensure proper offloading : 11/26/2014 Notes: Wound/Skin Impairment Nursing Diagnoses: Impaired tissue integrity [place]z, Julian [name]. ([id number]) Goals: Ulcer/skin breakdown will heal within 14 weeks Date Initiated: 11/19/2014 Goal Status: Active Interventions: Assess ulceration(s) every visit Treatment Activities: Topical wound management initiated : 11/26/2014 Notes: Electronic Signature(s) Signed: 11/26/2014 5:32:28 PM By: Montey Hora Entered By: Montey Hora on 11/26/2014 14:33:20 Wendell, Pearletha [name] ([id number]) -------------------------------------------------------------------------------- Pain Assessment Details Patient Name: [name] Date of Service: 11/26/2014 2:15 PM Medical Record Number: [id number] Patient Account Number: [id number] Date of Birth/Sex: [birth year] (54 y.o. Male) Treating RN: Montey Hora Primary Care Physician: Sena Hitch Other Clinician: Referring Physician: Sena Hitch Treating Physician/Extender: Frann Rider in Treatment: 3 Active Problems Location of Pain Severity and Description of Pain Patient Has Paino Yes Site Locations Pain Location: Generalized Pain With Dressing Change: No Duration of the Pain. Constant / Intermittento Constant Pain Management and Medication Current Pain Management: Electronic Signature(s) Signed: 11/26/2014 5:32:28 PM By: Montey Hora Entered By: Montey Hora on 11/26/2014 14:24:38 Rowlands, Pearletha [name] ([id number]) -------------------------------------------------------------------------------- Patient/Caregiver Education Details Patient  Name: [name] Date of Service: 11/26/2014 2:15 PM Medical Record Number: [id number] Patient Account Number: [id number] Date of Birth/Gender: Oct 17, 1960 (53 y.o. Male) Treating RN: Montey Hora Primary Care Physician: Sena Hitch Other Clinician: Referring Physician: Sena Hitch Treating Physician/Extender: Frann Rider in Treatment: 3 Education Assessment Education Provided To: Patient and Caregiver Education Topics Provided Wound/Skin Impairment: Handouts: Other: continue wound care as ordered Methods: Demonstration, Explain/Verbal Responses: State content correctly Electronic Signature(s) Signed: 11/26/2014 5:32:28 PM By: Montey Hora Entered By: Montey Hora on 11/26/2014 14:59:39 Tasso, Shain AMarland Kitchen ([id number]) -------------------------------------------------------------------------------- Wound Assessment Details Patient Name: [name] Date of Service: 11/26/2014 2:15 PM Medical Record Number: [id number] Patient Account Number: [id number] Date of Birth/Sex: 1960-05-13 (54 y.o. Male) Treating RN: Montey Hora Primary Care Physician: Sena Hitch Other Clinician: Referring Physician: Sena Hitch Treating Physician/Extender: Frann Rider in Treatment: 3 Wound Status Wound Number: 1 Primary Malignant Wound Etiology: Wound Location: Left Ischial Tuberosity Wound Open Wounding Event: Gradually Appeared Status: Date Acquired: 10/19/2014 Comorbid Chronic Obstructive Pulmonary Weeks Of Treatment: 3 History: Disease (COPD), Arrhythmia, Clustered Wound: No Hypertension, Received Radiation Photos Photo Uploaded By: Montey Hora on 11/26/2014 14:40:48 Wound Measurements Length: (cm) 2.2 Width: (cm) 3.3 Depth: (cm) 0.1 Area: (cm) 5.702 Volume: (cm) 0.57 % Reduction in Area: -65% % Reduction in Volume: -64.7% Epithelialization: Small (1-33%) Tunneling: No Undermining: No Wound Description Classification:  Partial Thickness Wound Margin: Distinct, outline attached Exudate Amount: Small Exudate Type: Serosanguineous Exudate Color:  red, brown Foul Odor After Cleansing: No Wound Bed Granulation Amount: None Present (0%) Exposed Structure Necrotic Amount: Large (67-100%) Fascia Exposed: No Necrotic Quality: Adherent Slough Fat Layer Exposed: No Tendon Exposed: No Harvill, Shep [name]. ([id number]) Muscle Exposed: No Joint Exposed: No Bone Exposed: No Limited to Skin Breakdown Periwound Skin Texture Texture Color No Abnormalities Noted: No No Abnormalities Noted: No Callus: No Atrophie Blanche: No Crepitus: No Cyanosis: No Excoriation: No Ecchymosis: No Fluctuance: No Erythema: Yes Friable: No Erythema Location: Circumferential Induration: No Erythema Measurement: Measured Localized Edema: No 7 cm Rash: No Hemosiderin Staining: No Scarring: No Mottled: No Pallor: No Moisture Rubor: No No Abnormalities Noted: No Dry / Scaly: No Temperature / Pain Maceration: No Temperature: No Abnormality Moist: Yes Tenderness on Palpation: Yes Wound Preparation Ulcer Cleansing: Rinsed/Irrigated with Saline Topical Anesthetic Applied: Other: lidocaine 4%, Treatment Notes Wound #1 (Left Ischial Tuberosity) 1. Cleansed with: Clean wound with Normal Saline 2. Anesthetic Topical Lidocaine 4% cream to wound bed prior to debridement 4. Dressing Applied: Santyl Ointment 5. Secondary Dressing Applied Bordered Foam Dressing Dry Gauze Electronic Signature(s) Signed: 11/26/2014 5:32:28 PM By: Montey Hora Entered By: Montey Hora on 11/26/2014 14:33:12 Gaunt, Pearletha [name] ([id number]) -------------------------------------------------------------------------------- Vitals Details Patient Name: [name] Date of Service: 11/26/2014 2:15 PM Medical Record Number: [id number] Patient Account Number: [id number] Date of Birth/Sex: 10/31/1960 (54 y.o. Male) Treating RN: Montey Hora Primary Care Physician: Sena Hitch Other Clinician: Referring Physician: Sena Hitch Treating Physician/Extender: Frann Rider in Treatment: 3 Vital Signs Time Taken: 14:25 Temperature (F): 98.3 Height (in): 73 Pulse (bpm): 71 Weight (lbs): 132 Respiratory Rate (breaths/min): 20 Body Mass Index (BMI): 17.4 Blood Pressure (mmHg): 116/71 Reference Range: 80 - 120 mg / dl Electronic Signature(s) Signed: 11/26/2014 5:32:28 PM By: Montey Hora Entered By: Montey Hora on 11/26/2014 14:26:17

## 2014-12-04 ENCOUNTER — Other Ambulatory Visit: Payer: Self-pay | Admitting: Oncology

## 2014-12-04 ENCOUNTER — Encounter: Payer: Medicaid Other | Admitting: Surgery

## 2014-12-04 DIAGNOSIS — L89223 Pressure ulcer of left hip, stage 3: Secondary | ICD-10-CM | POA: Diagnosis not present

## 2014-12-04 NOTE — Telephone Encounter (Signed)
Pt here and just got through with wound care and having lots of stress and anxiety. He states that pain is not the problem at the site of wound.  The problem is he stays worked up about how he feels, pain sometimes, about eating, cost of medications, family always calling for help, holidays.  I told him I will ask for refill of xanax.  He needs to see PCP to see if he cna have depression pill or something to help with traumatic stress he has.  He willl call md at [facility] and ask for appt .  Also advised if psych consult if PCp feels it is warranted so that he can rest better, stress less.  He and his girlfriend are agreeable and I called in xanax refill per ok with Dr. B but had to get Magda Paganini permission due to pt has medicaid.

## 2014-12-05 NOTE — Progress Notes (Signed)
[name], [name] Visit Report for 12/04/2014 Arrival Information Details Patient Name: [name], [name] Date of Service: 12/04/2014 8:15 AM Medical Record Number: [id number] Patient Account Number: [id number] Date of Birth/Sex: Jan 17, 1961 (53 y.o. Male) Treating RN: Cornell Barman Primary Care Physician: Sena Hitch Other Clinician: Referring Physician: Sena Hitch Treating Physician/Extender: Frann Rider in Treatment: 4 Visit Information History Since Last Visit Added or deleted any medications: No Patient Arrived: Ambulatory Any new allergies or adverse reactions: No Arrival Time: 08:09 Had a fall or experienced change in No Accompanied By: wife activities of daily living that may affect Transfer Assistance: None risk of falls: Patient Identification Verified: Yes Signs or symptoms of abuse/neglect since last No Secondary Verification Process Yes visito Completed: Hospitalized since last visit: No Patient Requires Transmission-Based No Has Dressing in Place as Prescribed: Yes Precautions: Pain Present Now: No Patient Has Alerts: No Electronic Signature(s) Signed: 12/04/2014 4:39:51 PM By: Gretta Cool, RN, BSN, Kim RN, BSN Entered By: Gretta Cool, RN, BSN, Kim on 12/04/2014 08:10:09 [name], [name] -------------------------------------------------------------------------------- Clinic Level of Care Assessment Details Patient Name: [name] Date of Service: 12/04/2014 8:15 AM Medical Record Number: [id number] Patient Account Number: [id number] Date of Birth/Sex: 03/10/1960 (53 y.o. Male) Treating RN: Cornell Barman Primary Care Physician: Sena Hitch Other Clinician: Referring Physician: Sena Hitch Treating Physician/Extender: Frann Rider in Treatment: 4 Clinic Level of Care Assessment Items TOOL 4 Quantity Score []  - Use when only an EandM is performed on FOLLOW-UP visit 0 ASSESSMENTS - Nursing Assessment /  Reassessment []  - Reassessment of Co-morbidities (includes updates in patient status) 0 X - Reassessment of Adherence to Treatment Plan 1 5 ASSESSMENTS - Wound and Skin Assessment / Reassessment X - Simple Wound Assessment / Reassessment - one wound 1 5 []  - Complex Wound Assessment / Reassessment - multiple wounds 0 []  - Dermatologic / Skin Assessment (not related to wound area) 0 ASSESSMENTS - Focused Assessment []  - Circumferential Edema Measurements - multi extremities 0 []  - Nutritional Assessment / Counseling / Intervention 0 []  - Lower Extremity Assessment (monofilament, tuning fork, pulses) 0 []  - Peripheral Arterial Disease Assessment (using hand held doppler) 0 ASSESSMENTS - Ostomy and/or Continence Assessment and Care []  - Incontinence Assessment and Management 0 []  - Ostomy Care Assessment and Management (repouching, etc.) 0 PROCESS - Coordination of Care X - Simple Patient / Family Education for ongoing care 1 15 []  - Complex (extensive) Patient / Family Education for ongoing care 0 []  - Staff obtains Programmer, systems, Records, Test Results / Process Orders 0 []  - Staff telephones HHA, Nursing Homes / Clarify orders / etc 0 []  - Routine Transfer to another Facility (non-emergent condition) 0 [name], [name] []  - Routine Hospital Admission (non-emergent condition) 0 []  - New Admissions / Biomedical engineer / Ordering NPWT, Apligraf, etc. 0 []  - Emergency Hospital Admission (emergent condition) 0 X - Simple Discharge Coordination 1 10 []  - Complex (extensive) Discharge Coordination 0 PROCESS - Special Needs []  - Pediatric / Minor Patient Management 0 []  - Isolation Patient Management 0 []  - Hearing / Language / Visual special needs 0 []  - Assessment of Community assistance (transportation, D/C planning, etc.) 0 []  - Additional assistance / Altered mentation 0 []  - Support Surface(s) Assessment (bed, cushion, seat, etc.) 0 INTERVENTIONS - Wound Cleansing /  Measurement X - Simple Wound Cleansing - one wound 1 5 []  - Complex Wound Cleansing - multiple wounds 0 X - Wound Imaging (photographs - any number of wounds)  1 5 []  - Wound Tracing (instead of photographs) 0 X - Simple Wound Measurement - one wound 1 5 []  - Complex Wound Measurement - multiple wounds 0 INTERVENTIONS - Wound Dressings X - Small Wound Dressing one or multiple wounds 1 10 []  - Medium Wound Dressing one or multiple wounds 0 []  - Large Wound Dressing one or multiple wounds 0 []  - Application of Medications - topical 0 []  - Application of Medications - injection 0 INTERVENTIONS - Miscellaneous []  - External ear exam 0 [name], [name] []  - Specimen Collection (cultures, biopsies, blood, body fluids, etc.) 0 []  - Specimen(s) / Culture(s) sent or taken to Lab for analysis 0 []  - Patient Transfer (multiple staff / Harrel Lemon Lift / Similar devices) 0 []  - Simple Staple / Suture removal (25 or less) 0 []  - Complex Staple / Suture removal (26 or more) 0 []  - Hypo / Hyperglycemic Management (close monitor of Blood Glucose) 0 []  - Ankle / Brachial Index (ABI) - do not check if billed separately 0 X - Vital Signs 1 5 Has the patient been seen at the hospital within the last three years: Yes Total Score: 65 Level Of Care: New/Established - Level 2 Electronic Signature(s) Signed: 12/04/2014 4:39:51 PM By: Gretta Cool, RN, BSN, Kim RN, BSN Entered By: Gretta Cool, RN, BSN, Kim on 12/04/2014 08:21:49 [name], [name] -------------------------------------------------------------------------------- Encounter Discharge Information Details Patient Name: [name] Date of Service: 12/04/2014 8:15 AM Medical Record Number: [id number] Patient Account Number: [id number] Date of Birth/Sex: 23-Jan-1961 (53 y.o. Male) Treating RN: Cornell Barman Primary Care Physician: Sena Hitch Other Clinician: Referring Physician: Sena Hitch Treating Physician/Extender: Frann Rider in Treatment: 4 Encounter Discharge Information Items Discharge Pain Level: 0 Discharge Condition: Stable Ambulatory Status: Ambulatory Discharge Destination: Home Transportation: Private Auto Accompanied By: wife Schedule Follow-up Appointment: Yes Medication Reconciliation completed and provided to Patient/Care Yes Ziere Docken: Provided on Clinical Summary of Care: 12/04/2014 Form Type Recipient Paper Patient RM Electronic Signature(s) Signed: 12/04/2014 4:39:51 PM By: Gretta Cool, RN, BSN, Kim RN, BSN Previous Signature: 12/04/2014 8:22:16 AM Version By: Ruthine Dose Entered By: Gretta Cool RN, BSN, Kim on 12/04/2014 08:22:33 Pronovost, [name] -------------------------------------------------------------------------------- Multi Wound Chart Details Patient Name: [name] Date of Service: 12/04/2014 8:15 AM Medical Record Number: [id number] Patient Account Number: [id number] Date of Birth/Sex: 12-26-1960 (54 y.o. 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] [N/A:N/A] Comorbid History: [1:Chronic Obstructive Pulmonary Disease (COPD), Arrhythmia, Hypertension, Received Radiation] [N/A:N/A] Date Acquired: [1:10/19/2014] [N/A:N/A] Weeks of Treatment: [1:4] [N/A:N/A] Wound Status: [1:Open] [N/A:N/A] Measurements L x W x D 2x3x0.1 [N/A:N/A] (cm) Area (cm) : [1:4.712] [N/A:N/A] Volume (cm) : [1:0.471] [N/A:N/A] % Reduction in Area: [1:-36.30%] [N/A:N/A] %  Reduction in Volume: -36.10% [N/A:N/A] Classification: [1:Partial Thickness] [N/A:N/A] Exudate Amount: [1:Small] [N/A:N/A] Exudate Type: [1:Serosanguineous] [N/A:N/A] Exudate Color: [1:red, brown] [N/A:N/A] Wound Margin: [1:Distinct, outline attached] [N/A:N/A] Granulation Amount: [1:Small (1-33%)] [N/A:N/A] Granulation Quality: [1:Pink] [N/A:N/A] Necrotic Amount: [1:Large (67-100%)] [N/A:N/A] Exposed Structures: [1:Fascia: No Fat: No Tendon: No Muscle: No] [N/A:N/A] Joint: No Bone: No Limited to Skin Breakdown Epithelialization: Small (1-33%) N/A N/A Periwound Skin Texture: Edema: No N/A N/A Excoriation: No Induration: No Callus:  No Crepitus: No Fluctuance: No Friable: No Rash: No Scarring: No Periwound Skin Moist: Yes N/A N/A Moisture: Maceration: No Dry/Scaly: No Periwound Skin Color: Erythema: Yes N/A N/A Atrophie Blanche: No Cyanosis: No Ecchymosis: No Hemosiderin Staining: No Mottled: No Pallor: No Rubor: No Erythema Location: Circumferential N/A N/A Erythema Measurement: Measured: 7cm N/A N/A Temperature: No Abnormality N/A N/A Tenderness on Yes N/A N/A Palpation: Wound Preparation: Ulcer Cleansing: N/A N/A Rinsed/Irrigated with Saline Topical Anesthetic Applied: Other: lidocaine 4% Treatment Notes Electronic Signature(s) Signed: 12/04/2014 4:39:51 PM By: Gretta Cool, RN, BSN, Kim RN, BSN Entered By: Gretta Cool, RN, BSN, Kim on 12/04/2014 08:21:07 [name] -------------------------------------------------------------------------------- [place] Details Patient Name: [name] Date of Service: 12/04/2014 8:15 AM Medical Record Number: [id number] Patient Account Number: [id number] Date of Birth/Sex: 1960-02-12 (53 y.o. Male) Treating RN: Cornell Barman Primary Care Physician: Sena Hitch Other Clinician: Referring Physician: Sena Hitch Treating Physician/Extender: Frann Rider in Treatment:  4 Active Inactive Orientation to the Wound Care Program Nursing Diagnoses: Knowledge deficit related to the wound healing center program Goals: Patient/caregiver will verbalize understanding of the [place] Program Date Initiated: 11/19/2014 Goal Status: Active Interventions: Provide education on orientation to the wound center Notes: Pressure Nursing Diagnoses: Knowledge deficit related to causes and risk factors for pressure ulcer development Goals: Patient will remain free of pressure ulcers Date Initiated: 11/19/2014 Goal Status: Active Interventions: Assess: immobility, friction, shearing, incontinence upon admission and as needed Treatment Activities: Patient referred for seating evaluation to ensure proper offloading : 12/04/2014 Notes: Wound/Skin Impairment Nursing Diagnoses: Impaired tissue integrity Fuentes, Overton A. ([id number]) Goals: Ulcer/skin breakdown will heal within 14 weeks Date Initiated: 11/19/2014 Goal Status: Active Interventions: Assess ulceration(s) every visit Treatment Activities: Topical wound management initiated : 12/04/2014 Notes: Electronic Signature(s) Signed: 12/04/2014 4:39:51 PM By: Gretta Cool, RN, BSN, Kim RN, BSN Entered By: Gretta Cool, RN, BSN, Kim on 12/04/2014 08:21:01 Marsalis, [name] -------------------------------------------------------------------------------- Pain Assessment Details Patient Name: [name] Date of Service: 12/04/2014 8:15 AM Medical Record Number: [id number] Patient Account Number: [id number] Date of Birth/Sex: 07-19-1960 (53 y.o. Male) Treating RN: Cornell Barman Primary Care Physician: Sena Hitch Other Clinician: Referring Physician: Sena Hitch Treating Physician/Extender: Frann Rider in Treatment: 4 Active Problems Location of Pain Severity and Description of Pain Patient Has Paino No Site Locations Pain Management and Medication Current Pain  Management: Electronic Signature(s) Signed: 12/04/2014 4:39:51 PM By: Gretta Cool, RN, BSN, Kim RN, BSN Entered By: Gretta Cool, RN, BSN, Kim on 12/04/2014 08:10:18 Palazzo, [name] -------------------------------------------------------------------------------- Patient/Caregiver Education Details Patient Name: [name] Date of Service: 12/04/2014 8:15 AM Medical Record Number: [id number] Patient Account Number: [id number] Date of Birth/Gender: Jun 01, 1960 (53 y.o. Male) Treating RN: Cornell Barman Primary Care Physician: Sena Hitch Other Clinician: Referring Physician: Sena Hitch Treating Physician/Extender: Frann Rider in Treatment: 4 Education Assessment Education Provided To: Patient Education Topics Provided Wound/Skin Impairment: Handouts: Other: continue wound care as prescribed Methods: Demonstration, Explain/Verbal Responses: State content correctly Electronic Signature(s) Signed: 12/04/2014 4:39:51 PM By: Gretta Cool, RN, BSN, Kim RN, BSN Entered By: Gretta Cool, RN, BSN, Kim on 12/04/2014 08:23:00 Ries, [name] -------------------------------------------------------------------------------- Wound Assessment Details Patient Name: [name] Date of Service: 12/04/2014 8:15 AM Medical Record Number: [id number] Patient Account Number: [id number] Date of Birth/Sex: 1960/11/12 (53 y.o. Male) Treating RN: Cornell Barman Primary Care Physician: Sena Hitch Other Clinician: Referring Physician: Sena Hitch Treating Physician/Extender: Frann Rider in Treatment: 4 Wound Status Wound Number: 1 Primary Malignant Wound Etiology: Wound  Location: Left Ischial Tuberosity Wound Open Wounding Event: Gradually Appeared Status: Date Acquired: 10/19/2014 Comorbid Chronic Obstructive Pulmonary Weeks Of Treatment: 4 History: Disease (COPD), Arrhythmia, Clustered Wound: No Hypertension, Received Radiation Photos Photo Uploaded By:  Gretta Cool, RN, BSN, Kim on 12/04/2014 09:26:48 Wound Measurements Length: (cm) 2 Width: (cm) 3 Depth: (cm) 0.1 Area: (cm) 4.712 Volume: (cm) 0.471 % Reduction in Area: -36.3% % Reduction in Volume: -36.1% Epithelialization: Small (1-33%) Wound Description Classification: Partial Thickness Wound Margin: Distinct, outline attached Exudate Amount: Small Exudate Type: Serosanguineous Exudate Color: red, brown Foul Odor After Cleansing: No Wound Bed Granulation Amount: Small (1-33%) Exposed Structure Granulation Quality: Pink Fascia Exposed: No Necrotic Amount: Large (67-100%) Fat Layer Exposed: No Necrotic Quality: Adherent Slough Tendon Exposed: No Forester, Willman A. ([id number]) Muscle Exposed: No Joint Exposed: No Bone Exposed: No Limited to Skin Breakdown Periwound Skin Texture Texture Color No Abnormalities Noted: No No Abnormalities Noted: No Callus: No Atrophie Blanche: No Crepitus: No Cyanosis: No Excoriation: No Ecchymosis: No Fluctuance: No Erythema: Yes Friable: No Erythema Location: Circumferential Induration: No Erythema Measurement: Measured Localized Edema: No 7 cm Rash: No Hemosiderin Staining: No Scarring: No Mottled: No Pallor: No Moisture Rubor: No No Abnormalities Noted: No Dry / Scaly: No Temperature / Pain Maceration: No Temperature: No Abnormality Moist: Yes Tenderness on Palpation: Yes Wound Preparation Ulcer Cleansing: Rinsed/Irrigated with Saline Topical Anesthetic Applied: Other: lidocaine 4%, Treatment Notes Wound #1 (Left Ischial Tuberosity) 1. Cleansed with: Clean wound with Normal Saline 2. Anesthetic Topical Lidocaine 4% cream to wound bed prior to debridement 4. Dressing Applied: Santyl Ointment 5. Secondary Dressing Applied Bordered Foam Dressing Electronic Signature(s) Signed: 12/04/2014 4:39:51 PM By: Gretta Cool, RN, BSN, Kim RN, BSN Entered By: Gretta Cool, RN, BSN, Kim on 12/04/2014 08:13:53 Hofmann, [name]  ([id number]) -------------------------------------------------------------------------------- Vitals Details Patient Name: [name] Date of Service: 12/04/2014 8:15 AM Medical Record Number: [id number] Patient Account Number: [id number] Date of Birth/Sex: Oct 10, 1960 (53 y.o. Male) Treating RN: Cornell Barman Primary Care Physician: Sena Hitch Other Clinician: Referring Physician: Sena Hitch Treating Physician/Extender: Frann Rider in Treatment: 4 Vital Signs Time Taken: 08:10 Temperature (F): 97.7 Height (in): 73 Pulse (bpm): 79 Weight (lbs): 132 Respiratory Rate (breaths/min): 20 Body Mass Index (BMI): 17.4 Blood Pressure (mmHg): 130/81 Reference Range: 80 - 120 mg / dl Electronic Signature(s) Signed: 12/04/2014 4:39:51 PM By: Gretta Cool, RN, BSN, Kim RN, BSN Entered By: Gretta Cool, RN, BSN, Kim on 12/04/2014 08:10:39

## 2014-12-05 NOTE — Progress Notes (Signed)
[name], [name] ([id number]) Visit Report for 12/04/2014 Chief Complaint Document Details Patient Name: [name], [name] Date of Service: 12/04/2014 8:15 AM Medical Record Number: [id number] Patient Account Number: [id number] Date of Birth/Sex: 1960/12/26 (53 y.o. Male) Treating RN: Cornell Barman Primary Care Physician: Sena Hitch Other Clinician: Referring Physician: Sena Hitch Treating Physician/Extender: Frann Rider in Treatment: 4 Information Obtained from: Patient Chief Complaint Patient is at the clinic for treatment of an open pressure ulcer. 54 year old gentleman with a ulcerated area on his left hip she's had for 3 weeks. Electronic Signature(s) Signed: 12/04/2014 8:22:14 AM By: Christin Fudge MD, FACS Entered By: Christin Fudge on 12/04/2014 08:22:13 [name] ([id number]) -------------------------------------------------------------------------------- HPI Details Patient Name: [name] Date of Service: 12/04/2014 8:15 AM Medical Record Number: [id number] Patient Account Number: [id number] Date of Birth/Sex: Mar 29, 1960 (53 y.o. Male) Treating RN: Cornell Barman Primary Care Physician: Sena Hitch Other Clinician: Referring Physician: Sena Hitch Treating Physician/Extender: Frann Rider in Treatment: 4 History of Present Illness Location: left hip area Quality: Patient reports experiencing a dull pain to affected area(s). Severity: Patient states wound are getting worse. Duration: Patient has had the wound for < 3 weeks prior to presenting for treatment Timing: Pain in wound is Intermittent (comes and goes Context: The wound appeared gradually over time Modifying Factors: Other treatment(s) tried include: local dressing changes with antibiotic cream Associated Signs and Symptoms: Patient reports having difficulty standing for long periods. HPI Description: The patient is under treatment with medical oncology Dr. Ma Hillock sees  him, for multiple myeloma, anemia, COPD, peripheral neuropathy, pedal edema and has been referred to [place] for left lateral hip pressure ulcer. his past medical history significant for coronary artery disease, dilated cardiomyopathy with an EF of 20% and has had her AICD placement, kidney stones, asthma, COPD, polysubstance abuse, anxiety. continues to smoke a pack of cigarettes a day, history of recreational drug usage and used to take IV drugs in the late 1980s. He was in bed for a long while due to an associated illness and lay on his left side for prolonged periods of time. He has been ambulatory otherwise and has a fairly poor nutrition. Electronic Signature(s) Signed: 12/04/2014 8:22:18 AM By: Christin Fudge MD, FACS Entered By: Christin Fudge on 12/04/2014 08:22:18 [name] ([id number]) -------------------------------------------------------------------------------- Physical Exam Details Patient Name: [name] Date of Service: 12/04/2014 8:15 AM Medical Record Number: [id number] Patient Account Number: [id number] Date of Birth/Sex: 1960-11-05 (54 y.o. Male) Treating RN: Cornell Barman Primary Care Physician: Sena Hitch Other Clinician: Referring Physician: Sena Hitch Treating Physician/Extender: Frann Rider in Treatment: 4 Constitutional . Pulse regular. Respirations normal and unlabored. Afebrile. . Eyes Nonicteric. Reactive to light. Ears, Nose, Mouth, and Throat Lips, teeth, and gums WNL.Marland Kitchen Moist mucosa without lesions . Neck supple and nontender. No palpable supraclavicular or cervical adenopathy. Normal sized without goiter. Respiratory WNL. No retractions.. Cardiovascular Pedal Pulses WNL. No clubbing, cyanosis or edema. Chest Breasts symmetical and no nipple discharge.. Breast tissue WNL, no masses, lumps, or tenderness.. Lymphatic No adneopathy. No adenopathy. No adenopathy. Musculoskeletal Adexa without tenderness or enlargement..  Digits and nails w/o clubbing, cyanosis, infection, petechiae, ischemia, or inflammatory conditions.. Integumentary (Hair, Skin) No suspicious lesions. No crepitus or fluctuance. No peri-wound warmth or erythema. No masses.Marland Kitchen Psychiatric Judgement and insight Intact.. No evidence of depression, anxiety, or agitation.. Notes except for a small area in the center which is still covered to slough the rest of the wound looks much cleaner and minimal debris.  The curette was not used today. Electronic Signature(s) Signed: 12/04/2014 8:22:48 AM By: Christin Fudge MD, FACS Entered By: Christin Fudge on 12/04/2014 08:22:48 [name] ([id number]) -------------------------------------------------------------------------------- Physician Orders Details Patient Name: [name] Date of Service: 12/04/2014 8:15 AM Medical Record Number: [id number] Patient Account Number: [id number] Date of Birth/Sex: 02-11-1960 (53 y.o. Male) Treating RN: Cornell Barman Primary Care Physician: Sena Hitch Other Clinician: Referring Physician: Sena Hitch Treating Physician/Extender: Frann Rider in Treatment: 4 Verbal / Phone Orders: Yes Clinician: Cornell Barman Read Back and Verified: Yes Diagnosis Coding Wound Cleansing Wound #1 Left Ischial Tuberosity o Clean wound with Normal Saline. Anesthetic Wound #1 Left Ischial Tuberosity o Topical Lidocaine 4% cream applied to wound bed prior to debridement Skin Barriers/Peri-Wound Care Wound #1 Left Ischial Tuberosity o Barrier cream Primary Wound Dressing Wound #1 Left Ischial Tuberosity o Santyl Ointment Secondary Dressing Wound #1 Left Ischial Tuberosity o Boardered Foam Dressing Dressing Change Frequency Wound #1 Left Ischial Tuberosity o Change dressing every day. Follow-up Appointments Wound #1 Left Ischial Tuberosity o Return Appointment in 1 week. Off-Loading Wound #1 Left Ischial Tuberosity o Turn and  reposition every 2 hours Additional Orders / Instructions Wound #1 Left Ischial Tuberosity [name], [name] A. ([id number]) o Stop Smoking o Increase protein intake. o Other: - obtain vitamins;zinc, vitamin C Electronic Signature(s) Signed: 12/04/2014 4:13:02 PM By: Christin Fudge MD, FACS Signed: 12/04/2014 4:39:51 PM By: Gretta Cool RN, BSN, Kim RN, BSN Entered By: Gretta Cool, RN, BSN, Kim on 12/04/2014 25:05:39 [name] ([id number]) -------------------------------------------------------------------------------- Problem List Details Patient Name: [name] Date of Service: 12/04/2014 8:15 AM Medical Record Number: [id number] Patient Account Number: [id number] Date of Birth/Sex: [birth year] (53 y.o. Male) Treating RN: Cornell Barman Primary Care Physician: Sena Hitch Other Clinician: Referring Physician: Sena Hitch Treating Physician/Extender: Frann Rider in Treatment: 4 Active Problems ICD-10 Encounter Code Description Active Date Diagnosis L89.223 Pressure ulcer of left hip, stage 3 11/05/2014 Yes C90.00 Multiple myeloma not having achieved remission 11/05/2014 Yes F17.218 Nicotine dependence, cigarettes, with other nicotine- 11/05/2014 Yes induced disorders E44.1 Mild protein-calorie malnutrition 11/05/2014 Yes Inactive Problems Resolved Problems Electronic Signature(s) Signed: 12/04/2014 8:22:09 AM By: Christin Fudge MD, FACS Entered By: Christin Fudge on 12/04/2014 08:22:09 [name], [name] ([id number]) -------------------------------------------------------------------------------- Progress Note Details Patient Name: [name] Date of Service: 12/04/2014 8:15 AM Medical Record Number: [id number] Patient Account Number: [id number] Date of Birth/Sex: 1960/01/30 (54 y.o. Male) Treating RN: Cornell Barman Primary Care Physician: Sena Hitch Other Clinician: Referring Physician: Sena Hitch Treating Physician/Extender: Frann Rider in Treatment: 4 Subjective Chief Complaint Information obtained from Patient Patient is at the clinic for treatment of an open pressure ulcer. 54 year old gentleman with a ulcerated area on his left hip she's had for 3 weeks. History of Present Illness (HPI) The following HPI elements were documented for the patient's wound: Location: left hip area Quality: Patient reports experiencing a dull pain to affected area(s). Severity: Patient states wound are getting worse. Duration: Patient has had the wound for < 3 weeks prior to presenting for treatment Timing: Pain in wound is Intermittent (comes and goes Context: The wound appeared gradually over time Modifying Factors: Other treatment(s) tried include: local dressing changes with antibiotic cream Associated Signs and Symptoms: Patient reports having difficulty standing for long periods. The patient is under treatment with medical oncology Dr. Ma Hillock sees him, for multiple myeloma, anemia, COPD, peripheral neuropathy, pedal edema and has been referred to [place] for left lateral hip pressure ulcer. his past  medical history significant for coronary artery disease, dilated cardiomyopathy with an EF of 20% and has had her AICD placement, kidney stones, asthma, COPD, polysubstance abuse, anxiety. continues to smoke a pack of cigarettes a day, history of recreational drug usage and used to take IV drugs in the late 1980s. He was in bed for a long while due to an associated illness and lay on his left side for prolonged periods of time. He has been ambulatory otherwise and has a fairly poor nutrition. Objective Constitutional Pulse regular. Respirations normal and unlabored. Afebrile. Vitals Time Taken: 8:10 AM, Height: 73 in, Weight: 132 lbs, BMI: 17.4, Temperature: 97.7 F, Pulse: 79 bpm, Respiratory Rate: 20 breaths/min, Blood Pressure: 130/81 mmHg. [name], [name] A. ([id number]) Eyes Nonicteric. Reactive to light. Ears, Nose, Mouth,  and Throat Lips, teeth, and gums WNL.Marland Kitchen Moist mucosa without lesions . Neck supple and nontender. No palpable supraclavicular or cervical adenopathy. Normal sized without goiter. Respiratory WNL. No retractions.. Cardiovascular Pedal Pulses WNL. No clubbing, cyanosis or edema. Chest Breasts symmetical and no nipple discharge.. Breast tissue WNL, no masses, lumps, or tenderness.. Lymphatic No adneopathy. No adenopathy. No adenopathy. Musculoskeletal Adexa without tenderness or enlargement.. Digits and nails w/o clubbing, cyanosis, infection, petechiae, ischemia, or inflammatory conditions.Marland Kitchen Psychiatric Judgement and insight Intact.. No evidence of depression, anxiety, or agitation.. General Notes: except for a small area in the center which is still covered to slough the rest of the wound looks much cleaner and minimal debris. The curette was not used today. Integumentary (Hair, Skin) No suspicious lesions. No crepitus or fluctuance. No peri-wound warmth or erythema. No masses.. Wound #1 status is Open. Original cause of wound was Gradually Appeared. The wound is located on the Left Ischial Tuberosity. The wound measures 2cm length x 3cm width x 0.1cm depth; 4.712cm^2 area and 0.471cm^3 volume. The wound is limited to skin breakdown. There is a small amount of serosanguineous drainage noted. The wound margin is distinct with the outline attached to the wound base. There is small (1-33%) pink granulation within the wound bed. There is a large (67-100%) amount of necrotic tissue within the wound bed including Adherent Slough. The periwound skin appearance exhibited: Moist, Erythema. The periwound skin appearance did not exhibit: Callus, Crepitus, Excoriation, Fluctuance, Friable, Induration, Localized Edema, Rash, Scarring, Dry/Scaly, Maceration, Atrophie Blanche, Cyanosis, Ecchymosis, Hemosiderin Staining, Mottled, Pallor, Rubor. The surrounding wound skin color is noted with  erythema which is circumferential. Erythema is measured at 7 cm. Periwound temperature was noted as No Abnormality. The periwound has tenderness on palpation. [name], [name] A. ([id number]) Assessment Active Problems ICD-10 [phone] - Pressure ulcer of left hip, stage 3 C90.00 - Multiple myeloma not having achieved remission F17.218 - Nicotine dependence, cigarettes, with other nicotine-induced disorders E44.1 - Mild protein-calorie malnutrition I have recommended we continue with Santyl ointment locally and have again addressed nutrition, smoking cessation and adequate offloading. Plan Wound Cleansing: Wound #1 Left Ischial Tuberosity: Clean wound with Normal Saline. Anesthetic: Wound #1 Left Ischial Tuberosity: Topical Lidocaine 4% cream applied to wound bed prior to debridement Skin Barriers/Peri-Wound Care: Wound #1 Left Ischial Tuberosity: Barrier cream Primary Wound Dressing: Wound #1 Left Ischial Tuberosity: Santyl Ointment Secondary Dressing: Wound #1 Left Ischial Tuberosity: Boardered Foam Dressing Dressing Change Frequency: Wound #1 Left Ischial Tuberosity: Change dressing every day. Follow-up Appointments: Wound #1 Left Ischial Tuberosity: Return Appointment in 1 week. Off-Loading: Wound #1 Left Ischial Tuberosity: Turn and reposition every 2 hours Additional Orders / Instructions: Wound #1 Left Ischial Tuberosity: Stop Smoking  [name], [name] A. ([id number]) Increase protein intake. Other: - obtain vitamins;zinc, vitamin C I have recommended we continue with Santyl ointment locally and have again addressed nutrition, smoking cessation and adequate offloading. Electronic Signature(s) Signed: 12/04/2014 8:23:18 AM By: Christin Fudge MD, FACS Entered By: Christin Fudge on 12/04/2014 08:23:18 [name] ([id number]) -------------------------------------------------------------------------------- SuperBill Details Patient Name: [name] Date of Service:  12/04/2014 Medical Record Number: [id number] Patient Account Number: [id number] Date of Birth/Sex: 13-Oct-1960 (53 y.o. Male) Treating RN: Cornell Barman Primary Care Physician: Sena Hitch Other Clinician: Referring Physician: Sena Hitch Treating Physician/Extender: Frann Rider in Treatment: 4 Diagnosis Coding ICD-10 Codes Code Description [phone] Pressure ulcer of left hip, stage 3 C90.00 Multiple myeloma not having achieved remission F17.218 Nicotine dependence, cigarettes, with other nicotine-induced disorders E44.1 Mild protein-calorie malnutrition Facility Procedures CPT4 Code: [id number] Description: 03212 - WOUND CARE VISIT-LEV 2 EST PT Modifier: Quantity: 1 Physician Procedures CPT4 Code Description: [id number] 99213 - WC PHYS LEVEL 3 - EST PT ICD-10 Description Diagnosis L89.223 Pressure ulcer of left hip, stage 3 C90.00 Multiple myeloma not having achieved remission F17.218 Nicotine dependence, cigarettes, with other nicoti  E44.1 Mild protein-calorie malnutrition Modifier: ne-induced dis Quantity: 1 orders Electronic Signature(s) Signed: 12/04/2014 8:23:33 AM By: Christin Fudge MD, FACS Entered By: Christin Fudge on 12/04/2014 08:23:33

## 2014-12-09 ENCOUNTER — Other Ambulatory Visit: Payer: Self-pay | Admitting: *Deleted

## 2014-12-09 ENCOUNTER — Inpatient Hospital Stay: Payer: Medicaid Other

## 2014-12-09 VITALS — BP 90/67 | HR 71 | Temp 97.0°F | Resp 20

## 2014-12-09 DIAGNOSIS — C9000 Multiple myeloma not having achieved remission: Secondary | ICD-10-CM | POA: Diagnosis not present

## 2014-12-09 LAB — CBC WITH DIFFERENTIAL/PLATELET
BASOS ABS: 0 10*3/uL (ref 0–0.1)
BASOS PCT: 1 %
EOS ABS: 0.3 10*3/uL (ref 0–0.7)
EOS PCT: 4 %
HCT: 36.4 % — ABNORMAL LOW (ref 40.0–52.0)
Hemoglobin: 11.9 g/dL — ABNORMAL LOW (ref 13.0–18.0)
LYMPHS PCT: 17 %
Lymphs Abs: 1.3 10*3/uL (ref 1.0–3.6)
MCH: 31.5 pg (ref 26.0–34.0)
MCHC: 32.7 g/dL (ref 32.0–36.0)
MCV: 96.2 fL (ref 80.0–100.0)
Monocytes Absolute: 0.6 10*3/uL (ref 0.2–1.0)
Monocytes Relative: 7 %
Neutro Abs: 5.4 10*3/uL (ref 1.4–6.5)
Neutrophils Relative %: 71 %
PLATELETS: 216 10*3/uL (ref 150–440)
RBC: 3.78 MIL/uL — AB (ref 4.40–5.90)
RDW: 18.1 % — ABNORMAL HIGH (ref 11.5–14.5)
WBC: 7.6 10*3/uL (ref 3.8–10.6)

## 2014-12-09 LAB — BASIC METABOLIC PANEL
ANION GAP: 5 (ref 5–15)
BUN: 17 mg/dL (ref 6–20)
CALCIUM: 8.7 mg/dL — AB (ref 8.9–10.3)
CO2: 30 mmol/L (ref 22–32)
Chloride: 100 mmol/L — ABNORMAL LOW (ref 101–111)
Creatinine, Ser: 0.83 mg/dL (ref 0.61–1.24)
GFR calc Af Amer: >60 mL/min (ref >60)
GLUCOSE: 103 mg/dL — AB (ref 65–99)
POTASSIUM: 4.1 mmol/L (ref 3.5–5.1)
Sodium: 135 mmol/L (ref 135–145)

## 2014-12-09 MED ORDER — FENTANYL 25 MCG/HR TD PT72: MEDICATED_PATCH | TRANSDERMAL | Status: DC

## 2014-12-09 MED ORDER — FENTANYL 100 MCG/HR TD PT72: MEDICATED_PATCH | TRANSDERMAL | Status: DC

## 2014-12-09 MED ORDER — FENTANYL 12 MCG/HR TD PT72: MEDICATED_PATCH | TRANSDERMAL | Status: DC

## 2014-12-09 MED ORDER — ZOLEDRONIC ACID 4 MG/100ML IV SOLN
4.0000 mg | Freq: Once | INTRAVENOUS | Status: AC
  Administered 2014-12-09: 4 mg via INTRAVENOUS
  Filled 2014-12-09: qty 100

## 2014-12-11 ENCOUNTER — Ambulatory Visit: Payer: Medicaid Other | Admitting: Surgery

## 2014-12-14 ENCOUNTER — Encounter: Payer: Medicaid Other | Admitting: Surgery

## 2014-12-14 DIAGNOSIS — L89223 Pressure ulcer of left hip, stage 3: Secondary | ICD-10-CM | POA: Diagnosis not present

## 2014-12-15 NOTE — Progress Notes (Signed)
TINY, RIETZ ([id number]) Visit Report for 12/14/2014 Chief Complaint Document Details Patient Name: [name], [name] 12/14/2014 10:00 Date of Service: AM Medical Record [id number] Number: Patient Account Number: [id number] 1960/05/04 (54 y.o. Treating RN: Cornell Barman Date of Birth/Sex: Male) Other Clinician: Primary Care Physician: Sena Hitch Treating Christin Fudge Referring Physician: Sena Hitch Physician/Extender: Weeks in Treatment: 5 Information Obtained from: Patient Chief Complaint Patient is at the clinic for treatment of an open pressure ulcer. 54 year old gentleman with a ulcerated area on his left hip she's had for 3 weeks. Electronic Signature(s) Signed: 12/14/2014 10:18:41 AM By: Christin Fudge MD, FACS Entered By: Christin Fudge on 12/14/2014 10:18:41 Ko Vaya, [name] ([id number]) -------------------------------------------------------------------------------- Debridement Details Patient Name: [name] Form A. 12/14/2014 10:00 Date of Service: AM Medical Record [id number] Number: Patient Account Number: [id number] 1961/01/13 (54 y.o. Treating RN: Cornell Barman Date of Birth/Sex: Male) Other Clinician: Primary Care Physician: Cora Collum, Dirk Vanaman Referring Physician: Sena Hitch Physician/Extender: Weeks in Treatment: 5 Debridement Performed for Wound #1 Left Ischial Tuberosity Assessment: Performed By: Physician Christin Fudge, MD Debridement: Debridement Pre-procedure Yes Verification/Time Out Taken: Start Time: 10:13 Pain Control: Other : Lidocaine 4% Level: Skin/Subcutaneous Tissue Total Area Debrided (L x 1.6 (cm) x 1.2 (cm) = 1.92 (cm) W): Tissue and other Viable, Non-Viable, Exudate, Fibrin/Slough, Subcutaneous material debrided: Instrument: Curette Bleeding: Minimum Hemostasis Achieved: Pressure End Time: 10:15 Procedural Pain: 9 Post Procedural Pain: 9 Response to Treatment: Procedure was tolerated  well Post Debridement Measurements of Total Wound Length: (cm) 1.6 Width: (cm) 1.2 Depth: (cm) 0.2 Volume: (cm) 0.302 Post Procedure Diagnosis Same as Pre-procedure Electronic Signature(s) Signed: 12/14/2014 10:18:36 AM By: Christin Fudge MD, FACS Signed: 12/14/2014 4:48:54 PM By: Gretta Cool RN, BSN, Kim RN, BSN Entered By: Christin Fudge on 12/14/2014 10:18:36 Goonan, [name] ([id number]) -------------------------------------------------------------------------------- HPI Details Patient Name: [name] Form A. 12/14/2014 10:00 Date of Service: AM Medical Record [id number] Number: Patient Account Number: [id number] August 16, 1960 (54 y.o. Treating RN: Cornell Barman Date of Birth/Sex: Male) Other Clinician: Primary Care Physician: Sena Hitch Treating Christin Fudge Referring Physician: Sena Hitch Physician/Extender: Weeks in Treatment: 5 History of Present Illness Location: left hip area Quality: Patient reports experiencing a dull pain to affected area(s). Severity: Patient states wound are getting worse. Duration: Patient has had the wound for < 3 weeks prior to presenting for treatment Timing: Pain in wound is Intermittent (comes and goes Context: The wound appeared gradually over time Modifying Factors: Other treatment(s) tried include: local dressing changes with antibiotic cream Associated Signs and Symptoms: Patient reports having difficulty standing for long periods. HPI Description: The patient is under treatment with medical oncology Dr. Ma Hillock sees him, for multiple myeloma, anemia, COPD, peripheral neuropathy, pedal edema and has been referred to [place] for left lateral hip pressure ulcer. his past medical history significant for coronary artery disease, dilated cardiomyopathy with an EF of 20% and has had her AICD placement, kidney stones, asthma, COPD, polysubstance abuse, anxiety. continues to smoke a pack of cigarettes a day, history of recreational drug usage and  used to take IV drugs in the late 1980s. He was in bed for a long while due to an associated illness and lay on his left side for prolonged periods of time. He has been ambulatory otherwise and has a fairly poor nutrition. Electronic Signature(s) Signed: 12/14/2014 10:18:46 AM By: Christin Fudge MD, FACS Entered By: Christin Fudge on 12/14/2014 10:18:45 Loudon, [name] ([id number]) -------------------------------------------------------------------------------- Physical Exam Details Patient Name: [name] Form A. 12/14/2014 10:00 Date of Service:  AM Medical Record [id number] Number: Patient Account Number: [id number] 1960-06-07 (54 y.o. Treating RN: Cornell Barman Date of Birth/Sex: Male) Other Clinician: Primary Care Physician: Sena Hitch Treating Christin Fudge Referring Physician: Sena Hitch Physician/Extender: Weeks in Treatment: 5 Constitutional . Pulse regular. Respirations normal and unlabored. Afebrile. . Eyes Nonicteric. Reactive to light. Ears, Nose, Mouth, and Throat Lips, teeth, and gums WNL.Marland Kitchen Moist mucosa without lesions . Neck supple and nontender. No palpable supraclavicular or cervical adenopathy. Normal sized without goiter. Respiratory WNL. No retractions.. Cardiovascular Pedal Pulses WNL. No clubbing, cyanosis or edema. Chest Breasts symmetical and no nipple discharge.. Breast tissue WNL, no masses, lumps, or tenderness.. Lymphatic No adneopathy. No adenopathy. No adenopathy. Musculoskeletal Adexa without tenderness or enlargement.. Digits and nails w/o clubbing, cyanosis, infection, petechiae, ischemia, or inflammatory conditions.. Integumentary (Hair, Skin) No suspicious lesions. No crepitus or fluctuance. No peri-wound warmth or erythema. No masses.Marland Kitchen Psychiatric Judgement and insight Intact.. No evidence of depression, anxiety, or agitation.. Notes The medial part of the wound still has significant slough and I have sharply debrided with a  curette. The lateral area has some healthy granulation tissue. Electronic Signature(s) Signed: 12/14/2014 10:19:18 AM By: Christin Fudge MD, FACS Entered By: Christin Fudge on 12/14/2014 10:19:17 [name] ([id number]) -------------------------------------------------------------------------------- Physician Orders Details Patient Name: [name] Form A. 12/14/2014 10:00 Date of Service: AM Medical Record [id number] Number: Patient Account Number: [id number] [birth year] (54 y.o. Treating RN: Cornell Barman Date of Birth/Sex: Male) Other Clinician: Primary Care Physician: Sena Hitch Treating Christin Fudge Referring Physician: Sena Hitch Physician/Extender: Suella Grove in Treatment: 5 Verbal / Phone Orders: Yes Clinician: Cornell Barman Read Back and Verified: Yes Diagnosis Coding Wound Cleansing Wound #1 Left Ischial Tuberosity o Clean wound with Normal Saline. Anesthetic Wound #1 Left Ischial Tuberosity o Topical Lidocaine 4% cream applied to wound bed prior to debridement Skin Barriers/Peri-Wound Care Wound #1 Left Ischial Tuberosity o Barrier cream Primary Wound Dressing Wound #1 Left Ischial Tuberosity o Santyl Ointment Secondary Dressing Wound #1 Left Ischial Tuberosity o Boardered Foam Dressing Dressing Change Frequency Wound #1 Left Ischial Tuberosity o Change dressing every day. Follow-up Appointments Wound #1 Left Ischial Tuberosity o Return Appointment in 1 week. Off-Loading Wound #1 Left Ischial Tuberosity o Turn and reposition every 2 hours Ybanez, Garion A. ([id number]) Additional Orders / Instructions Wound #1 Left Ischial Tuberosity o Stop Smoking o Increase protein intake. o Other: - obtain vitamins;zinc, vitamin C Electronic Signature(s) Signed: 12/14/2014 4:11:13 PM By: Christin Fudge MD, FACS Signed: 12/15/2014 9:38:48 AM By: Alric Quan Entered By: Alric Quan on 12/14/2014 10:17:14 Avedisian, [name]  ([id number]) -------------------------------------------------------------------------------- Problem List Details Patient Name: JAMINE, HIGHFILL A. 12/14/2014 10:00 Date of Service: AM Medical Record [id number] Number: Patient Account Number: [id number] 1960-12-21 (54 y.o. Treating RN: Cornell Barman Date of Birth/Sex: Male) Other Clinician: Primary Care Physician: Sena Hitch Treating Christin Fudge Referring Physician: Sena Hitch Physician/Extender: Weeks in Treatment: 5 Active Problems ICD-10 Encounter Code Description Active Date Diagnosis L89.223 Pressure ulcer of left hip, stage 3 11/05/2014 Yes C90.00 Multiple myeloma not having achieved remission 11/05/2014 Yes F17.218 Nicotine dependence, cigarettes, with other nicotine- 11/05/2014 Yes induced disorders E44.1 Mild protein-calorie malnutrition 11/05/2014 Yes Inactive Problems Resolved Problems Electronic Signature(s) Signed: 12/14/2014 10:18:23 AM By: Christin Fudge MD, FACS Entered By: Christin Fudge on 12/14/2014 10:18:23 Burrous, [name] ([id number]) -------------------------------------------------------------------------------- Progress Note Details Patient Name: [name] Form A. 12/14/2014 10:00 Date of Service: AM Medical Record [id number] Number: Patient Account Number: [id number] [birth year] (54 y.o. Treating RN: Cornell Barman Date of Birth/Sex:  Male) Other Clinician: Primary Care Physician: Sena Hitch Treating Christin Fudge Referring Physician: Sena Hitch Physician/Extender: Suella Grove in Treatment: 5 Subjective Chief Complaint Information obtained from Patient Patient is at the clinic for treatment of an open pressure ulcer. 54 year old gentleman with a ulcerated area on his left hip she's had for 3 weeks. History of Present Illness (HPI) The following HPI elements were documented for the patient's wound: Location: left hip area Quality: Patient reports experiencing a dull pain to affected  area(s). Severity: Patient states wound are getting worse. Duration: Patient has had the wound for < 3 weeks prior to presenting for treatment Timing: Pain in wound is Intermittent (comes and goes Context: The wound appeared gradually over time Modifying Factors: Other treatment(s) tried include: local dressing changes with antibiotic cream Associated Signs and Symptoms: Patient reports having difficulty standing for long periods. The patient is under treatment with medical oncology Dr. Ma Hillock sees him, for multiple myeloma, anemia, COPD, peripheral neuropathy, pedal edema and has been referred to [place] for left lateral hip pressure ulcer. his past medical history significant for coronary artery disease, dilated cardiomyopathy with an EF of 20% and has had her AICD placement, kidney stones, asthma, COPD, polysubstance abuse, anxiety. continues to smoke a pack of cigarettes a day, history of recreational drug usage and used to take IV drugs in the late 1980s. He was in bed for a long while due to an associated illness and lay on his left side for prolonged periods of time. He has been ambulatory otherwise and has a fairly poor nutrition. Objective Constitutional Pulse regular. Respirations normal and unlabored. Afebrile. Dykes, Jada A. ([id number]) Vitals Time Taken: 10:04 AM, Height: 73 in, Weight: 132 lbs, BMI: 17.4, Temperature: 98.0 F, Pulse: 74 bpm, Respiratory Rate: 18 breaths/min, Blood Pressure: 137/98 mmHg. Eyes Nonicteric. Reactive to light. Ears, Nose, Mouth, and Throat Lips, teeth, and gums WNL.Marland Kitchen Moist mucosa without lesions . Neck supple and nontender. No palpable supraclavicular or cervical adenopathy. Normal sized without goiter. Respiratory WNL. No retractions.. Cardiovascular Pedal Pulses WNL. No clubbing, cyanosis or edema. Chest Breasts symmetical and no nipple discharge.. Breast tissue WNL, no masses, lumps, or tenderness.. Lymphatic No adneopathy. No adenopathy.  No adenopathy. Musculoskeletal Adexa without tenderness or enlargement.. Digits and nails w/o clubbing, cyanosis, infection, petechiae, ischemia, or inflammatory conditions.Marland Kitchen Psychiatric Judgement and insight Intact.. No evidence of depression, anxiety, or agitation.. General Notes: The medial part of the wound still has significant slough and I have sharply debrided with a curette. The lateral area has some healthy granulation tissue. Integumentary (Hair, Skin) No suspicious lesions. No crepitus or fluctuance. No peri-wound warmth or erythema. No masses.. Wound #1 status is Open. Original cause of wound was Gradually Appeared. The wound is located on the Left Ischial Tuberosity. The wound measures 1.6cm length x 1.2cm width x 0.1cm depth; 1.508cm^2 area and 0.151cm^3 volume. The wound is limited to skin breakdown. There is no tunneling or undermining noted. There is a small amount of serosanguineous drainage noted. The wound margin is distinct with the outline attached to the wound base. There is small (1-33%) pink granulation within the wound bed. There is a large (67-100%) amount of necrotic tissue within the wound bed including Adherent Slough. The periwound skin appearance exhibited: Moist, Erythema. The periwound skin appearance did not exhibit: Callus, Crepitus, Excoriation, Fluctuance, Friable, Induration, Localized Edema, Rash, Scarring, Dry/Scaly, Maceration, Atrophie Blanche, Cyanosis, Ecchymosis, Hemosiderin Staining, Mottled, Pallor, Rubor. The surrounding wound skin color is noted with erythema which is circumferential. Erythema is  measured at 7 cm. Periwound temperature was noted as No Abnormality. The periwound has tenderness on palpation. MARCIAL, PLESS A. ([id number]) Assessment Active Problems ICD-10 [phone] - Pressure ulcer of left hip, stage 3 C90.00 - Multiple myeloma not having achieved remission F17.218 - Nicotine dependence, cigarettes, with other nicotine-induced  disorders E44.1 - Mild protein-calorie malnutrition I have recommended we continue with Santyl ointment locally and have again addressed nutrition, smoking cessation and adequate offloading. Procedures Wound #1 Wound #1 is a Malignant Wound located on the Left Ischial Tuberosity . There was a Skin/Subcutaneous Tissue Debridement ([id number]) debridement with total area of 1.92 sq cm performed by Christin Fudge, MD. with the following instrument(s): Curette to remove Viable and Non-Viable tissue/material including Exudate, Fibrin/Slough, and Subcutaneous after achieving pain control using Other (Lidocaine 4%). A time out was conducted prior to the start of the procedure. A Minimum amount of bleeding was controlled with Pressure. The procedure was tolerated well with a pain level of 9 throughout and a pain level of 9 following the procedure. Post Debridement Measurements: 1.6cm length x 1.2cm width x 0.2cm depth; 0.302cm^3 volume. Post procedure Diagnosis Wound #1: Same as Pre-Procedure Plan Wound Cleansing: Wound #1 Left Ischial Tuberosity: Clean wound with Normal Saline. Anesthetic: Wound #1 Left Ischial Tuberosity: Topical Lidocaine 4% cream applied to wound bed prior to debridement Skin Barriers/Peri-Wound Care: Wound #1 Left Ischial Tuberosity: Shreffler, Tanyon A. ([id number]) Barrier cream Primary Wound Dressing: Wound #1 Left Ischial Tuberosity: Santyl Ointment Secondary Dressing: Wound #1 Left Ischial Tuberosity: Boardered Foam Dressing Dressing Change Frequency: Wound #1 Left Ischial Tuberosity: Change dressing every day. Follow-up Appointments: Wound #1 Left Ischial Tuberosity: Return Appointment in 1 week. Off-Loading: Wound #1 Left Ischial Tuberosity: Turn and reposition every 2 hours Additional Orders / Instructions: Wound #1 Left Ischial Tuberosity: Stop Smoking Increase protein intake. Other: - obtain vitamins;zinc, vitamin C I have recommended we continue with  Santyl ointment locally and have again addressed nutrition, smoking cessation and adequate offloading. Electronic Signature(s) Signed: 12/14/2014 10:19:31 AM By: Christin Fudge MD, FACS Entered By: Christin Fudge on 12/14/2014 10:19:31 Quackenbush, [name] ([id number]) -------------------------------------------------------------------------------- SuperBill Details Patient Name: [name] Date of Service: 12/14/2014 Medical Record Number: [id number] Patient Account Number: [id number] Date of Birth/Sex: July 14, 1960 (54 y.o. Male) Treating RN: Cornell Barman Primary Care Physician: Sena Hitch Other Clinician: Referring Physician: Sena Hitch Treating Physician/Extender: Frann Rider in Treatment: 5 Diagnosis Coding ICD-10 Codes Code Description [phone] Pressure ulcer of left hip, stage 3 C90.00 Multiple myeloma not having achieved remission F17.218 Nicotine dependence, cigarettes, with other nicotine-induced disorders E44.1 Mild protein-calorie malnutrition Facility Procedures CPT4 Code Description: [id number] - DEB SUBQ TISSUE 20 SQ CM/< ICD-10 Description Diagnosis L89.223 Pressure ulcer of left hip, stage 3 C90.00 Multiple myeloma not having achieved remission F17.218 Nicotine dependence, cigarettes, with other nicoti Modifier: ne-induced di Quantity: 1 sorders Physician Procedures CPT4 Code Description: [id number] - WC PHYS SUBQ TISS 20 SQ CM ICD-10 Description Diagnosis L89.223 Pressure ulcer of left hip, stage 3 C90.00 Multiple myeloma not having achieved remission F17.218 Nicotine dependence, cigarettes, with other nicoti Modifier: ne-induced di Quantity: 1 sorders Electronic Signature(s) Signed: 12/14/2014 10:19:43 AM By: Christin Fudge MD, FACS Entered By: Christin Fudge on 12/14/2014 10:19:43

## 2014-12-15 NOTE — Progress Notes (Signed)
DISHON, GLYMPH ([id number]) Visit Report for 12/14/2014 Arrival Information Details Patient Name: [name], [name] Date of Service: 12/14/2014 10:00 AM Medical Record Number: [id number] Patient Account Number: [id number] Date of Birth/Sex: 10/21/1960 (54 y.o. Male) Treating RN: Cornell Barman Primary Care Physician: Sena Hitch Other Clinician: Referring Physician: Sena Hitch Treating Physician/Extender: Frann Rider in Treatment: 5 Visit Information History Since Last Visit All ordered tests and consults were completed: No Patient Arrived: Ambulatory Added or deleted any medications: No Arrival Time: 10:02 Any new allergies or adverse reactions: No Accompanied By: wife Had a fall or experienced change in No Transfer Assistance: None activities of daily living that may affect Patient Identification Verified: Yes risk of falls: Secondary Verification Process Yes Signs or symptoms of abuse/neglect since last No Completed: visito Patient Requires Transmission-Based No Hospitalized since last visit: No Precautions: Pain Present Now: Yes Patient Has Alerts: No Electronic Signature(s) Signed: 12/15/2014 9:38:48 AM By: Alric Quan Entered By: Alric Quan on 12/14/2014 10:03:14 Murnane, [name] ([id number]) -------------------------------------------------------------------------------- Encounter Discharge Information Details Patient Name: [name] Date of Service: 12/14/2014 10:00 AM Medical Record Number: [id number] Patient Account Number: [id number] Date of Birth/Sex: Jul 25, 1960 (54 y.o. Male) Treating RN: Cornell Barman Primary Care Physician: Sena Hitch Other Clinician: Referring Physician: Sena Hitch Treating Physician/Extender: Frann Rider in Treatment: 5 Encounter Discharge Information Items Discharge Pain Level: 9 Discharge Condition: Stable Ambulatory Status: Ambulatory Discharge Destination: Home Transportation:  Private Auto Accompanied By: wife Schedule Follow-up Appointment: Yes Medication Reconciliation completed and provided to Patient/Care No [place]: Provided on Clinical Summary of Care: 12/14/2014 Form Type Recipient Paper Patient RM Electronic Signature(s) Signed: 12/14/2014 10:22:21 AM By: Ruthine Dose Entered By: Ruthine Dose on 12/14/2014 10:22:21 Heinle, [name] ([id number]) -------------------------------------------------------------------------------- Lower Extremity Assessment Details Patient Name: [name] Date of Service: 12/14/2014 10:00 AM Medical Record Number: [id number] Patient Account Number: [id number] Date of Birth/Sex: 1960-06-28 (54 y.o. Male) Treating RN: Cornell Barman Primary Care Physician: Sena Hitch Other Clinician: Referring Physician: Sena Hitch Treating Physician/Extender: Frann Rider in Treatment: 5 Electronic Signature(s) Signed: 12/14/2014 4:48:54 PM By: Gretta Cool RN, BSN, Kim RN, BSN Signed: 12/15/2014 9:38:48 AM By: Alric Quan Entered By: Alric Quan on 12/14/2014 10:07:32 Gorr, [name] ([id number]) -------------------------------------------------------------------------------- Multi Wound Chart Details Patient Name: [name] Date of Service: 12/14/2014 10:00 AM Medical Record Number: [id number] Patient Account Number: [id number] Date of Birth/Sex: March 03, 1960 (54 y.o. Male) Treating RN: Cornell Barman Primary Care Physician: Sena Hitch Other Clinician: Referring Physician: Sena Hitch Treating Physician/Extender: Frann Rider in Treatment: 5 Vital Signs Height(in): 73 Pulse(bpm): 74 Weight(lbs): 132 Blood Pressure 137/98 (mmHg): Body Mass Index(BMI): 17 Temperature(F): 98.0 Respiratory Rate 18 (breaths/min): Photos: [1:No Photos] [N/A:N/A] Wound Location: [1:Left Ischial Tuberosity] [N/A:N/A] Wounding Event: [1:Gradually Appeared] [N/A:N/A] Primary Etiology:  [1:Malignant Wound] [N/A:N/A] Comorbid History: [1:Chronic Obstructive Pulmonary Disease (COPD), Arrhythmia, Hypertension, Received Radiation] [N/A:N/A] Date Acquired: [1:10/19/2014] [N/A:N/A] Weeks of Treatment: [1:5] [N/A:N/A] Wound Status: [1:Open] [N/A:N/A] Measurements L x W x D 1.6x1.2x0.1 [N/A:N/A] (cm) Area (cm) : [1:1.508] [N/A:N/A] Volume (cm) : [1:0.151] [N/A:N/A] % Reduction in Area: [1:56.40%] [N/A:N/A] % Reduction in Volume: 56.40% [N/A:N/A] Classification: [1:Partial Thickness] [N/A:N/A] Exudate Amount: [1:Small] [N/A:N/A] Exudate Type: [1:Serosanguineous] [N/A:N/A] Exudate Color: [1:red, brown] [N/A:N/A] Wound Margin: [1:Distinct, outline attached] [N/A:N/A] Granulation Amount: [1:Small (1-33%)] [N/A:N/A] Granulation Quality: [1:Pink] [N/A:N/A] Necrotic Amount: [1:Large (67-100%)] [N/A:N/A] Exposed Structures: [1:Fascia: No Fat: No Tendon: No Muscle: No] [N/A:N/A] Joint: No Bone: No Limited to Skin Breakdown Epithelialization: Small (1-33%) N/A N/A Debridement: Debridement [id number] N/A  N/A 11047) Time-Out Taken: Yes N/A N/A Pain Control: Other N/A N/A Tissue Debrided: Fibrin/Slough, Exudates, N/A N/A Subcutaneous Level: Skin/Subcutaneous N/A N/A Tissue Debridement Area (sq 1.92 N/A N/A cm): Instrument: Curette N/A N/A Bleeding: Minimum N/A N/A Hemostasis Achieved: Pressure N/A N/A Procedural Pain: 9 N/A N/A Post Procedural Pain: 9 N/A N/A Debridement Treatment Procedure was tolerated N/A N/A Response: well Post Debridement 1.6x1.2x0.2 N/A N/A Measurements L x W x D (cm) Post Debridement 0.302 N/A N/A Volume: (cm) Periwound Skin Texture: Edema: No N/A N/A Excoriation: No Induration: No Callus: No Crepitus: No Fluctuance: No Friable: No Rash: No Scarring: No Periwound Skin Moist: Yes N/A N/A Moisture: Maceration: No Dry/Scaly: No Periwound Skin Color: Erythema: Yes N/A N/A Atrophie Blanche: No Cyanosis: No Ecchymosis: No Hemosiderin  Staining: No Mottled: No Pallor: No Rubor: No Erythema Location: Circumferential N/A N/A Erythema Measurement: Measured: 7cm N/A N/A Temperature: No Abnormality N/A N/A Sult, Biff A. ([id number]) Tenderness on Yes N/A N/A Palpation: Wound Preparation: Ulcer Cleansing: N/A N/A Rinsed/Irrigated with Saline Topical Anesthetic Applied: Other: lidocaine 4% Procedures Performed: Debridement N/A N/A Treatment Notes Electronic Signature(s) Signed: 12/15/2014 9:38:48 AM By: Alric Quan Entered By: Alric Quan on 12/14/2014 10:15:35 Kass, [name] ([id number]) -------------------------------------------------------------------------------- [place] Details Patient Name: [name] Date of Service: 12/14/2014 10:00 AM Medical Record Number: [id number] Patient Account Number: [id number] Date of Birth/Sex: 01/19/1961 (54 y.o. Male) Treating RN: Cornell Barman Primary Care Physician: Sena Hitch Other Clinician: Referring Physician: Sena Hitch Treating Physician/Extender: Frann Rider in Treatment: 5 Active Inactive Orientation to the Wound Care Program Nursing Diagnoses: Knowledge deficit related to the wound healing center program Goals: Patient/caregiver will verbalize understanding of the [facility] Program Date Initiated: 11/19/2014 Goal Status: Active Interventions: Provide education on orientation to the wound center Notes: Pressure Nursing Diagnoses: Knowledge deficit related to causes and risk factors for pressure ulcer development Goals: Patient will remain free of pressure ulcers Date Initiated: 11/19/2014 Goal Status: Active Interventions: Assess: immobility, friction, shearing, incontinence upon admission and as needed Treatment Activities: Patient referred for seating evaluation to ensure proper offloading : 12/14/2014 Notes: Wound/Skin Impairment Nursing Diagnoses: Impaired tissue  integrity Guerin, Karina A. ([id number]) Goals: Ulcer/skin breakdown will heal within 14 weeks Date Initiated: 11/19/2014 Goal Status: Active Interventions: Assess ulceration(s) every visit Treatment Activities: Topical wound management initiated : 12/14/2014 Notes: Electronic Signature(s) Signed: 12/14/2014 4:48:54 PM By: Gretta Cool, RN, BSN, Kim RN, BSN Signed: 12/15/2014 9:38:48 AM By: Alric Quan Entered By: Alric Quan on 12/14/2014 10:10:00 Costin, [name] ([id number]) -------------------------------------------------------------------------------- Pain Assessment Details Patient Name: [name] Date of Service: 12/14/2014 10:00 AM Medical Record Number: [id number] Patient Account Number: [id number] Date of Birth/Sex: 1960-10-19 (54 y.o. Male) Treating RN: Cornell Barman Primary Care Physician: Sena Hitch Other Clinician: Referring Physician: Sena Hitch Treating Physician/Extender: Frann Rider in Treatment: 5 Active Problems Location of Pain Severity and Description of Pain Patient Has Paino Yes Site Locations Pain Location: Generalized Pain Duration of the Pain. Constant / Intermittento Intermittent Rate the pain. Current Pain Level: 9 Worst Pain Level: 10 Least Pain Level: 0 Character of Pain Describe the Pain: Aching Pain Management and Medication Current Pain Management: Electronic Signature(s) Signed: 12/14/2014 4:48:54 PM By: Gretta Cool RN, BSN, Kim RN, BSN Signed: 12/15/2014 9:38:48 AM By: Alric Quan Entered By: Alric Quan on 12/14/2014 10:04:18 Maietta, [name] ([id number]) -------------------------------------------------------------------------------- Patient/Caregiver Education Details Patient Name: [name] Date of Service: 12/14/2014 10:00 AM Medical Record Number: [id number] Patient Account Number: [id number] Date of  Birth/Gender: Nov 11, 1960 (54 y.o. Male) Treating RN: Cornell Barman Primary Care Physician:  Sena Hitch Other Clinician: Referring Physician: Sena Hitch Treating Physician/Extender: Frann Rider in Treatment: 5 Education Assessment Education Provided To: Patient and Caregiver Education Topics Provided Wound/Skin Impairment: Handouts: Other: follow wound care orders as prescribed Methods: Demonstration, Explain/Verbal Responses: State content correctly Electronic Signature(s) Signed: 12/15/2014 9:38:48 AM By: Alric Quan Entered By: Alric Quan on 12/14/2014 10:19:43 Arutyunyan, Keaton AMarland Kitchen ([id number]) -------------------------------------------------------------------------------- Wound Assessment Details Patient Name: [name] Date of Service: 12/14/2014 10:00 AM Medical Record Number: [id number] Patient Account Number: [id number] Date of Birth/Sex: 30-Oct-1960 (54 y.o. Male) Treating RN: Cornell Barman Primary Care Physician: Sena Hitch Other Clinician: Referring Physician: Sena Hitch Treating Physician/Extender: Frann Rider in Treatment: 5 Wound Status Wound Number: 1 Primary Malignant Wound Etiology: Wound Location: Left Ischial Tuberosity Wound Open Wounding Event: Gradually Appeared Status: Date Acquired: 10/19/2014 Comorbid Chronic Obstructive Pulmonary Weeks Of Treatment: 5 History: Disease (COPD), Arrhythmia, Clustered Wound: No Hypertension, Received Radiation Photos Photo Uploaded By: Gretta Cool, RN, BSN, Kim on 12/14/2014 17:08:35 Wound Measurements Length: (cm) 1.6 Width: (cm) 1.2 Depth: (cm) 0.1 Area: (cm) 1.508 Volume: (cm) 0.151 % Reduction in Area: 56.4% % Reduction in Volume: 56.4% Epithelialization: Small (1-33%) Tunneling: No Undermining: No Wound Description Classification: Partial Thickness Wound Margin: Distinct, outline attached Exudate Amount: Small Exudate Type: Serosanguineous Exudate Color: red, brown Foul Odor After Cleansing: No Wound Bed Granulation Amount: Small (1-33%)  Exposed Structure Granulation Quality: Pink Fascia Exposed: No Necrotic Amount: Large (67-100%) Fat Layer Exposed: No Necrotic Quality: Adherent Slough Tendon Exposed: No Ellerbrock, Dagmawi A. ([id number]) Muscle Exposed: No Joint Exposed: No Bone Exposed: No Limited to Skin Breakdown Periwound Skin Texture Texture Color No Abnormalities Noted: No No Abnormalities Noted: No Callus: No Atrophie Blanche: No Crepitus: No Cyanosis: No Excoriation: No Ecchymosis: No Fluctuance: No Erythema: Yes Friable: No Erythema Location: Circumferential Induration: No Erythema Measurement: Measured Localized Edema: No 7 cm Rash: No Hemosiderin Staining: No Scarring: No Mottled: No Pallor: No Moisture Rubor: No No Abnormalities Noted: No Dry / Scaly: No Temperature / Pain Maceration: No Temperature: No Abnormality Moist: Yes Tenderness on Palpation: Yes Wound Preparation Ulcer Cleansing: Rinsed/Irrigated with Saline Topical Anesthetic Applied: Other: lidocaine 4%, Treatment Notes Wound #1 (Left Ischial Tuberosity) 1. Cleansed with: Clean wound with Normal Saline 2. Anesthetic Topical Lidocaine 4% cream to wound bed prior to debridement 4. Dressing Applied: Santyl Ointment 5. Secondary Dressing Applied Bordered Foam Dressing Electronic Signature(s) Signed: 12/14/2014 4:48:54 PM By: Gretta Cool RN, BSN, Kim RN, BSN Signed: 12/15/2014 9:38:48 AM By: Alric Quan Entered By: Alric Quan on 12/14/2014 10:09:14 Fees, [name] ([id number]) -------------------------------------------------------------------------------- Vitals Details Patient Name: [name] Date of Service: 12/14/2014 10:00 AM Medical Record Number: [id number] Patient Account Number: [id number] Date of Birth/Sex: 01-01-1961 (54 y.o. Male) Treating RN: Cornell Barman Primary Care Physician: Sena Hitch Other Clinician: Referring Physician: Sena Hitch Treating Physician/Extender: Frann Rider in Treatment: 5 Vital Signs Time Taken: 10:04 Temperature (F): 98.0 Height (in): 73 Pulse (bpm): 74 Weight (lbs): 132 Respiratory Rate (breaths/min): 18 Body Mass Index (BMI): 17.4 Blood Pressure (mmHg): 137/98 Reference Range: 80 - 120 mg / dl Electronic Signature(s) Signed: 12/15/2014 9:38:48 AM By: Alric Quan Entered By: Alric Quan on 12/14/2014 10:05:18

## 2014-12-23 ENCOUNTER — Other Ambulatory Visit: Payer: Self-pay

## 2014-12-23 ENCOUNTER — Inpatient Hospital Stay: Payer: Medicaid Other

## 2014-12-23 ENCOUNTER — Emergency Department: Payer: Medicaid Other

## 2014-12-23 ENCOUNTER — Encounter: Payer: Self-pay | Admitting: Emergency Medicine

## 2014-12-23 ENCOUNTER — Other Ambulatory Visit: Payer: Self-pay | Admitting: *Deleted

## 2014-12-23 ENCOUNTER — Inpatient Hospital Stay
Admission: EM | Admit: 2014-12-23 | Discharge: 2014-12-27 | DRG: 871 | Disposition: A | Discharge status: Home / Self Care | Payer: Medicaid Other | Attending: Internal Medicine | Admitting: Internal Medicine

## 2014-12-23 ENCOUNTER — Encounter: Payer: Self-pay | Admitting: *Deleted

## 2014-12-23 DIAGNOSIS — L97129 Non-pressure chronic ulcer of left thigh with unspecified severity: Secondary | ICD-10-CM | POA: Diagnosis present

## 2014-12-23 DIAGNOSIS — R404 Transient alteration of awareness: Secondary | ICD-10-CM | POA: Diagnosis not present

## 2014-12-23 DIAGNOSIS — J209 Acute bronchitis, unspecified: Secondary | ICD-10-CM | POA: Diagnosis present

## 2014-12-23 DIAGNOSIS — J440 Chronic obstructive pulmonary disease with acute lower respiratory infection: Secondary | ICD-10-CM | POA: Diagnosis present

## 2014-12-23 DIAGNOSIS — R509 Fever, unspecified: Secondary | ICD-10-CM | POA: Diagnosis not present

## 2014-12-23 DIAGNOSIS — J441 Chronic obstructive pulmonary disease with (acute) exacerbation: Secondary | ICD-10-CM | POA: Diagnosis present

## 2014-12-23 DIAGNOSIS — Z79899 Other long term (current) drug therapy: Secondary | ICD-10-CM

## 2014-12-23 DIAGNOSIS — R6520 Severe sepsis without septic shock: Secondary | ICD-10-CM | POA: Diagnosis not present

## 2014-12-23 DIAGNOSIS — F419 Anxiety disorder, unspecified: Secondary | ICD-10-CM | POA: Diagnosis present

## 2014-12-23 DIAGNOSIS — N289 Disorder of kidney and ureter, unspecified: Secondary | ICD-10-CM | POA: Diagnosis present

## 2014-12-23 DIAGNOSIS — G9341 Metabolic encephalopathy: Secondary | ICD-10-CM | POA: Diagnosis present

## 2014-12-23 DIAGNOSIS — E440 Moderate protein-calorie malnutrition: Secondary | ICD-10-CM | POA: Insufficient documentation

## 2014-12-23 DIAGNOSIS — A419 Sepsis, unspecified organism: Secondary | ICD-10-CM | POA: Diagnosis not present

## 2014-12-23 DIAGNOSIS — IMO0001 Reserved for inherently not codable concepts without codable children: Secondary | ICD-10-CM | POA: Insufficient documentation

## 2014-12-23 DIAGNOSIS — R4189 Other symptoms and signs involving cognitive functions and awareness: Secondary | ICD-10-CM

## 2014-12-23 DIAGNOSIS — R6889 Other general symptoms and signs: Secondary | ICD-10-CM | POA: Diagnosis present

## 2014-12-23 DIAGNOSIS — L03012 Cellulitis of left finger: Secondary | ICD-10-CM | POA: Diagnosis present

## 2014-12-23 DIAGNOSIS — Z7951 Long term (current) use of inhaled steroids: Secondary | ICD-10-CM | POA: Diagnosis not present

## 2014-12-23 DIAGNOSIS — G894 Chronic pain syndrome: Secondary | ICD-10-CM | POA: Diagnosis present

## 2014-12-23 DIAGNOSIS — C9000 Multiple myeloma not having achieved remission: Secondary | ICD-10-CM

## 2014-12-23 DIAGNOSIS — J9601 Acute respiratory failure with hypoxia: Secondary | ICD-10-CM | POA: Diagnosis present

## 2014-12-23 DIAGNOSIS — Z716 Tobacco abuse counseling: Secondary | ICD-10-CM | POA: Diagnosis not present

## 2014-12-23 DIAGNOSIS — L8990 Pressure ulcer of unspecified site, unspecified stage: Secondary | ICD-10-CM | POA: Diagnosis not present

## 2014-12-23 DIAGNOSIS — I959 Hypotension, unspecified: Secondary | ICD-10-CM

## 2014-12-23 DIAGNOSIS — R739 Hyperglycemia, unspecified: Secondary | ICD-10-CM | POA: Diagnosis present

## 2014-12-23 DIAGNOSIS — Z7982 Long term (current) use of aspirin: Secondary | ICD-10-CM | POA: Diagnosis not present

## 2014-12-23 DIAGNOSIS — D899 Disorder involving the immune mechanism, unspecified: Secondary | ICD-10-CM | POA: Diagnosis not present

## 2014-12-23 HISTORY — DX: Multiple myeloma not having achieved remission: C90.00

## 2014-12-23 LAB — CBC
HCT: 37.2 % — ABNORMAL LOW (ref 40.0–52.0)
Hemoglobin: 12.2 g/dL — ABNORMAL LOW (ref 13.0–18.0)
MCH: 31.3 pg (ref 26.0–34.0)
MCHC: 32.8 g/dL (ref 32.0–36.0)
MCV: 95.2 fL (ref 80.0–100.0)
PLATELETS: 221 10*3/uL (ref 150–440)
RBC: 3.91 MIL/uL — ABNORMAL LOW (ref 4.40–5.90)
RDW: 17.4 % — AB (ref 11.5–14.5)
WBC: 5.4 10*3/uL (ref 3.8–10.6)

## 2014-12-23 LAB — LACTIC ACID, PLASMA
Lactic Acid, Venous: 1 mmol/L (ref 0.5–2.0)
Lactic Acid, Venous: 1.2 mmol/L (ref 0.5–2.0)

## 2014-12-23 LAB — CBC WITH DIFFERENTIAL/PLATELET
BASOS ABS: 0 10*3/uL (ref 0–0.1)
Basophils Relative: 1 %
Eosinophils Absolute: 0.1 10*3/uL (ref 0–0.7)
Eosinophils Relative: 2 %
HEMATOCRIT: 36.5 % — AB (ref 40.0–52.0)
HEMOGLOBIN: 11.9 g/dL — AB (ref 13.0–18.0)
LYMPHS PCT: 7 %
Lymphs Abs: 0.4 10*3/uL — ABNORMAL LOW (ref 1.0–3.6)
MCH: 31.2 pg (ref 26.0–34.0)
MCHC: 32.6 g/dL (ref 32.0–36.0)
MCV: 95.7 fL (ref 80.0–100.0)
MONO ABS: 0.8 10*3/uL (ref 0.2–1.0)
MONOS PCT: 14 %
NEUTROS ABS: 4.3 10*3/uL (ref 1.4–6.5)
NEUTROS PCT: 76 %
Platelets: 212 10*3/uL (ref 150–440)
RBC: 3.82 MIL/uL — ABNORMAL LOW (ref 4.40–5.90)
RDW: 17.3 % — AB (ref 11.5–14.5)
WBC: 5.6 10*3/uL (ref 3.8–10.6)

## 2014-12-23 LAB — BASIC METABOLIC PANEL
Anion gap: 8 (ref 5–15)
BUN: 19 mg/dL (ref 6–20)
CHLORIDE: 103 mmol/L (ref 101–111)
CO2: 27 mmol/L (ref 22–32)
CREATININE: 1.35 mg/dL — AB (ref 0.61–1.24)
Calcium: 8.3 mg/dL — ABNORMAL LOW (ref 8.9–10.3)
GFR calc Af Amer: >60 mL/min (ref >60)
GFR calc non Af Amer: 57 mL/min — ABNORMAL LOW (ref >60)
Glucose, Bld: 123 mg/dL — ABNORMAL HIGH (ref 65–99)
Potassium: 3.8 mmol/L (ref 3.5–5.1)
SODIUM: 138 mmol/L (ref 135–145)

## 2014-12-23 LAB — URIC ACID: Uric Acid, Serum: 3.9 mg/dL — ABNORMAL LOW (ref 4.4–7.6)

## 2014-12-23 LAB — HEPATIC FUNCTION PANEL
ALK PHOS: 39 U/L (ref 38–126)
ALT: 30 U/L (ref 17–63)
AST: 68 U/L — ABNORMAL HIGH (ref 15–41)
Albumin: 3.2 g/dL — ABNORMAL LOW (ref 3.5–5.0)
BILIRUBIN INDIRECT: 0.1 mg/dL — AB (ref 0.3–0.9)
BILIRUBIN TOTAL: 0.2 mg/dL — AB (ref 0.3–1.2)
Bilirubin, Direct: 0.1 mg/dL (ref 0.1–0.5)
Total Protein: 6.6 g/dL (ref 6.5–8.1)

## 2014-12-23 LAB — MRSA PCR SCREENING: MRSA BY PCR: NEGATIVE

## 2014-12-23 LAB — MAGNESIUM: Magnesium: 1.6 mg/dL — ABNORMAL LOW (ref 1.7–2.4)

## 2014-12-23 LAB — LIPASE, BLOOD: LIPASE: 14 U/L (ref 11–51)

## 2014-12-23 LAB — PHOSPHORUS: Phosphorus: 3.9 mg/dL (ref 2.5–4.6)

## 2014-12-23 LAB — TROPONIN I: Troponin I: 0.03 ng/mL (ref <0.031)

## 2014-12-23 MED ORDER — GABAPENTIN 100 MG PO CAPS
100.0000 mg | ORAL_CAPSULE | Freq: Three times a day (TID) | ORAL | Status: DC
  Administered 2014-12-23 – 2014-12-27 (×11): 100 mg via ORAL
  Filled 2014-12-23 (×11): qty 1

## 2014-12-23 MED ORDER — ATORVASTATIN CALCIUM 20 MG PO TABS
20.0000 mg | ORAL_TABLET | Freq: Every day | ORAL | Status: DC
  Administered 2014-12-24 – 2014-12-27 (×4): 20 mg via ORAL
  Filled 2014-12-23 (×4): qty 1

## 2014-12-23 MED ORDER — CETYLPYRIDINIUM CHLORIDE 0.05 % MT LIQD
7.0000 mL | Freq: Two times a day (BID) | OROMUCOSAL | Status: DC
  Administered 2014-12-23 – 2014-12-26 (×7): 7 mL via OROMUCOSAL

## 2014-12-23 MED ORDER — NICOTINE 21 MG/24HR TD PT24
21.0000 mg | MEDICATED_PATCH | Freq: Every day | TRANSDERMAL | Status: DC
  Filled 2014-12-23 (×3): qty 1

## 2014-12-23 MED ORDER — SODIUM CHLORIDE 0.9 % IV SOLN
INTRAVENOUS | Status: DC
  Administered 2014-12-23 – 2014-12-26 (×3): via INTRAVENOUS

## 2014-12-23 MED ORDER — POLYETHYLENE GLYCOL 3350 17 G PO PACK
17.0000 g | PACK | Freq: Every day | ORAL | Status: DC
  Administered 2014-12-24 – 2014-12-25 (×2): 17 g via ORAL
  Filled 2014-12-23 (×4): qty 1

## 2014-12-23 MED ORDER — LEVOFLOXACIN IN D5W 750 MG/150ML IV SOLN
750.0000 mg | Freq: Once | INTRAVENOUS | Status: AC
  Administered 2014-12-23: 750 mg via INTRAVENOUS
  Filled 2014-12-23: qty 150

## 2014-12-23 MED ORDER — DOCUSATE SODIUM 100 MG PO CAPS
100.0000 mg | ORAL_CAPSULE | Freq: Two times a day (BID) | ORAL | Status: DC
Start: 2014-12-23 — End: 2014-12-27
  Administered 2014-12-23 – 2014-12-26 (×7): 100 mg via ORAL
  Filled 2014-12-23 (×8): qty 1

## 2014-12-23 MED ORDER — ENOXAPARIN SODIUM 40 MG/0.4ML ~~LOC~~ SOLN
40.0000 mg | SUBCUTANEOUS | Status: DC
  Administered 2014-12-23 – 2014-12-26 (×4): 40 mg via SUBCUTANEOUS
  Filled 2014-12-23 (×4): qty 0.4

## 2014-12-23 MED ORDER — ALBUTEROL SULFATE (2.5 MG/3ML) 0.083% IN NEBU
5.0000 mg | INHALATION_SOLUTION | Freq: Once | RESPIRATORY_TRACT | Status: AC
  Administered 2014-12-23: 5 mg via RESPIRATORY_TRACT
  Filled 2014-12-23: qty 6

## 2014-12-23 MED ORDER — PIPERACILLIN-TAZOBACTAM 3.375 G IVPB
3.3750 g | Freq: Once | INTRAVENOUS | Status: AC
  Administered 2014-12-23: 3.375 g via INTRAVENOUS
  Filled 2014-12-23: qty 50

## 2014-12-23 MED ORDER — VANCOMYCIN HCL IN DEXTROSE 1-5 GM/200ML-% IV SOLN
1000.0000 mg | Freq: Once | INTRAVENOUS | Status: AC
  Administered 2014-12-23: 1000 mg via INTRAVENOUS
  Filled 2014-12-23: qty 200

## 2014-12-23 MED ORDER — METHYLPREDNISOLONE SODIUM SUCC 125 MG IJ SOLR
125.0000 mg | Freq: Once | INTRAMUSCULAR | Status: AC
  Administered 2014-12-23: 125 mg via INTRAVENOUS

## 2014-12-23 MED ORDER — ACETAMINOPHEN 325 MG PO TABS: 650.0000 mg | ORAL_TABLET | Freq: Four times a day (QID) | ORAL | Status: DC | PRN

## 2014-12-23 MED ORDER — PROMETHAZINE HCL 25 MG PO TABS: 25.0000 mg | ORAL_TABLET | ORAL | Status: DC | PRN

## 2014-12-23 MED ORDER — HYDROCORTISONE NA SUCCINATE PF 100 MG IJ SOLR: 100.0000 mg | Freq: Once | INTRAMUSCULAR | Status: DC

## 2014-12-23 MED ORDER — IPRATROPIUM-ALBUTEROL 0.5-2.5 (3) MG/3ML IN SOLN
3.0000 mL | Freq: Once | RESPIRATORY_TRACT | Status: AC
  Administered 2014-12-23: 3 mL via RESPIRATORY_TRACT
  Filled 2014-12-23: qty 3

## 2014-12-23 MED ORDER — SODIUM CHLORIDE 0.9 % IJ SOLN
3.0000 mL | Freq: Two times a day (BID) | INTRAMUSCULAR | Status: DC
  Administered 2014-12-23 – 2014-12-26 (×7): 3 mL via INTRAVENOUS

## 2014-12-23 MED ORDER — ONDANSETRON HCL 4 MG PO TABS: 4.0000 mg | ORAL_TABLET | Freq: Four times a day (QID) | ORAL | Status: DC | PRN

## 2014-12-23 MED ORDER — METHYLPREDNISOLONE SODIUM SUCC 125 MG IJ SOLR
INTRAMUSCULAR | Status: AC
  Administered 2014-12-23: 125 mg via INTRAVENOUS
  Filled 2014-12-23: qty 2

## 2014-12-23 MED ORDER — AMIODARONE HCL 200 MG PO TABS
200.0000 mg | ORAL_TABLET | Freq: Every day | ORAL | Status: DC
Start: 2014-12-23 — End: 2014-12-27
  Administered 2014-12-24 – 2014-12-27 (×4): 200 mg via ORAL
  Filled 2014-12-23 (×4): qty 1

## 2014-12-23 MED ORDER — VANCOMYCIN HCL IN DEXTROSE 750-5 MG/150ML-% IV SOLN
750.0000 mg | INTRAVENOUS | Status: DC
  Administered 2014-12-23: 750 mg via INTRAVENOUS
  Filled 2014-12-23 (×2): qty 150

## 2014-12-23 MED ORDER — ACETAMINOPHEN 650 MG RE SUPP: 650.0000 mg | Freq: Four times a day (QID) | RECTAL | Status: DC | PRN

## 2014-12-23 MED ORDER — OXYCODONE HCL 5 MG PO TABS
5.0000 mg | ORAL_TABLET | ORAL | Status: DC | PRN
  Administered 2014-12-24 – 2014-12-25 (×4): 5 mg via ORAL
  Filled 2014-12-23 (×4): qty 1

## 2014-12-23 MED ORDER — ASPIRIN EC 325 MG PO TBEC
325.0000 mg | DELAYED_RELEASE_TABLET | Freq: Every day | ORAL | Status: DC
  Administered 2014-12-24 – 2014-12-27 (×4): 325 mg via ORAL
  Filled 2014-12-23 (×4): qty 1

## 2014-12-23 MED ORDER — SODIUM CHLORIDE 0.9 % IV BOLUS (SEPSIS)
1830.0000 mL | Freq: Once | INTRAVENOUS | Status: AC
  Administered 2014-12-23: 1830 mL via INTRAVENOUS

## 2014-12-23 MED ORDER — DEXTROSE 5 % IV SOLN: 2.0000 ug/min | INTRAVENOUS | Status: DC

## 2014-12-23 MED ORDER — ONDANSETRON HCL 4 MG/2ML IJ SOLN: 4.0000 mg | Freq: Four times a day (QID) | INTRAMUSCULAR | Status: DC | PRN

## 2014-12-23 MED ORDER — HYDROCORTISONE NA SUCCINATE PF 100 MG IJ SOLR
100.0000 mg | Freq: Three times a day (TID) | INTRAMUSCULAR | Status: DC
  Administered 2014-12-23 – 2014-12-25 (×6): 100 mg via INTRAVENOUS
  Filled 2014-12-23 (×6): qty 2

## 2014-12-23 MED ORDER — ALBUTEROL SULFATE (2.5 MG/3ML) 0.083% IN NEBU
2.5000 mg | INHALATION_SOLUTION | RESPIRATORY_TRACT | Status: DC
  Administered 2014-12-23 – 2014-12-25 (×8): 2.5 mg via RESPIRATORY_TRACT
  Filled 2014-12-23 (×9): qty 3

## 2014-12-23 MED ORDER — PIPERACILLIN-TAZOBACTAM 3.375 G IVPB
3.3750 g | Freq: Three times a day (TID) | INTRAVENOUS | Status: DC
  Administered 2014-12-23 – 2014-12-24 (×2): 3.375 g via INTRAVENOUS
  Filled 2014-12-23 (×4): qty 50

## 2014-12-23 MED ORDER — LEVOFLOXACIN IN D5W 500 MG/100ML IV SOLN
500.0000 mg | INTRAVENOUS | Status: DC
  Administered 2014-12-24: 500 mg via INTRAVENOUS
  Filled 2014-12-23 (×2): qty 100

## 2014-12-23 MED ORDER — TIOTROPIUM BROMIDE MONOHYDRATE 18 MCG IN CAPS
18.0000 ug | ORAL_CAPSULE | Freq: Every day | RESPIRATORY_TRACT | Status: DC
  Administered 2014-12-24 – 2014-12-27 (×4): 18 ug via RESPIRATORY_TRACT
  Filled 2014-12-23: qty 5

## 2014-12-23 NOTE — ED Notes (Signed)
Lab called regarding multiple lab add on, will add on at this time

## 2014-12-23 NOTE — H&P (Addendum)
[facility] [place] NAME: [name]    MR#:  [id number]  DATE OF BIRTH:  [birth year]  DATE OF ADMISSION:  12/23/2014  PRIMARY CARE PHYSICIAN: Petra Kuba, MD   REQUESTING/REFERRING PHYSICIAN:   CHIEF COMPLAINT:   Chief Complaint  Patient presents with  . Cough  . Fatigue  . Code Sepsis    HISTORY OF PRESENT ILLNESS: [name]  is a 54 y.o. male with a known history of multiple myeloma who presents to the hospital with complaints of fevers, chills, cough for the past 3 days with green phlegm, wheezing since last night feeling weak and tired. " The patient is very sleepy and very weak. I'm not able to wake him up and all history is obtained from his girlfriend, who is at bedside. On arrival to emergency room, patient was found to be hypotensive with systolic blood pressure in 80s but not tachycardic. His oxygen saturation was found to be 92-98 % on 3 L of oxygen through nasal cannula. Patient was given 3 L of normal saline bolus, but his blood pressure still remains low in 80s despite IV fluid administration. Labs revealed renal insufficiency, but no leukocytosis, lactic acid level was normal. Chest x-ray showed no acute abnormalities. Patient was also complaining of left index finger blistering, some erythema, suspected cellulitis. X-ray of the finger was unremarkable. Hospitalist services were contacted for admission  PAST MEDICAL HISTORY:   Past Medical History  Diagnosis Date  . Multiple myeloma ([place])     PAST SURGICAL HISTORY: No past surgical history on file.  SOCIAL HISTORY:  Social History  Substance Use Topics  . Smoking status: Not on file  . Smokeless tobacco: Not on file  . Alcohol Use: Not on file    FAMILY HISTORY: No family history on file.  DRUG ALLERGIES: No Known Allergies  Review of Systems  Unable to perform ROS: critical illness    MEDICATIONS AT HOME:  Prior to Admission medications    Medication Sig Start Date End Date Taking? Authorizing Provider  albuterol (PROVENTIL HFA;VENTOLIN HFA) 108 (90 BASE) MCG/ACT inhaler Inhale 2 puffs into the lungs every 6 (six) hours as needed for wheezing or shortness of breath.   Yes Historical Provider, MD  ALPRAZolam Duanne Moron) 0.5 MG tablet Take 0.5-1 mg by mouth 3 (three) times daily as needed for anxiety.   Yes Historical Provider, MD  amiodarone (PACERONE) 200 MG tablet Take 200 mg by mouth daily.   Yes Historical Provider, MD  aspirin 325 MG EC tablet Take 325 mg by mouth daily.   Yes Historical Provider, MD  atorvastatin (LIPITOR) 20 MG tablet Take 20 mg by mouth daily.   Yes Historical Provider, MD  carvedilol (COREG) 6.25 MG tablet Take 6.25 mg by mouth 2 (two) times daily.    Yes Historical Provider, MD  dexamethasone (DECADRON) 4 MG tablet Take 4 mg by mouth once a week. Pt takes on Friday.   Yes Historical Provider, MD  docusate sodium (COLACE) 100 MG capsule Take 100 mg by mouth 2 (two) times daily.   Yes Historical Provider, MD  fentaNYL (DURAGESIC - DOSED MCG/HR) 100 MCG/HR Place 100 mcg onto the skin every other day. Pt uses in addition to a 12mg and 218m patch.   Yes Historical Provider, MD  fentaNYL (DURAGESIC - DOSED MCG/HR) 12 MCG/HR Place 12 mcg onto the skin every other day. Pt uses in addition to a 2565mand 100m15match.   Yes  Historical Provider, MD  fentaNYL (DURAGESIC - DOSED MCG/HR) 25 MCG/HR patch Place 25 mcg onto the skin every other day. Pt uses in addition to a 34mg and 1077m patch.   Yes Historical Provider, MD  Fluticasone-Salmeterol (ADVAIR) 100-50 MCG/DOSE AEPB Inhale 1 puff into the lungs 2 (two) times daily.   Yes Historical Provider, MD  gabapentin (NEURONTIN) 100 MG capsule Take 1 capsule (100 mg total) by mouth 3 (three) times daily. 09/15/14  Yes SaLeia AlfMD  HYDROmorphone (DILAUDID) 2 MG tablet Take 2-4 mg by mouth every 2 (two) hours as needed for severe pain.   Yes Historical Provider, MD   ibuprofen (ADVIL,MOTRIN) 200 MG tablet Take 400 mg by mouth every 6 (six) hours as needed for fever.   Yes Historical Provider, MD  lenalidomide (REVLIMID) 25 MG capsule Take 25 mg by mouth daily. Pt takes one capsule daily for 21 days, then 7 days off.   Yes Historical Provider, MD  lisinopril (PRINIVIL,ZESTRIL) 2.5 MG tablet Take 2.5 mg by mouth daily.   Yes Historical Provider, MD  polyethylene glycol (MIRALAX / GLYCOLAX) packet Take 17 g by mouth daily.   Yes Historical Provider, MD  potassium chloride SA (K-DUR,KLOR-CON) 10 MEQ tablet Take 1 tablet (10 mEq total) by mouth daily. Patient not taking: Reported on 12/23/2014 10/28/14   SaLeia AlfMD  promethazine (PHENERGAN) 25 MG tablet Take 1 tablet (25 mg total) by mouth every 4 (four) hours as needed for nausea or vomiting. 12/23/14   GoCammie SickleMD      PHYSICAL EXAMINATION:   VITAL SIGNS: Blood pressure 85/65, pulse 68, temperature 98.7 F (37.1 C), temperature source Oral, resp. rate 16, height _0  (1.854 m), weight 61.689 kg (136 lb), SpO2 96 %.  GENERAL:  [age].o.-year-old patient lying in the bed in the moderate to severe respiratory distress, tachypneic, diaphoretic, very sleepy, not able to wake him up.  EYES: Pupils equal, round, reactive to light and accommodation. No scleral icterus. Extraocular muscles intact.  HEENT: Head atraumatic, normocephalic. Oropharynx and nasopharynx clear.  NECK:  Supple, no jugular venous distention. No thyroid enlargement, no tenderness.  LUNGS: Diminished breath sounds bilaterally, bilateral wheezing, significant rales,rhonchi and crepitations noted on auscultation anteriorly. Using accessory muscles of respiration, even at rest.  CARDIOVASCULAR: S1, S2 normal. No murmurs, rubs, or gallops.  ABDOMEN: Soft, nontender, nondistended. Bowel sounds present. No organomegaly or mass.  EXTREMITIES: No pedal edema, cyanosis, or clubbing.  NEUROLOGIC: Cranial nerves II through XII are  intact. Muscle strength not able to assess in all extremities. Due to significant somnolence. Sensation not able to assess. Gait not checked.  PSYCHIATRIC: The patient is very sleepy and not able to assess orientation .  SKIN: Left index finger ulceration, erythema but no drainage, .   LABORATORY PANEL:   CBC  Recent Labs Lab 12/23/14 1017 12/23/14 1145  WBC 5.6 5.4  HGB 11.9* 12.2*  HCT 36.5* 37.2*  PLT 212 221  MCV 95.7 95.2  MCH 31.2 31.3  MCHC 32.6 32.8  RDW 17.3* 17.4*  LYMPHSABS 0.4*  --   MONOABS 0.8  --   EOSABS 0.1  --   BASOSABS 0.0  --    ------------------------------------------------------------------------------------------------------------------  Chemistries   Recent Labs Lab 12/23/14 1145  NA 138  K 3.8  CL 103  CO2 27  GLUCOSE 123*  BUN 19  CREATININE 1.35*  CALCIUM 8.3*  MG 1.6*  AST 68*  ALT 30  ALKPHOS 39  BILITOT 0.2*   ------------------------------------------------------------------------------------------------------------------  Cardiac Enzymes  Recent Labs Lab 12/23/14 1145  TROPONINI 0.03   ------------------------------------------------------------------------------------------------------------------  RADIOLOGY: Dg Hand 2 View Left  12/23/2014  CLINICAL DATA:  Left pointer finger swelling and pain radiating to the left wrist. EXAM: LEFT HAND - 2 VIEW COMPARISON:  None. FINDINGS: There is no evidence of fracture or dislocation. There is no evidence of arthropathy or other focal bone abnormality. Soft tissues are unremarkable. IMPRESSION: Negative. Electronically Signed   By: Abelardo Diesel M.D.   On: 12/23/2014 14:01   Dg Chest Portable 1 View  12/23/2014  CLINICAL DATA:  Shortness of breath for 3 days, productive cough, back pain with coughing, history multiple myeloma post chemotherapy most recently 12/09/2014, asthma, COPD, coronary disease post MI, CHF, hypertension, pacemaker EXAM: PORTABLE CHEST 1 VIEW COMPARISON:   Portable exam 1331 hours compared to 01/16/2014 FINDINGS: LEFT subclavian AICD with lead projecting over RIGHT ventricle. Upper normal size of cardiac silhouette. Tortuosity of thoracic aorta. Mediastinal contours and pulmonary vascularity otherwise normal. Lungs grossly clear. No pleural effusion or pneumothorax. Bones demineralized. IMPRESSION: No acute abnormalities. Electronically Signed   By: Lavonia Dana M.D.   On: 12/23/2014 14:01    EKG: Orders placed or performed during the hospital encounter of 12/23/14  . ED EKG  . ED EKG   EKG in emergency room 30th of November 2016 showed normal sinus rhythm at 85 beats per min, normal axis, right bundle-branch block. No acute ST-T changes  IMPRESSION AND PLAN:  Active Problems:   Sepsis ([place]) 1. Sepsis due to acute bronchitis, continue IV fluids, add Levophed to keep patient's MAP above 65 2. Acute hypoxic respiratory failure due to COPD exacerbation, acute bronchitis, continue oxygen therapy, keeping pulse oximeter at around 94% and above. Get ABGs to rule out CO2 retention 3. COPD exacerbation, initiate patient on steroids, inhalation therapy and follow clinically 4. Acute bronchitis, start patient on broad-spectrum antibiotic therapy, get sputum cultures if possible 5. Renal insufficiency, followed with IV fluid administration 6. Hyperglycemia. Hemoglobin A1c 7. Multiple myeloma with chronic pain syndrome, hold Revlimid get oncologist involved for further recommendations 8. Metabolic encephalopathy due to hypotension, hypoxia, likely, get a head CT .  9. Tobacco abuse counseling. Discussed with patient's family for 3 minutes. Nicotine replacement therapy will be initiated    All the records are reviewed and case discussed with ED provider. Management plans discussed with the patient, family and they are in agreement.  CODE STATUS:    TOTAL  CRITICAL CARETIME TAKING CARE OF THIS PATIENT: 90mnutes.    VTheodoro GristM.D on 12/23/2014  at 4:34 PM  Between 7am to 6pm - Pager - [phone] After 6pm go to [email or web address] - password [email or web address] [facility]ospitalists  Office  [phone] CC: Primary care physician; SPetra Kuba MD

## 2014-12-23 NOTE — ED Notes (Signed)
Holding levophen for now.  Map is currently 71.

## 2014-12-23 NOTE — ED Notes (Signed)
MD Stafford at bedside. 

## 2014-12-23 NOTE — Progress Notes (Signed)
Patient came to cancer center today for lab and possible procrit. Clinical h/o multiple myleoma. Upon arrival, he asked to see RN. Pt c/o of chills, rigors, wheeze." His girlfriend reports that patient had fever last 2 days 102. Patient also c/o of a sore on his left index finger. He states that he cut his finger yesterday and put a regular Band-Aid on his finger. The on inspection the left index finger is visibly swollen, red and multiple skins tears are present. Visible redness from tip of left index finger to left wrist. Patient reported throbbing sensations on index finger. Given the multiple medical issues, Dr. Rogue Bussing asked RN to escort patient to ER to r/o infectious process.  Procrit was held today as hgb was 11.9. Patient was agreeable to be transported to ED.  Patient in stable condition. Transported by Nira Conn, RN via wheelchair. Handoff provided to RN at check in.

## 2014-12-23 NOTE — ED Notes (Signed)
Pt is currently being treated for multiple myeloma , last treatment 11/16, pt went to cancer center today for labs, pt has a swollen left index finger with a blister from a band aid, pt reports coughing and fatigue, pt denies chest pain, mask placed on pt

## 2014-12-23 NOTE — Consult Note (Signed)
ANTIBIOTIC CONSULT NOTE - INITIAL  Pharmacy Consult for vancomycin/zosyn/levofloxacin Indication: sepsis/ PNA  No Known Allergies  Patient Measurements: Height: 6' 1"  (185.4 cm) Weight: 136 lb (61.689 kg) IBW/kg (Calculated) : 79.9 Adjusted Body Weight:   Vital Signs: Temp: 98.7 F (37.1 C) (11/30 1140) Temp Source: Oral (11/30 1140) BP: 89/64 mmHg (11/30 1630) Pulse Rate: 64 (11/30 1630) Intake/Output from previous day:   Intake/Output from this shift: Total I/O In: 2030 [I.V.:2030] Out: -   Labs:  Recent Labs  12/23/14 1017 12/23/14 1145  WBC 5.6 5.4  HGB 11.9* 12.2*  PLT 212 221  CREATININE  --  1.35*   Estimated Creatinine Clearance: 53.3 mL/min (by C-G formula based on Cr of 1.35). No results for input(s): VANCOTROUGH, VANCOPEAK, VANCORANDOM, GENTTROUGH, GENTPEAK, GENTRANDOM, TOBRATROUGH, TOBRAPEAK, TOBRARND, AMIKACINPEAK, AMIKACINTROU, AMIKACIN in the last 72 hours.   Microbiology: No results found for this or any previous visit (from the past 720 hour(s)).  Medical History: Past Medical History  Diagnosis Date  . Multiple myeloma (HCC)     Medications:  Scheduled:  . hydrocortisone sod succinate (SOLU-CORTEF) inj  100 mg Intravenous Q8H  . nicotine  21 mg Transdermal Daily  . vancomycin  750 mg Intravenous Q18H   Assessment: Pt is a 54 year old male currently undergoing chemotherapy/radiation for multiple myeloma. Pt presents with hypotension, cough, and swollen finger. Pharmacy consulted to dose levofloxacin, vancomycin and zosyn. Ke=0.048 Vd=43 T1/2=14  Goal of Therapy:  Vancomycin trough level 15-20 mcg/ml  Plan:  Measure antibiotic drug levels at steady state Follow up culture results pt recieved 1g of vancomycin in the ED. Will start vancomycin 787m q 18 hours, 6 hours after first dose. Will continue zosyn 3.375g q 8hr EI dosing and levofloxacin 5072mdaily.  Pharmacy to continue to monitor Thank you for the consult.  Melissa D  Maccia 12/23/2014,4:59 PM

## 2014-12-23 NOTE — ED Provider Notes (Signed)
[facility] Emergency Department Provider Note  ____________________________________________  Time seen: 12:35 PM  I have reviewed the triage vital signs and the nursing notes.   HISTORY  Chief Complaint Cough; Fatigue; and Code Sepsis    HPI [name] is a 54 y.o. male who presents with cough fatigue and a swollen left index finger. He is found to have hypotension when he went to the cancer center today, and was sent to the ED. Denies chest pain but feels very short of breath. No abdominal pain nausea vomiting.  He has multiple myeloma and is getting radiation and chemotherapy treatments, last chemotherapy about one week ago.   Past Medical History  Diagnosis Date  . Multiple myeloma [facility])      Patient Active Problem List   Diagnosis Date Noted  . Multiple myeloma ([place]) 05/26/2014     No past surgical history on file.   Current Outpatient Rx  Name  Route  Sig  Dispense  Refill  . albuterol (PROVENTIL HFA;VENTOLIN HFA) 108 (90 BASE) MCG/ACT inhaler   Inhalation   Inhale 2 puffs into the lungs every 6 (six) hours as needed for wheezing or shortness of breath.         . ALPRAZolam (XANAX) 0.5 MG tablet   Oral   Take 0.5-1 mg by mouth 3 (three) times daily as needed for anxiety.         Marland Kitchen amiodarone (PACERONE) 200 MG tablet   Oral   Take 200 mg by mouth daily.         Marland Kitchen aspirin 325 MG EC tablet   Oral   Take 325 mg by mouth daily.         Marland Kitchen atorvastatin (LIPITOR) 20 MG tablet   Oral   Take 20 mg by mouth daily.         . carvedilol (COREG) 6.25 MG tablet   Oral   Take 6.25 mg by mouth 2 (two) times daily.          Marland Kitchen dexamethasone (DECADRON) 4 MG tablet   Oral   Take 4 mg by mouth once a week. Pt takes on Friday.         . docusate sodium (COLACE) 100 MG capsule   Oral   Take 100 mg by mouth 2 (two) times daily.         . fentaNYL (DURAGESIC - DOSED MCG/HR) 100 MCG/HR   Transdermal   Place 100 mcg onto the  skin every other day. Pt uses in addition to a 49mg and 236m patch.         . fentaNYL (DURAGESIC - DOSED MCG/HR) 12 MCG/HR   Transdermal   Place 12 mcg onto the skin every other day. Pt uses in addition to a 2532mand 100m38match.         . fentaNYL (DURAGESIC - DOSED MCG/HR) 25 MCG/HR patch   Transdermal   Place 25 mcg onto the skin every other day. Pt uses in addition to a 12mc105md 100mcg25mch.         . Fluticasone-Salmeterol (ADVAIR) 100-50 MCG/DOSE AEPB   Inhalation   Inhale 1 puff into the lungs 2 (two) times daily.         . gabaMarland Kitchenentin (NEURONTIN) 100 MG capsule   Oral   Take 1 capsule (100 mg total) by mouth 3 (three) times daily.   90 capsule   3   . HYDROmorphone (DILAUDID) 2 MG tablet  Oral   Take 2-4 mg by mouth every 2 (two) hours as needed for severe pain.         Marland Kitchen ibuprofen (ADVIL,MOTRIN) 200 MG tablet   Oral   Take 400 mg by mouth every 6 (six) hours as needed for fever.         Marland Kitchen lenalidomide (REVLIMID) 25 MG capsule   Oral   Take 25 mg by mouth daily. Pt takes one capsule daily for 21 days, then 7 days off.         . lisinopril (PRINIVIL,ZESTRIL) 2.5 MG tablet   Oral   Take 2.5 mg by mouth daily.         . polyethylene glycol (MIRALAX / GLYCOLAX) packet   Oral   Take 17 g by mouth daily.         . promethazine (PHENERGAN) 25 MG tablet   Oral   Take 1 tablet (25 mg total) by mouth every 4 (four) hours as needed for nausea or vomiting.   60 tablet   2   . potassium chloride SA (K-DUR,KLOR-CON) 10 MEQ tablet   Oral   Take 1 tablet (10 mEq total) by mouth daily. Patient not taking: Reported on 12/23/2014   5 tablet   0      Allergies Review of patient's allergies indicates no known allergies.   No family history on file.  Social History Social History  Substance Use Topics  . Smoking status: None  . Smokeless tobacco: None  . Alcohol Use: None    Review of Systems  Constitutional:   No fever positive  chills. No weight changes Eyes:   No blurry vision or double vision.  ENT:   No sore throat. Cardiovascular:   No chest pain. Respiratory:   Positive dyspnea with cough. Gastrointestinal:   Negative for abdominal pain, vomiting and diarrhea.  No BRBPR or melena. Genitourinary:   Negative for dysuria, urinary retention, bloody urine, or difficulty urinating. Musculoskeletal:   Negative for back pain. No joint swelling or pain. Skin:   Negative for rash. Neurological:   Negative for headaches, focal weakness or numbness. Psychiatric:  No anxiety or depression.   Endocrine:  No hot/cold intolerance, changes in energy, or sleep difficulty.  10-point ROS otherwise negative.  ____________________________________________   PHYSICAL EXAM:  VITAL SIGNS: ED Triage Vitals  Enc Vitals Group     BP 12/23/14 1140 109/80 mmHg     Pulse Rate 12/23/14 1140 91     Resp 12/23/14 1140 20     Temp 12/23/14 1140 98.7 F (37.1 C)     Temp Source 12/23/14 1140 Oral     SpO2 12/23/14 1140 96 %     Weight 12/23/14 1140 136 lb (61.689 kg)     Height 12/23/14 1140 6' 1"  (1.854 m)     Head Cir --      Peak Flow --      Pain Score 12/23/14 1225 0     Pain Loc --      Pain Edu? --      Excl. in [place]? --    Initial blood pressure 80/50  Constitutional:   Alert and oriented. Ill-appearing. Eyes:   No scleral icterus. No conjunctival pallor. PERRL. EOMI ENT   Head:   Normocephalic and atraumatic.   Nose:   No congestion/rhinnorhea. No septal hematoma   Mouth/Throat:   Dry mucous membranes, no pharyngeal erythema. No peritonsillar mass. No uvula shift.   Neck:   No  stridor. No SubQ emphysema. No meningismus. Hematological/Lymphatic/Immunilogical:   No cervical lymphadenopathy. Cardiovascular:   RRR. Normal and symmetric distal pulses are present in all extremities. No murmurs, rubs, or gallops. Respiratory:   Diffuse wheezing, prolonged expiratory phase. Gastrointestinal:   Soft and  nontender. No distention. There is no CVA tenderness.  No rebound, rigidity, or guarding. Genitourinary:   deferred Musculoskeletal:   Nontender with normal range of motion in all extremities. There is focal swelling erythema and tenderness of the left index finger. Multiple areas of superficial skin breakdown without laceration or abscess.  Neurologic:   Normal speech and language.  CN 2-10 normal. Motor grossly intact. No pronator drift.  Normal gait. No gross focal neurologic deficits are appreciated.  Skin:    Skin is warm, diaphoretic and intact. No rash noted.  No petechiae, purpura, or bullae. Psychiatric:   Mood and affect are normal. Speech and behavior are normal. Patient exhibits appropriate insight and judgment.  ____________________________________________    LABS (pertinent positives/negatives) (all labs ordered are listed, but only abnormal results are displayed) Labs Reviewed  BASIC METABOLIC PANEL - Abnormal; Notable for the following:    Glucose, Bld 123 (*)    Creatinine, Ser 1.35 (*)    Calcium 8.3 (*)    GFR calc non Af Amer 57 (*)    All other components within normal limits  CBC - Abnormal; Notable for the following:    RBC 3.91 (*)    Hemoglobin 12.2 (*)    HCT 37.2 (*)    RDW 17.4 (*)    All other components within normal limits  HEPATIC FUNCTION PANEL - Abnormal; Notable for the following:    Albumin 3.2 (*)    AST 68 (*)    Total Bilirubin 0.2 (*)    Indirect Bilirubin 0.1 (*)    All other components within normal limits  MAGNESIUM - Abnormal; Notable for the following:    Magnesium 1.6 (*)    All other components within normal limits  URIC ACID - Abnormal; Notable for the following:    Uric Acid, Serum 3.9 (*)    All other components within normal limits  CULTURE, BLOOD (ROUTINE X 2)  CULTURE, BLOOD (ROUTINE X 2)  URINE CULTURE  LACTIC ACID, PLASMA  LIPASE, BLOOD  TROPONIN I  PHOSPHORUS  LACTIC ACID, PLASMA  URINALYSIS COMPLETEWITH  MICROSCOPIC ([facility] ONLY)  CBG MONITORING, ED   ____________________________________________   EKG  Interpreted by me Normal sinus rhythm rate of 85, normal axis and intervals. Right bundle branch block. Normal ST segments and T waves  ____________________________________________    RADIOLOGY  Chest x-ray unremarkable  ____________________________________________   PROCEDURES CRITICAL CARE Performed by: Joni Fears, Radha Coggins   Total critical care time: 35 minutes  Critical care time was exclusive of separately billable procedures and treating other patients.  Critical care was necessary to treat or prevent imminent or life-threatening deterioration.  Critical care was time spent personally by me on the following activities: development of treatment plan with patient and/or surrogate as well as nursing, discussions with consultants, evaluation of patient's response to treatment, examination of patient, obtaining history from patient or surrogate, ordering and performing treatments and interventions, ordering and review of laboratory studies, ordering and review of radiographic studies, pulse oximetry and re-evaluation of patient's condition.   ____________________________________________   INITIAL IMPRESSION / ASSESSMENT AND PLAN / ED COURSE  Pertinent labs & imaging results that were available during my care of the patient were reviewed by me and considered  in my medical decision making (see chart for details).  Patient presents with hypotension and hypoxia and diaphoresis. Concern for sepsis from pneumonia. Empiric vancomycin and Levaquin and Zosyn, 2 L IV saline bolus resuscitation. These anabolic should also cover the cellulitis of the left index finger which does not appear to need surgery or incision at this time.  ----------------------------------------- 4:13 PM on 12/23/2014 -----------------------------------------  Still diaphoretic, still very wheezy with  prolonged expirations. Will admit for further management. Blood pressure is stabilized at 90/70 after 2 L of saline.     ____________________________________________   FINAL CLINICAL IMPRESSION(S) / ED DIAGNOSES  Final diagnoses:  COPD with exacerbation (HCC)  Hypotension, unspecified hypotension type  Cellulitis of second finger of left hand      Carrie Mew, MD 12/23/14 1614

## 2014-12-24 ENCOUNTER — Ambulatory Visit: Payer: Medicaid Other | Admitting: Surgery

## 2014-12-24 DIAGNOSIS — D899 Disorder involving the immune mechanism, unspecified: Secondary | ICD-10-CM

## 2014-12-24 DIAGNOSIS — R509 Fever, unspecified: Secondary | ICD-10-CM

## 2014-12-24 DIAGNOSIS — L8990 Pressure ulcer of unspecified site, unspecified stage: Secondary | ICD-10-CM

## 2014-12-24 DIAGNOSIS — I959 Hypotension, unspecified: Secondary | ICD-10-CM

## 2014-12-24 DIAGNOSIS — IMO0001 Reserved for inherently not codable concepts without codable children: Secondary | ICD-10-CM | POA: Insufficient documentation

## 2014-12-24 DIAGNOSIS — L03012 Cellulitis of left finger: Secondary | ICD-10-CM

## 2014-12-24 DIAGNOSIS — R6520 Severe sepsis without septic shock: Secondary | ICD-10-CM

## 2014-12-24 DIAGNOSIS — R531 Weakness: Secondary | ICD-10-CM

## 2014-12-24 DIAGNOSIS — C9000 Multiple myeloma not having achieved remission: Secondary | ICD-10-CM

## 2014-12-24 DIAGNOSIS — E440 Moderate protein-calorie malnutrition: Secondary | ICD-10-CM | POA: Insufficient documentation

## 2014-12-24 LAB — URINALYSIS COMPLETE WITH MICROSCOPIC (ARMC ONLY)
BACTERIA UA: NONE SEEN
Bilirubin Urine: NEGATIVE
Glucose, UA: NEGATIVE mg/dL
HGB URINE DIPSTICK: NEGATIVE
KETONES UR: NEGATIVE mg/dL
LEUKOCYTES UA: NEGATIVE
NITRITE: NEGATIVE
PH: 6 (ref 5.0–8.0)
PROTEIN: NEGATIVE mg/dL
SPECIFIC GRAVITY, URINE: 1.014 (ref 1.005–1.030)
SQUAMOUS EPITHELIAL / LPF: NONE SEEN

## 2014-12-24 LAB — COMPREHENSIVE METABOLIC PANEL
ALT: 26 U/L (ref 17–63)
AST: 43 U/L — ABNORMAL HIGH (ref 15–41)
Albumin: 2.5 g/dL — ABNORMAL LOW (ref 3.5–5.0)
Alkaline Phosphatase: 28 U/L — ABNORMAL LOW (ref 38–126)
Anion gap: 6 (ref 5–15)
BUN: 19 mg/dL (ref 6–20)
CHLORIDE: 106 mmol/L (ref 101–111)
CO2: 26 mmol/L (ref 22–32)
Calcium: 7.5 mg/dL — ABNORMAL LOW (ref 8.9–10.3)
Creatinine, Ser: 0.94 mg/dL (ref 0.61–1.24)
Glucose, Bld: 147 mg/dL — ABNORMAL HIGH (ref 65–99)
POTASSIUM: 4.4 mmol/L (ref 3.5–5.1)
Sodium: 138 mmol/L (ref 135–145)
Total Bilirubin: <0.1 mg/dL — ABNORMAL LOW (ref 0.3–1.2)
Total Protein: 5.3 g/dL — ABNORMAL LOW (ref 6.5–8.1)

## 2014-12-24 LAB — HEMOGLOBIN A1C: HEMOGLOBIN A1C: 5.4 % (ref 4.0–6.0)

## 2014-12-24 MED ORDER — COLLAGENASE 250 UNIT/GM EX OINT
TOPICAL_OINTMENT | Freq: Every day | CUTANEOUS | Status: DC
  Administered 2014-12-24 – 2014-12-25 (×2): 1 via TOPICAL
  Administered 2014-12-26 – 2014-12-27 (×2): via TOPICAL
  Filled 2014-12-24 (×2): qty 30

## 2014-12-24 MED ORDER — ENSURE ENLIVE PO LIQD
237.0000 mL | Freq: Three times a day (TID) | ORAL | Status: DC
  Administered 2014-12-24 – 2014-12-27 (×9): 237 mL via ORAL

## 2014-12-24 MED ORDER — SODIUM CHLORIDE 0.9 % IV BOLUS (SEPSIS)
500.0000 mL | Freq: Once | INTRAVENOUS | Status: AC
  Administered 2014-12-24: 500 mL via INTRAVENOUS

## 2014-12-24 MED ORDER — SILVER SULFADIAZINE 1 % EX CREA
TOPICAL_CREAM | Freq: Every day | CUTANEOUS | Status: DC
  Administered 2014-12-24 – 2014-12-25 (×2): 1 via TOPICAL
  Administered 2014-12-26 – 2014-12-27 (×2): via TOPICAL
  Filled 2014-12-24: qty 25

## 2014-12-24 MED ORDER — SODIUM CHLORIDE 0.9 % IV SOLN
1250.0000 mg | INTRAVENOUS | Status: DC
  Administered 2014-12-24 – 2014-12-25 (×2): 1250 mg via INTRAVENOUS
  Filled 2014-12-24 (×3): qty 1250

## 2014-12-24 MED ORDER — FENTANYL 50 MCG/HR TD PT72
100.0000 ug | MEDICATED_PATCH | TRANSDERMAL | Status: DC
  Administered 2014-12-24 – 2014-12-26 (×2): 100 ug via TRANSDERMAL
  Filled 2014-12-24 (×2): qty 2

## 2014-12-24 NOTE — Consult Note (Signed)
ANTIBIOTIC CONSULT NOTE - INITIAL  Pharmacy Consult for vancomycin/levofloxacin Indication: sepsis/ PNA  No Known Allergies  Patient Measurements: Height: _0  (185.4 cm) Weight: 136 lb (61.689 kg) IBW/kg (Calculated) : 79.9 Vital Signs: Temp: 97.6 F (36.4 C) (12/01 0800) Temp Source: Oral (12/01 0800) BP: 79/52 mmHg (12/01 1000) Pulse Rate: 70 (12/01 1000)  Intake/Output from previous day: 11/30 0701 - 12/01 0700 In: 2380 [I.V.:2180; IV Piggyback:200] Out: 1600 [Urine:1600] Intake/Output from this shift: Total I/O In: 203 [I.V.:153; IV Piggyback:50] Out: 470 [Urine:470]  Recent Labs  12/23/14 1017 12/23/14 1145 12/24/14 0410  WBC 5.6 5.4  --   HGB 11.9* 12.2*  --   PLT 212 221  --   CREATININE  --  1.35* 0.94   Estimated Creatinine Clearance: 76.6 mL/min (by C-G formula based on Cr of 0.94).  Microbiology: Recent Results (from the past 720 hour(s))  Culture, blood (routine x 2)     Status: None (Preliminary result)   Collection Time: 12/23/14 12:40 PM  Result Value Ref Range Status   Specimen Description BLOOD LEFT FATTY CASTS  Final   Special Requests   Final    BOTTLES DRAWN AEROBIC AND ANAEROBIC 3CC AEROBIC,3CCANAEROBIC   Culture NO GROWTH < 24 HOURS  Final   Report Status PENDING  Incomplete  Culture, blood (routine x 2)     Status: None (Preliminary result)   Collection Time: 12/23/14 12:40 PM  Result Value Ref Range Status   Specimen Description BLOOD RIGHT FATTY CASTS  Final   Special Requests   Final    BOTTLES DRAWN AEROBIC AND ANAEROBIC 1CC AEROBIC,1CC ANAEROBIC   Culture NO GROWTH < 24 HOURS  Final   Report Status PENDING  Incomplete  MRSA PCR Screening     Status: None   Collection Time: 12/23/14  5:57 PM  Result Value Ref Range Status   MRSA by PCR NEGATIVE NEGATIVE Final    Comment:        The GeneXpert MRSA Assay (FDA approved for NASAL specimens only), is one component of a comprehensive MRSA colonization surveillance program. It is  not intended to diagnose MRSA infection nor to guide or monitor treatment for MRSA infections.     Medical History: Past Medical History  Diagnosis Date  . Multiple myeloma [facility])     Assessment: Pt is a 54 year old male currently undergoing chemotherapy/radiation for multiple myeloma. Pt presents with hypotension, cough, and swollen finger. Pharmacy consulted to dose levofloxacin, Zosyn and Vancomycin on 11/30.   Patient was started on 721m q18h. CrCl and Scr improved.   Zosyn was Dcd during rounds on 12/1.  Goal of Therapy:  Vancomycin trough level 15-20 mcg/ml  Plan:   Ke: 0.068     Vd: 43 T1/2: 10.2  Will change Vancomycin dose to 1250 mg every 18 hours with a expected trough of 16.5. Trough ordered prior to 4th dose on 12/3 _1 . Will continue levofloxacin 5036mevery 24 hours.  Pharmacy will continue to monitor renal function and labs and make adjustments as needed.   ShNancy FetterPharmD Pharmacy Resident 12/24/2014 12:24 PM

## 2014-12-24 NOTE — Progress Notes (Addendum)
Patient ID: [name], male   DOB: [birth year], 54 y.o.   MRN: [id number] [place] [facility] [place] NAME: [name]    MR#:  [id number]  DATE OF BIRTH:  [birth year]  SUBJECTIVE:  Feels weak  REVIEW OF SYSTEMS:   Review of Systems  Constitutional: Negative for fever, chills and weight loss.  HENT: Negative for ear discharge, ear pain and nosebleeds.   Eyes: Negative for blurred vision, pain and discharge.  Respiratory: Positive for cough and shortness of breath. Negative for sputum production, wheezing and stridor.   Cardiovascular: Negative for chest pain, palpitations, orthopnea and PND.  Gastrointestinal: Negative for nausea, vomiting, abdominal pain and diarrhea.  Genitourinary: Negative for urgency and frequency.  Musculoskeletal: Negative for back pain and joint pain.  Neurological: Positive for weakness. Negative for sensory change, speech change and focal weakness.  Psychiatric/Behavioral: Negative for depression and hallucinations. The patient is not nervous/anxious.   All other systems reviewed and are negative.  Tolerating Diet:yes Tolerating PT: pending  DRUG ALLERGIES:  No Known Allergies  VITALS:  Blood pressure 79/52, pulse 70, temperature 97.6 F (36.4 C), temperature source Oral, resp. rate 9, height 6' 1"  (1.854 m), weight 61.689 kg (136 lb), SpO2 97 %.  PHYSICAL EXAMINATION:   Physical Exam  GENERAL:  54 y.o.-year-old patient lying in the bed with no acute distress.  EYES: Pupils equal, round, reactive to light and accommodation. No scleral icterus. Extraocular muscles intact.  HEENT: Head atraumatic, normocephalic. Oropharynx and nasopharynx clear.  NECK:  Supple, no jugular venous distention. No thyroid enlargement, no tenderness.  LUNGS: Normal breath sounds bilaterally, no wheezing, rales, rhonchi. No use of accessory muscles of respiration.  CARDIOVASCULAR: S1, S2 normal. No murmurs, rubs, or gallops.   ABDOMEN: Soft, nontender, nondistended. Bowel sounds present. No organomegaly or mass.  EXTREMITIES: No cyanosis, clubbing or edema b/l.   Left thigh lesion about 2x3 cm with pus NEUROLOGIC: Cranial nerves II through XII are intact. No focal Motor or sensory deficits b/l.   PSYCHIATRIC: The patient is alert and oriented x 3.  SKIN: No obvious rash, lesion, or ulcer.    LABORATORY PANEL:   CBC  Recent Labs Lab 12/23/14 1145  WBC 5.4  HGB 12.2*  HCT 37.2*  PLT 221    Chemistries   Recent Labs Lab 12/23/14 1145 12/24/14 0410  NA 138 138  K 3.8 4.4  CL 103 106  CO2 27 26  GLUCOSE 123* 147*  BUN 19 19  CREATININE 1.35* 0.94  CALCIUM 8.3* 7.5*  MG 1.6*  --   AST 68* 43*  ALT 30 26  ALKPHOS 39 28*  BILITOT 0.2* <0.1*    Cardiac Enzymes  Recent Labs Lab 12/23/14 1145  TROPONINI 0.03    RADIOLOGY:  Ct Head Wo Contrast  12/23/2014  CLINICAL DATA:  Fever, chills and cough for 3 days. Patient has history of multiple myeloma. EXAM: CT HEAD WITHOUT CONTRAST TECHNIQUE: Contiguous axial images were obtained from the base of the skull through the vertex without intravenous contrast. COMPARISON:  May 29, 2012 FINDINGS: There is no midline shift hydrocephalus or mass. No acute hemorrhage or acute transcortical infarct is identified. The bony calvarium is intact. The visualized sinuses are clear. IMPRESSION: No focal acute intracranial abnormality identified. Electronically Signed   By: [name] M.D.   On: 12/23/2014 17:15   Dg Hand 2 View Left  12/23/2014  CLINICAL DATA:  Left pointer finger swelling and  pain radiating to the left wrist. EXAM: LEFT HAND - 2 VIEW COMPARISON:  None. FINDINGS: There is no evidence of fracture or dislocation. There is no evidence of arthropathy or other focal bone abnormality. Soft tissues are unremarkable. IMPRESSION: Negative. Electronically Signed   By: [name] M.D.   On: 12/23/2014 14:01   Dg Chest Portable 1 View  12/23/2014   CLINICAL DATA:  Shortness of breath for 3 days, productive cough, back pain with coughing, history multiple myeloma post chemotherapy most recently 12/09/2014, asthma, COPD, coronary disease post MI, CHF, hypertension, pacemaker EXAM: PORTABLE CHEST 1 VIEW COMPARISON:  Portable exam 1331 hours compared to 01/16/2014 FINDINGS: LEFT subclavian AICD with lead projecting over RIGHT ventricle. Upper normal size of cardiac silhouette. Tortuosity of thoracic aorta. Mediastinal contours and pulmonary vascularity otherwise normal. Lungs grossly clear. No pleural effusion or pneumothorax. Bones demineralized. IMPRESSION: No acute abnormalities. Electronically Signed   By: [name] M.D.   On: 12/23/2014 14:01   ASSESSMENT AND PLAN:  [name] is a 54 y.o. male with a known history of multiple myeloma who presents to the hospital with complaints of fevers, chills, cough for the past 3 days with green phlegm, wheezing since last night feeling weak and tired  1. Sepsis due to acute bronchitis, continue IV fluids, add Levophed to keep patient's MAP above 65 -pt runs low BP 90-100 -IVF -not on any pressors at present -BC 1/2  GPC in aerobic -IV vanc and zosyn  2. Acute hypoxic respiratory failure due to COPD exacerbation, acute bronchitis, continue oxygen therapy, keeping pulse oximeter at around 94% and above. Get ABGs to rule out CO2 retention -IV levaquin   3. Left thigh ulcer/esion with chronic pus for 1 month -induration+ -spoke with Dr [name] to see pt  4. Renal insufficiency, followed with IV fluid administration  5. Multiple myeloma with chronic pain syndrome, hold Revlimid get oncologist involved for further recommendations  6. Metabolic encephalopathy due to hypotension, hyoxia Ct head neg  7. Tobacco abuse counseling. Discussed with patient's family for 3 minutes. Nicotine replacement therapy will be initiated    Case discussed with Care Management/Social Worker. Management plans  discussed with the patient, family and they are in agreement.  CODE STATUS: full  DVT Prophylaxis: lovneox  TOTAL criticalTIME TAKING CARE OF THIS PATIENT: 35 minutes.  >50% time spent on counselling and coordination of care  POSSIBLE D/C IN 2-3 DAYS, DEPENDING ON CLINICAL CONDITION.   [name] Schrom M.D on 12/24/2014 at 1:56 PM  Between 7am to 6pm - Pager - [phone]  After 6pm go to [email or web address] - password [email or web address] [facility]  Office  [phone]  CC: Primary care physician; Petra Kuba, MD

## 2014-12-24 NOTE — Progress Notes (Signed)
Pt lying in bed resting quietly . Pt had difficulty urinating this shift with some noted urinary distention . Bladder scanned with >1428ml  physician notified foley inserted . Pt denies  pain or discomfort this shift .

## 2014-12-24 NOTE — Progress Notes (Signed)
Initial Nutrition Assessment  DOCUMENTATION CODES:   Non-severe (moderate) malnutrition in context of chronic illness  INTERVENTION:   Meals and Snacks: Cater to patient preferences Medical Food Supplement Therapy: pt drinks Boost at home; recommend sending Ensure Enlive po TID, each supplement provides 350 kcal and 20 grams of protein  NUTRITION DIAGNOSIS:   Malnutrition related to chronic illness, cancer and cancer related treatments as evidenced by mild depletion of body fat, moderate depletions of muscle mass.  GOAL:   Patient will meet greater than or equal to 90% of their needs  MONITOR:    (Energy Intake, Anthropometrics, Digestive Syste, Electrolyte/Renal Profile)  REASON FOR ASSESSMENT:   Malnutrition Screening Tool    ASSESSMENT:    Pt admitted with sepsis due to acute bronchitis, COPD exacerbation. Pt with multiple myeloma on maintenance Revlimid. Pt with left index ulceration and pressure ulcer on left hip. Pt with generalized weakness, pt reports fevers nightly for past month  Past Medical History  Diagnosis Date  . Multiple myeloma (HCC)     Diet Order:  DIET DYS 3 Room service appropriate?: Yes; Fluid consistency:: Thin   Energy Intake: pt ate most of his pancakes and drank juice this AM; also had peanut butter and 4 packs of graham crackers earlier this AM. No recorded po intake  Food and Nutrition Related History: pt reports recently appetite has been good, eating 3 meals per day at  Home, drinking boost daily with recent wt gain  Skin:   (Left index finger ulceration, trauma wound. stage III/unstageable pressure ulcer on left hip)  Electrolyte and Renal Profile:  Recent Labs Lab 12/23/14 1145 12/24/14 0410  BUN 19 19  CREATININE 1.35* 0.94  NA 138 138  K 3.8 4.4  MG 1.6*  --   PHOS 3.9  --    Glucose Profile: No results for input(s): GLUCAP in the last 72 hours. Lab Results  Component Value Date   HGBA1C 5.4 12/23/2014   Nutritional  Anemia Profile:  CBC Latest Ref Rng 12/23/2014 12/23/2014 12/09/2014  WBC 3.8 - 10.6 K/uL 5.4 5.6 7.6  Hemoglobin 13.0 - 18.0 g/dL 12.2(L) 11.9(L) 11.9(L)  Hematocrit 40.0 - 52.0 % 37.2(L) 36.5(L) 36.4(L)  Platelets 150 - 440 K/uL 221 212 216    Meds: NS at 75 ml/hr, solumedrol, levophed  Nutrition Focused Physical Exam: Nutrition-Focused physical exam completed. Findings are mild fat depletion, mild/moderate muscle depletion, and no edema.    Height:   Ht Readings from Last 1 Encounters:  12/23/14 6' 1"  (1.854 m)    Weight: pt reports recent wt gain, reports he weighed 126 pounds (down from 185 pounds when he first got sick). Current wt of 136 pounds  Wt Readings from Last 1 Encounters:  12/23/14 136 lb (61.689 kg)    Filed Weights   12/23/14 1140  Weight: 136 lb (61.689 kg)   Wt Readings from Last 10 Encounters:  12/23/14 136 lb (61.689 kg)  10/28/14 137 lb 9.1 oz (62.4 kg)  08/04/14 129 lb 10.1 oz (58.8 kg)    BMI:  Body mass index is 17.95 kg/(m^2).  Estimated Nutritional Needs:   Kcal:  2139-2526 kcals (BEE 1495, 1.3 AF, 1.1-1.3 IF)   Protein:  74-93 g (1.2-1.5 g/kg)   Fluid:  1860-2170 mL (30-35 ml/kG)   MODERATE Care Level  Kerman Passey MS, RD, LDN [phone] Pager

## 2014-12-24 NOTE — Consult Note (Signed)
[facility] CONSULT NOTE  Patient Care Team: Petra Kuba, MD as PCP - General (Family Medicine)  CHIEF COMPLAINTS/PURPOSE OF CONSULTATION:  Multiple myeloma with sepsis  HISTORY OF PRESENTING ILLNESS:  [name] 54 y.o.  male history of multiple myeloma [most recent M protein September 2016-0.5 g/dL; with normal  Kappa/ lambda light chain ratio]- on maintenance Revlimid/and also Procrit as needed is currently admitted to the hospital for rigors/chills and fever.  Patient states that he woke up with chills and rigors/fevers at home- and he felt weak overall. He actually fell because of the generalized weakness. He also complains of pain and swelling of his left index finger along with redness. On evaluation emergency room patient was hypotensive with systolic blood pressures in the 80s to 90s; tachycardic. Chest x-ray negative for any acute process. He has been started on broad-spectrum antibiotics; blood cultures pending. Is currently in the ICU.   Patient denies any shortness of breath or any cough. Denies any unusual abdominal pain denies unusual chest pain. Denies any unusual back pain.  Course in the hospital: Patient feels slightly improved since yesterday. He is still feels cold. However his blood pressures have improved- his currently off pressors.    ROS: A complete 10 point review of system is done which is negative except mentioned above in history of present illness  MEDICAL HISTORY:  Past Medical History  Diagnosis Date  . Multiple myeloma (HCC)     SURGICAL HISTORY: No past surgical history on file.  SOCIAL HISTORY: Social History   Social History  . Marital Status: Divorced    Spouse Name: N/A  . Number of Children: N/A  . Years of Education: N/A   Occupational History  . Not on file.   Social History Main Topics  . Smoking status: Current Every Day Smoker -- 0.50 packs/day for 40 years    Types: Cigarettes  . Smokeless tobacco: Not  on file  . Alcohol Use: Not on file  . Drug Use: Not on file  . Sexual Activity: Not on file   Other Topics Concern  . Not on file   Social History Narrative  . No narrative on file    FAMILY HISTORY: Denies any history of multiple myeloma in the family. No family history on file.  ALLERGIES:  has No Known Allergies.  MEDICATIONS:  Current Facility-Administered Medications  Medication Dose Route Frequency Provider Last Rate Last Dose  . 0.9 %  sodium chloride infusion   Intravenous Continuous Theodoro Grist, MD 75 mL/hr at 12/24/14 0900    . acetaminophen (TYLENOL) tablet 650 mg  650 mg Oral Q6H PRN Theodoro Grist, MD       Or  . acetaminophen (TYLENOL) suppository 650 mg  650 mg Rectal Q6H PRN Theodoro Grist, MD      . albuterol (PROVENTIL) (2.5 MG/3ML) 0.083% nebulizer solution 2.5 mg  2.5 mg Nebulization Q4H Theodoro Grist, MD   2.5 mg at 12/24/14 1154  . amiodarone (PACERONE) tablet 200 mg  200 mg Oral Daily Theodoro Grist, MD   200 mg at 12/24/14 1131  . antiseptic oral rinse (CPC / CETYLPYRIDINIUM CHLORIDE 0.05%) solution 7 mL  7 mL Mouth Rinse BID Theodoro Grist, MD   7 mL at 12/23/14 2133  . aspirin EC tablet 325 mg  325 mg Oral Daily Theodoro Grist, MD   325 mg at 12/23/14 1809  . atorvastatin (LIPITOR) tablet 20 mg  20 mg Oral Daily Theodoro Grist, MD  20 mg at 12/24/14 1130  . collagenase (SANTYL) ointment   Topical Daily Fritzi Mandes, MD      . docusate sodium (COLACE) capsule 100 mg  100 mg Oral BID Theodoro Grist, MD   100 mg at 12/24/14 1130  . enoxaparin (LOVENOX) injection 40 mg  40 mg Subcutaneous Q24H Theodoro Grist, MD   40 mg at 12/23/14 1843  . feeding supplement (ENSURE ENLIVE) (ENSURE ENLIVE) liquid 237 mL  237 mL Oral TID WC Fritzi Mandes, MD      . gabapentin (NEURONTIN) capsule 100 mg  100 mg Oral TID Theodoro Grist, MD   100 mg at 12/24/14 1131  . hydrocortisone sodium succinate (SOLU-CORTEF) 100 MG injection 100 mg  100 mg Intravenous Q8H Theodoro Grist, MD   100 mg at  12/24/14 0801  . levofloxacin (LEVAQUIN) IVPB 500 mg  500 mg Intravenous Q24H Ramond Dial, RPH      . nicotine (NICODERM CQ - dosed in mg/24 hours) patch 21 mg  21 mg Transdermal Daily Theodoro Grist, MD   21 mg at 12/23/14 1809  . norepinephrine (LEVOPHED) 4 mg in dextrose 5 % 250 mL (0.016 mg/mL) infusion  2 mcg/min Intravenous Titrated Theodoro Grist, MD   Stopped at 12/23/14 1643  . ondansetron (ZOFRAN) tablet 4 mg  4 mg Oral Q6H PRN Theodoro Grist, MD       Or  . ondansetron (ZOFRAN) injection 4 mg  4 mg Intravenous Q6H PRN Theodoro Grist, MD      . oxyCODONE (Oxy IR/ROXICODONE) immediate release tablet 5 mg  5 mg Oral Q3H PRN Theodoro Grist, MD      . polyethylene glycol (MIRALAX / GLYCOLAX) packet 17 g  17 g Oral Daily Theodoro Grist, MD   17 g at 12/24/14 1131  . promethazine (PHENERGAN) tablet 25 mg  25 mg Oral Q4H PRN Theodoro Grist, MD      . silver sulfADIAZINE (SILVADENE) 1 % cream   Topical Daily Fritzi Mandes, MD      . sodium chloride 0.9 % injection 3 mL  3 mL Intravenous Q12H Theodoro Grist, MD   3 mL at 12/24/14 1000  . tiotropium (SPIRIVA) inhalation capsule 18 mcg  18 mcg Inhalation Daily Theodoro Grist, MD   18 mcg at 12/24/14 0800  . vancomycin (VANCOCIN) 1,250 mg in sodium chloride 0.9 % 250 mL IVPB  1,250 mg Intravenous Q18H Sheema M Hallaji, RPH          .  PHYSICAL EXAMINATION:  Filed Vitals:   12/24/14 0800 12/24/14 1000  BP: 103/76 79/52  Pulse: 74 70  Temp: 97.6 F (36.4 C)   Resp: 16 9   Filed Weights   12/23/14 1140  Weight: 136 lb (61.689 kg)    GENERAL: sick appearing Caucasian male patient. Well-nourished well-developed; Alert, no distress.  EYES: no pallor or icterus OROPHARYNX: no thrush or ulceration;poor dentition  NECK: supple, no masses felt LYMPH:  no palpable lymphadenopathy in the cervical, axillary or inguinal regions LUNGS: clear to auscultation and  No wheeze or crackles HEART/CVS: regular rate & rhythm and no murmurs; No lower extremity  edema ABDOMEN: abdomen soft, non-tender and normal bowel sounds Musculoskeletal:no cyanosis of digits and no clubbing  PSYCH: alert & oriented x 3 with fluent speech NEURO: no focal motor/sensory deficits SKIN:  no rashes or significant lesions; mild swelling erythema noted of the right index finger.  LABORATORY DATA:  I have reviewed the data as listed Lab Results  Component Value  Date   WBC 5.4 12/23/2014   HGB 12.2* 12/23/2014   HCT 37.2* 12/23/2014   MCV 95.2 12/23/2014   PLT 221 12/23/2014    Recent Labs  08/04/14 1006  10/28/14 0852  12/09/14 0943 12/23/14 1145 12/24/14 0410  NA  --   < > 141  < > 135 138 138  K  --   < > 3.4*  < > 4.1 3.8 4.4  CL  --   < > 104  < > 100* 103 106  CO2  --   < > 32  < > 30 27 26   GLUCOSE  --   < > 93  < > 103* 123* 147*  BUN  --   < > 8  < > 17 19 19   CREATININE  --   < > 0.85  < > 0.83 1.35* 0.94  CALCIUM  --   < > 7.9*  < > 8.7* 8.3* 7.5*  GFRNONAA  --   < > >60  < > >60 57* >60  GFRAA  --   < > >60  < > >60 >60 >60  PROT 5.9*  --  5.8*  --   --  6.6 5.3*  ALBUMIN 3.3*  --  3.0*  --   --  3.2* 2.5*  AST 13*  --  10*  --   --  68* 43*  ALT 10*  --  9*  --   --  30 26  ALKPHOS 29*  --  29*  --   --  39 28*  BILITOT 0.5  --  0.3  --   --  0.2* <0.1*  BILIDIR 0.1  --  <0.1*  --   --  0.1  --   IBILI 0.4  --  NOT CALCULATED  --   --  0.1*  --   < > = values in this interval not displayed.  RADIOGRAPHIC STUDIES: I have personally reviewed the radiological images as listed and agreed with the findings in the report. Ct Head Wo Contrast  12/23/2014  CLINICAL DATA:  Fever, chills and cough for 3 days. Patient has history of multiple myeloma. EXAM: CT HEAD WITHOUT CONTRAST TECHNIQUE: Contiguous axial images were obtained from the base of the skull through the vertex without intravenous contrast. COMPARISON:  May 29, 2012 FINDINGS: There is no midline shift hydrocephalus or mass. No acute hemorrhage or acute transcortical infarct is  identified. The bony calvarium is intact. The visualized sinuses are clear. IMPRESSION: No focal acute intracranial abnormality identified. Electronically Signed   By: Abelardo Diesel M.D.   On: 12/23/2014 17:15   Dg Hand 2 View Left  12/23/2014  CLINICAL DATA:  Left pointer finger swelling and pain radiating to the left wrist. EXAM: LEFT HAND - 2 VIEW COMPARISON:  None. FINDINGS: There is no evidence of fracture or dislocation. There is no evidence of arthropathy or other focal bone abnormality. Soft tissues are unremarkable. IMPRESSION: Negative. Electronically Signed   By: Abelardo Diesel M.D.   On: 12/23/2014 14:01   Dg Chest Portable 1 View  12/23/2014  CLINICAL DATA:  Shortness of breath for 3 days, productive cough, back pain with coughing, history multiple myeloma post chemotherapy most recently 12/09/2014, asthma, COPD, coronary disease post MI, CHF, hypertension, pacemaker EXAM: PORTABLE CHEST 1 VIEW COMPARISON:  Portable exam 1331 hours compared to 01/16/2014 FINDINGS: LEFT subclavian AICD with lead projecting over RIGHT ventricle. Upper normal size of cardiac silhouette. Tortuosity of thoracic aorta. Mediastinal  contours and pulmonary vascularity otherwise normal. Lungs grossly clear. No pleural effusion or pneumothorax. Bones demineralized. IMPRESSION: No acute abnormalities. Electronically Signed   By: Lavonia Dana M.D.   On: 12/23/2014 14:01    ASSESSMENT & PLAN:   54 year old male patient with a history of multiple myeloma on maintenance Revlimid is currently admitted to the hospital for chills/rigors/hypotension- concerning for sepsis  # Sepsis/SIRS in immunocompromised patient/multiple myeloma. Clinically improving-off pressors. Agree with broad-spectrum antibiotics; await blood cultures. If patient does not improve clinically/ recommend further imaging with CT scan chest abdomen pelvis.  # Multiple myeloma- on maintenance Revlimid- most recent M protein 0.5 g/dL in September 2016.  Recommend continue to hold Revlimid.   The above plan of care was discussed with patient detail. We'll continue to follow while the patient is the hospital. I reviewed the chest x-ray independently.  Thank you Dr. Posey Pronto for allowing me to participate in the care of the patient. Please do not hesitate to contact me if any questions or concerns.     Cammie Sickle, MD 12/24/2014 12:25 PM

## 2014-12-24 NOTE — Progress Notes (Signed)
Pharmacy Antibiotic Time-Out Note  [name] is a 54 y.o. year-old male admitted on 12/23/2014.  The patient is currently on Levofloxacin, Zosyn, and Vancomycin for Sepsis/PNA.  Assessment/Plan: Recommended discontinuation of either Zosyn or Levofloxacin due to duplication of therapy. MD discontinued Zosyn.   Temp (24hrs), Avg:97.6 F (36.4 C), Min:97.4 F (36.3 C), Max:97.9 F (36.6 C)   Recent Labs Lab 12/23/14 1017 12/23/14 1145  WBC 5.6 5.4    Recent Labs Lab 12/23/14 1145 12/24/14 0410  CREATININE 1.35* 0.94   Estimated Creatinine Clearance: 76.6 mL/min (by C-G formula based on Cr of 0.94).    Antimicrobials this admission: 11/30 >> Zosyn 11/30 >> Vancomycin 11/30 >> Levofloxacin   Microbiology Results: 11/30 BCx: *NG X1 day 12/1 UCx: pending  11/30 Sputum: Sent 11/30 MRSA PCR: negative   Thank you for allowing pharmacy to be a part of this patient's care.  Pernell Dupre PharmD 12/24/2014 12:38 PM

## 2014-12-24 NOTE — Consult Note (Addendum)
WOC wound consult note Reason for Consult: Left index finger ulceration, trauma wound. Per patient LEft hip unstageable pressure injury,present on admission Hx multiple myeloma  Wound type:unstageable pressure injury, trauma wound left hand   Pressure Ulcer POA: Yes Measurement:Left finger lesion at base of finer  2 cm x 3.2 cm x 0.1 cm and lesion near tip of same finger 0.5 cm in diameter patient states he was removing band aid with pocket knife and made the wound worse.  Appears to be ruptured blisters or peeling epithelium Left trochanter 1 cm x 1 cm 100% slough wound bed with erythema present circumferentially, extending 1 cm  Wound bed: left finger pink and moist Left hip adherent slough Drainage (amount, consistency, odor) moderate serosanguinous drainage  No odor.  Periwound:erythema and edema to left index finger Erythema and maroon discoloration to left trochanter Dressing procedure/placement/frequency:Cleanse left index finger with NS and pat gently dry,  Apply Silvadene cream to open lesions.  Cover with nonadherent dressing.  Secure with kerlix and tape.  Change daily.   Cleanse left trochanter wound with NS and pat gently dry.  Apply Santyl cream to wound bed. Cover with NS moist dressing.  Secure with 4x4 gauze and tape.  Change daily.   Will not follow at this time.  Please re-consult if needed.  Domenic Moras RN BSN [place] Pager [phone]

## 2014-12-24 NOTE — Consult Note (Signed)
Patient ID: [name], male   DOB: September 28, 1960, 54 y.o.   MRN: [id number]  CC: Weakness  HPI SAMER DUTTON is a 54 y.o. male admitted to the medicine department for hypotension, fevers, chills, cough, weakness. Consult to surgery called for evaluation of left thigh wound. Patient unable to give accurate history of how long wound is been there but knows it is there. He states he is continuing to feel weak and have a cough however his primary complaint is actually of pain from his left index finger. Patient also continues to have cough, shortness breath, weakness. He has a known history of multiple myeloma and is undergoing treatment for this.  HPI  Past Medical History  Diagnosis Date  . Multiple myeloma (HCC)     No past surgical history on file.  No family history on file.  Social History Social History  Substance Use Topics  . Smoking status: Current Every Day Smoker -- 0.50 packs/day for 40 years    Types: Cigarettes  . Smokeless tobacco: None  . Alcohol Use: None    No Known Allergies  Current Facility-Administered Medications  Medication Dose Route Frequency Provider Last Rate Last Dose  . 0.9 %  sodium chloride infusion   Intravenous Continuous Theodoro Grist, MD 75 mL/hr at 12/24/14 1400    . acetaminophen (TYLENOL) tablet 650 mg  650 mg Oral Q6H PRN Theodoro Grist, MD       Or  . acetaminophen (TYLENOL) suppository 650 mg  650 mg Rectal Q6H PRN Theodoro Grist, MD      . albuterol (PROVENTIL) (2.5 MG/3ML) 0.083% nebulizer solution 2.5 mg  2.5 mg Nebulization Q4H Theodoro Grist, MD   2.5 mg at 12/24/14 1542  . amiodarone (PACERONE) tablet 200 mg  200 mg Oral Daily Theodoro Grist, MD   200 mg at 12/24/14 1131  . antiseptic oral rinse (CPC / CETYLPYRIDINIUM CHLORIDE 0.05%) solution 7 mL  7 mL Mouth Rinse BID Theodoro Grist, MD   7 mL at 12/24/14 1000  . aspirin EC tablet 325 mg  325 mg Oral Daily Theodoro Grist, MD   325 mg at 12/24/14 1303  . atorvastatin (LIPITOR) tablet 20 mg   20 mg Oral Daily Theodoro Grist, MD   20 mg at 12/24/14 1130  . collagenase (SANTYL) ointment   Topical Daily Fritzi Mandes, MD   1 application at 32/44/01 1015  . docusate sodium (COLACE) capsule 100 mg  100 mg Oral BID Theodoro Grist, MD   100 mg at 12/24/14 1130  . enoxaparin (LOVENOX) injection 40 mg  40 mg Subcutaneous Q24H Theodoro Grist, MD   40 mg at 12/23/14 1843  . feeding supplement (ENSURE ENLIVE) (ENSURE ENLIVE) liquid 237 mL  237 mL Oral TID WC Fritzi Mandes, MD   237 mL at 12/24/14 1200  . gabapentin (NEURONTIN) capsule 100 mg  100 mg Oral TID Theodoro Grist, MD   100 mg at 12/24/14 1131  . hydrocortisone sodium succinate (SOLU-CORTEF) 100 MG injection 100 mg  100 mg Intravenous Q8H Theodoro Grist, MD   100 mg at 12/24/14 0801  . levofloxacin (LEVAQUIN) IVPB 500 mg  500 mg Intravenous Q24H Ramond Dial, RPH   500 mg at 12/24/14 1356  . nicotine (NICODERM CQ - dosed in mg/24 hours) patch 21 mg  21 mg Transdermal Daily Theodoro Grist, MD   21 mg at 12/23/14 1809  . norepinephrine (LEVOPHED) 4 mg in dextrose 5 % 250 mL (0.016 mg/mL) infusion  2 mcg/min Intravenous  Titrated Theodoro Grist, MD   Stopped at 12/23/14 1643  . ondansetron (ZOFRAN) tablet 4 mg  4 mg Oral Q6H PRN Theodoro Grist, MD       Or  . ondansetron (ZOFRAN) injection 4 mg  4 mg Intravenous Q6H PRN Theodoro Grist, MD      . oxyCODONE (Oxy IR/ROXICODONE) immediate release tablet 5 mg  5 mg Oral Q3H PRN Theodoro Grist, MD   5 mg at 12/24/14 1348  . polyethylene glycol (MIRALAX / GLYCOLAX) packet 17 g  17 g Oral Daily Theodoro Grist, MD   17 g at 12/24/14 1131  . promethazine (PHENERGAN) tablet 25 mg  25 mg Oral Q4H PRN Theodoro Grist, MD      . silver sulfADIAZINE (SILVADENE) 1 % cream   Topical Daily Fritzi Mandes, MD   1 application at 79/02/40 1015  . sodium chloride 0.9 % injection 3 mL  3 mL Intravenous Q12H Theodoro Grist, MD   3 mL at 12/24/14 1000  . tiotropium (SPIRIVA) inhalation capsule 18 mcg  18 mcg Inhalation Daily Theodoro Grist, MD   18 mcg at 12/24/14 0800  . vancomycin (VANCOCIN) 1,250 mg in sodium chloride 0.9 % 250 mL IVPB  1,250 mg Intravenous Q18H Sheema M Hallaji, RPH   1,250 mg at 12/24/14 1304     Review of Systems A multi-point review of systems was asked and was negative except for the positive findings documented in the history of present illness.  Physical Exam Blood pressure 110/67, pulse 84, temperature 98.3 F (36.8 C), temperature source Oral, resp. rate 10, height 6' 1" (1.854 m), weight 61.689 kg (136 lb), SpO2 98 %. CONSTITUTIONAL: Resting in bed in no acute distress. EYES: Pupils are equal, round, and reactive to light, Sclera are non-icteric. EARS, NOSE, MOUTH AND THROAT: The oropharynx is clear. The oral mucosa is pink and moist. Hearing is intact to voice. LYMPH NODES:  Lymph nodes in the neck are normal. RESPIRATORY:  Lungs are clear. There is normal respiratory effort, with equal breath sounds bilaterally, and without pathologic use of accessory muscles. CARDIOVASCULAR: Heart is regular without murmurs, gallops, or rubs. GI: The abdomen is soft, nontender, and nondistended. There are no palpable masses. There is no hepatosplenomegaly. There are normal bowel sounds in all quadrants. GU: Rectal deferred.   MUSCULOSKELETAL: Normal muscle strength and tone. No cyanosis or edema.   SKIN: Left trochanteric wound with approximate 2 cm area of granulation tissue with minimal circumferential erythema. Ears to have fibrous exudate center of the wound but no visible pus. Of note the wound had just been cleaned and dressed upon my evaluation. Patient with also a left index finger wound also measuring about 2 cm with surrounding erythema. NEUROLOGIC: Motor and sensation is grossly normal. Cranial nerves are grossly intact. PSYCH:  Oriented to person, place and time. Affect is normal.  Data Reviewed Reviewed the patient's images and labs. His labs are primarily within normal limits without a  leukocytosis. His images are of his chest and do not show any pathology. I have personally reviewed the patient's imaging, laboratory findings and medical records.    Assessment    54 year old male with multiple myeloma admitted for hypotension. Chronic wound to the left trochanteric an acute wound to the left index finger    Plan    Agree with wound care nurses evaluation from earlier today. Wound to left trochanter currently looks clean however had just been dressed prior to my evaluation. Would continue wet-to-dry dressings. No  current indications for surgical debridement. Does not appear to have a superficial infection associated with it at this time. Left index finger with erythema appears to be an infected traumatic wound. This will cause pain and discomfort but is an unlikely source for sepsis or hypotension. If no other source can be found for his current hypotension these wounds would most likely be infected with gram-positive organisms. We'll follow culture results, however, no current surgical intervention called for.     Time spent with the patient was 30 minutes, with more than 50% of the time spent in face-to-face education, counseling and care coordination.     Clayburn Pert 12/24/2014, 3:49 PM

## 2014-12-25 DIAGNOSIS — L539 Erythematous condition, unspecified: Secondary | ICD-10-CM

## 2014-12-25 DIAGNOSIS — M79605 Pain in left leg: Secondary | ICD-10-CM

## 2014-12-25 DIAGNOSIS — R5381 Other malaise: Secondary | ICD-10-CM

## 2014-12-25 DIAGNOSIS — M79601 Pain in right arm: Secondary | ICD-10-CM

## 2014-12-25 DIAGNOSIS — M79604 Pain in right leg: Secondary | ICD-10-CM

## 2014-12-25 DIAGNOSIS — M7989 Other specified soft tissue disorders: Secondary | ICD-10-CM

## 2014-12-25 DIAGNOSIS — M79602 Pain in left arm: Secondary | ICD-10-CM

## 2014-12-25 MED ORDER — ALPRAZOLAM 0.25 MG PO TABS
0.2500 mg | ORAL_TABLET | Freq: Two times a day (BID) | ORAL | Status: DC | PRN
  Administered 2014-12-25 – 2014-12-26 (×3): 0.25 mg via ORAL
  Filled 2014-12-25 (×3): qty 1

## 2014-12-25 MED ORDER — OXYCODONE HCL 5 MG PO TABS
10.0000 mg | ORAL_TABLET | ORAL | Status: DC | PRN
  Administered 2014-12-26: 10:00:00 10 mg via ORAL
  Filled 2014-12-25: qty 2

## 2014-12-25 MED ORDER — OXYCODONE HCL 5 MG PO TABS
10.0000 mg | ORAL_TABLET | ORAL | Status: DC | PRN
Start: 2014-12-25 — End: 2014-12-25
  Administered 2014-12-25: 10 mg via ORAL
  Filled 2014-12-25: qty 2

## 2014-12-25 MED ORDER — HYDROMORPHONE HCL 2 MG PO TABS
2.0000 mg | ORAL_TABLET | ORAL | Status: DC | PRN
  Administered 2014-12-25 – 2014-12-26 (×5): 2 mg via ORAL
  Filled 2014-12-25 (×5): qty 1

## 2014-12-25 MED ORDER — MORPHINE SULFATE (PF) 2 MG/ML IV SOLN
2.0000 mg | INTRAVENOUS | Status: DC | PRN
  Administered 2014-12-25 – 2014-12-26 (×3): 2 mg via INTRAVENOUS
  Filled 2014-12-25 (×3): qty 1

## 2014-12-25 MED ORDER — HYDROCORTISONE NA SUCCINATE PF 100 MG IJ SOLR
100.0000 mg | Freq: Every day | INTRAMUSCULAR | Status: DC
  Administered 2014-12-26: 10:00:00 100 mg via INTRAVENOUS
  Filled 2014-12-25 (×2): qty 2

## 2014-12-25 MED ORDER — OXYCODONE HCL 5 MG PO TABS
10.0000 mg | ORAL_TABLET | ORAL | Status: DC | PRN
  Filled 2014-12-25: qty 2

## 2014-12-25 MED ORDER — AMOXICILLIN-POT CLAVULANATE 875-125 MG PO TABS
1.0000 | ORAL_TABLET | Freq: Two times a day (BID) | ORAL | Status: DC
  Administered 2014-12-25 – 2014-12-27 (×5): 1 via ORAL
  Filled 2014-12-25 (×6): qty 1

## 2014-12-25 MED ORDER — ALBUTEROL SULFATE (2.5 MG/3ML) 0.083% IN NEBU
2.5000 mg | INHALATION_SOLUTION | Freq: Three times a day (TID) | RESPIRATORY_TRACT | Status: DC
  Administered 2014-12-25 – 2014-12-26 (×3): 2.5 mg via RESPIRATORY_TRACT
  Filled 2014-12-25 (×3): qty 3

## 2014-12-25 NOTE — Care Management Note (Signed)
Case Management Note  Patient Details  Name: [name] MRN: [id number] Date of Birth: [birth year]  Subjective/Objective:  [facility] consult received for discharge planning. Met with patient at bedside. Admitted with sepsis, patient has multiple myeloma. He is not receiving chemo or radiation at this time.  His oncologist has been Dr. Michaelyn Barter but he now has a new MD he calls "Dr. Jacinto Reap"  In the same [facility] practice. Patient lives at home with his girlfriend. She provides him transportation back and forth to appointments. His PCP is at [facility].  He does not use assistive devices but feels he will need one at discharge. He has home O2 @ 2L/ [place] through [place]. Patient has a thigh wound that his girlfriend has been changing the dressing for. He has had a surgical consult with no surgery planned. Discussed home health services if needed at discharge. Patient is agreeable. Offered choice of agencies. He has no preference and is open to any agency. Denies issues obtaniing medications, medical care, transportation or other financial concerns.                  Action/Plan: Will monitor for home discharge needs. Recommend PT consult for assessment of DME needs.   Expected Discharge Date:                  Expected Discharge Plan:  [place]  In-House Referral:     Discharge planning Services  CM Consult  Post Acute Care Choice:  Home Health Choice offered to:  Patient  DME Arranged:    DME Agency:     HH Arranged:    [place] Agency:     Status of Service:  In process, will continue to follow  Medicare Important Message Given:    Date Medicare IM Given:    Medicare IM give by:    Date Additional Medicare IM Given:    Additional Medicare Important Message give by:     If discussed at [place] of Stay Meetings, dates discussed:    Additional Comments:  Jolly Mango, RN 12/25/2014, 11:36 AM

## 2014-12-25 NOTE — Plan of Care (Signed)
Problem: Respiratory: Goal: Ability to maintain adequate ventilation will improve Outcome: Progressing Transfer from CCU. VSS.Pain medications given with noted relief. Foley in place and draining. Telemetry monitored.

## 2014-12-25 NOTE — Progress Notes (Signed)
Gen. surgery evaluation  Revisiting with patient to evaluate his left thigh wound. Patient reports feeling about the same as he was in the ICU recounted now that the wound has been there at least for a month.  On exam the wound is still unchanged. There is a small area of granulation tissue in the center with circumferential limited erythema. No active purulence or stressful drainage. Wound is on the bony prominence.  Assessment and plan: Continue local wound care. No indication for surgical debridement at this time. Should his wound worsen or he develop an additional surgical need please call again for further evaluation.  Clayburn Pert, MD FACS General Surgeon [facility]

## 2014-12-25 NOTE — Progress Notes (Signed)
Patient ID: [name], male   DOB: Aug 13, 1960, 54 y.o.   MRN: [id number] [place] [facility] [place] NAME: [name]    MR#:  [id number]  DATE OF BIRTH:  09/14/1960  SUBJECTIVE:  Feels weak Generalized bodyaches. Not motivated to do anythinh REVIEW OF SYSTEMS:   Review of Systems  Constitutional: Negative for fever, chills and weight loss.  HENT: Negative for ear discharge, ear pain and nosebleeds.   Eyes: Negative for blurred vision, pain and discharge.  Respiratory: Positive for cough and shortness of breath. Negative for sputum production, wheezing and stridor.   Cardiovascular: Negative for chest pain, palpitations, orthopnea and PND.  Gastrointestinal: Negative for nausea, vomiting, abdominal pain and diarrhea.  Genitourinary: Negative for urgency and frequency.  Musculoskeletal: Negative for back pain and joint pain.  Neurological: Positive for weakness. Negative for sensory change, speech change and focal weakness.  Psychiatric/Behavioral: Negative for depression and hallucinations. The patient is not nervous/anxious.   All other systems reviewed and are negative.  Tolerating Diet:yes Tolerating PT: pending  DRUG ALLERGIES:  No Known Allergies  VITALS:  Blood pressure 128/82, pulse 74, temperature 98.2 F (36.8 C), temperature source Oral, resp. rate 18, height 6' 1"  (1.854 m), weight 61.689 kg (136 lb), SpO2 93 %.  PHYSICAL EXAMINATION:   Physical Exam  GENERAL:  54 y.o.-year-old patient lying in the bed with no acute distress. Appears chronically ill EYES: Pupils equal, round, reactive to light and accommodation. No scleral icterus. Extraocular muscles intact.  HEENT: Head atraumatic, normocephalic. Oropharynx and nasopharynx clear.  NECK:  Supple, no jugular venous distention. No thyroid enlargement, no tenderness.  LUNGS: Normal breath sounds bilaterally, no wheezing, rales, rhonchi. No use of accessory muscles of  respiration.  CARDIOVASCULAR: S1, S2 normal. No murmurs, rubs, or gallops.  ABDOMEN: Soft, nontender, nondistended. Bowel sounds present. No organomegaly or mass.  EXTREMITIES: No cyanosis, clubbing or edema b/l.   Left thigh lesion about 2x3 cm with pus Left 1st digit mild erythema NEUROLOGIC: Cranial nerves II through XII are intact. No focal Motor or sensory deficits b/l.  Weak,frail PSYCHIATRIC: The patient is alert and oriented x 3.  SKIN: No obvious rash, lesion, or ulcer.    LABORATORY PANEL:   CBC  Recent Labs Lab 12/23/14 1145  WBC 5.4  HGB 12.2*  HCT 37.2*  PLT 221    Chemistries   Recent Labs Lab 12/23/14 1145 12/24/14 0410  NA 138 138  K 3.8 4.4  CL 103 106  CO2 27 26  GLUCOSE 123* 147*  BUN 19 19  CREATININE 1.35* 0.94  CALCIUM 8.3* 7.5*  MG 1.6*  --   AST 68* 43*  ALT 30 26  ALKPHOS 39 28*  BILITOT 0.2* <0.1*    Cardiac Enzymes  Recent Labs Lab 12/23/14 1145  TROPONINI 0.03    RADIOLOGY:  No results found. ASSESSMENT AND PLAN:  [name] is a 54 y.o. male with a known history of multiple myeloma who presents to the hospital with complaints of fevers, chills, cough for the past 3 days with green phlegm, wheezing since last night feeling weak and tired  1. Sepsis due to acute bronchitis - continue IV fluids -off Levophed to keep patient's MAP above 65 -pt runs low BP 90-100 -IVF -not on any pressors at present -BC 1/2  GPC in aerobic-coagualse neg staph -d/c vanc and zosyn--->po augmentin for thigh (chronic ) and finger wwound   2. Acute hypoxic respiratory  failure due to COPD exacerbation, -wean oxygen down -improving  3. Left thigh ulcer/esion with chronic pus for 1 month -induration+ -no surgical indication. -cont wound dressing changes  4. Renal insufficiency -resolved  5. Multiple myeloma with chronic pain syndrome, hold Revlimid get oncologist involved for further recommendations -now on po dilaudid and fentanyl  patch -chronic pain issues  6. Metabolic encephalopathy due to hypotension, hyoxia Ct head neg -resolved  7. Tobacco abuse counseling. Discussed with patient's family for 3 minutes. Nicotine replacement therapy will be initiated   Transfer to med floor PT to see pt CM for d/c planning  Case discussed with Care Management/Social Worker. Management plans discussed with the patient, family and they are in agreement.  CODE STATUS: full  DVT Prophylaxis: lovneox  TOTAL criticalTIME TAKING CARE OF THIS PATIENT: 35 minutes.  >50% time spent on counselling and coordination of care  POSSIBLE D/C IN 2-3 DAYS, DEPENDING ON CLINICAL CONDITION.   [name] M.D on 12/25/2014 at 5:49 PM  Between 7am to 6pm - Pager - [phone]  After 6pm go to [email or web address] - password [email or web address]  Office  [phone]  CC: Primary care physician; [name] Kuba, MD

## 2014-12-25 NOTE — Progress Notes (Signed)
[name]   DOB:[birth year]   JA#:[id number]    Subjective: Patient still complains of chills/rigors. Complains of pain in his lower extremities; arms.  No new cough or shortness of breath. He still feels poorly; but improved since admission.  ROS: Denies any fevers or chest pain. Objective:  Filed Vitals:   12/25/14 0900 12/25/14 1100  BP: 106/70 111/63  Pulse: 77 75  Temp:    Resp: 18 18     Intake/Output Summary (Last 24 hours) at 12/25/14 1208 Last data filed at 12/25/14 1000  Gross per 24 hour  Intake   2253 ml  Output   1150 ml  Net   1103 ml    GENERAL: sick appearing Caucasian male patient. Well-nourished well-developed; Alert, no distress. Accompanied by family.  EYES: no pallor or icterus OROPHARYNX: no thrush or ulceration;poor dentition  NECK: supple, no masses felt LYMPH: no palpable lymphadenopathy in the cervical, axillary or inguinal regions LUNGS: clear to auscultation and No wheeze or crackles HEART/CVS: regular rate & rhythm and no murmurs; No lower extremity edema ABDOMEN: abdomen soft, non-tender and normal bowel sounds Musculoskeletal:no cyanosis of digits and no clubbing  PSYCH: alert & oriented x 3 with fluent speech NEURO: no focal motor/sensory deficits SKIN: no rashes or significant lesions; mild swelling erythema noted of the right index finger.   Labs:  Lab Results  Component Value Date   WBC 5.4 12/23/2014   HGB 12.2* 12/23/2014   HCT 37.2* 12/23/2014   MCV 95.2 12/23/2014   PLT 221 12/23/2014   NEUTROABS 4.3 12/23/2014    Lab Results  Component Value Date   NA 138 12/24/2014   K 4.4 12/24/2014   CL 106 12/24/2014   CO2 26 12/24/2014    Studies:  Ct Head Wo Contrast  12/23/2014  CLINICAL DATA:  Fever, chills and cough for 3 days. Patient has history of multiple myeloma. EXAM: CT HEAD WITHOUT CONTRAST TECHNIQUE: Contiguous axial images were obtained from the base of the skull through the vertex without intravenous contrast.  COMPARISON:  May 29, 2012 FINDINGS: There is no midline shift hydrocephalus or mass. No acute hemorrhage or acute transcortical infarct is identified. The bony calvarium is intact. The visualized sinuses are clear. IMPRESSION: No focal acute intracranial abnormality identified. Electronically Signed   By: Abelardo Diesel M.D.   On: 12/23/2014 17:15   Dg Hand 2 View Left  12/23/2014  CLINICAL DATA:  Left pointer finger swelling and pain radiating to the left wrist. EXAM: LEFT HAND - 2 VIEW COMPARISON:  None. FINDINGS: There is no evidence of fracture or dislocation. There is no evidence of arthropathy or other focal bone abnormality. Soft tissues are unremarkable. IMPRESSION: Negative. Electronically Signed   By: Abelardo Diesel M.D.   On: 12/23/2014 14:01   Dg Chest Portable 1 View  12/23/2014  CLINICAL DATA:  Shortness of breath for 3 days, productive cough, back pain with coughing, history multiple myeloma post chemotherapy most recently 12/09/2014, asthma, COPD, coronary disease post MI, CHF, hypertension, pacemaker EXAM: PORTABLE CHEST 1 VIEW COMPARISON:  Portable exam 1331 hours compared to 01/16/2014 FINDINGS: LEFT subclavian AICD with lead projecting over RIGHT ventricle. Upper normal size of cardiac silhouette. Tortuosity of thoracic aorta. Mediastinal contours and pulmonary vascularity otherwise normal. Lungs grossly clear. No pleural effusion or pneumothorax. Bones demineralized. IMPRESSION: No acute abnormalities. Electronically Signed   By: Lavonia Dana M.D.   On: 12/23/2014 14:01    Assessment & Plan:   54 year old male patient with  a history of multiple myeloma on maintenance Revlimid is currently admitted to the hospital for chills/rigors/hypotension- concerning for sepsis  # Sepsis/SIRS in immunocompromised patient/multiple myeloma. Clinically improving-off pressors. On broad-spectrum antibiotics [levofloxacin and Zosyn plus vancomycin]. Blood culture positive for coag negative staph/likely  contaminant.  If patient does not improve clinically/ recommend further imaging with CT scan chest abdomen pelvis. Check CRP.  # Multiple myeloma- on maintenance Revlimid- most recent M protein 0.5 g/dL in September 2016. Recommend continue to hold Revlimid/dexamethasone.  The above plan reviewed with the patient/family; and the nurse.   Cammie Sickle, MD 12/25/2014  12:08 PM

## 2014-12-25 NOTE — Progress Notes (Signed)
PT Cancellation Note  Patient Details Name: [name] MRN: [id number] DOB: 10/29/1960   Cancelled Treatment:    Reason Eval/Treat Not Completed: Fatigue/lethargy limiting ability to participate (Chart reviewed for attempted evaluation.  Patient just returned from bathroom with nursing; reports being "too wiped out" for additional activity at this time.  Per RN, patient very weak and deconditioned.  RW left in room for use as needed.  Will re-attempt at later time/date as medically appropriate.)  Bee Marchiano H. Owens Shark, PT, DPT, NCS 12/25/2014, 3:11 PM [phone]

## 2014-12-26 LAB — URINE CULTURE: CULTURE: NO GROWTH

## 2014-12-26 MED ORDER — HYDROMORPHONE HCL 2 MG PO TABS
2.0000 mg | ORAL_TABLET | ORAL | Status: DC | PRN
  Administered 2014-12-26 – 2014-12-27 (×6): 2 mg via ORAL
  Filled 2014-12-26 (×6): qty 1

## 2014-12-26 MED ORDER — ALPRAZOLAM 0.25 MG PO TABS
0.2500 mg | ORAL_TABLET | Freq: Four times a day (QID) | ORAL | Status: DC | PRN
  Administered 2014-12-26 – 2014-12-27 (×4): 0.25 mg via ORAL
  Filled 2014-12-26 (×4): qty 1

## 2014-12-26 MED ORDER — ALBUTEROL SULFATE (2.5 MG/3ML) 0.083% IN NEBU: 2.5000 mg | INHALATION_SOLUTION | Freq: Four times a day (QID) | RESPIRATORY_TRACT | Status: DC | PRN

## 2014-12-26 NOTE — Plan of Care (Signed)
Problem: Skin Integrity: Goal: Risk for impaired skin integrity will decrease Outcome: Progressing Pt was having uncontrolled pain at the beginning of shift. Paged and spoke with Dr. Lavetta Nielsen and new orders were placed. Pt pain is better controlled and pt is resting better during the shift. Pt was tearful once during shift, reporting he felt like something was holding him down, offer support to pt. Pt verbalize regrets about the past, asks pt if he would like to chaplin to come visit but pt refused at the time, offer comfort to pt. Stayed with pt for an extended period of time and prayed with pt per pt request. No other signs of distress noted. Will continue to monitor.

## 2014-12-26 NOTE — Progress Notes (Signed)
PT Cancellation Note  Patient Details Name: [name] MRN: [id number] DOB: 1960/09/26   Cancelled Treatment:    Reason Eval/Treat Not Completed: Pain limiting ability to participate;Fatigue/lethargy limiting ability to participate (Nursing is with pt reporting anxiety).  Will try later as time allows.   Ramond Dial 12/26/2014, 10:38 AM   Mee Hives, PT MS Acute Rehab Dept. Number: [facility] [id number] and [place] [phone]

## 2014-12-26 NOTE — Plan of Care (Signed)
Problem: Fluid Volume: Goal: Hemodynamic stability will improve Outcome: Progressing IVF infusing.   Problem: Physical Regulation: Goal: Signs and symptoms of infection will decrease Outcome: Completed/Met Date Met:  12/26/14 WBC normal.   Problem: Respiratory: Goal: Ability to maintain adequate ventilation will improve Outcome: Progressing Patient on 3L of O2. Pt SOB with exertion.  Problem: Safety: Goal: Ability to remain free from injury will improve Outcome: Progressing Patient calls out for needs. High falls risk. Standby assist with walker. PT evaluation done.     Problem: Pain Managment: Goal: General experience of comfort will improve Outcome: Progressing Dilaudid changed to q3h.  Xanax changed to 4x/day.  Patient is tearful today notified MD on rounds. Sat with patient as he teared up this afternoon. Declined chaplain services at this time. Girlfriend is very supportive.  Problem: Skin Integrity: Goal: Risk for impaired skin integrity will decrease Outcome: Progressing Dressing changes completed. Turning patient and encouraging him to turn on his own. Pink foam dressing applied to right hip area for protection.

## 2014-12-26 NOTE — Progress Notes (Signed)
   12/26/14 1900  Clinical Encounter Type  Visited With Patient  Visit Type Initial  Referral From Nurse  Spiritual Encounters  Spiritual Needs Prayer  Stress Factors  Patient Stress Factors Health changes  Patient and chaplain talked about health of patient. Chaplain offered prayers and support. Patient expressed his difficulties and his goals regarding his health.

## 2014-12-26 NOTE — Evaluation (Signed)
Physical Therapy Evaluation Patient Details Name: [name] MRN: [id number] DOB: November 02, 1960 Today's Date: 12/26/2014   History of Present Illness  54 yo male wtih onset of mult myeloma and bronchitis, recently began to use O2 for SOB.  Has Cough, fatigue and has been on 3L O2 and has live in help  Clinical Impression  Pt has been up in room with nursing but needs continual assistance at a small amount of control of gait and transfers.  He is going home with his girlfriend who helps care for him but she is going to be limited for assistance.  Will need HHPT to follow up with walker as needed to help with safe mobility and may need to progress on to outpatient as needed.    Follow Up Recommendations Home health PT;Supervision/Assistance - 24 hour    Equipment Recommendations  Other (comment) (RW only if pt has not received one in good condition)    Recommendations for Other Services       Precautions / Restrictions Precautions Precautions: Fall Precaution Comments: anxiety with all mobility Restrictions Weight Bearing Restrictions: No      Mobility  Bed Mobility Overal bed mobility: Needs Assistance Bed Mobility: Supine to Sit;Sit to Supine     Supine to sit: Min assist Sit to supine: Min assist   General bed mobility comments: to finish scooting out to edge of bed  Transfers Overall transfer level: Needs assistance Equipment used: Rolling walker (2 wheeled);1 person hand held assist Transfers: Sit to/from [facility] Sit to Stand: Min assist Stand pivot transfers: Min guard;Min assist       General transfer comment: reminders for hand placement and assist to power up  Ambulation/Gait Ambulation/Gait assistance: Min guard;Min assist Ambulation Distance (Feet): 45 Feet Assistive device: Rolling walker (2 wheeled);1 person hand held assist Gait Pattern/deviations: Step-through pattern;Decreased dorsiflexion - left;Decreased dorsiflexion -  right;Trunk flexed;Wide base of support Gait velocity: reduced Gait velocity interpretation: Below normal speed for age/gender General Gait Details: guarded posture and weakness in hips to control gait  Stairs            Wheelchair Mobility    Modified Rankin (Stroke Patients Only)       Balance Overall balance assessment: Needs assistance Sitting-balance support: Feet supported Sitting balance-Leahy Scale: Fair   Postural control: Posterior lean Standing balance support: Bilateral upper extremity supported Standing balance-Leahy Scale: Poor                               Pertinent Vitals/Pain Pain Assessment: No/denies pain    Home Living Family/patient expects to be discharged to:: Private residence Living Arrangements: Spouse/significant other Available Help at Discharge: Friend(s);Available 24 hours/day Type of Home: House Home Access: Stairs to enter Entrance Stairs-Rails: None Entrance Stairs-Number of Steps: 4 Home Layout: One level Home Equipment: Environmental consultant - 2 wheels      Prior Function Level of Independence: Independent with assistive device(s)         Comments: recent addition of O2     Hand Dominance        Extremity/Trunk Assessment   Upper Extremity Assessment: Overall WFL for tasks assessed           Lower Extremity Assessment: Generalized weakness      Cervical / Trunk Assessment: Normal  Communication   Communication: No difficulties  Cognition Arousal/Alertness: Awake/alert Behavior During Therapy: WFL for tasks assessed/performed Overall Cognitive Status: Within Functional Limits for  tasks assessed                      General Comments General comments (skin integrity, edema, etc.): Pt is getting up to bedside with help and needs less so to return to bed, very anxious with any walking or attempts to leave room.  Nursing has medicated pt for his anxiety, has improved his outlook with girlfriend with  him    Exercises        Assessment/Plan    PT Assessment Patient needs continued PT services  PT Diagnosis Generalized weakness;Difficulty walking   PT Problem List Decreased strength;Decreased range of motion;Decreased activity tolerance;Decreased balance;Decreased mobility;Decreased coordination;Cardiopulmonary status limiting activity;Decreased skin integrity  PT Treatment Interventions DME instruction;Gait training;Stair training;Functional mobility training;Therapeutic activities;Therapeutic exercise;Balance training;Neuromuscular re-education;Patient/family education   PT Goals (Current goals can be found in the Care Plan section) Acute Rehab PT Goals Patient Stated Goal: to walk better and farther PT Goal Formulation: With patient/family Time For Goal Achievement: 01/09/15 Potential to Achieve Goals: Good    Frequency Min 2X/week   Barriers to discharge Inaccessible home environment (stairs with no rails)      Co-evaluation               End of Session Equipment Utilized During Treatment: Gait belt Activity Tolerance: Patient tolerated treatment well;Patient limited by fatigue (anxiety) Patient left: in bed;with call bell/phone within reach;with bed alarm set;with family/visitor present Nurse Communication: Mobility status;Other (comment) (discharge plans)         Time: [id number] PT Time Calculation (min) (ACUTE ONLY): 32 min   Charges:   PT Evaluation $Initial PT Evaluation Tier I: 1 Procedure PT Treatments $Gait Training: 8-22 mins   PT G Codes:        Ramond Dial 01-19-2015, 2:57 PM   Mee Hives, PT MS Acute Rehab Dept. Number: [phone] [id number] and [place] [phone]

## 2014-12-26 NOTE — Progress Notes (Signed)
Patient ID: [name], male   DOB: [birth year], 54 y.o.   MRN: [id number] [facility] [facility] [place] NAME: [name]    MR#:  [id number]  DATE OF BIRTH:  [birth year]  SUBJECTIVE:  Feels weak Generalized bodyaches. Anxious Family at bedside    Review of Systems  Constitutional: Negative for fever, chills and weight loss.  HENT: Negative for ear discharge, ear pain and nosebleeds.   Eyes: Negative for blurred vision, pain and discharge.  Respiratory: Positive for cough and shortness of breath. Negative for sputum production, wheezing and stridor.   Cardiovascular: Negative for chest pain, palpitations, orthopnea and PND.  Gastrointestinal: Negative for nausea, vomiting, abdominal pain and diarrhea.  Genitourinary: Negative for urgency and frequency.  Musculoskeletal: Negative for back pain and joint pain.  Neurological: Positive for weakness. Negative for sensory change, speech change and focal weakness.  Psychiatric/Behavioral: Negative for depression and hallucinations. The patient is not nervous/anxious.   All other systems reviewed and are negative.  Tolerating Diet:yes Tolerating PT: pending  DRUG ALLERGIES:  No Known Allergies  VITALS:  Blood pressure 127/82, pulse 70, temperature 98.2 F (36.8 C), temperature source Oral, resp. rate 16, height 6' 1"  (1.854 m), weight 61.689 kg (136 lb), SpO2 95 %.  PHYSICAL EXAMINATION:   Physical Exam  GENERAL:  54 y.o.-year-old patient lying in the bed with no acute distress. Appears chronically ill EYES: Pupils equal, round, reactive to light and accommodation. No scleral icterus. Extraocular muscles intact.  HEENT: Head atraumatic, normocephalic. Oropharynx and nasopharynx clear.  NECK:  Supple, no jugular venous distention. No thyroid enlargement, no tenderness.  LUNGS: Normal breath sounds bilaterally, no wheezing, rales, rhonchi. No use of accessory muscles of respiration.   CARDIOVASCULAR: S1, S2 normal. No murmurs, rubs, or gallops.  ABDOMEN: Soft, nontender, nondistended. Bowel sounds present. No organomegaly or mass.  EXTREMITIES: No cyanosis, clubbing or edema b/l.   Left thigh lesion about 2x3 cm with pus Left 1st digit mild erythema NEUROLOGIC: Cranial nerves II through XII are intact. No focal Motor or sensory deficits b/l.  Weak,frail PSYCHIATRIC: The patient is alert and oriented x 3.  SKIN: No obvious rash, lesion, or ulcer.    LABORATORY PANEL:   CBC  Recent Labs Lab 12/23/14 1145  WBC 5.4  HGB 12.2*  HCT 37.2*  PLT 221    Chemistries   Recent Labs Lab 12/23/14 1145 12/24/14 0410  NA 138 138  K 3.8 4.4  CL 103 106  CO2 27 26  GLUCOSE 123* 147*  BUN 19 19  CREATININE 1.35* 0.94  CALCIUM 8.3* 7.5*  MG 1.6*  --   AST 68* 43*  ALT 30 26  ALKPHOS 39 28*  BILITOT 0.2* <0.1*    Cardiac Enzymes  Recent Labs Lab 12/23/14 1145  TROPONINI 0.03    RADIOLOGY:  No results found. ASSESSMENT AND PLAN:  [name] is a 54 y.o. male with a known history of multiple myeloma who presents to the hospital with complaints of fevers, chills, cough for the past 3 days with green phlegm, wheezing since last night feeling weak and tired  1. Sepsis due to acute bronchitis - continue IV fluids -off Levophed to keep patient's MAP above 65 -pt runs low BP 90-100 -IVF -not on any pressors at present -BC 1/2  GPC in aerobic-coagualse neg staph -d/c vanc and zosyn--->po augmentin for thigh (chronic ) and finger wwound   2. Acute hypoxic respiratory failure due to  COPD exacerbation, -wean oxygen down -improving  3. Left thigh ulcer/esion with chronic pus for 1 month -induration+ -no surgical indication. -cont wound dressing changes  4. Renal insufficiency -resolved  5. Multiple myeloma with chronic pain syndrome, hold Revlimid get oncologist involved for further recommendations -now on po dilaudid and fentanyl patch -chronic  pain issues  6. Metabolic encephalopathy due to hypotension, hyoxia Ct head neg -resolved  7. Tobacco abuse counseling. Discussed with patient's family for 3 minutes. Nicotine replacement therapy will be initiated   D/w pt for d/c in home if cont to improve CM for d/c planning  Case discussed with Care Management/Social Worker. Management plans discussed with the patient, family and they are in agreement.  CODE STATUS: full  DVT Prophylaxis: lovneox  TOTAL criticalTIME TAKING CARE OF THIS PATIENT: 35 minutes.  >50% time spent on counselling and coordination of care  POSSIBLE D/C IN 1DAYS, DEPENDING ON CLINICAL CONDITION.   [name] M.D on 12/26/2014 at 1:03 PM  Between 7am to 6pm - Pager - [phone]  After 6pm go to [email or web address] - password [email or web address] [facility]  Office  [phone]  CC: Primary care physician; Petra Kuba, MD

## 2014-12-27 LAB — WOUND CULTURE: CULTURE: NORMAL

## 2014-12-27 LAB — CREATININE, SERUM
CREATININE: 0.64 mg/dL (ref 0.61–1.24)
GFR calc Af Amer: >60 mL/min (ref >60)

## 2014-12-27 MED ORDER — COLLAGENASE 250 UNIT/GM EX OINT: TOPICAL_OINTMENT | Freq: Every day | CUTANEOUS | Status: DC

## 2014-12-27 MED ORDER — ALPRAZOLAM 0.5 MG PO TABS: 0.5000 mg | ORAL_TABLET | Freq: Three times a day (TID) | ORAL | Status: DC | PRN

## 2014-12-27 MED ORDER — SILVER SULFADIAZINE 1 % EX CREA: TOPICAL_CREAM | Freq: Every day | CUTANEOUS | Status: DC

## 2014-12-27 MED ORDER — AMOXICILLIN-POT CLAVULANATE 875-125 MG PO TABS: 1.0000 | ORAL_TABLET | Freq: Two times a day (BID) | ORAL | Status: DC

## 2014-12-27 MED ORDER — TIOTROPIUM BROMIDE MONOHYDRATE 18 MCG IN CAPS: 18.0000 ug | ORAL_CAPSULE | Freq: Every day | RESPIRATORY_TRACT | Status: DC

## 2014-12-27 NOTE — Care Management Note (Signed)
Case Management Note  Patient Details  Name: [name] MRN: [id number] Date of Birth: Jan 01, 1961  Subjective/Objective:   Discussed discharge planning with Dr Fritzi Mandes. Discussed discharge planning with [name] who chose [facility] to be his home health provider since they already provide his home oxygen. Request for a front wheel rolling walker, and home health RN and PT , were faxed and called to [facility]. [name] ledon will be discharged home with enough wound dressing supplies for several days.                   Action/Plan:   Expected Discharge Date:                  Expected Discharge Plan:  [place]  In-House Referral:     Discharge planning Services  CM Consult  Post Acute Care Choice:  Home Health Choice offered to:  Patient  DME Arranged:    DME Agency:     HH Arranged:    [place] Agency:     Status of Service:  In process, will continue to follow  Medicare Important Message Given:    Date Medicare IM Given:    Medicare IM give by:    Date Additional Medicare IM Given:    Additional Medicare Important Message give by:     If discussed at [place] of Stay Meetings, dates discussed:    Additional Comments:  Cleda Imel A, RN 12/27/2014, 10:44 AM

## 2014-12-27 NOTE — Care Management Note (Signed)
Case Management Note  Patient Details  Name: [name] MRN: [id number] Date of Birth: 15-Sep-1960  Subjective/Objective:     A request for a front wheel rolling walker has been faxed and called to [facility] to be delivered to [name] in his hospital room today.                Action/Plan:   Expected Discharge Date:                  Expected Discharge Plan:  [place]  In-House Referral:     Discharge planning Services  CM Consult  Post Acute Care Choice:  Home Health Choice offered to:  Patient  DME Arranged:    DME Agency:     HH Arranged:    [place] Agency:     Status of Service:  In process, will continue to follow  Medicare Important Message Given:    Date Medicare IM Given:    Medicare IM give by:    Date Additional Medicare IM Given:    Additional Medicare Important Message give by:     If discussed at [place] of Stay Meetings, dates discussed:    Additional Comments:  Satin Boal A, RN 12/27/2014, 11:17 AM

## 2014-12-27 NOTE — Plan of Care (Signed)
Problem: Skin Integrity: Goal: Risk for impaired skin integrity will decrease Outcome: Progressing Pt pain is still not properly controlled but pt was able to rest a little during shift. Pt was given meds for anxiety x2 during shift. Pt had 1 loose stool during shift. No other signs of distress noted. Will continue to monitor.

## 2014-12-27 NOTE — Progress Notes (Signed)
VSS. Dilaudid given for chronic hips and lower back pain with improvement. Xanax given for anxiety with good effect. Dressing changed to L index finger and L trochanter per order. Pt discharged home with PT and RN for teaching dressing change. Pt refused to wait in room for a walker from advanced home care and requested that it will be shipped to his house. Pt is aware that it will take few days and is in agreement. Jeani Hawking the care manager is aware. Discharge instructions given and explained to pt. Prescriptions for xanax and phenergan given. Pt instructed to schedule follow up appointments. Educational handouts about cellulitis and sepsis given and explained to pt. Pt verbalized understanding.

## 2014-12-27 NOTE — Discharge Summary (Signed)
[facility] [place] NAME: [name]    MR#:  [id number]  DATE OF BIRTH:  11/08/1960  DATE OF ADMISSION:  12/23/2014 ADMITTING PHYSICIAN: Theodoro Grist, MD  DATE OF DISCHARGE: 12/27/14  PRIMARY CARE PHYSICIAN: Petra Kuba, MD    ADMISSION DIAGNOSIS:  Unresponsive [R40.4] Cellulitis of second finger of left hand [Y19.509] COPD with exacerbation (HCC) [J44.1] Hypotension, unspecified hypotension type [I95.9]  DISCHARGE DIAGNOSIS:  Cellulitis of left index finger Multiple myeloma Chronic pain syndrome anxiety SECONDARY DIAGNOSIS:   Past Medical History  Diagnosis Date  . Multiple myeloma [facility])     HOSPITAL COURSE:   [name] is a 54 y.o. male with a known history of multiple myeloma who presents to the hospital with complaints of fevers, chills, cough for the past 3 days with green phlegm, wheezing since last night feeling weak and tired  1. Sepsis due to mild acute bronchitis/cellulitis of left index finger Sepsis resolved -pt runs low BP 90-100 --BC 1/2 GPC in aerobic-coagualse neg staph -d/c vanc and zosyn--->po augmentin for thigh (chronic ) and finger wwound  2. Acute hypoxic respiratory failure due to COPD exacerbation, -wean oxygen down -improving  3. Left thigh ulcer/esion with chronic pus for 1 month -induration+ -no surgical indication. -cont wound dressing changes  4. Renal insufficiency -resolved  5. Multiple myeloma with chronic pain syndrome, hold Revlimid get oncologist involved for further recommendations -now on po dilaudid and fentanyl patch -chronic pain issues  6. Metabolic encephalopathy due to hypotension, hyoxia Ct head neg -resolved  7. Tobacco abuse counseling. Discussed with patient's family for 3 minutes. Nicotine replacement therapy will be initiated   PT recommends HHPT Will d/c home  CONSULTS OBTAINED:  Treatment Team:  Cammie Sickle, MD Fritzi Mandes,  MD  DRUG ALLERGIES:  No Known Allergies  DISCHARGE MEDICATIONS:   Current Discharge Medication List    START taking these medications   Details  amoxicillin-clavulanate (AUGMENTIN) 875-125 MG tablet Take 1 tablet by mouth every 12 (twelve) hours. Qty: 12 tablet, Refills: 0    collagenase (SANTYL) ointment Apply topically daily. Qty: 15 g, Refills: 0    silver sulfADIAZINE (SILVADENE) 1 % cream Apply topically daily. Qty: 50 g, Refills: 0    tiotropium (SPIRIVA) 18 MCG inhalation capsule Place 1 capsule (18 mcg total) into inhaler and inhale daily. Qty: 30 capsule, Refills: 12      CONTINUE these medications which have CHANGED   Details  ALPRAZolam (XANAX) 0.5 MG tablet Take 1-2 tablets (0.5-1 mg total) by mouth 3 (three) times daily as needed for anxiety. Qty: 30 tablet, Refills: 0      CONTINUE these medications which have NOT CHANGED   Details  albuterol (PROVENTIL HFA;VENTOLIN HFA) 108 (90 BASE) MCG/ACT inhaler Inhale 2 puffs into the lungs every 6 (six) hours as needed for wheezing or shortness of breath.    amiodarone (PACERONE) 200 MG tablet Take 200 mg by mouth daily.    aspirin 325 MG EC tablet Take 325 mg by mouth daily.    atorvastatin (LIPITOR) 20 MG tablet Take 20 mg by mouth daily.    carvedilol (COREG) 6.25 MG tablet Take 6.25 mg by mouth 2 (two) times daily.     dexamethasone (DECADRON) 4 MG tablet Take 4 mg by mouth once a week. Pt takes on Friday.    docusate sodium (COLACE) 100 MG capsule Take 100 mg by mouth 2 (two) times daily.    fentaNYL (DURAGESIC -  DOSED MCG/HR) 100 MCG/HR Place 100 mcg onto the skin every other day. Pt uses in addition to a 49mg and 255m patch.    fentaNYL (DURAGESIC - DOSED MCG/HR) 12 MCG/HR Place 12 mcg onto the skin every other day. Pt uses in addition to a 2550mand 100m63match.    fentaNYL (DURAGESIC - DOSED MCG/HR) 25 MCG/HR patch Place 25 mcg onto the skin every other day. Pt uses in addition to a 12mc59md 100mcg52mtch.    Fluticasone-Salmeterol (ADVAIR) 100-50 MCG/DOSE AEPB Inhale 1 puff into the lungs 2 (two) times daily.    gabapentin (NEURONTIN) 100 MG capsule Take 1 capsule (100 mg total) by mouth 3 (three) times daily. Qty: 90 capsule, Refills: 3   Associated Diagnoses: Multiple myeloma (HCC)    HYDROmorphone (DILAUDID) 2 MG tablet Take 2-4 mg by mouth every 2 (two) hours as needed for severe pain.    ibuprofen (ADVIL,MOTRIN) 200 MG tablet Take 400 mg by mouth every 6 (six) hours as needed for fever.    lenalidomide (REVLIMID) 25 MG capsule Take 25 mg by mouth daily. Pt takes one capsule daily for 21 days, then 7 days off.    lisinopril (PRINIVIL,ZESTRIL) 2.5 MG tablet Take 2.5 mg by mouth daily.    polyethylene glycol (MIRALAX / GLYCOLAX) packet Take 17 g by mouth daily.    potassium chloride SA (K-DUR,KLOR-CON) 10 MEQ tablet Take 1 tablet (10 mEq total) by mouth daily. Qty: 5 tablet, Refills: 0    promethazine (PHENERGAN) 25 MG tablet Take 1 tablet (25 mg total) by mouth every 4 (four) hours as needed for nausea or vomiting. Qty: 60 tablet, Refills: 2        If you experience worsening of your admission symptoms, develop shortness of breath, life threatening emergency, suicidal or homicidal thoughts you must seek medical attention immediately by calling 911 or calling your MD immediately  if symptoms less severe.  You Must read complete instructions/literature along with all the possible adverse reactions/side effects for all the Medicines you take and that have been prescribed to you. Take any new Medicines after you have completely understood and accept all the possible adverse reactions/side effects.   Please note  You were cared for by a hospitalist during your hospital stay. If you have any questions about your discharge medications or the care you received while you were in the hospital after you are discharged, you can call the unit and asked to speak with the hospitalist on  call if the hospitalist that took care of you is not available. Once you are discharged, your primary care physician will handle any further medical issues. Please note that NO REFILLS for any discharge medications will be authorized once you are discharged, as it is imperative that you return to your primary care physician (or establish a relationship with a primary care physician if you do not have one) for your aftercare needs so that they can reassess your need for medications and monitor your lab values. Today   SUBJECTIVE   Doing ok  VITAL SIGNS:  Blood pressure 144/73, pulse 104, temperature 97.6 F (36.4 C), temperature source Oral, resp. rate 18, height 6' 1"  (1.854 m), weight 136 lb (61.689 kg), SpO2 99 %.  I/O:   Intake/Output Summary (Last 24 hours) at 12/27/14 1020 Last data filed at 12/27/14 0900  Gross per 24 hour  Intake    120 ml  Output      0 ml  Net  120 ml    PHYSICAL EXAMINATION:   GENERAL: 54 y.o.-year-old patient lying in the bed with no acute distress. Appears chronically ill EYES: Pupils equal, round, reactive to light and accommodation. No scleral icterus. Extraocular muscles intact.  HEENT: Head atraumatic, normocephalic. Oropharynx and nasopharynx clear.  NECK: Supple, no jugular venous distention. No thyroid enlargement, no tenderness.  LUNGS: Normal breath sounds bilaterally, no wheezing, rales, rhonchi. No use of accessory muscles of respiration.  CARDIOVASCULAR: S1, S2 normal. No murmurs, rubs, or gallops.  ABDOMEN: Soft, nontender, nondistended. Bowel sounds present. No organomegaly or mass.  EXTREMITIES: No cyanosis, clubbing or edema b/l. Left thigh lesion about 2x3 cm with pus Left 1st digit mild erythema NEUROLOGIC: Cranial nerves II through XII are intact. No focal Motor or sensory deficits b/l. Weak,frail PSYCHIATRIC: The patient is alert and oriented x 3.  SKIN: No obvious rash, lesion, or ulcer.  DATA REVIEW:   CBC    Recent Labs Lab 12/23/14 1145  WBC 5.4  HGB 12.2*  HCT 37.2*  PLT 221    Chemistries   Recent Labs Lab 12/23/14 1145 12/24/14 0410 12/27/14 0458  NA 138 138  --   K 3.8 4.4  --   CL 103 106  --   CO2 27 26  --   GLUCOSE 123* 147*  --   BUN 19 19  --   CREATININE 1.35* 0.94 0.64  CALCIUM 8.3* 7.5*  --   MG 1.6*  --   --   AST 68* 43*  --   ALT 30 26  --   ALKPHOS 39 28*  --   BILITOT 0.2* <0.1*  --     Microbiology Results   Recent Results (from the past 240 hour(s))  Culture, blood (routine x 2)     Status: None (Preliminary result)   Collection Time: 12/23/14 12:40 PM  Result Value Ref Range Status   Specimen Description BLOOD LEFT FATTY CASTS  Final   Special Requests   Final    BOTTLES DRAWN AEROBIC AND ANAEROBIC 3CC AEROBIC,3CCANAEROBIC   Culture NO GROWTH 4 DAYS  Final   Report Status PENDING  Incomplete  Culture, blood (routine x 2)     Status: None (Preliminary result)   Collection Time: 12/23/14 12:40 PM  Result Value Ref Range Status   Specimen Description BLOOD RIGHT FATTY CASTS  Final   Special Requests   Final    BOTTLES DRAWN AEROBIC AND ANAEROBIC 1CC AEROBIC,1CC ANAEROBIC   Culture  Setup Time   Final    GRAM POSITIVE COCCI IN CLUSTERS AEROBIC BOTTLE ONLY CRITICAL RESULT CALLED TO, READ BACK BY AND VERIFIED WITH: BROOKE WEAN AT 5093 ON 12/24/14 CTJ    Culture   Final    COAGULASE NEGATIVE STAPHYLOCOCCUS AEROBIC BOTTLE ONLY Results consistent with contamination.    Report Status PENDING  Incomplete  MRSA PCR Screening     Status: None   Collection Time: 12/23/14  5:57 PM  Result Value Ref Range Status   MRSA by PCR NEGATIVE NEGATIVE Final    Comment:        The GeneXpert MRSA Assay (FDA approved for NASAL specimens only), is one component of a comprehensive MRSA colonization surveillance program. It is not intended to diagnose MRSA infection nor to guide or monitor treatment for MRSA infections.   Urine culture     Status: None    Collection Time: 12/24/14  1:15 AM  Result Value Ref Range Status   Specimen Description URINE, RANDOM  Final   Special Requests NONE  Final   Culture NO GROWTH 2 DAYS  Final   Report Status 12/26/2014 FINAL  Final  Wound culture     Status: None   Collection Time: 12/24/14  1:10 PM  Result Value Ref Range Status   Specimen Description HAND  Final   Special Requests LEFT FINGER  Final   Gram Stain RARE WBC SEEN NO ORGANISMS SEEN   Final   Culture NORMAL SKIN FLORA  Final   Report Status 12/27/2014 FINAL  Final    RADIOLOGY:  No results found.   Management plans discussed with the patient, family and they are in agreement.  CODE STATUS:     Code Status Orders        Start     Ordered   12/23/14 1801  Full code   Continuous     12/23/14 1801       TOTAL TIME TAKING CARE OF THIS PATIENT: 40 minutes.    Henriette Hesser M.D on 12/27/2014 at 10:20 AM  Between 7am to 6pm - Pager - [phone] After 6pm go to [email or web address] - password [email or web address] [facility]  Office  [phone]  CC: Primary care physician; Petra Kuba, MD

## 2014-12-27 NOTE — Discharge Instructions (Signed)
HHPT °

## 2014-12-28 LAB — CULTURE, BLOOD (ROUTINE X 2): Culture: NO GROWTH

## 2014-12-29 ENCOUNTER — Telehealth: Payer: Self-pay | Admitting: *Deleted

## 2014-12-29 LAB — BLOOD GAS, ARTERIAL
ALLENS TEST (PASS/FAIL): POSITIVE — AB
Acid-Base Excess: 0 mmol/L (ref 0.0–3.0)
Bicarbonate: 24.8 mEq/L (ref 21.0–28.0)
FIO2: 0.36
O2 SAT: 98.7 %
PATIENT TEMPERATURE: 37
PO2 ART: 122 mmHg — AB (ref 83.0–108.0)
pCO2 arterial: 40 mmHg (ref 32.0–48.0)
pH, Arterial: 7.4 (ref 7.350–7.450)

## 2014-12-29 LAB — CULTURE, BLOOD (ROUTINE X 2)

## 2014-12-29 MED ORDER — HYDROMORPHONE HCL 2 MG PO TABS: 2.0000 mg | ORAL_TABLET | ORAL | Status: DC | PRN

## 2014-12-29 NOTE — Telephone Encounter (Signed)
Informed that prescription is ready to pick up  

## 2014-12-30 ENCOUNTER — Telehealth: Payer: Self-pay | Admitting: *Deleted

## 2014-12-30 NOTE — Telephone Encounter (Signed)
[facility] is inquiring when patient will restart revlimid. He was in the [facility] last week.  The patient has an appointment with Dr. Tish Men next week.

## 2015-01-01 ENCOUNTER — Encounter: Payer: Medicaid Other | Attending: Surgery | Admitting: Surgery

## 2015-01-01 ENCOUNTER — Other Ambulatory Visit: Payer: Self-pay | Admitting: *Deleted

## 2015-01-01 ENCOUNTER — Telehealth: Payer: Self-pay | Admitting: *Deleted

## 2015-01-01 DIAGNOSIS — L89223 Pressure ulcer of left hip, stage 3: Secondary | ICD-10-CM | POA: Diagnosis present

## 2015-01-01 DIAGNOSIS — G629 Polyneuropathy, unspecified: Secondary | ICD-10-CM | POA: Diagnosis not present

## 2015-01-01 DIAGNOSIS — X58XXXA Exposure to other specified factors, initial encounter: Secondary | ICD-10-CM | POA: Insufficient documentation

## 2015-01-01 DIAGNOSIS — J449 Chronic obstructive pulmonary disease, unspecified: Secondary | ICD-10-CM | POA: Diagnosis not present

## 2015-01-01 DIAGNOSIS — C9000 Multiple myeloma not having achieved remission: Secondary | ICD-10-CM | POA: Insufficient documentation

## 2015-01-01 DIAGNOSIS — R600 Localized edema: Secondary | ICD-10-CM | POA: Diagnosis not present

## 2015-01-01 DIAGNOSIS — F419 Anxiety disorder, unspecified: Secondary | ICD-10-CM | POA: Diagnosis not present

## 2015-01-01 DIAGNOSIS — S61210A Laceration without foreign body of right index finger without damage to nail, initial encounter: Secondary | ICD-10-CM | POA: Diagnosis not present

## 2015-01-01 DIAGNOSIS — F17218 Nicotine dependence, cigarettes, with other nicotine-induced disorders: Secondary | ICD-10-CM | POA: Insufficient documentation

## 2015-01-01 DIAGNOSIS — F1910 Other psychoactive substance abuse, uncomplicated: Secondary | ICD-10-CM | POA: Insufficient documentation

## 2015-01-01 DIAGNOSIS — I2510 Atherosclerotic heart disease of native coronary artery without angina pectoris: Secondary | ICD-10-CM | POA: Insufficient documentation

## 2015-01-01 DIAGNOSIS — E441 Mild protein-calorie malnutrition: Secondary | ICD-10-CM | POA: Diagnosis not present

## 2015-01-01 NOTE — Telephone Encounter (Signed)
Called [facility] back. [name] is still holding his revlimid due to a recent infection. He will be reevaluated on Wednesday to see if it's safe to resume the Revlimid. If so, a new RX will need to be faxed to [facility].

## 2015-01-02 NOTE — Progress Notes (Addendum)
BRALON, ANTKOWIAK ([id number]) Visit Report for 01/01/2015 Chief Complaint Document Details Patient Name: [name], [name] Date of Service: 01/01/2015 9:45 AM Medical Record Number: [id number] Patient Account Number: [id number] Date of Birth/Sex: [birth year] (54 y.o. Male) Treating RN: Cornell Barman Primary Care Physician: Sena Hitch Other Clinician: Referring Physician: Sena Hitch Treating Physician/Extender: Frann Rider in Treatment: 8 Information Obtained from: Patient Chief Complaint Patient is at the clinic for treatment of an open pressure ulcer. 54 year old gentleman with a ulcerated area on his left hip she's had for 3 weeks. Electronic Signature(s) Signed: 01/01/2015 10:14:17 AM By: Christin Fudge MD, FACS Entered By: Christin Fudge on 01/01/2015 10:14:17 Isaacks, [name] ([id number]) -------------------------------------------------------------------------------- Debridement Details Patient Name: [name] Date of Service: 01/01/2015 9:45 AM Medical Record Number: [id number] Patient Account Number: [id number] Date of Birth/Sex: 31-May-1960 (54 y.o. Male) Treating RN: Cornell Barman Primary Care Physician: Sena Hitch Other Clinician: Referring Physician: Sena Hitch Treating Physician/Extender: Frann Rider in Treatment: 8 Debridement Performed for Wound #1 Left Ischial Tuberosity Assessment: Performed By: Physician Christin Fudge, MD Debridement: Debridement Pre-procedure Yes Verification/Time Out Taken: Start Time: 10:05 Pain Control: Other : lidocaine 4% Level: Skin/Subcutaneous Tissue Total Area Debrided (L x 1 (cm) x 1 (cm) = 1 (cm) W): Tissue and other Viable, Non-Viable, Eschar, Fibrin/Slough, Skin, Subcutaneous material debrided: Instrument: Curette Bleeding: None End Time: 10:07 Procedural Pain: 0 Post Procedural Pain: 0 Response to Treatment: Procedure was tolerated well Post Debridement Measurements of Total  Wound Length: (cm) 1 Width: (cm) 1 Depth: (cm) 0.1 Volume: (cm) 0.079 Post Procedure Diagnosis Same as Pre-procedure Electronic Signature(s) Signed: 01/01/2015 10:13:54 AM By: Christin Fudge MD, FACS Signed: 01/01/2015 3:49:12 PM By: Gretta Cool, RN, BSN, Kim RN, BSN Entered By: Christin Fudge on 01/01/2015 10:13:53 Kretsch, [name] ([id number]) -------------------------------------------------------------------------------- Debridement Details Patient Name: [name] Date of Service: 01/01/2015 9:45 AM Medical Record Number: [id number] Patient Account Number: [id number] Date of Birth/Sex: 05/14/1960 (54 y.o. Male) Treating RN: Cornell Barman Primary Care Physician: Sena Hitch Other Clinician: Referring Physician: Sena Hitch Treating Physician/Extender: Frann Rider in Treatment: 8 Debridement Performed for Wound #2 Left,Circumferential Hand - 2nd Digit Assessment: Performed By: Physician Christin Fudge, MD Debridement: Debridement Pre-procedure Yes Verification/Time Out Taken: Start Time: 10:05 Pain Control: Other : lidocaine 4% Level: Skin/Subcutaneous Tissue Total Area Debrided (L x 5 (cm) x 8.5 (cm) = 42.5 (cm) W): Tissue and other Viable, Non-Viable, Eschar, Fibrin/Slough, Skin, Subcutaneous material debrided: Instrument: Other : gauze Bleeding: None End Time: 10:09 Procedural Pain: 0 Post Procedural Pain: 0 Response to Treatment: Procedure was tolerated well Post Debridement Measurements of Total Wound Length: (cm) 5 Width: (cm) 5 Depth: (cm) 0.1 Volume: (cm) 1.963 Post Procedure Diagnosis Same as Pre-procedure Electronic Signature(s) Signed: 01/01/2015 10:14:09 AM By: Christin Fudge MD, FACS Signed: 01/01/2015 3:49:12 PM By: Gretta Cool RN, BSN, Kim RN, BSN Entered By: Christin Fudge on 01/01/2015 10:14:09 Deckman, [name] ([id number]) -------------------------------------------------------------------------------- HPI Details Patient Name: [name] Date of Service: 01/01/2015 9:45 AM Medical Record Number: [id number] Patient Account Number: [id number] Date of Birth/Sex: [birth year] (54 y.o. Male) Treating RN: Cornell Barman Primary Care Physician: Sena Hitch Other Clinician: Referring Physician: Sena Hitch Treating Physician/Extender: Frann Rider in Treatment: 8 History of Present Illness Location: left hip area Quality: Patient reports experiencing a dull pain to affected area(s). Severity: Patient states wound are getting worse. Duration: Patient has had the wound for < 3 weeks prior to presenting for treatment Timing: Pain in wound is Intermittent (comes and  goes Context: The wound appeared gradually over time Modifying Factors: Other treatment(s) tried include: local dressing changes with antibiotic cream Associated Signs and Symptoms: Patient reports having difficulty standing for long periods. HPI Description: The patient is under treatment with medical oncology Dr. Ma Hillock sees him, for multiple myeloma, anemia, COPD, peripheral neuropathy, pedal edema and has been referred to [place] for left lateral hip pressure ulcer. his past medical history significant for coronary artery disease, dilated cardiomyopathy with an EF of 20% and has had her AICD placement, kidney stones, asthma, COPD, polysubstance abuse, anxiety. continues to smoke a pack of cigarettes a day, history of recreational drug usage and used to take IV drugs in the late 1980s. He was in bed for a long while due to an associated illness and lay on his left side for prolonged periods of time. He has been ambulatory otherwise and has a fairly poor nutrition. 01/01/2015 -- I have reviewed the notes whether the patient was recently admitted to the hospital between November 30 and 12/27/2014. He was admitted for cellulitis of the left second finger, COPD exacerbation and hypotension and was found that no surgical debridement was required after a  surgical consult by Dr. Adonis Huguenin. he was treated with injectable vancomycin and Zosyn initially and then sent home on oral Augmentin. Electronic Signature(s) Signed: 01/01/2015 10:14:25 AM By: Christin Fudge MD, FACS Previous Signature: 01/01/2015 9:56:19 AM Version By: Christin Fudge MD, FACS Entered By: Christin Fudge on 01/01/2015 10:14:25 [name] ([id number]) -------------------------------------------------------------------------------- Physical Exam Details Patient Name: [name] Date of Service: 01/01/2015 9:45 AM Medical Record Number: [id number] Patient Account Number: [id number] Date of Birth/Sex: 15-Sep-1960 (54 y.o. Male) Treating RN: Cornell Barman Primary Care Physician: Sena Hitch Other Clinician: Referring Physician: Sena Hitch Treating Physician/Extender: Frann Rider in Treatment: 8 Constitutional . Pulse regular. Respirations normal and unlabored. Afebrile. . Eyes Nonicteric. Reactive to light. Ears, Nose, Mouth, and Throat Lips, teeth, and gums WNL.Marland Kitchen Moist mucosa without lesions . Neck supple and nontender. No palpable supraclavicular or cervical adenopathy. Normal sized without goiter. Respiratory WNL. No retractions.. Cardiovascular Pedal Pulses WNL. No clubbing, cyanosis or edema. Lymphatic No adneopathy. No adenopathy. No adenopathy. Musculoskeletal Adexa without tenderness or enlargement.. Digits and nails w/o clubbing, cyanosis, infection, petechiae, ischemia, or inflammatory conditions.. Integumentary (Hair, Skin) No suspicious lesions. No crepitus or fluctuance. No peri-wound warmth or erythema. No masses.Marland Kitchen Psychiatric Judgement and insight Intact.. No evidence of depression, anxiety, or agitation.. Notes though there is some granulation tissue on his left hip sharp debridement had to be done and subcutaneous tissue and debris was removed with a curette. The right index finger has had blisters, cellulitis and inflammation  and I had to sharply debride some skin and subcutaneous tissue and this was done by the abrasive technique of saline gauze Electronic Signature(s) Signed: 01/01/2015 10:50:21 AM By: Christin Fudge MD, FACS Entered By: Christin Fudge on 01/01/2015 10:50:21 Lutes, [name] ([id number]) -------------------------------------------------------------------------------- Physician Orders Details Patient Name: [name] Date of Service: 01/01/2015 9:45 AM Medical Record Number: [id number] Patient Account Number: [id number] Date of Birth/Sex: 1960/07/04 (54 y.o. Male) Treating RN: Cornell Barman Primary Care Physician: Sena Hitch Other Clinician: Referring Physician: Sena Hitch Treating Physician/Extender: Frann Rider in Treatment: 8 Verbal / Phone Orders: Yes Clinician: Cornell Barman Read Back and Verified: Yes Diagnosis Coding Wound Cleansing Wound #1 Left Ischial Tuberosity o Clean wound with Normal Saline. Wound #2 Left,Circumferential Hand - 2nd Digit o Clean wound with Normal Saline. Anesthetic Wound #1 Left  Ischial Tuberosity o Topical Lidocaine 4% cream applied to wound bed prior to debridement Wound #2 Left,Circumferential Hand - 2nd Digit o Topical Lidocaine 4% cream applied to wound bed prior to debridement Skin Barriers/Peri-Wound Care Wound #1 Left Ischial Tuberosity o Barrier cream Primary Wound Dressing Wound #1 Left Ischial Tuberosity o Santyl Ointment Wound #2 Left,Circumferential Hand - 2nd Digit o Other: - sorbact with hydrogel Secondary Dressing Wound #1 Left Ischial Tuberosity o Boardered Foam Dressing Wound #2 Left,Circumferential Hand - 2nd Digit o Conform/Kerlix - stretch net Dressing Change Frequency Wound #1 Left Ischial Tuberosity o Change dressing every day. COLESTON, DIROSA A. ([id number]) Follow-up Appointments Wound #1 Left Ischial Tuberosity o Return Appointment in 1 week. Wound #2 Left,Circumferential Hand -  2nd Digit o Return Appointment in 1 week. Off-Loading Wound #1 Left Ischial Tuberosity o Turn and reposition every 2 hours Additional Orders / Instructions Wound #1 Left Ischial Tuberosity o Stop Smoking o Increase protein intake. o Other: - obtain vitamins;zinc, vitamin C Wound #2 Left,Circumferential Hand - 2nd Digit o Stop Smoking o Increase protein intake. o Other: - obtain vitamins;zinc, vitamin C Home Health Wound #1 Left Ischial [place] Visits - [place] Nurse may visit PRN to address patientos wound care needs. o FACE TO FACE ENCOUNTER: MEDICARE and MEDICAID PATIENTS: I certify that this patient is under my care and that I had a face-to-face encounter that meets the physician face-to-face encounter requirements with this patient on this date. The encounter with the patient was in whole or in part for the following MEDICAL CONDITION: (primary reason for [place]) MEDICAL NECESSITY: I certify, that based on my findings, NURSING services are a medically necessary home health service. HOME BOUND STATUS: I certify that my clinical findings support that this patient is homebound (i.e., Due to illness or injury, pt requires aid of supportive devices such as crutches, cane, wheelchairs, walkers, the use of special transportation or the assistance of another person to leave their place of residence. There is a normal inability to leave the home and doing so requires considerable and taxing effort. Other absences are for medical reasons / religious services and are infrequent or of short duration when for other reasons). o If current dressing causes regression in wound condition, may D/C ordered dressing product/s and apply Normal Saline Moist Dressing [name] until next [place] / Other MD appointment. [place] of regression in wound condition at [phone]. o Please direct any  NON-WOUND related issues/requests for orders to patient's Primary Care Physician Wound #2 Left,Circumferential Hand - 2nd Digit o [place] Visits - [place] Nurse may visit PRN to address patientos wound care needs. REDMOND, WHITTLEY ([id number]) o FACE TO FACE ENCOUNTER: MEDICARE and MEDICAID PATIENTS: I certify that this patient is under my care and that I had a face-to-face encounter that meets the physician face-to-face encounter requirements with this patient on this date. The encounter with the patient was in whole or in part for the following MEDICAL CONDITION: (primary reason for [place]) MEDICAL NECESSITY: I certify, that based on my findings, NURSING services are a medically necessary home health service. HOME BOUND STATUS: I certify that my clinical findings support that this patient is homebound (i.e., Due to illness or injury, pt requires aid of supportive devices such as crutches, cane, wheelchairs, walkers, the use of special transportation or the assistance of another person to leave their place of residence. There is a  normal inability to leave the home and doing so requires considerable and taxing effort. Other absences are for medical reasons / religious services and are infrequent or of short duration when for other reasons). o If current dressing causes regression in wound condition, may D/C ordered dressing product/s and apply Normal Saline Moist Dressing [name] until next [place] / Other MD appointment. [place] of regression in wound condition at [phone]. o Please direct any NON-WOUND related issues/requests for orders to patient's Primary Care Physician Oxygen Administration Wound #1 Left Ischial Tuberosity o Pulse Oxymetry measurement in clinic - 98 o While patient is in clinic, provide supplimental oxygen via nasal cannula at liters/min __ : - 3L Wound #2 Left,Circumferential Hand -  2nd Digit o Pulse Oxymetry measurement in clinic - 98 o While patient is in clinic, provide supplimental oxygen via nasal cannula at liters/min __ : - 3L Electronic Signature(s) Signed: 01/01/2015 3:49:12 PM By: Gretta Cool, RN, BSN, Kim RN, BSN Signed: 01/01/2015 4:13:31 PM By: Christin Fudge MD, FACS Entered By: Gretta Cool, RN, BSN, Kim on 01/01/2015 10:11:45 Dietze, [name] ([id number]) -------------------------------------------------------------------------------- Problem List Details Patient Name: [name] Date of Service: 01/01/2015 9:45 AM Medical Record Number: [id number] Patient Account Number: [id number] Date of Birth/Sex: Jun 13, 1960 (54 y.o. Male) Treating RN: Cornell Barman Primary Care Physician: Sena Hitch Other Clinician: Referring Physician: Sena Hitch Treating Physician/Extender: Frann Rider in Treatment: 8 Active Problems ICD-10 Encounter Code Description Active Date Diagnosis L89.223 Pressure ulcer of left hip, stage 3 11/05/2014 Yes C90.00 Multiple myeloma not having achieved remission 11/05/2014 Yes F17.218 Nicotine dependence, cigarettes, with other nicotine- 11/05/2014 Yes induced disorders E44.1 Mild protein-calorie malnutrition 11/05/2014 Yes S61.210A Laceration without foreign body of right index finger 01/01/2015 Yes without damage to nail, initial encounter Inactive Problems Resolved Problems Electronic Signature(s) Signed: 01/01/2015 10:13:41 AM By: Christin Fudge MD, FACS Entered By: Christin Fudge on 01/01/2015 10:13:41 Drewes, [name] ([id number]) -------------------------------------------------------------------------------- Progress Note Details Patient Name: [name] Date of Service: 01/01/2015 9:45 AM Medical Record Number: [id number] Patient Account Number: [id number] Date of Birth/Sex: October 15, 1960 (54 y.o. Male) Treating RN: Cornell Barman Primary Care Physician: Sena Hitch Other Clinician: Referring Physician:  Sena Hitch Treating Physician/Extender: Frann Rider in Treatment: 8 Subjective Chief Complaint Information obtained from Patient Patient is at the clinic for treatment of an open pressure ulcer. 54 year old gentleman with a ulcerated area on his left hip she's had for 3 weeks. History of Present Illness (HPI) The following HPI elements were documented for the patient's wound: Location: left hip area Quality: Patient reports experiencing a dull pain to affected area(s). Severity: Patient states wound are getting worse. Duration: Patient has had the wound for < 3 weeks prior to presenting for treatment Timing: Pain in wound is Intermittent (comes and goes Context: The wound appeared gradually over time Modifying Factors: Other treatment(s) tried include: local dressing changes with antibiotic cream Associated Signs and Symptoms: Patient reports having difficulty standing for long periods. The patient is under treatment with medical oncology Dr. Ma Hillock sees him, for multiple myeloma, anemia, COPD, peripheral neuropathy, pedal edema and has been referred to [place] for left lateral hip pressure ulcer. his past medical history significant for coronary artery disease, dilated cardiomyopathy with an EF of 20% and has had her AICD placement, kidney stones, asthma, COPD, polysubstance abuse, anxiety. continues to smoke a pack of cigarettes a day, history of recreational drug usage and used to take IV drugs in the late 1980s. He was in  bed for a long while due to an associated illness and lay on his left side for prolonged periods of time. He has been ambulatory otherwise and has a fairly poor nutrition. 01/01/2015 -- I have reviewed the notes whether the patient was recently admitted to the hospital between November 30 and 12/27/2014. He was admitted for cellulitis of the left second finger, COPD exacerbation and hypotension and was found that no surgical debridement was required after a  surgical consult by Dr. Adonis Huguenin. he was treated with injectable vancomycin and Zosyn initially and then sent home on oral Augmentin. Objective Mccree, Caiden A. ([id number]) Constitutional Pulse regular. Respirations normal and unlabored. Afebrile. Vitals Time Taken: 9:51 AM, Height: 73 in, Weight: 132 lbs, BMI: 17.4, Temperature: 97.7 F, Pulse: 77 bpm, Respiratory Rate: 20 breaths/min, Blood Pressure: 128/76 mmHg, Pulse Oximetry: 98 %. General Notes: Patient placed on 3L of O2 while in clinic. Eyes Nonicteric. Reactive to light. Ears, Nose, Mouth, and Throat Lips, teeth, and gums WNL.Marland Kitchen Moist mucosa without lesions . Neck supple and nontender. No palpable supraclavicular or cervical adenopathy. Normal sized without goiter. Respiratory WNL. No retractions.. Cardiovascular Pedal Pulses WNL. No clubbing, cyanosis or edema. Lymphatic No adneopathy. No adenopathy. No adenopathy. Musculoskeletal Adexa without tenderness or enlargement.. Digits and nails w/o clubbing, cyanosis, infection, petechiae, ischemia, or inflammatory conditions.Marland Kitchen Psychiatric Judgement and insight Intact.. No evidence of depression, anxiety, or agitation.. General Notes: though there is some granulation tissue on his left hip sharp debridement had to be done and subcutaneous tissue and debris was removed with a curette. The right index finger has had blisters, cellulitis and inflammation and I had to sharply debride some skin and subcutaneous tissue and this was done by the abrasive technique of saline gauze Integumentary (Hair, Skin) No suspicious lesions. No crepitus or fluctuance. No peri-wound warmth or erythema. No masses.. Wound #1 status is Open. Original cause of wound was Gradually Appeared. The wound is located on the Left Ischial Tuberosity. The wound measures 1cm length x 1cm width x 0.1cm depth; 0.785cm^2 area and 0.079cm^3 volume. The wound is limited to skin breakdown. There is a small amount of  serosanguineous drainage noted. The wound margin is distinct with the outline attached to the wound base. There is small (1-33%) pink granulation within the wound bed. There is a large (67-100%) amount of necrotic tissue within the wound bed including Adherent Slough. The periwound skin appearance exhibited: Moist, Hemosiderin Staining, Erythema. The periwound skin appearance did not exhibit: Callus, Crepitus, Excoriation, Fluctuance, Friable, Induration, Localized Edema, Rash, Scarring, Dry/Scaly, Maceration, Atrophie Blanche, Depasquale, Kipper A. ([id number]) Cyanosis, Ecchymosis, Mottled, Pallor, Rubor. The surrounding wound skin color is noted with erythema which is circumferential. Erythema is measured at 7 cm. Periwound temperature was noted as No Abnormality. The periwound has tenderness on palpation. Wound #2 status is Open. Original cause of wound was Gradually Appeared. The wound is located on the Left,Circumferential Hand - 2nd Digit. The wound measures 5cm length x 8.5cm width x 0.1cm depth; 33.379cm^2 area and 3.338cm^3 volume. The wound is limited to skin breakdown. There is no tunneling or undermining noted. There is a medium amount of serous drainage noted. The wound margin is flat and intact. There is small (1-33%) pale granulation within the wound bed. There is a large (67-100%) amount of necrotic tissue within the wound bed including Adherent Slough. The periwound skin appearance exhibited: Maceration, Moist. The periwound skin appearance did not exhibit: Callus, Crepitus, Excoriation, Fluctuance, Friable, Induration, Localized Edema, Rash, Scarring, Dry/Scaly, Atrophie  Blanche, Cyanosis, Ecchymosis, Hemosiderin Staining, Mottled, Pallor, Rubor, Erythema. Assessment Active Problems ICD-10 L89.223 - Pressure ulcer of left hip, stage 3 C90.00 - Multiple myeloma not having achieved remission F17.218 - Nicotine dependence, cigarettes, with other nicotine-induced disorders E44.1 -  Mild protein-calorie malnutrition S61.210A - Laceration without foreign body of right index finger without damage to nail, initial encounter After thoroughly reevaluating him I have recommended: 1. Sorbact to be wrapped around his finger, and dressed with a Kerlix bandage 2. Santyl to be continued to his left hip. 3. Strongly advised him to completely give up smoking as he is dyspneic, on oxygen and continues to smoke about half pack of cigarettes a day 4. See his PCP and his oncologist regarding his medications for CHF, and nicotine addiction. 5. Follow up with [place] next week if all is well. Procedures Wound #1 Wound #1 is a Malignant Wound located on the Left Ischial Tuberosity . There was a Skin/Subcutaneous Tissue Debridement ([id number]) debridement with total area of 1 sq cm performed by Christin Fudge, MD. with the following instrument(s): Curette to remove Viable and Non-Viable tissue/material including Fibrin/Slough, Eschar, Skin, and Subcutaneous after achieving pain control using Other (lidocaine 4%). A time out was conducted prior to the start of the procedure. There was no bleeding. The procedure was Hawn, Olin A. ([id number]) tolerated well with a pain level of 0 throughout and a pain level of 0 following the procedure. Post Debridement Measurements: 1cm length x 1cm width x 0.1cm depth; 0.079cm^3 volume. Post procedure Diagnosis Wound #1: Same as Pre-Procedure Wound #2 Wound #2 is a Cellulitis located on the Left,Circumferential Hand - 2nd Digit . There was a Skin/Subcutaneous Tissue Debridement ([id number]) debridement with total area of 42.5 sq cm performed by Christin Fudge, MD. with the following instrument(s): gauze to remove Viable and Non-Viable tissue/material including Fibrin/Slough, Eschar, Skin, and Subcutaneous after achieving pain control using Other (lidocaine 4%). A time out was conducted prior to the start of the procedure. There was no bleeding. The procedure  was tolerated well with a pain level of 0 throughout and a pain level of 0 following the procedure. Post Debridement Measurements: 5cm length x 5cm width x 0.1cm depth; 1.963cm^3 volume. Post procedure Diagnosis Wound #2: Same as Pre-Procedure Plan Wound Cleansing: Wound #1 Left Ischial Tuberosity: Clean wound with Normal Saline. Wound #2 Left,Circumferential Hand - 2nd Digit: Clean wound with Normal Saline. Anesthetic: Wound #1 Left Ischial Tuberosity: Topical Lidocaine 4% cream applied to wound bed prior to debridement Wound #2 Left,Circumferential Hand - 2nd Digit: Topical Lidocaine 4% cream applied to wound bed prior to debridement Skin Barriers/Peri-Wound Care: Wound #1 Left Ischial Tuberosity: Barrier cream Primary Wound Dressing: Wound #1 Left Ischial Tuberosity: Santyl Ointment Wound #2 Left,Circumferential Hand - 2nd Digit: Other: - sorbact with hydrogel Secondary Dressing: Wound #1 Left Ischial Tuberosity: Boardered Foam Dressing Wound #2 Left,Circumferential Hand - 2nd Digit: Conform/Kerlix - stretch net Dressing Change Frequency: Wound #1 Left Ischial Tuberosity: Change dressing every day. Follow-up Appointments: Wound #1 Left Ischial Tuberosity: Aristizabal, Cross A. ([id number]) Return Appointment in 1 week. Wound #2 Left,Circumferential Hand - 2nd Digit: Return Appointment in 1 week. Off-Loading: Wound #1 Left Ischial Tuberosity: Turn and reposition every 2 hours Additional Orders / Instructions: Wound #1 Left Ischial Tuberosity: Stop Smoking Increase protein intake. Other: - obtain vitamins;zinc, vitamin C Wound #2 Left,Circumferential Hand - 2nd Digit: Stop Smoking Increase protein intake. Other: - obtain vitamins;zinc, vitamin C Home Health: Wound #1 Left Ischial Tuberosity: Continue Home Health Visits -  [place] Nurse may visit PRN to address patient s wound care needs. FACE TO FACE ENCOUNTER: MEDICARE and MEDICAID PATIENTS: I certify that  this patient is under my care and that I had a face-to-face encounter that meets the physician face-to-face encounter requirements with this patient on this date. The encounter with the patient was in whole or in part for the following MEDICAL CONDITION: (primary reason for [place]) MEDICAL NECESSITY: I certify, that based on my findings, NURSING services are a medically necessary home health service. HOME BOUND STATUS: I certify that my clinical findings support that this patient is homebound (i.e., Due to illness or injury, pt requires aid of supportive devices such as crutches, cane, wheelchairs, walkers, the use of special transportation or the assistance of another person to leave their place of residence. There is a normal inability to leave the home and doing so requires considerable and taxing effort. Other absences are for medical reasons / religious services and are infrequent or of short duration when for other reasons). If current dressing causes regression in wound condition, may D/C ordered dressing product/s and apply Normal Saline Moist Dressing [name] until next [place] / Other MD appointment. [place] of regression in wound condition at [phone]. Please direct any NON-WOUND related issues/requests for orders to patient's Primary Care Physician Wound #2 Left,Circumferential Hand - 2nd Digit: [place] Nurse may visit PRN to address patient s wound care needs. FACE TO FACE ENCOUNTER: MEDICARE and MEDICAID PATIENTS: I certify that this patient is under my care and that I had a face-to-face encounter that meets the physician face-to-face encounter requirements with this patient on this date. The encounter with the patient was in whole or in part for the following MEDICAL CONDITION: (primary reason for [place]) MEDICAL NECESSITY: I certify, that based on my findings, NURSING services are a  medically necessary home health service. HOME BOUND STATUS: I certify that my clinical findings support that this patient is homebound (i.e., Due to illness or injury, pt requires aid of supportive devices such as crutches, cane, wheelchairs, walkers, the use of special transportation or the assistance of another person to leave their place of residence. There is a normal inability to leave the home and doing so requires considerable and taxing effort. Other absences are for medical reasons / religious services and are infrequent or of short duration when for other reasons). If current dressing causes regression in wound condition, may D/C ordered dressing product/s and apply Normal Saline Moist Dressing [name] until next [place] / Other MD appointment. Newman of regression in wound condition at [phone]. Please direct any NON-WOUND related issues/requests for orders to patient's Primary Care Physician Oxygen Administration: ABDULAZIZ, TOMAN ([id number]) Wound #1 Left Ischial Tuberosity: Pulse Oxymetry measurement in clinic - 98 While patient is in clinic, provide supplimental oxygen via nasal cannula at liters/min __ : - 3L Wound #2 Left,Circumferential Hand - 2nd Digit: Pulse Oxymetry measurement in clinic - 98 While patient is in clinic, provide supplimental oxygen via nasal cannula at liters/min __ : - 3L After thoroughly reevaluating him I have recommended: 1. Sorbact to be wrapped around his finger, and dressed with a Kerlix bandage 2. Santyl to be continued to his left hip. 3. Strongly advised him to completely give up smoking as he is dyspneic, on oxygen and continues to smoke about half pack of cigarettes a day 4. See his PCP  and his oncologist regarding his medications for CHF, and nicotine addiction. 5. Follow up with [place] next week if all is well. Electronic Signature(s) Signed: 01/01/2015 10:52:01 AM By: Christin Fudge MD, FACS Entered By:  Christin Fudge on 01/01/2015 10:52:01 Koval, [name] ([id number]) -------------------------------------------------------------------------------- SuperBill Details Patient Name: [name] Date of Service: 01/01/2015 Medical Record Number: [id number] Patient Account Number: [id number] Date of Birth/Sex: 1960-09-13 (54 y.o. Male) Treating RN: Cornell Barman Primary Care Physician: Sena Hitch Other Clinician: Referring Physician: Sena Hitch Treating Physician/Extender: Frann Rider in Treatment: 8 Diagnosis Coding ICD-10 Codes Code Description [phone] Pressure ulcer of left hip, stage 3 C90.00 Multiple myeloma not having achieved remission F17.218 Nicotine dependence, cigarettes, with other nicotine-induced disorders E44.1 Mild protein-calorie malnutrition Laceration without foreign body of right index finger without damage to nail, initial S61.210A encounter Facility Procedures CPT4: Description Modifier Quantity Code [id number] 11042 - DEB SUBQ TISSUE 20 SQ CM/< 1 ICD-10 Description Diagnosis L89.223 Pressure ulcer of left hip, stage 3 C90.00 Multiple myeloma not having achieved remission E44.1 Mild protein-calorie malnutrition  S61.210A Laceration without foreign body of right index finger without damage to nail, initial encounter CPT4: [id number] 11045 - DEB SUBQ TISS EA ADDL 20CM 2 ICD-10 Description Diagnosis S61.210A Laceration without foreign body of right index finger without damage to nail, initial encounter L89.223 Pressure ulcer of left hip, stage 3 C90.00 Multiple myeloma  not having achieved remission E44.1 Mild protein-calorie malnutrition Physician Procedures CPT4: Description Modifier Quantity Code [id number] 33354 - WC PHYS SUBQ TISS 20 SQ CM 1 Description PADDY, NEIS ([id number]) Electronic Signature(s) Signed: 01/01/2015 3:49:12 PM By: Gretta Cool, RN, BSN, Kim RN, BSN Signed: 01/01/2015 4:13:31 PM By: Christin Fudge MD, FACS Previous Signature: 01/01/2015  10:52:36 AM Version By: Christin Fudge MD, FACS Entered By: Gretta Cool RN, BSN, Kim on 01/01/2015 15:25:02

## 2015-01-02 NOTE — Progress Notes (Signed)
[name] Visit Report for 01/01/2015 Arrival Information Details Patient Name: [name], [name] Date of Service: 01/01/2015 9:45 AM Medical Record Number: [id number] Patient Account Number: [id number] Date of Birth/Sex: Aug 29, 1960 (54 y.o. Male) Treating RN: [name] Primary Care Physician: [name] Other Clinician: Referring Physician: Sena [name] Treating Physician/Extender: [name] in Treatment: 8 Visit Information History Since Last Visit Added or deleted any medications: No Patient Arrived: Ambulatory Any new allergies or adverse reactions: No Arrival Time: 09:48 Had a fall or experienced change in No Accompanied By: wife activities of daily living that may affect Transfer Assistance: None risk of falls: Patient Identification Verified: Yes Signs or symptoms of abuse/neglect since last No Secondary Verification Process Yes visito Completed: Hospitalized since last visit: Yes Patient Requires Transmission-Based No Has Dressing in Place as Prescribed: Yes Precautions: Pain Present Now: No Patient Has Alerts: No Electronic Signature(s) Signed: 01/01/2015 3:49:12 PM By: [name] Cool, RN, BSN, Kim RN, BSN Entered By: [name] Cool, RN, BSN, [name] on 01/01/2015 09:49:24 [name] -------------------------------------------------------------------------------- Clinic Level of Care Assessment Details Patient Name: [name] Date of Service: 01/01/2015 9:45 AM Medical Record Number: [id number] Patient Account Number: [id number] Date of Birth/Sex: Sep 02, 1960 (54 y.o. Male) Treating RN: [name] Primary Care Physician: [name] Other Clinician: Referring Physician: Sena [name] Treating Physician/Extender: [name] in Treatment: 8 Clinic Level of Care Assessment Items TOOL 3 Quantity Score []  - Use when EandM and Procedure is performed on FOLLOW-UP visit 0 ASSESSMENTS - Nursing Assessment /  Reassessment []  - Reassessment of Co-morbidities (includes updates in patient status) 0 X - Reassessment of Adherence to Treatment Plan 1 5 ASSESSMENTS - Wound and Skin Assessment / Reassessment []  - Points for Wound Assessment can only be taken for a new wound of unknown 0 or different etiology and a procedure is NOT performed to that wound X - Simple Wound Assessment / Reassessment - one wound 1 5 []  - Complex Wound Assessment / Reassessment - multiple wounds 0 []  - Dermatologic / Skin Assessment (not related to wound area) 0 ASSESSMENTS - Focused Assessment []  - Circumferential Edema Measurements - multi extremities 0 []  - Nutritional Assessment / Counseling / Intervention 0 []  - Lower Extremity Assessment (monofilament, tuning fork, pulses) 0 []  - Peripheral Arterial Disease Assessment (using hand held doppler) 0 ASSESSMENTS - Ostomy and/or Continence Assessment and Care []  - Incontinence Assessment and Management 0 []  - Ostomy Care Assessment and Management (repouching, etc.) 0 PROCESS - Coordination of Care []  - Points for Discharge Coordination can only be taken for a new wound of 0 unknown or different etiology and a procedure is NOT performed to that wound X - Simple Patient / Family Education for ongoing care 1 15 []  - Complex (extensive) Patient / Family Education for ongoing care 0 [name] []  - Staff obtains Consents, Records, Test Results / Process Orders 0 []  - Staff telephones HHA, Nursing Homes / Clarify orders / etc 0 []  - Routine Transfer to another Facility (non-emergent condition) 0 []  - Routine Hospital Admission (non-emergent condition) 0 []  - New Admissions / Biomedical engineer / Ordering NPWT, Apligraf, etc. 0 []  - Emergency Hospital Admission (emergent condition) 0 X - Simple Discharge Coordination 1 10 []  - Complex (extensive) Discharge Coordination 0 PROCESS - Special Needs []  - Pediatric / Minor Patient Management 0 []  -  Isolation Patient Management 0 []  - Hearing / Language / Visual special needs 0 []  - Assessment of Community assistance (  transportation, D/C planning, etc.) 0 []  - Additional assistance / Altered mentation 0 []  - Support Surface(s) Assessment (bed, cushion, seat, etc.) 0 INTERVENTIONS - Wound Cleansing / Measurement []  - Points for Wound Cleaning / Measurement, Wound Dressing, Specimen 0 Collection and Specimen taken to lab can only be taken for a new wound of unknown or different etiology and a procedure is NOT performed to that wound X - Simple Wound Cleansing - one wound 1 5 []  - Complex Wound Cleansing - multiple wounds 0 X - Wound Imaging (photographs - any number of wounds) 1 5 []  - Wound Tracing (instead of photographs) 0 X - Simple Wound Measurement - one wound 1 5 []  - Complex Wound Measurement - multiple wounds 0 INTERVENTIONS - Wound Dressings X - Small Wound Dressing one or multiple wounds 1 10 [name], [name] ([id number]) []  - Medium Wound Dressing one or multiple wounds 0 []  - Large Wound Dressing one or multiple wounds 0 INTERVENTIONS - Miscellaneous []  - External ear exam 0 []  - Specimen Collection (cultures, biopsies, blood, body fluids, etc.) 0 []  - Specimen(s) / Culture(s) sent or taken to Lab for analysis 0 []  - Patient Transfer (multiple staff / Harrel Lemon Lift / Similar devices) 0 []  - Simple Staple / Suture removal (25 or less) 0 []  - Complex Staple / Suture removal (26 or more) 0 []  - Hypo / Hyperglycemic Management (close monitor of Blood Glucose) 0 []  - Ankle / Brachial Index (ABI) - do not check if billed separately 0 X - Vital Signs 1 5 Has the patient been seen at the hospital within the last three years: Yes Total Score: 65 Level Of Care: New/Established - Level 2 Electronic Signature(s) Signed: 01/01/2015 3:49:12 PM By: [name] Cool, RN, BSN, Kim RN, BSN Entered By: [name] Cool, RN, BSN, [name] on 01/01/2015 15:23:49 [name]  ([id number]) -------------------------------------------------------------------------------- Encounter Discharge Information Details Patient Name: [name] Date of Service: 01/01/2015 9:45 AM Medical Record Number: [id number] Patient Account Number: [id number] Date of Birth/Sex: July 03, 1960 (54 y.o. Male) Treating RN: [name] Primary Care Physician: [name] Other Clinician: Referring Physician: Sena [name] Treating Physician/Extender: [name] in Treatment: 8 Encounter Discharge Information Items Discharge Pain Level: 0 Discharge Condition: Stable Ambulatory Status: Ambulatory Discharge Destination: Home Transportation: Private Auto Accompanied By: wife Schedule Follow-up Appointment: Yes Medication Reconciliation completed and provided to Patient/Care No Debraann Livingstone: Provided on Clinical Summary of Care: 01/01/2015 Form Type Recipient Paper Patient RM Electronic Signature(s) Signed: 01/01/2015 10:24:34 AM By: Ruthine Dose Entered By: Ruthine Dose on 01/01/2015 10:24:34 Fullam, Pearletha [name] ([id number]) -------------------------------------------------------------------------------- Multi Wound Chart Details Patient Name: [name] Date of Service: 01/01/2015 9:45 AM Medical Record Number: [id number] Patient Account Number: [id number] Date of Birth/Sex: 07/01/1960 (54 y.o. Male) Treating RN: [name] Primary Care Physician: [name] Other Clinician: Referring Physician: Sena [name] Treating Physician/Extender: [name] in Treatment: 8 Vital Signs Height(in): 73 Pulse(bpm): 77 Weight(lbs): 132 Blood Pressure 128/76 (mmHg): Body Mass Index(BMI): 17 Temperature(F): 97.7 Respiratory Rate 20 (breaths/min): Photos: [1:No Photos] [2:No Photos] [N/A:N/A] Wound Location: [1:Left Ischial Tuberosity] [2:Left Hand - 2nd Digit - Circumfernential] [N/A:N/A] Wounding Event: [1:Gradually Appeared] [2:Gradually  Appeared] [N/A:N/A] Primary Etiology: [1:Malignant Wound] [2:Cellulitis] [N/A:N/A] Comorbid History: [1:Chronic Obstructive Pulmonary Disease (COPD), Arrhythmia, Hypertension, Received Radiation] [2:Chronic Obstructive Pulmonary Disease (COPD), Arrhythmia, Hypertension, Received Radiation] [N/A:N/A] Date Acquired: [1:10/19/2014] [2:12/24/2014] [N/A:N/A] Weeks of Treatment: [1:8] [2:0] [N/A:N/A] Wound Status: [1:Open] [2:Open] [N/A:N/A] Measurements L x W x D 1x1x0.1 [2:5x8.5x0.1] [N/A:N/A] (cm) Area (cm) : [  1:0.785] [2:33.379] [N/A:N/A] Volume (cm) : [1:0.079] [2:3.338] [N/A:N/A] % Reduction in Area: [1:77.30%] [2:N/A] [N/A:N/A] % Reduction in Volume: 77.20% [2:N/A] [N/A:N/A] Classification: [1:Partial Thickness] [2:Partial Thickness] [N/A:N/A] Exudate Amount: [1:Small] [2:Medium] [N/A:N/A] Exudate Type: [1:Serosanguineous] [2:Serous] [N/A:N/A] Exudate Color: [1:red, brown] [2:amber] [N/A:N/A] Wound Margin: [1:Distinct, outline attached] [2:Flat and Intact] [N/A:N/A] Granulation Amount: [1:Small (1-33%)] [2:Small (1-33%)] [N/A:N/A] Granulation Quality: [1:Pink] [2:Pale] [N/A:N/A] Necrotic Amount: [1:Large (67-100%)] [2:Large (67-100%)] [N/A:N/A] Exposed Structures: [1:Fascia: No Fat: No Tendon: No] [2:Fascia: No Fat: No Tendon: No] [N/A:N/A] Muscle: No Muscle: No Joint: No Joint: No Bone: No Bone: No Limited to Skin Limited to Skin Breakdown Breakdown Epithelialization: Small (1-33%) N/A N/A Periwound Skin Texture: Edema: No Edema: No N/A Excoriation: No Excoriation: No Induration: No Induration: No Callus: No Callus: No Crepitus: No Crepitus: No Fluctuance: No Fluctuance: No Friable: No Friable: No Rash: No Rash: No Scarring: No Scarring: No Periwound Skin Moist: Yes Maceration: Yes N/A Moisture: Maceration: No Moist: Yes Dry/Scaly: No Dry/Scaly: No Periwound Skin Color: Erythema: Yes Atrophie Blanche: No N/A Hemosiderin Staining: Yes Cyanosis: No Atrophie  Blanche: No Ecchymosis: No Cyanosis: No Erythema: No Ecchymosis: No Hemosiderin Staining: No Mottled: No Mottled: No Pallor: No Pallor: No Rubor: No Rubor: No Erythema Location: Circumferential N/A N/A Erythema Measurement: Measured: 7cm N/A N/A Temperature: No Abnormality N/A N/A Tenderness on Yes No N/A Palpation: Wound Preparation: Ulcer Cleansing: Ulcer Cleansing: N/A Rinsed/Irrigated with Rinsed/Irrigated with Saline Saline Topical Anesthetic Topical Anesthetic Applied: Other: lidocaine Applied: Other: lidocaine 4% 4% Treatment Notes Electronic Signature(s) Signed: 01/01/2015 3:49:12 PM By: [name] Cool, RN, BSN, Kim RN, BSN Entered By: [name] Cool, RN, BSN, [name] on 01/01/2015 10:00:46 Joshi, Pearletha [name] ([id number]) -------------------------------------------------------------------------------- [place] Details Patient Name: [name] Date of Service: 01/01/2015 9:45 AM Medical Record Number: [id number] Patient Account Number: [id number] Date of Birth/Sex: Oct 19, 1960 (54 y.o. Male) Treating RN: [name] Primary Care Physician: [name] Other Clinician: Referring Physician: Sena [name] Treating Physician/Extender: [name] in Treatment: 8 Active Inactive Orientation to the Wound Care Program Nursing Diagnoses: Knowledge deficit related to the wound healing center program Goals: Patient/caregiver will verbalize understanding of the [facility] Program Date Initiated: 11/19/2014 Goal Status: Active Interventions: Provide education on orientation to the wound center Notes: Pressure Nursing Diagnoses: Knowledge deficit related to causes and risk factors for pressure ulcer development Goals: Patient will remain free of pressure ulcers Date Initiated: 11/19/2014 Goal Status: Active Interventions: Assess: immobility, friction, shearing, incontinence upon admission and as needed Treatment Activities: Patient  referred for seating evaluation to ensure proper offloading : 01/01/2015 Notes: Wound/Skin Impairment Nursing Diagnoses: Impaired tissue integrity Mexicano, Caio A. ([id number]) Goals: Ulcer/skin breakdown will heal within 14 weeks Date Initiated: 11/19/2014 Goal Status: Active Interventions: Assess ulceration(s) every visit Treatment Activities: Topical wound management initiated : 01/01/2015 Notes: Electronic Signature(s) Signed: 01/01/2015 3:49:12 PM By: [name] Cool, RN, BSN, Kim RN, BSN Entered By: [name] Cool, RN, BSN, [name] on 01/01/2015 10:00:39 Cavell, Pearletha [name] ([id number]) -------------------------------------------------------------------------------- Pain Assessment Details Patient Name: [name] Date of Service: 01/01/2015 9:45 AM Medical Record Number: [id number] Patient Account Number: [id number] Date of Birth/Sex: May 01, 1960 (54 y.o. Male) Treating RN: [name] Primary Care Physician: [name] Other Clinician: Referring Physician: Sena [name] Treating Physician/Extender: [name] in Treatment: 8 Active Problems Location of Pain Severity and Description of Pain Patient Has Paino No Site Locations Pain Management and Medication Current Pain Management: Electronic Signature(s) Signed: 01/01/2015 3:49:12 PM By: [name] Cool, RN, BSN, Kim RN, BSN Entered By: [name] Cool, RN, BSN, [name]  on 01/01/2015 09:49:29 DANELLE, SANDSTROM ([id number]) -------------------------------------------------------------------------------- Patient/Caregiver Education Details Patient Name: [name] Date of Service: 01/01/2015 9:45 AM Medical Record Number: [id number] Patient Account Number: [id number] Date of Birth/Gender: 1960-11-05 (54 y.o. Male) Treating RN: [name] Primary Care Physician: [name] Other Clinician: Referring Physician: Sena [name] Treating Physician/Extender: [name] in Treatment: 8 Education Assessment Education Provided  To: Patient Education Topics Provided Wound/Skin Impairment: Handouts: Caring for Your Ulcer, Other: wound care as precribed Methods: Demonstration, Explain/Verbal Responses: State content correctly Electronic Signature(s) Signed: 01/01/2015 3:49:12 PM By: [name] Cool, RN, BSN, Kim RN, BSN Entered By: [name] Cool, RN, BSN, [name] on 01/01/2015 10:21:57 Sramek, Pearletha [name] ([id number]) -------------------------------------------------------------------------------- Wound Assessment Details Patient Name: [name] Date of Service: 01/01/2015 9:45 AM Medical Record Number: [id number] Patient Account Number: [id number] Date of Birth/Sex: July 02, 1960 (54 y.o. Male) Treating RN: [name] Primary Care Physician: [name] Other Clinician: Referring Physician: Sena [name] Treating Physician/Extender: [name] in Treatment: 8 Wound Status Wound Number: 1 Primary Malignant Wound Etiology: Wound Location: Left Ischial Tuberosity Wound Open Wounding Event: Gradually Appeared Status: Date Acquired: 10/19/2014 Comorbid Chronic Obstructive Pulmonary Weeks Of Treatment: 8 History: Disease (COPD), Arrhythmia, Clustered Wound: No Hypertension, Received Radiation Photos Photo Uploaded By: [name] Cool, RN, BSN, [name] on 01/01/2015 17:07:16 Wound Measurements Length: (cm) 1 Width: (cm) 1 Depth: (cm) 0.1 Area: (cm) 0.785 Volume: (cm) 0.079 % Reduction in Area: 77.3% % Reduction in Volume: 77.2% Epithelialization: Small (1-33%) Wound Description Classification: Partial Thickness Wound Margin: Distinct, outline attached Exudate Amount: Small Exudate Type: Serosanguineous Exudate Color: red, brown Foul Odor After Cleansing: No Wound Bed Granulation Amount: Small (1-33%) Exposed Structure Granulation Quality: Pink Fascia Exposed: No Necrotic Amount: Large (67-100%) Fat Layer Exposed: No Necrotic Quality: Adherent Slough Tendon Exposed: No Primiano, Siddhant A. ([id number]) Muscle  Exposed: No Joint Exposed: No Bone Exposed: No Limited to Skin Breakdown Periwound Skin Texture Texture Color No Abnormalities Noted: No No Abnormalities Noted: No Callus: No Atrophie Blanche: No Crepitus: No Cyanosis: No Excoriation: No Ecchymosis: No Fluctuance: No Erythema: Yes Friable: No Erythema Location: Circumferential Induration: No Erythema Measurement: Measured Localized Edema: No 7 cm Rash: No Hemosiderin Staining: Yes Scarring: No Mottled: No Pallor: No Moisture Rubor: No No Abnormalities Noted: No Dry / Scaly: No Temperature / Pain Maceration: No Temperature: No Abnormality Moist: Yes Tenderness on Palpation: Yes Wound Preparation Ulcer Cleansing: Rinsed/Irrigated with Saline Topical Anesthetic Applied: Other: lidocaine 4%, Treatment Notes Wound #1 (Left Ischial Tuberosity) 1. Cleansed with: Clean wound with Normal Saline 2. Anesthetic Topical Lidocaine 4% cream to wound bed prior to debridement 4. Dressing Applied: Santyl Ointment 5. Secondary Dressing Applied Bordered Foam Dressing Electronic Signature(s) Signed: 01/01/2015 3:49:12 PM By: [name] Cool, RN, BSN, Kim RN, BSN Entered By: [name] Cool, RN, BSN, [name] on 01/01/2015 10:00:31 [name] -------------------------------------------------------------------------------- Wound Assessment Details Patient Name: [name] Date of Service: 01/01/2015 9:45 AM Medical Record Number: [id number] Patient Account Number: [id number] Date of Birth/Sex: 07-03-1960 (54 y.o. Male) Treating RN: [name] Primary Care Physician: [name] Other Clinician: Referring Physician: Sena [name] Treating Physician/Extender: [name] in Treatment: 8 Wound Status Wound Number: 2 Primary Cellulitis Etiology: Wound Location: Left Hand - 2nd Digit - Circumfernential Wound Open Status: Wounding Event: Gradually Appeared Comorbid Chronic Obstructive Pulmonary Date Acquired:  12/24/2014 History: Disease (COPD), Arrhythmia, Weeks Of Treatment: 0 Hypertension, Received Radiation Clustered Wound: No Photos Photo Uploaded By: [name] Cool, RN, BSN, [name] on 01/01/2015 17:07:33 Wound Measurements Length: (cm) 5 Width: (cm) 8.5  Depth: (cm) 0.1 Area: (cm) 33.379 Volume: (cm) 3.338 % Reduction in Area: % Reduction in Volume: Tunneling: No Undermining: No Wound Description Classification: Partial Thickness Wound Margin: Flat and Intact Exudate Amount: Medium Exudate Type: Serous Exudate Color: amber Wound Bed Granulation Amount: Small (1-33%) Exposed Structure Granulation Quality: Pale Fascia Exposed: No Necrotic Amount: Large (67-100%) Fat Layer Exposed: No Gikas, Kawon A. ([id number]) Necrotic Quality: Adherent Slough Tendon Exposed: No Muscle Exposed: No Joint Exposed: No Bone Exposed: No Limited to Skin Breakdown Periwound Skin Texture Texture Color No Abnormalities Noted: No No Abnormalities Noted: No Callus: No Atrophie Blanche: No Crepitus: No Cyanosis: No Excoriation: No Ecchymosis: No Fluctuance: No Erythema: No Friable: No Hemosiderin Staining: No Induration: No Mottled: No Localized Edema: No Pallor: No Rash: No Rubor: No Scarring: No Moisture No Abnormalities Noted: No Dry / Scaly: No Maceration: Yes Moist: Yes Wound Preparation Ulcer Cleansing: Rinsed/Irrigated with Saline Topical Anesthetic Applied: Other: lidocaine 4%, Treatment Notes Wound #2 (Left, Circumferential Hand - 2nd Digit) 1. Cleansed with: Clean wound with Normal Saline 2. Anesthetic Topical Lidocaine 4% cream to wound bed prior to debridement 4. Dressing Applied: Other dressing (specify in notes) 5. Secondary Dressing Applied Kerlix/Conform Notes sorbact, conform, stretchnet Electronic Signature(s) Signed: 01/01/2015 3:49:12 PM By: [name] Cool, RN, BSN, Kim RN, BSN Entered By: [name] Cool, RN, BSN, [name] on 01/01/2015 09:57:28 Park, Pearletha [name]  ([id number]) -------------------------------------------------------------------------------- [place] Details Patient Name: [name] Date of Service: 01/01/2015 9:45 AM Medical Record Number: [id number] Patient Account Number: [id number] Date of Birth/Sex: 1960-09-04 (54 y.o. Male) Treating RN: [name] Primary Care Physician: [name] Other Clinician: Referring Physician: Sena [name] Treating Physician/Extender: [name] in Treatment: 8 Vital Signs Time Taken: 09:51 Temperature (F): 97.7 Height (in): 73 Pulse (bpm): 77 Weight (lbs): 132 Respiratory Rate (breaths/min): 20 Body Mass Index (BMI): 17.4 Blood Pressure (mmHg): 128/76 Reference Range: 80 - 120 mg / dl Pulse Oximetry (%): 98 Notes Patient placed on 3L of O2 while in clinic. Electronic Signature(s) Signed: 01/01/2015 3:49:12 PM By: [name] Cool, RN, BSN, Kim RN, BSN Entered By: [name] Cool, RN, BSN, [name] on 01/01/2015 09:53:09

## 2015-01-06 ENCOUNTER — Inpatient Hospital Stay: Payer: Medicaid Other | Attending: Internal Medicine

## 2015-01-06 ENCOUNTER — Encounter: Payer: Self-pay | Admitting: Internal Medicine

## 2015-01-06 ENCOUNTER — Inpatient Hospital Stay: Payer: Medicaid Other

## 2015-01-06 ENCOUNTER — Inpatient Hospital Stay (HOSPITAL_BASED_OUTPATIENT_CLINIC_OR_DEPARTMENT_OTHER): Payer: Medicaid Other | Admitting: Internal Medicine

## 2015-01-06 VITALS — BP 136/83 | HR 84 | Temp 96.4°F | Wt 148.6 lb

## 2015-01-06 DIAGNOSIS — R600 Localized edema: Secondary | ICD-10-CM | POA: Diagnosis not present

## 2015-01-06 DIAGNOSIS — Z950 Presence of cardiac pacemaker: Secondary | ICD-10-CM | POA: Insufficient documentation

## 2015-01-06 DIAGNOSIS — G8929 Other chronic pain: Secondary | ICD-10-CM | POA: Diagnosis not present

## 2015-01-06 DIAGNOSIS — L03012 Cellulitis of left finger: Secondary | ICD-10-CM | POA: Diagnosis not present

## 2015-01-06 DIAGNOSIS — R509 Fever, unspecified: Secondary | ICD-10-CM | POA: Diagnosis not present

## 2015-01-06 DIAGNOSIS — F17210 Nicotine dependence, cigarettes, uncomplicated: Secondary | ICD-10-CM | POA: Diagnosis not present

## 2015-01-06 DIAGNOSIS — I509 Heart failure, unspecified: Secondary | ICD-10-CM | POA: Insufficient documentation

## 2015-01-06 DIAGNOSIS — F419 Anxiety disorder, unspecified: Secondary | ICD-10-CM | POA: Insufficient documentation

## 2015-01-06 DIAGNOSIS — L089 Local infection of the skin and subcutaneous tissue, unspecified: Secondary | ICD-10-CM | POA: Diagnosis not present

## 2015-01-06 DIAGNOSIS — J449 Chronic obstructive pulmonary disease, unspecified: Secondary | ICD-10-CM

## 2015-01-06 DIAGNOSIS — I472 Ventricular tachycardia: Secondary | ICD-10-CM | POA: Insufficient documentation

## 2015-01-06 DIAGNOSIS — J189 Pneumonia, unspecified organism: Secondary | ICD-10-CM | POA: Diagnosis not present

## 2015-01-06 DIAGNOSIS — I2510 Atherosclerotic heart disease of native coronary artery without angina pectoris: Secondary | ICD-10-CM

## 2015-01-06 DIAGNOSIS — Z79899 Other long term (current) drug therapy: Secondary | ICD-10-CM

## 2015-01-06 DIAGNOSIS — C9000 Multiple myeloma not having achieved remission: Secondary | ICD-10-CM

## 2015-01-06 DIAGNOSIS — Z9221 Personal history of antineoplastic chemotherapy: Secondary | ICD-10-CM | POA: Insufficient documentation

## 2015-01-06 DIAGNOSIS — Z7982 Long term (current) use of aspirin: Secondary | ICD-10-CM | POA: Diagnosis not present

## 2015-01-06 DIAGNOSIS — M791 Myalgia: Secondary | ICD-10-CM | POA: Insufficient documentation

## 2015-01-06 DIAGNOSIS — M8580 Other specified disorders of bone density and structure, unspecified site: Secondary | ICD-10-CM | POA: Diagnosis not present

## 2015-01-06 DIAGNOSIS — E785 Hyperlipidemia, unspecified: Secondary | ICD-10-CM

## 2015-01-06 DIAGNOSIS — K219 Gastro-esophageal reflux disease without esophagitis: Secondary | ICD-10-CM | POA: Insufficient documentation

## 2015-01-06 DIAGNOSIS — I252 Old myocardial infarction: Secondary | ICD-10-CM | POA: Diagnosis not present

## 2015-01-06 DIAGNOSIS — I4729 Other ventricular tachycardia: Secondary | ICD-10-CM | POA: Insufficient documentation

## 2015-01-06 DIAGNOSIS — I429 Cardiomyopathy, unspecified: Secondary | ICD-10-CM | POA: Insufficient documentation

## 2015-01-06 LAB — CBC WITH DIFFERENTIAL/PLATELET
BASOS PCT: 1 %
Basophils Absolute: 0.1 10*3/uL (ref 0–0.1)
Eosinophils Absolute: 0.1 10*3/uL (ref 0–0.7)
Eosinophils Relative: 1 %
HEMATOCRIT: 32.9 % — AB (ref 40.0–52.0)
HEMOGLOBIN: 10.9 g/dL — AB (ref 13.0–18.0)
LYMPHS PCT: 14 %
Lymphs Abs: 1.3 10*3/uL (ref 1.0–3.6)
MCH: 30.8 pg (ref 26.0–34.0)
MCHC: 33.2 g/dL (ref 32.0–36.0)
MCV: 93 fL (ref 80.0–100.0)
MONO ABS: 0.8 10*3/uL (ref 0.2–1.0)
MONOS PCT: 8 %
NEUTROS ABS: 7.2 10*3/uL — AB (ref 1.4–6.5)
NEUTROS PCT: 76 %
Platelets: 397 10*3/uL (ref 150–440)
RBC: 3.54 MIL/uL — ABNORMAL LOW (ref 4.40–5.90)
RDW: 17.3 % — AB (ref 11.5–14.5)
WBC: 9.5 10*3/uL (ref 3.8–10.6)

## 2015-01-06 LAB — COMPREHENSIVE METABOLIC PANEL
ALBUMIN: 3.1 g/dL — AB (ref 3.5–5.0)
ALK PHOS: 44 U/L (ref 38–126)
ALT: 11 U/L — ABNORMAL LOW (ref 17–63)
AST: 13 U/L — AB (ref 15–41)
Anion gap: 7 (ref 5–15)
BILIRUBIN TOTAL: 0.3 mg/dL (ref 0.3–1.2)
BUN: 8 mg/dL (ref 6–20)
CALCIUM: 8.6 mg/dL — AB (ref 8.9–10.3)
CO2: 29 mmol/L (ref 22–32)
Chloride: 102 mmol/L (ref 101–111)
Creatinine, Ser: 0.95 mg/dL (ref 0.61–1.24)
GFR calc Af Amer: >60 mL/min (ref >60)
GLUCOSE: 104 mg/dL — AB (ref 65–99)
POTASSIUM: 3.8 mmol/L (ref 3.5–5.1)
Sodium: 138 mmol/L (ref 135–145)
TOTAL PROTEIN: 6.3 g/dL — AB (ref 6.5–8.1)

## 2015-01-06 MED ORDER — FENTANYL 100 MCG/HR TD PT72: 100.0000 ug | MEDICATED_PATCH | TRANSDERMAL | Status: DC

## 2015-01-06 MED ORDER — IPRATROPIUM-ALBUTEROL 0.5-2.5 (3) MG/3ML IN SOLN: 3.0000 mL | RESPIRATORY_TRACT | Status: DC | PRN

## 2015-01-06 MED ORDER — FUROSEMIDE 20 MG PO TABS: 20.0000 mg | ORAL_TABLET | Freq: Every day | ORAL | Status: DC

## 2015-01-06 MED ORDER — HYDROMORPHONE HCL 2 MG PO TABS: 2.0000 mg | ORAL_TABLET | ORAL | Status: DC | PRN

## 2015-01-06 MED ORDER — ALPRAZOLAM 0.5 MG PO TABS: 0.5000 mg | ORAL_TABLET | Freq: Three times a day (TID) | ORAL | Status: DC | PRN

## 2015-01-06 MED ORDER — PROMETHAZINE HCL 25 MG PO TABS: 25.0000 mg | ORAL_TABLET | ORAL | Status: DC | PRN

## 2015-01-06 MED ORDER — FENTANYL 25 MCG/HR TD PT72
25.0000 ug | MEDICATED_PATCH | TRANSDERMAL | Status: DC
Start: 2015-01-06 — End: 2015-01-20

## 2015-01-06 MED ORDER — FENTANYL 12 MCG/HR TD PT72: 12.0000 ug | MEDICATED_PATCH | TRANSDERMAL | Status: DC

## 2015-01-06 NOTE — Progress Notes (Signed)
[facility] OFFICE PROGRESS NOTE  Patient Care Team: Petra Kuba, MD as PCP - General (Family Medicine)   SUMMARY OF ONCOLOGIC HISTORY:  # SEP 2015- MULTIPLE MYELOMA [multiple PET pos Bone lesions; hypercalcemia s/p BMBx; FISH- Aneuploidy - gain of chromosome 7,9,15,FGFR3/4p16.3, and CCND1/11q13. Loss of MAF/16q23.1), cytogenetics normal 46XY] s/p Vel-Dex-Rev-Zometa; Excellent PR   # MARCH 2016- REV-DEX   # Chronic pain  # COPD/smoking/ [[facility]- not candidate for BMT- sec to co-morbidities/poor nutritional status];     INTERVAL HISTORY:  This is my first interaction with the patient since I joined the practice September 2016. I reviewed the patient's prior charts/pertinent labs/imaging in detail; findings are summarized above.   Unfortunate 54 year old male patient with above history of multiple myeloma on maintenance Revlimid-dexamethasone [currently on hold] was recently evaluated in the hospital when he was admitted for bronchitis/COPD; also possible cellulitis of his left finger.  Patient continues to complain of shortness of breath or cough. Denies any hemoptysis. Denies any fevers. He just finished antibiotics 2 days ago.  No chest pain; overall continued aches and pains in his rest of his bones. He continues to be on fentanyl patch/Dilaudid pills.   His wound of the left finger is improving; and also the left lateral buttock wound is healing. He is going to wound care. Also complaining of swelling in the legs.    REVIEW OF SYSTEMS:  A complete 10 point review of system is done which is negative except mentioned above/history of present illness.   PAST MEDICAL HISTORY :  Past Medical History  Diagnosis Date  . Multiple myeloma ([place])   . Multiple myeloma ([place])   . Anxiety   . CHF (congestive heart failure) ([place])   . COPD (chronic obstructive pulmonary disease) ([place])   . GERD (gastroesophageal reflux disease)   . Substance abuse   . Myocardial  infarction ([place])   . Coronary artery disease   . Atrial septal defect   . Hyperlipidemia   . Dysrhythmias     PAST SURGICAL HISTORY :  History reviewed. No pertinent past surgical history.  FAMILY HISTORY :   Family History  Problem Relation Age of Onset  . COPD Mother   . CAD Mother   . Heart attack Father     SOCIAL HISTORY:   Social History  Substance Use Topics  . Smoking status: Current Every Day Smoker -- 0.50 packs/day for 40 years    Types: Cigarettes  . Smokeless tobacco: None  . Alcohol Use: No    ALLERGIES:  has No Known Allergies.  MEDICATIONS:  Current Outpatient Prescriptions  Medication Sig Dispense Refill  . albuterol (PROVENTIL HFA;VENTOLIN HFA) 108 (90 BASE) MCG/ACT inhaler Inhale 2 puffs into the lungs every 6 (six) hours as needed for wheezing or shortness of breath.    . ALPRAZolam (XANAX) 0.5 MG tablet Take 1-2 tablets (0.5-1 mg total) by mouth 3 (three) times daily as needed for anxiety. 30 tablet 0  . amiodarone (PACERONE) 200 MG tablet Take 200 mg by mouth daily.    Marland Kitchen aspirin 325 MG EC tablet Take 325 mg by mouth daily.    Marland Kitchen atorvastatin (LIPITOR) 20 MG tablet Take 20 mg by mouth daily.    . carvedilol (COREG) 6.25 MG tablet Take 6.25 mg by mouth 2 (two) times daily.     . collagenase (SANTYL) ointment Apply topically daily. 15 g 0  . dexamethasone (DECADRON) 4 MG tablet Take 4 mg by mouth once a week. Pt  takes on Friday.    . docusate sodium (COLACE) 100 MG capsule Take 100 mg by mouth 2 (two) times daily.    . fentaNYL (DURAGESIC - DOSED MCG/HR) 100 MCG/HR Place 100 mcg onto the skin every other day. Pt uses in addition to a 29mg and 236m patch.    . fentaNYL (DURAGESIC - DOSED MCG/HR) 12 MCG/HR Place 12 mcg onto the skin every other day. Pt uses in addition to a 2522mand 100m22match.    . fentaNYL (DURAGESIC - DOSED MCG/HR) 25 MCG/HR patch Place 25 mcg onto the skin every other day. Pt uses in addition to a 12mc3md 100mcg53mch.    .  Fluticasone-Salmeterol (ADVAIR) 100-50 MCG/DOSE AEPB Inhale 1 puff into the lungs 2 (two) times daily.    . gabaMarland Kitchenentin (NEURONTIN) 100 MG capsule Take 1 capsule (100 mg total) by mouth 3 (three) times daily. 90 capsule 3  . HYDROmorphone (DILAUDID) 2 MG tablet Take 1-2 tablets (2-4 mg total) by mouth every 2 (two) hours as needed for severe pain. 130 tablet 0  . ibuprofen (ADVIL,MOTRIN) 200 MG tablet Take 400 mg by mouth every 6 (six) hours as needed for fever.    . lenaMarland Kitchenidomide (REVLIMID) 25 MG capsule Take 25 mg by mouth daily. Pt takes one capsule daily for 21 days, then 7 days off.    . lisinopril (PRINIVIL,ZESTRIL) 2.5 MG tablet Take 2.5 mg by mouth daily.    . polyethylene glycol (MIRALAX / GLYCOLAX) packet Take 17 g by mouth daily.    . promethazine (PHENERGAN) 25 MG tablet Take 1 tablet (25 mg total) by mouth every 4 (four) hours as needed for nausea or vomiting. 60 tablet 2  . silver sulfADIAZINE (SILVADENE) 1 % cream Apply topically daily. 50 g 0  . tiotropium (SPIRIVA) 18 MCG inhalation capsule Place 1 capsule (18 mcg total) into inhaler and inhale daily. 30 capsule 12   No current facility-administered medications for this visit.    PHYSICAL EXAMINATION: ECOG PERFORMANCE STATUS: 2 - Symptomatic, <50% confined to bed  BP 136/83 mmHg  Pulse 84  Temp(Src) 96.4 F (35.8 C) (Tympanic)  Wt 148 lb 9.4 oz (67.4 kg)  SpO2 91%  Filed Weights   01/06/15 1016  Weight: 148 lb 9.4 oz (67.4 kg)    GENERAL: Moderately nourished moderately CAUCASIAN male patient. Alert, no distress and comfortable.  Accompanied by family.  EYES: no pallor or icterus OROPHARYNX: no thrush or ulceration; poor dentition. NECK: supple, no masses felt LYMPH:  no palpable lymphadenopathy in the cervical, axillary or inguinal regions LUNGS: With decreased bilateral breath sounds. No wheeze or crackles HEART/CVS: regular rate & rhythm and no murmurs; 1+ bilateral lower extremity edema. ABDOMEN:abdomen soft,  non-tender and normal bowel sounds Musculoskeletal:no cyanosis of digits and no clubbing  PSYCH: alert & oriented x 3 with fluent speech NEURO: no focal motor/sensory deficits SKIN:  no rashes or significant lesions  LABORATORY DATA:  I have reviewed the data as listed    Component Value Date/Time   NA 138 01/06/2015 0943   NA 135 05/12/2014 1343   K 3.8 01/06/2015 0943   K 3.3* 05/12/2014 1343   CL 102 01/06/2015 0943   CL 101 05/12/2014 1343   CO2 29 01/06/2015 0943   CO2 27 05/12/2014 1343   GLUCOSE 104* 01/06/2015 0943   GLUCOSE 77 05/12/2014 1343   BUN 8 01/06/2015 0943   BUN 9 05/12/2014 1343   CREATININE 0.95 01/06/2015 0943   CREATININE 0.89  05/12/2014 1343   CALCIUM 8.6* 01/06/2015 0943   CALCIUM 8.8* 05/12/2014 1343   PROT 6.3* 01/06/2015 0943   PROT 5.9* 02/17/2014 1405   ALBUMIN 3.1* 01/06/2015 0943   ALBUMIN 2.8* 02/17/2014 1405   AST 13* 01/06/2015 0943   AST 9* 02/17/2014 1405   ALT 11* 01/06/2015 0943   ALT 12* 02/17/2014 1405   ALKPHOS 44 01/06/2015 0943   ALKPHOS 44* 02/17/2014 1405   BILITOT 0.3 01/06/2015 0943   BILITOT 0.2 02/17/2014 1405   GFRNONAA >60 01/06/2015 0943   GFRNONAA >60 05/12/2014 1343   GFRNONAA >60 02/17/2014 1405   GFRAA >60 01/06/2015 0943   GFRAA >60 05/12/2014 1343   GFRAA >60 02/17/2014 1405    No results found for: SPEP, UPEP  Lab Results  Component Value Date   WBC 9.5 01/06/2015   NEUTROABS 7.2* 01/06/2015   HGB 10.9* 01/06/2015   HCT 32.9* 01/06/2015   MCV 93.0 01/06/2015   PLT 397 01/06/2015      Chemistry      Component Value Date/Time   NA 138 01/06/2015 0943   NA 135 05/12/2014 1343   K 3.8 01/06/2015 0943   K 3.3* 05/12/2014 1343   CL 102 01/06/2015 0943   CL 101 05/12/2014 1343   CO2 29 01/06/2015 0943   CO2 27 05/12/2014 1343   BUN 8 01/06/2015 0943   BUN 9 05/12/2014 1343   CREATININE 0.95 01/06/2015 0943   CREATININE 0.89 05/12/2014 1343      Component Value Date/Time   CALCIUM 8.6*  01/06/2015 0943   CALCIUM 8.8* 05/12/2014 1343   ALKPHOS 44 01/06/2015 0943   ALKPHOS 44* 02/17/2014 1405   AST 13* 01/06/2015 0943   AST 9* 02/17/2014 1405   ALT 11* 01/06/2015 0943   ALT 12* 02/17/2014 1405   BILITOT 0.3 01/06/2015 0943   BILITOT 0.2 02/17/2014 1405       RADIOGRAPHIC STUDIES: I have personally reviewed the radiological images as listed and agreed with the findings in the report. No results found.   ASSESSMENT & PLAN:   # MULTIPLE MYELOMA- status post induction Velcade Revlimid and dexamethasone with a very good partial response. Patient is currently on Revlimid and dexamethasone maintenance. This is currently on hold because of patient infectious issues/recent hospitalization. Most recent-September 2016 M protein was 0.5 g/dL; kappa lambda light chain ratio is 10. Labs from today are pending. Otherwise today creatinine is stable at 0.95 normal calcium; hemoglobin stable at 10.9. Platelets are 397. For now continue to hold Revlimid dexamethasone given his recent infectious issues.   # COPD- ongoing cough /not significant improvement. I recommend Combivent with nebulizer. Also recommend a CT scan of the chest.   # Left index finger cellulitis/ Left hip infection healing. Continue with wound care.  #  Swelling in the legs recommend- low-dose of Lasix.  # chronic pain/anxiety- on fentanyl and Dilaudid; Xanax. Medications refilled  # Will reevaluate the patient in 1 week.  # 25 minutes face-to-face with the patient discussing the above plan of care; more than 50% of time spent on prognosis/ natural history; counseling and coordination.       Cammie Sickle, MD 01/06/2015 10:32 AM

## 2015-01-07 LAB — KAPPA/LAMBDA LIGHT CHAINS
KAPPA FREE LGHT CHN: 79.18 mg/L — AB (ref 3.30–19.40)
Kappa, lambda light chain ratio: 10.07 — ABNORMAL HIGH (ref 0.26–1.65)
Lambda free light chains: 7.86 mg/L (ref 5.71–26.30)

## 2015-01-08 ENCOUNTER — Encounter: Payer: Medicaid Other | Admitting: Surgery

## 2015-01-08 DIAGNOSIS — L89223 Pressure ulcer of left hip, stage 3: Secondary | ICD-10-CM | POA: Diagnosis not present

## 2015-01-08 LAB — MULTIPLE MYELOMA PANEL, SERUM
ALBUMIN/GLOB SERPL: 1.1 (ref 0.7–1.7)
ALPHA2 GLOB SERPL ELPH-MCNC: 0.9 g/dL (ref 0.4–1.0)
Albumin SerPl Elph-Mcnc: 2.9 g/dL (ref 2.9–4.4)
Alpha 1: 0.3 g/dL (ref 0.0–0.4)
B-GLOBULIN SERPL ELPH-MCNC: 0.8 g/dL (ref 0.7–1.3)
GAMMA GLOB SERPL ELPH-MCNC: 0.8 g/dL (ref 0.4–1.8)
GLOBULIN, TOTAL: 2.8 g/dL (ref 2.2–3.9)
IGG (IMMUNOGLOBIN G), SERUM: 764 mg/dL (ref 700–1600)
IGM, SERUM: 48 mg/dL (ref 20–172)
IgA: 58 mg/dL — ABNORMAL LOW (ref 90–386)
M Protein SerPl Elph-Mcnc: 0.5 g/dL — ABNORMAL HIGH
Total Protein ELP: 5.7 g/dL — ABNORMAL LOW (ref 6.0–8.5)

## 2015-01-11 ENCOUNTER — Ambulatory Visit: Admission: RE | Admit: 2015-01-11 | Payer: Medicaid Other | Source: Ambulatory Visit

## 2015-01-12 ENCOUNTER — Other Ambulatory Visit: Payer: Self-pay | Admitting: *Deleted

## 2015-01-12 DIAGNOSIS — C9000 Multiple myeloma not having achieved remission: Secondary | ICD-10-CM

## 2015-01-13 ENCOUNTER — Inpatient Hospital Stay: Payer: Medicaid Other

## 2015-01-13 ENCOUNTER — Inpatient Hospital Stay: Payer: Medicaid Other | Admitting: Internal Medicine

## 2015-01-14 NOTE — Progress Notes (Signed)
[name], [name] ([id number]) Visit Report for 01/08/2015 Chief Complaint Document Details Patient Name: [name], [name] Date of Service: 01/08/2015 1:00 PM Medical Record Number: [id number] Patient Account Number: [id number] Date of Birth/Sex: 05-28-1960 (54 y.o. Male) Treating RN: Ahmed Prima Primary Care Physician: Sena Hitch Other Clinician: Referring Physician: Sena Hitch Treating Physician/Extender: Frann Rider in Treatment: 9 Information Obtained from: Patient Chief Complaint Patient is at the clinic for treatment of an open pressure ulcer. 54 year old gentleman with a ulcerated area on his left hip she's had for 3 weeks. Electronic Signature(s) Signed: 01/08/2015 1:49:23 PM By: Christin Fudge MD, FACS Entered By: Christin Fudge on 01/08/2015 13:49:23 Reffner, [name] ([id number]) -------------------------------------------------------------------------------- HPI Details Patient Name: [name] Date of Service: 01/08/2015 1:00 PM Medical Record Number: [id number] Patient Account Number: [id number] Date of Birth/Sex: 07-Jul-1960 (54 y.o. Male) Treating RN: Ahmed Prima Primary Care Physician: Sena Hitch Other Clinician: Referring Physician: Sena Hitch Treating Physician/Extender: Frann Rider in Treatment: 9 History of Present Illness Location: left hip area Quality: Patient reports experiencing a dull pain to affected area(s). Severity: Patient states wound are getting worse. Duration: Patient has had the wound for < 3 weeks prior to presenting for treatment Timing: Pain in wound is Intermittent (comes and goes Context: The wound appeared gradually over time Modifying Factors: Other treatment(s) tried include: local dressing changes with antibiotic cream Associated Signs and Symptoms: Patient reports having difficulty standing for long periods. HPI Description: The patient is under treatment with medical oncology Dr.  Ma Hillock sees him, for multiple myeloma, anemia, COPD, peripheral neuropathy, pedal edema and has been referred to [place] for left lateral hip pressure ulcer. his past medical history significant for coronary artery disease, dilated cardiomyopathy with an EF of 20% and has had her AICD placement, kidney stones, asthma, COPD, polysubstance abuse, anxiety. continues to smoke a pack of cigarettes a day, history of recreational drug usage and used to take IV drugs in the late 1980s. He was in bed for a long while due to an associated illness and lay on his left side for prolonged periods of time. He has been ambulatory otherwise and has a fairly poor nutrition. 01/01/2015 -- I have reviewed the notes whether the patient was recently admitted to the hospital between November 30 and 12/27/2014. He was admitted for cellulitis of the left second finger, COPD exacerbation and hypotension and was found that no surgical debridement was required after a surgical consult by Dr. Adonis Huguenin. he was treated with injectable vancomycin and Zosyn initially and then sent home on oral Augmentin. Electronic Signature(s) Signed: 01/08/2015 1:49:32 PM By: Christin Fudge MD, FACS Entered By: Christin Fudge on 01/08/2015 13:49:32 Rabadan, [name] ([id number]) -------------------------------------------------------------------------------- Physical Exam Details Patient Name: [name] Date of Service: 01/08/2015 1:00 PM Medical Record Number: [id number] Patient Account Number: [id number] Date of Birth/Sex: 05/09/1960 (53 y.o. Male) Treating RN: Ahmed Prima Primary Care Physician: Sena Hitch Other Clinician: Referring Physician: Sena Hitch Treating Physician/Extender: Frann Rider in Treatment: 9 Constitutional . Pulse regular. Respirations normal and unlabored. Afebrile. . Eyes Nonicteric. Reactive to light. Ears, Nose, Mouth, and Throat Lips, teeth, and gums WNL.Marland Kitchen Moist mucosa without  lesions . Neck supple and nontender. No palpable supraclavicular or cervical adenopathy. Normal sized without goiter. Respiratory WNL. No retractions.. Cardiovascular Pedal Pulses WNL. No clubbing, cyanosis or edema. Lymphatic No adneopathy. No adenopathy. No adenopathy. Musculoskeletal Adexa without tenderness or enlargement.. Digits and nails w/o clubbing, cyanosis, infection, petechiae, ischemia, or inflammatory conditions.. Integumentary (Hair, Skin)  No suspicious lesions. No crepitus or fluctuance. No peri-wound warmth or erythema. No masses.Marland Kitchen Psychiatric Judgement and insight Intact.. No evidence of depression, anxiety, or agitation.. Notes the left hip is looking better and is minimal debris and we will continue with Santyl ointment. The right index finger is looking much better has dried out nicely and there is some eschar and dry skin but overall is looking excellent. Electronic Signature(s) Signed: 01/08/2015 1:50:19 PM By: Christin Fudge MD, FACS Entered By: Christin Fudge on 01/08/2015 13:50:19 Jelinek, [name] ([id number]) -------------------------------------------------------------------------------- Physician Orders Details Patient Name: [name] Date of Service: 01/08/2015 1:00 PM Medical Record Number: [id number] Patient Account Number: [id number] Date of Birth/Sex: 1960/12/26 (54 y.o. Male) Treating RN: Ahmed Prima Primary Care Physician: Sena Hitch Other Clinician: Referring Physician: Sena Hitch Treating Physician/Extender: Frann Rider in Treatment: 9 Verbal / Phone Orders: Yes Clinician: Pinkerton, Debi Read Back and Verified: Yes Diagnosis Coding Wound Cleansing Wound #1 Left Ischial Tuberosity o Clean wound with Normal Saline. Wound #2 Left,Circumferential Hand - 2nd Digit o Clean wound with Normal Saline. Anesthetic Wound #1 Left Ischial Tuberosity o Topical Lidocaine 4% cream applied to wound bed prior to  debridement Wound #2 Left,Circumferential Hand - 2nd Digit o Topical Lidocaine 4% cream applied to wound bed prior to debridement Skin Barriers/Peri-Wound Care Wound #1 Left Ischial Tuberosity o Barrier cream Primary Wound Dressing Wound #1 Left Ischial Tuberosity o Santyl Ointment Wound #2 Left,Circumferential Hand - 2nd Digit o Other: - sorbact with hydrogel Secondary Dressing Wound #1 Left Ischial Tuberosity o Boardered Foam Dressing Wound #2 Left,Circumferential Hand - 2nd Digit o Conform/Kerlix - stretch net Dressing Change Frequency Wound #1 Left Ischial Tuberosity o Change dressing every day. DAVIDJAMES, BLANSETT A. ([id number]) Follow-up Appointments Wound #1 Left Ischial Tuberosity o Return Appointment in 2 weeks. Wound #2 Left,Circumferential Hand - 2nd Digit o Return Appointment in 2 weeks. Off-Loading Wound #1 Left Ischial Tuberosity o Turn and reposition every 2 hours Additional Orders / Instructions Wound #1 Left Ischial Tuberosity o Stop Smoking o Increase protein intake. o Other: - obtain vitamins;zinc, vitamin C Wound #2 Left,Circumferential Hand - 2nd Digit o Stop Smoking o Increase protein intake. o Other: - obtain vitamins;zinc, vitamin C Home Health Wound #1 Left Ischial [place] Visits - [place] Nurse may visit PRN to address patientos wound care needs. o FACE TO FACE ENCOUNTER: MEDICARE and MEDICAID PATIENTS: I certify that this patient is under my care and that I had a face-to-face encounter that meets the physician face-to-face encounter requirements with this patient on this date. The encounter with the patient was in whole or in part for the following MEDICAL CONDITION: (primary reason for [place]) MEDICAL NECESSITY: I certify, that based on my findings, NURSING services are a medically necessary home health service. HOME BOUND STATUS: I certify that my clinical  findings support that this patient is homebound (i.e., Due to illness or injury, pt requires aid of supportive devices such as crutches, cane, wheelchairs, walkers, the use of special transportation or the assistance of another person to leave their place of residence. There is a normal inability to leave the home and doing so requires considerable and taxing effort. Other absences are for medical reasons / religious services and are infrequent or of short duration when for other reasons). o If current dressing causes regression in wound condition, may D/C ordered dressing product/s and apply Normal Saline Moist Dressing daily until next [place] /  Other MD appointment. [place] of regression in wound condition at [phone]. o Please direct any NON-WOUND related issues/requests for orders to patient's Primary Care Physician Wound #2 Left,Circumferential Hand - 2nd Digit o [place] Visits - [facility] Please order the pt some [place] Nurse may visit PRN to address patientos wound care needs. EMMETTE, KATT ([id number]) o FACE TO FACE ENCOUNTER: MEDICARE and MEDICAID PATIENTS: I certify that this patient is under my care and that I had a face-to-face encounter that meets the physician face-to-face encounter requirements with this patient on this date. The encounter with the patient was in whole or in part for the following MEDICAL CONDITION: (primary reason for [place]) MEDICAL NECESSITY: I certify, that based on my findings, NURSING services are a medically necessary home health service. HOME BOUND STATUS: I certify that my clinical findings support that this patient is homebound (i.e., Due to illness or injury, pt requires aid of supportive devices such as crutches, cane, wheelchairs, walkers, the use of special transportation or the assistance of another person to leave their place of residence. There is a normal  inability to leave the home and doing so requires considerable and taxing effort. Other absences are for medical reasons / religious services and are infrequent or of short duration when for other reasons). o If current dressing causes regression in wound condition, may D/C ordered dressing product/s and apply Normal Saline Moist Dressing daily until next [place] / Other MD appointment. [place] of regression in wound condition at [phone]. o Please direct any NON-WOUND related issues/requests for orders to patient's Primary Care Physician Oxygen Administration Wound #1 Left Ischial Tuberosity o Pulse Oxymetry measurement in clinic - 98 o While patient is in clinic, provide supplimental oxygen via nasal cannula at liters/min __ : - 3L Wound #2 Left,Circumferential Hand - 2nd Digit o Pulse Oxymetry measurement in clinic - 98 o While patient is in clinic, provide supplimental oxygen via nasal cannula at liters/min __ : - 3L Electronic Signature(s) Signed: 01/12/2015 4:08:32 PM By: Christin Fudge MD, FACS Signed: 01/13/2015 4:19:08 PM By: Alric Quan Previous Signature: 01/08/2015 4:28:44 PM Version By: Christin Fudge MD, FACS Entered By: Alric Quan on 01/08/2015 16:48:08 [place], [name] ([id number]) -------------------------------------------------------------------------------- Problem List Details Patient Name: [name] Date of Service: 01/08/2015 1:00 PM Medical Record Number: [id number] Patient Account Number: [id number] Date of Birth/Sex: Nov 26, 1960 (54 y.o. Male) Treating RN: Ahmed Prima Primary Care Physician: Sena Hitch Other Clinician: Referring Physician: Sena Hitch Treating Physician/Extender: Frann Rider in Treatment: 9 Active Problems ICD-10 Encounter Code Description Active Date Diagnosis [phone] Pressure ulcer of left hip, stage 3 11/05/2014 Yes C90.00 Multiple myeloma not  having achieved remission 11/05/2014 Yes F17.218 Nicotine dependence, cigarettes, with other nicotine- 11/05/2014 Yes induced disorders E44.1 Mild protein-calorie malnutrition 11/05/2014 Yes S61.210A Laceration without foreign body of right index finger 01/01/2015 Yes without damage to nail, initial encounter Inactive Problems Resolved Problems Electronic Signature(s) Signed: 01/08/2015 1:49:18 PM By: Christin Fudge MD, FACS Entered By: Christin Fudge on 01/08/2015 13:49:18 Longhi, [name] ([id number]) -------------------------------------------------------------------------------- Progress Note Details Patient Name: [name] Date of Service: 01/08/2015 1:00 PM Medical Record Number: [id number] Patient Account Number: [id number] Date of Birth/Sex: 05-08-1960 (54 y.o. Male) Treating RN: Ahmed Prima Primary Care Physician: Sena Hitch Other Clinician: Referring Physician: Sena Hitch Treating Physician/Extender: Frann Rider in Treatment: 9 Subjective Chief Complaint Information obtained from Patient Patient is at the clinic for treatment  of an open pressure ulcer. 54 year old gentleman with a ulcerated area on his left hip she's had for 3 weeks. History of Present Illness (HPI) The following HPI elements were documented for the patient's wound: Location: left hip area Quality: Patient reports experiencing a dull pain to affected area(s). Severity: Patient states wound are getting worse. Duration: Patient has had the wound for < 3 weeks prior to presenting for treatment Timing: Pain in wound is Intermittent (comes and goes Context: The wound appeared gradually over time Modifying Factors: Other treatment(s) tried include: local dressing changes with antibiotic cream Associated Signs and Symptoms: Patient reports having difficulty standing for long periods. The patient is under treatment with medical oncology Dr. Ma Hillock sees him, for multiple myeloma,  anemia, COPD, peripheral neuropathy, pedal edema and has been referred to [place] for left lateral hip pressure ulcer. his past medical history significant for coronary artery disease, dilated cardiomyopathy with an EF of 20% and has had her AICD placement, kidney stones, asthma, COPD, polysubstance abuse, anxiety. continues to smoke a pack of cigarettes a day, history of recreational drug usage and used to take IV drugs in the late 1980s. He was in bed for a long while due to an associated illness and lay on his left side for prolonged periods of time. He has been ambulatory otherwise and has a fairly poor nutrition. 01/01/2015 -- I have reviewed the notes whether the patient was recently admitted to the hospital between November 30 and 12/27/2014. He was admitted for cellulitis of the left second finger, COPD exacerbation and hypotension and was found that no surgical debridement was required after a surgical consult by Dr. Adonis Huguenin. he was treated with injectable vancomycin and Zosyn initially and then sent home on oral Augmentin. Objective Demaree, Yaroslav A. ([id number]) Constitutional Pulse regular. Respirations normal and unlabored. Afebrile. Vitals Time Taken: 1:08 PM, Height: 73 in, Weight: 132 lbs, BMI: 17.4, Temperature: 97.7 F, Pulse: 84 bpm, Respiratory Rate: 18 breaths/min, Blood Pressure: 130/79 mmHg. Eyes Nonicteric. Reactive to light. Ears, Nose, Mouth, and Throat Lips, teeth, and gums WNL.Marland Kitchen Moist mucosa without lesions . Neck supple and nontender. No palpable supraclavicular or cervical adenopathy. Normal sized without goiter. Respiratory WNL. No retractions.. Cardiovascular Pedal Pulses WNL. No clubbing, cyanosis or edema. Lymphatic No adneopathy. No adenopathy. No adenopathy. Musculoskeletal Adexa without tenderness or enlargement.. Digits and nails w/o clubbing, cyanosis, infection, petechiae, ischemia, or inflammatory conditions.Marland Kitchen Psychiatric Judgement and insight  Intact.. No evidence of depression, anxiety, or agitation.. General Notes: the left hip is looking better and is minimal debris and we will continue with Santyl ointment. The right index finger is looking much better has dried out nicely and there is some eschar and dry skin but overall is looking excellent. Integumentary (Hair, Skin) No suspicious lesions. No crepitus or fluctuance. No peri-wound warmth or erythema. No masses.. Wound #1 status is Open. Original cause of wound was Gradually Appeared. The wound is located on the Left Ischial Tuberosity. The wound measures 1cm length x 1cm width x 0.1cm depth; 0.785cm^2 area and 0.079cm^3 volume. The wound is limited to skin breakdown. There is no tunneling noted. There is a small amount of serosanguineous drainage noted. The wound margin is distinct with the outline attached to the wound base. There is no granulation within the wound bed. There is a large (67-100%) amount of necrotic tissue within the wound bed including Eschar and Adherent Slough. The periwound skin appearance exhibited: Localized Edema, Moist, Hemosiderin Staining, Erythema. The periwound skin appearance did not  exhibit: Callus, Crepitus, Excoriation, Fluctuance, Friable, Induration, Rash, Scarring, Dry/Scaly, Maceration, Atrophie Blanche, Cyanosis, Ecchymosis, Mottled, Pallor, Rubor. The surrounding wound skin color is noted with erythema which is circumferential. Erythema is measured at 7 cm. Periwound Cournoyer, Ralphael A. ([id number]) temperature was noted as No Abnormality. The periwound has tenderness on palpation. Wound #2 status is Open. Original cause of wound was Gradually Appeared. The wound is located on the Left,Circumferential Hand - 2nd Digit. The wound measures 3cm length x 3cm width x 0.1cm depth; 7.069cm^2 area and 0.707cm^3 volume. The wound is limited to skin breakdown. There is no tunneling or undermining noted. There is a none present amount of drainage noted.  The wound margin is flat and intact. There is small (1-33%) pink granulation within the wound bed. There is a large (67-100%) amount of necrotic tissue within the wound bed including Adherent Slough. The periwound skin appearance exhibited: Dry/Scaly. The periwound skin appearance did not exhibit: Callus, Crepitus, Excoriation, Fluctuance, Friable, Induration, Localized Edema, Rash, Scarring, Maceration, Moist, Atrophie Blanche, Cyanosis, Ecchymosis, Hemosiderin Staining, Mottled, Pallor, Rubor, Erythema. Periwound temperature was noted as No Abnormality. Assessment Active Problems ICD-10 L89.223 - Pressure ulcer of left hip, stage 3 C90.00 - Multiple myeloma not having achieved remission F17.218 - Nicotine dependence, cigarettes, with other nicotine-induced disorders E44.1 - Mild protein-calorie malnutrition S61.210A - Laceration without foreign body of right index finger without damage to nail, initial encounter I have recommended: 1. Sorbact to be wrapped around his finger, and dressed with a Kerlix bandage 2. Santyl to be continued to his left hip. Because of the holiday we will see him back in 2 weeks' time. Plan Wound Cleansing: Wound #1 Left Ischial Tuberosity: Clean wound with Normal Saline. Wound #2 Left,Circumferential Hand - 2nd Digit: Clean wound with Normal Saline. Anesthetic: Wound #1 Left Ischial Tuberosity: Topical Lidocaine 4% cream applied to wound bed prior to debridement [place], Nas A. ([id number]) Wound #2 Left,Circumferential Hand - 2nd Digit: Topical Lidocaine 4% cream applied to wound bed prior to debridement Skin Barriers/Peri-Wound Care: Wound #1 Left Ischial Tuberosity: Barrier cream Primary Wound Dressing: Wound #1 Left Ischial Tuberosity: Santyl Ointment Wound #2 Left,Circumferential Hand - 2nd Digit: Other: - sorbact with hydrogel Secondary Dressing: Wound #1 Left Ischial Tuberosity: Boardered Foam Dressing Wound #2 Left,Circumferential Hand -  2nd Digit: Conform/Kerlix - stretch net Dressing Change Frequency: Wound #1 Left Ischial Tuberosity: Change dressing every day. Follow-up Appointments: Wound #1 Left Ischial Tuberosity: Return Appointment in 2 weeks. Wound #2 Left,Circumferential Hand - 2nd Digit: Return Appointment in 2 weeks. Off-Loading: Wound #1 Left Ischial Tuberosity: Turn and reposition every 2 hours Additional Orders / Instructions: Wound #1 Left Ischial Tuberosity: Stop Smoking Increase protein intake. Other: - obtain vitamins;zinc, vitamin C Wound #2 Left,Circumferential Hand - 2nd Digit: Stop Smoking Increase protein intake. Other: - obtain vitamins;zinc, vitamin C Home Health: Wound #1 Left Ischial Tuberosity: [place] Nurse may visit PRN to address patient s wound care needs. FACE TO FACE ENCOUNTER: MEDICARE and MEDICAID PATIENTS: I certify that this patient is under my care and that I had a face-to-face encounter that meets the physician face-to-face encounter requirements with this patient on this date. The encounter with the patient was in whole or in part for the following MEDICAL CONDITION: (primary reason for [place]) MEDICAL NECESSITY: I certify, that based on my findings, NURSING services are a medically necessary home health service. HOME BOUND STATUS: I certify that my clinical findings support that this patient  is homebound (i.e., Due to illness or injury, pt requires aid of supportive devices such as crutches, cane, wheelchairs, walkers, the use of special transportation or the assistance of another person to leave their place of residence. There is a normal inability to leave the home and doing so requires considerable and taxing effort. Other absences are for medical reasons / religious services and are infrequent or of short duration when for other reasons). If current dressing causes regression in wound condition, may D/C ordered  dressing product/s and apply Normal Saline Moist Dressing daily until next [place] / Other MD appointment. Notify Wound Siemon, Avory A. ([id number]) [facility] of regression in wound condition at [phone]. Please direct any NON-WOUND related issues/requests for orders to patient's Primary Care Physician Wound #2 Left,Circumferential Hand - 2nd Digit: [place] Visits - [facility] Please order the pt some [place] Nurse may visit PRN to address patient s wound care needs. FACE TO FACE ENCOUNTER: MEDICARE and MEDICAID PATIENTS: I certify that this patient is under my care and that I had a face-to-face encounter that meets the physician face-to-face encounter requirements with this patient on this date. The encounter with the patient was in whole or in part for the following MEDICAL CONDITION: (primary reason for [place]) MEDICAL NECESSITY: I certify, that based on my findings, NURSING services are a medically necessary home health service. HOME BOUND STATUS: I certify that my clinical findings support that this patient is homebound (i.e., Due to illness or injury, pt requires aid of supportive devices such as crutches, cane, wheelchairs, walkers, the use of special transportation or the assistance of another person to leave their place of residence. There is a normal inability to leave the home and doing so requires considerable and taxing effort. Other absences are for medical reasons / religious services and are infrequent or of short duration when for other reasons). If current dressing causes regression in wound condition, may D/C ordered dressing product/s and apply Normal Saline Moist Dressing daily until next [place] / Other MD appointment. [place] of regression in wound condition at [phone]. Please direct any NON-WOUND related issues/requests for orders to patient's Primary Care Physician Oxygen  Administration: Wound #1 Left Ischial Tuberosity: Pulse Oxymetry measurement in clinic - 98 While patient is in clinic, provide supplimental oxygen via nasal cannula at liters/min __ : - 3L Wound #2 Left,Circumferential Hand - 2nd Digit: Pulse Oxymetry measurement in clinic - 98 While patient is in clinic, provide supplimental oxygen via nasal cannula at liters/min __ : - 3L I have recommended: 1. Sorbact to be wrapped around his finger, and dressed with a Kerlix bandage 2. Santyl to be continued to his left hip. Because of the holiday we will see him back in 2 weeks' time. Electronic Signature(s) Signed: 01/12/2015 4:10:14 PM By: Christin Fudge MD, FACS Previous Signature: 01/08/2015 1:51:10 PM Version By: Christin Fudge MD, FACS Entered By: Christin Fudge on 01/12/2015 16:10:14 Kuri, [name] ([id number]) -------------------------------------------------------------------------------- SuperBill Details Patient Name: [name] Date of Service: 01/08/2015 Medical Record Number: [id number] Patient Account Number: [id number] Date of Birth/Sex: 1960-07-07 (54 y.o. Male) Treating RN: Ahmed Prima Primary Care Physician: Sena Hitch Other Clinician: Referring Physician: Sena Hitch Treating Physician/Extender: Frann Rider in Treatment: 9 Diagnosis Coding ICD-10 Codes Code Description [phone] Pressure ulcer of left hip, stage 3 C90.00 Multiple myeloma not having achieved remission F17.218 Nicotine dependence, cigarettes, with other nicotine-induced disorders E44.1 Mild protein-calorie malnutrition Laceration without  foreign body of right index finger without damage to nail, initial S61.210A encounter Facility Procedures CPT4 Code: [id number] Description: [id number] - WOUND CARE VISIT-LEV 2 EST PT Modifier: Quantity: 1 Physician Procedures CPT4: Description Modifier Quantity Code [id number] 16073 - WC PHYS LEVEL 3 - EST PT 1 ICD-10 Description Diagnosis 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

## 2015-01-14 NOTE — Progress Notes (Signed)
WELLS, MORING ([id number]) Visit Report for 01/08/2015 Arrival Information Details Patient Name: [name], [name] Date of Service: 01/08/2015 1:00 PM Medical Record Number: [id number] Patient Account Number: [id number] Date of Birth/Sex: 1960/09/10 (54 y.o. Male) Treating RN: [name] Primary Care Physician: [name] Other Clinician: Referring Physician: Sena [name] Treating Physician/Extender: [name] in Treatment: 9 Visit Information History Since Last Visit All ordered tests and consults were completed: No Patient Arrived: Ambulatory Added or deleted any medications: No Arrival Time: 13:07 Any new allergies or adverse reactions: No Accompanied By: wife Had a fall or experienced change in Yes Transfer Assistance: None activities of daily living that may affect Patient Identification Verified: Yes risk of falls: Secondary Verification Process Yes Signs or symptoms of abuse/neglect since last No Completed: visito Patient Requires Transmission-Based No Hospitalized since last visit: No Precautions: Pain Present Now: No Patient Has Alerts: No Electronic Signature(s) Signed: 01/13/2015 4:19:08 PM By: [name] Entered By: [name] on 01/08/2015 13:08:28 [name] -------------------------------------------------------------------------------- Clinic Level of Care Assessment Details Patient Name: [name] Date of Service: 01/08/2015 1:00 PM Medical Record Number: [id number] Patient Account Number: [id number] Date of Birth/Sex: 12/14/1960 (54 y.o. Male) Treating RN: [name] Primary Care Physician: [name] Other Clinician: Referring Physician: Sena [name] Treating Physician/Extender: [name] in Treatment: 9 Clinic Level of Care Assessment Items TOOL 4 Quantity Score []  - Use when only an EandM is performed on FOLLOW-UP visit 0 ASSESSMENTS - Nursing Assessment /  Reassessment []  - Reassessment of Co-morbidities (includes updates in patient status) 0 X - Reassessment of Adherence to Treatment Plan 1 5 ASSESSMENTS - Wound and Skin Assessment / Reassessment []  - Simple Wound Assessment / Reassessment - one wound 0 X - Complex Wound Assessment / Reassessment - multiple wounds 1 5 []  - Dermatologic / Skin Assessment (not related to wound area) 0 ASSESSMENTS - Focused Assessment []  - Circumferential Edema Measurements - multi extremities 0 []  - Nutritional Assessment / Counseling / Intervention 0 []  - Lower Extremity Assessment (monofilament, tuning fork, pulses) 0 []  - Peripheral Arterial Disease Assessment (using hand held doppler) 0 ASSESSMENTS - Ostomy and/or Continence Assessment and Care []  - Incontinence Assessment and Management 0 []  - Ostomy Care Assessment and Management (repouching, etc.) 0 PROCESS - Coordination of Care X - Simple Patient / Family Education for ongoing care 1 15 []  - Complex (extensive) Patient / Family Education for ongoing care 0 []  - Staff obtains Programmer, systems, Records, Test Results / Process Orders 0 []  - Staff telephones HHA, Nursing Homes / Clarify orders / etc 0 []  - Routine Transfer to another Facility (non-emergent condition) 0 [name], [name] ([id number]) []  - Routine Hospital Admission (non-emergent condition) 0 []  - New Admissions / Biomedical engineer / Ordering NPWT, Apligraf, etc. 0 []  - Emergency Hospital Admission (emergent condition) 0 X - Simple Discharge Coordination 1 10 []  - Complex (extensive) Discharge Coordination 0 PROCESS - Special Needs []  - Pediatric / Minor Patient Management 0 []  - Isolation Patient Management 0 []  - Hearing / Language / Visual special needs 0 []  - Assessment of Community assistance (transportation, D/C planning, etc.) 0 []  - Additional assistance / Altered mentation 0 []  - Support Surface(s) Assessment (bed, cushion, seat, etc.) 0 INTERVENTIONS - Wound Cleansing /  Measurement []  - Simple Wound Cleansing - one wound 0 X - Complex Wound Cleansing - multiple wounds 1 5 X - Wound Imaging (photographs - any number of wounds) 1 5 []  - Wound  Tracing (instead of photographs) 0 []  - Simple Wound Measurement - one wound 0 X - Complex Wound Measurement - multiple wounds 1 5 INTERVENTIONS - Wound Dressings []  - Small Wound Dressing one or multiple wounds 0 X - Medium Wound Dressing one or multiple wounds 1 15 []  - Large Wound Dressing one or multiple wounds 0 X - Application of Medications - topical 1 5 []  - Application of Medications - injection 0 INTERVENTIONS - Miscellaneous []  - External ear exam 0 [name], [name] ([id number]) []  - Specimen Collection (cultures, biopsies, blood, body fluids, etc.) 0 []  - Specimen(s) / Culture(s) sent or taken to Lab for analysis 0 []  - Patient Transfer (multiple staff / Harrel Lemon Lift / Similar devices) 0 []  - Simple Staple / Suture removal (25 or less) 0 []  - Complex Staple / Suture removal (26 or more) 0 []  - Hypo / Hyperglycemic Management (close monitor of Blood Glucose) 0 []  - Ankle / Brachial Index (ABI) - do not check if billed separately 0 X - Vital Signs 1 5 Has the patient been seen at the hospital within the last three years: Yes Total Score: 75 Level Of Care: New/Established - Level 2 Electronic Signature(s) Signed: 01/13/2015 4:19:08 PM By: [name] Entered By: [name] on 01/08/2015 16:45:32 [name] -------------------------------------------------------------------------------- Encounter Discharge Information Details Patient Name: [name] Date of Service: 01/08/2015 1:00 PM Medical Record Number: [id number] Patient Account Number: [id number] Date of Birth/Sex: 06-13-1960 (54 y.o. Male) Treating RN: [name] Primary Care Physician: [name] Other Clinician: Referring Physician: Sena [name] Treating Physician/Extender: [name] in Treatment: 9 Encounter Discharge Information Items Discharge Pain Level: 0 Discharge Condition: Stable Ambulatory Status: Ambulatory Discharge Destination: Home Transportation: Private Auto Accompanied By: wife Schedule Follow-up Appointment: Yes Medication Reconciliation completed and provided to Patient/Care Yes Gabi Mcfate: Provided on Clinical Summary of Care: 01/08/2015 Form Type Recipient Paper Patient RM Electronic Signature(s) Signed: 01/08/2015 1:54:50 PM By: Ruthine Dose Entered By: Ruthine Dose on 01/08/2015 13:54:50 Hegarty, Pearletha [name] ([id number]) -------------------------------------------------------------------------------- Lower Extremity Assessment Details Patient Name: [name] Date of Service: 01/08/2015 1:00 PM Medical Record Number: [id number] Patient Account Number: [id number] Date of Birth/Sex: September 15, 1960 (54 y.o. Male) Treating RN: [name] Primary Care Physician: [name] Other Clinician: Referring Physician: Sena [name] Treating Physician/Extender: [name] in Treatment: 9 Electronic Signature(s) Signed: 01/13/2015 4:19:08 PM By: [name] Entered By: [name] on 01/08/2015 13:11:35 Minium, Jud AMarland Kitchen ([id number]) -------------------------------------------------------------------------------- Multi Wound Chart Details Patient Name: [name] Date of Service: 01/08/2015 1:00 PM Medical Record Number: [id number] Patient Account Number: [id number] Date of Birth/Sex: Jun 02, 1960 (54 y.o. Male) Treating RN: [name] Primary Care Physician: [name] Other Clinician: Referring Physician: Sena [name] Treating Physician/Extender: [name] in Treatment: 9 Vital Signs Height(in): 73 Pulse(bpm): 84 Weight(lbs): 132 Blood Pressure 130/79 (mmHg): Body Mass Index(BMI): 17 Temperature(F): 97.7 Respiratory Rate 18 (breaths/min): Photos: [1:No  Photos] [2:No Photos] [N/A:N/A] Wound Location: [1:Left Ischial Tuberosity] [2:Left Hand - 2nd Digit - Circumfernential] [N/A:N/A] Wounding Event: [1:Gradually Appeared] [2:Gradually Appeared] [N/A:N/A] Primary Etiology: [1:Malignant Wound] [2:Cellulitis] [N/A:N/A] Comorbid History: [1:Chronic Obstructive Pulmonary Disease (COPD), Arrhythmia, Hypertension, Received Radiation] [2:Chronic Obstructive Pulmonary Disease (COPD), Arrhythmia, Hypertension, Received Radiation] [N/A:N/A] Date Acquired: [1:10/19/2014] [2:12/24/2014] [N/A:N/A] Weeks of Treatment: [1:9] [2:1] [N/A:N/A] Wound Status: [1:Open] [2:Open] [N/A:N/A] Measurements L x W x D 1x1x0.1 [2:3x3x0.1] [N/A:N/A] (cm) Area (cm) : [1:0.785] [id number] [N/A:N/A] Volume (cm) : [1:0.079] [2:0.707] [N/A:N/A] % Reduction in Area: [1:77.30%] [2:78.80%] [N/A:N/A] % Reduction  in Volume: 77.20% [2:78.80%] [N/A:N/A] Classification: [1:Partial Thickness] [2:Partial Thickness] [N/A:N/A] Exudate Amount: [1:Small] [2:None Present] [N/A:N/A] Exudate Type: [1:Serosanguineous] [2:N/A] [N/A:N/A] Exudate Color: [1:red, brown] [2:N/A] [N/A:N/A] Wound Margin: [1:Distinct, outline attached] [2:Flat and Intact] [N/A:N/A] Granulation Amount: [1:None Present (0%)] [2:Small (1-33%)] [N/A:N/A] Granulation Quality: [1:N/A] [2:Pink] [N/A:N/A] Necrotic Amount: [1:Large (67-100%)] [2:Large (67-100%)] [N/A:N/A] Necrotic Tissue: [1:Eschar, Adherent Slough] [2:Adherent Slough] [N/A:N/A] Exposed Structures: [1:Fascia: No Fat: No] [2:Fascia: No Fat: No] [N/A:N/A] Tendon: No Tendon: No Muscle: No Muscle: No Joint: No Joint: No Bone: No Bone: No Limited to Skin Limited to Skin Breakdown Breakdown Epithelialization: None Small (1-33%) N/A Periwound Skin Texture: Edema: Yes Edema: No N/A Excoriation: No Excoriation: No Induration: No Induration: No Callus: No Callus: No Crepitus: No Crepitus: No Fluctuance: No Fluctuance: No Friable: No Friable:  No Rash: No Rash: No Scarring: No Scarring: No Periwound Skin Moist: Yes Dry/Scaly: Yes N/A Moisture: Maceration: No Maceration: No Dry/Scaly: No Moist: No Periwound Skin Color: Erythema: Yes Atrophie Blanche: No N/A Hemosiderin Staining: Yes Cyanosis: No Atrophie Blanche: No Ecchymosis: No Cyanosis: No Erythema: No Ecchymosis: No Hemosiderin Staining: No Mottled: No Mottled: No Pallor: No Pallor: No Rubor: No Rubor: No Erythema Location: Circumferential N/A N/A Erythema Measurement: Measured: 7cm N/A N/A Temperature: No Abnormality No Abnormality N/A Tenderness on Yes No N/A Palpation: Wound Preparation: Ulcer Cleansing: Ulcer Cleansing: N/A Rinsed/Irrigated with Rinsed/Irrigated with Saline Saline Topical Anesthetic Topical Anesthetic Applied: Other: lidocaine Applied: Other: lidocaine 4% 4% Treatment Notes Electronic Signature(s) Signed: 01/13/2015 4:19:08 PM By: [name] Entered By: [name] on 01/08/2015 13:22:01 Peplinski, Pearletha [name] ([id number]) -------------------------------------------------------------------------------- [place] Details Patient Name: [name] Date of Service: 01/08/2015 1:00 PM Medical Record Number: [id number] Patient Account Number: [id number] Date of Birth/Sex: 10-06-1960 (54 y.o. Male) Treating RN: [name] Primary Care Physician: [name] Other Clinician: Referring Physician: Sena [name] Treating Physician/Extender: [name] in Treatment: 9 Active Inactive Abuse / Safety / Falls / Self Care Management Nursing Diagnoses: Potential for falls Goals: Patient will remain injury free Date Initiated: 01/08/2015 Goal Status: Active Patient/caregiver will verbalize/demonstrate measures taken to improve the patient's personal safety Date Initiated: 01/08/2015 Goal Status: Active Interventions: Assess fall risk on admission and as needed Notes: Orientation  to the Wound Care Program Nursing Diagnoses: Knowledge deficit related to the wound healing center program Goals: Patient/caregiver will verbalize understanding of the [facility] [place] Program Date Initiated: 11/19/2014 Goal Status: Active Interventions: Provide education on orientation to the wound center Notes: Pressure Nursing Diagnoses: Knowledge deficit related to causes and risk factors for pressure ulcer development Pongratz, Taariq A. ([id number]) Goals: Patient will remain free of pressure ulcers Date Initiated: 11/19/2014 Goal Status: Active Interventions: Assess: immobility, friction, shearing, incontinence upon admission and as needed Treatment Activities: Patient referred for seating evaluation to ensure proper offloading : 01/08/2015 Notes: Wound/Skin Impairment Nursing Diagnoses: Impaired tissue integrity Goals: Ulcer/skin breakdown will heal within 14 weeks Date Initiated: 11/19/2014 Goal Status: Active Interventions: Assess ulceration(s) every visit Treatment Activities: Topical wound management initiated : 01/08/2015 Notes: Electronic Signature(s) Signed: 01/13/2015 4:19:08 PM By: [name] Entered By: [name] on 01/08/2015 13:38:29 Bearman, Pearletha [name] ([id number]) -------------------------------------------------------------------------------- Pain Assessment Details Patient Name: [name] Date of Service: 01/08/2015 1:00 PM Medical Record Number: [id number] Patient Account Number: [id number] Date of Birth/Sex: 1960-10-06 (54 y.o. Male) Treating RN: [name] Primary Care Physician: [name] Other Clinician: Referring Physician: Sena [name] Treating Physician/Extender: [name] in Treatment: 9 Active Problems Location of Pain Severity and Description  of Pain Patient Has Paino No Site Locations Pain Management and Medication Current Pain Management: Electronic Signature(s) Signed: 01/13/2015  4:19:08 PM By: [name] Entered By: [name] on 01/08/2015 13:08:32 Delancey, Pearletha [name] ([id number]) -------------------------------------------------------------------------------- Patient/Caregiver Education Details Patient Name: [name] Date of Service: 01/08/2015 1:00 PM Medical Record Number: [id number] Patient Account Number: [id number] Date of Birth/Gender: [birth year] (54 y.o. Male) Treating RN: [name] Primary Care Physician: [name] Other Clinician: Referring Physician: Sena [name] Treating Physician/Extender: [name] in Treatment: 9 Education Assessment Education Provided To: Patient and Caregiver Education Topics Provided Wound/Skin Impairment: Handouts: Other: change dressing as ordered Methods: Demonstration, Explain/Verbal Responses: State content correctly Electronic Signature(s) Signed: 01/13/2015 4:19:08 PM By: [name] Entered By: [name] on 01/08/2015 13:37:40 Moorehouse, Pearletha [name] ([id number]) -------------------------------------------------------------------------------- Wound Assessment Details Patient Name: [name] Date of Service: 01/08/2015 1:00 PM Medical Record Number: [id number] Patient Account Number: [id number] Date of Birth/Sex: Oct 20, 1960 (54 y.o. Male) Treating RN: [name] Primary Care Physician: [name] Other Clinician: Referring Physician: Sena [name] Treating Physician/Extender: [name] in Treatment: 9 Wound Status Wound Number: 1 Primary Malignant Wound Etiology: Wound Location: Left Ischial Tuberosity Wound Open Wounding Event: Gradually Appeared Status: Date Acquired: 10/19/2014 Comorbid Chronic Obstructive Pulmonary Weeks Of Treatment: 9 History: Disease (COPD), Arrhythmia, Clustered Wound: No Hypertension, Received Radiation Photos Photo Uploaded By: [name] on 01/08/2015 16:58:44 Wound Measurements Length:  (cm) 1 Width: (cm) 1 Depth: (cm) 0.1 Area: (cm) 0.785 Volume: (cm) 0.079 % Reduction in Area: 77.3% % Reduction in Volume: 77.2% Epithelialization: None Tunneling: No Wound Description Classification: Partial Thickness Wound Margin: Distinct, outline attached Exudate Amount: Small Exudate Type: Serosanguineous Exudate Color: red, brown Foul Odor After Cleansing: No Wound Bed Granulation Amount: None Present (0%) Exposed Structure Necrotic Amount: Large (67-100%) Fascia Exposed: No Necrotic Quality: Eschar, Adherent Slough Fat Layer Exposed: No Tendon Exposed: No Celestine, Samual A. ([id number]) Muscle Exposed: No Joint Exposed: No Bone Exposed: No Limited to Skin Breakdown Periwound Skin Texture Texture Color No Abnormalities Noted: No No Abnormalities Noted: No Callus: No Atrophie Blanche: No Crepitus: No Cyanosis: No Excoriation: No Ecchymosis: No Fluctuance: No Erythema: Yes Friable: No Erythema Location: Circumferential Induration: No Erythema Measurement: Measured Localized Edema: Yes 7 cm Rash: No Hemosiderin Staining: Yes Scarring: No Mottled: No Pallor: No Moisture Rubor: No No Abnormalities Noted: No Dry / Scaly: No Temperature / Pain Maceration: No Temperature: No Abnormality Moist: Yes Tenderness on Palpation: Yes Wound Preparation Ulcer Cleansing: Rinsed/Irrigated with Saline Topical Anesthetic Applied: Other: lidocaine 4%, Treatment Notes Wound #1 (Left Ischial Tuberosity) 1. Cleansed with: Clean wound with Normal Saline 2. Anesthetic Topical Lidocaine 4% cream to wound bed prior to debridement 4. Dressing Applied: Santyl Ointment Other dressing (specify in notes) 5. Secondary Dressing Applied ABD Pad Gauze and Kerlix/Conform Notes sorbact Electronic Signature(s) Signed: 01/13/2015 4:19:08 PM By: [name] Entered By: [name] on 01/08/2015 13:19:39 Wickes, Pearletha [name] ([id number]) Giammarino, Pearletha [name]  ([id number]) -------------------------------------------------------------------------------- Wound Assessment Details Patient Name: [name] Date of Service: 01/08/2015 1:00 PM Medical Record Number: [id number] Patient Account Number: [id number] Date of Birth/Sex: Sep 28, 1960 (54 y.o. Male) Treating RN: [name] Primary Care Physician: [name] Other Clinician: Referring Physician: Sena [name] Treating Physician/Extender: [name] in Treatment: 9 Wound Status Wound Number: 2 Primary Cellulitis Etiology: Wound Location: Left Hand - 2nd Digit - Circumfernential Wound Open Status: Wounding Event: Gradually Appeared Comorbid Chronic Obstructive Pulmonary Date Acquired: 12/24/2014 History: Disease (COPD), Arrhythmia, Weeks Of Treatment: 1  Hypertension, Received Radiation Clustered Wound: No Photos Photo Uploaded By: [name] on 01/08/2015 16:58:44 Wound Measurements Length: (cm) 3 Width: (cm) 3 Depth: (cm) 0.1 Area: (cm) 7.069 Volume: (cm) 0.707 % Reduction in Area: 78.8% % Reduction in Volume: 78.8% Epithelialization: Small (1-33%) Tunneling: No Undermining: No Wound Description Classification: Partial Thickness Foul Odor Aft Wound Margin: Flat and Intact Exudate Amount: None Present er Cleansing: No Wound Bed Granulation Amount: Small (1-33%) Exposed Structure Granulation Quality: Pink Fascia Exposed: No Necrotic Amount: Large (67-100%) Fat Layer Exposed: No Necrotic Quality: Adherent Slough Tendon Exposed: No Muscle Exposed: No Suddreth, Yuta A. ([id number]) Joint Exposed: No Bone Exposed: No Limited to Skin Breakdown Periwound Skin Texture Texture Color No Abnormalities Noted: No No Abnormalities Noted: No Callus: No Atrophie Blanche: No Crepitus: No Cyanosis: No Excoriation: No Ecchymosis: No Fluctuance: No Erythema: No Friable: No Hemosiderin Staining: No Induration: No Mottled: No Localized Edema:  No Pallor: No Rash: No Rubor: No Scarring: No Temperature / Pain Moisture Temperature: No Abnormality No Abnormalities Noted: No Dry / Scaly: Yes Maceration: No Moist: No Wound Preparation Ulcer Cleansing: Rinsed/Irrigated with Saline Topical Anesthetic Applied: Other: lidocaine 4%, Treatment Notes Wound #2 (Left, Circumferential Hand - 2nd Digit) 1. Cleansed with: Clean wound with Normal Saline 2. Anesthetic Topical Lidocaine 4% cream to wound bed prior to debridement 4. Dressing Applied: Santyl Ointment Other dressing (specify in notes) 5. Secondary Dressing Applied ABD Pad Gauze and Kerlix/Conform Notes sorbact Electronic Signature(s) Signed: 01/13/2015 4:19:08 PM By: [name] Entered By: [name] on 01/08/2015 13:15:33 Lex, Pearletha [name] ([id number]) -------------------------------------------------------------------------------- Vitals Details Patient Name: [name] Date of Service: 01/08/2015 1:00 PM Medical Record Number: [id number] Patient Account Number: [id number] Date of Birth/Sex: 18-Aug-1960 (54 y.o. Male) Treating RN: [name] Primary Care Physician: [name] Other Clinician: Referring Physician: Sena [name] Treating Physician/Extender: [name] in Treatment: 9 Vital Signs Time Taken: 13:08 Temperature (F): 97.7 Height (in): 73 Pulse (bpm): 84 Weight (lbs): 132 Respiratory Rate (breaths/min): 18 Body Mass Index (BMI): 17.4 Blood Pressure (mmHg): 130/79 Reference Range: 80 - 120 mg / dl Electronic Signature(s) Signed: 01/13/2015 4:19:08 PM By: [name] Entered By: [name] on 01/08/2015 13:10:22

## 2015-01-15 ENCOUNTER — Other Ambulatory Visit: Payer: Self-pay | Admitting: Internal Medicine

## 2015-01-15 DIAGNOSIS — J449 Chronic obstructive pulmonary disease, unspecified: Secondary | ICD-10-CM

## 2015-01-15 DIAGNOSIS — F419 Anxiety disorder, unspecified: Secondary | ICD-10-CM

## 2015-01-15 DIAGNOSIS — C9000 Multiple myeloma not having achieved remission: Secondary | ICD-10-CM

## 2015-01-15 MED ORDER — ALPRAZOLAM 0.5 MG PO TABS: 0.5000 mg | ORAL_TABLET | Freq: Three times a day (TID) | ORAL | Status: DC | PRN

## 2015-01-19 ENCOUNTER — Ambulatory Visit
Admission: RE | Admit: 2015-01-19 | Discharge: 2015-01-19 | Disposition: A | Discharge status: Home / Self Care | Payer: Medicaid Other | Source: Ambulatory Visit | Attending: Internal Medicine | Admitting: Internal Medicine

## 2015-01-19 ENCOUNTER — Telehealth: Payer: Self-pay | Admitting: *Deleted

## 2015-01-19 DIAGNOSIS — J439 Emphysema, unspecified: Secondary | ICD-10-CM | POA: Diagnosis not present

## 2015-01-19 DIAGNOSIS — J449 Chronic obstructive pulmonary disease, unspecified: Secondary | ICD-10-CM | POA: Insufficient documentation

## 2015-01-19 DIAGNOSIS — C9000 Multiple myeloma not having achieved remission: Secondary | ICD-10-CM | POA: Diagnosis present

## 2015-01-19 DIAGNOSIS — F419 Anxiety disorder, unspecified: Secondary | ICD-10-CM | POA: Diagnosis present

## 2015-01-19 DIAGNOSIS — M8588 Other specified disorders of bone density and structure, other site: Secondary | ICD-10-CM | POA: Diagnosis not present

## 2015-01-19 NOTE — Telephone Encounter (Signed)
Lasix 20 mg was given #10 on 12/14 for lower extremity edema

## 2015-01-20 ENCOUNTER — Inpatient Hospital Stay: Payer: Medicaid Other

## 2015-01-20 ENCOUNTER — Inpatient Hospital Stay (HOSPITAL_BASED_OUTPATIENT_CLINIC_OR_DEPARTMENT_OTHER): Payer: Medicaid Other | Admitting: Internal Medicine

## 2015-01-20 ENCOUNTER — Encounter: Payer: Self-pay | Admitting: Internal Medicine

## 2015-01-20 VITALS — BP 95/65 | HR 84 | Temp 97.6°F | Resp 18 | Ht 73.0 in | Wt 135.1 lb

## 2015-01-20 DIAGNOSIS — C9000 Multiple myeloma not having achieved remission: Secondary | ICD-10-CM

## 2015-01-20 DIAGNOSIS — Z9221 Personal history of antineoplastic chemotherapy: Secondary | ICD-10-CM

## 2015-01-20 DIAGNOSIS — R509 Fever, unspecified: Secondary | ICD-10-CM

## 2015-01-20 DIAGNOSIS — M791 Myalgia: Secondary | ICD-10-CM

## 2015-01-20 DIAGNOSIS — J449 Chronic obstructive pulmonary disease, unspecified: Secondary | ICD-10-CM

## 2015-01-20 DIAGNOSIS — J189 Pneumonia, unspecified organism: Secondary | ICD-10-CM

## 2015-01-20 DIAGNOSIS — F17210 Nicotine dependence, cigarettes, uncomplicated: Secondary | ICD-10-CM

## 2015-01-20 DIAGNOSIS — G8929 Other chronic pain: Secondary | ICD-10-CM

## 2015-01-20 DIAGNOSIS — F419 Anxiety disorder, unspecified: Secondary | ICD-10-CM

## 2015-01-20 DIAGNOSIS — L03012 Cellulitis of left finger: Secondary | ICD-10-CM

## 2015-01-20 DIAGNOSIS — L089 Local infection of the skin and subcutaneous tissue, unspecified: Secondary | ICD-10-CM

## 2015-01-20 DIAGNOSIS — R600 Localized edema: Secondary | ICD-10-CM

## 2015-01-20 LAB — BASIC METABOLIC PANEL
Anion gap: 8 (ref 5–15)
BUN: 11 mg/dL (ref 6–20)
CHLORIDE: 100 mmol/L — AB (ref 101–111)
CO2: 28 mmol/L (ref 22–32)
CREATININE: 0.88 mg/dL (ref 0.61–1.24)
Calcium: 9 mg/dL (ref 8.9–10.3)
GFR calc Af Amer: >60 mL/min (ref >60)
GFR calc non Af Amer: >60 mL/min (ref >60)
GLUCOSE: 118 mg/dL — AB (ref 65–99)
POTASSIUM: 4.1 mmol/L (ref 3.5–5.1)
Sodium: 136 mmol/L (ref 135–145)

## 2015-01-20 LAB — CBC WITH DIFFERENTIAL/PLATELET
Basophils Absolute: 0.1 10*3/uL (ref 0–0.1)
Basophils Relative: 1 %
EOS ABS: 0.2 10*3/uL (ref 0–0.7)
EOS PCT: 4 %
HCT: 37.4 % — ABNORMAL LOW (ref 40.0–52.0)
HEMOGLOBIN: 12.2 g/dL — AB (ref 13.0–18.0)
LYMPHS ABS: 1.3 10*3/uL (ref 1.0–3.6)
LYMPHS PCT: 22 %
MCH: 31 pg (ref 26.0–34.0)
MCHC: 32.5 g/dL (ref 32.0–36.0)
MCV: 95.2 fL (ref 80.0–100.0)
MONOS PCT: 12 %
Monocytes Absolute: 0.7 10*3/uL (ref 0.2–1.0)
NEUTROS PCT: 61 %
Neutro Abs: 3.6 10*3/uL (ref 1.4–6.5)
Platelets: 314 10*3/uL (ref 150–440)
RBC: 3.93 MIL/uL — AB (ref 4.40–5.90)
RDW: 17.5 % — ABNORMAL HIGH (ref 11.5–14.5)
WBC: 6 10*3/uL (ref 3.8–10.6)

## 2015-01-20 MED ORDER — SODIUM CHLORIDE 0.9 % IV SOLN
INTRAVENOUS | Status: DC
  Administered 2015-01-20: 15:00:00 via INTRAVENOUS
  Filled 2015-01-20: qty 1000

## 2015-01-20 MED ORDER — ZOLEDRONIC ACID 4 MG/5ML IV CONC: 3.0000 mg | Freq: Once | INTRAVENOUS | Status: DC

## 2015-01-20 MED ORDER — AMOXICILLIN-POT CLAVULANATE 875-125 MG PO TABS: 1.0000 | ORAL_TABLET | Freq: Two times a day (BID) | ORAL | Status: DC

## 2015-01-20 MED ORDER — FENTANYL 25 MCG/HR TD PT72: 25.0000 ug | MEDICATED_PATCH | TRANSDERMAL | Status: DC

## 2015-01-20 MED ORDER — FENTANYL 100 MCG/HR TD PT72: 100.0000 ug | MEDICATED_PATCH | TRANSDERMAL | Status: DC

## 2015-01-20 MED ORDER — ZOLEDRONIC ACID 4 MG/100ML IV SOLN
4.0000 mg | Freq: Once | INTRAVENOUS | Status: AC
  Administered 2015-01-20: 4 mg via INTRAVENOUS
  Filled 2015-01-20: qty 100

## 2015-01-20 MED ORDER — FENTANYL 12 MCG/HR TD PT72: 12.0000 ug | MEDICATED_PATCH | TRANSDERMAL | Status: DC

## 2015-01-20 NOTE — Progress Notes (Signed)
Pt feels weak, been running fevers.  Coughing up different colors of sputum. Drinking boost.

## 2015-01-20 NOTE — Progress Notes (Signed)
[facility] OFFICE PROGRESS NOTE  Patient Care Team: Petra Kuba, MD as PCP - General (Family Medicine)   SUMMARY OF ONCOLOGIC HISTORY:  # SEP 2015- MULTIPLE MYELOMA [multiple PET pos Bone lesions; hypercalcemia s/p BMBx; FISH- Aneuploidy - gain of chromosome 7,9,15,FGFR3/4p16.3, and CCND1/11q13. Loss of MAF/16q23.1), cytogenetics normal 46XY] s/p Vel-Dex-Rev-Zometa; Excellent PR   # MARCH 2016- REV-DEX   # Chronic pain  # COPD/smoking/ [[facility]- not candidate for BMT- sec to co-morbidities/poor nutritional status];     INTERVAL HISTORY:  [name] returns to our clinic for a follow-up visit. This is a 54 year old male patient with above history of multiple myeloma on maintenance Revlimid-dexamethasone [currently on hold] was recently evaluated in the hospital when he was admitted for bronchitis/COPD; also possible cellulitis of his left finger.  Patient continues to complain of  cough with small amount of white sputum. Denies any hemoptysis. He claims that he has had fevers as high as 10 23F. He had a CT scan of the chest yesterday.  No chest pain; overall continued aches and pains in his rest of his bones. He continues to be on fentanyl patch/Dilaudid pills.   His wound of the left finger is improving; and also the left lateral buttock wound is healing. He is going to wound care. Also complaining of swelling in the legs.    REVIEW OF SYSTEMS:  A complete 10 point review of system is done which is negative except mentioned above/history of present illness.   PAST MEDICAL HISTORY :  Past Medical History  Diagnosis Date  . Multiple myeloma ([place])   . Multiple myeloma ([place])   . Anxiety   . CHF (congestive heart failure) ([place])   . COPD (chronic obstructive pulmonary disease) ([place])   . GERD (gastroesophageal reflux disease)   . Substance abuse   . Myocardial infarction ([place])   . Coronary artery disease   . Atrial septal defect   . Hyperlipidemia   .  Dysrhythmias     PAST SURGICAL HISTORY :  No past surgical history on file.  FAMILY HISTORY :   Family History  Problem Relation Age of Onset  . COPD Mother   . CAD Mother   . Heart attack Father     SOCIAL HISTORY:   Social History  Substance Use Topics  . Smoking status: Current Every Day Smoker -- 0.50 packs/day for 40 years    Types: Cigarettes  . Smokeless tobacco: Not on file  . Alcohol Use: No    ALLERGIES:  has No Known Allergies.  MEDICATIONS:  Current Outpatient Prescriptions  Medication Sig Dispense Refill  . albuterol (PROVENTIL HFA;VENTOLIN HFA) 108 (90 BASE) MCG/ACT inhaler Inhale 2 puffs into the lungs every 6 (six) hours as needed for wheezing or shortness of breath.    . ALPRAZolam (XANAX) 0.5 MG tablet Take 1-2 tablets (0.5-1 mg total) by mouth 3 (three) times daily as needed for anxiety. 90 tablet 0  . amiodarone (PACERONE) 200 MG tablet Take 200 mg by mouth daily.    Marland Kitchen aspirin 325 MG EC tablet Take 325 mg by mouth daily.    Marland Kitchen atorvastatin (LIPITOR) 20 MG tablet Take 20 mg by mouth daily.    . benzonatate (TESSALON) 100 MG capsule Take by mouth 3 (three) times daily as needed for cough.    . carvedilol (COREG) 6.25 MG tablet Take 6.25 mg by mouth 2 (two) times daily.     . collagenase (SANTYL) ointment Apply topically daily. 15 g  0  . dexamethasone (DECADRON) 4 MG tablet Take 4 mg by mouth once a week. Pt takes on Friday.    . docusate sodium (COLACE) 100 MG capsule Take 100 mg by mouth 2 (two) times daily.    . fentaNYL (DURAGESIC - DOSED MCG/HR) 100 MCG/HR Place 1 patch (100 mcg total) onto the skin every other day. Pt uses in addition to a 66mg and 235m patch. 5 patch 0  . fentaNYL (DURAGESIC - DOSED MCG/HR) 12 MCG/HR Place 1 patch (12.5 mcg total) onto the skin every other day. Pt uses in addition to a 2547mand 100m39match. 5 patch 0  . fentaNYL (DURAGESIC - DOSED MCG/HR) 25 MCG/HR patch Place 1 patch (25 mcg total) onto the skin every other day. Pt  uses in addition to a 12mc8md 100mcg64mch. 5 patch 0  . Fluticasone-Salmeterol (ADVAIR) 100-50 MCG/DOSE AEPB Inhale 1 puff into the lungs 2 (two) times daily.    . furosemide (LASIX) 20 MG tablet Take 1 tablet (20 mg total) by mouth daily. 10 tablet 0  . gabapentin (NEURONTIN) 100 MG capsule Take 1 capsule (100 mg total) by mouth 3 (three) times daily. 90 capsule 3  . HYDROmorphone (DILAUDID) 2 MG tablet Take 1-2 tablets (2-4 mg total) by mouth every 4 (four) hours as needed for severe pain. 130 tablet 0  . ibuprofen (ADVIL,MOTRIN) 200 MG tablet Take 400 mg by mouth every 6 (six) hours as needed for fever.    . ipraMarland Kitchenropium-albuterol (DUONEB) 0.5-2.5 (3) MG/3ML SOLN Take 3 mLs by nebulization every 4 (four) hours as needed. 360 mL 3  . lenalidomide (REVLIMID) 25 MG capsule Take 25 mg by mouth daily. Pt takes one capsule daily for 21 days, then 7 days off.    . lisinopril (PRINIVIL,ZESTRIL) 2.5 MG tablet Take 2.5 mg by mouth daily.    . polyethylene glycol (MIRALAX / GLYCOLAX) packet Take 17 g by mouth daily.    . promethazine (PHENERGAN) 25 MG tablet Take 1 tablet (25 mg total) by mouth every 4 (four) hours as needed for nausea or vomiting. 60 tablet 2  . silver sulfADIAZINE (SILVADENE) 1 % cream Apply topically daily. 50 g 0  . tiotropium (SPIRIVA) 18 MCG inhalation capsule Place 1 capsule (18 mcg total) into inhaler and inhale daily. 30 capsule 12   No current facility-administered medications for this visit.    PHYSICAL EXAMINATION: ECOG PERFORMANCE STATUS: 2 - Symptomatic, <50% confined to bed  BP 95/65 mmHg  Pulse 84  Temp(Src) 97.6 F (36.4 C) (Tympanic)  Resp 18  Ht 6' 1"  (1.854 m)  Wt 135 lb 2.3 oz (61.3 kg)  BMI 17.83 kg/m2  Filed Weights   01/20/15 1402  Weight: 135 lb 2.3 oz (61.3 kg)    GENERAL: Moderately nourished moderately CAUCASIAN male patient. Alert, no distress and comfortable.  Accompanied by family.  EYES: no pallor or icterus OROPHARYNX: no thrush or  ulceration; poor dentition. NECK: supple, no masses felt LYMPH:  no palpable lymphadenopathy in the cervical, axillary or inguinal regions LUNGS: With decreased bilateral breath sounds. No wheeze or crackles HEART/CVS: regular rate & rhythm and no murmurs; 1+ bilateral lower extremity edema. ABDOMEN:abdomen soft, non-tender and normal bowel sounds Musculoskeletal:no cyanosis of digits and no clubbing  PSYCH: alert & oriented x 3 with fluent speech NEURO: no focal motor/sensory deficits SKIN:  no rashes or significant lesions  LABORATORY DATA:  I have reviewed the data as listed    Component Value Date/Time  NA 138 01/06/2015 0943   NA 135 05/12/2014 1343   K 3.8 01/06/2015 0943   K 3.3* 05/12/2014 1343   CL 102 01/06/2015 0943   CL 101 05/12/2014 1343   CO2 29 01/06/2015 0943   CO2 27 05/12/2014 1343   GLUCOSE 104* 01/06/2015 0943   GLUCOSE 77 05/12/2014 1343   BUN 8 01/06/2015 0943   BUN 9 05/12/2014 1343   CREATININE 0.95 01/06/2015 0943   CREATININE 0.89 05/12/2014 1343   CALCIUM 8.6* 01/06/2015 0943   CALCIUM 8.8* 05/12/2014 1343   PROT 6.3* 01/06/2015 0943   PROT 5.9* 02/17/2014 1405   ALBUMIN 3.1* 01/06/2015 0943   ALBUMIN 2.8* 02/17/2014 1405   AST 13* 01/06/2015 0943   AST 9* 02/17/2014 1405   ALT 11* 01/06/2015 0943   ALT 12* 02/17/2014 1405   ALKPHOS 44 01/06/2015 0943   ALKPHOS 44* 02/17/2014 1405   BILITOT 0.3 01/06/2015 0943   BILITOT 0.2 02/17/2014 1405   GFRNONAA >60 01/06/2015 0943   GFRNONAA >60 05/12/2014 1343   GFRNONAA >60 02/17/2014 1405   GFRAA >60 01/06/2015 0943   GFRAA >60 05/12/2014 1343   GFRAA >60 02/17/2014 1405    No results found for: SPEP, UPEP  Lab Results  Component Value Date   WBC 6.0 01/20/2015   NEUTROABS 3.6 01/20/2015   HGB 12.2* 01/20/2015   HCT 37.4* 01/20/2015   MCV 95.2 01/20/2015   PLT 314 01/20/2015      Chemistry      Component Value Date/Time   NA 138 01/06/2015 0943   NA 135 05/12/2014 1343   K 3.8  01/06/2015 0943   K 3.3* 05/12/2014 1343   CL 102 01/06/2015 0943   CL 101 05/12/2014 1343   CO2 29 01/06/2015 0943   CO2 27 05/12/2014 1343   BUN 8 01/06/2015 0943   BUN 9 05/12/2014 1343   CREATININE 0.95 01/06/2015 0943   CREATININE 0.89 05/12/2014 1343      Component Value Date/Time   CALCIUM 8.6* 01/06/2015 0943   CALCIUM 8.8* 05/12/2014 1343   ALKPHOS 44 01/06/2015 0943   ALKPHOS 44* 02/17/2014 1405   AST 13* 01/06/2015 0943   AST 9* 02/17/2014 1405   ALT 11* 01/06/2015 0943   ALT 12* 02/17/2014 1405   BILITOT 0.3 01/06/2015 0943   BILITOT 0.2 02/17/2014 1405       RADIOGRAPHIC STUDIES: I have personally reviewed the radiological images as listed and agreed with the findings in the report. Ct Chest Wo Contrast  01/19/2015  CLINICAL DATA:  Worsening shortness of breath and chest pain for 1 year . Smoker. Multiple myeloma. EXAM: CT CHEST WITHOUT CONTRAST TECHNIQUE: Multidetector CT imaging of the chest was performed following the standard protocol without IV contrast. COMPARISON:  10/25/2013 FINDINGS: Mediastinum/Lymph Nodes: No masses or pathologically enlarged lymph nodes identified on this un-enhanced exam. Normal heart size. Pacemaker noted. Lungs/Pleura: Mild emphysema again noted. A focal area of central peribronchial airspace disease is seen within the lingula on image 33 which is new since previous study and suspicious for pneumonia or other inflammatory process. No other areas of airspace disease are seen. No evidence of mass or effusion. Upper abdomen: No acute findings. Musculoskeletal: Diffuse permeative pattern of osteopenia is demonstrated. Multiple new or increased vertebral body compression fractures are seen thoracic and upper lumbar spine compared to previous study. These findings are consistent with history of multiple myeloma. IMPRESSION: Mild central perihilar airspace opacity within the lingula, suspicious for pneumonia or inflammatory  process. Recommend  followup by chest CT in 3 months to confirm resolution. Mild emphysema. Diffuse osteopenia with multiple new or increased thoracic and upper lumbar vertebral body compression fracture deformities, consistent with history of multiple myeloma. Electronically Signed   By: Earle Gell M.D.   On: 01/19/2015 15:13     ASSESSMENT & PLAN:   # MULTIPLE MYELOMA- status post induction Velcade Revlimid and dexamethasone with a very good partial response. Patient is currently on Revlimid and dexamethasone maintenance. This is currently on hold because of patient infectious issues/recent hospitalization. Most recent myeloma parameters appear stable with M spike of 0.5 g/dL and kappa to lambda free light chain ratio of 10. Patient should a off of Revlimid until resolution of infectious complications area  # COPD/left lingular pneumonia- ongoing cough /not significant improvement. At this point it is reasonable to restart antibiotics. Levaquin could be a good option for lung/skin infection, but because of concern for interaction with amiodarone, we will not administer Levaquin, but rather choose Augmentin. The patient will be seen in our clinic in 1 week, in the meantime he will check his temperature twice a day and report temperature diary during the next visit. # Left index finger cellulitis/ Left hip infection healing. Continue with wound care.  #  Swelling in the legs-improved on low-dose of Lasix.  # chronic pain/anxiety- on fentanyl and Dilaudid; Xanax. Medications refilled  #Bisphosphonates-creatinine is stable, so we will administer Zometa today.  # Will reevaluate the patient in 1 week. If he continues to have fevers, will admit for IV antibiotics.  # 25 minutes face-to-face with the patient discussing the above plan of care; more than 50% of time spent on prognosis/ natural history; counseling and coordination.       [name] Hires, MD 01/20/2015 1:43 PM

## 2015-01-22 ENCOUNTER — Encounter (HOSPITAL_BASED_OUTPATIENT_CLINIC_OR_DEPARTMENT_OTHER): Payer: Medicaid Other | Admitting: General Surgery

## 2015-01-22 ENCOUNTER — Encounter: Payer: Self-pay | Admitting: General Surgery

## 2015-01-22 DIAGNOSIS — L03012 Cellulitis of left finger: Secondary | ICD-10-CM

## 2015-01-22 DIAGNOSIS — L89223 Pressure ulcer of left hip, stage 3: Secondary | ICD-10-CM | POA: Diagnosis not present

## 2015-01-22 DIAGNOSIS — L8990 Pressure ulcer of unspecified site, unspecified stage: Secondary | ICD-10-CM | POA: Diagnosis not present

## 2015-01-22 DIAGNOSIS — IMO0001 Reserved for inherently not codable concepts without codable children: Secondary | ICD-10-CM

## 2015-01-22 NOTE — Progress Notes (Signed)
[email or web address] 

## 2015-01-23 NOTE — Progress Notes (Signed)
APOSTOLOS, BIERNAT ([id number]) Visit Report for 01/22/2015 Arrival Information Details Patient Name: [name], [name] Date of Service: 01/22/2015 11:30 AM Medical Record Number: [id number] Patient Account Number: [id number] Date of Birth/Sex: 1961/01/14 (54 y.o. Male) Treating RN: Ahmed Prima Primary Care Physician: Sena Hitch Other Clinician: Referring Physician: Sena Hitch Treating Physician/Extender: Benjaman Pott in Treatment: 11 Visit Information History Since Last Visit All ordered tests and consults were completed: No Patient Arrived: Ambulatory Added or deleted any medications: No Arrival Time: 11:38 Any new allergies or adverse reactions: No Accompanied By: wife Had a fall or experienced change in Yes Transfer Assistance: None activities of daily living that may affect Patient Identification Verified: Yes risk of falls: Secondary Verification Process Yes Signs or symptoms of abuse/neglect since last No Completed: visito Patient Requires Transmission-Based No Hospitalized since last visit: No Precautions: Pain Present Now: No Patient Has Alerts: No Electronic Signature(s) Signed: 01/22/2015 1:51:49 PM By: Alric Quan Entered By: Alric Quan on 01/22/2015 11:39:18 [name] -------------------------------------------------------------------------------- Encounter Discharge Information Details Patient Name: [name] Date of Service: 01/22/2015 11:30 AM Medical Record Number: [id number] Patient Account Number: [id number] Date of Birth/Sex: 01/04/1961 (54 y.o. Male) Treating RN: Ahmed Prima Primary Care Physician: Sena Hitch Other Clinician: Referring Physician: Sena Hitch Treating Physician/Extender: Benjaman Pott in Treatment: 11 Encounter Discharge Information Items Discharge Pain Level: 0 Discharge Condition: Stable Ambulatory Status: Ambulatory Discharge Destination:  Home Transportation: Private Auto Accompanied By: wife Schedule Follow-up Appointment: Yes Medication Reconciliation completed and provided to Patient/Care Yes Zya Finkle: Provided on Clinical Summary of Care: 01/22/2015 Form Type Recipient Paper Patient rm Electronic Signature(s) Signed: 01/22/2015 12:36:04 PM By: Judene Companion MD Previous Signature: 01/22/2015 12:14:26 PM Version By: Sharon Mt Entered By: Judene Companion on 01/22/2015 12:36:04 [name] -------------------------------------------------------------------------------- Lower Extremity Assessment Details Patient Name: [name] Date of Service: 01/22/2015 11:30 AM Medical Record Number: [id number] Patient Account Number: [id number] Date of Birth/Sex: 19-Mar-1960 (54 y.o. Male) Treating RN: Ahmed Prima Primary Care Physician: Sena Hitch Other Clinician: Referring Physician: Sena Hitch Treating Physician/Extender: Benjaman Pott in Treatment: 11 Electronic Signature(s) Signed: 01/22/2015 1:51:49 PM By: Alric Quan Entered By: Alric Quan on 01/22/2015 11:42:11 [name] Kitchen ([id number]) -------------------------------------------------------------------------------- Multi Wound Chart Details Patient Name: [name] Date of Service: 01/22/2015 11:30 AM Medical Record Number: [id number] Patient Account Number: [id number] Date of Birth/Sex: [birth year] (54 y.o. Male) Treating RN: Ahmed Prima Primary Care Physician: Sena Hitch Other Clinician: Referring Physician: Sena Hitch Treating Physician/Extender: Benjaman Pott in Treatment: 11 Vital Signs Height(in): 73 Pulse(bpm): 79 Weight(lbs): 132 Blood Pressure 95/57 (mmHg): Body Mass Index(BMI): 17 Temperature(F): 98.1 Respiratory Rate 18 (breaths/min): Photos: [1:No Photos] [2:No Photos] [3:No Photos] Wound Location: [1:Left Ischial Tuberosity] [2:Left, Circumferential  Hand Left Upper Leg - Lateral - 2nd Digit] Wounding Event: [1:Gradually Appeared] [2:Gradually Appeared] [3:Gradually Appeared] Primary Etiology: [1:Malignant Wound] [2:Cellulitis] [3:To be determined] Comorbid History: [1:Chronic Obstructive Pulmonary Disease (COPD), Arrhythmia, Hypertension, Received Radiation] [2:N/A] [3:Chronic Obstructive Pulmonary Disease (COPD), Arrhythmia, Hypertension, Received Radiation] Date Acquired: [1:10/19/2014] [2:12/24/2014] [3:01/08/2015] Weeks of Treatment: [1:11] [2:3] [3:0] Wound Status: [1:Open] [2:Open] [3:Open] Measurements L x W x D 0.3x0.5x0.1 [2:0x0x0] [3:1x1x0.1] (cm) Area (cm) : [1:0.118] [2:0] [3:0.785] Volume (cm) : [1:0.012] [2:0] [3:0.079] % Reduction in Area: [1:96.60%] [2:100.00%] [3:N/A] % Reduction in Volume: 96.50% [2:100.00%] [3:N/A] Classification: [1:Partial Thickness] [2:Partial Thickness] [3:Partial Thickness] Exudate Amount: [1:Medium] [2:N/A] [3:None Present] Exudate Type: [1:Serous] [2:N/A] [3:N/A] Exudate Color: [1:amber] [2:N/A] [3:N/A] Wound Margin: [1:Distinct, outline attached] [2:N/A] [3:Flat  and Intact] Granulation Amount: [1:None Present (0%)] [2:N/A] [3:None Present (0%)] Necrotic Amount: [1:Large (67-100%)] [2:N/A] [3:Large (67-100%)] Necrotic Tissue: [1:Adherent Slough] [2:N/A] [3:Eschar] Exposed Structures: [1:Fascia: No Fat: No Tendon: No] [2:N/A] [3:Fascia: No Fat: No Tendon: No] Muscle: No Muscle: No Joint: No Joint: No Bone: No Bone: No Limited to Skin Limited to Skin Breakdown Breakdown Epithelialization: None N/A None Periwound Skin Texture: Edema: Yes No Abnormalities Noted No Abnormalities Noted Excoriation: No Induration: No Callus: No Crepitus: No Fluctuance: No Friable: No Rash: No Scarring: No Periwound Skin Moist: Yes No Abnormalities Noted Dry/Scaly: Yes Moisture: Maceration: No Dry/Scaly: No Periwound Skin Color: Erythema: Yes No Abnormalities Noted No Abnormalities  Noted Hemosiderin Staining: Yes Atrophie Blanche: No Cyanosis: No Ecchymosis: No Mottled: No Pallor: No Rubor: No Erythema Location: Circumferential N/A N/A Erythema Measurement: Measured: 7cm N/A N/A Temperature: No Abnormality N/A N/A Tenderness on Yes No No Palpation: Wound Preparation: Ulcer Cleansing: N/A Ulcer Cleansing: Rinsed/Irrigated with Rinsed/Irrigated with Saline Saline Topical Anesthetic Topical Anesthetic Applied: Other: lidocaine Applied: Other: lidocaine 4% 4% cream Treatment Notes Electronic Signature(s) Signed: 01/22/2015 1:51:49 PM By: Alric Quan Entered By: Alric Quan on 01/22/2015 11:57:35 Weitzel, Hanna [name] Kitchen ([id number]) -------------------------------------------------------------------------------- Sharon Details Patient Name: [name] Date of Service: 01/22/2015 11:30 AM Medical Record Number: [id number] Patient Account Number: [id number] Date of Birth/Sex: [birth year] (54 y.o. Male) Treating RN: Ahmed Prima Primary Care Physician: Sena Hitch Other Clinician: Referring Physician: Sena Hitch Treating Physician/Extender: Benjaman Pott in Treatment: 11 Active Inactive Abuse / Safety / Falls / Self Care Management Nursing Diagnoses: Impaired physical mobility Potential for falls Goals: Patient will remain injury free Date Initiated: 01/08/2015 Goal Status: Active Patient/caregiver will verbalize/demonstrate measures taken to improve the patient's personal safety Date Initiated: 01/08/2015 Goal Status: Active Patient/caregiver will verbalize/demonstrate measures taken to prevent injury and/or falls Date Initiated: 01/22/2015 Goal Status: Active Interventions: Assess fall risk on admission and as needed Notes: Orientation to the Wound Care Program Nursing Diagnoses: Knowledge deficit related to the wound healing center program Goals: Patient/caregiver will verbalize understanding  of the [place] [facility] Date Initiated: 11/19/2014 Goal Status: Active Interventions: Provide education on orientation to the wound center Notes: Gero, Mekai A. ([id number]) Pressure Nursing Diagnoses: Knowledge deficit related to causes and risk factors for pressure ulcer development Goals: Patient will remain free of pressure ulcers Date Initiated: 11/19/2014 Goal Status: Active Interventions: Assess: immobility, friction, shearing, incontinence upon admission and as needed Treatment Activities: Patient referred for seating evaluation to ensure proper offloading : 01/22/2015 Notes: Wound/Skin Impairment Nursing Diagnoses: Impaired tissue integrity Goals: Ulcer/skin breakdown will heal within 14 weeks Date Initiated: 11/19/2014 Goal Status: Active Interventions: Assess ulceration(s) every visit Treatment Activities: Topical wound management initiated : 01/22/2015 Notes: Electronic Signature(s) Signed: 01/22/2015 1:51:49 PM By: Alric Quan Entered By: Alric Quan on 01/22/2015 13:30:35 Gulotta, Renan [name] Kitchen ([id number]) -------------------------------------------------------------------------------- Pain Assessment Details Patient Name: [name] Date of Service: 01/22/2015 11:30 AM Medical Record Number: [id number] Patient Account Number: [id number] Date of Birth/Sex: 10-10-1960 (54 y.o. Male) Treating RN: Ahmed Prima Primary Care Physician: Sena Hitch Other Clinician: Referring Physician: Sena Hitch Treating Physician/Extender: Benjaman Pott in Treatment: 11 Active Problems Location of Pain Severity and Description of Pain Patient Has Paino No Site Locations Pain Management and Medication Current Pain Management: Electronic Signature(s) Signed: 01/22/2015 1:51:49 PM By: Alric Quan Entered By: Alric Quan on 01/22/2015 11:39:26 Nachtigal, [name]  ([id number]) -------------------------------------------------------------------------------- Patient/Caregiver Education Details Patient Name: [name] Date of Service: 01/22/2015 11:30 AM  Medical Record Number: [id number] Patient Account Number: [id number] Date of Birth/Gender: 11-Sep-1960 (54 y.o. Male) Treating RN: Carolyne Fiscal, Debi Primary Care Physician: Sena Hitch Other Clinician: Referring Physician: Sena Hitch Treating Physician/Extender: Benjaman Pott in Treatment: 11 Education Assessment Education Provided To: Patient Education Topics Provided Wound/Skin Impairment: Handouts: Other: change dressing as directed Methods: Demonstration, Explain/Verbal Responses: State content correctly Electronic Signature(s) Signed: 01/22/2015 12:36:14 PM By: Judene Companion MD Entered By: Judene Companion on 01/22/2015 12:36:14 Schloss, [name] -------------------------------------------------------------------------------- Wound Assessment Details Patient Name: [name] Date of Service: 01/22/2015 11:30 AM Medical Record Number: [id number] Patient Account Number: [id number] Date of Birth/Sex: [birth year] (54 y.o. 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 Wound Open Wounding Event: Gradually Appeared Status: Date Acquired: 10/19/2014 Comorbid Chronic Obstructive Pulmonary Weeks Of Treatment: 11 History: Disease (COPD), Arrhythmia, Clustered Wound: No Hypertension, Received Radiation Photos Photo Uploaded By: Alric Quan on 01/22/2015 13:38:07 Wound Measurements Length: (cm) 0.3 Width: (cm) 0.5 Depth: (cm) 0.1 Area: (cm) 0.118 Volume: (cm) 0.012 % Reduction in Area:  96.6% % Reduction in Volume: 96.5% Epithelialization: None Tunneling: No Undermining: No Wound Description Classification: Partial Thickness Wound Margin: Distinct, outline attached Exudate Amount: Medium Exudate Type: Serous Exudate Color: amber Foul Odor After Cleansing: No Wound Bed Granulation Amount: None Present (0%) Exposed Structure Necrotic Amount: Large (67-100%) Fascia Exposed: No Necrotic Quality: Adherent Slough Fat Layer Exposed: No Tendon Exposed: No Criscuolo, Adriaan A. ([id number]) Muscle Exposed: No Joint Exposed: No Bone Exposed: No Limited to Skin Breakdown Periwound Skin Texture Texture Color No Abnormalities Noted: No No Abnormalities Noted: No Callus: No Atrophie Blanche: No Crepitus: No Cyanosis: No Excoriation: No Ecchymosis: No Fluctuance: No Erythema: Yes Friable: No Erythema Location: Circumferential Induration: No Erythema Measurement: Measured Localized Edema: Yes 7 cm Rash: No Hemosiderin Staining: Yes Scarring: No Mottled: No Pallor: No Moisture Rubor: No No Abnormalities Noted: No Dry / Scaly: No Temperature / Pain Maceration: No Temperature: No Abnormality Moist: Yes Tenderness on Palpation: Yes Wound Preparation Ulcer Cleansing: Rinsed/Irrigated with Saline Topical Anesthetic Applied: Other: lidocaine 4%, Treatment Notes Wound #1 (Left Ischial Tuberosity) 1. Cleansed with: Clean wound with Normal Saline 2. Anesthetic Topical Lidocaine 4% cream to wound bed prior to debridement 3. Peri-wound Care: Skin Prep 4. Dressing Applied: Aquacel Ag 5. Secondary Dressing Applied Bordered Foam Dressing Electronic Signature(s) Signed: 01/22/2015 1:51:49 PM By: Alric Quan Entered By: Alric Quan on 01/22/2015 11:49:32 Roser, Said [name] Kitchen ([id number]) -------------------------------------------------------------------------------- Wound Assessment Details Patient Name: [name] Date of Service: 01/22/2015 11:30  AM Medical Record Number: [id number] Patient Account Number: [id number] Date of Birth/Sex: 05/10/1960 (54 y.o. Male) Treating RN: Ahmed Prima Primary Care Physician: Sena Hitch Other Clinician: Referring Physician: Sena Hitch Treating Physician/Extender: Benjaman Pott in Treatment: 11 Wound Status Wound Number: 2 Primary Cellulitis Etiology: Wound Location: Left Hand - 2nd Digit - Circumfernential Wound Healed - Epithelialized Status: Wounding Event: Gradually Appeared Comorbid Chronic Obstructive Pulmonary Date Acquired: 12/24/2014 History: Disease (COPD), Arrhythmia, Weeks Of Treatment: 3 Hypertension, Received Radiation Clustered Wound: No Photos Photo Uploaded By: Alric Quan on 01/22/2015 13:38:08 Wound Measurements Length: (cm) 0 % Reduction i Width: (cm) 0 % Reduction i Depth: (cm) 0 Epithelializa Area: (cm) 0 Tunneling: Volume: (cm) 0 Undermining: n Area: 100% n Volume: 100% tion: Large (67-100%) No No Wound Description Classification: Partial Thickness Foul  Odor Aft Exudate Amount: None Present er Cleansing: No Wound Bed Granulation Amount: None Present (0%) Necrotic Amount: None Present (0%) Periwound Skin Texture Texture Color No Abnormalities Noted: No No Abnormalities Noted: No Moisture Hoog, Rydell A. ([id number]) No Abnormalities Noted: No Wound Preparation Ulcer Cleansing: Rinsed/Irrigated with Saline Topical Anesthetic Applied: None Electronic Signature(s) Signed: 01/22/2015 1:51:49 PM By: Alric Quan Entered By: Alric Quan on 01/22/2015 12:01:43 Hallowell, Abby [name] Kitchen ([id number]) -------------------------------------------------------------------------------- Wound Assessment Details Patient Name: [name] Date of Service: 01/22/2015 11:30 AM Medical Record Number: [id number] Patient Account Number: [id number] Date of Birth/Sex: 1960-06-12 (54 y.o. Male) Treating RN: Ahmed Prima Primary Care  Physician: Sena Hitch Other Clinician: Referring Physician: Sena Hitch Treating Physician/Extender: Benjaman Pott in Treatment: 11 Wound Status Wound Number: 3 Primary To be determined Etiology: Wound Location: Left Upper Leg - Lateral Wound Open Wounding Event: Gradually Appeared Status: Date Acquired: 01/08/2015 Comorbid Chronic Obstructive Pulmonary Weeks Of Treatment: 0 History: Disease (COPD), Arrhythmia, Clustered Wound: No Hypertension, Received Radiation Photos Photo Uploaded By: Alric Quan on 01/22/2015 13:38:37 Wound Measurements Length: (cm) 1 Width: (cm) 1 Depth: (cm) 0.1 Area: (cm) 0.785 Volume: (cm) 0.079 % Reduction in Area: % Reduction in Volume: Epithelialization: None Tunneling: No Undermining: No Wound Description Classification: Partial Thickness Foul Odor Aft Wound Margin: Flat and Intact Exudate Amount: None Present er Cleansing: No Wound Bed Granulation Amount: None Present (0%) Exposed Structure Necrotic Amount: Large (67-100%) Fascia Exposed: No Necrotic Quality: Eschar Fat Layer Exposed: No Tendon Exposed: No Muscle Exposed: No Joint Exposed: No Enderle, Jeromey A. ([id number]) Bone Exposed: No Limited to Skin Breakdown Periwound Skin Texture Texture Color No Abnormalities Noted: No No Abnormalities Noted: No Moisture No Abnormalities Noted: No Dry / Scaly: Yes Wound Preparation Ulcer Cleansing: Rinsed/Irrigated with Saline Topical Anesthetic Applied: Other: lidocaine 4% cream, Treatment Notes Wound #3 (Left, Lateral Upper Leg) 1. Cleansed with: Clean wound with Normal Saline 2. Anesthetic Topical Lidocaine 4% cream to wound bed prior to debridement 3. Peri-wound Care: Skin Prep 4. Dressing Applied: Aquacel Ag 5. Secondary Dressing Applied Bordered Foam Dressing Electronic Signature(s) Signed: 01/22/2015 1:51:49 PM By: Alric Quan Entered By: Alric Quan on 01/22/2015 11:49:06 Mohr,  [name] -------------------------------------------------------------------------------- Vitals Details Patient Name: [name] Date of Service: 01/22/2015 11:30 AM Medical Record Number: [id number] Patient Account Number: [id number] Date of Birth/Sex: [birth year] (54 y.o. Male) Treating RN: Ahmed Prima Primary Care Physician: Sena Hitch Other Clinician: Referring Physician: Sena Hitch Treating Physician/Extender: Benjaman Pott in Treatment: 11 Vital Signs Time Taken: 11:39 Temperature (F): 98.1 Height (in): 73 Pulse (bpm): 79 Weight (lbs): 132 Respiratory Rate (breaths/min): 18 Body Mass Index (BMI): 17.4 Blood Pressure (mmHg): 95/57 Reference Range: 80 - 120 mg / dl Electronic Signature(s) Signed: 01/22/2015 1:51:49 PM By: Alric Quan Entered By: Alric Quan on 01/22/2015 11:41:05

## 2015-01-23 NOTE — Progress Notes (Signed)
ALIKA, SALADIN ([id number]) Visit Report for 01/22/2015 Chief Complaint Document Details Patient Name: [name], [name] 01/22/2015 11:30 Date of Service: AM Medical Record [id number] Number: Patient Account Number: [id number] 1960-11-26 (54 y.o. Treating RN: Ahmed Prima Date of Birth/Sex: Male) Other Clinician: Primary Care Physician: Richardson Dopp, Rusti Arizmendi Referring Physician: Sena Hitch Physician/Extender: Weeks in Treatment: 11 Information Obtained from: Patient Chief Complaint Patient is at the clinic for treatment of an open pressure ulcer. 54 year old gentleman with a ulcerated area on his left hip she's had for 3 weeks. Electronic Signature(s) Signed: 01/22/2015 12:32:34 PM By: Judene Companion MD Entered By: Judene Companion on 01/22/2015 12:32:34 [place], [name] ([id number]) -------------------------------------------------------------------------------- Debridement Details Patient Name: [name] Form A. 01/22/2015 11:30 Date of Service: AM Medical Record [id number] Number: Patient Account Number: [id number] [birth year] (54 y.o. Treating RN: Ahmed Prima Date of Birth/Sex: Male) Other Clinician: Primary Care Physician: Richardson Dopp, [place] Referring Physician: Sena Hitch Physician/Extender: Weeks in Treatment: 11 Debridement Performed for Wound #3 Left,Lateral Upper Leg Assessment: Performed By: Physician Judene Companion, MD Debridement: Debridement Pre-procedure Yes Verification/Time Out Taken: Start Time: 11:58 Pain Control: Other : lidocaine 4% cream Level: Skin/Subcutaneous Tissue Total Area Debrided (L x 1 (cm) x 1 (cm) = 1 (cm) W): Tissue and other Viable, Non-Viable, Eschar material debrided: Instrument: Forceps Bleeding: Minimum Hemostasis Achieved: Pressure End Time: 12:01 Procedural Pain: 0 Post Procedural Pain: 0 Response to Treatment: Procedure was tolerated well Post Debridement  Measurements of Total Wound Length: (cm) 0.2 Width: (cm) 0.2 Depth: (cm) 0.1 Volume: (cm) 0.003 Post Procedure Diagnosis Same as Pre-procedure Electronic Signature(s) Signed: 01/22/2015 1:09:45 PM By: Judene Companion MD Signed: 01/22/2015 1:51:49 PM By: Alric Quan Entered By: Alric Quan on 01/22/2015 12:00:47 Se[place]ner, [name] ([id number]) -------------------------------------------------------------------------------- HPI Details Patient Name: [name] Form A. 01/22/2015 11:30 Date of Service: AM Medical Record [id number] Number: Patient Account Number: [id number] 1961-01-20 (54 y.o. Treating RN: Ahmed Prima Date of Birth/Sex: Male) Other Clinician: Primary Care Physician: Richardson Dopp, Lupie Sawa Referring Physician: Sena Hitch Physician/Extender: Weeks in Treatment: 11 History of Present Illness Location: left hip area Quality: Patient reports experiencing a dull pain to affected area(s). Severity: Patient states wound are getting worse. Duration: Patient has had the wound for < 3 weeks prior to presenting for treatment Timing: Pain in wound is Intermittent (comes and goes Context: The wound appeared gradually over time Modifying Factors: Other treatment(s) tried include: local dressing changes with antibiotic cream Associated Signs and Symptoms: Patient reports having difficulty standing for long periods. HPI Description: The patient is under treatment with medical oncology Dr. Ma Hillock sees him, for multiple myeloma, anemia, COPD, peripheral neuropathy, pedal edema and has been referred to [place] for left lateral hip pressure ulcer. his past medical history significant for coronary artery disease, dilated cardiomyopathy with an EF of 20% and has had her AICD placement, kidney stones, asthma, COPD, polysubstance abuse, anxiety. continues to smoke a pack of cigarettes a day, history of recreational drug usage and used to take IV drugs in the  late 1980s. He was in bed for a long while due to an associated illness and lay on his left side for prolonged periods of time. He has been ambulatory otherwise and has a fairly poor nutrition. 01/01/2015 -- I have reviewed the notes whether the patient was recently admitted to the hospital between November 30 and 12/27/2014. He was admitted for cellulitis of the left second finger, COPD exacerbation and hypotension and was found that no surgical debridement was  required after a surgical consult by Dr. Adonis Huguenin. he was treated with injectable vancomycin and Zosyn initially and then sent home on oral Augmentin. Electronic Signature(s) Signed: 01/22/2015 12:32:43 PM By: Judene Companion MD Entered By: Judene Companion on 01/22/2015 12:32:43 [name], [name] ([id number]) -------------------------------------------------------------------------------- Physical Exam Details Patient Name: [name], [name] 01/22/2015 11:30 Date of Service: AM Medical Record [id number] Number: Patient Account Number: [id number] 1960-09-23 (54 y.o. Treating RN: Ahmed Prima Date of Birth/Sex: Male) Other Clinician: Primary Care Physician: Richardson Dopp, Zakyria Metzinger Referring Physician: Sena Hitch Physician/Extender: Weeks in Treatment: 11 Electronic Signature(s) Signed: 01/22/2015 12:32:50 PM By: Judene Companion MD Entered By: Judene Companion on 01/22/2015 12:32:50 [name], [name] ([id number]) -------------------------------------------------------------------------------- Physician Orders Details Patient Name: [name], [name] 01/22/2015 11:30 Date of Service: AM Medical Record [id number] Number: Patient Account Number: [id number] 1960-12-02 (54 y.o. Treating RN: Ahmed Prima Date of Birth/Sex: Male) Other Clinician: Primary Care Physician: Richardson Dopp, Maudine Kluesner Referring Physician: Sena Hitch Physician/Extender: Weeks in Treatment: 11 Verbal / Phone Orders:  Yes Clinician: Pinkerton, Debi Read Back and Verified: Yes Diagnosis Coding Wound Cleansing Wound #1 Left Ischial Tuberosity o Clean wound with Normal Saline. Wound #3 Left,Lateral Upper Leg o Clean wound with Normal Saline. Anesthetic Wound #1 Left Ischial Tuberosity o Topical Lidocaine 4% cream applied to wound bed prior to debridement Wound #3 Left,Lateral Upper Leg o Topical Lidocaine 4% cream applied to wound bed prior to debridement Skin Barriers/Peri-Wound Care Wound #1 Left Ischial Tuberosity o Skin Prep Wound #3 Left,Lateral Upper Leg o Skin Prep Primary Wound Dressing Wound #1 Left Ischial Tuberosity o Aquacel Ag Wound #3 Left,Lateral Upper Leg o Aquacel Ag Secondary Dressing Wound #1 Left Ischial Tuberosity o Boardered Foam Dressing Wound #3 Left,Lateral Upper Leg o Boardered Foam Dressing [name], [name] A. ([id number]) Dressing Change Frequency Wound #1 Left Ischial Tuberosity o Change dressing every other day. Wound #3 Left,Lateral Upper Leg o Change dressing every other day. Follow-up Appointments Wound #1 Left Ischial Tuberosity o Return Appointment in 1 week. Wound #3 Left,Lateral Upper Leg o Return Appointment in 1 week. Home Health Wound #1 Left Ischial [place] Nurse may visit PRN to address patientos wound care needs. o FACE TO FACE ENCOUNTER: MEDICARE and MEDICAID PATIENTS: I certify that this patient is under my care and that I had a face-to-face encounter that meets the physician face-to-face encounter requirements with this patient on this date. The encounter with the patient was in whole or in part for the following MEDICAL CONDITION: (primary reason for [place]) MEDICAL NECESSITY: I certify, that based on my findings, NURSING services are a medically necessary home health service. HOME BOUND STATUS: I certify that my clinical findings support that this  patient is homebound (i.e., Due to illness or injury, pt requires aid of supportive devices such as crutches, cane, wheelchairs, walkers, the use of special transportation or the assistance of another person to leave their place of residence. There is a normal inability to leave the home and doing so requires considerable and taxing effort. Other absences are for medical reasons / religious services and are infrequent or of short duration when for other reasons). o If current dressing causes regression in wound condition, may D/C ordered dressing product/s and apply Normal Saline Moist Dressing daily until next [place] / Other MD appointment. [place] of regression in wound condition at [phone]. o Please direct any NON-WOUND related issues/requests for orders to patient's  Primary Care Physician Wound #3 Left,Lateral Upper Leg o [place] Nurse may visit PRN to address patientos wound care needs. o FACE TO FACE ENCOUNTER: MEDICARE and MEDICAID PATIENTS: I certify that this patient is under my care and that I had a face-to-face encounter that meets the physician face-to-face encounter requirements with this patient on this date. The encounter with the patient was in whole or in part for the following MEDICAL CONDITION: (primary reason for [place]) MEDICAL NECESSITY: I certify, that based on my findings, NURSING services are a medically necessary home health service. HOME BOUND STATUS: I certify that my clinical findings support that this patient is homebound (i.e., Due to illness or injury, pt requires aid of supportive devices such as crutches, cane, wheelchairs, walkers, the use of special transportation or the assistance of another person to leave their place of residence. There is a Wuertz, Adalid A. ([id number]) normal inability to leave the home and doing so requires considerable and taxing effort.  Other absences are for medical reasons / religious services and are infrequent or of short duration when for other reasons). o If current dressing causes regression in wound condition, may D/C ordered dressing product/s and apply Normal Saline Moist Dressing daily until next [place] / Other MD appointment. [place] of regression in wound condition at [phone]. o Please direct any NON-WOUND related issues/requests for orders to patient's Primary Care Physician Electronic Signature(s) Signed: 01/22/2015 1:09:45 PM By: Judene Companion MD Signed: 01/22/2015 1:51:49 PM By: Alric Quan Entered By: Alric Quan on 01/22/2015 12:05:04 [name], [name] ([id number]) -------------------------------------------------------------------------------- Problem List Details Patient Name: HARRISON, ZETINA 01/22/2015 11:30 Date of Service: AM Medical Record [id number] Number: Patient Account Number: [id number] 10/19/1960 (54 y.o. Treating RN: Ahmed Prima Date of Birth/Sex: Male) Other Clinician: Primary Care Physician: Richardson Dopp, [place] Referring Physician: Sena Hitch Physician/Extender: Weeks in Treatment: 11 Active Problems ICD-10 Encounter Code Description Active Date Diagnosis L89.223 Pressure ulcer of left hip, stage 3 11/05/2014 Yes C90.00 Multiple myeloma not having achieved remission 11/05/2014 Yes F17.218 Nicotine dependence, cigarettes, with other nicotine- 11/05/2014 Yes induced disorders E44.1 Mild protein-calorie malnutrition 11/05/2014 Yes S61.210A Laceration without foreign body of right index finger 01/01/2015 Yes without damage to nail, initial encounter Inactive Problems Resolved Problems Electronic Signature(s) Signed: 01/22/2015 12:32:25 PM By: Judene Companion MD Entered By: Judene Companion on 01/22/2015 12:32:25 [name], [name]  ([id number]) -------------------------------------------------------------------------------- Progress Note Details Patient Name: [name] Form A. 01/22/2015 11:30 Date of Service: AM Medical Record [id number] Number: Patient Account Number: [id number] 1960-10-21 (54 y.o. Treating RN: Ahmed Prima Date of Birth/Sex: Male) Other Clinician: Primary Care Physician: Richardson Dopp, Carleen Rhue Referring Physician: Sena Hitch Physician/Extender: Weeks in Treatment: 11 Subjective Chief Complaint Information obtained from Patient Patient is at the clinic for treatment of an open pressure ulcer. 54 year old gentleman with a ulcerated area on his left hip she's had for 3 weeks. History of Present Illness (HPI) The following HPI elements were documented for the patient's wound: Location: left hip area Quality: Patient reports experiencing a dull pain to affected area(s). Severity: Patient states wound are getting worse. Duration: Patient has had the wound for < 3 weeks prior to presenting for treatment Timing: Pain in wound is Intermittent (comes and goes Context: The wound appeared gradually over time Modifying Factors: Other treatment(s) tried include: local dressing changes with antibiotic cream Associated Signs and Symptoms: Patient reports having difficulty standing for long periods. The patient is  under treatment with medical oncology Dr. Ma Hillock sees him, for multiple myeloma, anemia, COPD, peripheral neuropathy, pedal edema and has been referred to [place] for left lateral hip pressure ulcer. his past medical history significant for coronary artery disease, dilated cardiomyopathy with an EF of 20% and has had her AICD placement, kidney stones, asthma, COPD, polysubstance abuse, anxiety. continues to smoke a pack of cigarettes a day, history of recreational drug usage and used to take IV drugs in the late 1980s. He was in bed for a long while due to an associated  illness and lay on his left side for prolonged periods of time. He has been ambulatory otherwise and has a fairly poor nutrition. 01/01/2015 -- I have reviewed the notes whether the patient was recently admitted to the hospital between November 30 and 12/27/2014. He was admitted for cellulitis of the left second finger, COPD exacerbation and hypotension and was found that no surgical debridement was required after a surgical consult by Dr. Adonis Huguenin. he was treated with injectable vancomycin and Zosyn initially and then sent home on oral Augmentin. Doshi, Jajuan A. ([id number]) Objective Constitutional Vitals Time Taken: 11:39 AM, Height: 73 in, Weight: 132 lbs, BMI: 17.4, Temperature: 98.1 F, Pulse: 79 bpm, Respiratory Rate: 18 breaths/min, Blood Pressure: 95/57 mmHg. Integumentary (Hair, Skin) Wound #1 status is Open. Original cause of wound was Gradually Appeared. The wound is located on the Left Ischial Tuberosity. The wound measures 0.3cm length x 0.5cm width x 0.1cm depth; 0.118cm^2 area and 0.012cm^3 volume. The wound is limited to skin breakdown. There is no tunneling or undermining noted. There is a medium amount of serous drainage noted. The wound margin is distinct with the outline attached to the wound base. There is no granulation within the wound bed. There is a large (67-100%) amount of necrotic tissue within the wound bed including Adherent Slough. The periwound skin appearance exhibited: Localized Edema, Moist, Hemosiderin Staining, Erythema. The periwound skin appearance did not exhibit: Callus, Crepitus, Excoriation, Fluctuance, Friable, Induration, Rash, Scarring, Dry/Scaly, Maceration, Atrophie Blanche, Cyanosis, Ecchymosis, Mottled, Pallor, Rubor. The surrounding wound skin color is noted with erythema which is circumferential. Erythema is measured at 7 cm. Periwound temperature was noted as No Abnormality. The periwound has tenderness on palpation. Wound #2 status is  Healed - Epithelialized. Original cause of wound was Gradually Appeared. The wound is located on the Left,Circumferential Hand - 2nd Digit. The wound measures 0cm length x 0cm width x 0cm depth; 0cm^2 area and 0cm^3 volume. There is no tunneling or undermining noted. There is a none present amount of drainage noted. There is no granulation within the wound bed. There is no necrotic tissue within the wound bed. Wound #3 status is Open. Original cause of wound was Gradually Appeared. The wound is located on the Left,Lateral Upper Leg. The wound measures 1cm length x 1cm width x 0.1cm depth; 0.785cm^2 area and 0.079cm^3 volume. The wound is limited to skin breakdown. There is no tunneling or undermining noted. There is a none present amount of drainage noted. The wound margin is flat and intact. There is no granulation within the wound bed. There is a large (67-100%) amount of necrotic tissue within the wound bed including Eschar. The periwound skin appearance exhibited: Dry/Scaly. Assessment Active Problems ICD-10 L89.223 - Pressure ulcer of left hip, stage 3 C90.00 - Multiple myeloma not having achieved remission F17.218 - Nicotine dependence, cigarettes, with other nicotine-induced disorders E44.1 - Mild protein-calorie malnutrition S61.210A - Laceration without foreign body of right index  finger without damage to nail, initial encounter [name], [name] A. ([id number]) Procedures Wound #3 Wound #3 is a To be determined located on the Left,Lateral Upper Leg . There was a Skin/Subcutaneous Tissue Debridement ([id number]) debridement with total area of 1 sq cm performed by Judene Companion, MD. with the following instrument(s): Forceps to remove Viable and Non-Viable tissue/material including Eschar after achieving pain control using Other (lidocaine 4% cream). A time out was conducted prior to the start of the procedure. A Minimum amount of bleeding was controlled with Pressure. The procedure was  tolerated well with a pain level of 0 throughout and a pain level of 0 following the procedure. Post Debridement Measurements: 0.2cm length x 0.2cm width x 0.1cm depth; 0.003cm^3 volume. Post procedure Diagnosis Wound #3: Same as Pre-Procedure Plan Wound Cleansing: Wound #1 Left Ischial Tuberosity: Clean wound with Normal Saline. Wound #3 Left,Lateral Upper Leg: Clean wound with Normal Saline. Anesthetic: Wound #1 Left Ischial Tuberosity: Topical Lidocaine 4% cream applied to wound bed prior to debridement Wound #3 Left,Lateral Upper Leg: Topical Lidocaine 4% cream applied to wound bed prior to debridement Skin Barriers/Peri-Wound Care: Wound #1 Left Ischial Tuberosity: Skin Prep Wound #3 Left,Lateral Upper Leg: Skin Prep Primary Wound Dressing: Wound #1 Left Ischial Tuberosity: Aquacel Ag Wound #3 Left,Lateral Upper Leg: Aquacel Ag Secondary Dressing: Wound #1 Left Ischial Tuberosity: Boardered Foam Dressing Wound #3 Left,Lateral Upper Leg: Boardered Foam Dressing Dressing Change Frequency: Wound #1 Left Ischial Tuberosity: Change dressing every other day. Ramires, [name] A. ([id number]) Wound #3 Left,Lateral Upper Leg: Change dressing every other day. Follow-up Appointments: Wound #1 Left Ischial Tuberosity: Return Appointment in 1 week. Wound #3 Left,Lateral Upper Leg: Return Appointment in 1 week. Home Health: Wound #1 Left Ischial Tuberosity: [place] Nurse may visit PRN to address patient s wound care needs. FACE TO FACE ENCOUNTER: MEDICARE and MEDICAID PATIENTS: I certify that this patient is under my care and that I had a face-to-face encounter that meets the physician face-to-face encounter requirements with this patient on this date. The encounter with the patient was in whole or in part for the following MEDICAL CONDITION: (primary reason for [place]) MEDICAL NECESSITY: I certify, that based on my findings, NURSING services  are a medically necessary home health service. HOME BOUND STATUS: I certify that my clinical findings support that this patient is homebound (i.e., Due to illness or injury, pt requires aid of supportive devices such as crutches, cane, wheelchairs, walkers, the use of special transportation or the assistance of another person to leave their place of residence. There is a normal inability to leave the home and doing so requires considerable and taxing effort. Other absences are for medical reasons / religious services and are infrequent or of short duration when for other reasons). If current dressing causes regression in wound condition, may D/C ordered dressing product/s and apply Normal Saline Moist Dressing daily until next [place] / Other MD appointment. [place] of regression in wound condition at [phone]. Please direct any NON-WOUND related issues/requests for orders to patient's Primary Care Physician Wound #3 Left,Lateral Upper Leg: [place] Nurse may visit PRN to address patient s wound care needs. FACE TO FACE ENCOUNTER: MEDICARE and MEDICAID PATIENTS: I certify that this patient is under my care and that I had a face-to-face encounter that meets the physician face-to-face encounter requirements with this patient on this date. The encounter with the patient was in whole or  in part for the following MEDICAL CONDITION: (primary reason for Home Healthcare) MEDICAL NECESSITY: I certify, that based on my findings, NURSING services are a medically necessary home health service. HOME BOUND STATUS: I certify that my clinical findings support that this patient is homebound (i.e., Due to illness or injury, pt requires aid of supportive devices such as crutches, cane, wheelchairs, walkers, the use of special transportation or the assistance of another person to leave their place of residence. There is a normal inability to leave  the home and doing so requires considerable and taxing effort. Other absences are for medical reasons / religious services and are infrequent or of short duration when for other reasons). If current dressing causes regression in wound condition, may D/C ordered dressing product/s and apply Normal Saline Moist Dressing daily until next [place] / Other MD appointment. [place] of regression in wound condition at [phone]. Please direct any NON-WOUND related issues/requests for orders to patient's Primary Care Physician Follow-Up Appointments: A follow-up appointment should be scheduled. Medication Reconciliation completed and provided to Patient/Care Provider. A Patient Clinical Summary of Care was provided to rm [name], Maninder A. ([id number]) Pressure ulcer left hip much better. Hasw spider bite left thigh that I debrided.. Treat with silver alginate daily Electronic Signature(s) Signed: 01/22/2015 12:34:54 PM By: Judene Companion MD Entered By: Judene Companion on 01/22/2015 12:34:54 Purdie, [name] ([id number]) -------------------------------------------------------------------------------- SuperBill Details Patient Name: [name] Date of Service: 01/22/2015 Medical Record Number: [id number] Patient Account Number: [id number] Date of Birth/Sex: 1961-01-03 (54 y.o. Male) Treating RN: Ahmed Prima Primary Care Physician: Sena Hitch Other Clinician: Referring Physician: Sena Hitch Treating Physician/Extender: Benjaman Pott in Treatment: 11 Diagnosis Coding ICD-10 Codes Code Description [phone] Pressure ulcer of left hip, stage 3 C90.00 Multiple myeloma not having achieved remission F17.218 Nicotine dependence, cigarettes, with other nicotine-induced disorders E44.1 Mild protein-calorie malnutrition Laceration without foreign body of right index finger without damage to nail, initial S61.210A encounter Facility  Procedures CPT4 Code: [id number] Description: 22241 - DEB SUBQ TISSUE 20 SQ CM/< ICD-10 Description Diagnosis C90.00 Multiple myeloma not having achieved remission Modifier: Quantity: 1 Physician Procedures CPT4 Code: 1464314 Description: 27670 - WC PHYS LEVEL 3 - EST PT ICD-10 Description Diagnosis L89.223 Pressure ulcer of left hip, stage 3 Modifier: Quantity: 1 CPT4 Code: 1100349 Description: 11042 - WC PHYS SUBQ TISS 20 SQ CM ICD-10 Description Diagnosis C90.00 Multiple myeloma not having achieved remission Modifier: Quantity: 1 Electronic Signature(s) Signed: 01/22/2015 12:35:34 PM By: Judene Companion MD Entered By: Judene Companion on 01/22/2015 12:35:34

## 2015-01-27 ENCOUNTER — Inpatient Hospital Stay (HOSPITAL_BASED_OUTPATIENT_CLINIC_OR_DEPARTMENT_OTHER): Payer: Medicaid Other | Admitting: Internal Medicine

## 2015-01-27 ENCOUNTER — Telehealth: Payer: Self-pay | Admitting: *Deleted

## 2015-01-27 ENCOUNTER — Inpatient Hospital Stay: Payer: Medicaid Other | Attending: Internal Medicine

## 2015-01-27 VITALS — BP 114/75 | HR 79 | Temp 96.6°F | Ht 60.4 in | Wt 136.7 lb

## 2015-01-27 DIAGNOSIS — Z792 Long term (current) use of antibiotics: Secondary | ICD-10-CM

## 2015-01-27 DIAGNOSIS — J449 Chronic obstructive pulmonary disease, unspecified: Secondary | ICD-10-CM

## 2015-01-27 DIAGNOSIS — Z79899 Other long term (current) drug therapy: Secondary | ICD-10-CM | POA: Insufficient documentation

## 2015-01-27 DIAGNOSIS — Z9221 Personal history of antineoplastic chemotherapy: Secondary | ICD-10-CM

## 2015-01-27 DIAGNOSIS — Z7982 Long term (current) use of aspirin: Secondary | ICD-10-CM | POA: Insufficient documentation

## 2015-01-27 DIAGNOSIS — L089 Local infection of the skin and subcutaneous tissue, unspecified: Secondary | ICD-10-CM | POA: Diagnosis not present

## 2015-01-27 DIAGNOSIS — L03012 Cellulitis of left finger: Secondary | ICD-10-CM

## 2015-01-27 DIAGNOSIS — I2510 Atherosclerotic heart disease of native coronary artery without angina pectoris: Secondary | ICD-10-CM | POA: Diagnosis not present

## 2015-01-27 DIAGNOSIS — J189 Pneumonia, unspecified organism: Secondary | ICD-10-CM | POA: Diagnosis not present

## 2015-01-27 DIAGNOSIS — F17210 Nicotine dependence, cigarettes, uncomplicated: Secondary | ICD-10-CM | POA: Insufficient documentation

## 2015-01-27 DIAGNOSIS — R600 Localized edema: Secondary | ICD-10-CM | POA: Diagnosis not present

## 2015-01-27 DIAGNOSIS — K219 Gastro-esophageal reflux disease without esophagitis: Secondary | ICD-10-CM | POA: Insufficient documentation

## 2015-01-27 DIAGNOSIS — I252 Old myocardial infarction: Secondary | ICD-10-CM | POA: Diagnosis not present

## 2015-01-27 DIAGNOSIS — F419 Anxiety disorder, unspecified: Secondary | ICD-10-CM | POA: Diagnosis not present

## 2015-01-27 DIAGNOSIS — C9000 Multiple myeloma not having achieved remission: Secondary | ICD-10-CM | POA: Diagnosis not present

## 2015-01-27 DIAGNOSIS — G8929 Other chronic pain: Secondary | ICD-10-CM

## 2015-01-27 DIAGNOSIS — E785 Hyperlipidemia, unspecified: Secondary | ICD-10-CM | POA: Diagnosis not present

## 2015-01-27 LAB — CBC WITH DIFFERENTIAL/PLATELET
Basophils Absolute: 0.1 10*3/uL (ref 0–0.1)
Basophils Relative: 1 %
EOS PCT: 2 %
Eosinophils Absolute: 0.1 10*3/uL (ref 0–0.7)
HEMATOCRIT: 38.9 % — AB (ref 40.0–52.0)
Hemoglobin: 12.7 g/dL — ABNORMAL LOW (ref 13.0–18.0)
LYMPHS ABS: 1.5 10*3/uL (ref 1.0–3.6)
LYMPHS PCT: 21 %
MCH: 31.3 pg (ref 26.0–34.0)
MCHC: 32.7 g/dL (ref 32.0–36.0)
MCV: 95.6 fL (ref 80.0–100.0)
Monocytes Absolute: 0.9 10*3/uL (ref 0.2–1.0)
Monocytes Relative: 14 %
NEUTROS ABS: 4.3 10*3/uL (ref 1.4–6.5)
Neutrophils Relative %: 62 %
PLATELETS: 287 10*3/uL (ref 150–440)
RBC: 4.07 MIL/uL — AB (ref 4.40–5.90)
RDW: 17.5 % — ABNORMAL HIGH (ref 11.5–14.5)
WBC: 6.9 10*3/uL (ref 3.8–10.6)

## 2015-01-27 LAB — BASIC METABOLIC PANEL
Anion gap: 5 (ref 5–15)
BUN: 8 mg/dL (ref 6–20)
CHLORIDE: 102 mmol/L (ref 101–111)
CO2: 28 mmol/L (ref 22–32)
Calcium: 8.7 mg/dL — ABNORMAL LOW (ref 8.9–10.3)
Creatinine, Ser: 0.99 mg/dL (ref 0.61–1.24)
GFR calc Af Amer: >60 mL/min (ref >60)
GLUCOSE: 119 mg/dL — AB (ref 65–99)
POTASSIUM: 4.1 mmol/L (ref 3.5–5.1)
Sodium: 135 mmol/L (ref 135–145)

## 2015-01-27 MED ORDER — DEXAMETHASONE 4 MG PO TABS: ORAL_TABLET | ORAL | Status: DC

## 2015-01-27 NOTE — Progress Notes (Signed)
[facility] OFFICE PROGRESS NOTE  Patient Care Team: Petra Kuba, MD as PCP - General (Family Medicine)   SUMMARY OF ONCOLOGIC HISTORY:  # SEP 2015- MULTIPLE MYELOMA [multiple PET pos Bone lesions; hypercalcemia s/p BMBx; FISH- Aneuploidy - gain of chromosome 7,9,15,FGFR3/4p16.3, and CCND1/11q13. Loss of MAF/16q23.1), cytogenetics normal 46XY] s/p Vel-Dex-Rev-Zometa; Excellent PR   # MARCH 2016- REV-DEX   # Chronic pain  # COPD/smoking/ [[facility]- not candidate for BMT- sec to co-morbidities/poor nutritional status];     INTERVAL HISTORY:  Mr. Rekowski returns to our clinic for a follow-up visit. This is a 55 year old male patient with above history of multiple myeloma on maintenance Revlimid-dexamethasone [currently on hold] was recently evaluated in the hospital when he was admitted for bronchitis/COPD; also possible cellulitis of his left finger.  Last week patient was seen for persistent cough and fevers. He had CT of the chest, which was consistent with lingular pneumonia. He was started on Augmentin with significant improvement of his overall state of health. He has no fevers and significantly less cough. He feels more energetic, and is able to perform most of the activities of daily living with minimal discomfort.  No chest pain; overall continued aches and pains in his rest of his bones. He continues to be on fentanyl patch/Dilaudid pills.   His wound of the left finger is improving; and also the left lateral buttock wound is healing. He is going to wound care    REVIEW OF SYSTEMS:  A complete 10 point review of system is done which is negative except mentioned above/history of present illness.   PAST MEDICAL HISTORY :  Past Medical History  Diagnosis Date  . Multiple myeloma ([place])   . Multiple myeloma ([place])   . Anxiety   . CHF (congestive heart failure) ([place])   . COPD (chronic obstructive pulmonary disease) ([place])   . GERD (gastroesophageal reflux  disease)   . Substance abuse   . Myocardial infarction ([place])   . Coronary artery disease   . Atrial septal defect   . Hyperlipidemia   . Dysrhythmias     PAST SURGICAL HISTORY :  No past surgical history on file.  FAMILY HISTORY :   Family History  Problem Relation Age of Onset  . COPD Mother   . CAD Mother   . Heart attack Father     SOCIAL HISTORY:   Social History  Substance Use Topics  . Smoking status: Current Every Day Smoker -- 0.50 packs/day for 40 years    Types: Cigarettes  . Smokeless tobacco: Not on file  . Alcohol Use: No    ALLERGIES:  has No Known Allergies.  MEDICATIONS:  Current Outpatient Prescriptions  Medication Sig Dispense Refill  . albuterol (PROVENTIL HFA;VENTOLIN HFA) 108 (90 BASE) MCG/ACT inhaler Inhale 2 puffs into the lungs every 6 (six) hours as needed for wheezing or shortness of breath.    . ALPRAZolam (XANAX) 0.5 MG tablet Take 1-2 tablets (0.5-1 mg total) by mouth 3 (three) times daily as needed for anxiety. 90 tablet 0  . amiodarone (PACERONE) 200 MG tablet Take 200 mg by mouth daily.    Marland Kitchen amoxicillin-clavulanate (AUGMENTIN) 875-125 MG tablet Take 1 tablet by mouth 2 (two) times daily. 28 tablet 0  . aspirin 325 MG EC tablet Take 325 mg by mouth daily.    Marland Kitchen atorvastatin (LIPITOR) 20 MG tablet Take 20 mg by mouth daily.    . benzonatate (TESSALON) 100 MG capsule Take by mouth 3 (  three) times daily as needed for cough.    . carvedilol (COREG) 6.25 MG tablet Take 6.25 mg by mouth 2 (two) times daily.     . collagenase (SANTYL) ointment Apply topically daily. 15 g 0  . dexamethasone (DECADRON) 4 MG tablet Take 4 mg by mouth once a week. Reported on 01/20/2015    . docusate sodium (COLACE) 100 MG capsule Take 100 mg by mouth 2 (two) times daily.    . fentaNYL (DURAGESIC - DOSED MCG/HR) 100 MCG/HR Place 1 patch (100 mcg total) onto the skin every other day. Pt uses in addition to a 60mg and 27m patch. 15 patch 0  . fentaNYL (DURAGESIC -  DOSED MCG/HR) 12 MCG/HR Place 1 patch (12.5 mcg total) onto the skin every other day. Pt uses in addition to a 2585mand 100m64match. 15 patch 0  . fentaNYL (DURAGESIC - DOSED MCG/HR) 25 MCG/HR patch Place 1 patch (25 mcg total) onto the skin every other day. Pt uses in addition to a 12mc56md 100mcg47mch. 15 patch 0  . Fluticasone-Salmeterol (ADVAIR) 100-50 MCG/DOSE AEPB Inhale 1 puff into the lungs 2 (two) times daily.    . furosemide (LASIX) 20 MG tablet Take 1 tablet (20 mg total) by mouth daily. 10 tablet 0  . gabapentin (NEURONTIN) 100 MG capsule Take 1 capsule (100 mg total) by mouth 3 (three) times daily. 90 capsule 3  . HYDROmorphone (DILAUDID) 2 MG tablet Take 1-2 tablets (2-4 mg total) by mouth every 4 (four) hours as needed for severe pain. 130 tablet 0  . ibuprofen (ADVIL,MOTRIN) 200 MG tablet Take 400 mg by mouth every 6 (six) hours as needed for fever.    . ipraMarland Kitchenropium-albuterol (DUONEB) 0.5-2.5 (3) MG/3ML SOLN Take 3 mLs by nebulization every 4 (four) hours as needed. 360 mL 3  . lenalidomide (REVLIMID) 25 MG capsule Take 25 mg by mouth daily. Reported on 01/20/2015    . lisinopril (PRINIVIL,ZESTRIL) 2.5 MG tablet Take 2.5 mg by mouth daily.    . polyethylene glycol (MIRALAX / GLYCOLAX) packet Take 17 g by mouth daily.    . promethazine (PHENERGAN) 25 MG tablet Take 1 tablet (25 mg total) by mouth every 4 (four) hours as needed for nausea or vomiting. 60 tablet 2  . silver sulfADIAZINE (SILVADENE) 1 % cream Apply topically daily. (Patient not taking: Reported on 01/20/2015) 50 g 0  . tiotropium (SPIRIVA) 18 MCG inhalation capsule Place 1 capsule (18 mcg total) into inhaler and inhale daily. 30 capsule 12   No current facility-administered medications for this visit.    PHYSICAL EXAMINATION: ECOG PERFORMANCE STATUS: 2 - Symptomatic, <50% confined to bed  There were no vitals taken for this visit.  There were no vitals filed for this visit.  GENERAL: Moderately nourished  moderately CAUCASIAN male patient. Alert, no distress and comfortable.  Accompanied by family.  EYES: no pallor or icterus OROPHARYNX: no thrush or ulceration; poor dentition. NECK: supple, no masses felt LYMPH:  no palpable lymphadenopathy in the cervical, axillary or inguinal regions LUNGS: With decreased bilateral breath sounds. No wheeze or crackles HEART/CVS: regular rate & rhythm and no murmurs; 1+ bilateral lower extremity edema. ABDOMEN:abdomen soft, non-tender and normal bowel sounds Musculoskeletal:no cyanosis of digits and no clubbing  PSYCH: alert & oriented x 3 with fluent speech NEURO: no focal motor/sensory deficits SKIN:  no rashes or significant lesions  LABORATORY DATA:  I have reviewed the data as listed    Component Value Date/Time  NA 136 01/20/2015 1329   NA 135 05/12/2014 1343   K 4.1 01/20/2015 1329   K 3.3* 05/12/2014 1343   CL 100* 01/20/2015 1329   CL 101 05/12/2014 1343   CO2 28 01/20/2015 1329   CO2 27 05/12/2014 1343   GLUCOSE 118* 01/20/2015 1329   GLUCOSE 77 05/12/2014 1343   BUN 11 01/20/2015 1329   BUN 9 05/12/2014 1343   CREATININE 0.88 01/20/2015 1329   CREATININE 0.89 05/12/2014 1343   CALCIUM 9.0 01/20/2015 1329   CALCIUM 8.8* 05/12/2014 1343   PROT 6.3* 01/06/2015 0943   PROT 5.9* 02/17/2014 1405   ALBUMIN 3.1* 01/06/2015 0943   ALBUMIN 2.8* 02/17/2014 1405   AST 13* 01/06/2015 0943   AST 9* 02/17/2014 1405   ALT 11* 01/06/2015 0943   ALT 12* 02/17/2014 1405   ALKPHOS 44 01/06/2015 0943   ALKPHOS 44* 02/17/2014 1405   BILITOT 0.3 01/06/2015 0943   BILITOT 0.2 02/17/2014 1405   GFRNONAA >60 01/20/2015 1329   GFRNONAA >60 05/12/2014 1343   GFRNONAA >60 02/17/2014 1405   GFRAA >60 01/20/2015 1329   GFRAA >60 05/12/2014 1343   GFRAA >60 02/17/2014 1405    No results found for: SPEP, UPEP  Lab Results  Component Value Date   WBC 6.9 01/27/2015   NEUTROABS 4.3 01/27/2015   HGB 12.7* 01/27/2015   HCT 38.9* 01/27/2015   MCV  95.6 01/27/2015   PLT 287 01/27/2015      Chemistry      Component Value Date/Time   NA 136 01/20/2015 1329   NA 135 05/12/2014 1343   K 4.1 01/20/2015 1329   K 3.3* 05/12/2014 1343   CL 100* 01/20/2015 1329   CL 101 05/12/2014 1343   CO2 28 01/20/2015 1329   CO2 27 05/12/2014 1343   BUN 11 01/20/2015 1329   BUN 9 05/12/2014 1343   CREATININE 0.88 01/20/2015 1329   CREATININE 0.89 05/12/2014 1343      Component Value Date/Time   CALCIUM 9.0 01/20/2015 1329   CALCIUM 8.8* 05/12/2014 1343   ALKPHOS 44 01/06/2015 0943   ALKPHOS 44* 02/17/2014 1405   AST 13* 01/06/2015 0943   AST 9* 02/17/2014 1405   ALT 11* 01/06/2015 0943   ALT 12* 02/17/2014 1405   BILITOT 0.3 01/06/2015 0943   BILITOT 0.2 02/17/2014 1405       RADIOGRAPHIC STUDIES: I have personally reviewed the radiological images as listed and agreed with the findings in the report. No results found.   ASSESSMENT & PLAN:   # MULTIPLE MYELOMA- status post induction Velcade Revlimid and dexamethasone with a very good partial response. Patient is currently on Revlimid and dexamethasone maintenance. This is currently on hold because of patient infectious issues/recent hospitalization. Most recent myeloma parameters appear stable with M spike of 0.5 g/dL and kappa to lambda free light chain ratio of 10. Patient should a off of Revlimid until resolution of infectious complications. Once antibiotics are finished, he will restart his Revlimid and dexamethasone.  # COPD/left lingular pneumonia- significant improvement on Augmentin. He will complete another week of Augmentin, and if still clinically stable, will restart Revlimid and dexamethasone.   # Left index finger cellulitis/ Left hip infection healing. Continue with wound care.  #  Swelling in the legs-improved on low-dose of Lasix.  # chronic pain/anxiety- on fentanyl and Dilaudid; Xanax. Medications refilled  #Bisphosphonates-creatinine is stable, administered  Zometa 1 week ago.  # He will return to our clinic in approximately  3 weeks. He'll have an M.D. visit, and Zometa infusion.  # 25 minutes face-to-face with the patient discussing the above plan of care; more than 50% of time spent on prognosis/ natural history; counseling and coordination.       Roxana Hires, MD 01/27/2015 10:14 AM

## 2015-01-27 NOTE — Addendum Note (Signed)
Addended by: Luella Cook on: 01/27/2015 11:24 AM   Modules accepted: Orders

## 2015-01-27 NOTE — Progress Notes (Signed)
Ambulates without assistance, accompanied by wife.  Patient denies pain states he's feeling nauseas because he took meds without food (says he was in a rush).  BP 114/75 Temp 96.6 Pulse 79.

## 2015-01-27 NOTE — Telephone Encounter (Signed)
Needs refills of dexamethasone and proair inhaler. Both was called into his pharmacy

## 2015-01-27 NOTE — Patient Instructions (Signed)
Pt. Instructed to complete antibiotics and after completion restart to revlimid 25 mg daily 3 weeks on and 1 week off, restart dexamethasone 4 mg tablets ( Take 10 tablets once weekly) while taking revlimid.

## 2015-01-28 ENCOUNTER — Telehealth: Payer: Self-pay | Admitting: *Deleted

## 2015-01-28 NOTE — Telephone Encounter (Signed)
[facility] called to confirm status on Revlimid RF. Explained to patient that patient should finish all of his antibiotics prior to starting Revlimid according to Dr. Leighton Ruff note from 01/27/15.  [facility] will go ahead and fax over a provider order. Pt most likely will finish his ABX next week.

## 2015-01-29 ENCOUNTER — Encounter: Payer: Medicaid Other | Attending: Surgery | Admitting: Surgery

## 2015-01-29 DIAGNOSIS — J449 Chronic obstructive pulmonary disease, unspecified: Secondary | ICD-10-CM | POA: Diagnosis not present

## 2015-01-29 DIAGNOSIS — I420 Dilated cardiomyopathy: Secondary | ICD-10-CM | POA: Insufficient documentation

## 2015-01-29 DIAGNOSIS — L89223 Pressure ulcer of left hip, stage 3: Secondary | ICD-10-CM | POA: Diagnosis present

## 2015-01-29 DIAGNOSIS — F17210 Nicotine dependence, cigarettes, uncomplicated: Secondary | ICD-10-CM | POA: Diagnosis not present

## 2015-01-29 DIAGNOSIS — E441 Mild protein-calorie malnutrition: Secondary | ICD-10-CM | POA: Diagnosis not present

## 2015-01-29 DIAGNOSIS — D649 Anemia, unspecified: Secondary | ICD-10-CM | POA: Diagnosis not present

## 2015-01-29 DIAGNOSIS — I2510 Atherosclerotic heart disease of native coronary artery without angina pectoris: Secondary | ICD-10-CM | POA: Insufficient documentation

## 2015-01-29 DIAGNOSIS — S61210A Laceration without foreign body of right index finger without damage to nail, initial encounter: Secondary | ICD-10-CM | POA: Insufficient documentation

## 2015-01-29 DIAGNOSIS — F17218 Nicotine dependence, cigarettes, with other nicotine-induced disorders: Secondary | ICD-10-CM | POA: Diagnosis not present

## 2015-01-29 DIAGNOSIS — G629 Polyneuropathy, unspecified: Secondary | ICD-10-CM | POA: Insufficient documentation

## 2015-01-29 DIAGNOSIS — J45909 Unspecified asthma, uncomplicated: Secondary | ICD-10-CM | POA: Diagnosis not present

## 2015-01-29 DIAGNOSIS — C9000 Multiple myeloma not having achieved remission: Secondary | ICD-10-CM | POA: Diagnosis not present

## 2015-01-30 NOTE — Progress Notes (Signed)
[name] Visit Report for 01/29/2015 Arrival Information Details Patient Name: [name], [name] Date of Service: 01/29/2015 10:45 AM Medical Record Number: [id number] Patient Account Number: [id number] Date of Birth/Sex: March 10, 1960 (55 y.o. Male) Treating RN: Baruch Gouty, RN, BSN, Velva Harman Primary Care Physician: Sena Hitch Other Clinician: Referring Physician: Sena Hitch Treating Physician/Extender: Frann Rider in Treatment: 12 Visit Information History Since Last Visit Added or deleted any medications: No Patient Arrived: Ambulatory Any new allergies or adverse reactions: No Arrival Time: 10:29 Had a fall or experienced change in No Accompanied By: wife activities of daily living that may affect Transfer Assistance: None risk of falls: Patient Identification Verified: Yes Signs or symptoms of abuse/neglect since last No Secondary Verification Process Yes visito Completed: Hospitalized since last visit: No Patient Requires Transmission-Based No Has Dressing in Place as Prescribed: Yes Precautions: Pain Present Now: No Patient Has Alerts: No Electronic Signature(s) Signed: 01/29/2015 4:57:48 PM By: Regan Lemming BSN, RN Entered By: Regan Lemming on 01/29/2015 10:32:41 Moulin, [name] ([id number]) -------------------------------------------------------------------------------- Encounter Discharge Information Details Patient Name: [name] Date of Service: 01/29/2015 10:45 AM Medical Record Number: [id number] Patient Account Number: [id number] Date of Birth/Sex: 1960/01/26 (55 y.o. Male) Treating RN: Baruch Gouty, RN, BSN, [place] Primary Care Physician: Sena Hitch Other Clinician: Referring Physician: Sena Hitch Treating Physician/Extender: Frann Rider in Treatment: 12 Encounter Discharge Information Items Discharge Pain Level: 0 Discharge Condition: Stable Ambulatory Status: Ambulatory Discharge Destination: Home Transportation:  Private Auto Accompanied By: wife Schedule Follow-up Appointment: No Medication Reconciliation completed and provided to Patient/Care No Paislei Dorval: Provided on Clinical Summary of Care: 01/29/2015 Form Type Recipient Paper Patient RM Electronic Signature(s) Signed: 01/29/2015 4:57:48 PM By: Regan Lemming BSN, RN Previous Signature: 01/29/2015 10:44:11 AM Version By: Ruthine Dose Entered By: Regan Lemming on 01/29/2015 10:46:00 Yager, [name] ([id number]) -------------------------------------------------------------------------------- General Visit Notes Details Patient Name: [name] Date of Service: 01/29/2015 10:45 AM Medical Record Number: [id number] Patient Account Number: [id number] Date of Birth/Sex: 03/11/1960 (55 y.o. Male) Treating RN: Baruch Gouty, RN, BSN, [place] Primary Care Physician: Sena Hitch Other Clinician: Referring Physician: Sena Hitch Treating Physician/Extender: Frann Rider in Treatment: 12 Notes Alleyne bordered foam applied to left hip. Electronic Signature(s) Signed: 01/29/2015 4:57:48 PM By: Regan Lemming BSN, RN Entered By: Regan Lemming on 01/29/2015 10:47:12 Krage, [name] ([id number]) -------------------------------------------------------------------------------- Lower Extremity Assessment Details Patient Name: [name] Date of Service: 01/29/2015 10:45 AM Medical Record Number: [id number] Patient Account Number: [id number] Date of Birth/Sex: 1960/04/17 (55 y.o. Male) Treating RN: Baruch Gouty, RN, BSN, [place] Harman Primary Care Physician: Sena Hitch Other Clinician: Referring Physician: Sena Hitch Treating Physician/Extender: Frann Rider in Treatment: 12 Electronic Signature(s) Signed: 01/29/2015 4:57:48 PM By: Regan Lemming BSN, RN Entered By: Regan Lemming on 01/29/2015 10:33:14 Kunert, [name] ([id number]) -------------------------------------------------------------------------------- [place]  Details Patient Name: [name] Date of Service: 01/29/2015 10:45 AM Medical Record Number: [id number] Patient Account Number: [id number] Date of Birth/Sex: Jan 02, 1961 (55 y.o. Male) Treating RN: Baruch Gouty, RN, BSN, [place] Primary Care Physician: Sena Hitch Other Clinician: Referring Physician: Sena Hitch Treating Physician/Extender: Frann Rider in Treatment: 12 Active Inactive Electronic Signature(s) Signed: 01/29/2015 4:57:48 PM By: Regan Lemming BSN, RN Entered By: Regan Lemming on 01/29/2015 10:42:36 Pourciau, [name] ([id number]) -------------------------------------------------------------------------------- Pain Assessment Details Patient Name: [name] Date of Service: 01/29/2015 10:45 AM Medical Record Number: [id number] Patient Account Number: [id number] Date of Birth/Sex: [birth year] (55 y.o. Male) Treating RN: Baruch Gouty, RN, BSN, [place] Primary Care Physician: Sena Hitch Other  Clinician: Referring Physician: Sena Hitch Treating Physician/Extender: Frann Rider in Treatment: 12 Active Problems Location of Pain Severity and Description of Pain Patient Has Paino No Site Locations Pain Management and Medication Current Pain Management: Electronic Signature(s) Signed: 01/29/2015 4:57:48 PM By: Regan Lemming BSN, RN Entered By: Regan Lemming on 01/29/2015 10:32:49 Kardell, [name] ([id number]) -------------------------------------------------------------------------------- Patient/Caregiver Education Details Patient Name: [name] Date of Service: 01/29/2015 10:45 AM Medical Record Number: [id number] Patient Account Number: [id number] Date of Birth/Gender: [birth year] (55 y.o. Male) Treating RN: Baruch Gouty, RN, BSN, [place] Primary Care Physician: Sena Hitch Other Clinician: Referring Physician: Sena Hitch Treating Physician/Extender: Frann Rider in Treatment: 12 Education Assessment Education Provided  To: Patient Education Topics Provided Electronic Signature(s) Signed: 01/29/2015 4:57:48 PM By: Regan Lemming BSN, RN Entered By: Regan Lemming on 01/29/2015 10:46:14 Stthomas, [name] ([id number]) -------------------------------------------------------------------------------- Wound Assessment Details Patient Name: [name] Date of Service: 01/29/2015 10:45 AM Medical Record Number: [id number] Patient Account Number: [id number] Date of Birth/Sex: November 22, 1960 (55 y.o. Male) Treating RN: Baruch Gouty, RN, BSN, [place] Primary Care Physician: Sena Hitch Other Clinician: Referring Physician: Sena Hitch Treating Physician/Extender: Frann Rider in Treatment: 12 Wound Status Wound Number: 1 Primary Malignant Wound Etiology: Wound Location: Left Ischial Tuberosity Wound Healed - Epithelialized Wounding Event: Gradually Appeared Status: Date Acquired: 10/19/2014 Comorbid Chronic Obstructive Pulmonary Weeks Of Treatment: 12 History: Disease (COPD), Arrhythmia, Clustered Wound: No Hypertension, Received Radiation Photos Photo Uploaded By: Regan Lemming on 01/29/2015 14:35:01 Wound Measurements Length: (cm) Width: (cm) Depth: (cm) Area: (cm) Volume: (cm) 0 % Reduction in Area: 100% 0 % Reduction in Volume: 100% 0 Epithelialization: None 0 Tunneling: No 0 Wound Description Classification: Partial Thickness Wound Margin: Distinct, outline attached Exudate Amount: None Present Foul Odor After Cleansing: No Wound Bed Granulation Amount: None Present (0%) Exposed Structure Necrotic Amount: None Present (0%) Fascia Exposed: No Fat Layer Exposed: No Tendon Exposed: No Muscle Exposed: No Joint Exposed: No Norton, Coda A. ([id number]) Bone Exposed: No Limited to Skin Breakdown Periwound Skin Texture Texture Color No Abnormalities Noted: No No Abnormalities Noted: No Callus: No Atrophie Blanche: No Crepitus: No Cyanosis: No Excoriation: No Ecchymosis:  No Fluctuance: No Erythema: No Friable: No Hemosiderin Staining: No Induration: No Mottled: No Localized Edema: No Pallor: No Rash: No Rubor: No Scarring: No Temperature / Pain Moisture Temperature: No Abnormality No Abnormalities Noted: No Tenderness on Palpation: Yes Dry / Scaly: No Maceration: No Moist: No Wound Preparation Ulcer Cleansing: Rinsed/Irrigated with Saline Topical Anesthetic Applied: None Electronic Signature(s) Signed: 01/29/2015 4:57:48 PM By: Regan Lemming BSN, RN Entered By: Regan Lemming on 01/29/2015 10:35:31 Maisano, [name] ([id number]) -------------------------------------------------------------------------------- Wound Assessment Details Patient Name: [name] Date of Service: 01/29/2015 10:45 AM Medical Record Number: [id number] Patient Account Number: [id number] Date of Birth/Sex: 1960-03-27 (55 y.o. Male) Treating RN: Baruch Gouty, RN, BSN, [place] Primary Care Physician: Sena Hitch Other Clinician: Referring Physician: Sena Hitch Treating Physician/Extender: Frann Rider in Treatment: 12 Wound Status Wound Number: 3 Primary To be determined Etiology: Wound Location: Left Upper Leg - Lateral Wound Healed - Epithelialized Wounding Event: Gradually Appeared Status: Date Acquired: 01/08/2015 Comorbid Chronic Obstructive Pulmonary Weeks Of Treatment: 1 History: Disease (COPD), Arrhythmia, Clustered Wound: No Hypertension, Received Radiation Photos Photo Uploaded By: Regan Lemming on 01/29/2015 14:35:01 Wound Measurements Length: (cm) 0 % Reduction i Width: (cm) 0 % Reduction i Depth: (cm) 0 Epithelializa Area: (cm) 0 Tunneling: Volume: (cm) 0 n Area: 100% n Volume: 100% tion: None No Wound Description Classification: Partial  Thickness Wound Margin: Flat and Intact Exudate Amount: None Present Foul Odor After Cleansing: No Wound Bed Granulation Amount: None Present (0%) Exposed Structure Necrotic Amount: None  Present (0%) Fascia Exposed: No Fat Layer Exposed: No Tendon Exposed: No Muscle Exposed: No Joint Exposed: No Feister, Gilad A. ([id number]) Bone Exposed: No Limited to Skin Breakdown Periwound Skin Texture Texture Color No Abnormalities Noted: No No Abnormalities Noted: No Callus: No Atrophie Blanche: No Crepitus: No Cyanosis: No Excoriation: No Ecchymosis: No Fluctuance: No Erythema: No Friable: No Hemosiderin Staining: No Induration: No Mottled: No Localized Edema: No Pallor: No Rash: No Rubor: No Scarring: No Temperature / Pain Moisture Temperature: No Abnormality No Abnormalities Noted: No Dry / Scaly: Yes Maceration: No Moist: No Wound Preparation Ulcer Cleansing: Rinsed/Irrigated with Saline Topical Anesthetic Applied: None Electronic Signature(s) Signed: 01/29/2015 4:57:48 PM By: Regan Lemming BSN, RN Entered By: Regan Lemming on 01/29/2015 10:35:52 Carrillo, [name] ([id number]) -------------------------------------------------------------------------------- Vitals Details Patient Name: [name] Date of Service: 01/29/2015 10:45 AM Medical Record Number: [id number] Patient Account Number: [id number] Date of Birth/Sex: 03-07-1960 (55 y.o. Male) Treating RN: Baruch Gouty, RN, BSN, [place] Primary Care Physician: Sena Hitch Other Clinician: Referring Physician: Sena Hitch Treating Physician/Extender: Frann Rider in Treatment: 12 Vital Signs Time Taken: 10:33 Temperature (F): 97.8 Height (in): 73 Pulse (bpm): 81 Weight (lbs): 132 Respiratory Rate (breaths/min): 20 Body Mass Index (BMI): 17.4 Blood Pressure (mmHg): 90/53 Reference Range: 80 - 120 mg / dl Electronic Signature(s) Signed: 01/29/2015 4:57:48 PM By: Regan Lemming BSN, RN Entered By: Regan Lemming on 01/29/2015 10:33:09

## 2015-01-30 NOTE — Progress Notes (Addendum)
MOIZ, RYANT ([id number]) Visit Report for 01/29/2015 Chief Complaint Document Details Patient Name: HARRIET, BOLLEN Date of Service: 01/29/2015 10:45 AM Medical Record Number: [id number] Patient Account Number: [id number] Date of Birth/Sex: [birth year] (56 y.o. Male) Treating RN: Baruch Gouty, RN, BSN, Velva Harman Primary Care Physician: Sena Hitch Other Clinician: Referring Physician: Sena Hitch Treating Physician/Extender: Frann Rider in Treatment: 12 Information Obtained from: Patient Chief Complaint Patient is at the clinic for treatment of an open pressure ulcer. 55 year old gentleman with a ulcerated area on his left hip she's had for 3 weeks. Electronic Signature(s) Signed: 01/29/2015 10:42:59 AM By: Christin Fudge MD, FACS Entered By: Christin Fudge on 01/29/2015 10:42:59 Swader, [name] ([id number]) -------------------------------------------------------------------------------- HPI Details Patient Name: [name] Date of Service: 01/29/2015 10:45 AM Medical Record Number: [id number] Patient Account Number: [id number] Date of Birth/Sex: Mar 25, 1960 (55 y.o. Male) Treating RN: Baruch Gouty, RN, BSN, Velva Harman Primary Care Physician: Sena Hitch Other Clinician: Referring Physician: Sena Hitch Treating Physician/Extender: Frann Rider in Treatment: 12 History of Present Illness Location: left hip area Quality: Patient reports experiencing a dull pain to affected area(s). Severity: Patient states wound are getting worse. Duration: Patient has had the wound for < 3 weeks prior to presenting for treatment Timing: Pain in wound is Intermittent (comes and goes Context: The wound appeared gradually over time Modifying Factors: Other treatment(s) tried include: local dressing changes with antibiotic cream Associated Signs and Symptoms: Patient reports having difficulty standing for long periods. HPI Description: The patient is under treatment with medical oncology  Dr. Ma Hillock sees him, for multiple myeloma, anemia, COPD, peripheral neuropathy, pedal edema and has been referred to [place] for left lateral hip pressure ulcer. his past medical history significant for coronary artery disease, dilated cardiomyopathy with an EF of 20% and has had her AICD placement, kidney stones, asthma, COPD, polysubstance abuse, anxiety. continues to smoke a pack of cigarettes a day, history of recreational drug usage and used to take IV drugs in the late 1980s. He was in bed for a long while due to an associated illness and lay on his left side for prolonged periods of time. He has been ambulatory otherwise and has a fairly poor nutrition. 01/01/2015 -- I have reviewed the notes whether the patient was recently admitted to the hospital between November 30 and 12/27/2014. He was admitted for cellulitis of the left second finger, COPD exacerbation and hypotension and was found that no surgical debridement was required after a surgical consult by Dr. Adonis Huguenin. he was treated with injectable vancomycin and Zosyn initially and then sent home on oral Augmentin. Electronic Signature(s) Signed: 01/29/2015 10:43:07 AM By: Christin Fudge MD, FACS Entered By: Christin Fudge on 01/29/2015 10:43:07 [name] ([id number]) -------------------------------------------------------------------------------- Physical Exam Details Patient Name: [name] Date of Service: 01/29/2015 10:45 AM Medical Record Number: [id number] Patient Account Number: [id number] Date of Birth/Sex: [birth year] (55 y.o. Male) Treating RN: Baruch Gouty, RN, BSN, Velva Harman Primary Care Physician: Sena Hitch Other Clinician: Referring Physician: Sena Hitch Treating Physician/Extender: Frann Rider in Treatment: 12 Constitutional . Pulse regular. Respirations normal and unlabored. Afebrile. . Eyes Nonicteric. Reactive to light. Ears, Nose, Mouth, and Throat Lips, teeth, and gums WNL.Marland Kitchen Moist mucosa  without lesions. Neck supple and nontender. No palpable supraclavicular or cervical adenopathy. Normal sized without goiter. Respiratory WNL. No retractions.. Cardiovascular Pedal Pulses WNL. No clubbing, cyanosis or edema. Chest Breasts symmetical and no nipple discharge.. Breast tissue WNL, no masses, lumps, or tenderness.. Lymphatic No adneopathy. No adenopathy. No adenopathy. Musculoskeletal  Adexa without tenderness or enlargement.. Digits and nails w/o clubbing, cyanosis, infection, petechiae, ischemia, or inflammatory conditions.. Integumentary (Hair, Skin) No suspicious lesions. No crepitus or fluctuance. No peri-wound warmth or erythema. No masses.Marland Kitchen Psychiatric Judgement and insight Intact.. No evidence of depression, anxiety, or agitation.. Notes excellent resolution to all his problems and his left hip wound is completely healed with normal skin. The left ring finger is also completely healed and he has no open wounds Electronic Signature(s) Signed: 01/29/2015 10:43:46 AM By: Christin Fudge MD, FACS Entered By: Christin Fudge on 01/29/2015 10:43:46 Keng, [name] ([id number]) -------------------------------------------------------------------------------- Physician Orders Details Patient Name: [name] Date of Service: 01/29/2015 10:45 AM Medical Record Number: [id number] Patient Account Number: [id number] Date of Birth/Sex: 27-Mar-1960 (55 y.o. Male) Treating RN: Baruch Gouty, RN, BSN, [place] Primary Care Physician: Sena Hitch Other Clinician: Referring Physician: Sena Hitch Treating Physician/Extender: Frann Rider in Treatment: 12 Verbal / Phone Orders: Yes Clinician: Afful, RN, BSN, Rita Read Back and Verified: Yes Diagnosis Coding Discharge From [facility] Services o Discharge from [place] Completed. Instructed by MD to keep left heel offloaded. Verbalized understanding. Electronic Signature(s) Signed: 01/29/2015 4:57:48 PM By:  Regan Lemming BSN, RN Signed: 01/29/2015 5:05:49 PM By: Christin Fudge MD, FACS Entered By: Regan Lemming on 01/29/2015 10:45:14 Skelton, [name] ([id number]) -------------------------------------------------------------------------------- Problem List Details Patient Name: [name] Date of Service: 01/29/2015 10:45 AM Medical Record Number: [id number] Patient Account Number: [id number] Date of Birth/Sex: 05/01/1960 (54 y.o. Male) Treating RN: Baruch Gouty, RN, BSN, [place] Primary Care Physician: Sena Hitch Other Clinician: Referring Physician: Sena Hitch Treating Physician/Extender: Frann Rider in Treatment: 12 Active Problems ICD-10 Encounter Code Description Active Date Diagnosis [phone] Pressure ulcer of left hip, stage 3 11/05/2014 Yes C90.00 Multiple myeloma not having achieved remission 11/05/2014 Yes F17.218 Nicotine dependence, cigarettes, with other nicotine- 11/05/2014 Yes induced disorders E44.1 Mild protein-calorie malnutrition 11/05/2014 Yes S61.210A Laceration without foreign body of right index finger 01/01/2015 Yes without damage to nail, initial encounter Inactive Problems Resolved Problems Electronic Signature(s) Signed: 01/29/2015 10:42:50 AM By: Christin Fudge MD, FACS Entered By: Christin Fudge on 01/29/2015 10:42:50 Nawaz, [name] ([id number]) -------------------------------------------------------------------------------- Progress Note Details Patient Name: [name] Date of Service: 01/29/2015 10:45 AM Medical Record Number: [id number] Patient Account Number: [id number] Date of Birth/Sex: 06/06/1960 (55 y.o. Male) Treating RN: Baruch Gouty, RN, BSN, [place] Primary Care Physician: Sena Hitch Other Clinician: Referring Physician: Sena Hitch Treating Physician/Extender: Frann Rider in Treatment: 12 Subjective Chief Complaint Information obtained from Patient Patient is at the clinic for treatment of an open pressure ulcer.  55 year old gentleman with a ulcerated area on his left hip she's had for 3 weeks. History of Present Illness (HPI) The following HPI elements were documented for the patient's wound: Location: left hip area Quality: Patient reports experiencing a dull pain to affected area(s). Severity: Patient states wound are getting worse. Duration: Patient has had the wound for < 3 weeks prior to presenting for treatment Timing: Pain in wound is Intermittent (comes and goes Context: The wound appeared gradually over time Modifying Factors: Other treatment(s) tried include: local dressing changes with antibiotic cream Associated Signs and Symptoms: Patient reports having difficulty standing for long periods. The patient is under treatment with medical oncology Dr. Ma Hillock sees him, for multiple myeloma, anemia, COPD, peripheral neuropathy, pedal edema and has been referred to [place] for left lateral hip pressure ulcer. his past medical history significant for coronary artery disease, dilated cardiomyopathy with an EF of 20%  and has had her AICD placement, kidney stones, asthma, COPD, polysubstance abuse, anxiety. continues to smoke a pack of cigarettes a day, history of recreational drug usage and used to take IV drugs in the late 1980s. He was in bed for a long while due to an associated illness and lay on his left side for prolonged periods of time. He has been ambulatory otherwise and has a fairly poor nutrition. 01/01/2015 -- I have reviewed the notes whether the patient was recently admitted to the hospital between November 30 and 12/27/2014. He was admitted for cellulitis of the left second finger, COPD exacerbation and hypotension and was found that no surgical debridement was required after a surgical consult by Dr. Adonis Huguenin. he was treated with injectable vancomycin and Zosyn initially and then sent home on oral Augmentin. Objective Thiemann, Ngoc A. ([id number]) Constitutional Pulse regular.  Respirations normal and unlabored. Afebrile. Vitals Time Taken: 10:33 AM, Height: 73 in, Weight: 132 lbs, BMI: 17.4, Temperature: 97.8 F, Pulse: 81 bpm, Respiratory Rate: 20 breaths/min, Blood Pressure: 90/53 mmHg. Eyes Nonicteric. Reactive to light. Ears, Nose, Mouth, and Throat Lips, teeth, and gums WNL.Marland Kitchen Moist mucosa without lesions. Neck supple and nontender. No palpable supraclavicular or cervical adenopathy. Normal sized without goiter. Respiratory WNL. No retractions.. Cardiovascular Pedal Pulses WNL. No clubbing, cyanosis or edema. Chest Breasts symmetical and no nipple discharge.. Breast tissue WNL, no masses, lumps, or tenderness.. Lymphatic No adneopathy. No adenopathy. No adenopathy. Musculoskeletal Adexa without tenderness or enlargement.. Digits and nails w/o clubbing, cyanosis, infection, petechiae, ischemia, or inflammatory conditions.Marland Kitchen Psychiatric Judgement and insight Intact.. No evidence of depression, anxiety, or agitation.. General Notes: excellent resolution to all his problems and his left hip wound is completely healed with normal skin. The left ring finger is also completely healed and he has no open wounds Integumentary (Hair, Skin) No suspicious lesions. No crepitus or fluctuance. No peri-wound warmth or erythema. No masses.. Wound #1 status is Healed - Epithelialized. Original cause of wound was Gradually Appeared. The wound is located on the Left Ischial Tuberosity. The wound measures 0cm length x 0cm width x 0cm depth; 0cm^2 area and 0cm^3 volume. The wound is limited to skin breakdown. There is no tunneling noted. There is a none present amount of drainage noted. The wound margin is distinct with the outline attached to the wound base. There is no granulation within the wound bed. There is no necrotic tissue within the wound bed. The periwound skin appearance did not exhibit: Callus, Crepitus, Excoriation, Fluctuance, Friable, Induration, Localized  Edema, Rash, Scarring, Dry/Scaly, Maceration, Moist, Atrophie Blanche, Cyanosis, Ecchymosis, Hemosiderin Staining, Mottled, Pallor, Rubor, Erythema. Periwound temperature was noted as Moncayo, Stevon A. ([id number]) No Abnormality. The periwound has tenderness on palpation. Wound #3 status is Healed - Epithelialized. Original cause of wound was Gradually Appeared. The wound is located on the Left,Lateral Upper Leg. The wound measures 0cm length x 0cm width x 0cm depth; 0cm^2 area and 0cm^3 volume. The wound is limited to skin breakdown. There is no tunneling noted. There is a none present amount of drainage noted. The wound margin is flat and intact. There is no granulation within the wound bed. There is no necrotic tissue within the wound bed. The periwound skin appearance exhibited: Dry/Scaly. The periwound skin appearance did not exhibit: Callus, Crepitus, Excoriation, Fluctuance, Friable, Induration, Localized Edema, Rash, Scarring, Maceration, Moist, Atrophie Blanche, Cyanosis, Ecchymosis, Hemosiderin Staining, Mottled, Pallor, Rubor, Erythema. Periwound temperature was noted as No Abnormality. Assessment Active Problems ICD-10 Q19.758 - Pressure ulcer  of left hip, stage 3 C90.00 - Multiple myeloma not having achieved remission F17.218 - Nicotine dependence, cigarettes, with other nicotine-induced disorders E44.1 - Mild protein-calorie malnutrition S61.210A - Laceration without foreign body of right index finger without damage to nail, initial encounter Having completely healed out all his wounds I have discharged him from the wound care center with the strict instructions to offload the area completely, quit smoking completely and keep a close watch on his pressure points. He will come back to see [place] as needed Plan Discharge From [facility] Services: Discharge from [place] Completed. Instructed by MD to keep left heel offloaded. Verbalized understanding. Ludolph, Burnell A.  ([id number]) Having completely healed out all his wounds I have discharged him from the wound care center with the strict instructions to offload the area completely, quit smoking completely and keep a close watch on his pressure points. He will come back to see [place] as needed Electronic Signature(s) Signed: 01/29/2015 5:02:41 PM By: Christin Fudge MD, FACS Previous Signature: 01/29/2015 10:44:33 AM Version By: Christin Fudge MD, FACS Entered By: Christin Fudge on 01/29/2015 17:02:41 Johndrow, [name] ([id number]) -------------------------------------------------------------------------------- SuperBill Details Patient Name: [name] Date of Service: 01/29/2015 Medical Record Number: [id number] Patient Account Number: [id number] Date of Birth/Sex: Sep 29, 1960 (55 y.o. Male) Treating RN: Baruch Gouty, RN, BSN, [place] Primary Care Physician: Sena Hitch Other Clinician: Referring Physician: Sena Hitch Treating Physician/Extender: Frann Rider in Treatment: 12 Diagnosis Coding ICD-10 Codes Code Description [id number] Pressure ulcer of left hip, stage 3 C90.00 Multiple myeloma not having achieved remission F17.218 Nicotine dependence, cigarettes, with other nicotine-induced disorders E44.1 Mild protein-calorie malnutrition Laceration without foreign body of right index finger without damage to nail, initial S61.210A encounter Physician Procedures CPT4: Description Modifier Quantity Code [id number] - WC PHYS LEVEL 2 - EST PT 1 ICD-10 Description Diagnosis L89.223 Pressure ulcer of left hip, stage 3 S61.210A Laceration without foreign body of right index finger without damage to nail, initial  encounter E44.1 Mild protein-calorie malnutrition Electronic Signature(s) Signed: 01/29/2015 10:45:02 AM By: Christin Fudge MD, FACS Entered By: Christin Fudge on 01/29/2015 10:45:02

## 2015-02-04 ENCOUNTER — Telehealth: Payer: Self-pay | Admitting: *Deleted

## 2015-02-04 NOTE — Telephone Encounter (Signed)
RX for revlimid dose was sent to pharmacy on Tuesday 02/03/15 this week and received fax confirmation.  I can contact [facility] to ensure that the drug is being shipped. He already has his decadron rx sent to pharmacy. He is to take these tablets together as md ordered. Patient may need to do his patient survey?

## 2015-02-04 NOTE — Telephone Encounter (Signed)
Discussed with pt that he may need to do his survey and that rx was sent in on Tuesday. He confirms that he does have the Decadrin. He said he will call [employer]

## 2015-02-04 NOTE — Telephone Encounter (Signed)
He completed his abx today and is asking when he is going to get his Revlimid and Dex prescriptions.

## 2015-02-05 NOTE — Telephone Encounter (Signed)
RN contacted [facility]. [facility] spoke with patient yesterday. Drug shipped in early am on Thursday and should arrive at pt's residence today for patient to take.

## 2015-02-08 ENCOUNTER — Telehealth: Payer: Self-pay | Admitting: *Deleted

## 2015-02-08 DIAGNOSIS — F419 Anxiety disorder, unspecified: Secondary | ICD-10-CM

## 2015-02-08 DIAGNOSIS — J449 Chronic obstructive pulmonary disease, unspecified: Secondary | ICD-10-CM

## 2015-02-08 DIAGNOSIS — C9000 Multiple myeloma not having achieved remission: Secondary | ICD-10-CM

## 2015-02-08 MED ORDER — ALPRAZOLAM 0.5 MG PO TABS: 0.5000 mg | ORAL_TABLET | Freq: Three times a day (TID) | ORAL | Status: DC | PRN

## 2015-02-08 MED ORDER — HYDROMORPHONE HCL 2 MG PO TABS: 2.0000 mg | ORAL_TABLET | ORAL | Status: DC | PRN

## 2015-02-08 NOTE — Telephone Encounter (Signed)
Informed that prescription is ready to pick up  

## 2015-02-09 ENCOUNTER — Telehealth: Payer: Self-pay | Admitting: *Deleted

## 2015-02-09 DIAGNOSIS — C9000 Multiple myeloma not having achieved remission: Secondary | ICD-10-CM

## 2015-02-09 MED ORDER — GABAPENTIN 100 MG PO CAPS: 100.0000 mg | ORAL_CAPSULE | Freq: Three times a day (TID) | ORAL | Status: DC

## 2015-02-09 NOTE — Telephone Encounter (Signed)
E cribed

## 2015-02-22 ENCOUNTER — Telehealth: Payer: Self-pay | Admitting: *Deleted

## 2015-02-22 DIAGNOSIS — F419 Anxiety disorder, unspecified: Secondary | ICD-10-CM

## 2015-02-22 DIAGNOSIS — J449 Chronic obstructive pulmonary disease, unspecified: Secondary | ICD-10-CM

## 2015-02-22 DIAGNOSIS — C9000 Multiple myeloma not having achieved remission: Secondary | ICD-10-CM

## 2015-02-22 MED ORDER — FENTANYL 12 MCG/HR TD PT72: 12.0000 ug | MEDICATED_PATCH | TRANSDERMAL | Status: DC

## 2015-02-22 MED ORDER — FENTANYL 100 MCG/HR TD PT72: 100.0000 ug | MEDICATED_PATCH | TRANSDERMAL | Status: DC

## 2015-02-22 MED ORDER — FENTANYL 25 MCG/HR TD PT72: 25.0000 ug | MEDICATED_PATCH | TRANSDERMAL | Status: DC

## 2015-02-22 NOTE — Telephone Encounter (Signed)
Informed that prescription is ready to pick up  

## 2015-02-24 ENCOUNTER — Encounter: Payer: Self-pay | Admitting: Internal Medicine

## 2015-02-24 ENCOUNTER — Inpatient Hospital Stay: Payer: Medicaid Other | Attending: Internal Medicine

## 2015-02-24 ENCOUNTER — Inpatient Hospital Stay (HOSPITAL_BASED_OUTPATIENT_CLINIC_OR_DEPARTMENT_OTHER): Payer: Medicaid Other | Admitting: Internal Medicine

## 2015-02-24 ENCOUNTER — Ambulatory Visit: Payer: Medicaid Other

## 2015-02-24 ENCOUNTER — Inpatient Hospital Stay: Payer: Medicaid Other

## 2015-02-24 VITALS — BP 106/69 | HR 70 | Temp 97.1°F | Resp 18 | Ht 72.0 in | Wt 135.6 lb

## 2015-02-24 DIAGNOSIS — Z7982 Long term (current) use of aspirin: Secondary | ICD-10-CM

## 2015-02-24 DIAGNOSIS — Z9221 Personal history of antineoplastic chemotherapy: Secondary | ICD-10-CM

## 2015-02-24 DIAGNOSIS — J449 Chronic obstructive pulmonary disease, unspecified: Secondary | ICD-10-CM

## 2015-02-24 DIAGNOSIS — Z79899 Other long term (current) drug therapy: Secondary | ICD-10-CM | POA: Insufficient documentation

## 2015-02-24 DIAGNOSIS — K219 Gastro-esophageal reflux disease without esophagitis: Secondary | ICD-10-CM | POA: Insufficient documentation

## 2015-02-24 DIAGNOSIS — G8929 Other chronic pain: Secondary | ICD-10-CM | POA: Insufficient documentation

## 2015-02-24 DIAGNOSIS — I2510 Atherosclerotic heart disease of native coronary artery without angina pectoris: Secondary | ICD-10-CM | POA: Diagnosis not present

## 2015-02-24 DIAGNOSIS — F419 Anxiety disorder, unspecified: Secondary | ICD-10-CM | POA: Insufficient documentation

## 2015-02-24 DIAGNOSIS — F17210 Nicotine dependence, cigarettes, uncomplicated: Secondary | ICD-10-CM | POA: Insufficient documentation

## 2015-02-24 DIAGNOSIS — C9000 Multiple myeloma not having achieved remission: Secondary | ICD-10-CM

## 2015-02-24 DIAGNOSIS — I252 Old myocardial infarction: Secondary | ICD-10-CM | POA: Insufficient documentation

## 2015-02-24 DIAGNOSIS — E785 Hyperlipidemia, unspecified: Secondary | ICD-10-CM

## 2015-02-24 DIAGNOSIS — M549 Dorsalgia, unspecified: Secondary | ICD-10-CM

## 2015-02-24 LAB — COMPREHENSIVE METABOLIC PANEL
ALBUMIN: 3.5 g/dL (ref 3.5–5.0)
ALK PHOS: 33 U/L — AB (ref 38–126)
ALT: 17 U/L (ref 17–63)
AST: 12 U/L — ABNORMAL LOW (ref 15–41)
Anion gap: 5 (ref 5–15)
BUN: 10 mg/dL (ref 6–20)
CALCIUM: 8.4 mg/dL — AB (ref 8.9–10.3)
CHLORIDE: 100 mmol/L — AB (ref 101–111)
CO2: 31 mmol/L (ref 22–32)
CREATININE: 0.78 mg/dL (ref 0.61–1.24)
GFR calc non Af Amer: >60 mL/min (ref >60)
GLUCOSE: 91 mg/dL (ref 65–99)
Potassium: 3.2 mmol/L — ABNORMAL LOW (ref 3.5–5.1)
SODIUM: 136 mmol/L (ref 135–145)
Total Bilirubin: 0.4 mg/dL (ref 0.3–1.2)
Total Protein: 6.6 g/dL (ref 6.5–8.1)

## 2015-02-24 LAB — CBC WITH DIFFERENTIAL/PLATELET
BASOS ABS: 0.1 10*3/uL (ref 0–0.1)
BASOS PCT: 1 %
EOS ABS: 0.3 10*3/uL (ref 0–0.7)
EOS PCT: 6 %
HCT: 41.1 % (ref 40.0–52.0)
HEMOGLOBIN: 13.8 g/dL (ref 13.0–18.0)
LYMPHS ABS: 1.4 10*3/uL (ref 1.0–3.6)
Lymphocytes Relative: 25 %
MCH: 31.4 pg (ref 26.0–34.0)
MCHC: 33.5 g/dL (ref 32.0–36.0)
MCV: 93.8 fL (ref 80.0–100.0)
Monocytes Absolute: 0.6 10*3/uL (ref 0.2–1.0)
Monocytes Relative: 10 %
NEUTROS PCT: 58 %
Neutro Abs: 3.2 10*3/uL (ref 1.4–6.5)
PLATELETS: 196 10*3/uL (ref 150–440)
RBC: 4.39 MIL/uL — AB (ref 4.40–5.90)
RDW: 16.1 % — ABNORMAL HIGH (ref 11.5–14.5)
WBC: 5.5 10*3/uL (ref 3.8–10.6)

## 2015-02-24 MED ORDER — SODIUM CHLORIDE 0.9 % IV SOLN
INTRAVENOUS | Status: DC
  Administered 2015-02-24: 15:00:00 via INTRAVENOUS
  Filled 2015-02-24: qty 1000

## 2015-02-24 MED ORDER — ZOLEDRONIC ACID 4 MG/100ML IV SOLN
4.0000 mg | Freq: Once | INTRAVENOUS | Status: AC
  Administered 2015-02-24: 4 mg via INTRAVENOUS
  Filled 2015-02-24: qty 100

## 2015-02-24 NOTE — Patient Instructions (Signed)
Restart revlimid in 1 week as md recommended. Do not take the dexamethasone at this time. As the MD discussed, long-term use of steriods could put you at risk for future infections.

## 2015-02-24 NOTE — Progress Notes (Signed)
[facility] OFFICE PROGRESS NOTE  [name] Care Team: Petra Kuba, MD as PCP - General (Family Medicine)   SUMMARY OF ONCOLOGIC HISTORY:  # SEP 2015- MULTIPLE MYELOMA [multiple PET pos Bone lesions; hypercalcemia s/p BMBx; FISH- Aneuploidy - gain of chromosome 7,9,15,FGFR3/4p16.3, and CCND1/11q13. Loss of MAF/16q23.1), cytogenetics normal 46XY] s/p Vel-Dex-Rev-Zometa; Excellent PR   # MARCH 2016- REV-DEX ; FEB 2017-[discn Dex] Rev 25 mg 3 w On & 1 w Off  # Zometa q 6 w  # Chronic pain  # COPD/smoking/ [[facility]- not candidate for BMT- sec to co-morbidities/poor nutritional status];     INTERVAL HISTORY:  55 year old male [name] with above history of multiple myeloma on maintenance Revlimid-dexamethasone  Is here for follow-up.    [name] is chronic back pain not any worse-  Long-term narcotic pain medication.  Denies any worsening shortness of breath or cough. Continues to have chronic cough. Unfortunately continues to smoke.  No high-grade fevers.   He continues to be on fentanyl patch/Dilaudid pills.  No skin rash or diarrhea. Denies any jaw pain.   REVIEW OF SYSTEMS:  A complete 10 point review of system is done which is negative except mentioned above/history of present illness.   PAST MEDICAL HISTORY :  Past Medical History  Diagnosis Date  . Multiple myeloma ([place])   . Multiple myeloma ([place])   . Anxiety   . CHF (congestive heart failure) ([place])   . COPD (chronic obstructive pulmonary disease) ([place])   . GERD (gastroesophageal reflux disease)   . Substance abuse   . Myocardial infarction ([place])   . Coronary artery disease   . Atrial septal defect   . Hyperlipidemia   . Dysrhythmias     PAST SURGICAL HISTORY :  No past surgical history on file.  FAMILY HISTORY :   Family History  Problem Relation Age of Onset  . COPD Mother   . CAD Mother   . Heart attack Father     SOCIAL HISTORY:   Social History  Substance Use Topics  . Smoking status:  Current Every Day Smoker -- 0.50 packs/day for 40 years    Types: Cigarettes  . Smokeless tobacco: Never Used  . Alcohol Use: No    ALLERGIES:  has No Known Allergies.  MEDICATIONS:  Current Outpatient Prescriptions  Medication Sig Dispense Refill  . albuterol (PROVENTIL HFA;VENTOLIN HFA) 108 (90 BASE) MCG/ACT inhaler Inhale 2 puffs into the lungs every 6 (six) hours as needed for wheezing or shortness of breath.    . ALPRAZolam (XANAX) 0.5 MG tablet Take 1-2 tablets (0.5-1 mg total) by mouth 3 (three) times daily as needed for anxiety. 90 tablet 0  . aspirin 325 MG EC tablet Take 325 mg by mouth daily.    Marland Kitchen atorvastatin (LIPITOR) 20 MG tablet Take 20 mg by mouth daily. Reported on 01/27/2015    . carvedilol (COREG) 6.25 MG tablet Take 6.25 mg by mouth 2 (two) times daily.     . collagenase (SANTYL) ointment Apply topically daily. 15 g 0  . dexamethasone (DECADRON) 4 MG tablet Once a week while on revlimid 40 tablet 3  . docusate sodium (COLACE) 100 MG capsule Take 100 mg by mouth 2 (two) times daily.    . fentaNYL (DURAGESIC - DOSED MCG/HR) 100 MCG/HR Place 1 patch (100 mcg total) onto the skin every other day. Pt uses in addition to a 65mg and 275m patch. 15 patch 0  . fentaNYL (DURAGESIC - DOSED MCG/HR) 12 MCG/HR Place  1 patch (12.5 mcg total) onto the skin every other day. Pt uses in addition to a 3mg and 1062m patch. 15 patch 0  . fentaNYL (DURAGESIC - DOSED MCG/HR) 25 MCG/HR patch Place 1 patch (25 mcg total) onto the skin every other day. Pt uses in addition to a 1241mand 100m34match. 15 patch 0  . Fluticasone-Salmeterol (ADVAIR) 100-50 MCG/DOSE AEPB Inhale 1 puff into the lungs 2 (two) times daily.    . HYMarland KitchenROmorphone (DILAUDID) 2 MG tablet Take 1-2 tablets (2-4 mg total) by mouth every 4 (four) hours as needed for severe pain. 130 tablet 0  . ibuprofen (ADVIL,MOTRIN) 200 MG tablet Take 400 mg by mouth every 6 (six) hours as needed for fever.    . ipMarland Kitchenatropium-albuterol (DUONEB)  0.5-2.5 (3) MG/3ML SOLN Take 3 mLs by nebulization every 4 (four) hours as needed. 360 mL 3  . lenalidomide (REVLIMID) 25 MG capsule Take 25 mg by mouth daily. Reported on 01/27/2015    . lisinopril (PRINIVIL,ZESTRIL) 2.5 MG tablet Take 2.5 mg by mouth daily.    . polyethylene glycol (MIRALAX / GLYCOLAX) packet Take 17 g by mouth daily.    . promethazine (PHENERGAN) 25 MG tablet Take 1 tablet (25 mg total) by mouth every 4 (four) hours as needed for nausea or vomiting. 60 tablet 2  . tiotropium (SPIRIVA) 18 MCG inhalation capsule Place 1 capsule (18 mcg total) into inhaler and inhale daily. 30 capsule 12  . amiodarone (PACERONE) 200 MG tablet Take 200 mg by mouth daily. Reported on 02/24/2015    . gabapentin (NEURONTIN) 100 MG capsule Take 1 capsule (100 mg total) by mouth 3 (three) times daily. 90 capsule 3   No current facility-administered medications for this visit.    PHYSICAL EXAMINATION: ECOG PERFORMANCE STATUS: 2 - Symptomatic, <50% confined to bed  BP 106/69 mmHg  Pulse 70  Temp(Src) 97.1 F (36.2 C) (Tympanic)  Resp 18  Ht 6' (1.829 m)  Wt 135 lb 9.3 oz (61.5 kg)  BMI 18.38 kg/m2  Filed Weights   02/24/15 1343  Weight: 135 lb 9.3 oz (61.5 kg)    GENERAL: Moderately nourished moderately CAUCASIAN male [name]. Alert, no distress and comfortable.  Accompanied by family.  EYES: no pallor or icterus OROPHARYNX: no thrush or ulceration; poor dentition. NECK: supple, no masses felt LYMPH:  no palpable lymphadenopathy in the cervical, axillary or inguinal regions LUNGS: With decreased bilateral breath sounds. No wheeze or crackles HEART/CVS: regular rate & rhythm and no murmurs;  No lower extremity edema. ABDOMEN:abdomen soft, non-tender and normal bowel sounds Musculoskeletal:no cyanosis of digits and no clubbing  PSYCH: alert & oriented x 3 with fluent speech NEURO: no focal motor/sensory deficits SKIN:  no rashes or significant lesions  LABORATORY DATA:  I have reviewed  the data as listed    Component Value Date/Time   NA 136 02/24/2015 1257   NA 135 05/12/2014 1343   K 3.2* 02/24/2015 1257   K 3.3* 05/12/2014 1343   CL 100* 02/24/2015 1257   CL 101 05/12/2014 1343   CO2 31 02/24/2015 1257   CO2 27 05/12/2014 1343   GLUCOSE 91 02/24/2015 1257   GLUCOSE 77 05/12/2014 1343   BUN 10 02/24/2015 1257   BUN 9 05/12/2014 1343   CREATININE 0.78 02/24/2015 1257   CREATININE 0.89 05/12/2014 1343   CALCIUM 8.4* 02/24/2015 1257   CALCIUM 8.8* 05/12/2014 1343   PROT 6.6 02/24/2015 1257   PROT 5.9* 02/17/2014 1405   ALBUMIN 3.5  02/24/2015 1257   ALBUMIN 2.8* 02/17/2014 1405   AST 12* 02/24/2015 1257   AST 9* 02/17/2014 1405   ALT 17 02/24/2015 1257   ALT 12* 02/17/2014 1405   ALKPHOS 33* 02/24/2015 1257   ALKPHOS 44* 02/17/2014 1405   BILITOT 0.4 02/24/2015 1257   BILITOT 0.2 02/17/2014 1405   GFRNONAA >60 02/24/2015 1257   GFRNONAA >60 05/12/2014 1343   GFRNONAA >60 02/17/2014 1405   GFRAA >60 02/24/2015 1257   GFRAA >60 05/12/2014 1343   GFRAA >60 02/17/2014 1405    No results found for: SPEP, UPEP  Lab Results  Component Value Date   WBC 5.5 02/24/2015   NEUTROABS 3.2 02/24/2015   HGB 13.8 02/24/2015   HCT 41.1 02/24/2015   MCV 93.8 02/24/2015   PLT 196 02/24/2015      Chemistry      Component Value Date/Time   NA 136 02/24/2015 1257   NA 135 05/12/2014 1343   K 3.2* 02/24/2015 1257   K 3.3* 05/12/2014 1343   CL 100* 02/24/2015 1257   CL 101 05/12/2014 1343   CO2 31 02/24/2015 1257   CO2 27 05/12/2014 1343   BUN 10 02/24/2015 1257   BUN 9 05/12/2014 1343   CREATININE 0.78 02/24/2015 1257   CREATININE 0.89 05/12/2014 1343      Component Value Date/Time   CALCIUM 8.4* 02/24/2015 1257   CALCIUM 8.8* 05/12/2014 1343   ALKPHOS 33* 02/24/2015 1257   ALKPHOS 44* 02/17/2014 1405   AST 12* 02/24/2015 1257   AST 9* 02/17/2014 1405   ALT 17 02/24/2015 1257   ALT 12* 02/17/2014 1405   BILITOT 0.4 02/24/2015 1257   BILITOT 0.2  02/17/2014 1405       RADIOGRAPHIC STUDIES: I have personally reviewed the radiological images as listed and agreed with the findings in the report. No results found.   ASSESSMENT & PLAN:   # MULTIPLE MYELOMA- status post induction Velcade Revlimid and dexamethasone with a very good partial response. [name] is currently on Revlimid 25 mg 3 weeks on and one-week off and dexamethasone 40 mg weeklymaintenance.  Most recent  M protein December 2016-  0.5 M spike;  Light chain ratio 10.   #  Clinically no evidence of progression noted.  I recommend continued Revlimid 25 mg 3 weeks on 1 week off.  CBC within normal limits.  CMP pending.  However I recommend  Discontinuation of dexamethasone given his history of recent pneumonia.  # COPD-  Significant improvement.;  Reviewed the CT scan done in and of January 2016-  Left lingular infiltrate/ not  Symptomatic.  Recommend a CT chest without contrast prior to next visit.  # chronic pain/anxiety- on fentanyl and Dilaudid; Xanax. Medications refilled  # Will reevaluate the [name] in  6 weeks/ recommend labs to be done few days prior to the next visit.  # 25 minutes face-to-face with the [name] discussing the above plan of care; more than 50% of time spent on prognosis/ natural history; counseling and coordination.       Cammie Sickle, MD 02/24/2015 2:15 PM

## 2015-03-01 ENCOUNTER — Other Ambulatory Visit: Payer: Self-pay | Admitting: Internal Medicine

## 2015-03-01 DIAGNOSIS — J449 Chronic obstructive pulmonary disease, unspecified: Secondary | ICD-10-CM

## 2015-03-01 DIAGNOSIS — C9000 Multiple myeloma not having achieved remission: Secondary | ICD-10-CM

## 2015-03-01 DIAGNOSIS — F419 Anxiety disorder, unspecified: Secondary | ICD-10-CM

## 2015-03-01 DIAGNOSIS — J4489 Other specified chronic obstructive pulmonary disease: Secondary | ICD-10-CM

## 2015-03-01 MED ORDER — HYDROMORPHONE HCL 2 MG PO TABS: 2.0000 mg | ORAL_TABLET | ORAL | Status: DC | PRN

## 2015-03-23 ENCOUNTER — Other Ambulatory Visit: Payer: Self-pay | Admitting: *Deleted

## 2015-03-23 DIAGNOSIS — F419 Anxiety disorder, unspecified: Secondary | ICD-10-CM

## 2015-03-23 DIAGNOSIS — J449 Chronic obstructive pulmonary disease, unspecified: Secondary | ICD-10-CM

## 2015-03-23 DIAGNOSIS — C9000 Multiple myeloma not having achieved remission: Secondary | ICD-10-CM

## 2015-03-23 NOTE — Telephone Encounter (Signed)
Will pick these up tomorrow mid morning in [place] office

## 2015-03-24 MED ORDER — PROMETHAZINE HCL 25 MG PO TABS: 25.0000 mg | ORAL_TABLET | ORAL | Status: DC | PRN

## 2015-03-24 MED ORDER — FENTANYL 12 MCG/HR TD PT72: 12.0000 ug | MEDICATED_PATCH | TRANSDERMAL | Status: DC

## 2015-03-24 MED ORDER — FENTANYL 100 MCG/HR TD PT72: 100.0000 ug | MEDICATED_PATCH | TRANSDERMAL | Status: DC

## 2015-03-24 MED ORDER — ALPRAZOLAM 0.5 MG PO TABS: 0.5000 mg | ORAL_TABLET | Freq: Three times a day (TID) | ORAL | Status: DC | PRN

## 2015-03-24 MED ORDER — HYDROMORPHONE HCL 2 MG PO TABS: 2.0000 mg | ORAL_TABLET | ORAL | Status: DC | PRN

## 2015-03-24 MED ORDER — FENTANYL 25 MCG/HR TD PT72: 25.0000 ug | MEDICATED_PATCH | TRANSDERMAL | Status: DC

## 2015-03-29 ENCOUNTER — Other Ambulatory Visit: Payer: Self-pay | Admitting: Internal Medicine

## 2015-03-31 ENCOUNTER — Ambulatory Visit: Payer: Medicaid Other

## 2015-03-31 ENCOUNTER — Inpatient Hospital Stay: Payer: Medicaid Other | Attending: Internal Medicine

## 2015-03-31 DIAGNOSIS — I2510 Atherosclerotic heart disease of native coronary artery without angina pectoris: Secondary | ICD-10-CM | POA: Insufficient documentation

## 2015-03-31 DIAGNOSIS — J449 Chronic obstructive pulmonary disease, unspecified: Secondary | ICD-10-CM | POA: Insufficient documentation

## 2015-03-31 DIAGNOSIS — F419 Anxiety disorder, unspecified: Secondary | ICD-10-CM | POA: Insufficient documentation

## 2015-03-31 DIAGNOSIS — Z79899 Other long term (current) drug therapy: Secondary | ICD-10-CM | POA: Insufficient documentation

## 2015-03-31 DIAGNOSIS — F17210 Nicotine dependence, cigarettes, uncomplicated: Secondary | ICD-10-CM | POA: Insufficient documentation

## 2015-03-31 DIAGNOSIS — K219 Gastro-esophageal reflux disease without esophagitis: Secondary | ICD-10-CM | POA: Insufficient documentation

## 2015-03-31 DIAGNOSIS — G8929 Other chronic pain: Secondary | ICD-10-CM | POA: Insufficient documentation

## 2015-03-31 DIAGNOSIS — E039 Hypothyroidism, unspecified: Secondary | ICD-10-CM | POA: Insufficient documentation

## 2015-03-31 DIAGNOSIS — E785 Hyperlipidemia, unspecified: Secondary | ICD-10-CM | POA: Insufficient documentation

## 2015-03-31 DIAGNOSIS — I959 Hypotension, unspecified: Secondary | ICD-10-CM | POA: Insufficient documentation

## 2015-03-31 DIAGNOSIS — Z7982 Long term (current) use of aspirin: Secondary | ICD-10-CM | POA: Insufficient documentation

## 2015-03-31 DIAGNOSIS — R911 Solitary pulmonary nodule: Secondary | ICD-10-CM | POA: Insufficient documentation

## 2015-03-31 DIAGNOSIS — M899 Disorder of bone, unspecified: Secondary | ICD-10-CM | POA: Insufficient documentation

## 2015-03-31 DIAGNOSIS — M549 Dorsalgia, unspecified: Secondary | ICD-10-CM | POA: Insufficient documentation

## 2015-03-31 DIAGNOSIS — C9000 Multiple myeloma not having achieved remission: Secondary | ICD-10-CM | POA: Insufficient documentation

## 2015-03-31 DIAGNOSIS — Z8249 Family history of ischemic heart disease and other diseases of the circulatory system: Secondary | ICD-10-CM | POA: Insufficient documentation

## 2015-04-05 ENCOUNTER — Inpatient Hospital Stay: Payer: Medicaid Other

## 2015-04-05 ENCOUNTER — Ambulatory Visit
Admission: RE | Admit: 2015-04-05 | Discharge: 2015-04-05 | Disposition: A | Discharge status: Home / Self Care | Payer: Medicaid Other | Source: Ambulatory Visit | Attending: Internal Medicine | Admitting: Internal Medicine

## 2015-04-05 ENCOUNTER — Other Ambulatory Visit: Payer: Self-pay | Admitting: *Deleted

## 2015-04-05 ENCOUNTER — Other Ambulatory Visit: Payer: Self-pay | Admitting: Internal Medicine

## 2015-04-05 DIAGNOSIS — E039 Hypothyroidism, unspecified: Secondary | ICD-10-CM | POA: Diagnosis not present

## 2015-04-05 DIAGNOSIS — Z7982 Long term (current) use of aspirin: Secondary | ICD-10-CM | POA: Diagnosis not present

## 2015-04-05 DIAGNOSIS — G8929 Other chronic pain: Secondary | ICD-10-CM | POA: Diagnosis not present

## 2015-04-05 DIAGNOSIS — F17210 Nicotine dependence, cigarettes, uncomplicated: Secondary | ICD-10-CM | POA: Diagnosis not present

## 2015-04-05 DIAGNOSIS — I959 Hypotension, unspecified: Secondary | ICD-10-CM | POA: Diagnosis not present

## 2015-04-05 DIAGNOSIS — F419 Anxiety disorder, unspecified: Secondary | ICD-10-CM | POA: Diagnosis not present

## 2015-04-05 DIAGNOSIS — E785 Hyperlipidemia, unspecified: Secondary | ICD-10-CM | POA: Diagnosis not present

## 2015-04-05 DIAGNOSIS — R918 Other nonspecific abnormal finding of lung field: Secondary | ICD-10-CM | POA: Insufficient documentation

## 2015-04-05 DIAGNOSIS — I2510 Atherosclerotic heart disease of native coronary artery without angina pectoris: Secondary | ICD-10-CM | POA: Diagnosis not present

## 2015-04-05 DIAGNOSIS — Z79899 Other long term (current) drug therapy: Secondary | ICD-10-CM | POA: Diagnosis not present

## 2015-04-05 DIAGNOSIS — R911 Solitary pulmonary nodule: Secondary | ICD-10-CM | POA: Diagnosis not present

## 2015-04-05 DIAGNOSIS — C9000 Multiple myeloma not having achieved remission: Secondary | ICD-10-CM | POA: Diagnosis not present

## 2015-04-05 DIAGNOSIS — M549 Dorsalgia, unspecified: Secondary | ICD-10-CM | POA: Diagnosis not present

## 2015-04-05 DIAGNOSIS — J449 Chronic obstructive pulmonary disease, unspecified: Secondary | ICD-10-CM | POA: Diagnosis not present

## 2015-04-05 DIAGNOSIS — K219 Gastro-esophageal reflux disease without esophagitis: Secondary | ICD-10-CM | POA: Diagnosis not present

## 2015-04-05 DIAGNOSIS — M899 Disorder of bone, unspecified: Secondary | ICD-10-CM | POA: Diagnosis not present

## 2015-04-05 DIAGNOSIS — Z8249 Family history of ischemic heart disease and other diseases of the circulatory system: Secondary | ICD-10-CM | POA: Diagnosis not present

## 2015-04-05 LAB — CBC WITH DIFFERENTIAL/PLATELET
BASOS ABS: 0 10*3/uL (ref 0–0.1)
Basophils Relative: 1 %
EOS PCT: 3 %
Eosinophils Absolute: 0.1 10*3/uL (ref 0–0.7)
HEMATOCRIT: 39.4 % — AB (ref 40.0–52.0)
Hemoglobin: 13.7 g/dL (ref 13.0–18.0)
LYMPHS PCT: 26 %
Lymphs Abs: 1.1 10*3/uL (ref 1.0–3.6)
MCH: 32 pg (ref 26.0–34.0)
MCHC: 34.7 g/dL (ref 32.0–36.0)
MCV: 92.3 fL (ref 80.0–100.0)
MONO ABS: 0.7 10*3/uL (ref 0.2–1.0)
MONOS PCT: 16 %
NEUTROS ABS: 2.2 10*3/uL (ref 1.4–6.5)
Neutrophils Relative %: 54 %
PLATELETS: 205 10*3/uL (ref 150–440)
RBC: 4.26 MIL/uL — ABNORMAL LOW (ref 4.40–5.90)
RDW: 16.5 % — AB (ref 11.5–14.5)
WBC: 4.1 10*3/uL (ref 3.8–10.6)

## 2015-04-05 LAB — COMPREHENSIVE METABOLIC PANEL
ALT: 8 U/L — ABNORMAL LOW (ref 17–63)
ANION GAP: 4 — AB (ref 5–15)
AST: 12 U/L — ABNORMAL LOW (ref 15–41)
Albumin: 3.4 g/dL — ABNORMAL LOW (ref 3.5–5.0)
Alkaline Phosphatase: 34 U/L — ABNORMAL LOW (ref 38–126)
BILIRUBIN TOTAL: 0.5 mg/dL (ref 0.3–1.2)
BUN: 9 mg/dL (ref 6–20)
CHLORIDE: 102 mmol/L (ref 101–111)
CO2: 31 mmol/L (ref 22–32)
Calcium: 9 mg/dL (ref 8.9–10.3)
Creatinine, Ser: 0.93 mg/dL (ref 0.61–1.24)
Glucose, Bld: 108 mg/dL — ABNORMAL HIGH (ref 65–99)
POTASSIUM: 3.7 mmol/L (ref 3.5–5.1)
Sodium: 137 mmol/L (ref 135–145)
TOTAL PROTEIN: 6.6 g/dL (ref 6.5–8.1)

## 2015-04-06 ENCOUNTER — Other Ambulatory Visit: Payer: Self-pay | Admitting: Internal Medicine

## 2015-04-06 LAB — KAPPA/LAMBDA LIGHT CHAINS
KAPPA, LAMDA LIGHT CHAIN RATIO: 7.95 — AB (ref 0.26–1.65)
Kappa free light chain: 81.44 mg/L — ABNORMAL HIGH (ref 3.30–19.40)
Lambda free light chains: 10.24 mg/L (ref 5.71–26.30)

## 2015-04-06 LAB — MULTIPLE MYELOMA PANEL, SERUM
ALPHA 1: 0.3 g/dL (ref 0.0–0.4)
ALPHA2 GLOB SERPL ELPH-MCNC: 0.8 g/dL (ref 0.4–1.0)
Albumin SerPl Elph-Mcnc: 2.9 g/dL (ref 2.9–4.4)
Albumin/Glob SerPl: 1.2 (ref 0.7–1.7)
B-Globulin SerPl Elph-Mcnc: 0.7 g/dL (ref 0.7–1.3)
Gamma Glob SerPl Elph-Mcnc: 0.8 g/dL (ref 0.4–1.8)
Globulin, Total: 2.5 g/dL (ref 2.2–3.9)
IGG (IMMUNOGLOBIN G), SERUM: 791 mg/dL (ref 700–1600)
IGM, SERUM: 23 mg/dL (ref 20–172)
IgA: 73 mg/dL — ABNORMAL LOW (ref 90–386)
M PROTEIN SERPL ELPH-MCNC: 0.4 g/dL — AB
TOTAL PROTEIN ELP: 5.4 g/dL — AB (ref 6.0–8.5)

## 2015-04-07 ENCOUNTER — Encounter: Payer: Self-pay | Admitting: Internal Medicine

## 2015-04-07 ENCOUNTER — Inpatient Hospital Stay (HOSPITAL_BASED_OUTPATIENT_CLINIC_OR_DEPARTMENT_OTHER): Payer: Medicaid Other | Admitting: Internal Medicine

## 2015-04-07 ENCOUNTER — Other Ambulatory Visit: Payer: Medicaid Other

## 2015-04-07 ENCOUNTER — Inpatient Hospital Stay: Payer: Medicaid Other

## 2015-04-07 VITALS — BP 60/40 | HR 60 | Temp 97.9°F | Resp 18 | Ht 72.0 in | Wt 132.0 lb

## 2015-04-07 VITALS — BP 94/63 | HR 80 | Resp 20

## 2015-04-07 DIAGNOSIS — M549 Dorsalgia, unspecified: Secondary | ICD-10-CM

## 2015-04-07 DIAGNOSIS — C9000 Multiple myeloma not having achieved remission: Secondary | ICD-10-CM

## 2015-04-07 DIAGNOSIS — E785 Hyperlipidemia, unspecified: Secondary | ICD-10-CM

## 2015-04-07 DIAGNOSIS — R911 Solitary pulmonary nodule: Secondary | ICD-10-CM

## 2015-04-07 DIAGNOSIS — M899 Disorder of bone, unspecified: Secondary | ICD-10-CM

## 2015-04-07 DIAGNOSIS — J4489 Other specified chronic obstructive pulmonary disease: Secondary | ICD-10-CM

## 2015-04-07 DIAGNOSIS — I2510 Atherosclerotic heart disease of native coronary artery without angina pectoris: Secondary | ICD-10-CM

## 2015-04-07 DIAGNOSIS — J449 Chronic obstructive pulmonary disease, unspecified: Secondary | ICD-10-CM

## 2015-04-07 DIAGNOSIS — G8929 Other chronic pain: Secondary | ICD-10-CM

## 2015-04-07 DIAGNOSIS — I959 Hypotension, unspecified: Secondary | ICD-10-CM | POA: Diagnosis not present

## 2015-04-07 DIAGNOSIS — E039 Hypothyroidism, unspecified: Secondary | ICD-10-CM

## 2015-04-07 DIAGNOSIS — F419 Anxiety disorder, unspecified: Secondary | ICD-10-CM

## 2015-04-07 DIAGNOSIS — F17210 Nicotine dependence, cigarettes, uncomplicated: Secondary | ICD-10-CM

## 2015-04-07 DIAGNOSIS — I952 Hypotension due to drugs: Secondary | ICD-10-CM

## 2015-04-07 LAB — IMMUNOFIXATION ELECTROPHORESIS
IGM, SERUM: 24 mg/dL (ref 20–172)
IgA: 72 mg/dL — ABNORMAL LOW (ref 90–386)
IgG (Immunoglobin G), Serum: 781 mg/dL (ref 700–1600)
Total Protein ELP: 5.6 g/dL — ABNORMAL LOW (ref 6.0–8.5)

## 2015-04-07 MED ORDER — SODIUM CHLORIDE 0.9 % IV SOLN
Freq: Once | INTRAVENOUS | Status: AC
  Administered 2015-04-07: 12:00:00 via INTRAVENOUS
  Filled 2015-04-07: qty 1000

## 2015-04-07 MED ORDER — ALPRAZOLAM 0.5 MG PO TABS: 0.5000 mg | ORAL_TABLET | Freq: Three times a day (TID) | ORAL | Status: DC | PRN

## 2015-04-07 MED ORDER — HYDROMORPHONE HCL 2 MG PO TABS: 2.0000 mg | ORAL_TABLET | ORAL | Status: DC | PRN

## 2015-04-07 MED ORDER — ZOLEDRONIC ACID 4 MG/100ML IV SOLN
4.0000 mg | Freq: Once | INTRAVENOUS | Status: AC
  Administered 2015-04-07: 4 mg via INTRAVENOUS
  Filled 2015-04-07: qty 100

## 2015-04-07 NOTE — Patient Instructions (Signed)
Due to hypotension (low blood pressure), hold tonight's dose of your Coreg. Please do not take your lisinopril tomorrow morning.

## 2015-04-07 NOTE — Progress Notes (Signed)
pt girlfriend reports "pt had flu-like symptoms last wk with nausea, vomiting, fever and chills last week. I still feel very weak" c/o "sore toe" "I also clipped the skin off of my toe yesterday in attempt to cut my toenail. I had a hard time getting the bleeding to stop.I only put a bandaid on it"  Pt very weak today. Needs 1 assist when moving from sitting to standing position. Pt c/o dizziness. bp today 69/51 automatic, bp manual sitting 68/40, bp manual standing 60/40. Pt took his dilaudid, xanax, gabapentin, lisinopril and coreg this morning. Pt states that his pmd d/c the amiodaron due to "elevated tsh levels"  He is requests a RF on Xanax and dilaudid.

## 2015-04-07 NOTE — Progress Notes (Signed)
[name] OFFICE PROGRESS NOTE  Patient Care Team: Petra Kuba, MD as PCP - General (Family Medicine)   SUMMARY OF ONCOLOGIC HISTORY:  # SEP 2015- MULTIPLE MYELOMA [multiple PET pos Bone lesions; hypercalcemia s/p BMBx; FISH- Aneuploidy - gain of chromosome 7,9,15,FGFR3/4p16.3, and CCND1/11q13. Loss of MAF/16q23.1), cytogenetics normal 46XY] s/p Vel-Dex-Rev-Zometa; Excellent PR;   # MARCH 2016- REV-DEX ; FEB 2017-[discn Dex] Rev 25 mg 3 w On & 1 w Off; March 2017- M- protein 0.4gm/dl; K/L= 7.9; cont Rev  # March 2017-  Left Middle Lobe cavitary lesion- ~71m- repeat Ct in 3-421m# Zometa q 6 w  # Chronic pain  # COPD/smoking/ [[facility]- not candidate for BMT- sec to co-morbidities/poor nutritional status];     INTERVAL HISTORY:  [age]ear old male patient with above history of multiple myeloma on maintenance Revlimid-dexamethasone  Is here for follow-up.   Patient continues to have chronic back pain. Chronic pain medication not any worse. He has been feeling fatigued. Denies any worsening shortness of breath or cough. Continues to have chronic cough. Unfortunately continues to smoke.  No high-grade fevers.   He continues to be on fentanyl patch/Dilaudid pills.  No skin rash or diarrhea. Denies any jaw pain. As per the family's patient had been taken off his primary on because of hypothyroidism as per cardiology.   REVIEW OF SYSTEMS:  A complete 10 point review of system is done which is negative except mentioned above/history of present illness.   PAST MEDICAL HISTORY :  Past Medical History  Diagnosis Date  . Multiple myeloma ([place]  . [place]  . Anxiety   . [place] heart failure) ([place]  . COPD ([place] pulmonary disease) ([place]  . GERD (gastroesophageal reflux disease)   . Substance abuse   . [place] ([place]  . Coronary artery disease   . Atrial septal defect   . Hyperlipidemia   . Dysrhythmias      PAST SURGICAL HISTORY :  History reviewed. No pertinent past surgical history.  FAMILY HISTORY :   Family History  Problem Relation Age of Onset  . COPD Mother   . CAD Mother   . Heart attack Father     SOCIAL HISTORY:   Social History  Substance Use Topics  . Smoking status: Current Every Day Smoker -- 0.50 packs/day for 40 years    Types: Cigarettes  . Smokeless tobacco: Never Used  . Alcohol Use: No    ALLERGIES:  has No Known Allergies.  MEDICATIONS:  Current Outpatient Prescriptions  Medication Sig Dispense Refill  . albuterol (PROVENTIL HFA;VENTOLIN HFA) 108 (90 BASE) MCG/ACT inhaler Inhale 2 puffs into the lungs every 6 (six) hours as needed for wheezing or shortness of breath.    . ALPRAZolam (XANAX) 0.5 MG tablet Take 1-2 tablets (0.5-1 mg total) by mouth 3 (three) times daily as needed for anxiety. 90 tablet 0  . aspirin 325 MG EC tablet Take 325 mg by mouth daily.    . Marland Kitchentorvastatin (LIPITOR) 20 MG tablet Take 20 mg by mouth daily. Reported on 01/27/2015    . carvedilol (COREG) 6.25 MG tablet Take 6.25 mg by mouth 2 (two) times daily.     . collagenase (SANTYL) ointment Apply topically daily. 15 g 0  . fentaNYL (DURAGESIC - DOSED MCG/HR) 100 MCG/HR Place 1 patch (100 mcg total) onto the skin every other day. Pt uses in addition to a 1264mand 81m10match. 15 patch  0  . fentaNYL (DURAGESIC - DOSED MCG/HR) 12 MCG/HR Place 1 patch (12.5 mcg total) onto the skin every other day. Pt uses in addition to a 69mg and 1030m patch. 15 patch 0  . fentaNYL (DURAGESIC - DOSED MCG/HR) 25 MCG/HR patch Place 1 patch (25 mcg total) onto the skin every other day. Pt uses in addition to a 1277mand 100m2match. 15 patch 0  . Fluticasone-Salmeterol (ADVAIR) 100-50 MCG/DOSE AEPB Inhale 1 puff into the lungs 2 (two) times daily.    . gaMarland Kitchenapentin (NEURONTIN) 100 MG capsule Take 1 capsule (100 mg total) by mouth 3 (three) times daily. 90 capsule 3  . HYDROmorphone (DILAUDID) 2 MG tablet  Take 1-2 tablets (2-4 mg total) by mouth every 4 (four) hours as needed for severe pain. 130 tablet 0  . ipratropium-albuterol (DUONEB) 0.5-2.5 (3) MG/3ML SOLN Take 3 mLs by nebulization every 4 (four) hours as needed. 360 mL 3  . lenalidomide (REVLIMID) 25 MG capsule Take 25 mg by mouth daily. Reported on 01/27/2015    . lisinopril (PRINIVIL,ZESTRIL) 2.5 MG tablet Take 2.5 mg by mouth daily.    . promethazine (PHENERGAN) 25 MG tablet Take 1 tablet (25 mg total) by mouth every 4 (four) hours as needed for nausea or vomiting. 60 tablet 2  . docusate sodium (COLACE) 100 MG capsule Take 100 mg by mouth 2 (two) times daily. Reported on 04/07/2015    . polyethylene glycol (MIRALAX / GLYCOLAX) packet Take 17 g by mouth daily. Reported on 04/07/2015    . tiotropium (SPIRIVA) 18 MCG inhalation capsule Place 1 capsule (18 mcg total) into inhaler and inhale daily. (Patient not taking: Reported on 04/07/2015) 30 capsule 12   No current facility-administered medications for this visit.   Facility-Administered Medications Ordered in Other Visits  Medication Dose Route Frequency Provider Last Rate Last Dose  . 0.9 %  sodium chloride infusion   Intravenous Continuous GoviCammie Sickle 10 mL/hr at 02/24/15 1520      PHYSICAL EXAMINATION: ECOG PERFORMANCE STATUS: 2 - Symptomatic, <50% confined to bed  Temp(Src) 97.9 F (36.6 C) (Tympanic)  Resp 18  Ht 6' (1.829 m)  Wt 132 lb (59.875 kg)  BMI 17.90 kg/m2  Filed Weights   04/07/15 1055  Weight: 132 lb (59.875 kg)    GENERAL: Moderately nourished moderately CAUCASIAN male patient. Alert, no distress and comfortable.  Accompanied by family; he in a wheel chair.  EYES: no pallor or icterus OROPHARYNX: no thrush or ulceration; poor dentition. NECK: supple, no masses felt LYMPH:  no palpable lymphadenopathy in the cervical, axillary or inguinal regions LUNGS: With decreased bilateral breath sounds. No wheeze or crackles HEART/CVS: regular rate &  rhythm and no murmurs;  No lower extremity edema. ABDOMEN:abdomen soft, non-tender and normal bowel sounds Musculoskeletal:no cyanosis of digits and no clubbing  PSYCH: alert & oriented x 3 with fluent speech NEURO: no focal motor/sensory deficits SKIN:  no rashes or significant lesions  LABORATORY DATA:  I have reviewed the data as listed    Component Value Date/Time   NA 137 04/05/2015 0909   NA 135 05/12/2014 1343   K 3.7 04/05/2015 0909   K 3.3* 05/12/2014 1343   CL 102 04/05/2015 0909   CL 101 05/12/2014 1343   CO2 31 04/05/2015 0909   CO2 27 05/12/2014 1343   GLUCOSE 108* 04/05/2015 0909   GLUCOSE 77 05/12/2014 1343   BUN 9 04/05/2015 0909   BUN 9 05/12/2014 1343   CREATININE  0.93 04/05/2015 0909   CREATININE 0.89 05/12/2014 1343   CALCIUM 9.0 04/05/2015 0909   CALCIUM 8.8* 05/12/2014 1343   PROT 6.6 04/05/2015 0909   PROT 5.9* 02/17/2014 1405   ALBUMIN 3.4* 04/05/2015 0909   ALBUMIN 2.8* 02/17/2014 1405   AST 12* 04/05/2015 0909   AST 9* 02/17/2014 1405   ALT 8* 04/05/2015 0909   ALT 12* 02/17/2014 1405   ALKPHOS 34* 04/05/2015 0909   ALKPHOS 44* 02/17/2014 1405   BILITOT 0.5 04/05/2015 0909   BILITOT 0.2 02/17/2014 1405   GFRNONAA >60 04/05/2015 0909   GFRNONAA >60 05/12/2014 1343   GFRNONAA >60 02/17/2014 1405   GFRAA >60 04/05/2015 0909   GFRAA >60 05/12/2014 1343   GFRAA >60 02/17/2014 1405    No results found for: SPEP, UPEP  Lab Results  Component Value Date   WBC 4.1 04/05/2015   NEUTROABS 2.2 04/05/2015   HGB 13.7 04/05/2015   HCT 39.4* 04/05/2015   MCV 92.3 04/05/2015   PLT 205 04/05/2015      Chemistry      Component Value Date/Time   NA 137 04/05/2015 0909   NA 135 05/12/2014 1343   K 3.7 04/05/2015 0909   K 3.3* 05/12/2014 1343   CL 102 04/05/2015 0909   CL 101 05/12/2014 1343   CO2 31 04/05/2015 0909   CO2 27 05/12/2014 1343   BUN 9 04/05/2015 0909   BUN 9 05/12/2014 1343   CREATININE 0.93 04/05/2015 0909   CREATININE 0.89  05/12/2014 1343      Component Value Date/Time   CALCIUM 9.0 04/05/2015 0909   CALCIUM 8.8* 05/12/2014 1343   ALKPHOS 34* 04/05/2015 0909   ALKPHOS 44* 02/17/2014 1405   AST 12* 04/05/2015 0909   AST 9* 02/17/2014 1405   ALT 8* 04/05/2015 0909   ALT 12* 02/17/2014 1405   BILITOT 0.5 04/05/2015 0909   BILITOT 0.2 02/17/2014 1405       ASSESSMENT & PLAN:   # MULTIPLE MYELOMA- status post induction Velcade Revlimid and dexamethasone with a very good partial response. Patient is currently on Revlimid 25 mg 3 weeks on and one-week off.    #  Clinically no evidence of progression noted. Most recent  M protein March  2016-  0.4 M spike;  Light chain ratio 7.95.  I recommend continued Revlimid 25 mg 3 weeks on 1 week off.  CBC within normal limits. CMP-within normal limits.  # Hypotension- question medication induced. Recommend holding lisinopril. Continue with Coreg. We will give 250 mL of normal saline today. He is recommended to follow up with his cardiologist Dr. Humphrey Rolls.  # Left middle lobe cystic lesion about 12 mm. We will monitor with a repeat scan in about 3-4 months.   # chronic pain/anxiety- on fentanyl and Dilaudid; Xanax. Medications refilled  # Will reevaluate the patient in  6 weeks/ recommend labs to be done few days prior to the next visit.  # 25 minutes face-to-face with the patient discussing the above plan of care; more than 50% of time spent on prognosis/ natural history; counseling and coordination.      Cammie Sickle, MD 04/07/2015 11:35 AM

## 2015-04-26 ENCOUNTER — Other Ambulatory Visit: Payer: Self-pay | Admitting: *Deleted

## 2015-04-26 DIAGNOSIS — C9000 Multiple myeloma not having achieved remission: Secondary | ICD-10-CM

## 2015-04-26 DIAGNOSIS — J449 Chronic obstructive pulmonary disease, unspecified: Secondary | ICD-10-CM

## 2015-04-26 DIAGNOSIS — F419 Anxiety disorder, unspecified: Secondary | ICD-10-CM

## 2015-04-26 MED ORDER — FENTANYL 12 MCG/HR TD PT72: 12.0000 ug | MEDICATED_PATCH | TRANSDERMAL | Status: DC

## 2015-04-26 MED ORDER — FENTANYL 25 MCG/HR TD PT72: 25.0000 ug | MEDICATED_PATCH | TRANSDERMAL | Status: DC

## 2015-04-26 MED ORDER — FENTANYL 100 MCG/HR TD PT72: 100.0000 ug | MEDICATED_PATCH | TRANSDERMAL | Status: DC

## 2015-04-26 MED ORDER — HYDROMORPHONE HCL 2 MG PO TABS: 2.0000 mg | ORAL_TABLET | ORAL | Status: DC | PRN

## 2015-04-26 MED ORDER — ALPRAZOLAM 0.5 MG PO TABS: 0.5000 mg | ORAL_TABLET | Freq: Three times a day (TID) | ORAL | Status: DC | PRN

## 2015-04-26 NOTE — Telephone Encounter (Signed)
Just filled for 30 days on 3/15

## 2015-04-26 NOTE — Telephone Encounter (Signed)
Asking for refill on  His alprazolam 0.5 mg (last got #90 on 3/15) Santiago Glad reports he takes 2 tabs three times a day. Also requests refill on fentanyl patches. Requests refill on his Hydromorphone 2 mg ( last got # 130 tabs on 3/15, she reports that he is taking 2 tabs 4 times a day and he is almost out of med

## 2015-04-27 ENCOUNTER — Other Ambulatory Visit: Payer: Self-pay | Admitting: *Deleted

## 2015-04-29 NOTE — Telephone Encounter (Signed)
[name] is working on this

## 2015-05-05 ENCOUNTER — Inpatient Hospital Stay (HOSPITAL_BASED_OUTPATIENT_CLINIC_OR_DEPARTMENT_OTHER): Payer: Medicaid Other | Admitting: Internal Medicine

## 2015-05-05 ENCOUNTER — Inpatient Hospital Stay: Payer: Medicaid Other | Attending: Internal Medicine

## 2015-05-05 VITALS — BP 110/72 | HR 92 | Temp 98.1°F | Resp 18 | Wt 130.7 lb

## 2015-05-05 DIAGNOSIS — R630 Anorexia: Secondary | ICD-10-CM | POA: Insufficient documentation

## 2015-05-05 DIAGNOSIS — Z7982 Long term (current) use of aspirin: Secondary | ICD-10-CM | POA: Insufficient documentation

## 2015-05-05 DIAGNOSIS — R05 Cough: Secondary | ICD-10-CM | POA: Insufficient documentation

## 2015-05-05 DIAGNOSIS — I2510 Atherosclerotic heart disease of native coronary artery without angina pectoris: Secondary | ICD-10-CM

## 2015-05-05 DIAGNOSIS — Z79899 Other long term (current) drug therapy: Secondary | ICD-10-CM | POA: Insufficient documentation

## 2015-05-05 DIAGNOSIS — F17210 Nicotine dependence, cigarettes, uncomplicated: Secondary | ICD-10-CM | POA: Insufficient documentation

## 2015-05-05 DIAGNOSIS — C9000 Multiple myeloma not having achieved remission: Secondary | ICD-10-CM | POA: Diagnosis not present

## 2015-05-05 DIAGNOSIS — R634 Abnormal weight loss: Secondary | ICD-10-CM | POA: Diagnosis not present

## 2015-05-05 DIAGNOSIS — G8929 Other chronic pain: Secondary | ICD-10-CM | POA: Insufficient documentation

## 2015-05-05 DIAGNOSIS — M545 Low back pain: Secondary | ICD-10-CM | POA: Diagnosis not present

## 2015-05-05 DIAGNOSIS — I252 Old myocardial infarction: Secondary | ICD-10-CM | POA: Diagnosis not present

## 2015-05-05 DIAGNOSIS — Z950 Presence of cardiac pacemaker: Secondary | ICD-10-CM | POA: Diagnosis not present

## 2015-05-05 DIAGNOSIS — F419 Anxiety disorder, unspecified: Secondary | ICD-10-CM | POA: Insufficient documentation

## 2015-05-05 DIAGNOSIS — D649 Anemia, unspecified: Secondary | ICD-10-CM | POA: Insufficient documentation

## 2015-05-05 DIAGNOSIS — J449 Chronic obstructive pulmonary disease, unspecified: Secondary | ICD-10-CM

## 2015-05-05 DIAGNOSIS — E785 Hyperlipidemia, unspecified: Secondary | ICD-10-CM | POA: Diagnosis not present

## 2015-05-05 DIAGNOSIS — R911 Solitary pulmonary nodule: Secondary | ICD-10-CM | POA: Diagnosis not present

## 2015-05-05 DIAGNOSIS — G629 Polyneuropathy, unspecified: Secondary | ICD-10-CM | POA: Diagnosis not present

## 2015-05-05 DIAGNOSIS — K219 Gastro-esophageal reflux disease without esophagitis: Secondary | ICD-10-CM | POA: Diagnosis not present

## 2015-05-05 DIAGNOSIS — M549 Dorsalgia, unspecified: Secondary | ICD-10-CM

## 2015-05-05 LAB — COMPREHENSIVE METABOLIC PANEL
ALT: 10 U/L — ABNORMAL LOW (ref 17–63)
ANION GAP: 2 — AB (ref 5–15)
AST: 12 U/L — ABNORMAL LOW (ref 15–41)
Albumin: 3.7 g/dL (ref 3.5–5.0)
Alkaline Phosphatase: 33 U/L — ABNORMAL LOW (ref 38–126)
BILIRUBIN TOTAL: 0.5 mg/dL (ref 0.3–1.2)
BUN: 9 mg/dL (ref 6–20)
CO2: 31 mmol/L (ref 22–32)
Calcium: 8.5 mg/dL — ABNORMAL LOW (ref 8.9–10.3)
Chloride: 103 mmol/L (ref 101–111)
Creatinine, Ser: 0.84 mg/dL (ref 0.61–1.24)
Glucose, Bld: 76 mg/dL (ref 65–99)
POTASSIUM: 4.1 mmol/L (ref 3.5–5.1)
Sodium: 136 mmol/L (ref 135–145)
TOTAL PROTEIN: 6.7 g/dL (ref 6.5–8.1)

## 2015-05-05 LAB — CBC WITH DIFFERENTIAL/PLATELET
BASOS PCT: 6 %
Basophils Absolute: 0.2 10*3/uL — ABNORMAL HIGH (ref 0–0.1)
Eosinophils Absolute: 0.1 10*3/uL (ref 0–0.7)
Eosinophils Relative: 3 %
HEMATOCRIT: 37.5 % — AB (ref 40.0–52.0)
Hemoglobin: 12.7 g/dL — ABNORMAL LOW (ref 13.0–18.0)
LYMPHS PCT: 22 %
Lymphs Abs: 0.7 10*3/uL — ABNORMAL LOW (ref 1.0–3.6)
MCH: 31.6 pg (ref 26.0–34.0)
MCHC: 33.8 g/dL (ref 32.0–36.0)
MCV: 93.6 fL (ref 80.0–100.0)
MONO ABS: 0.4 10*3/uL (ref 0.2–1.0)
MONOS PCT: 11 %
NEUTROS ABS: 1.9 10*3/uL (ref 1.4–6.5)
Neutrophils Relative %: 58 %
Platelets: 212 10*3/uL (ref 150–440)
RBC: 4 MIL/uL — ABNORMAL LOW (ref 4.40–5.90)
RDW: 17.5 % — AB (ref 11.5–14.5)
WBC: 3.2 10*3/uL — ABNORMAL LOW (ref 3.8–10.6)

## 2015-05-05 NOTE — Progress Notes (Signed)
[place] OFFICE PROGRESS NOTE  Patient Care Team: Petra Kuba, MD as PCP - General (Family Medicine)   SUMMARY OF ONCOLOGIC HISTORY:  # SEP 2015- MULTIPLE MYELOMA [multiple PET pos Bone lesions; hypercalcemia s/p BMBx; FISH- Aneuploidy - gain of chromosome 7,9,15,FGFR3/4p16.3, and CCND1/11q13. Loss of MAF/16q23.1), cytogenetics normal 46XY] s/p Vel-Dex-Rev-Zometa; Excellent PR;   # MARCH 2016- REV-DEX ; FEB 2017-[discn Dex] Rev 25 mg 3 w On & 1 w Off; March 2017- M- protein 0.4gm/dl; K/L= 7.9; cont Rev  # March 2017-  Left Middle Lobe cavitary lesion- ~53m- repeat Ct in 3-473m# Zometa q 6 w  # Chronic pain  # COPD/smoking/ [[facility]- not candidate for BMT- sec to co-morbidities/poor nutritional status];     INTERVAL HISTORY:  [age]ear old male patient with above history of multiple myeloma on maintenance Revlimid noted to have significant worsening of his chronic back pain over the last few weeks.  Patient is on fentanyl 137.5 g/ Dilaudid 2 mg every 4-6 hours- but noted to have worsening low back pain with radiation to his bilateral lower extremities. Complains of tingling and numbness- chronic in the feet. He wakes up in the middle of the night because of pain. He denies any bladder or bowel incontinence.  Has lost weight. Appetite is poor. Complains of fatigue. Denies any worsening shortness of breath or cough. Continues to have chronic cough. Unfortunately continues to smoke.  No high-grade fevers.    REVIEW OF SYSTEMS:  A complete 10 point review of system is done which is negative except mentioned above/history of present illness.   PAST MEDICAL HISTORY :  Past Medical History  Diagnosis Date  . Multiple myeloma ([facility]  . Multiple myeloma (HCA[facility]  . Anxiety   . CHF (congestive heart failure) (HCHav[facility]  . COPD (chronic obstructive pulmonary disease) ([facility]CM[facility] [place]  . GERD (gastroesophageal reflux disease)   . Substance abuse   . Myocardial infarction (HCRosita  .  Coronary artery disease   . Atrial septal defect   . Hyperlipidemia   . Dysrhythmias     PAST SURGICAL HISTORY :  No past surgical history on file.  FAMILY HISTORY :   Family History  Problem Relation Age of Onset  . COPD Mother   . CAD Mother   . Heart attack Father     SOCIAL HISTORY:   Social History  Substance Use Topics  . Smoking status: Current Every Day Smoker -- 0.50 packs/day for 40 years    Types: Cigarettes  . Smokeless tobacco: Never Used  . Alcohol Use: No    ALLERGIES:  has No Known Allergies.  MEDICATIONS:  Current Outpatient Prescriptions  Medication Sig Dispense Refill  . albuterol (PROVENTIL HFA;VENTOLIN HFA) 108 (90 BASE) MCG/ACT inhaler Inhale 2 puffs into the lungs every 6 (six) hours as needed for wheezing or shortness of breath.    . ALPRAZolam (XANAX) 0.5 MG tablet Take 1-2 tablets (0.5-1 mg total) by mouth 3 (three) times daily as needed for anxiety. 90 tablet 0  . aspirin 325 MG EC tablet Take 325 mg by mouth daily.    . carvedilol (COREG) 6.25 MG tablet Take 6.25 mg by mouth 2 (two) times daily.     . Marland Kitchenocusate sodium (COLACE) 100 MG capsule Take 100 mg by mouth 2 (two) times daily. Reported on 04/07/2015    . fentaNYL (DURAGESIC - DOSED MCG/HR) 100 MCG/HR Place 1 patch (100 mcg total) onto the skin every other day. Pt  uses in addition to a 17mg and 216m patch. 15 patch 0  . fentaNYL (DURAGESIC - DOSED MCG/HR) 12 MCG/HR Place 1 patch (12.5 mcg total) onto the skin every other day. Pt uses in addition to a 259mand 100m80match. 15 patch 0  . fentaNYL (DURAGESIC - DOSED MCG/HR) 25 MCG/HR patch Place 1 patch (25 mcg total) onto the skin every other day. Pt uses in addition to a 12mc62md 100mcg38mch. 15 patch 0  . Fluticasone-Salmeterol (ADVAIR) 100-50 MCG/DOSE AEPB Inhale 1 puff into the lungs 2 (two) times daily.    . gabaMarland Kitchenentin (NEURONTIN) 100 MG capsule Take 1 capsule (100 mg total) by mouth 3 (three) times daily. 90 capsule 3  . HYDROmorphone  (DILAUDID) 2 MG tablet Take 1-2 tablets (2-4 mg total) by mouth every 4 (four) hours as needed for severe pain. 120 tablet 0  . ipratropium-albuterol (DUONEB) 0.5-2.5 (3) MG/3ML SOLN Take 3 mLs by nebulization every 4 (four) hours as needed. 360 mL 3  . lenalidomide (REVLIMID) 25 MG capsule Take 25 mg by mouth daily. Reported on 01/27/2015    . polyethylene glycol (MIRALAX / GLYCOLAX) packet Take 17 g by mouth daily. Reported on 04/07/2015    . promethazine (PHENERGAN) 25 MG tablet Take 1 tablet (25 mg total) by mouth every 4 (four) hours as needed for nausea or vomiting. 60 tablet 2  . tiotropium (SPIRIVA) 18 MCG inhalation capsule Place 1 capsule (18 mcg total) into inhaler and inhale daily. 30 capsule 12  . atorvastatin (LIPITOR) 20 MG tablet Take 20 mg by mouth daily. Reported on 05/05/2015    . collagenase (SANTYL) ointment Apply topically daily. (Patient not taking: Reported on 05/05/2015) 15 g 0   No current facility-administered medications for this visit.   Facility-Administered Medications Ordered in Other Visits  Medication Dose Route Frequency Provider Last Rate Last Dose  . 0.9 %  sodium chloride infusion   Intravenous Continuous GovindCammie Sickle0 mL/hr at 02/24/15 1520      PHYSICAL EXAMINATION: ECOG PERFORMANCE STATUS: 2 - Symptomatic, <50% confined to bed  BP 110/72 mmHg  Pulse 92  Temp(Src) 98.1 F (36.7 C) (Tympanic)  Resp 18  Wt 130 lb 11.7 oz (59.3 kg)  Filed Weights   05/05/15 1526  Weight: 130 lb 11.7 oz (59.3 kg)    GENERAL: Moderately nourished moderately CAUCASIAN male patient. Alert, in  Mild- nod pain; .  Accompanied by family;he is walking by himself. He seems to be in pain getting up out of the chair. EYES: no pallor or icterus OROPHARYNX: no thrush or ulceration; poor dentition. NECK: supple, no masses felt LYMPH:  no palpable lymphadenopathy in the cervical, axillary or inguinal regions LUNGS: With decreased bilateral breath sounds. No wheeze or  crackles HEART/CVS: regular rate & rhythm and no murmurs;  No lower extremity edema. ABDOMEN:abdomen soft, non-tender and normal bowel sounds Musculoskeletal:no cyanosis of digits and no clubbing  PSYCH: alert & oriented x 3 with fluent speech NEURO: no focal motor/sensory deficits SKIN:  no rashes or significant lesions  LABORATORY DATA:  I have reviewed the data as listed    Component Value Date/Time   NA 137 04/05/2015 0909   NA 135 05/12/2014 1343   K 3.7 04/05/2015 0909   K 3.3* 05/12/2014 1343   CL 102 04/05/2015 0909   CL 101 05/12/2014 1343   CO2 31 04/05/2015 0909   CO2 27 05/12/2014 1343   GLUCOSE 108* 04/05/2015 0909   GLUCOSE 77  05/12/2014 1343   BUN 9 04/05/2015 0909   BUN 9 05/12/2014 1343   CREATININE 0.93 04/05/2015 0909   CREATININE 0.89 05/12/2014 1343   CALCIUM 9.0 04/05/2015 0909   CALCIUM 8.8* 05/12/2014 1343   PROT 6.6 04/05/2015 0909   PROT 5.9* 02/17/2014 1405   ALBUMIN 3.4* 04/05/2015 0909   ALBUMIN 2.8* 02/17/2014 1405   AST 12* 04/05/2015 0909   AST 9* 02/17/2014 1405   ALT 8* 04/05/2015 0909   ALT 12* 02/17/2014 1405   ALKPHOS 34* 04/05/2015 0909   ALKPHOS 44* 02/17/2014 1405   BILITOT 0.5 04/05/2015 0909   BILITOT 0.2 02/17/2014 1405   GFRNONAA >60 04/05/2015 0909   GFRNONAA >60 05/12/2014 1343   GFRNONAA >60 02/17/2014 1405   GFRAA >60 04/05/2015 0909   GFRAA >60 05/12/2014 1343   GFRAA >60 02/17/2014 1405    No results found for: SPEP, UPEP  Lab Results  Component Value Date   WBC 3.2* 05/05/2015   NEUTROABS 1.9 05/05/2015   HGB 12.7* 05/05/2015   HCT 37.5* 05/05/2015   MCV 93.6 05/05/2015   PLT 212 05/05/2015      Chemistry      Component Value Date/Time   NA 137 04/05/2015 0909   NA 135 05/12/2014 1343   K 3.7 04/05/2015 0909   K 3.3* 05/12/2014 1343   CL 102 04/05/2015 0909   CL 101 05/12/2014 1343   CO2 31 04/05/2015 0909   CO2 27 05/12/2014 1343   BUN 9 04/05/2015 0909   BUN 9 05/12/2014 1343   CREATININE  0.93 04/05/2015 0909   CREATININE 0.89 05/12/2014 1343      Component Value Date/Time   CALCIUM 9.0 04/05/2015 0909   CALCIUM 8.8* 05/12/2014 1343   ALKPHOS 34* 04/05/2015 0909   ALKPHOS 44* 02/17/2014 1405   AST 12* 04/05/2015 0909   AST 9* 02/17/2014 1405   ALT 8* 04/05/2015 0909   ALT 12* 02/17/2014 1405   BILITOT 0.5 04/05/2015 0909   BILITOT 0.2 02/17/2014 1405         Ref Range 85moago  37mogo  52m90moo  55mo555mo     Kappa free light chain 3.30 - 19.40 mg/L 81.44 (H) 79.18 (H) 75.71 (H) 115.73 (H)    Lamda free light chains 5.71 - 26.30 mg/L 10.24 7.86 7.48 6.32    Kappa, lamda light chain ratio 0.26 - 1.65  7.95 (H) 10.07 (H)CM 10.12 (H)CM 18.31 (H)CM   Comments: (NOTE)          Component     Latest Ref Rng 07/21/2014 10/13/2014 01/06/2015 04/05/2015            8:58 AM  IgG (Immunoglobin G), Serum     700 - 1600 mg/dL   764 781  IgA     90 - 386 mg/dL   58 (L) 72 (L)  IgM, Serum     20 - 172 mg/dL   48 24  Total Protein ELP     6.0 - 8.5 g/dL 5.6 (L) 5.9 (L) 5.7 (L) 5.6 (L)  Albumin SerPl Elph-Mcnc     2.9 - 4.4 g/dL   2.9   Alpha 1     0.0 - 0.4 g/dL   0.3   Alpha2 Glob SerPl Elph-Mcnc     0.4 - 1.0 g/dL   0.9   B-Globulin SerPl Elph-Mcnc     0.7 - 1.3 g/dL   0.8   Gamma Glob SerPl Elph-Mcnc  0.4 - 1.8 g/dL   0.8   M Protein SerPl Elph-Mcnc     Not Observed g/dL   0.5 (H)   Globulin, Total     2.2 - 3.9 g/dL 2.6 3.0 2.8   Albumin/Glob SerPl     0.7 - 1.7   1.1   IFE 1        Comment   Please Note (HCV):        Comment    Component     Latest Ref Rng 04/05/2015         9:07 AM  IgG (Immunoglobin G), Serum     700 - 1600 mg/dL 791  IgA     90 - 386 mg/dL 73 (L)  IgM, Serum     20 - 172 mg/dL 23  Total Protein ELP     6.0 - 8.5 g/dL 5.4 (L)  Albumin SerPl Elph-Mcnc     2.9 - 4.4 g/dL 2.9  Alpha 1     0.0 - 0.4 g/dL 0.3  Alpha2 Glob SerPl Elph-Mcnc     0.4 - 1.0 g/dL 0.8  B-Globulin SerPl Elph-Mcnc     0.7 - 1.3 g/dL 0.7  Gamma  Glob SerPl Elph-Mcnc     0.4 - 1.8 g/dL 0.8  M Protein SerPl Elph-Mcnc     Not Observed g/dL 0.4 (H)  Globulin, Total     2.2 - 3.9 g/dL 2.5  Albumin/Glob SerPl     0.7 - 1.7 1.2  IFE 1      Comment  Please Note (HCV):      Comment    ASSESSMENT & PLAN:   # MULTIPLE MYELOMA- status post induction Velcade Revlimid and dexamethasone with a very good partial response. Patient is currently on Revlimid 25 mg 3 weeks on and one-week off.    #  Given the worsening back pain question progression of multiple myeloma versus arthritic pain. However Most recent  M protein March  2017-  0.4 M spike;  Light chain ratio 7.95. For now  I recommend continued Revlimid 25 mg 3 weeks on 1 week off.  CBC -mild anemia hemoglobin around 12. CMP-within normal limits.  # I recommend a PET scan for further evaluation for the worsening of his lower back. Patient cannot get MRI because of pacemaker/defibrillator.  # Left middle lobe cystic lesion about 12 mm. with a PET scan   # chronic pain/anxiety- on fentanyl and Dilaudid; Xanax. Continue current medication; again await above PET scan results.   # Patient will see me in approximately 2 days or so after his PET scan.      Cammie Sickle, MD 05/05/2015 3:50 PM

## 2015-05-05 NOTE — Progress Notes (Signed)
Patient states he is having lower back pain.  States he has no appetite.  Also reports only one BM in a week.  Patient on O2 (2-3 L) at home.

## 2015-05-12 ENCOUNTER — Ambulatory Visit: Payer: Medicaid Other

## 2015-05-14 ENCOUNTER — Other Ambulatory Visit: Payer: Self-pay | Admitting: Internal Medicine

## 2015-05-14 ENCOUNTER — Inpatient Hospital Stay: Payer: Medicaid Other | Admitting: Internal Medicine

## 2015-05-14 ENCOUNTER — Other Ambulatory Visit: Payer: Self-pay | Admitting: *Deleted

## 2015-05-14 ENCOUNTER — Ambulatory Visit: Payer: Medicaid Other

## 2015-05-14 DIAGNOSIS — C9000 Multiple myeloma not having achieved remission: Secondary | ICD-10-CM

## 2015-05-17 ENCOUNTER — Other Ambulatory Visit: Payer: Self-pay | Admitting: *Deleted

## 2015-05-17 ENCOUNTER — Inpatient Hospital Stay: Payer: Medicaid Other | Admitting: Internal Medicine

## 2015-05-17 DIAGNOSIS — J4489 Other specified chronic obstructive pulmonary disease: Secondary | ICD-10-CM

## 2015-05-17 DIAGNOSIS — J449 Chronic obstructive pulmonary disease, unspecified: Secondary | ICD-10-CM

## 2015-05-17 DIAGNOSIS — C9000 Multiple myeloma not having achieved remission: Secondary | ICD-10-CM

## 2015-05-17 DIAGNOSIS — F419 Anxiety disorder, unspecified: Secondary | ICD-10-CM

## 2015-05-17 MED ORDER — GABAPENTIN 100 MG PO CAPS: 100.0000 mg | ORAL_CAPSULE | Freq: Three times a day (TID) | ORAL | Status: DC

## 2015-05-17 MED ORDER — ALPRAZOLAM 0.5 MG PO TABS: 0.5000 mg | ORAL_TABLET | Freq: Three times a day (TID) | ORAL | Status: DC | PRN

## 2015-05-17 NOTE — Telephone Encounter (Signed)
Will rf gabapentin, and give enough Xanax to last until appt on Wed, will refill Dilaudid Wed also. Patient informed.

## 2015-05-17 NOTE — Telephone Encounter (Signed)
He last got # 120 Hydromorphone and # 90 Alprazolam on 4/3. States he only has 8 tabs of Hydromorphone left. He states he is using 2 tabs in AM, 1 at midday and 2 tabs at HS. For his Alprazolam, he reports taking 2 tabs in AM, 1 midday and 2 at HS and an extra one if he awakens in night. He has 4 patches each of Fentanyl 12 mcg, 25 mcg and 100 mcg on hand

## 2015-05-18 ENCOUNTER — Ambulatory Visit
Admission: RE | Admit: 2015-05-18 | Discharge: 2015-05-18 | Disposition: A | Discharge status: Home / Self Care | Payer: Medicaid Other | Source: Ambulatory Visit | Attending: Internal Medicine | Admitting: Internal Medicine

## 2015-05-18 ENCOUNTER — Other Ambulatory Visit: Payer: Self-pay | Admitting: Internal Medicine

## 2015-05-18 DIAGNOSIS — C9000 Multiple myeloma not having achieved remission: Secondary | ICD-10-CM | POA: Diagnosis not present

## 2015-05-18 DIAGNOSIS — M8588 Other specified disorders of bone density and structure, other site: Secondary | ICD-10-CM | POA: Diagnosis not present

## 2015-05-19 ENCOUNTER — Inpatient Hospital Stay: Payer: Medicaid Other

## 2015-05-19 ENCOUNTER — Other Ambulatory Visit: Payer: Medicaid Other

## 2015-05-19 ENCOUNTER — Ambulatory Visit: Payer: Medicaid Other | Admitting: Internal Medicine

## 2015-05-19 ENCOUNTER — Inpatient Hospital Stay (HOSPITAL_BASED_OUTPATIENT_CLINIC_OR_DEPARTMENT_OTHER): Payer: Medicaid Other | Admitting: Internal Medicine

## 2015-05-19 VITALS — BP 98/70 | HR 81 | Temp 97.2°F | Wt 130.3 lb

## 2015-05-19 DIAGNOSIS — J449 Chronic obstructive pulmonary disease, unspecified: Secondary | ICD-10-CM | POA: Diagnosis not present

## 2015-05-19 DIAGNOSIS — R630 Anorexia: Secondary | ICD-10-CM

## 2015-05-19 DIAGNOSIS — R634 Abnormal weight loss: Secondary | ICD-10-CM

## 2015-05-19 DIAGNOSIS — G629 Polyneuropathy, unspecified: Secondary | ICD-10-CM

## 2015-05-19 DIAGNOSIS — R911 Solitary pulmonary nodule: Secondary | ICD-10-CM | POA: Diagnosis not present

## 2015-05-19 DIAGNOSIS — C9000 Multiple myeloma not having achieved remission: Secondary | ICD-10-CM

## 2015-05-19 DIAGNOSIS — D649 Anemia, unspecified: Secondary | ICD-10-CM

## 2015-05-19 DIAGNOSIS — G8929 Other chronic pain: Secondary | ICD-10-CM

## 2015-05-19 DIAGNOSIS — M545 Low back pain: Secondary | ICD-10-CM

## 2015-05-19 DIAGNOSIS — R05 Cough: Secondary | ICD-10-CM

## 2015-05-19 DIAGNOSIS — F419 Anxiety disorder, unspecified: Secondary | ICD-10-CM

## 2015-05-19 LAB — CBC WITH DIFFERENTIAL/PLATELET
BASOS ABS: 0.1 10*3/uL (ref 0–0.1)
BASOS PCT: 2 %
EOS ABS: 0.2 10*3/uL (ref 0–0.7)
Eosinophils Relative: 8 %
HCT: 36.2 % — ABNORMAL LOW (ref 40.0–52.0)
Hemoglobin: 12.3 g/dL — ABNORMAL LOW (ref 13.0–18.0)
Lymphocytes Relative: 31 %
Lymphs Abs: 1 10*3/uL (ref 1.0–3.6)
MCH: 32.2 pg (ref 26.0–34.0)
MCHC: 34 g/dL (ref 32.0–36.0)
MCV: 94.8 fL (ref 80.0–100.0)
MONOS PCT: 10 %
Monocytes Absolute: 0.3 10*3/uL (ref 0.2–1.0)
NEUTROS PCT: 49 %
Neutro Abs: 1.6 10*3/uL (ref 1.4–6.5)
Platelets: 183 10*3/uL (ref 150–440)
RBC: 3.82 MIL/uL — ABNORMAL LOW (ref 4.40–5.90)
RDW: 17.2 % — ABNORMAL HIGH (ref 11.5–14.5)
WBC: 3.2 10*3/uL — ABNORMAL LOW (ref 3.8–10.6)

## 2015-05-19 LAB — COMPREHENSIVE METABOLIC PANEL
ALK PHOS: 29 U/L — AB (ref 38–126)
ALT: 8 U/L — AB (ref 17–63)
AST: 12 U/L — ABNORMAL LOW (ref 15–41)
Albumin: 3.4 g/dL — ABNORMAL LOW (ref 3.5–5.0)
Anion gap: 6 (ref 5–15)
BUN: 9 mg/dL (ref 6–20)
CALCIUM: 8.6 mg/dL — AB (ref 8.9–10.3)
CO2: 29 mmol/L (ref 22–32)
CREATININE: 0.86 mg/dL (ref 0.61–1.24)
Chloride: 106 mmol/L (ref 101–111)
Glucose, Bld: 83 mg/dL (ref 65–99)
Potassium: 3.3 mmol/L — ABNORMAL LOW (ref 3.5–5.1)
Sodium: 141 mmol/L (ref 135–145)
Total Bilirubin: 0.5 mg/dL (ref 0.3–1.2)
Total Protein: 6.1 g/dL — ABNORMAL LOW (ref 6.5–8.1)

## 2015-05-19 MED ORDER — FENTANYL 100 MCG/HR TD PT72: 100.0000 ug | MEDICATED_PATCH | TRANSDERMAL | Status: DC

## 2015-05-19 MED ORDER — HYDROMORPHONE HCL 2 MG PO TABS: 2.0000 mg | ORAL_TABLET | ORAL | Status: DC | PRN

## 2015-05-19 MED ORDER — FENTANYL 25 MCG/HR TD PT72: 25.0000 ug | MEDICATED_PATCH | TRANSDERMAL | Status: DC

## 2015-05-19 MED ORDER — ALPRAZOLAM 0.5 MG PO TABS: 0.5000 mg | ORAL_TABLET | Freq: Three times a day (TID) | ORAL | Status: DC | PRN

## 2015-05-19 MED ORDER — FENTANYL 12 MCG/HR TD PT72: 12.0000 ug | MEDICATED_PATCH | TRANSDERMAL | Status: DC

## 2015-05-19 NOTE — Progress Notes (Signed)
[place] OFFICE PROGRESS NOTE  Patient Care Team: Petra Kuba, MD as PCP - General (Family Medicine)   SUMMARY OF ONCOLOGIC HISTORY:  # SEP 2015- MULTIPLE MYELOMA [multiple PET pos Bone lesions; hypercalcemia s/p BMBx; FISH- Aneuploidy - gain of chromosome 7,9,15,FGFR3/4p16.3, and CCND1/11q13. Loss of MAF/16q23.1), cytogenetics normal 46XY] s/p Vel-Dex-Rev-Zometa; Excellent PR;   # MARCH 2016- REV-DEX ; FEB 2017-[discn Dex] Rev 25 mg 3 w On & 1 w Off; March 2017- M- protein 0.4gm/dl; K/L= 7.9; cont Rev  # March 2017-  Left Middle Lobe cavitary lesion- ~41m- repeat Ct in 3-468m# Zometa q 6 w  # Chronic pain  # COPD/smoking/ [[facility]- not candidate for BMT- sec to co-morbidities/poor nutritional status];     INTERVAL HISTORY:  [age]ear old male patient with above history of multiple myeloma on maintenance Revlimid noted to have significant worsening of his chronic back pain over the last few weeks/ history to review the results of his CT scan.  Patient continues to be on fentanyl 137.5 g/ Dilaudid 2 mg every 4-6 hours- but noted to have worsening low back pain with radiation to his bilateral lower extremities.  Complains of tingling and numbness- chronic. Continues to complain of significant anxiety issues associated with nausea. His been taking Xanax.  Has lost weight. Appetite is poor. Complains of fatigue. Denies any worsening shortness of breath or cough. Continues to have chronic cough. Unfortunately continues to smoke.  No high-grade fevers.   REVIEW OF SYSTEMS:  A complete 10 point review of system is done which is negative except mentioned above/history of present illness.   PAST MEDICAL HISTORY :  Past Medical History  Diagnosis Date  . Multiple myeloma (HCSarat[place]  . Multiple myeloma (HCAng[facility]  . Anxiety   . CHF (congestive heart failure) (HCIndian[facility]  . COPD (chronic obstructive pulmonary disease) (HCFre[place]  . GERD (gastroesophageal reflux disease)   . Substance  abuse   . Myocardial infarction (HCGerr[place]  . Coronary artery disease   . Atrial septal defect   . Hyperlipidemia   . Dysrhythmias     PAST SURGICAL HISTORY :  No past surgical history on file.  FAMILY HISTORY :   Family History  Problem Relation Age of Onset  . COPD Mother   . CAD Mother   . Heart attack Father     SOCIAL HISTORY:   Social History  Substance Use Topics  . Smoking status: Current Every Day Smoker -- 0.50 packs/day for 40 years    Types: Cigarettes  . Smokeless tobacco: Never Used  . Alcohol Use: No    ALLERGIES:  has No Known Allergies.  MEDICATIONS:  Current Outpatient Prescriptions  Medication Sig Dispense Refill  . albuterol (PROVENTIL HFA;VENTOLIN HFA) 108 (90 BASE) MCG/ACT inhaler Inhale 2 puffs into the lungs every 6 (six) hours as needed for wheezing or shortness of breath.    . ALPRAZolam (XANAX) 0.5 MG tablet Take 1 tablet (0.5 mg total) by mouth 3 (three) times daily as needed for anxiety. 90 tablet 0  . aspirin 325 MG EC tablet Take 325 mg by mouth daily.    . carvedilol (COREG) 6.25 MG tablet Take 6.25 mg by mouth 2 (two) times daily.     . Marland Kitchenocusate sodium (COLACE) 100 MG capsule Take 100 mg by mouth 2 (two) times daily. Reported on 04/07/2015    . fentaNYL (DURAGESIC - DOSED MCG/HR) 25 MCG/HR patch Place 1 patch (25 mcg total) onto the skin  every other day. Pt uses in addition to a 32mg and 109m patch. 15 patch 0  . Fluticasone-Salmeterol (ADVAIR) 100-50 MCG/DOSE AEPB Inhale 1 puff into the lungs 2 (two) times daily.    . Marland Kitchenabapentin (NEURONTIN) 100 MG capsule Take 1 capsule (100 mg total) by mouth 3 (three) times daily. 90 capsule 3  . HYDROmorphone (DILAUDID) 2 MG tablet Take 1 tablet (2 mg total) by mouth every 4 (four) hours as needed for severe pain. 120 tablet 0  . ipratropium-albuterol (DUONEB) 0.5-2.5 (3) MG/3ML SOLN Take 3 mLs by nebulization every 4 (four) hours as needed. 360 mL 3  . lenalidomide (REVLIMID) 25 MG capsule Take 25 mg by  mouth daily. Reported on 01/27/2015    . pantoprazole (PROTONIX) 40 MG tablet Take 40 mg by mouth 2 (two) times daily.  2  . polyethylene glycol (MIRALAX / GLYCOLAX) packet Take 17 g by mouth daily. Reported on 04/07/2015    . promethazine (PHENERGAN) 25 MG tablet Take 1 tablet (25 mg total) by mouth every 4 (four) hours as needed for nausea or vomiting. 60 tablet 2  . tiotropium (SPIRIVA) 18 MCG inhalation capsule Place 1 capsule (18 mcg total) into inhaler and inhale daily. 30 capsule 12  . fentaNYL (DURAGESIC - DOSED MCG/HR) 100 MCG/HR Place 1 patch (100 mcg total) onto the skin every other day. Pt uses in addition to a 1298mand 34m51match. 15 patch 0  . fentaNYL (DURAGESIC - DOSED MCG/HR) 12 MCG/HR Place 1 patch (12.5 mcg total) onto the skin every other day. Pt uses in addition to a 34mc70md 100mcg33mch. 15 patch 0   No current facility-administered medications for this visit.   Facility-Administered Medications Ordered in Other Visits  Medication Dose Route Frequency Provider Last Rate Last Dose  . 0.9 %  sodium chloride infusion   Intravenous Continuous GovindCammie Sickle0 mL/hr at 02/24/15 1520      PHYSICAL EXAMINATION: ECOG PERFORMANCE STATUS: 2 - Symptomatic, <50% confined to bed  BP 98/70 mmHg  Pulse 81  Temp(Src) 97.2 F (36.2 C) (Tympanic)  Wt 130 lb 4.7 oz (59.1 kg)  Filed Weights   05/19/15 1034  Weight: 130 lb 4.7 oz (59.1 kg)    GENERAL: Thin build cachectic-appearing Caucasian male patient. Alert, in  Mild- nod pain; .  Accompanied by family;he is walking by himself EYES: no pallor or icterus OROPHARYNX: no thrush or ulceration; poor dentition. NECK: supple, no masses felt LYMPH:  no palpable lymphadenopathy in the cervical, axillary or inguinal regions LUNGS: With decreased bilateral breath sounds. No wheeze or crackles HEART/CVS: regular rate & rhythm and no murmurs;  No lower extremity edema. ABDOMEN:abdomen soft, non-tender and normal bowel  sounds Musculoskeletal:no cyanosis of digits and no clubbing  PSYCH: alert & oriented x 3 with fluent speech NEURO: no focal motor/sensory deficits SKIN:  no rashes or significant lesions  LABORATORY DATA:  I have reviewed the data as listed    Component Value Date/Time   NA 141 05/19/2015 0958   NA 135 05/12/2014 1343   K 3.3* 05/19/2015 0958   K 3.3* 05/12/2014 1343   CL 106 05/19/2015 0958   CL 101 05/12/2014 1343   CO2 29 05/19/2015 0958   CO2 27 05/12/2014 1343   GLUCOSE 83 05/19/2015 0958   GLUCOSE 77 05/12/2014 1343   BUN 9 05/19/2015 0958   BUN 9 05/12/2014 1343   CREATININE 0.86 05/19/2015 0958   CREATININE 0.89 05/12/2014 1343   CALCIUM  8.6* 05/19/2015 0958   CALCIUM 8.8* 05/12/2014 1343   PROT 6.1* 05/19/2015 0958   PROT 5.9* 02/17/2014 1405   ALBUMIN 3.4* 05/19/2015 0958   ALBUMIN 2.8* 02/17/2014 1405   AST 12* 05/19/2015 0958   AST 9* 02/17/2014 1405   ALT 8* 05/19/2015 0958   ALT 12* 02/17/2014 1405   ALKPHOS 29* 05/19/2015 0958   ALKPHOS 44* 02/17/2014 1405   BILITOT 0.5 05/19/2015 0958   BILITOT 0.2 02/17/2014 1405   GFRNONAA >60 05/19/2015 0958   GFRNONAA >60 05/12/2014 1343   GFRNONAA >60 02/17/2014 1405   GFRAA >60 05/19/2015 0958   GFRAA >60 05/12/2014 1343   GFRAA >60 02/17/2014 1405    No results found for: SPEP, UPEP  Lab Results  Component Value Date   WBC 3.2* 05/19/2015   NEUTROABS 1.6 05/19/2015   HGB 12.3* 05/19/2015   HCT 36.2* 05/19/2015   MCV 94.8 05/19/2015   PLT 183 05/19/2015      Chemistry      Component Value Date/Time   NA 141 05/19/2015 0958   NA 135 05/12/2014 1343   K 3.3* 05/19/2015 0958   K 3.3* 05/12/2014 1343   CL 106 05/19/2015 0958   CL 101 05/12/2014 1343   CO2 29 05/19/2015 0958   CO2 27 05/12/2014 1343   BUN 9 05/19/2015 0958   BUN 9 05/12/2014 1343   CREATININE 0.86 05/19/2015 0958   CREATININE 0.89 05/12/2014 1343      Component Value Date/Time   CALCIUM 8.6* 05/19/2015 0958   CALCIUM 8.8*  05/12/2014 1343   ALKPHOS 29* 05/19/2015 0958   ALKPHOS 44* 02/17/2014 1405   AST 12* 05/19/2015 0958   AST 9* 02/17/2014 1405   ALT 8* 05/19/2015 0958   ALT 12* 02/17/2014 1405   BILITOT 0.5 05/19/2015 0958   BILITOT 0.2 02/17/2014 1405         Ref Range 30moago  372mogo  20m48moo  86mo80mo     Kappa free light chain 3.30 - 19.40 mg/L 81.44 (H) 79.18 (H) 75.71 (H) 115.73 (H)    Lamda free light chains 5.71 - 26.30 mg/L 10.24 7.86 7.48 6.32    Kappa, lamda light chain ratio 0.26 - 1.65  7.95 (H) 10.07 (H)CM 10.12 (H)CM 18.31 (H)CM   Comments: (NOTE)          Component     Latest Ref Rng 07/21/2014 10/13/2014 01/06/2015 04/05/2015            8:58 AM  IgG (Immunoglobin G), Serum     700 - 1600 mg/dL   764 781  IgA     90 - 386 mg/dL   58 (L) 72 (L)  IgM, Serum     20 - 172 mg/dL   48 24  Total Protein ELP     6.0 - 8.5 g/dL 5.6 (L) 5.9 (L) 5.7 (L) 5.6 (L)  Albumin SerPl Elph-Mcnc     2.9 - 4.4 g/dL   2.9   Alpha 1     0.0 - 0.4 g/dL   0.3   Alpha2 Glob SerPl Elph-Mcnc     0.4 - 1.0 g/dL   0.9   B-Globulin SerPl Elph-Mcnc     0.7 - 1.3 g/dL   0.8   Gamma Glob SerPl Elph-Mcnc     0.4 - 1.8 g/dL   0.8   M Protein SerPl Elph-Mcnc     Not Observed g/dL   0.5 (H)   Globulin,  Total     2.2 - 3.9 g/dL 2.6 3.0 2.8   Albumin/Glob SerPl     0.7 - 1.7   1.1   IFE 1        Comment   Please Note (HCV):        Comment    Component     Latest Ref Rng 04/05/2015         9:07 AM  IgG (Immunoglobin G), Serum     700 - 1600 mg/dL 791  IgA     90 - 386 mg/dL 73 (L)  IgM, Serum     20 - 172 mg/dL 23  Total Protein ELP     6.0 - 8.5 g/dL 5.4 (L)  Albumin SerPl Elph-Mcnc     2.9 - 4.4 g/dL 2.9  Alpha 1     0.0 - 0.4 g/dL 0.3  Alpha2 Glob SerPl Elph-Mcnc     0.4 - 1.0 g/dL 0.8  B-Globulin SerPl Elph-Mcnc     0.7 - 1.3 g/dL 0.7  Gamma Glob SerPl Elph-Mcnc     0.4 - 1.8 g/dL 0.8  M Protein SerPl Elph-Mcnc     Not Observed g/dL 0.4 (H)  Globulin, Total     2.2 -  3.9 g/dL 2.5  Albumin/Glob SerPl     0.7 - 1.7 1.2  IFE 1      Comment  Please Note (HCV):      Comment    ASSESSMENT & PLAN:   # MULTIPLE MYELOMA- status post induction Velcade Revlimid and dexamethasone with a very good partial response. Patient is currently on Revlimid 25 mg 3 weeks on and one-week off.  Most recent  M protein March  2017-  0.4 M spike;  Light chain ratio 7.95. For now  I recommend continued Revlimid 25 mg 3 weeks on 1 week off.   # Sacral lesion noted left-sided soft tissue mass- concerning for myeloma recurrence. In also multiple lesions noted- more prominent lytic lesion in the left acetabulum. I would recommend a PET scan for further evaluation. I will also review the images at the tumor conference. Recommend evaluation with radiation oncology.  # chronic pain/anxiety- on fentanyl and Dilaudid; Xanax. Continue current medication; new prescription given.  # Hypocalcemia calcium 8.6. Patient not taking calcium and vitamin D. Recommend taking calcium with vitamin D. Plan Zometa in 2 weeks.  # Follow-up in approximately 2 weeks/ plan Zometa at that time.  # 25 minutes face-to-face with the patient discussing the above plan of care; more than 50% of time spent on prognosis/ natural history; counseling and coordination.     Cammie Sickle, MD 05/19/2015 5:02 PM

## 2015-05-21 ENCOUNTER — Other Ambulatory Visit: Payer: Self-pay | Admitting: Internal Medicine

## 2015-05-24 ENCOUNTER — Ambulatory Visit
Admission: RE | Admit: 2015-05-24 | Discharge: 2015-05-24 | Disposition: A | Discharge status: Home / Self Care | Payer: Medicaid Other | Source: Ambulatory Visit | Attending: Internal Medicine | Admitting: Internal Medicine

## 2015-05-24 ENCOUNTER — Ambulatory Visit
Admission: RE | Admit: 2015-05-24 | Discharge: 2015-05-24 | Disposition: A | Discharge status: Home / Self Care | Payer: Medicaid Other | Source: Ambulatory Visit | Attending: Radiation Oncology | Admitting: Radiation Oncology

## 2015-05-24 ENCOUNTER — Encounter: Payer: Self-pay | Admitting: Radiation Oncology

## 2015-05-24 VITALS — BP 107/73 | HR 79 | Temp 97.1°F | Resp 20 | Wt 130.5 lb

## 2015-05-24 DIAGNOSIS — R937 Abnormal findings on diagnostic imaging of other parts of musculoskeletal system: Secondary | ICD-10-CM | POA: Diagnosis not present

## 2015-05-24 DIAGNOSIS — C9000 Multiple myeloma not having achieved remission: Secondary | ICD-10-CM | POA: Diagnosis not present

## 2015-05-24 DIAGNOSIS — M4850XA Collapsed vertebra, not elsewhere classified, site unspecified, initial encounter for fracture: Secondary | ICD-10-CM | POA: Diagnosis not present

## 2015-05-24 DIAGNOSIS — F419 Anxiety disorder, unspecified: Secondary | ICD-10-CM | POA: Insufficient documentation

## 2015-05-24 DIAGNOSIS — J449 Chronic obstructive pulmonary disease, unspecified: Secondary | ICD-10-CM | POA: Diagnosis not present

## 2015-05-24 LAB — GLUCOSE, CAPILLARY: Glucose-Capillary: 75 mg/dL (ref 65–99)

## 2015-05-24 MED ORDER — FLUDEOXYGLUCOSE F - 18 (FDG) INJECTION
12.3400 | Freq: Once | INTRAVENOUS | Status: AC | PRN
  Administered 2015-05-24: 12.34 via INTRAVENOUS

## 2015-05-24 NOTE — Progress Notes (Signed)
Radiation Oncology Follow up Note  Name: [name]   Date:   05/24/2015 MRN:  [id number] DOB: Dec 31, 1960    OP NA   old patient new area for multiple myeloma   This 55 y.o. male presents to the clinic today for reevaluation of radiation therapy for palliative treatment for multiple myeloma with increasing lower back pain and bilateral hip pain with CT scan showing progression in his sacrum.  REFERRING PROVIDER: Petra Kuba, MD  HPI: Patient is a 55 year old male now out close to 2 years having completed palliative radiation therapy to his sacrum for multiple myeloma. He is currently on maintenance Revlimid presented over the past several weeks within worsening of his chronic back pain narcotic dependent.. Recent CT scan of his lumbar spine shows multiple lytic lesions consistent with myeloma and most prominent lesion in his upper sacrum and posterior left acetabulum. There are also also associated upper sacral lesion with impingement on S1 nerve root encroachment. Patient scheduled for PET scan today. He is emulating with difficulty up and asked to evaluate him for possible further palliative treatment.  COMPLICATIONS OF TREATMENT: none  FOLLOW UP COMPLIANCE: keeps appointments   PHYSICAL EXAM:  BP 107/73 mmHg  Pulse 79  Temp(Src) 97.1 F (36.2 C)  Resp 20  Wt 130 lb 8.2 oz (59.2 kg) Frail-appearing male in moderate pain discomfort. He has some decreased strength in his left lower extremity mostly secondary to guarding. Proprioception is intact no motor or sensory levels lower extremities appreciated. Deep palpation of his lower spine doesn't elicit pain. Well-developed well-nourished patient in NAD. HEENT reveals PERLA, EOMI, discs not visualized.  Oral cavity is clear. No oral mucosal lesions are identified. Neck is clear without evidence of cervical or supraclavicular adenopathy. Lungs are clear to A&P. Cardiac examination is essentially unremarkable with regular rate and  rhythm without murmur rub or thrill. Abdomen is benign with no organomegaly or masses noted. Motor sensory and DTR levels are equal and symmetric in the upper and lower extremities. Cranial nerves II through XII are grossly intact. Proprioception is intact. No peripheral adenopathy or edema is identified. No motor or sensory levels are noted. Crude visual fields are within normal range.  RADIOLOGY RESULTS: CT scan of the spine reviewed PET CT scan will be reviewed after completion today  PLAN: At this time like to review his PET CT scan and possible offer further palliative treatment either his sacrum or left hip. Will make further determination on treatment course after review of his PET/CT scan. Again I've got over risks and benefits of treatment. Increased urinary frequency diarrhea fatigue all were discussed in detail with the patient and his wife. Will probably deliver a short palliative course of radiation therapy 3000 cGy in 10 fractions to areas of hypermetabolic activity on PET CT scan focusing most likely on his sacrum and left acetabular region. I firstly set up and ordered CT simulation later this week after review of his PET/CT scan.  I would like to take this opportunity for allowing me to participate in the care of your patient.Armstead Peaks., MD

## 2015-05-27 ENCOUNTER — Ambulatory Visit: Admission: RE | Admit: 2015-05-27 | Payer: Medicaid Other | Source: Ambulatory Visit

## 2015-05-27 ENCOUNTER — Ambulatory Visit
Admission: RE | Admit: 2015-05-27 | Discharge: 2015-05-27 | Disposition: A | Discharge status: Home / Self Care | Payer: Medicaid Other | Source: Ambulatory Visit | Attending: Radiation Oncology | Admitting: Radiation Oncology

## 2015-05-31 ENCOUNTER — Other Ambulatory Visit: Payer: Self-pay | Admitting: Internal Medicine

## 2015-06-02 ENCOUNTER — Inpatient Hospital Stay: Payer: Medicaid Other

## 2015-06-02 ENCOUNTER — Inpatient Hospital Stay (HOSPITAL_BASED_OUTPATIENT_CLINIC_OR_DEPARTMENT_OTHER): Payer: Medicaid Other | Admitting: Internal Medicine

## 2015-06-02 ENCOUNTER — Inpatient Hospital Stay: Payer: Medicaid Other | Attending: Internal Medicine

## 2015-06-02 VITALS — BP 105/70 | HR 90 | Temp 97.1°F | Resp 18 | Wt 132.1 lb

## 2015-06-02 DIAGNOSIS — G8929 Other chronic pain: Secondary | ICD-10-CM

## 2015-06-02 DIAGNOSIS — J4489 Other specified chronic obstructive pulmonary disease: Secondary | ICD-10-CM

## 2015-06-02 DIAGNOSIS — K219 Gastro-esophageal reflux disease without esophagitis: Secondary | ICD-10-CM | POA: Diagnosis not present

## 2015-06-02 DIAGNOSIS — E785 Hyperlipidemia, unspecified: Secondary | ICD-10-CM | POA: Insufficient documentation

## 2015-06-02 DIAGNOSIS — C9000 Multiple myeloma not having achieved remission: Secondary | ICD-10-CM

## 2015-06-02 DIAGNOSIS — Z7982 Long term (current) use of aspirin: Secondary | ICD-10-CM

## 2015-06-02 DIAGNOSIS — K0889 Other specified disorders of teeth and supporting structures: Secondary | ICD-10-CM

## 2015-06-02 DIAGNOSIS — F419 Anxiety disorder, unspecified: Secondary | ICD-10-CM

## 2015-06-02 DIAGNOSIS — M549 Dorsalgia, unspecified: Secondary | ICD-10-CM | POA: Insufficient documentation

## 2015-06-02 DIAGNOSIS — R64 Cachexia: Secondary | ICD-10-CM | POA: Insufficient documentation

## 2015-06-02 DIAGNOSIS — F17210 Nicotine dependence, cigarettes, uncomplicated: Secondary | ICD-10-CM | POA: Diagnosis not present

## 2015-06-02 DIAGNOSIS — I2510 Atherosclerotic heart disease of native coronary artery without angina pectoris: Secondary | ICD-10-CM | POA: Insufficient documentation

## 2015-06-02 DIAGNOSIS — R110 Nausea: Secondary | ICD-10-CM | POA: Diagnosis not present

## 2015-06-02 DIAGNOSIS — Z79899 Other long term (current) drug therapy: Secondary | ICD-10-CM

## 2015-06-02 DIAGNOSIS — I252 Old myocardial infarction: Secondary | ICD-10-CM | POA: Diagnosis not present

## 2015-06-02 DIAGNOSIS — I509 Heart failure, unspecified: Secondary | ICD-10-CM | POA: Insufficient documentation

## 2015-06-02 DIAGNOSIS — J449 Chronic obstructive pulmonary disease, unspecified: Secondary | ICD-10-CM | POA: Insufficient documentation

## 2015-06-02 DIAGNOSIS — R918 Other nonspecific abnormal finding of lung field: Secondary | ICD-10-CM | POA: Diagnosis not present

## 2015-06-02 LAB — CBC WITH DIFFERENTIAL/PLATELET
BASOS ABS: 0.1 10*3/uL (ref 0–0.1)
Basophils Relative: 2 %
Eosinophils Absolute: 0.2 10*3/uL (ref 0–0.7)
Eosinophils Relative: 4 %
HEMATOCRIT: 36.7 % — AB (ref 40.0–52.0)
HEMOGLOBIN: 12.3 g/dL — AB (ref 13.0–18.0)
LYMPHS ABS: 1.3 10*3/uL (ref 1.0–3.6)
LYMPHS PCT: 35 %
MCH: 32.3 pg (ref 26.0–34.0)
MCHC: 33.7 g/dL (ref 32.0–36.0)
MCV: 95.9 fL (ref 80.0–100.0)
Monocytes Absolute: 0.5 10*3/uL (ref 0.2–1.0)
Monocytes Relative: 13 %
NEUTROS ABS: 1.7 10*3/uL (ref 1.4–6.5)
Neutrophils Relative %: 46 %
Platelets: 220 10*3/uL (ref 150–440)
RBC: 3.82 MIL/uL — AB (ref 4.40–5.90)
RDW: 17.4 % — ABNORMAL HIGH (ref 11.5–14.5)
WBC: 3.8 10*3/uL (ref 3.8–10.6)

## 2015-06-02 LAB — COMPREHENSIVE METABOLIC PANEL
ALBUMIN: 3.8 g/dL (ref 3.5–5.0)
ALT: 7 U/L — ABNORMAL LOW (ref 17–63)
ANION GAP: 5 (ref 5–15)
AST: 15 U/L (ref 15–41)
Alkaline Phosphatase: 36 U/L — ABNORMAL LOW (ref 38–126)
BILIRUBIN TOTAL: 0.3 mg/dL (ref 0.3–1.2)
BUN: 8 mg/dL (ref 6–20)
CHLORIDE: 100 mmol/L — AB (ref 101–111)
CO2: 30 mmol/L (ref 22–32)
Calcium: 8.7 mg/dL — ABNORMAL LOW (ref 8.9–10.3)
Creatinine, Ser: 0.94 mg/dL (ref 0.61–1.24)
GFR calc Af Amer: >60 mL/min (ref >60)
Glucose, Bld: 99 mg/dL (ref 65–99)
POTASSIUM: 3.7 mmol/L (ref 3.5–5.1)
Sodium: 135 mmol/L (ref 135–145)
TOTAL PROTEIN: 6.4 g/dL — AB (ref 6.5–8.1)

## 2015-06-02 MED ORDER — SODIUM CHLORIDE 0.9 % IV SOLN
Freq: Once | INTRAVENOUS | Status: AC
  Administered 2015-06-02: 14:00:00 via INTRAVENOUS
  Filled 2015-06-02: qty 1000

## 2015-06-02 MED ORDER — ZOLEDRONIC ACID 4 MG/100ML IV SOLN
4.0000 mg | Freq: Once | INTRAVENOUS | Status: AC
  Administered 2015-06-02: 4 mg via INTRAVENOUS
  Filled 2015-06-02: qty 100

## 2015-06-02 NOTE — Progress Notes (Signed)
Patient's low back pain is stable on current medication regimen.  He did have a PET scan performed on 05/24/15 and was evaluated by Dr. Baruch Gouty who told them there is no need for XRT at this time.

## 2015-06-02 NOTE — Progress Notes (Signed)
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 Loss of MAF/16q23.1), cytogenetics normal 46XY] s/p Vel-Dex-Rev-Zometa; Excellent PR;   # MARCH 2016- REV-DEX ; FEB 2017-[discn Dex] Rev 25 mg 3 w On & 1 w Off; March 2017- M- protein 0.4gm/dl; K/L= 7.9; cont Rev  # March 2017-  Left Middle Lobe cavitary lesion- ~16m- repeat Ct in 3-451m# Zometa q 6 w  # Chronic pain  # COPD/smoking/ [[facility]- not candidate for BMT- sec to co-morbidities/poor nutritional status];     INTERVAL HISTORY:  [age]ear old male patient with above history of multiple myeloma on maintenance Revlimid noted to have significant worsening of his chronic back pain over the last few weeks/ history to review the results of his PET scan.  In the interim patient was evaluated by radiation oncology in the absence of any active myeloma lesions on the PET scan radiation was not recommended.  Patient continues to be on fentanyl 137.5 g/ Dilaudid 2 mg every 4-6 hours. Patient continues to complain of pain in his sacral area with radiation to his left thigh. Complains of tingling and numbness- chronic. Continues to complain of significant anxiety issues associated with nausea. His been taking Xanax.  Steady weight. No high-grade fevers.   Complains of fatigue. Denies any worsening shortness of breath or cough. Continues to have chronic cough. Unfortunately continues to smoke.    REVIEW OF SYSTEMS:  A complete 10 point review of system is done which is negative except mentioned above/history of present illness.   PAST MEDICAL HISTORY :  Past Medical History  Diagnosis Date  . [place] ([place]  . [place] ([facility]  . Anxiety   . CHF (congestive  heart failure) ([facility]  . COPD (chronic obstructive pulmonary disease) ([facility]  . GERD (gastroesophageal reflux disease)   . Substance abuse   . Myocardial infarction ([facility]  . Coronary artery disease   . Atrial septal defect   . Hyperlipidemia   . Dysrhythmias     PAST SURGICAL HISTORY :  No past surgical history on file.  FAMILY HISTORY :   Family History  Problem Relation Age of Onset  . COPD Mother   . CAD Mother   . Heart attack Father     SOCIAL HISTORY:   Social History  Substance Use Topics  . Smoking status: Current Every Day Smoker -- 0.50 packs/day for 40 years    Types: Cigarettes  . Smokeless tobacco: Never Used  . Alcohol Use: No    ALLERGIES:  has No Known Allergies.  MEDICATIONS:  Current Outpatient Prescriptions  Medication Sig Dispense Refill  . albuterol (PROVENTIL HFA;VENTOLIN HFA) 108 (90 BASE) MCG/ACT inhaler Inhale 2 puffs into the lungs every 6 (six) hours as needed for wheezing or shortness of breath.    . ALPRAZolam (XANAX) 0.5 MG tablet Take 1 tablet (0.5 mg total) by mouth 3 (three) times daily as needed for anxiety. 90 tablet 0  . aspirin 325 MG EC tablet Take 325 mg by mouth daily.    . carvedilol (COREG) 6.25 MG tablet Take 6.25 mg by mouth 2 (two) times daily.     . Marland Kitchenocusate sodium (COLACE) 100 MG capsule Take 100 mg by mouth 2 (two) times daily.  Reported on 04/07/2015    . fentaNYL (DURAGESIC - DOSED MCG/HR) 100 MCG/HR Place 1 patch (100 mcg total) onto the skin every other day. Pt uses in addition to a 61mg and 256m patch. 15 patch 0  . fentaNYL (DURAGESIC - DOSED MCG/HR) 12 MCG/HR Place 1 patch (12.5 mcg total) onto the skin every other day. Pt uses in addition to a 2579mand 100m35match. 15 patch 0  . fentaNYL (DURAGESIC - DOSED MCG/HR) 25 MCG/HR patch Place 1 patch (25 mcg total) onto the skin every other day. Pt uses in addition to a 12mc28md 100mcg68mch. 15 patch 0  . Fluticasone-Salmeterol (ADVAIR) 100-50 MCG/DOSE AEPB Inhale 1 puff  into the lungs 2 (two) times daily.    . gabaMarland Kitchenentin (NEURONTIN) 100 MG capsule Take 1 capsule (100 mg total) by mouth 3 (three) times daily. 90 capsule 3  . HYDROmorphone (DILAUDID) 2 MG tablet Take 1 tablet (2 mg total) by mouth every 4 (four) hours as needed for severe pain. 120 tablet 0  . ipratropium-albuterol (DUONEB) 0.5-2.5 (3) MG/3ML SOLN Take 3 mLs by nebulization every 4 (four) hours as needed. 360 mL 3  . lenalidomide (REVLIMID) 25 MG capsule Take 25 mg by mouth daily. Reported on 01/27/2015    . pantoprazole (PROTONIX) 40 MG tablet Take 40 mg by mouth 2 (two) times daily.  2  . polyethylene glycol (MIRALAX / GLYCOLAX) packet Take 17 g by mouth daily. Reported on 04/07/2015    . promethazine (PHENERGAN) 25 MG tablet Take 1 tablet (25 mg total) by mouth every 4 (four) hours as needed for nausea or vomiting. 60 tablet 2  . tiotropium (SPIRIVA) 18 MCG inhalation capsule Place 1 capsule (18 mcg total) into inhaler and inhale daily. 30 capsule 12   No current facility-administered medications for this visit.   Facility-Administered Medications Ordered in Other Visits  Medication Dose Route Frequency Provider Last Rate Last Dose  . 0.9 %  sodium chloride infusion   Intravenous Continuous GovindCammie Sickle0 mL/hr at 02/24/15 1520      PHYSICAL EXAMINATION: ECOG PERFORMANCE STATUS: 2 - Symptomatic, <50% confined to bed  BP 105/70 mmHg  Pulse 90  Temp(Src) 97.1 F (36.2 C) (Tympanic)  Resp 18  Wt 132 lb 0.9 oz (59.9 kg)  Filed Weights   06/02/15 1337  Weight: 132 lb 0.9 oz (59.9 kg)    GENERAL: Thin build cachectic-appearing Caucasian male patient. Alert, in  Mild- nod pain; .  Accompanied by family;he is walking by himself EYES: no pallor or icterus OROPHARYNX: no thrush or ulceration; poor dentition. NECK: supple, no masses felt LYMPH:  no palpable lymphadenopathy in the cervical, axillary or inguinal regions LUNGS: With decreased bilateral breath sounds. No wheeze or  crackles HEART/CVS: regular rate & rhythm and no murmurs;  No lower extremity edema. ABDOMEN:abdomen soft, non-tender and normal bowel sounds Musculoskeletal:no cyanosis of digits and no clubbing  PSYCH: alert & oriented x 3 with fluent speech NEURO: no focal motor/sensory deficits SKIN:  no rashes or significant lesions  LABORATORY DATA:  I have reviewed the data as listed    Component Value Date/Time   NA 135 06/02/2015 1320   NA 135 05/12/2014 1343   K 3.7 06/02/2015 1320   K 3.3* 05/12/2014 1343   CL 100* 06/02/2015 1320   CL 101 05/12/2014 1343   CO2 30 06/02/2015 1320   CO2 27 05/12/2014 1343   GLUCOSE 99 06/02/2015 1320   GLUCOSE 77 05/12/2014 1343  BUN 8 06/02/2015 1320   BUN 9 05/12/2014 1343   CREATININE 0.94 06/02/2015 1320   CREATININE 0.89 05/12/2014 1343   CALCIUM 8.7* 06/02/2015 1320   CALCIUM 8.8* 05/12/2014 1343   PROT 6.4* 06/02/2015 1320   PROT 5.9* 02/17/2014 1405   ALBUMIN 3.8 06/02/2015 1320   ALBUMIN 2.8* 02/17/2014 1405   AST 15 06/02/2015 1320   AST 9* 02/17/2014 1405   ALT 7* 06/02/2015 1320   ALT 12* 02/17/2014 1405   ALKPHOS 36* 06/02/2015 1320   ALKPHOS 44* 02/17/2014 1405   BILITOT 0.3 06/02/2015 1320   BILITOT 0.2 02/17/2014 1405   GFRNONAA >60 06/02/2015 1320   GFRNONAA >60 05/12/2014 1343   GFRNONAA >60 02/17/2014 1405   GFRAA >60 06/02/2015 1320   GFRAA >60 05/12/2014 1343   GFRAA >60 02/17/2014 1405    No results found for: SPEP, UPEP  Lab Results  Component Value Date   WBC 3.8 06/02/2015   NEUTROABS 1.7 06/02/2015   HGB 12.3* 06/02/2015   HCT 36.7* 06/02/2015   MCV 95.9 06/02/2015   PLT 220 06/02/2015      Chemistry      Component Value Date/Time   NA 135 06/02/2015 1320   NA 135 05/12/2014 1343   K 3.7 06/02/2015 1320   K 3.3* 05/12/2014 1343   CL 100* 06/02/2015 1320   CL 101 05/12/2014 1343   CO2 30 06/02/2015 1320   CO2 27 05/12/2014 1343   BUN 8 06/02/2015 1320   BUN 9 05/12/2014 1343   CREATININE 0.94  06/02/2015 1320   CREATININE 0.89 05/12/2014 1343      Component Value Date/Time   CALCIUM 8.7* 06/02/2015 1320   CALCIUM 8.8* 05/12/2014 1343   ALKPHOS 36* 06/02/2015 1320   ALKPHOS 44* 02/17/2014 1405   AST 15 06/02/2015 1320   AST 9* 02/17/2014 1405   ALT 7* 06/02/2015 1320   ALT 12* 02/17/2014 1405   BILITOT 0.3 06/02/2015 1320   BILITOT 0.2 02/17/2014 1405         Ref Range 683moago  345mogo  83m67moo  98mo61mo     Kappa free light chain 3.30 - 19.40 mg/L 81.44 (H) 79.18 (H) 75.71 (H) 115.73 (H)    Lamda free light chains 5.71 - 26.30 mg/L 10.24 7.86 7.48 6.32    Kappa, lamda light chain ratio 0.26 - 1.65  7.95 (H) 10.07 (H)CM 10.12 (H)CM 18.31 (H)CM   Comments: (NOTE)          Component     Latest Ref Rng 07/21/2014 10/13/2014 01/06/2015 04/05/2015            8:58 AM  IgG (Immunoglobin G), Serum     700 - 1600 mg/dL   764 781  IgA     90 - 386 mg/dL   58 (L) 72 (L)  IgM, Serum     20 - 172 mg/dL   48 24  Total Protein ELP     6.0 - 8.5 g/dL 5.6 (L) 5.9 (L) 5.7 (L) 5.6 (L)  Albumin SerPl Elph-Mcnc     2.9 - 4.4 g/dL   2.9   Alpha 1     0.0 - 0.4 g/dL   0.3   Alpha2 Glob SerPl Elph-Mcnc     0.4 - 1.0 g/dL   0.9   B-Globulin SerPl Elph-Mcnc     0.7 - 1.3 g/dL   0.8   Gamma Glob SerPl Elph-Mcnc     0.4 -  1.8 g/dL   0.8   M Protein SerPl Elph-Mcnc     Not Observed g/dL   0.5 (H)   Globulin, Total     2.2 - 3.9 g/dL 2.6 3.0 2.8   Albumin/Glob SerPl     0.7 - 1.7   1.1   IFE 1        Comment   Please Note (HCV):        Comment    Component     Latest Ref Rng 04/05/2015         9:07 AM  IgG (Immunoglobin G), Serum     700 - 1600 mg/dL 791  IgA     90 - 386 mg/dL 73 (L)  IgM, Serum     20 - 172 mg/dL 23  Total Protein ELP     6.0 - 8.5 g/dL 5.4 (L)  Albumin SerPl Elph-Mcnc     2.9 - 4.4 g/dL 2.9  Alpha 1     0.0 - 0.4 g/dL 0.3  Alpha2 Glob SerPl Elph-Mcnc     0.4 - 1.0 g/dL 0.8  B-Globulin SerPl Elph-Mcnc     0.7 - 1.3 g/dL 0.7  Gamma Glob  SerPl Elph-Mcnc     0.4 - 1.8 g/dL 0.8  M Protein SerPl Elph-Mcnc     Not Observed g/dL 0.4 (H)  Globulin, Total     2.2 - 3.9 g/dL 2.5  Albumin/Glob SerPl     0.7 - 1.7 1.2  IFE 1      Comment  Please Note (HCV):      Comment    ASSESSMENT & PLAN:   # MULTIPLE MYELOMA- status post induction Velcade Revlimid and dexamethasone with a very good partial response. Patient is currently on Revlimid 25 mg 3 weeks on and one-week off.  Most recent  M protein March  2017-  0.4 M spike;  Light chain ratio 7.95. For now  I recommend continued Revlimid 25 mg 3 weeks on 1 week off. Myeloma panel from today is pending.; PET scan does not show any concerning evidence of recurrence.  # Worsening sacral pain- ? Etiology. Await orthopedic evaluation.  # chronic pain/anxiety- on fentanyl and Dilaudid; Xanax. Continue current medication.   # Hypocalcemia calcium 8.7; continue Zometa every 6 weeks. Patient is on calcium with vitamin D.   # I reviewed the images of the PET scan myself; reviewed the images with Dr. Donella Stade. We will also discuss the tumor conference.  Patient follow-up with me in 3 months with CBC CMP myeloma panel Zometa ; BMP Zometa in 6 weeks.    # 25 minutes face-to-face with the patient discussing the above plan of care; more than 50% of time spent on prognosis/ natural history; counseling and coordination.     Cammie Sickle, MD 06/02/2015 2:02 PM

## 2015-06-03 LAB — KAPPA/LAMBDA LIGHT CHAINS
Kappa free light chain: 80.65 mg/L — ABNORMAL HIGH (ref 3.30–19.40)
Kappa, lambda light chain ratio: 5.62 — ABNORMAL HIGH (ref 0.26–1.65)
Lambda free light chains: 14.36 mg/L (ref 5.71–26.30)

## 2015-06-08 LAB — MULTIPLE MYELOMA PANEL, SERUM
ALBUMIN/GLOB SERPL: 1.3 (ref 0.7–1.7)
ALPHA 1: 0.3 g/dL (ref 0.0–0.4)
ALPHA2 GLOB SERPL ELPH-MCNC: 0.7 g/dL (ref 0.4–1.0)
Albumin SerPl Elph-Mcnc: 3.3 g/dL (ref 2.9–4.4)
B-Globulin SerPl Elph-Mcnc: 0.7 g/dL (ref 0.7–1.3)
GAMMA GLOB SERPL ELPH-MCNC: 1 g/dL (ref 0.4–1.8)
GLOBULIN, TOTAL: 2.7 g/dL (ref 2.2–3.9)
IGA: 105 mg/dL (ref 90–386)
IGM, SERUM: 24 mg/dL (ref 20–172)
IgG (Immunoglobin G), Serum: 958 mg/dL (ref 700–1600)
M Protein SerPl Elph-Mcnc: 0.5 g/dL — ABNORMAL HIGH
Total Protein ELP: 6 g/dL (ref 6.0–8.5)

## 2015-06-17 ENCOUNTER — Other Ambulatory Visit: Payer: Self-pay | Admitting: *Deleted

## 2015-06-17 DIAGNOSIS — J449 Chronic obstructive pulmonary disease, unspecified: Secondary | ICD-10-CM

## 2015-06-17 DIAGNOSIS — F419 Anxiety disorder, unspecified: Secondary | ICD-10-CM

## 2015-06-17 DIAGNOSIS — C9000 Multiple myeloma not having achieved remission: Secondary | ICD-10-CM

## 2015-06-17 MED ORDER — ALPRAZOLAM 0.5 MG PO TABS: 0.5000 mg | ORAL_TABLET | Freq: Three times a day (TID) | ORAL | Status: DC | PRN

## 2015-06-17 MED ORDER — HYDROMORPHONE HCL 2 MG PO TABS
2.0000 mg | ORAL_TABLET | ORAL | Status: DC | PRN
Start: 2015-06-17 — End: 2015-07-14

## 2015-06-17 MED ORDER — FENTANYL 12 MCG/HR TD PT72: 12.0000 ug | MEDICATED_PATCH | TRANSDERMAL | Status: DC

## 2015-06-17 MED ORDER — FENTANYL 25 MCG/HR TD PT72: 25.0000 ug | MEDICATED_PATCH | TRANSDERMAL | Status: DC

## 2015-06-17 MED ORDER — FENTANYL 100 MCG/HR TD PT72: 100.0000 ug | MEDICATED_PATCH | TRANSDERMAL | Status: DC

## 2015-06-22 ENCOUNTER — Other Ambulatory Visit: Payer: Self-pay | Admitting: *Deleted

## 2015-06-22 ENCOUNTER — Encounter: Payer: Self-pay | Admitting: *Deleted

## 2015-06-22 MED ORDER — LENALIDOMIDE 25 MG PO CAPS: 25.0000 mg | ORAL_CAPSULE | Freq: Every day | ORAL | Status: DC

## 2015-06-22 NOTE — Progress Notes (Signed)
celegene auth for revlimid performed. celegene auth # [id number]. This information was faxed to [employer].

## 2015-07-02 ENCOUNTER — Other Ambulatory Visit (HOSPITAL_COMMUNITY): Payer: Self-pay | Admitting: Neurosurgery

## 2015-07-02 ENCOUNTER — Telehealth: Payer: Self-pay | Admitting: *Deleted

## 2015-07-02 DIAGNOSIS — C9000 Multiple myeloma not having achieved remission: Secondary | ICD-10-CM

## 2015-07-02 NOTE — Telephone Encounter (Signed)
To go to urgent care or ER if in severe pain, otherwise have him see NP Monday. Agrees to appt 9 am Monday  To see Luster Landsberg, NP

## 2015-07-05 ENCOUNTER — Inpatient Hospital Stay: Payer: Medicaid Other | Attending: Oncology | Admitting: Oncology

## 2015-07-05 ENCOUNTER — Telehealth: Payer: Self-pay | Admitting: *Deleted

## 2015-07-05 VITALS — BP 96/62 | HR 96 | Temp 97.0°F | Ht 72.0 in | Wt 132.2 lb

## 2015-07-05 DIAGNOSIS — G8929 Other chronic pain: Secondary | ICD-10-CM | POA: Diagnosis not present

## 2015-07-05 DIAGNOSIS — C9000 Multiple myeloma not having achieved remission: Secondary | ICD-10-CM | POA: Diagnosis present

## 2015-07-05 DIAGNOSIS — E440 Moderate protein-calorie malnutrition: Secondary | ICD-10-CM

## 2015-07-05 DIAGNOSIS — K219 Gastro-esophageal reflux disease without esophagitis: Secondary | ICD-10-CM | POA: Insufficient documentation

## 2015-07-05 DIAGNOSIS — K5900 Constipation, unspecified: Secondary | ICD-10-CM

## 2015-07-05 DIAGNOSIS — R64 Cachexia: Secondary | ICD-10-CM | POA: Diagnosis not present

## 2015-07-05 DIAGNOSIS — Z7952 Long term (current) use of systemic steroids: Secondary | ICD-10-CM

## 2015-07-05 DIAGNOSIS — R630 Anorexia: Secondary | ICD-10-CM | POA: Insufficient documentation

## 2015-07-05 DIAGNOSIS — I2510 Atherosclerotic heart disease of native coronary artery without angina pectoris: Secondary | ICD-10-CM | POA: Diagnosis not present

## 2015-07-05 DIAGNOSIS — G629 Polyneuropathy, unspecified: Secondary | ICD-10-CM

## 2015-07-05 DIAGNOSIS — I252 Old myocardial infarction: Secondary | ICD-10-CM | POA: Diagnosis not present

## 2015-07-05 DIAGNOSIS — R5383 Other fatigue: Secondary | ICD-10-CM

## 2015-07-05 DIAGNOSIS — F17210 Nicotine dependence, cigarettes, uncomplicated: Secondary | ICD-10-CM | POA: Diagnosis not present

## 2015-07-05 DIAGNOSIS — J449 Chronic obstructive pulmonary disease, unspecified: Secondary | ICD-10-CM | POA: Insufficient documentation

## 2015-07-05 DIAGNOSIS — R634 Abnormal weight loss: Secondary | ICD-10-CM | POA: Insufficient documentation

## 2015-07-05 DIAGNOSIS — M549 Dorsalgia, unspecified: Secondary | ICD-10-CM | POA: Diagnosis not present

## 2015-07-05 DIAGNOSIS — Z7982 Long term (current) use of aspirin: Secondary | ICD-10-CM

## 2015-07-05 DIAGNOSIS — E785 Hyperlipidemia, unspecified: Secondary | ICD-10-CM | POA: Diagnosis not present

## 2015-07-05 DIAGNOSIS — Z79899 Other long term (current) drug therapy: Secondary | ICD-10-CM | POA: Insufficient documentation

## 2015-07-05 DIAGNOSIS — R5381 Other malaise: Secondary | ICD-10-CM | POA: Insufficient documentation

## 2015-07-05 MED ORDER — PREDNISONE 20 MG PO TABS: 20.0000 mg | ORAL_TABLET | Freq: Every day | ORAL | Status: DC

## 2015-07-05 NOTE — Telephone Encounter (Signed)
Med not at pharmacy Resubmitted Prednisone to [facility], it was sent to [facility]

## 2015-07-05 NOTE — Progress Notes (Signed)
Patient here for follow up. Multiple concerns today, Neuropathy not being controled, constipation, pain in back.

## 2015-07-05 NOTE — Progress Notes (Signed)
[place]  [phone]) [phone]  Fax:(336) [place] DOB: [birth year]  MR#: [id number]  JYN#:[id number]  Patient Care Team: Petra Kuba, MD as PCP - General (Family Medicine)  CHIEF COMPLAINT:  Chief Complaint  Patient presents with  . multiple myloma    INTERVAL HISTORY: Patient returns to clinic today for worsening neuropathy. He takes Revlimid for Multiple Myeloma.  He reports that his feet and legs to his knees are numb and tingly and the pain is worsening making it hard for him to rest at night. He also reports decreased appetite and weight loss. He is concerned with the amount of weight he has lost. He no longer takes dexamethasone with Revlimid since having pneumonia. He states he felt better and had a much better appetite when taking the dex. He still has chronic back pain and complains of constipation. He continues to be on fentanyl 137.5 g/ Dilaudid 2 mg every 4-6 hours. He denies fever or chills.  He denies chest pain or shortness of breath.   REVIEW OF SYSTEMS:   Review of Systems  Constitutional: Positive for weight loss and malaise/fatigue.  HENT: Negative.   Eyes: Negative.   Respiratory: Negative.   Cardiovascular: Negative.   Gastrointestinal: Positive for constipation.  Musculoskeletal: Positive for back pain.  Skin: Negative.   Neurological: Positive for sensory change and weakness.    As per HPI. Otherwise, a complete review of systems is negatve.  ONCOLOGY HISTORY:  No history exists.    PAST MEDICAL HISTORY: Past Medical History  Diagnosis Date  . Multiple myeloma ([place])   . Multiple myeloma ([place])   . Anxiety   . CHF (congestive heart failure) ([place])   . COPD (chronic obstructive pulmonary disease) ([place])   . GERD (gastroesophageal reflux disease)   . Substance abuse   . Myocardial infarction ([place])   . Coronary artery disease   . Atrial septal defect   . Hyperlipidemia   . Dysrhythmias     PAST  SURGICAL HISTORY: No past surgical history on file.  FAMILY HISTORY Family History  Problem Relation Age of Onset  . COPD Mother   . CAD Mother   . Heart attack Father      ADVANCED DIRECTIVES:    HEALTH MAINTENANCE: Social History  Substance Use Topics  . Smoking status: Current Every Day Smoker -- 0.50 packs/day for 40 years    Types: Cigarettes  . Smokeless tobacco: Never Used  . Alcohol Use: No    No Known Allergies  Current Outpatient Prescriptions  Medication Sig Dispense Refill  . albuterol (PROVENTIL HFA;VENTOLIN HFA) 108 (90 BASE) MCG/ACT inhaler Inhale 2 puffs into the lungs every 6 (six) hours as needed for wheezing or shortness of breath.    . ALPRAZolam (XANAX) 0.5 MG tablet Take 1 tablet (0.5 mg total) by mouth 3 (three) times daily as needed for anxiety. 90 tablet 0  . aspirin 325 MG EC tablet Take 325 mg by mouth daily.    Marland Kitchen docusate sodium (COLACE) 100 MG capsule Take 100 mg by mouth 2 (two) times daily. Reported on 04/07/2015    . fentaNYL (DURAGESIC - DOSED MCG/HR) 100 MCG/HR Place 1 patch (100 mcg total) onto the skin every other day. Pt uses in addition to a 40mg and 275m patch. 15 patch 0  . fentaNYL (DURAGESIC - DOSED MCG/HR) 12 MCG/HR Place 1 patch (12.5 mcg total) onto the skin every other day. Pt uses in addition to a  14mg and 1067m patch. 15 patch 0  . fentaNYL (DURAGESIC - DOSED MCG/HR) 25 MCG/HR patch Place 1 patch (25 mcg total) onto the skin every other day. Pt uses in addition to a 1279mand 100m15match. 15 patch 0  . Fluticasone-Salmeterol (ADVAIR) 100-50 MCG/DOSE AEPB Inhale 1 puff into the lungs 2 (two) times daily.    . gaMarland Kitchenapentin (NEURONTIN) 100 MG capsule Take 1 capsule (100 mg total) by mouth 3 (three) times daily. 90 capsule 3  . HYDROmorphone (DILAUDID) 2 MG tablet Take 1 tablet (2 mg total) by mouth every 4 (four) hours as needed for severe pain. 120 tablet 0  . ipratropium-albuterol (DUONEB) 0.5-2.5 (3) MG/3ML SOLN Take 3 mLs by  nebulization every 4 (four) hours as needed. 360 mL 3  . lenalidomide (REVLIMID) 25 MG capsule Take 1 capsule (25 mg total) by mouth daily. For 21 days then off 7 days 21 capsule 0  . polyethylene glycol (MIRALAX / GLYCOLAX) packet Take 17 g by mouth daily. Reported on 04/07/2015    . promethazine (PHENERGAN) 25 MG tablet Take 1 tablet (25 mg total) by mouth every 4 (four) hours as needed for nausea or vomiting. 60 tablet 2  . tiotropium (SPIRIVA) 18 MCG inhalation capsule Place 1 capsule (18 mcg total) into inhaler and inhale daily. 30 capsule 12  . carvedilol (COREG) 6.25 MG tablet Take 6.25 mg by mouth 2 (two) times daily.     . predniSONE (DELTASONE) 20 MG tablet Take 1 tablet (20 mg total) by mouth daily with breakfast. 14 tablet 0   No current facility-administered medications for this visit.   Facility-Administered Medications Ordered in Other Visits  Medication Dose Route Frequency Provider Last Rate Last Dose  . 0.9 %  sodium chloride infusion   Intravenous Continuous GoviCammie Sickle 10 mL/hr at 02/24/15 1520      OBJECTIVE: BP 96/62 mmHg  Pulse 96  Temp(Src) 97 F (36.1 C) (Tympanic)  Ht 6' (1.829 m)  Wt 132 lb 3.2 oz (59.966 kg)  BMI 17.93 kg/m2   Body mass index is 17.93 kg/(m^2).    ECOG FS:1 - Symptomatic but completely ambulatory  General: Thin build cachectic-appearing Caucasian male patient. Alert. Accompanied by his wife Eyes: No pallor or icterus Oropharynx: no thrush or ulceration; poor dentition. Lungs: With decreased bilateral breath sounds. No wheeze or crackless: Clear to auscultation bilaterally. Heart: regular rate & rhythm and no murmurs; No lower extremity edema. Abdomen: Soft, nontender, nondistended. Normoactive bowel sounds. Musculoskeletal: No cyanosis, or clubbing. Neuro: No focal motor/sensory deficit Skin: No rashes or lesions Psych: Normal affect.  LAB RESULTS:  No visits with results within 3 Day(s) from this visit. Latest known  visit with results is:  Appointment on 06/02/2015  Component Date Value Ref Range Status  . Sodium 06/02/2015 135  135 - 145 mmol/L Final  . Potassium 06/02/2015 3.7  3.5 - 5.1 mmol/L Final  . Chloride 06/02/2015 100* 101 - 111 mmol/L Final  . CO2 06/02/2015 30  22 - 32 mmol/L Final  . Glucose, Bld 06/02/2015 99  65 - 99 mg/dL Final  . BUN 06/02/2015 8  6 - 20 mg/dL Final  . Creatinine, Ser 06/02/2015 0.94  0.61 - 1.24 mg/dL Final  . Calcium 06/02/2015 8.7* 8.9 - 10.3 mg/dL Final  . Total Protein 06/02/2015 6.4* 6.5 - 8.1 g/dL Final  . Albumin 06/02/2015 3.8  3.5 - 5.0 g/dL Final  . AST 06/02/2015 15  15 - 41 U/L Final  .  ALT 06/02/2015 7* 17 - 63 U/L Final  . Alkaline Phosphatase 06/02/2015 36* 38 - 126 U/L Final  . Total Bilirubin 06/02/2015 0.3  0.3 - 1.2 mg/dL Final  . GFR calc non Af Amer 06/02/2015 >60  >60 mL/min Final  . GFR calc Af Amer 06/02/2015 >60  >60 mL/min Final   Comment: (NOTE) The eGFR has been calculated using the CKD EPI equation. This calculation has not been validated in all clinical situations. eGFR's persistently <60 mL/min signify possible Chronic Kidney Disease.   . Anion gap 06/02/2015 5  5 - 15 Final  . WBC 06/02/2015 3.8  3.8 - 10.6 K/uL Final  . RBC 06/02/2015 3.82* 4.40 - 5.90 MIL/uL Final  . Hemoglobin 06/02/2015 12.3* 13.0 - 18.0 g/dL Final  . HCT 06/02/2015 36.7* 40.0 - 52.0 % Final  . MCV 06/02/2015 95.9  80.0 - 100.0 fL Final  . MCH 06/02/2015 32.3  26.0 - 34.0 pg Final  . MCHC 06/02/2015 33.7  32.0 - 36.0 g/dL Final  . RDW 06/02/2015 17.4* 11.5 - 14.5 % Final  . Platelets 06/02/2015 220  150 - 440 K/uL Final  . Neutrophils Relative % 06/02/2015 46   Final  . Neutro Abs 06/02/2015 1.7  1.4 - 6.5 K/uL Final  . Lymphocytes Relative 06/02/2015 35   Final  . Lymphs Abs 06/02/2015 1.3  1.0 - 3.6 K/uL Final  . Monocytes Relative 06/02/2015 13   Final  . Monocytes Absolute 06/02/2015 0.5  0.2 - 1.0 K/uL Final  . Eosinophils Relative 06/02/2015 4    Final  . Eosinophils Absolute 06/02/2015 0.2  0 - 0.7 K/uL Final  . Basophils Relative 06/02/2015 2   Final  . Basophils Absolute 06/02/2015 0.1  0 - 0.1 K/uL Final  . IgG (Immunoglobin G), Serum 06/02/2015 958  700 - 1600 mg/dL Final  . IgA 06/02/2015 105  90 - 386 mg/dL Final  . IgM, Serum 06/02/2015 24  20 - 172 mg/dL Final   Result confirmed on concentration.  . Total Protein ELP 06/02/2015 6.0  6.0 - 8.5 g/dL Corrected  . Albumin SerPl Elph-Mcnc 06/02/2015 3.3  2.9 - 4.4 g/dL Corrected  . Alpha 1 06/02/2015 0.3  0.0 - 0.4 g/dL Corrected  . Alpha2 Glob SerPl Elph-Mcnc 06/02/2015 0.7  0.4 - 1.0 g/dL Corrected  . B-Globulin SerPl Elph-Mcnc 06/02/2015 0.7  0.7 - 1.3 g/dL Corrected  . Gamma Glob SerPl Elph-Mcnc 06/02/2015 1.0  0.4 - 1.8 g/dL Corrected  . M Protein SerPl Elph-Mcnc 06/02/2015 0.5* Not Observed g/dL Corrected  . Globulin, Total 06/02/2015 2.7  2.2 - 3.9 g/dL Corrected  . Albumin/Glob SerPl 06/02/2015 1.3  0.7 - 1.7 Corrected  . IFE 1 06/02/2015 Comment   Corrected   Comment: (NOTE) Immunofixation shows IgG monoclonal protein with kappa light chain specificity.   . Please Note 06/02/2015 Comment   Corrected   Comment: (NOTE) Protein electrophoresis scan will follow via computer, mail, or courier delivery. Performed At: [facility] [place], [place] [id number] Lindon Romp MD WU:[id number]   . Kappa free light chain 06/02/2015 80.65* 3.30 - 19.40 mg/L Final   Comment: (NOTE) **Effective June 28, 2015 Free Kappa Lt Chains,S**  reference interval will be changing to:    3.3 - 19.4   . Lamda free light chains 06/02/2015 14.36  5.71 - 26.30 mg/L Final   Comment: (NOTE) **Effective June 28, 2015 Free Lambda Lt Chains,S**  reference interval will be changing to:  5.7 - 26.3   . Kappa, lamda light chain ratio 06/02/2015 5.62* 0.26 - 1.65 Final   Comment: (NOTE) Performed At: [facility] [place], [place]  [id number] Lindon Romp MD TT:[id number]     STUDIES: No results found.  ASSESSMENT: Multiple Myeloma, Neuropathy, Pain  PLAN:   1. Multiple Myeloma: Patient is currently on Revlimid 25 mg 3 weeks on and one-week off. Most recent M protein March 2017- 0.4 M spike; Light chain ratio 5.62 in May 2017. For now I recommend continued Revlimid 25 mg 3 weeks on 1 week off. Keep current follow up appointments for next week.  2. Neuropathy: Increase gabapentin to 200 mg TID. 3. Pain: Continue current pain medications - fentanyl and Dilaudid 4. Decreased appetite/Weight loss: 20 mg prednisone x 2 weeks sent to pharmacy. Referral sent to dietician.  Patient expressed understanding and was in agreement with this plan. He also understands that He can call clinic at any time with any questions, concerns, or complaints.   Dr. Rogue Bussing was available for consultation and review of plan of care for this patient.  Mayra Reel, NP   07/05/2015 10:13 AM

## 2015-07-12 ENCOUNTER — Telehealth: Payer: Self-pay | Admitting: *Deleted

## 2015-07-12 DIAGNOSIS — C9000 Multiple myeloma not having achieved remission: Secondary | ICD-10-CM

## 2015-07-12 MED ORDER — GABAPENTIN 100 MG PO CAPS: 200.0000 mg | ORAL_CAPSULE | Freq: Three times a day (TID) | ORAL | Status: DC

## 2015-07-12 NOTE — Telephone Encounter (Signed)
Gabapentin dose was doubled and pharamacy will not refill rx unless we call

## 2015-07-13 ENCOUNTER — Ambulatory Visit
Admission: RE | Admit: 2015-07-13 | Discharge: 2015-07-13 | Disposition: A | Discharge status: Home / Self Care | Payer: Medicaid Other | Source: Ambulatory Visit | Attending: Neurosurgery | Admitting: Neurosurgery

## 2015-07-13 DIAGNOSIS — C9000 Multiple myeloma not having achieved remission: Secondary | ICD-10-CM | POA: Insufficient documentation

## 2015-07-14 ENCOUNTER — Inpatient Hospital Stay: Payer: Medicaid Other

## 2015-07-14 ENCOUNTER — Telehealth: Payer: Self-pay | Admitting: *Deleted

## 2015-07-14 ENCOUNTER — Other Ambulatory Visit: Payer: Self-pay | Admitting: *Deleted

## 2015-07-14 VITALS — BP 99/60 | HR 87 | Temp 96.8°F | Resp 20

## 2015-07-14 DIAGNOSIS — C9000 Multiple myeloma not having achieved remission: Secondary | ICD-10-CM

## 2015-07-14 DIAGNOSIS — F419 Anxiety disorder, unspecified: Secondary | ICD-10-CM

## 2015-07-14 DIAGNOSIS — J449 Chronic obstructive pulmonary disease, unspecified: Secondary | ICD-10-CM

## 2015-07-14 LAB — BASIC METABOLIC PANEL
ANION GAP: 7 (ref 5–15)
BUN: 12 mg/dL (ref 6–20)
CALCIUM: 9.2 mg/dL (ref 8.9–10.3)
CHLORIDE: 102 mmol/L (ref 101–111)
CO2: 26 mmol/L (ref 22–32)
Creatinine, Ser: 0.83 mg/dL (ref 0.61–1.24)
GFR calc non Af Amer: >60 mL/min (ref >60)
GLUCOSE: 121 mg/dL — AB (ref 65–99)
Potassium: 4 mmol/L (ref 3.5–5.1)
Sodium: 135 mmol/L (ref 135–145)

## 2015-07-14 MED ORDER — ALPRAZOLAM 0.5 MG PO TABS: 0.5000 mg | ORAL_TABLET | Freq: Three times a day (TID) | ORAL | Status: DC | PRN

## 2015-07-14 MED ORDER — HYDROMORPHONE HCL 2 MG PO TABS: 2.0000 mg | ORAL_TABLET | ORAL | Status: DC | PRN

## 2015-07-14 MED ORDER — ZOLEDRONIC ACID 4 MG/100ML IV SOLN
4.0000 mg | Freq: Once | INTRAVENOUS | Status: AC
  Administered 2015-07-14: 4 mg via INTRAVENOUS
  Filled 2015-07-14: qty 100

## 2015-07-14 NOTE — Telephone Encounter (Signed)
Patient here in clinic asking for prescriptions for Fentanyl, hydromorphone and xanax.  Asked patient to have a seat in waiting area until MD can be contacted.

## 2015-07-15 NOTE — Telephone Encounter (Signed)
Called patient to inquire if he received his prescription for Fentanyl. States he has enough for 10 days and will call back when he needs refill.

## 2015-07-21 ENCOUNTER — Other Ambulatory Visit: Payer: Self-pay | Admitting: *Deleted

## 2015-07-21 DIAGNOSIS — J449 Chronic obstructive pulmonary disease, unspecified: Secondary | ICD-10-CM

## 2015-07-21 DIAGNOSIS — C9000 Multiple myeloma not having achieved remission: Secondary | ICD-10-CM

## 2015-07-21 DIAGNOSIS — F419 Anxiety disorder, unspecified: Secondary | ICD-10-CM

## 2015-07-21 MED ORDER — FENTANYL 25 MCG/HR TD PT72: 25.0000 ug | MEDICATED_PATCH | TRANSDERMAL | Status: DC

## 2015-07-21 MED ORDER — FENTANYL 100 MCG/HR TD PT72: 100.0000 ug | MEDICATED_PATCH | TRANSDERMAL | Status: DC

## 2015-07-21 MED ORDER — FENTANYL 12 MCG/HR TD PT72
12.0000 ug | MEDICATED_PATCH | TRANSDERMAL | Status: DC
Start: 2015-07-21 — End: 2015-08-23

## 2015-08-02 ENCOUNTER — Other Ambulatory Visit: Payer: Self-pay | Admitting: Internal Medicine

## 2015-08-03 NOTE — Telephone Encounter (Signed)
Patient stated that he wanted a refill of prednisone to help with appetite.  Dr. Rogue Bussing contacted and did not want Rx refilled due to increase risk of infection. Information given to patient in person when they came in to check on status of refill.  Patient was supposed to have nutrition referral but stated that he has not received any information regarding that or has not had anybody contact him.  Would like to have more information about seeing a nutritionist if possible

## 2015-08-12 ENCOUNTER — Other Ambulatory Visit: Payer: Self-pay | Admitting: *Deleted

## 2015-08-12 DIAGNOSIS — C9000 Multiple myeloma not having achieved remission: Secondary | ICD-10-CM

## 2015-08-12 DIAGNOSIS — J449 Chronic obstructive pulmonary disease, unspecified: Secondary | ICD-10-CM

## 2015-08-12 DIAGNOSIS — F419 Anxiety disorder, unspecified: Secondary | ICD-10-CM

## 2015-08-12 DIAGNOSIS — J4489 Other specified chronic obstructive pulmonary disease: Secondary | ICD-10-CM

## 2015-08-12 MED ORDER — ALPRAZOLAM 0.5 MG PO TABS: 0.5000 mg | ORAL_TABLET | Freq: Three times a day (TID) | ORAL | Status: DC | PRN

## 2015-08-12 MED ORDER — HYDROMORPHONE HCL 2 MG PO TABS: 2.0000 mg | ORAL_TABLET | ORAL | Status: DC | PRN

## 2015-08-18 ENCOUNTER — Telehealth: Payer: Self-pay | Admitting: *Deleted

## 2015-08-18 NOTE — Telephone Encounter (Signed)
Rx refaxed with auth # by Vickki Muff, RN, BSN

## 2015-08-18 NOTE — Telephone Encounter (Addendum)
Prescription was faxed to them, but there is no [employer] authorization number on prescription

## 2015-08-23 ENCOUNTER — Other Ambulatory Visit: Payer: Self-pay

## 2015-08-23 ENCOUNTER — Telehealth: Payer: Self-pay

## 2015-08-23 DIAGNOSIS — J449 Chronic obstructive pulmonary disease, unspecified: Secondary | ICD-10-CM

## 2015-08-23 DIAGNOSIS — F419 Anxiety disorder, unspecified: Secondary | ICD-10-CM

## 2015-08-23 DIAGNOSIS — C9000 Multiple myeloma not having achieved remission: Secondary | ICD-10-CM

## 2015-08-23 MED ORDER — FENTANYL 100 MCG/HR TD PT72: 100.0000 ug | MEDICATED_PATCH | TRANSDERMAL | 0 refills | Status: DC

## 2015-08-23 MED ORDER — FENTANYL 12 MCG/HR TD PT72: 12.0000 ug | MEDICATED_PATCH | TRANSDERMAL | 0 refills | Status: DC

## 2015-08-23 MED ORDER — FENTANYL 25 MCG/HR TD PT72: 25.0000 ug | MEDICATED_PATCH | TRANSDERMAL | 0 refills | Status: DC

## 2015-08-23 NOTE — Telephone Encounter (Signed)
Patient to pick up refill today.

## 2015-08-27 ENCOUNTER — Telehealth: Payer: Self-pay | Admitting: *Deleted

## 2015-08-27 DIAGNOSIS — C9000 Multiple myeloma not having achieved remission: Secondary | ICD-10-CM

## 2015-08-27 MED ORDER — GABAPENTIN 100 MG PO CAPS: 200.0000 mg | ORAL_CAPSULE | Freq: Three times a day (TID) | ORAL | 0 refills | Status: DC

## 2015-08-27 NOTE — Telephone Encounter (Signed)
Informed that prescription is ready to pick up  

## 2015-09-02 ENCOUNTER — Inpatient Hospital Stay: Payer: Medicaid Other | Attending: Internal Medicine

## 2015-09-02 ENCOUNTER — Other Ambulatory Visit: Payer: Self-pay | Admitting: *Deleted

## 2015-09-02 DIAGNOSIS — E785 Hyperlipidemia, unspecified: Secondary | ICD-10-CM | POA: Diagnosis not present

## 2015-09-02 DIAGNOSIS — K219 Gastro-esophageal reflux disease without esophagitis: Secondary | ICD-10-CM | POA: Insufficient documentation

## 2015-09-02 DIAGNOSIS — Q211 Atrial septal defect: Secondary | ICD-10-CM | POA: Insufficient documentation

## 2015-09-02 DIAGNOSIS — F17210 Nicotine dependence, cigarettes, uncomplicated: Secondary | ICD-10-CM | POA: Insufficient documentation

## 2015-09-02 DIAGNOSIS — I2510 Atherosclerotic heart disease of native coronary artery without angina pectoris: Secondary | ICD-10-CM | POA: Insufficient documentation

## 2015-09-02 DIAGNOSIS — I209 Angina pectoris, unspecified: Secondary | ICD-10-CM | POA: Diagnosis not present

## 2015-09-02 DIAGNOSIS — I252 Old myocardial infarction: Secondary | ICD-10-CM | POA: Insufficient documentation

## 2015-09-02 DIAGNOSIS — J449 Chronic obstructive pulmonary disease, unspecified: Secondary | ICD-10-CM | POA: Diagnosis not present

## 2015-09-02 DIAGNOSIS — Z7982 Long term (current) use of aspirin: Secondary | ICD-10-CM | POA: Diagnosis not present

## 2015-09-02 DIAGNOSIS — C9000 Multiple myeloma not having achieved remission: Secondary | ICD-10-CM | POA: Diagnosis present

## 2015-09-02 DIAGNOSIS — G8929 Other chronic pain: Secondary | ICD-10-CM | POA: Diagnosis not present

## 2015-09-02 DIAGNOSIS — Z79899 Other long term (current) drug therapy: Secondary | ICD-10-CM | POA: Insufficient documentation

## 2015-09-02 DIAGNOSIS — F419 Anxiety disorder, unspecified: Secondary | ICD-10-CM | POA: Insufficient documentation

## 2015-09-02 LAB — COMPREHENSIVE METABOLIC PANEL
ALBUMIN: 3.7 g/dL (ref 3.5–5.0)
ALK PHOS: 37 U/L — AB (ref 38–126)
ALT: 7 U/L — ABNORMAL LOW (ref 17–63)
ANION GAP: 9 (ref 5–15)
AST: 12 U/L — ABNORMAL LOW (ref 15–41)
BILIRUBIN TOTAL: 0.5 mg/dL (ref 0.3–1.2)
BUN: 9 mg/dL (ref 6–20)
CALCIUM: 8.5 mg/dL — AB (ref 8.9–10.3)
CO2: 27 mmol/L (ref 22–32)
CREATININE: 0.82 mg/dL (ref 0.61–1.24)
Chloride: 99 mmol/L — ABNORMAL LOW (ref 101–111)
GFR calc non Af Amer: >60 mL/min (ref >60)
GLUCOSE: 101 mg/dL — AB (ref 65–99)
Potassium: 3.8 mmol/L (ref 3.5–5.1)
Sodium: 135 mmol/L (ref 135–145)
TOTAL PROTEIN: 6.7 g/dL (ref 6.5–8.1)

## 2015-09-02 LAB — CBC WITH DIFFERENTIAL/PLATELET
Basophils Absolute: 0 10*3/uL (ref 0–0.1)
Basophils Relative: 1 %
Eosinophils Absolute: 0.2 10*3/uL (ref 0–0.7)
Eosinophils Relative: 5 %
HEMATOCRIT: 35.5 % — AB (ref 40.0–52.0)
HEMOGLOBIN: 12.2 g/dL — AB (ref 13.0–18.0)
LYMPHS ABS: 1.2 10*3/uL (ref 1.0–3.6)
Lymphocytes Relative: 24 %
MCH: 32.5 pg (ref 26.0–34.0)
MCHC: 34.3 g/dL (ref 32.0–36.0)
MCV: 94.6 fL (ref 80.0–100.0)
MONOS PCT: 17 %
Monocytes Absolute: 0.8 10*3/uL (ref 0.2–1.0)
NEUTROS ABS: 2.6 10*3/uL (ref 1.4–6.5)
NEUTROS PCT: 53 %
Platelets: 220 10*3/uL (ref 150–440)
RBC: 3.75 MIL/uL — ABNORMAL LOW (ref 4.40–5.90)
RDW: 14.5 % (ref 11.5–14.5)
WBC: 4.8 10*3/uL (ref 3.8–10.6)

## 2015-09-02 MED ORDER — PROMETHAZINE HCL 25 MG PO TABS: 25.0000 mg | ORAL_TABLET | ORAL | 2 refills | Status: DC | PRN

## 2015-09-02 NOTE — Addendum Note (Signed)
Addended by: Betti Cruz on: 09/02/2015 03:55 PM   Modules accepted: Orders

## 2015-09-09 ENCOUNTER — Inpatient Hospital Stay: Payer: Medicaid Other

## 2015-09-09 ENCOUNTER — Inpatient Hospital Stay (HOSPITAL_BASED_OUTPATIENT_CLINIC_OR_DEPARTMENT_OTHER): Payer: Medicaid Other | Admitting: Internal Medicine

## 2015-09-09 VITALS — BP 103/73 | HR 75 | Temp 97.1°F | Resp 18 | Wt 132.0 lb

## 2015-09-09 DIAGNOSIS — E785 Hyperlipidemia, unspecified: Secondary | ICD-10-CM

## 2015-09-09 DIAGNOSIS — G8929 Other chronic pain: Secondary | ICD-10-CM

## 2015-09-09 DIAGNOSIS — F419 Anxiety disorder, unspecified: Secondary | ICD-10-CM | POA: Diagnosis not present

## 2015-09-09 DIAGNOSIS — I252 Old myocardial infarction: Secondary | ICD-10-CM

## 2015-09-09 DIAGNOSIS — C9002 Multiple myeloma in relapse: Secondary | ICD-10-CM | POA: Insufficient documentation

## 2015-09-09 DIAGNOSIS — I2510 Atherosclerotic heart disease of native coronary artery without angina pectoris: Secondary | ICD-10-CM

## 2015-09-09 DIAGNOSIS — Z79899 Other long term (current) drug therapy: Secondary | ICD-10-CM

## 2015-09-09 DIAGNOSIS — C9000 Multiple myeloma not having achieved remission: Secondary | ICD-10-CM | POA: Diagnosis not present

## 2015-09-09 DIAGNOSIS — J449 Chronic obstructive pulmonary disease, unspecified: Secondary | ICD-10-CM

## 2015-09-09 DIAGNOSIS — K219 Gastro-esophageal reflux disease without esophagitis: Secondary | ICD-10-CM

## 2015-09-09 DIAGNOSIS — Q211 Atrial septal defect: Secondary | ICD-10-CM

## 2015-09-09 DIAGNOSIS — F17210 Nicotine dependence, cigarettes, uncomplicated: Secondary | ICD-10-CM

## 2015-09-09 DIAGNOSIS — I209 Angina pectoris, unspecified: Secondary | ICD-10-CM

## 2015-09-09 DIAGNOSIS — Z7982 Long term (current) use of aspirin: Secondary | ICD-10-CM

## 2015-09-09 LAB — BASIC METABOLIC PANEL
Anion gap: 6 (ref 5–15)
BUN: 11 mg/dL (ref 6–20)
CALCIUM: 8.8 mg/dL — AB (ref 8.9–10.3)
CHLORIDE: 101 mmol/L (ref 101–111)
CO2: 29 mmol/L (ref 22–32)
CREATININE: 1.07 mg/dL (ref 0.61–1.24)
GFR calc Af Amer: >60 mL/min (ref >60)
GFR calc non Af Amer: >60 mL/min (ref >60)
GLUCOSE: 84 mg/dL (ref 65–99)
Potassium: 3.8 mmol/L (ref 3.5–5.1)
Sodium: 136 mmol/L (ref 135–145)

## 2015-09-09 MED ORDER — AMOXICILLIN-POT CLAVULANATE 875-125 MG PO TABS
1.0000 | ORAL_TABLET | Freq: Two times a day (BID) | ORAL | 0 refills | Status: DC
Start: 2015-09-09 — End: 2015-11-10

## 2015-09-09 NOTE — Progress Notes (Signed)
[facility] OFFICE PROGRESS NOTE  Patient Care Team: [name] Kuba, MD as PCP - General (Family Medicine)   SUMMARY OF ONCOLOGIC HISTORY:  Oncology History   # SEP 2015- MULTIPLE MYELOMA [multiple PET pos Bone lesions; hypercalcemia s/p BMBx; FISH- Aneuploidy - gain of chromosome 7,9,15,FGFR3/4p16.3, and CCND1/11q13. Loss of MAF/16q23.1), cytogenetics normal 46XY] s/p Vel-Dex-Rev-Zometa; Excellent PR;   # MARCH 2016- REV-DEX ; FEB 2017-[discn Dex] Rev 25 mg 3 w On & 1 w Off; March 2017- M- protein 0.4gm/dl; K/L= 7.9; cont Rev  # March 2017-  Left Middle Lobe cavitary lesion- ~42m- repeat Ct in 3-435m# AUG 2017- ONJ [[name], [place]]- DISCONT Zometa.   # Chronic pain/Anxiety [ortho]  # COPD/smoking/ [[facility]- not candidate for BMT- sec to co-morbidities/poor nutritional status];      [facility]      INTERVAL HISTORY:  [age]ear old male patient with above history of multiple myeloma on maintenance Revlimid Is here for follow-up.  Patient continues to complain of chronic back pain upper and lower; chronic anxiety. He has met with orthopedics no aggressive interventions recommended. Unfortunately his anxiety worse- since his girlfriend's diagnosis of metastatic lung cancer  Patient continues to be on fentanyl 137.5 g/ Dilaudid 2 mg every 4-6 hours.  Complains of tingling and numbness- chronic.  His been taking Xanax.  Steady weight. No high-grade fevers.   Complains of fatigue. Denies any worsening shortness of breath or cough. Continues to have chronic cough. Unfortunately continues to smoke.    REVIEW OF SYSTEMS:  A complete 10 point review of system is done which is negative except mentioned above/history of present illness.   PAST MEDICAL HISTORY :  Past Medical History:  Diagnosis Date  . Anxiety   . Atrial septal defect   . CHF (congestive heart failure) ([place]  . COPD [place]ch[place] obstructive pulmonary disease) ([place]  . Coronary  artery disease   . Dysrhythmias   . GERD (gastroesophageal reflux disease)   . Hyperlipidemia   . Multiple [place]oma ([facility]  . Multiple myeloma ([facility]  . Myocardial infarction ([facility]  . Substance abuse     PAST SURGICAL HISTORY :  No past surgical history on file.  FAMILY HISTORY :   Family History  Problem Relation Age of Onset  . COPD Mother   . CAD Mother   . Heart attack Father     SOCIAL HISTORY:   Social History  Substance Use Topics  . Smoking status: Current Every Day Smoker    Packs/day: 0.50    Years: 40.00    Types: Cigarettes  . Smokeless tobacco: Never Used  . Alcohol use No    ALLERGIES:  has No Known Allergies.  MEDICATIONS:  Current Outpatient Prescriptions  Medication Sig Dispense Refill  . albuterol (PROVENTIL HFA;VENTOLIN HFA) 108 (90 BASE) MCG/ACT inhaler Inhale 2 puffs into the lungs every 6 (six) hours as needed for wheezing or shortness of breath.    . ALPRAZolam (XANAX) 0.5 MG tablet Take 1 tablet (0.5 mg total) by mouth 3 (three) times daily as needed for anxiety. 90 tablet 0  . aspirin 325 MG EC tablet Take 325 mg by mouth daily.    . carvedilol (COREG) 6.25 MG tablet Take 6.25 mg by mouth 2 (two) times daily.     . Marland Kitchenocusate sodium (COLACE) 100 MG capsule Take 100 mg by mouth 2 (two) times daily. Reported on 04/07/2015    . fentaNYL (DURAGESIC -  DOSED MCG/HR) 100 MCG/HR Place 1 patch (100 mcg total) onto the skin every other day. Pt uses in addition to a 8mg and 242m patch. 15 patch 0  . fentaNYL (DURAGESIC - DOSED MCG/HR) 12 MCG/HR Place 1 patch (12.5 mcg total) onto the skin every other day. Pt uses in addition to a 2522mand 100m70match. 15 patch 0  . fentaNYL (DURAGESIC - DOSED MCG/HR) 25 MCG/HR patch Place 1 patch (25 mcg total) onto the skin every other day. Pt uses in addition to a 12mc52md 100mcg22mch. 15 patch 0  . Fluticasone-Salmeterol (ADVAIR) 100-50 MCG/DOSE AEPB Inhale 1 puff into the lungs 2 (two) times daily.    . gabaMarland Kitchenentin  (NEURONTIN) 100 MG capsule Take 2 capsules (200 mg total) by mouth 3 (three) times daily. 180 capsule 0  . HYDROmorphone (DILAUDID) 2 MG tablet Take 1 tablet (2 mg total) by mouth every 4 (four) hours as needed for severe pain. 120 tablet 0  . ipratropium-albuterol (DUONEB) 0.5-2.5 (3) MG/3ML SOLN Take 3 mLs by nebulization every 4 (four) hours as needed. 360 mL 3  . lenalidomide (REVLIMID) 25 MG capsule Take 1 capsule (25 mg total) by mouth daily. For 21 days then off 7 days 21 capsule 0  . polyethylene glycol (MIRALAX / GLYCOLAX) packet Take 17 g by mouth daily. Reported on 04/07/2015    . predniSONE (DELTASONE) 20 MG tablet Take 1 tablet (20 mg total) by mouth daily with breakfast. 14 tablet 0  . promethazine (PHENERGAN) 25 MG tablet Take 1 tablet (25 mg total) by mouth every 4 (four) hours as needed for nausea or vomiting. 60 tablet 2  . tiotropium (SPIRIVA) 18 MCG inhalation capsule Place 1 capsule (18 mcg total) into inhaler and inhale daily. 30 capsule 12  . amoxicillin-clavulanate (AUGMENTIN) 875-125 MG tablet Take 1 tablet by mouth 2 (two) times daily. 60 tablet 0  . chlorhexidine (PERIDEX) 0.12 % solution SWISS AND EXPECTORATE 10 MLS BY MOUTH TWICE DAILY  0  . HYDROcodone-acetaminophen (NORCO/VICODIN) 5-325 MG tablet TAKE ONE TABLET BY MOUTH EVERY 4 TO 6 HOURS AS NEEDED FOR PAIN  0  . ibuprofen (ADVIL,MOTRIN) 600 MG tablet Take 600 mg by mouth every 6 (six) hours as needed. for pain  0   No current facility-administered medications for this visit.    Facility-Administered Medications Ordered in Other Visits  Medication Dose Route Frequency Provider Last Rate Last Dose  . 0.9 %  sodium chloride infusion   Intravenous Continuous GovindCammie Sickle0 mL/hr at 02/24/15 1520      PHYSICAL EXAMINATION: ECOG PERFORMANCE STATUS: 2 - Symptomatic, <50% confined to bed  BP 103/73 (BP Location: Left Arm, Patient Position: Sitting)   Pulse 75   Temp 97.1 F (36.2 C) (Tympanic)   Resp 18    Wt 132 lb (59.9 kg)   BMI 17.90 kg/m   Filed Weights   09/09/15 1406  Weight: 132 lb (59.9 kg)    GENERAL: Thin build cachectic-appearing Caucasian male patient. Alert, in  Mild- nod pain; .  Accompanied by family;he is walking by himself EYES: no pallor or icterus OROPHARYNX: Jaw bone exposed left lower jaw poor dentition. NECK: supple, no masses felt LYMPH:  no palpable lymphadenopathy in the cervical, axillary or inguinal regions LUNGS: With decreased bilateral breath sounds. No wheeze or crackles HEART/CVS: regular rate & rhythm and no murmurs;  No lower extremity edema. ABDOMEN:abdomen soft, non-tender and normal bowel sounds Musculoskeletal:no cyanosis of digits and no clubbing  PSYCH: alert & oriented x 3 with fluent speech NEURO: no focal motor/sensory deficits SKIN:  no rashes or significant lesions  LABORATORY DATA:  I have reviewed the data as listed    Component Value Date/Time   NA 136 09/09/2015 1333   NA 135 05/12/2014 1343   K 3.8 09/09/2015 1333   K 3.3 (L) 05/12/2014 1343   CL 101 09/09/2015 1333   CL 101 05/12/2014 1343   CO2 29 09/09/2015 1333   CO2 27 05/12/2014 1343   GLUCOSE 84 09/09/2015 1333   GLUCOSE 77 05/12/2014 1343   BUN 11 09/09/2015 1333   BUN 9 05/12/2014 1343   CREATININE 1.07 09/09/2015 1333   CREATININE 0.89 05/12/2014 1343   CALCIUM 8.8 (L) 09/09/2015 1333   CALCIUM 8.8 (L) 05/12/2014 1343   PROT 6.7 09/02/2015 1315   PROT 5.9 (L) 02/17/2014 1405   ALBUMIN 3.7 09/02/2015 1315   ALBUMIN 2.8 (L) 02/17/2014 1405   AST 12 (L) 09/02/2015 1315   AST 9 (L) 02/17/2014 1405   ALT 7 (L) 09/02/2015 1315   ALT 12 (L) 02/17/2014 1405   ALKPHOS 37 (L) 09/02/2015 1315   ALKPHOS 44 (L) 02/17/2014 1405   BILITOT 0.5 09/02/2015 1315   BILITOT 0.2 02/17/2014 1405   GFRNONAA >60 09/09/2015 1333   GFRNONAA >60 05/12/2014 1343   GFRAA >60 09/09/2015 1333   GFRAA >60 05/12/2014 1343    No results found for: SPEP, UPEP  Lab Results    Component Value Date   WBC 4.8 09/02/2015   NEUTROABS 2.6 09/02/2015   HGB 12.2 (L) 09/02/2015   HCT 35.5 (L) 09/02/2015   MCV 94.6 09/02/2015   PLT 220 09/02/2015      Chemistry      Component Value Date/Time   NA 136 09/09/2015 1333   NA 135 05/12/2014 1343   K 3.8 09/09/2015 1333   K 3.3 (L) 05/12/2014 1343   CL 101 09/09/2015 1333   CL 101 05/12/2014 1343   CO2 29 09/09/2015 1333   CO2 27 05/12/2014 1343   BUN 11 09/09/2015 1333   BUN 9 05/12/2014 1343   CREATININE 1.07 09/09/2015 1333   CREATININE 0.89 05/12/2014 1343      Component Value Date/Time   CALCIUM 8.8 (L) 09/09/2015 1333   CALCIUM 8.8 (L) 05/12/2014 1343   ALKPHOS 37 (L) 09/02/2015 1315   ALKPHOS 44 (L) 02/17/2014 1405   AST 12 (L) 09/02/2015 1315   AST 9 (L) 02/17/2014 1405   ALT 7 (L) 09/02/2015 1315   ALT 12 (L) 02/17/2014 1405   BILITOT 0.5 09/02/2015 1315   BILITOT 0.2 02/17/2014 1405         Ref Range 34moago  370mogo  72m7moo  51mo30mo     Kappa free light chain 3.30 - 19.40 mg/L 81.44 (H) 79.18 (H) 75.71 (H) 115.73 (H)    Lamda free light chains 5.71 - 26.30 mg/L 10.24 7.86 7.48 6.32    Kappa, lamda light chain ratio 0.26 - 1.65  7.95 (H) 10.07 (H)CM 10.12 (H)CM 18.31 (H)CM   Comments: (NOTE)          Component     Latest Ref Rng 07/21/2014 10/13/2014 01/06/2015 04/05/2015            8:58 AM  IgG (Immunoglobin G), Serum     700 - 1600 mg/dL   764 781  IgA     90 - 386 mg/dL   58 (L) 72 (  L)  IgM, Serum     20 - 172 mg/dL   48 24  Total Protein ELP     6.0 - 8.5 g/dL 5.6 (L) 5.9 (L) 5.7 (L) 5.6 (L)  Albumin SerPl Elph-Mcnc     2.9 - 4.4 g/dL   2.9   Alpha 1     0.0 - 0.4 g/dL   0.3   Alpha2 Glob SerPl Elph-Mcnc     0.4 - 1.0 g/dL   0.9   B-Globulin SerPl Elph-Mcnc     0.7 - 1.3 g/dL   0.8   Gamma Glob SerPl Elph-Mcnc     0.4 - 1.8 g/dL   0.8   M Protein SerPl Elph-Mcnc     Not Observed g/dL   0.5 (H)   Globulin, Total     2.2 - 3.9 g/dL 2.6 3.0 2.8   Albumin/Glob  SerPl     0.7 - 1.7   1.1   IFE 1        Comment   Please Note (HCV):        Comment    Component     Latest Ref Rng 04/05/2015         9:07 AM  IgG (Immunoglobin G), Serum     700 - 1600 mg/dL 791  IgA     90 - 386 mg/dL 73 (L)  IgM, Serum     20 - 172 mg/dL 23  Total Protein ELP     6.0 - 8.5 g/dL 5.4 (L)  Albumin SerPl Elph-Mcnc     2.9 - 4.4 g/dL 2.9  Alpha 1     0.0 - 0.4 g/dL 0.3  Alpha2 Glob SerPl Elph-Mcnc     0.4 - 1.0 g/dL 0.8  B-Globulin SerPl Elph-Mcnc     0.7 - 1.3 g/dL 0.7  Gamma Glob SerPl Elph-Mcnc     0.4 - 1.8 g/dL 0.8  M Protein SerPl Elph-Mcnc     Not Observed g/dL 0.4 (H)  Globulin, Total     2.2 - 3.9 g/dL 2.5  Albumin/Glob SerPl     0.7 - 1.7 1.2  IFE 1      Comment  Please Note (HCV):      Comment    ASSESSMENT & PLAN:   Multiple myeloma in relapse ([facility]) # MULTIPLE MYELOMA- status post induction Velcade Revlimid and dexamethasone with a very good partial response. Patient is currently on Revlimid 25 mg 3 weeks on and one-week off.  Most recent  M protein MAY 2017- 0.5 M spike;  Light chain ratio 7.95.   # For now  I recommend continued Revlimid 25 mg 3 weeks on 1 week off.   # chronic pain/anxiety- on fentanyl and Dilaudid; Xanax. Continue current medication.   #  ? ONJ- Left jaw- DISCONTINUE Zometa. [Dr.[phone]] start Augumentin BID x 4weeks.  Discussed with Dr. Hampton Abbot  # follow up in 2 months/ cbc/cmp/m-panel labs. M panel- today.   # 25 minutes face-to-face with the patient discussing the above plan of care; more than 50% of time spent on prognosis/ natural history; counseling and coordination.     Cammie Sickle, MD 09/09/2015 4:03 PM

## 2015-09-09 NOTE — Assessment & Plan Note (Addendum)
#  MULTIPLE MYELOMA- status post induction Velcade Revlimid and dexamethasone with a very good partial response. Patient is currently on Revlimid 25 mg 3 weeks on and one-week off.  Most recent  M protein MAY 2017- 0.5 M spike;  Light chain ratio 7.95.   # For now  I recommend continued Revlimid 25 mg 3 weeks on 1 week off.   # chronic pain/anxiety- on fentanyl and Dilaudid; Xanax. Continue current medication.   #  ? ONJ- Left jaw- DISCONTINUE Zometa. [Dr.[phone]] start Augumentin BID x 4weeks.  Discussed with Dr. Hampton Abbot  # follow up in 2 months/ cbc/cmp/m-panel labs. M panel- today.   # 25 minutes face-to-face with the patient discussing the above plan of care; more than 50% of time spent on prognosis/ natural history; counseling and coordination.

## 2015-09-09 NOTE — Progress Notes (Signed)
Patient recently had some teeth pulled on the right side and shaved the jaw bone down so the skin will grow over.  (Osteonecrosis?)  Patient states he is unable to eat and is in pain 9/10.

## 2015-09-10 LAB — KAPPA/LAMBDA LIGHT CHAINS
KAPPA FREE LGHT CHN: 67.4 mg/L — AB (ref 3.3–19.4)
Kappa, lambda light chain ratio: 3.04 — ABNORMAL HIGH (ref 0.26–1.65)
Lambda free light chains: 22.2 mg/L (ref 5.7–26.3)

## 2015-09-13 ENCOUNTER — Other Ambulatory Visit: Payer: Self-pay | Admitting: Internal Medicine

## 2015-09-13 ENCOUNTER — Other Ambulatory Visit: Payer: Self-pay | Admitting: *Deleted

## 2015-09-13 DIAGNOSIS — C9000 Multiple myeloma not having achieved remission: Secondary | ICD-10-CM

## 2015-09-13 DIAGNOSIS — J449 Chronic obstructive pulmonary disease, unspecified: Secondary | ICD-10-CM

## 2015-09-13 DIAGNOSIS — F419 Anxiety disorder, unspecified: Secondary | ICD-10-CM

## 2015-09-13 MED ORDER — HYDROMORPHONE HCL 2 MG PO TABS: 2.0000 mg | ORAL_TABLET | ORAL | 0 refills | Status: DC | PRN

## 2015-09-13 MED ORDER — ALPRAZOLAM 0.5 MG PO TABS
0.5000 mg | ORAL_TABLET | Freq: Three times a day (TID) | ORAL | 0 refills | Status: DC | PRN
Start: 2015-09-13 — End: 2015-10-11

## 2015-09-13 MED ORDER — LENALIDOMIDE 25 MG PO CAPS: 25.0000 mg | ORAL_CAPSULE | Freq: Every day | ORAL | 0 refills | Status: DC

## 2015-09-13 NOTE — Addendum Note (Signed)
Addended by: Betti Cruz on: 09/13/2015 10:54 AM   Modules accepted: Orders

## 2015-09-14 LAB — MULTIPLE MYELOMA PANEL, SERUM
ALPHA 1: 0.4 g/dL (ref 0.0–0.4)
ALPHA2 GLOB SERPL ELPH-MCNC: 0.9 g/dL (ref 0.4–1.0)
Albumin SerPl Elph-Mcnc: 3.5 g/dL (ref 2.9–4.4)
Albumin/Glob SerPl: 1.3 (ref 0.7–1.7)
B-Globulin SerPl Elph-Mcnc: 1 g/dL (ref 0.7–1.3)
GAMMA GLOB SERPL ELPH-MCNC: 0.7 g/dL (ref 0.4–1.8)
GLOBULIN, TOTAL: 2.9 g/dL (ref 2.2–3.9)
IGG (IMMUNOGLOBIN G), SERUM: 928 mg/dL (ref 700–1600)
IgA: 124 mg/dL (ref 90–386)
IgM, Serum: 46 mg/dL (ref 20–172)
M PROTEIN SERPL ELPH-MCNC: 0.3 g/dL — AB
Total Protein ELP: 6.4 g/dL (ref 6.0–8.5)

## 2015-09-20 ENCOUNTER — Other Ambulatory Visit: Payer: Self-pay | Admitting: *Deleted

## 2015-09-20 DIAGNOSIS — C9000 Multiple myeloma not having achieved remission: Secondary | ICD-10-CM

## 2015-09-20 DIAGNOSIS — F419 Anxiety disorder, unspecified: Secondary | ICD-10-CM

## 2015-09-20 DIAGNOSIS — J449 Chronic obstructive pulmonary disease, unspecified: Secondary | ICD-10-CM

## 2015-09-20 MED ORDER — FENTANYL 25 MCG/HR TD PT72: 25.0000 ug | MEDICATED_PATCH | TRANSDERMAL | 0 refills | Status: DC

## 2015-09-20 MED ORDER — FENTANYL 100 MCG/HR TD PT72: 100.0000 ug | MEDICATED_PATCH | TRANSDERMAL | 0 refills | Status: DC

## 2015-09-20 MED ORDER — FENTANYL 12 MCG/HR TD PT72: 12.0000 ug | MEDICATED_PATCH | TRANSDERMAL | 0 refills | Status: DC

## 2015-09-29 ENCOUNTER — Other Ambulatory Visit: Payer: Self-pay | Admitting: Internal Medicine

## 2015-09-29 ENCOUNTER — Other Ambulatory Visit: Payer: Self-pay | Admitting: *Deleted

## 2015-09-29 DIAGNOSIS — C9000 Multiple myeloma not having achieved remission: Secondary | ICD-10-CM

## 2015-09-29 MED ORDER — PROMETHAZINE HCL 25 MG PO TABS: 25.0000 mg | ORAL_TABLET | ORAL | 2 refills | Status: DC | PRN

## 2015-09-29 NOTE — Addendum Note (Signed)
Addended by: Jarrett Soho C on: 09/29/2015 03:00 PM   Modules accepted: Orders

## 2015-10-11 ENCOUNTER — Other Ambulatory Visit: Payer: Self-pay | Admitting: *Deleted

## 2015-10-11 DIAGNOSIS — J449 Chronic obstructive pulmonary disease, unspecified: Secondary | ICD-10-CM

## 2015-10-11 DIAGNOSIS — C9000 Multiple myeloma not having achieved remission: Secondary | ICD-10-CM

## 2015-10-11 DIAGNOSIS — F419 Anxiety disorder, unspecified: Secondary | ICD-10-CM

## 2015-10-11 MED ORDER — HYDROMORPHONE HCL 2 MG PO TABS: 2.0000 mg | ORAL_TABLET | ORAL | 0 refills | Status: DC | PRN

## 2015-10-11 MED ORDER — ALPRAZOLAM 0.5 MG PO TABS: 0.5000 mg | ORAL_TABLET | Freq: Three times a day (TID) | ORAL | 0 refills | Status: DC | PRN

## 2015-10-25 ENCOUNTER — Other Ambulatory Visit: Payer: Self-pay | Admitting: *Deleted

## 2015-10-25 DIAGNOSIS — C9000 Multiple myeloma not having achieved remission: Secondary | ICD-10-CM

## 2015-10-25 DIAGNOSIS — F419 Anxiety disorder, unspecified: Secondary | ICD-10-CM

## 2015-10-25 DIAGNOSIS — J449 Chronic obstructive pulmonary disease, unspecified: Secondary | ICD-10-CM

## 2015-10-25 MED ORDER — FENTANYL 25 MCG/HR TD PT72
25.0000 ug | MEDICATED_PATCH | TRANSDERMAL | 0 refills | Status: DC
Start: 2015-10-25 — End: 2015-11-23

## 2015-10-25 MED ORDER — FENTANYL 100 MCG/HR TD PT72: 100.0000 ug | MEDICATED_PATCH | TRANSDERMAL | 0 refills | Status: DC

## 2015-10-25 MED ORDER — FENTANYL 12 MCG/HR TD PT72: 12.0000 ug | MEDICATED_PATCH | TRANSDERMAL | 0 refills | Status: DC

## 2015-11-08 ENCOUNTER — Inpatient Hospital Stay: Payer: Medicaid Other | Attending: Internal Medicine

## 2015-11-08 DIAGNOSIS — R202 Paresthesia of skin: Secondary | ICD-10-CM | POA: Insufficient documentation

## 2015-11-08 DIAGNOSIS — Z7982 Long term (current) use of aspirin: Secondary | ICD-10-CM | POA: Insufficient documentation

## 2015-11-08 DIAGNOSIS — I252 Old myocardial infarction: Secondary | ICD-10-CM | POA: Diagnosis not present

## 2015-11-08 DIAGNOSIS — Z7951 Long term (current) use of inhaled steroids: Secondary | ICD-10-CM | POA: Diagnosis not present

## 2015-11-08 DIAGNOSIS — R05 Cough: Secondary | ICD-10-CM | POA: Diagnosis not present

## 2015-11-08 DIAGNOSIS — Z79899 Other long term (current) drug therapy: Secondary | ICD-10-CM | POA: Insufficient documentation

## 2015-11-08 DIAGNOSIS — F17210 Nicotine dependence, cigarettes, uncomplicated: Secondary | ICD-10-CM | POA: Insufficient documentation

## 2015-11-08 DIAGNOSIS — E785 Hyperlipidemia, unspecified: Secondary | ICD-10-CM | POA: Insufficient documentation

## 2015-11-08 DIAGNOSIS — F419 Anxiety disorder, unspecified: Secondary | ICD-10-CM | POA: Insufficient documentation

## 2015-11-08 DIAGNOSIS — I509 Heart failure, unspecified: Secondary | ICD-10-CM | POA: Insufficient documentation

## 2015-11-08 DIAGNOSIS — I2510 Atherosclerotic heart disease of native coronary artery without angina pectoris: Secondary | ICD-10-CM | POA: Diagnosis not present

## 2015-11-08 DIAGNOSIS — R918 Other nonspecific abnormal finding of lung field: Secondary | ICD-10-CM | POA: Diagnosis not present

## 2015-11-08 DIAGNOSIS — G8929 Other chronic pain: Secondary | ICD-10-CM | POA: Insufficient documentation

## 2015-11-08 DIAGNOSIS — K219 Gastro-esophageal reflux disease without esophagitis: Secondary | ICD-10-CM | POA: Insufficient documentation

## 2015-11-08 DIAGNOSIS — C9002 Multiple myeloma in relapse: Secondary | ICD-10-CM | POA: Insufficient documentation

## 2015-11-08 DIAGNOSIS — Q211 Atrial septal defect: Secondary | ICD-10-CM | POA: Diagnosis not present

## 2015-11-08 DIAGNOSIS — R200 Anesthesia of skin: Secondary | ICD-10-CM | POA: Diagnosis not present

## 2015-11-08 DIAGNOSIS — R5383 Other fatigue: Secondary | ICD-10-CM | POA: Diagnosis not present

## 2015-11-08 DIAGNOSIS — J449 Chronic obstructive pulmonary disease, unspecified: Secondary | ICD-10-CM | POA: Insufficient documentation

## 2015-11-08 LAB — CBC WITH DIFFERENTIAL/PLATELET
BASOS ABS: 0 10*3/uL (ref 0–0.1)
BASOS PCT: 1 %
EOS ABS: 0.2 10*3/uL (ref 0–0.7)
EOS PCT: 4 %
HEMATOCRIT: 36.3 % — AB (ref 40.0–52.0)
Hemoglobin: 12.6 g/dL — ABNORMAL LOW (ref 13.0–18.0)
Lymphocytes Relative: 26 %
Lymphs Abs: 1 10*3/uL (ref 1.0–3.6)
MCH: 33.3 pg (ref 26.0–34.0)
MCHC: 34.6 g/dL (ref 32.0–36.0)
MCV: 96.1 fL (ref 80.0–100.0)
MONO ABS: 0.5 10*3/uL (ref 0.2–1.0)
MONOS PCT: 13 %
NEUTROS ABS: 2.2 10*3/uL (ref 1.4–6.5)
Neutrophils Relative %: 56 %
PLATELETS: 221 10*3/uL (ref 150–440)
RBC: 3.77 MIL/uL — ABNORMAL LOW (ref 4.40–5.90)
RDW: 16.6 % — AB (ref 11.5–14.5)
WBC: 4 10*3/uL (ref 3.8–10.6)

## 2015-11-08 LAB — COMPREHENSIVE METABOLIC PANEL
ALBUMIN: 3.7 g/dL (ref 3.5–5.0)
ALT: 7 U/L — ABNORMAL LOW (ref 17–63)
ANION GAP: 8 (ref 5–15)
AST: 13 U/L — AB (ref 15–41)
Alkaline Phosphatase: 37 U/L — ABNORMAL LOW (ref 38–126)
BILIRUBIN TOTAL: 0.5 mg/dL (ref 0.3–1.2)
BUN: 8 mg/dL (ref 6–20)
CALCIUM: 8.6 mg/dL — AB (ref 8.9–10.3)
CHLORIDE: 102 mmol/L (ref 101–111)
CO2: 29 mmol/L (ref 22–32)
CREATININE: 0.83 mg/dL (ref 0.61–1.24)
GFR calc Af Amer: >60 mL/min (ref >60)
GFR calc non Af Amer: >60 mL/min (ref >60)
GLUCOSE: 100 mg/dL — AB (ref 65–99)
POTASSIUM: 3.6 mmol/L (ref 3.5–5.1)
SODIUM: 139 mmol/L (ref 135–145)
Total Protein: 6.6 g/dL (ref 6.5–8.1)

## 2015-11-09 LAB — KAPPA/LAMBDA LIGHT CHAINS
KAPPA, LAMDA LIGHT CHAIN RATIO: 2.74 — AB (ref 0.26–1.65)
Kappa free light chain: 49.6 mg/L — ABNORMAL HIGH (ref 3.3–19.4)
LAMDA FREE LIGHT CHAINS: 18.1 mg/L (ref 5.7–26.3)

## 2015-11-10 ENCOUNTER — Other Ambulatory Visit: Payer: Self-pay | Admitting: *Deleted

## 2015-11-10 ENCOUNTER — Inpatient Hospital Stay (HOSPITAL_BASED_OUTPATIENT_CLINIC_OR_DEPARTMENT_OTHER): Payer: Medicaid Other | Admitting: Internal Medicine

## 2015-11-10 ENCOUNTER — Other Ambulatory Visit: Payer: Medicaid Other

## 2015-11-10 ENCOUNTER — Encounter: Payer: Self-pay | Admitting: Internal Medicine

## 2015-11-10 VITALS — BP 102/71 | HR 93 | Temp 96.4°F | Resp 18 | Ht 72.0 in | Wt 128.2 lb

## 2015-11-10 DIAGNOSIS — I509 Heart failure, unspecified: Secondary | ICD-10-CM

## 2015-11-10 DIAGNOSIS — R5383 Other fatigue: Secondary | ICD-10-CM

## 2015-11-10 DIAGNOSIS — E785 Hyperlipidemia, unspecified: Secondary | ICD-10-CM

## 2015-11-10 DIAGNOSIS — F419 Anxiety disorder, unspecified: Secondary | ICD-10-CM

## 2015-11-10 DIAGNOSIS — J449 Chronic obstructive pulmonary disease, unspecified: Secondary | ICD-10-CM

## 2015-11-10 DIAGNOSIS — R918 Other nonspecific abnormal finding of lung field: Secondary | ICD-10-CM

## 2015-11-10 DIAGNOSIS — Z7982 Long term (current) use of aspirin: Secondary | ICD-10-CM

## 2015-11-10 DIAGNOSIS — C9002 Multiple myeloma in relapse: Secondary | ICD-10-CM | POA: Diagnosis not present

## 2015-11-10 DIAGNOSIS — Z7951 Long term (current) use of inhaled steroids: Secondary | ICD-10-CM

## 2015-11-10 DIAGNOSIS — K219 Gastro-esophageal reflux disease without esophagitis: Secondary | ICD-10-CM

## 2015-11-10 DIAGNOSIS — I2510 Atherosclerotic heart disease of native coronary artery without angina pectoris: Secondary | ICD-10-CM

## 2015-11-10 DIAGNOSIS — F17210 Nicotine dependence, cigarettes, uncomplicated: Secondary | ICD-10-CM

## 2015-11-10 DIAGNOSIS — R05 Cough: Secondary | ICD-10-CM

## 2015-11-10 DIAGNOSIS — Q211 Atrial septal defect: Secondary | ICD-10-CM

## 2015-11-10 DIAGNOSIS — I252 Old myocardial infarction: Secondary | ICD-10-CM

## 2015-11-10 DIAGNOSIS — R200 Anesthesia of skin: Secondary | ICD-10-CM

## 2015-11-10 DIAGNOSIS — C9000 Multiple myeloma not having achieved remission: Secondary | ICD-10-CM

## 2015-11-10 DIAGNOSIS — R202 Paresthesia of skin: Secondary | ICD-10-CM

## 2015-11-10 DIAGNOSIS — G8929 Other chronic pain: Secondary | ICD-10-CM | POA: Diagnosis not present

## 2015-11-10 DIAGNOSIS — Z79899 Other long term (current) drug therapy: Secondary | ICD-10-CM

## 2015-11-10 LAB — MULTIPLE MYELOMA PANEL, SERUM
ALBUMIN/GLOB SERPL: 1.3 (ref 0.7–1.7)
ALPHA2 GLOB SERPL ELPH-MCNC: 0.7 g/dL (ref 0.4–1.0)
Albumin SerPl Elph-Mcnc: 3.5 g/dL (ref 2.9–4.4)
Alpha 1: 0.3 g/dL (ref 0.0–0.4)
B-Globulin SerPl Elph-Mcnc: 0.8 g/dL (ref 0.7–1.3)
Gamma Glob SerPl Elph-Mcnc: 0.9 g/dL (ref 0.4–1.8)
Globulin, Total: 2.7 g/dL (ref 2.2–3.9)
IGA: 131 mg/dL (ref 90–386)
IGG (IMMUNOGLOBIN G), SERUM: 913 mg/dL (ref 700–1600)
IGM, SERUM: 37 mg/dL (ref 20–172)
M Protein SerPl Elph-Mcnc: 0.3 g/dL — ABNORMAL HIGH
TOTAL PROTEIN ELP: 6.2 g/dL (ref 6.0–8.5)

## 2015-11-10 MED ORDER — GABAPENTIN 300 MG PO CAPS: 300.0000 mg | ORAL_CAPSULE | Freq: Three times a day (TID) | ORAL | 6 refills | Status: DC

## 2015-11-10 MED ORDER — HYDROMORPHONE HCL 2 MG PO TABS: 2.0000 mg | ORAL_TABLET | ORAL | 0 refills | Status: DC | PRN

## 2015-11-10 MED ORDER — ALPRAZOLAM 0.5 MG PO TABS: 0.5000 mg | ORAL_TABLET | Freq: Three times a day (TID) | ORAL | 0 refills | Status: DC | PRN

## 2015-11-10 NOTE — Assessment & Plan Note (Signed)
#  MULTIPLE MYELOMA- status post induction Velcade Revlimid and dexamethasone with a very good partial response. Patient is currently on Revlimid 25 mg 3 weeks on and one-week off.  Most recent  M protein AUG  2017- 0.3 M spike;  Light chain ratio 3  # For now  I recommend continued Revlimid 25 mg 3 weeks on 1 week off.   # chronic pain/anxiety- on fentanyl [wants 100 mcg; rather than 137.23mg] and Dilaudid; Xanax.   # PN-2 -100 TID; not helping; recommend-   #  ? ONJ- Left jaw- DISCONTINUE Zometa. [[[phone]] did not follow up   # follow up in 2 months/ cbc/cmp/m-panel labs.

## 2015-11-10 NOTE — Progress Notes (Signed)
[facility] OFFICE PROGRESS NOTE  Patient Care Team: Petra Kuba, MD as PCP - General (Family Medicine)   SUMMARY OF ONCOLOGIC HISTORY:  Oncology History   # SEP 2015- MULTIPLE MYELOMA [multiple PET pos Bone lesions; hypercalcemia s/p BMBx; FISH- Aneuploidy - gain of chromosome 7,9,15,FGFR3/4p16.3, and CCND1/11q13. Loss of MAF/16q23.1), cytogenetics normal 46XY] s/p Vel-Dex-Rev-Zometa; Excellent PR;   # MARCH 2016- REV-DEX ; FEB 2017-[discn Dex] Rev 25 mg 3 w On & 1 w Off; March 2017- M- protein 0.4gm/dl; K/L= 7.9; cont Rev  # March 2017-  Left Middle Lobe cavitary lesion- ~67m- repeat Ct in 3-447m# AUG 2017- ONJ [[name], [place]]- DISCONT Zometa.   # Chronic pain/Anxiety [ortho]  # COPD/smoking/ [[facility]- not candidate for BMT- sec to co-morbidities/poor nutritional status];      [facility]      INTERVAL HISTORY:  [age]ear old male patient with above history of multiple myeloma on maintenance Revlimid Is here for follow-up.  Patient is anxious because of his recent death of his girlfriend. He states that he has been trying to cut down the dose of fentanyl; is currently on 100 g of fentanyl [instead of the 137 g]; also on Dilaudid and Xanax. He continues to complain of significant tingling and numbness of his extremities. He is currently on Neurontin 100 mg a times a day.  Steady weight. No high-grade fevers.   Complains of fatigue. Denies any worsening shortness of breath or cough. Continues to have chronic cough. Unfortunately continues to smoke.    REVIEW OF SYSTEMS:  A complete 10 point review of system is done which is negative except mentioned above/history of present illness.   PAST MEDICAL HISTORY :  Past Medical History:  Diagnosis Date  . Anxiety   . Atrial septal defect   . CH[facility] (congestive heart failure) ([facility]  . COPD (chronic obstructive pulmonary disease) ([facility] [place]  . Coronary artery disease   . Dysrhythmias   .  GERD (gastroesophageal reflux disease)   . Hyperlipidemia   . Multiple myeloma ([facility]  . Multiple [place]oma ([facility] [place]  . Myocardial infarction   . Substance abuse     PAST SURGICAL HISTORY :  History reviewed. No pertinent surgical history.  FAMILY HISTORY :   Family History  Problem Relation Age of Onset  . COPD Mother   . CAD Mother   . Heart attack Father     SOCIAL HISTORY:   Social History  Substance Use Topics  . Smoking status: Current Every Day Smoker    Packs/day: 0.50    Years: 40.00    Types: Cigarettes  . Smokeless tobacco: Never Used  . Alcohol use No    ALLERGIES:  has No Known Allergies.  MEDICATIONS:  Current Outpatient Prescriptions  Medication Sig Dispense Refill  . albuterol (PROVENTIL HFA;VENTOLIN HFA) 108 (90 BASE) MCG/ACT inhaler Inhale 2 puffs into the lungs every 6 (six) hours as needed for wheezing or shortness of breath.    . ALPRAZolam (XANAX) 0.5 MG tablet Take 1 tablet (0.5 mg total) by mouth 3 (three) times daily as needed for anxiety. 90 tablet 0  . aspirin 325 MG EC tablet Take 325 mg by mouth daily.    . carvedilol (COREG) 6.25 MG tablet Take 6.25 mg by mouth 2 (two) times daily.     . chlorhexidine (PERIDEX) 0.12 % solution SWISS AND EXPECTORATE 10 MLS BY MOUTH TWICE DAILY  0  . docusate sodium (COLACE) 100  MG capsule Take 100 mg by mouth 2 (two) times daily. Reported on 04/07/2015    . fentaNYL (DURAGESIC - DOSED MCG/HR) 100 MCG/HR Place 1 patch (100 mcg total) onto the skin every other day. Pt uses in addition to a 31mg and 239m patch. 15 patch 0  . fentaNYL (DURAGESIC - DOSED MCG/HR) 12 MCG/HR Place 1 patch (12.5 mcg total) onto the skin every other day. Pt uses in addition to a 2535mand 100m65match. 15 patch 0  . fentaNYL (DURAGESIC - DOSED MCG/HR) 25 MCG/HR patch Place 1 patch (25 mcg total) onto the skin every other day. Pt uses in addition to a 12mc14md 100mcg40mch. 15 patch 0  . Fluticasone-Salmeterol (ADVAIR) 100-50 MCG/DOSE AEPB  Inhale 1 puff into the lungs 2 (two) times daily.    . gabaMarland Kitchenentin (NEURONTIN) 100 MG capsule TAKE TWO CAPSULES BY MOUTH THREE TIMES DAILY 180 capsule 2  . HYDROcodone-acetaminophen (NORCO/VICODIN) 5-325 MG tablet TAKE ONE TABLET BY MOUTH EVERY 4 TO 6 HOURS AS NEEDED FOR PAIN  0  . HYDROmorphone (DILAUDID) 2 MG tablet Take 1 tablet (2 mg total) by mouth every 4 (four) hours as needed for severe pain. 120 tablet 0  . ibuprofen (ADVIL,MOTRIN) 600 MG tablet Take 600 mg by mouth every 6 (six) hours as needed. for pain  0  . ipratropium-albuterol (DUONEB) 0.5-2.5 (3) MG/3ML SOLN Take 3 mLs by nebulization every 4 (four) hours as needed. 360 mL 3  . lenalidomide (REVLIMID) 25 MG capsule Take 1 capsule (25 mg total) by mouth daily. For 21 days then off 7 days 21 capsule 0  . polyethylene glycol (MIRALAX / GLYCOLAX) packet Take 17 g by mouth daily. Reported on 04/07/2015    . promethazine (PHENERGAN) 25 MG tablet Take 1 tablet (25 mg total) by mouth every 4 (four) hours as needed for nausea or vomiting. 60 tablet 2  . tiotropium (SPIRIVA) 18 MCG inhalation capsule Place 1 capsule (18 mcg total) into inhaler and inhale daily. 30 capsule 12   No current facility-administered medications for this visit.    Facility-Administered Medications Ordered in Other Visits  Medication Dose Route Frequency Provider Last Rate Last Dose  . 0.9 %  sodium chloride infusion   Intravenous Continuous GovindCammie Sickle0 mL/hr at 02/24/15 1520      PHYSICAL EXAMINATION: ECOG PERFORMANCE STATUS: 2 - Symptomatic, <50% confined to bed  BP 102/71 (BP Location: Left Arm, Patient Position: Sitting)   Pulse 93   Temp (!) 96.4 F (35.8 C) (Tympanic)   Resp 18   Ht 6' (1.829 m)   Wt 128 lb 3.2 oz (58.2 kg)   BMI 17.39 kg/m   Filed Weights   11/10/15 1423  Weight: 128 lb 3.2 oz (58.2 kg)    GENERAL: Thin build cachectic-appearing Caucasian male patient. Alert, in  Mild- nod pain; .  Accompanied by family;he is  walking by himself EYES: no pallor or icterus OROPHARYNX: Jaw bone exposed left lower jaw poor dentition. NECK: supple, no masses felt LYMPH:  no palpable lymphadenopathy in the cervical, axillary or inguinal regions LUNGS: With decreased bilateral breath sounds. No wheeze or crackles HEART/CVS: regular rate & rhythm and no murmurs;  No lower extremity edema. ABDOMEN:abdomen soft, non-tender and normal bowel sounds Musculoskeletal:no cyanosis of digits and no clubbing  PSYCH: alert & oriented x 3 with fluent speech NEURO: no focal motor/sensory deficits SKIN:  no rashes or significant lesions  LABORATORY DATA:  I have reviewed the data  as listed    Component Value Date/Time   NA 139 11/08/2015 1350   NA 135 05/12/2014 1343   K 3.6 11/08/2015 1350   K 3.3 (L) 05/12/2014 1343   CL 102 11/08/2015 1350   CL 101 05/12/2014 1343   CO2 29 11/08/2015 1350   CO2 27 05/12/2014 1343   GLUCOSE 100 (H) 11/08/2015 1350   GLUCOSE 77 05/12/2014 1343   BUN 8 11/08/2015 1350   BUN 9 05/12/2014 1343   CREATININE 0.83 11/08/2015 1350   CREATININE 0.89 05/12/2014 1343   CALCIUM 8.6 (L) 11/08/2015 1350   CALCIUM 8.8 (L) 05/12/2014 1343   PROT 6.6 11/08/2015 1350   PROT 5.9 (L) 02/17/2014 1405   ALBUMIN 3.7 11/08/2015 1350   ALBUMIN 2.8 (L) 02/17/2014 1405   AST 13 (L) 11/08/2015 1350   AST 9 (L) 02/17/2014 1405   ALT 7 (L) 11/08/2015 1350   ALT 12 (L) 02/17/2014 1405   ALKPHOS 37 (L) 11/08/2015 1350   ALKPHOS 44 (L) 02/17/2014 1405   BILITOT 0.5 11/08/2015 1350   BILITOT 0.2 02/17/2014 1405   GFRNONAA >60 11/08/2015 1350   GFRNONAA >60 05/12/2014 1343   GFRAA >60 11/08/2015 1350   GFRAA >60 05/12/2014 1343    No results found for: SPEP, UPEP  Lab Results  Component Value Date   WBC 4.0 11/08/2015   NEUTROABS 2.2 11/08/2015   HGB 12.6 (L) 11/08/2015   HCT 36.3 (L) 11/08/2015   MCV 96.1 11/08/2015   PLT 221 11/08/2015      Chemistry      Component Value Date/Time   NA 139  11/08/2015 1350   NA 135 05/12/2014 1343   K 3.6 11/08/2015 1350   K 3.3 (L) 05/12/2014 1343   CL 102 11/08/2015 1350   CL 101 05/12/2014 1343   CO2 29 11/08/2015 1350   CO2 27 05/12/2014 1343   BUN 8 11/08/2015 1350   BUN 9 05/12/2014 1343   CREATININE 0.83 11/08/2015 1350   CREATININE 0.89 05/12/2014 1343      Component Value Date/Time   CALCIUM 8.6 (L) 11/08/2015 1350   CALCIUM 8.8 (L) 05/12/2014 1343   ALKPHOS 37 (L) 11/08/2015 1350   ALKPHOS 44 (L) 02/17/2014 1405   AST 13 (L) 11/08/2015 1350   AST 9 (L) 02/17/2014 1405   ALT 7 (L) 11/08/2015 1350   ALT 12 (L) 02/17/2014 1405   BILITOT 0.5 11/08/2015 1350   BILITOT 0.2 02/17/2014 1405       IgG (Immunoglobin G), Serum 700 - 1,600 mg/dL 928  958  791  781     IgA 90 - 386 mg/dL 124  105  73   72     IgM, Serum 20 - 172 mg/dL 46  24CM  23CM  24CM    Total Protein ELP 6.0 - 8.5 g/dL 6.4VC  6.0VC  5.4VC   5.6     Albumin SerPl Elph-Mcnc 2.9 - 4.4 g/dL 3.5VC  3.3VC  2.9VC     Alpha 1 0.0 - 0.4 g/dL 0.4VC  0.3VC  0.3VC     Alpha2 Glob SerPl Elph-Mcnc 0.4 - 1.0 g/dL 0.9VC  0.7VC  0.8VC     B-Globulin SerPl Elph-Mcnc 0.7 - 1.3 g/dL 1.0VC  0.7VC  0.7VC     Gamma Glob SerPl Elph-Mcnc 0.4 - 1.8 g/dL 0.7VC  1.0VC  0.8VC     M Protein SerPl Elph-Mcnc Not Observed g/dL 0.3VC   0.5VC   0.4VC  Globulin, Total 2.2 - 3.9 g/dL 2.9VC  2.7VC  2.5VC     Albumin/Glob SerPl 0.7 - 1.7 1.3VC  1.3VC  1.2VC     IFE 1  CommentVC  CommentVC, CM  CommentVC, CM    Comments: (NOTE)  Immunofixation shows IgG monoclonal protein with kappa light chain  specificity.    Please Note  CommentVC  CommentVC, CM  CommentVC, CM    Comments: (NOTE)  Protein electrophoresis scan will follow via computer, mail, or  courier delivery.  Performed At: [facility]  [place], [place] [id number]  Lindon Romp MD PJ:[id number]   Resulting Agency  Signa Kell [id number]    Specimen Collected: 09/09/15 13:33 Last  Resulted: 09/14/15 13:36            VC=Value has a corrected status CM=Additional comments        Other Results from 09/09/2015     Kappa/lambda light chains  Order: [id number]   Status:  Final result Visible to patient:  No (Not Released) Next appt:  None Dx:  Multiple myeloma in relapse ([facility])  Newer results are available. Click to view them now.    Ref Range & Units 83moago 571mogo 22m84moo 29m95mo   Kappa free light chain 3.3 - 19.4 mg/L 67.4   80.65R, CM   81.44R   79.18R     Lamda free light chains 5.7 - 26.3 mg/L 22.2  14.36R, CM  10.24R  7.86R    Kappa, lamda light chain ratio 0.26 - 1.65 3.04   5.62CM   7.95CM   10.07CM    Comments: (NOTE)  Performed At: [facility] [id number] RompPh:8OV:[id number] Agency  SUNQSigna KellQOKHTXHFSSpecimen Collected: 09/09/15 13:33 Last Resulted: 09/10/15 14:36            CM=Additional commentsR=Reference range differs from displayed range           ASSESSMENT & PLAN:   No problem-specific Assessment & Plan notes found for this encounter.     GoviCammie Sickle 11/10/2015 2:36 PM

## 2015-11-10 NOTE — Progress Notes (Signed)
Pt reports having numbness in fingers and feet.  Lower left hip pain and back pain

## 2015-11-23 ENCOUNTER — Other Ambulatory Visit: Payer: Self-pay | Admitting: *Deleted

## 2015-11-23 DIAGNOSIS — C9000 Multiple myeloma not having achieved remission: Secondary | ICD-10-CM

## 2015-11-23 DIAGNOSIS — F419 Anxiety disorder, unspecified: Secondary | ICD-10-CM

## 2015-11-23 DIAGNOSIS — J449 Chronic obstructive pulmonary disease, unspecified: Secondary | ICD-10-CM

## 2015-11-23 DIAGNOSIS — J4489 Other specified chronic obstructive pulmonary disease: Secondary | ICD-10-CM

## 2015-11-23 NOTE — Telephone Encounter (Signed)
Acknowledge by MD. Will print out on 11/1 for patient for md to sign for patient since md is in [facility] today.

## 2015-11-23 NOTE — Telephone Encounter (Signed)
Called to report that the change in his gabapentin has not helped his PN and is asking to move his appt up, he was transferred to scheduling to change appt. Per your last office note, he was cutting down on his fentanyl to 100 mcg. Is this the dose we should refill

## 2015-11-24 MED ORDER — FENTANYL 100 MCG/HR TD PT72: 100.0000 ug | MEDICATED_PATCH | TRANSDERMAL | 0 refills | Status: DC

## 2015-11-29 ENCOUNTER — Inpatient Hospital Stay: Payer: Medicaid Other | Attending: Internal Medicine

## 2015-11-29 ENCOUNTER — Inpatient Hospital Stay (HOSPITAL_BASED_OUTPATIENT_CLINIC_OR_DEPARTMENT_OTHER): Payer: Medicaid Other | Admitting: Internal Medicine

## 2015-11-29 VITALS — BP 99/66 | HR 79 | Temp 96.7°F | Resp 20 | Wt 130.4 lb

## 2015-11-29 DIAGNOSIS — F419 Anxiety disorder, unspecified: Secondary | ICD-10-CM | POA: Insufficient documentation

## 2015-11-29 DIAGNOSIS — G8929 Other chronic pain: Secondary | ICD-10-CM | POA: Diagnosis not present

## 2015-11-29 DIAGNOSIS — E785 Hyperlipidemia, unspecified: Secondary | ICD-10-CM | POA: Insufficient documentation

## 2015-11-29 DIAGNOSIS — R200 Anesthesia of skin: Secondary | ICD-10-CM | POA: Insufficient documentation

## 2015-11-29 DIAGNOSIS — C9002 Multiple myeloma in relapse: Secondary | ICD-10-CM

## 2015-11-29 DIAGNOSIS — R202 Paresthesia of skin: Secondary | ICD-10-CM

## 2015-11-29 DIAGNOSIS — I2510 Atherosclerotic heart disease of native coronary artery without angina pectoris: Secondary | ICD-10-CM | POA: Insufficient documentation

## 2015-11-29 DIAGNOSIS — J449 Chronic obstructive pulmonary disease, unspecified: Secondary | ICD-10-CM

## 2015-11-29 DIAGNOSIS — Z7982 Long term (current) use of aspirin: Secondary | ICD-10-CM | POA: Diagnosis not present

## 2015-11-29 DIAGNOSIS — F17210 Nicotine dependence, cigarettes, uncomplicated: Secondary | ICD-10-CM | POA: Diagnosis not present

## 2015-11-29 DIAGNOSIS — Z79899 Other long term (current) drug therapy: Secondary | ICD-10-CM

## 2015-11-29 DIAGNOSIS — I252 Old myocardial infarction: Secondary | ICD-10-CM | POA: Insufficient documentation

## 2015-11-29 DIAGNOSIS — I509 Heart failure, unspecified: Secondary | ICD-10-CM | POA: Insufficient documentation

## 2015-11-29 DIAGNOSIS — R5383 Other fatigue: Secondary | ICD-10-CM

## 2015-11-29 DIAGNOSIS — K219 Gastro-esophageal reflux disease without esophagitis: Secondary | ICD-10-CM | POA: Insufficient documentation

## 2015-11-29 MED ORDER — DULOXETINE HCL 30 MG PO CPEP: 30.0000 mg | ORAL_CAPSULE | Freq: Every day | ORAL | 3 refills | Status: DC

## 2015-11-29 NOTE — Progress Notes (Signed)
[facility] OFFICE PROGRESS NOTE  Patient Care Team: Petra Kuba, MD as PCP - General (Family Medicine)   SUMMARY OF ONCOLOGIC HISTORY:  Oncology History   # SEP 2015- MULTIPLE MYELOMA [multiple PET pos Bone lesions; hypercalcemia s/p BMBx; FISH- Aneuploidy - gain of chromosome 7,9,15,FGFR3/4p16.3, and CCND1/11q13. Loss of MAF/16q23.1), cytogenetics normal 46XY] s/p Vel-Dex-Rev-Zometa; Excellent PR;   # MARCH 2016- REV-DEX ; FEB 2017-[discn Dex] Rev 25 mg 3 w On & 1 w Off; March 2017- M- protein 0.4gm/dl; K/L= 7.9; cont Rev  # March 2017-  Left Middle Lobe cavitary lesion- ~26m- repeat Ct in 3-435m# AUG 2017- ONJ [[name], [place]]- DISCONT Zometa.   # Chronic pain/Anxiety [ortho]  # COPD/smoking/ [[facility]- not candidate for BMT- sec to co-morbidities/poor nutritional status];      [facility]      INTERVAL HISTORY:  [age]ear old male patient with above history of multiple myeloma on maintenance Revlimid Is here for follow-up.  Patient complains of increasing tingling and numbness of his bilateral feet since going up on the dose of Neurontin to 300 3 times a day. Denies any skin rash denies any diarrhea.  Steady weight. No high-grade fevers.   Complains of fatigue. Denies any worsening shortness of breath or cough. Continues to have chronic cough. Unfortunately continues to smoke.    REVIEW OF SYSTEMS:  A complete 10 point review of system is done which is negative except mentioned above/history of present illness.   PAST MEDICAL HISTORY :  Past Medical History:  Diagnosis Date  . Anxiety   . Atrial septal defect   . CH[facility] (congestive [facility] failure) ([facility]  . COPD (chronic obstructive pulmonary disease) ([facility]  . Coronary artery disease   . Dysrhythmias   . GERD (gastroesophageal reflux disease)   . Hyperlipidemia   . Multiple myeloma ([facility]  . Multiple myeloma ([facility] [place]  . Myocardial infarction   . Substance abuse     PAST  SURGICAL HISTORY :  No past surgical history on file.  FAMILY HISTORY :   Family History  Problem Relation Age of Onset  . COPD Mother   . CAD Mother   . Heart attack Father     SOCIAL HISTORY:   Social History  Substance Use Topics  . Smoking status: Current Every Day Smoker    Packs/day: 0.50    Years: 40.00    Types: Cigarettes  . Smokeless tobacco: Never Used  . Alcohol use No    ALLERGIES:  has No Known Allergies.  MEDICATIONS:  Current Outpatient Prescriptions  Medication Sig Dispense Refill  . albuterol (PROVENTIL HFA;VENTOLIN HFA) 108 (90 BASE) MCG/ACT inhaler Inhale 2 puffs into the lungs every 6 (six) hours as needed for wheezing or shortness of breath.    . ALPRAZolam (XANAX) 0.5 MG tablet Take 1 tablet (0.5 mg total) by mouth 3 (three) times daily as needed for anxiety. 90 tablet 0  . aspirin 325 MG EC tablet Take 325 mg by mouth daily.    . carvedilol (COREG) 6.25 MG tablet Take 6.25 mg by mouth 2 (two) times daily.     . Marland Kitchenocusate sodium (COLACE) 100 MG capsule Take 100 mg by mouth 2 (two) times daily. Reported on 04/07/2015    . fentaNYL (DURAGESIC - DOSED MCG/HR) 100 MCG/HR Place 1 patch (100 mcg total) onto the skin every other day. 15 patch 0  . Fluticasone-Salmeterol (ADVAIR) 100-50 MCG/DOSE AEPB Inhale 1 puff into  the lungs 2 (two) times daily.    Marland Kitchen gabapentin (NEURONTIN) 300 MG capsule Take 1 capsule (300 mg total) by mouth 3 (three) times daily. (Patient taking differently: Take 200 mg by mouth 3 (three) times daily. ) 90 capsule 6  . HYDROcodone-acetaminophen (NORCO/VICODIN) 5-325 MG tablet TAKE ONE TABLET BY MOUTH EVERY 4 TO 6 HOURS AS NEEDED FOR PAIN  0  . HYDROmorphone (DILAUDID) 2 MG tablet Take 1 tablet (2 mg total) by mouth every 4 (four) hours as needed for severe pain. 120 tablet 0  . ipratropium-albuterol (DUONEB) 0.5-2.5 (3) MG/3ML SOLN Take 3 mLs by nebulization every 4 (four) hours as needed. 360 mL 3  . lenalidomide (REVLIMID) 25 MG capsule Take  1 capsule (25 mg total) by mouth daily. For 21 days then off 7 days 21 capsule 0  . promethazine (PHENERGAN) 25 MG tablet Take 1 tablet (25 mg total) by mouth every 4 (four) hours as needed for nausea or vomiting. 60 tablet 2  . tiotropium (SPIRIVA) 18 MCG inhalation capsule Place 1 capsule (18 mcg total) into inhaler and inhale daily. 30 capsule 12  . chlorhexidine (PERIDEX) 0.12 % solution SWISS AND EXPECTORATE 10 MLS BY MOUTH TWICE DAILY  0  . DULoxetine (CYMBALTA) 30 MG capsule Take 1 capsule (30 mg total) by mouth daily. 30 capsule 3  . ibuprofen (ADVIL,MOTRIN) 600 MG tablet Take 600 mg by mouth every 6 (six) hours as needed. for pain  0  . polyethylene glycol (MIRALAX / GLYCOLAX) packet Take 17 g by mouth daily. Reported on 04/07/2015     No current facility-administered medications for this visit.    Facility-Administered Medications Ordered in Other Visits  Medication Dose Route Frequency Provider Last Rate Last Dose  . 0.9 %  sodium chloride infusion   Intravenous Continuous Cammie Sickle, MD 10 mL/hr at 02/24/15 1520      PHYSICAL EXAMINATION: ECOG PERFORMANCE STATUS: 2 - Symptomatic, <50% confined to bed  BP 99/66 (BP Location: Left Arm, Patient Position: Sitting)   Pulse 79   Temp (!) 96.7 F (35.9 C) (Tympanic)   Resp 20   Wt 130 lb 6.4 oz (59.1 kg)   SpO2 94%   BMI 17.69 kg/m   Filed Weights   11/29/15 1536  Weight: 130 lb 6.4 oz (59.1 kg)    GENERAL: Thin build cachectic-appearing Caucasian male patient. Alert, in  Mild- nod pain; .  Accompanied by Mother. he is walking by himself EYES: no pallor or icterus OROPHARYNX: Jaw bone exposed left lower jaw poor dentition. NECK: supple, no masses felt LYMPH:  no palpable lymphadenopathy in the cervical, axillary or inguinal regions LUNGS: With decreased bilateral breath sounds. No wheeze or crackles HEART/CVS: regular rate & rhythm and no murmurs;  No lower extremity edema. ABDOMEN:abdomen soft, non-tender and  normal bowel sounds Musculoskeletal:no cyanosis of digits and no clubbing  PSYCH: alert & oriented x 3 with fluent speech NEURO: no focal motor/sensory deficits SKIN:  no rashes or significant lesions  LABORATORY DATA:  I have reviewed the data as listed    Component Value Date/Time   NA 139 11/08/2015 1350   NA 135 05/12/2014 1343   K 3.6 11/08/2015 1350   K 3.3 (L) 05/12/2014 1343   CL 102 11/08/2015 1350   CL 101 05/12/2014 1343   CO2 29 11/08/2015 1350   CO2 27 05/12/2014 1343   GLUCOSE 100 (H) 11/08/2015 1350   GLUCOSE 77 05/12/2014 1343   BUN 8 11/08/2015 1350  BUN 9 05/12/2014 1343   CREATININE 0.83 11/08/2015 1350   CREATININE 0.89 05/12/2014 1343   CALCIUM 8.6 (L) 11/08/2015 1350   CALCIUM 8.8 (L) 05/12/2014 1343   PROT 6.6 11/08/2015 1350   PROT 5.9 (L) 02/17/2014 1405   ALBUMIN 3.7 11/08/2015 1350   ALBUMIN 2.8 (L) 02/17/2014 1405   AST 13 (L) 11/08/2015 1350   AST 9 (L) 02/17/2014 1405   ALT 7 (L) 11/08/2015 1350   ALT 12 (L) 02/17/2014 1405   ALKPHOS 37 (L) 11/08/2015 1350   ALKPHOS 44 (L) 02/17/2014 1405   BILITOT 0.5 11/08/2015 1350   BILITOT 0.2 02/17/2014 1405   GFRNONAA >60 11/08/2015 1350   GFRNONAA >60 05/12/2014 1343   GFRAA >60 11/08/2015 1350   GFRAA >60 05/12/2014 1343    No results found for: SPEP, UPEP  Lab Results  Component Value Date   WBC 4.0 11/08/2015   NEUTROABS 2.2 11/08/2015   HGB 12.6 (L) 11/08/2015   HCT 36.3 (L) 11/08/2015   MCV 96.1 11/08/2015   PLT 221 11/08/2015      Chemistry      Component Value Date/Time   NA 139 11/08/2015 1350   NA 135 05/12/2014 1343   K 3.6 11/08/2015 1350   K 3.3 (L) 05/12/2014 1343   CL 102 11/08/2015 1350   CL 101 05/12/2014 1343   CO2 29 11/08/2015 1350   CO2 27 05/12/2014 1343   BUN 8 11/08/2015 1350   BUN 9 05/12/2014 1343   CREATININE 0.83 11/08/2015 1350   CREATININE 0.89 05/12/2014 1343      Component Value Date/Time   CALCIUM 8.6 (L) 11/08/2015 1350   CALCIUM 8.8 (L)  05/12/2014 1343   ALKPHOS 37 (L) 11/08/2015 1350   ALKPHOS 44 (L) 02/17/2014 1405   AST 13 (L) 11/08/2015 1350   AST 9 (L) 02/17/2014 1405   ALT 7 (L) 11/08/2015 1350   ALT 12 (L) 02/17/2014 1405   BILITOT 0.5 11/08/2015 1350   BILITOT 0.2 02/17/2014 1405      Results for KWAMAINE, CUPPETT A (MRN [id number]) as of 11/29/2015 16:11  Ref. Range 07/13/2015 12:38 07/14/2015 14:00 09/02/2015 13:15 09/09/2015 13:33 11/08/2015 13:50  M Protein SerPl Elph-Mcnc Latest Ref Range: Not Observed g/dL    0.3 (H) 0.3 (H)  Results for OCIEL, RETHERFORD (MRN [id number]) as of 11/29/2015 16:11  Ref. Range 01/06/2015 09:43 04/05/2015 09:09 06/02/2015 13:20 09/09/2015 13:33 11/08/2015 13:50  Kappa, lamda light chain ratio Latest Ref Range: 0.26 - 1.65  10.07 (H) 7.95 (H) 5.62 (H) 3.04 (H) 2.74 (H)       ASSESSMENT & PLAN:   Multiple myeloma in relapse ([place]) # MULTIPLE MYELOMA- status post induction Velcade Revlimid and dexamethasone with a very good partial response. Patient is currently on Revlimid 25 mg 3 weeks on and one-week off.  No concerns for progression at this time.   # chronic pain/anxiety- on fentanyl [137.74mg] and Dilaudid; Xanax.   # PN-2 -300 TID; made worse; recommend Cymbalta 344m A day; if not improved recommend going up to 30 BID; and also referral to Neurology.   #  ? ONJ- Left jaw- DISCONTINUE Zometa. [D[[id number]]did not follow up   # follow up in 2 months/ cbc/cmp/m-panel labs.      GoCammie SickleMD 11/30/2015 5:53 PM

## 2015-11-29 NOTE — Assessment & Plan Note (Addendum)
#  MULTIPLE MYELOMA- status post induction Velcade Revlimid and dexamethasone with a very good partial response. Patient is currently on Revlimid 25 mg 3 weeks on and one-week off.  No concerns for progression at this time.   # chronic pain/anxiety- on fentanyl [137.33mg] and Dilaudid; Xanax.   # PN-2 -300 TID; made worse; recommend Cymbalta 334m A day; if not improved recommend going up to 30 BID; and also referral to Neurology.   #  ? ONJ- Left jaw- DISCONTINUE Zometa. [D[[id number]]did not follow up   # follow up in 2 months/ cbc/cmp/m-panel labs.

## 2015-11-29 NOTE — Progress Notes (Signed)
Patient is here for follow up, he mentions SOB this morning, he mentions his feet are tingling, he decreased his gabapentin.

## 2015-12-13 ENCOUNTER — Other Ambulatory Visit: Payer: Self-pay | Admitting: *Deleted

## 2015-12-13 DIAGNOSIS — F419 Anxiety disorder, unspecified: Secondary | ICD-10-CM

## 2015-12-13 DIAGNOSIS — C9000 Multiple myeloma not having achieved remission: Secondary | ICD-10-CM

## 2015-12-13 MED ORDER — ALPRAZOLAM 0.5 MG PO TABS: 0.5000 mg | ORAL_TABLET | Freq: Three times a day (TID) | ORAL | 0 refills | Status: DC | PRN

## 2015-12-13 MED ORDER — HYDROMORPHONE HCL 2 MG PO TABS: 2.0000 mg | ORAL_TABLET | ORAL | 0 refills | Status: DC | PRN

## 2015-12-20 ENCOUNTER — Other Ambulatory Visit: Payer: Self-pay | Admitting: Internal Medicine

## 2015-12-20 ENCOUNTER — Other Ambulatory Visit: Payer: Self-pay | Admitting: *Deleted

## 2015-12-20 DIAGNOSIS — J449 Chronic obstructive pulmonary disease, unspecified: Secondary | ICD-10-CM

## 2015-12-20 DIAGNOSIS — F419 Anxiety disorder, unspecified: Secondary | ICD-10-CM

## 2015-12-20 DIAGNOSIS — C9000 Multiple myeloma not having achieved remission: Secondary | ICD-10-CM

## 2015-12-20 DIAGNOSIS — J4489 Other specified chronic obstructive pulmonary disease: Secondary | ICD-10-CM

## 2015-12-23 MED ORDER — FENTANYL 100 MCG/HR TD PT72: 100.0000 ug | MEDICATED_PATCH | TRANSDERMAL | 0 refills | Status: DC

## 2015-12-23 MED ORDER — DULOXETINE HCL 30 MG PO CPEP: 30.0000 mg | ORAL_CAPSULE | Freq: Every day | ORAL | 3 refills | Status: DC

## 2016-01-11 ENCOUNTER — Telehealth: Payer: Self-pay | Admitting: *Deleted

## 2016-01-11 NOTE — Telephone Encounter (Signed)
Opened in error

## 2016-01-12 ENCOUNTER — Ambulatory Visit: Payer: Medicaid Other | Admitting: Internal Medicine

## 2016-01-12 ENCOUNTER — Telehealth: Payer: Self-pay | Admitting: *Deleted

## 2016-01-12 ENCOUNTER — Other Ambulatory Visit: Payer: Medicaid Other

## 2016-01-12 ENCOUNTER — Other Ambulatory Visit: Payer: Self-pay | Admitting: *Deleted

## 2016-01-12 DIAGNOSIS — F419 Anxiety disorder, unspecified: Secondary | ICD-10-CM

## 2016-01-12 DIAGNOSIS — C9000 Multiple myeloma not having achieved remission: Secondary | ICD-10-CM

## 2016-01-12 MED ORDER — PROMETHAZINE HCL 25 MG PO TABS: 25.0000 mg | ORAL_TABLET | ORAL | 0 refills | Status: DC | PRN

## 2016-01-12 MED ORDER — ALPRAZOLAM 0.5 MG PO TABS: 0.5000 mg | ORAL_TABLET | Freq: Three times a day (TID) | ORAL | 0 refills | Status: DC | PRN

## 2016-01-12 MED ORDER — HYDROMORPHONE HCL 2 MG PO TABS: 2.0000 mg | ORAL_TABLET | ORAL | 0 refills | Status: DC | PRN

## 2016-01-12 NOTE — Telephone Encounter (Signed)
Informed patient that his prescriptions were at the front desk.

## 2016-01-19 ENCOUNTER — Other Ambulatory Visit: Payer: Self-pay | Admitting: *Deleted

## 2016-01-19 DIAGNOSIS — J449 Chronic obstructive pulmonary disease, unspecified: Secondary | ICD-10-CM

## 2016-01-19 DIAGNOSIS — C9000 Multiple myeloma not having achieved remission: Secondary | ICD-10-CM

## 2016-01-19 DIAGNOSIS — F419 Anxiety disorder, unspecified: Secondary | ICD-10-CM

## 2016-01-19 MED ORDER — FENTANYL 100 MCG/HR TD PT72: 100.0000 ug | MEDICATED_PATCH | TRANSDERMAL | 0 refills | Status: DC

## 2016-01-26 ENCOUNTER — Other Ambulatory Visit: Payer: Self-pay | Admitting: *Deleted

## 2016-01-26 DIAGNOSIS — F419 Anxiety disorder, unspecified: Secondary | ICD-10-CM

## 2016-01-26 DIAGNOSIS — C9000 Multiple myeloma not having achieved remission: Secondary | ICD-10-CM

## 2016-01-26 DIAGNOSIS — J449 Chronic obstructive pulmonary disease, unspecified: Secondary | ICD-10-CM

## 2016-01-26 MED ORDER — IPRATROPIUM-ALBUTEROL 0.5-2.5 (3) MG/3ML IN SOLN: 3.0000 mL | RESPIRATORY_TRACT | 3 refills | Status: DC | PRN

## 2016-01-28 ENCOUNTER — Inpatient Hospital Stay: Payer: Medicaid Other

## 2016-01-28 ENCOUNTER — Telehealth: Payer: Self-pay | Admitting: *Deleted

## 2016-01-28 NOTE — Telephone Encounter (Signed)
prescription request from [facility] for RF on revlimid. Rems auth number: [id number] obtained. Pt contacted. Not sch. To start back on revlmid until next Friday - 02/04/16.  I explained to patient that this was initiated this morning and faxed to [facility]. I asked the patient to take his patient survey. Pt states that he is not having any medical problems or side effects from the revilmid.

## 2016-02-03 ENCOUNTER — Inpatient Hospital Stay: Payer: Medicaid Other | Attending: Internal Medicine

## 2016-02-03 DIAGNOSIS — Q211 Atrial septal defect: Secondary | ICD-10-CM | POA: Diagnosis not present

## 2016-02-03 DIAGNOSIS — F419 Anxiety disorder, unspecified: Secondary | ICD-10-CM | POA: Diagnosis not present

## 2016-02-03 DIAGNOSIS — C9002 Multiple myeloma in relapse: Secondary | ICD-10-CM | POA: Diagnosis not present

## 2016-02-03 DIAGNOSIS — E785 Hyperlipidemia, unspecified: Secondary | ICD-10-CM | POA: Diagnosis not present

## 2016-02-03 DIAGNOSIS — R5383 Other fatigue: Secondary | ICD-10-CM | POA: Diagnosis not present

## 2016-02-03 DIAGNOSIS — I509 Heart failure, unspecified: Secondary | ICD-10-CM | POA: Insufficient documentation

## 2016-02-03 DIAGNOSIS — Z7982 Long term (current) use of aspirin: Secondary | ICD-10-CM | POA: Insufficient documentation

## 2016-02-03 DIAGNOSIS — G8929 Other chronic pain: Secondary | ICD-10-CM | POA: Insufficient documentation

## 2016-02-03 DIAGNOSIS — K219 Gastro-esophageal reflux disease without esophagitis: Secondary | ICD-10-CM | POA: Diagnosis not present

## 2016-02-03 DIAGNOSIS — I252 Old myocardial infarction: Secondary | ICD-10-CM | POA: Insufficient documentation

## 2016-02-03 DIAGNOSIS — F17210 Nicotine dependence, cigarettes, uncomplicated: Secondary | ICD-10-CM | POA: Diagnosis not present

## 2016-02-03 DIAGNOSIS — J449 Chronic obstructive pulmonary disease, unspecified: Secondary | ICD-10-CM | POA: Insufficient documentation

## 2016-02-03 DIAGNOSIS — I2510 Atherosclerotic heart disease of native coronary artery without angina pectoris: Secondary | ICD-10-CM | POA: Insufficient documentation

## 2016-02-03 DIAGNOSIS — C9000 Multiple myeloma not having achieved remission: Secondary | ICD-10-CM

## 2016-02-03 DIAGNOSIS — R911 Solitary pulmonary nodule: Secondary | ICD-10-CM | POA: Insufficient documentation

## 2016-02-03 DIAGNOSIS — Z79899 Other long term (current) drug therapy: Secondary | ICD-10-CM | POA: Insufficient documentation

## 2016-02-03 LAB — COMPREHENSIVE METABOLIC PANEL
ALT: 7 U/L — ABNORMAL LOW (ref 17–63)
AST: 14 U/L — AB (ref 15–41)
Albumin: 4.3 g/dL (ref 3.5–5.0)
Alkaline Phosphatase: 35 U/L — ABNORMAL LOW (ref 38–126)
Anion gap: 4 — ABNORMAL LOW (ref 5–15)
BILIRUBIN TOTAL: 0.6 mg/dL (ref 0.3–1.2)
BUN: 7 mg/dL (ref 6–20)
CO2: 31 mmol/L (ref 22–32)
CREATININE: 0.91 mg/dL (ref 0.61–1.24)
Calcium: 9.2 mg/dL (ref 8.9–10.3)
Chloride: 102 mmol/L (ref 101–111)
GFR calc Af Amer: >60 mL/min (ref >60)
Glucose, Bld: 99 mg/dL (ref 65–99)
POTASSIUM: 4 mmol/L (ref 3.5–5.1)
Sodium: 137 mmol/L (ref 135–145)
TOTAL PROTEIN: 7.2 g/dL (ref 6.5–8.1)

## 2016-02-03 LAB — CBC WITH DIFFERENTIAL/PLATELET
BASOS ABS: 0.1 10*3/uL (ref 0–0.1)
Basophils Relative: 1 %
Eosinophils Absolute: 0.1 10*3/uL (ref 0–0.7)
Eosinophils Relative: 2 %
HEMATOCRIT: 39.1 % — AB (ref 40.0–52.0)
Hemoglobin: 13.3 g/dL (ref 13.0–18.0)
LYMPHS ABS: 1.7 10*3/uL (ref 1.0–3.6)
LYMPHS PCT: 34 %
MCH: 33.4 pg (ref 26.0–34.0)
MCHC: 33.9 g/dL (ref 32.0–36.0)
MCV: 98.5 fL (ref 80.0–100.0)
MONO ABS: 0.3 10*3/uL (ref 0.2–1.0)
MONOS PCT: 6 %
NEUTROS ABS: 2.9 10*3/uL (ref 1.4–6.5)
Neutrophils Relative %: 57 %
Platelets: 271 10*3/uL (ref 150–440)
RBC: 3.98 MIL/uL — ABNORMAL LOW (ref 4.40–5.90)
RDW: 16 % — AB (ref 11.5–14.5)
WBC: 5.1 10*3/uL (ref 3.8–10.6)

## 2016-02-04 ENCOUNTER — Ambulatory Visit: Payer: Medicaid Other | Admitting: Internal Medicine

## 2016-02-04 LAB — KAPPA/LAMBDA LIGHT CHAINS
KAPPA FREE LGHT CHN: 44.5 mg/L — AB (ref 3.3–19.4)
Kappa, lambda light chain ratio: 3.05 — ABNORMAL HIGH (ref 0.26–1.65)
Lambda free light chains: 14.6 mg/L (ref 5.7–26.3)

## 2016-02-07 LAB — MULTIPLE MYELOMA PANEL, SERUM
ALBUMIN/GLOB SERPL: 1.2 (ref 0.7–1.7)
Albumin SerPl Elph-Mcnc: 3.6 g/dL (ref 2.9–4.4)
Alpha 1: 0.3 g/dL (ref 0.0–0.4)
Alpha2 Glob SerPl Elph-Mcnc: 0.7 g/dL (ref 0.4–1.0)
B-GLOBULIN SERPL ELPH-MCNC: 0.9 g/dL (ref 0.7–1.3)
GAMMA GLOB SERPL ELPH-MCNC: 1.1 g/dL (ref 0.4–1.8)
GLOBULIN, TOTAL: 3.1 g/dL (ref 2.2–3.9)
IgA: 152 mg/dL (ref 90–386)
IgG (Immunoglobin G), Serum: 992 mg/dL (ref 700–1600)
IgM, Serum: 47 mg/dL (ref 20–172)
M Protein SerPl Elph-Mcnc: 0.3 g/dL — ABNORMAL HIGH
PDF SPE: 0
Total Protein ELP: 6.7 g/dL (ref 6.0–8.5)

## 2016-02-09 ENCOUNTER — Ambulatory Visit: Payer: Medicaid Other | Admitting: Hematology and Oncology

## 2016-02-10 ENCOUNTER — Inpatient Hospital Stay: Payer: Medicaid Other | Admitting: Internal Medicine

## 2016-02-14 ENCOUNTER — Telehealth: Payer: Self-pay | Admitting: *Deleted

## 2016-02-14 DIAGNOSIS — C9000 Multiple myeloma not having achieved remission: Secondary | ICD-10-CM

## 2016-02-14 DIAGNOSIS — F419 Anxiety disorder, unspecified: Secondary | ICD-10-CM

## 2016-02-14 MED ORDER — HYDROMORPHONE HCL 2 MG PO TABS: 2.0000 mg | ORAL_TABLET | ORAL | 0 refills | Status: DC | PRN

## 2016-02-14 MED ORDER — ALPRAZOLAM 0.5 MG PO TABS
0.5000 mg | ORAL_TABLET | Freq: Three times a day (TID) | ORAL | 0 refills | Status: DC | PRN
Start: 2016-02-14 — End: 2016-03-13

## 2016-02-14 NOTE — Telephone Encounter (Signed)
Patient called needing medication refill.

## 2016-02-16 ENCOUNTER — Inpatient Hospital Stay (HOSPITAL_BASED_OUTPATIENT_CLINIC_OR_DEPARTMENT_OTHER): Payer: Medicaid Other | Admitting: Internal Medicine

## 2016-02-16 VITALS — BP 96/64 | HR 89 | Temp 96.5°F | Wt 126.5 lb

## 2016-02-16 DIAGNOSIS — Z79899 Other long term (current) drug therapy: Secondary | ICD-10-CM

## 2016-02-16 DIAGNOSIS — C9002 Multiple myeloma in relapse: Secondary | ICD-10-CM

## 2016-02-16 DIAGNOSIS — F419 Anxiety disorder, unspecified: Secondary | ICD-10-CM

## 2016-02-16 DIAGNOSIS — R911 Solitary pulmonary nodule: Secondary | ICD-10-CM

## 2016-02-16 DIAGNOSIS — R5383 Other fatigue: Secondary | ICD-10-CM

## 2016-02-16 DIAGNOSIS — I252 Old myocardial infarction: Secondary | ICD-10-CM

## 2016-02-16 DIAGNOSIS — K219 Gastro-esophageal reflux disease without esophagitis: Secondary | ICD-10-CM

## 2016-02-16 DIAGNOSIS — G8929 Other chronic pain: Secondary | ICD-10-CM | POA: Diagnosis not present

## 2016-02-16 DIAGNOSIS — I509 Heart failure, unspecified: Secondary | ICD-10-CM

## 2016-02-16 DIAGNOSIS — J449 Chronic obstructive pulmonary disease, unspecified: Secondary | ICD-10-CM

## 2016-02-16 DIAGNOSIS — I2510 Atherosclerotic heart disease of native coronary artery without angina pectoris: Secondary | ICD-10-CM

## 2016-02-16 DIAGNOSIS — Q211 Atrial septal defect: Secondary | ICD-10-CM

## 2016-02-16 DIAGNOSIS — Z7982 Long term (current) use of aspirin: Secondary | ICD-10-CM

## 2016-02-16 DIAGNOSIS — F17210 Nicotine dependence, cigarettes, uncomplicated: Secondary | ICD-10-CM

## 2016-02-16 DIAGNOSIS — E785 Hyperlipidemia, unspecified: Secondary | ICD-10-CM

## 2016-02-16 NOTE — Assessment & Plan Note (Addendum)
#  MULTIPLE MYELOMA- status post induction Velcade Revlimid and dexamethasone with a very good partial response. Jan 2018- M protein- 0.3gm/dl; K/L- slightly abnormal.  Continue Revlimid 25 mg 3 weeks on and one-week off.  No concerns for progression at this time.  Educated the patient regarding potential signs and symptoms of relapse myeloma.  # chronic pain/anxiety- on fentanyl [137.5mcg] and Dilaudid; Xanax.   # Chronic fatigue multifactorial- stable.   # follow up in 2 months/ cbc/cmp/m-panel labs.  

## 2016-02-16 NOTE — Progress Notes (Signed)
[place] OFFICE PROGRESS NOTE  Patient Care Team: Petra Kuba, MD as PCP - General (Family Medicine)   SUMMARY OF ONCOLOGIC HISTORY:  Oncology History   # SEP 2015- MULTIPLE MYELOMA [multiple PET pos Bone lesions; hypercalcemia s/p BMBx; FISH- Aneuploidy - gain of chromosome 7,9,15,FGFR3/4p16.3, and CCND1/11q13. Loss of MAF/16q23.1), cytogenetics normal 46XY] s/p Vel-Dex-Rev-Zometa; Excellent PR;   # MARCH 2016- REV-DEX ; FEB 2017-[discn Dex] Rev 25 mg 3 w On & 1 w Off; March 2017- M- protein 0.4gm/dl; K/L= 7.9; cont Rev  # March 2017-  Left Middle Lobe cavitary lesion- ~12m- repeat Ct in 3-434m# AUG 2017- ONJ [[name], [place] #  ? ONJ- [phone] not follow up ]- DISCONT Zometa.   # Chronic pain/Anxiety [ortho]  # COPD/smoking/ [[facility]- not candidate for BMT- sec to co-morbidities/poor nutritional status];      [facility]      INTERVAL HISTORY:  [age]ear old male patient with above history of multiple myeloma on maintenance Revlimid Is here for follow-up.  Patient had episode of nausea vomiting with shortness of breath cough- approximately month ago. Currently resolved.   Complains of chronic fatigue. Denies any worsening shortness of breath or cough. Continues to have chronic cough. Unfortunately continues to smoke.    REVIEW OF SYSTEMS:  A complete 10 point review of system is done which is negative except mentioned above/history of present illness.   PAST MEDICAL HISTORY :  Past Medical History:  Diagnosis Date  . Anxiety   . Atrial septal defect   . CHF (congestive heart failure) ([facility]  . COPD (chronic obstructive pulmonary disease) ([facility] [place]  . Coronary artery disease   . Dysrhythmias   . GERD (gastroesophageal reflux disease)   . Hyperlipidemia   . Multiple myeloma ([facility]  . Multiple myeloma ([facility]  . Myocardial infarction   . Substance abuse     PAST SURGICAL HISTORY :  No past surgical history on  file.  FAMILY HISTORY :   Family History  Problem Relation Age of Onset  . COPD Mother   . CAD Mother   . Heart attack Father     SOCIAL HISTORY:   Social History  Substance Use Topics  . Smoking status: Current Every Day Smoker    Packs/day: 0.50    Years: 40.00    Types: Cigarettes  . Smokeless tobacco: Never Used  . Alcohol use No    ALLERGIES:  has No Known Allergies.  MEDICATIONS:  Current Outpatient Prescriptions  Medication Sig Dispense Refill  . albuterol (PROVENTIL HFA;VENTOLIN HFA) 108 (90 BASE) MCG/ACT inhaler Inhale 2 puffs into the lungs every 6 (six) hours as needed for wheezing or shortness of breath.    . ALPRAZolam (XANAX) 0.5 MG tablet Take 1 tablet (0.5 mg total) by mouth 3 (three) times daily as needed for anxiety. 90 tablet 0  . aspirin 325 MG EC tablet Take 325 mg by mouth daily.    . carvedilol (COREG) 3.125 MG tablet Take 3.125 mg by mouth 2 (two) times daily with a meal.    . docusate sodium (COLACE) 100 MG capsule Take 100 mg by mouth 2 (two) times daily. Reported on 04/07/2015    . DULoxetine (CYMBALTA) 30 MG capsule Take 1 capsule (30 mg total) by mouth daily. 30 capsule 3  . fentaNYL (DURAGESIC - DOSED MCG/HR) 100 MCG/HR Place 1 patch (100 mcg total) onto the skin every other day. 15 patch 0  .  Fluticasone-Salmeterol (ADVAIR) 100-50 MCG/DOSE AEPB Inhale 1 puff into the lungs 2 (two) times daily.    Marland Kitchen gabapentin (NEURONTIN) 100 MG capsule Take 100 mg by mouth 3 (three) times daily.    Marland Kitchen HYDROcodone-acetaminophen (NORCO/VICODIN) 5-325 MG tablet TAKE ONE TABLET BY MOUTH EVERY 4 TO 6 HOURS AS NEEDED FOR PAIN  0  . HYDROmorphone (DILAUDID) 2 MG tablet Take 1 tablet (2 mg total) by mouth every 4 (four) hours as needed for severe pain. 120 tablet 0  . ibuprofen (ADVIL,MOTRIN) 600 MG tablet Take 600 mg by mouth every 6 (six) hours as needed. for pain  0  . ipratropium-albuterol (DUONEB) 0.5-2.5 (3) MG/3ML SOLN Take 3 mLs by nebulization every 4 (four) hours  as needed. 360 mL 3  . lenalidomide (REVLIMID) 25 MG capsule Take 1 capsule (25 mg total) by mouth daily. For 21 days then off 7 days 21 capsule 0  . polyethylene glycol (MIRALAX / GLYCOLAX) packet Take 17 g by mouth daily. Reported on 04/07/2015    . promethazine (PHENERGAN) 25 MG tablet Take 1 tablet (25 mg total) by mouth every 4 (four) hours as needed for nausea or vomiting. 60 tablet 0  . tiotropium (SPIRIVA) 18 MCG inhalation capsule Place 1 capsule (18 mcg total) into inhaler and inhale daily. 30 capsule 12  . chlorhexidine (PERIDEX) 0.12 % solution SWISS AND EXPECTORATE 10 MLS BY MOUTH TWICE DAILY  0   No current facility-administered medications for this visit.    Facility-Administered Medications Ordered in Other Visits  Medication Dose Route Frequency Provider Last Rate Last Dose  . 0.9 %  sodium chloride infusion   Intravenous Continuous Cammie Sickle, MD 10 mL/hr at 02/24/15 1520      PHYSICAL EXAMINATION: ECOG PERFORMANCE STATUS: 2 - Symptomatic, <50% confined to bed  BP 96/64 (BP Location: Left Arm, Patient Position: Sitting)   Pulse 89   Temp (!) 96.5 F (35.8 C) (Tympanic)   Wt 126 lb 8 oz (57.4 kg)   BMI 17.16 kg/m   Filed Weights   02/16/16 1536  Weight: 126 lb 8 oz (57.4 kg)    GENERAL: Thin build cachectic-appearing Caucasian male patient. Alert, in  Mild- nod pain; .  Accompanied by friend/care giver.  he is walking by himself EYES: no pallor or icterus OROPHARYNX: Jaw bone exposed left lower jaw poor dentition. NECK: supple, no masses felt LYMPH:  no palpable lymphadenopathy in the cervical, axillary or inguinal regions LUNGS: With decreased bilateral breath sounds. No wheeze or crackles HEART/CVS: regular rate & rhythm and no murmurs;  No lower extremity edema. ABDOMEN:abdomen soft, non-tender and normal bowel sounds Musculoskeletal:no cyanosis of digits and no clubbing  PSYCH: alert & oriented x 3 with fluent speech NEURO: no focal motor/sensory  deficits SKIN:  no rashes or significant lesions  LABORATORY DATA:  I have reviewed the data as listed    Component Value Date/Time   NA 137 02/03/2016 1405   NA 135 05/12/2014 1343   K 4.0 02/03/2016 1405   K 3.3 (L) 05/12/2014 1343   CL 102 02/03/2016 1405   CL 101 05/12/2014 1343   CO2 31 02/03/2016 1405   CO2 27 05/12/2014 1343   GLUCOSE 99 02/03/2016 1405   GLUCOSE 77 05/12/2014 1343   BUN 7 02/03/2016 1405   BUN 9 05/12/2014 1343   CREATININE 0.91 02/03/2016 1405   CREATININE 0.89 05/12/2014 1343   CALCIUM 9.2 02/03/2016 1405   CALCIUM 8.8 (L) 05/12/2014 1343   PROT 7.2  02/03/2016 1405   PROT 5.9 (L) 02/17/2014 1405   ALBUMIN 4.3 02/03/2016 1405   ALBUMIN 2.8 (L) 02/17/2014 1405   AST 14 (L) 02/03/2016 1405   AST 9 (L) 02/17/2014 1405   ALT 7 (L) 02/03/2016 1405   ALT 12 (L) 02/17/2014 1405   ALKPHOS 35 (L) 02/03/2016 1405   ALKPHOS 44 (L) 02/17/2014 1405   BILITOT 0.6 02/03/2016 1405   BILITOT 0.2 02/17/2014 1405   GFRNONAA >60 02/03/2016 1405   GFRNONAA >60 05/12/2014 1343   GFRAA >60 02/03/2016 1405   GFRAA >60 05/12/2014 1343    No results found for: SPEP, UPEP  Lab Results  Component Value Date   WBC 5.1 02/03/2016   NEUTROABS 2.9 02/03/2016   HGB 13.3 02/03/2016   HCT 39.1 (L) 02/03/2016   MCV 98.5 02/03/2016   PLT 271 02/03/2016      Chemistry      Component Value Date/Time   NA 137 02/03/2016 1405   NA 135 05/12/2014 1343   K 4.0 02/03/2016 1405   K 3.3 (L) 05/12/2014 1343   CL 102 02/03/2016 1405   CL 101 05/12/2014 1343   CO2 31 02/03/2016 1405   CO2 27 05/12/2014 1343   BUN 7 02/03/2016 1405   BUN 9 05/12/2014 1343   CREATININE 0.91 02/03/2016 1405   CREATININE 0.89 05/12/2014 1343      Component Value Date/Time   CALCIUM 9.2 02/03/2016 1405   CALCIUM 8.8 (L) 05/12/2014 1343   ALKPHOS 35 (L) 02/03/2016 1405   ALKPHOS 44 (L) 02/17/2014 1405   AST 14 (L) 02/03/2016 1405   AST 9 (L) 02/17/2014 1405   ALT 7 (L) 02/03/2016 1405    ALT 12 (L) 02/17/2014 1405   BILITOT 0.6 02/03/2016 1405   BILITOT 0.2 02/17/2014 1405      Results for TEAGAN, HEIDRICK A (MRN [id number]) as of 11/29/2015 16:11  Ref. Range 07/13/2015 12:38 07/14/2015 14:00 09/02/2015 13:15 09/09/2015 13:33 11/08/2015 13:50  M Protein SerPl Elph-Mcnc Latest Ref Range: Not Observed g/dL    0.3 (H) 0.3 (H)  Results for IKER, NUTTALL (MRN [id number]) as of 11/29/2015 16:11  Ref. Range 01/06/2015 09:43 04/05/2015 09:09 06/02/2015 13:20 09/09/2015 13:33 11/08/2015 13:50  Kappa, lamda light chain ratio Latest Ref Range: 0.26 - 1.65  10.07 (H) 7.95 (H) 5.62 (H) 3.04 (H) 2.74 (H)       ASSESSMENT & PLAN:   Multiple myeloma in relapse ([place]) # MULTIPLE MYELOMA- status post induction Velcade Revlimid and dexamethasone with a very good partial response. Jan 2018- M protein- 0.3gm/dl; K/L- slightly abnormal.  Continue Revlimid 25 mg 3 weeks on and one-week off.  No concerns for progression at this time.  Educated the patient regarding potential signs and symptoms of relapse myeloma.  # chronic pain/anxiety- on fentanyl [137.48mg] and Dilaudid; Xanax.   # Chronic fatigue multifactorial- stable.   # follow up in 2 months/ cbc/cmp/m-panel labs.      GCammie Sickle MD 02/16/2016 4:07 PM

## 2016-02-16 NOTE — Progress Notes (Signed)
Patient here today for follow up.  Patient states no new concerns today  

## 2016-02-21 ENCOUNTER — Other Ambulatory Visit: Payer: Self-pay | Admitting: *Deleted

## 2016-02-21 DIAGNOSIS — F419 Anxiety disorder, unspecified: Secondary | ICD-10-CM

## 2016-02-21 DIAGNOSIS — C9000 Multiple myeloma not having achieved remission: Secondary | ICD-10-CM

## 2016-02-21 DIAGNOSIS — J449 Chronic obstructive pulmonary disease, unspecified: Secondary | ICD-10-CM

## 2016-02-21 MED ORDER — FENTANYL 100 MCG/HR TD PT72: 100.0000 ug | MEDICATED_PATCH | TRANSDERMAL | 0 refills | Status: DC

## 2016-02-24 ENCOUNTER — Other Ambulatory Visit: Payer: Self-pay | Admitting: *Deleted

## 2016-02-24 MED ORDER — PROMETHAZINE HCL 25 MG PO TABS: 25.0000 mg | ORAL_TABLET | ORAL | 0 refills | Status: DC | PRN

## 2016-03-13 ENCOUNTER — Other Ambulatory Visit: Payer: Self-pay | Admitting: *Deleted

## 2016-03-13 DIAGNOSIS — C9000 Multiple myeloma not having achieved remission: Secondary | ICD-10-CM

## 2016-03-13 DIAGNOSIS — F419 Anxiety disorder, unspecified: Secondary | ICD-10-CM

## 2016-03-13 MED ORDER — ALPRAZOLAM 0.5 MG PO TABS: 0.5000 mg | ORAL_TABLET | Freq: Three times a day (TID) | ORAL | 0 refills | Status: DC | PRN

## 2016-03-13 MED ORDER — HYDROMORPHONE HCL 2 MG PO TABS: 2.0000 mg | ORAL_TABLET | ORAL | 0 refills | Status: DC | PRN

## 2016-03-17 ENCOUNTER — Other Ambulatory Visit: Payer: Self-pay | Admitting: Internal Medicine

## 2016-03-20 ENCOUNTER — Other Ambulatory Visit: Payer: Self-pay | Admitting: *Deleted

## 2016-03-20 MED ORDER — PROMETHAZINE HCL 25 MG PO TABS: 25.0000 mg | ORAL_TABLET | ORAL | 0 refills | Status: DC | PRN

## 2016-03-23 ENCOUNTER — Other Ambulatory Visit: Payer: Self-pay | Admitting: Internal Medicine

## 2016-03-23 ENCOUNTER — Telehealth: Payer: Self-pay | Admitting: *Deleted

## 2016-03-23 DIAGNOSIS — C9000 Multiple myeloma not having achieved remission: Secondary | ICD-10-CM

## 2016-03-23 DIAGNOSIS — J449 Chronic obstructive pulmonary disease, unspecified: Secondary | ICD-10-CM

## 2016-03-23 DIAGNOSIS — F419 Anxiety disorder, unspecified: Secondary | ICD-10-CM

## 2016-03-23 MED ORDER — FENTANYL 100 MCG/HR TD PT72: 100.0000 ug | MEDICATED_PATCH | TRANSDERMAL | 0 refills | Status: DC

## 2016-03-23 NOTE — Telephone Encounter (Signed)
States pharmacy told him he did not have refill on promethazine. Confirmed it was sent 2/26. Fentanyl rx written

## 2016-04-05 ENCOUNTER — Telehealth: Payer: Self-pay | Admitting: *Deleted

## 2016-04-05 ENCOUNTER — Inpatient Hospital Stay: Payer: Medicaid Other | Attending: Internal Medicine

## 2016-04-05 DIAGNOSIS — C9002 Multiple myeloma in relapse: Secondary | ICD-10-CM

## 2016-04-05 DIAGNOSIS — E876 Hypokalemia: Secondary | ICD-10-CM

## 2016-04-05 LAB — CBC WITH DIFFERENTIAL/PLATELET
BASOS PCT: 1 %
Basophils Absolute: 0.1 10*3/uL (ref 0–0.1)
EOS ABS: 0.2 10*3/uL (ref 0–0.7)
Eosinophils Relative: 3 %
HCT: 39.7 % — ABNORMAL LOW (ref 40.0–52.0)
HEMOGLOBIN: 13.6 g/dL (ref 13.0–18.0)
Lymphocytes Relative: 22 %
Lymphs Abs: 1.5 10*3/uL (ref 1.0–3.6)
MCH: 33.8 pg (ref 26.0–34.0)
MCHC: 34.4 g/dL (ref 32.0–36.0)
MCV: 98.3 fL (ref 80.0–100.0)
MONO ABS: 0.8 10*3/uL (ref 0.2–1.0)
Monocytes Relative: 11 %
NEUTROS PCT: 63 %
Neutro Abs: 4.3 10*3/uL (ref 1.4–6.5)
Platelets: 394 10*3/uL (ref 150–440)
RBC: 4.04 MIL/uL — ABNORMAL LOW (ref 4.40–5.90)
RDW: 15.3 % — AB (ref 11.5–14.5)
WBC: 6.8 10*3/uL (ref 3.8–10.6)

## 2016-04-05 LAB — COMPREHENSIVE METABOLIC PANEL
ALT: 7 U/L — ABNORMAL LOW (ref 17–63)
ANION GAP: 14 (ref 5–15)
AST: 17 U/L (ref 15–41)
Albumin: 3.9 g/dL (ref 3.5–5.0)
Alkaline Phosphatase: 48 U/L (ref 38–126)
BUN: 7 mg/dL (ref 6–20)
CO2: 40 mmol/L — AB (ref 22–32)
Calcium: 9.3 mg/dL (ref 8.9–10.3)
Chloride: 81 mmol/L — ABNORMAL LOW (ref 101–111)
Creatinine, Ser: 0.99 mg/dL (ref 0.61–1.24)
GFR calc Af Amer: >60 mL/min (ref >60)
GFR calc non Af Amer: >60 mL/min (ref >60)
GLUCOSE: 103 mg/dL — AB (ref 65–99)
POTASSIUM: 2.4 mmol/L — AB (ref 3.5–5.1)
SODIUM: 135 mmol/L (ref 135–145)
Total Bilirubin: 0.6 mg/dL (ref 0.3–1.2)
Total Protein: 7.9 g/dL (ref 6.5–8.1)

## 2016-04-05 MED ORDER — POTASSIUM CHLORIDE CRYS ER 20 MEQ PO TBCR: EXTENDED_RELEASE_TABLET | ORAL | 0 refills | Status: DC

## 2016-04-05 NOTE — Telephone Encounter (Signed)
Critical lab value.    Potassium of 2.4.  Dr Rogue Bussing notified.  Per Dr Rogue Bussing order K-Dur 20 MEQ tablet, TID for 1 week, once daily after the first week.  Add BMP to lab visit on 04/14/16.

## 2016-04-07 LAB — KAPPA/LAMBDA LIGHT CHAINS
Kappa free light chain: 54.7 mg/L — ABNORMAL HIGH (ref 3.3–19.4)
Kappa, lambda light chain ratio: 2.17 — ABNORMAL HIGH (ref 0.26–1.65)
Lambda free light chains: 25.2 mg/L (ref 5.7–26.3)

## 2016-04-13 ENCOUNTER — Other Ambulatory Visit: Payer: Self-pay | Admitting: *Deleted

## 2016-04-13 DIAGNOSIS — C9000 Multiple myeloma not having achieved remission: Secondary | ICD-10-CM

## 2016-04-13 DIAGNOSIS — F419 Anxiety disorder, unspecified: Secondary | ICD-10-CM

## 2016-04-13 MED ORDER — ALPRAZOLAM 0.5 MG PO TABS: 0.5000 mg | ORAL_TABLET | Freq: Three times a day (TID) | ORAL | 0 refills | Status: DC | PRN

## 2016-04-13 MED ORDER — HYDROMORPHONE HCL 2 MG PO TABS: 2.0000 mg | ORAL_TABLET | ORAL | 0 refills | Status: DC | PRN

## 2016-04-13 MED ORDER — PROMETHAZINE HCL 25 MG PO TABS: 25.0000 mg | ORAL_TABLET | ORAL | 0 refills | Status: DC | PRN

## 2016-04-13 MED ORDER — HYDROCODONE-ACETAMINOPHEN 5-325 MG PO TABS: ORAL_TABLET | ORAL | 0 refills | Status: DC

## 2016-04-14 ENCOUNTER — Inpatient Hospital Stay: Payer: Medicaid Other

## 2016-04-14 ENCOUNTER — Inpatient Hospital Stay: Payer: Medicaid Other | Admitting: Internal Medicine

## 2016-04-20 ENCOUNTER — Other Ambulatory Visit: Payer: Self-pay | Admitting: *Deleted

## 2016-04-20 DIAGNOSIS — C9000 Multiple myeloma not having achieved remission: Secondary | ICD-10-CM

## 2016-04-20 DIAGNOSIS — J449 Chronic obstructive pulmonary disease, unspecified: Secondary | ICD-10-CM

## 2016-04-20 DIAGNOSIS — F419 Anxiety disorder, unspecified: Secondary | ICD-10-CM

## 2016-04-21 MED ORDER — FENTANYL 100 MCG/HR TD PT72: 100.0000 ug | MEDICATED_PATCH | TRANSDERMAL | 0 refills | Status: DC

## 2016-05-03 ENCOUNTER — Other Ambulatory Visit: Payer: Self-pay | Admitting: Internal Medicine

## 2016-05-05 ENCOUNTER — Inpatient Hospital Stay (HOSPITAL_BASED_OUTPATIENT_CLINIC_OR_DEPARTMENT_OTHER): Payer: Medicaid Other | Admitting: Internal Medicine

## 2016-05-05 ENCOUNTER — Inpatient Hospital Stay: Payer: Medicaid Other | Attending: Internal Medicine

## 2016-05-05 VITALS — BP 90/61 | HR 116 | Temp 97.3°F | Resp 18 | Wt 122.0 lb

## 2016-05-05 DIAGNOSIS — E785 Hyperlipidemia, unspecified: Secondary | ICD-10-CM | POA: Diagnosis not present

## 2016-05-05 DIAGNOSIS — I252 Old myocardial infarction: Secondary | ICD-10-CM | POA: Diagnosis not present

## 2016-05-05 DIAGNOSIS — C9000 Multiple myeloma not having achieved remission: Secondary | ICD-10-CM | POA: Diagnosis not present

## 2016-05-05 DIAGNOSIS — F17210 Nicotine dependence, cigarettes, uncomplicated: Secondary | ICD-10-CM | POA: Insufficient documentation

## 2016-05-05 DIAGNOSIS — G8929 Other chronic pain: Secondary | ICD-10-CM | POA: Diagnosis not present

## 2016-05-05 DIAGNOSIS — Z7982 Long term (current) use of aspirin: Secondary | ICD-10-CM | POA: Insufficient documentation

## 2016-05-05 DIAGNOSIS — R5382 Chronic fatigue, unspecified: Secondary | ICD-10-CM

## 2016-05-05 DIAGNOSIS — I2510 Atherosclerotic heart disease of native coronary artery without angina pectoris: Secondary | ICD-10-CM

## 2016-05-05 DIAGNOSIS — Z9119 Patient's noncompliance with other medical treatment and regimen: Secondary | ICD-10-CM

## 2016-05-05 DIAGNOSIS — I509 Heart failure, unspecified: Secondary | ICD-10-CM

## 2016-05-05 DIAGNOSIS — R059 Cough, unspecified: Secondary | ICD-10-CM | POA: Insufficient documentation

## 2016-05-05 DIAGNOSIS — C9002 Multiple myeloma in relapse: Secondary | ICD-10-CM

## 2016-05-05 DIAGNOSIS — Z79899 Other long term (current) drug therapy: Secondary | ICD-10-CM

## 2016-05-05 DIAGNOSIS — E876 Hypokalemia: Secondary | ICD-10-CM | POA: Insufficient documentation

## 2016-05-05 DIAGNOSIS — J449 Chronic obstructive pulmonary disease, unspecified: Secondary | ICD-10-CM

## 2016-05-05 DIAGNOSIS — F411 Generalized anxiety disorder: Secondary | ICD-10-CM

## 2016-05-05 DIAGNOSIS — F419 Anxiety disorder, unspecified: Secondary | ICD-10-CM

## 2016-05-05 DIAGNOSIS — R05 Cough: Secondary | ICD-10-CM | POA: Insufficient documentation

## 2016-05-05 LAB — COMPREHENSIVE METABOLIC PANEL
ALT: 7 U/L — ABNORMAL LOW (ref 17–63)
AST: 15 U/L (ref 15–41)
Albumin: 3.5 g/dL (ref 3.5–5.0)
Alkaline Phosphatase: 43 U/L (ref 38–126)
Anion gap: 10 (ref 5–15)
BUN: 11 mg/dL (ref 6–20)
CHLORIDE: 90 mmol/L — AB (ref 101–111)
CO2: 35 mmol/L — AB (ref 22–32)
Calcium: 8.9 mg/dL (ref 8.9–10.3)
Creatinine, Ser: 0.97 mg/dL (ref 0.61–1.24)
GFR calc non Af Amer: >60 mL/min (ref >60)
Glucose, Bld: 127 mg/dL — ABNORMAL HIGH (ref 65–99)
Potassium: 2.7 mmol/L — CL (ref 3.5–5.1)
SODIUM: 135 mmol/L (ref 135–145)
Total Bilirubin: 0.5 mg/dL (ref 0.3–1.2)
Total Protein: 7.2 g/dL (ref 6.5–8.1)

## 2016-05-05 LAB — CBC WITH DIFFERENTIAL/PLATELET
Basophils Absolute: 0.1 10*3/uL (ref 0–0.1)
Basophils Relative: 2 %
EOS ABS: 0.3 10*3/uL (ref 0–0.7)
EOS PCT: 5 %
HCT: 34.4 % — ABNORMAL LOW (ref 40.0–52.0)
Hemoglobin: 12.1 g/dL — ABNORMAL LOW (ref 13.0–18.0)
LYMPHS ABS: 1 10*3/uL (ref 1.0–3.6)
Lymphocytes Relative: 16 %
MCH: 35.2 pg — AB (ref 26.0–34.0)
MCHC: 35.2 g/dL (ref 32.0–36.0)
MCV: 100.1 fL — ABNORMAL HIGH (ref 80.0–100.0)
MONOS PCT: 6 %
Monocytes Absolute: 0.3 10*3/uL (ref 0.2–1.0)
Neutro Abs: 4.3 10*3/uL (ref 1.4–6.5)
Neutrophils Relative %: 71 %
PLATELETS: 300 10*3/uL (ref 150–440)
RBC: 3.44 MIL/uL — AB (ref 4.40–5.90)
RDW: 16.7 % — ABNORMAL HIGH (ref 11.5–14.5)
WBC: 6 10*3/uL (ref 3.8–10.6)

## 2016-05-05 NOTE — Progress Notes (Signed)
Patient here today for follow up.  Patient c/o vomiting and pain in back

## 2016-05-05 NOTE — Progress Notes (Signed)
1014-Critical potassium 2.7 called by Marc Morgans in [facility] lab. Read back process performed with lab tech.  Read back process performed with Dr. Rogue Bussing for critical lab at 1018 am

## 2016-05-05 NOTE — Progress Notes (Signed)
[name] OFFICE PROGRESS NOTE  Patient Care Team: Petra Kuba, MD as PCP - General (Family Medicine)   SUMMARY OF ONCOLOGIC HISTORY:  Oncology History   # SEP 2015- MULTIPLE MYELOMA [multiple PET pos Bone lesions; hypercalcemia s/p BMBx; FISH- Aneuploidy - gain of chromosome 7,9,15,FGFR3/4p16.3, and CCND1/11q13. Loss of MAF/16q23.1), cytogenetics normal 46XY] s/p Vel-Dex-Rev-Zometa; Excellent PR;   # MARCH 2016- REV-DEX ; FEB 2017-[discn Dex] Rev 25 mg 3 w On & 1 w Off; March 2017- M- protein 0.4gm/dl; K/L= 7.9; cont Rev  # March 2017-  Left Middle Lobe cavitary lesion- ~48m- repeat Ct in 3-481m# AUG 2017- ONJ [[name], [place] #  ? ONJ- [phone] not follow up ]- DISCONT Zometa.   # Chronic pain/Anxiety [ortho]  # COPD/smoking/ [[facility]- not candidate for BMT- sec to co-morbidities/poor nutritional status];      [facility]      INTERVAL HISTORY:  [age]ear old male patient with above history of multiple myeloma on maintenance Revlimid Is here for follow-up.  Patient admits to noncompliance with his potassium supplementation. However denies any diarrhea.  More recently patient noted to have worsening cough without hemoptysis. mild Worsening shortness of breath. No fever no chills. Overall symptoms are improving. Not has been on antibiotics. Unfortunately continues to smoke.    Patient continues to complain of significant stress because of social issues. He wants his dose of Xanax increased. He also complains of jaw pain on the left side. Poor dentition.  REVIEW OF SYSTEMS:  A complete 10 point review of system is done which is negative except mentioned above/history of present illness.   PAST MEDICAL HISTORY :  Past Medical History:  Diagnosis Date  . Anxiety   . Atrial septal defect   . CHF (congestive heart failure) ([facility]  . COPD (chronic obstructive pulmonary disease) (HCK[facility]  . Coronary artery disease   .  Dysrhythmias   . GERD (gastroesophageal reflux disease)   . Hyperlipidemia   . Multiple myeloma (HCD[facility]  . Multiple myeloma (HCL[place]  . Myocardial infarction   . Substance abuse     PAST SURGICAL HISTORY :  No past surgical history on file.  FAMILY HISTORY :   Family History  Problem Relation Age of Onset  . COPD Mother   . CAD Mother   . Heart attack Father     SOCIAL HISTORY:   Social History  Substance Use Topics  . Smoking status: Current Every Day Smoker    Packs/day: 0.50    Years: 40.00    Types: Cigarettes  . Smokeless tobacco: Never Used  . Alcohol use No    ALLERGIES:  has No Known Allergies.  MEDICATIONS:  Current Outpatient Prescriptions  Medication Sig Dispense Refill  . albuterol (PROVENTIL HFA;VENTOLIN HFA) 108 (90 BASE) MCG/ACT inhaler Inhale 2 puffs into the lungs every 6 (six) hours as needed for wheezing or shortness of breath.    . ALPRAZolam (XANAX) 0.5 MG tablet Take 1 tablet (0.5 mg total) by mouth 3 (three) times daily as needed for anxiety. 90 tablet 0  . aspirin 325 MG EC tablet Take 325 mg by mouth daily.    . carvedilol (COREG) 3.125 MG tablet Take 3.125 mg by mouth 2 (two) times daily with a meal.    . docusate sodium (COLACE) 100 MG capsule Take 100 mg by mouth 2 (two) times daily. Reported on 04/07/2015    . DULoxetine (CYMBALTA) 30 MG capsule  TAKE ONE CAPSULE BY MOUTH EVERY DAY 90 capsule 1  . fentaNYL (DURAGESIC - DOSED MCG/HR) 100 MCG/HR Place 1 patch (100 mcg total) onto the skin every other day. 15 patch 0  . Fluticasone-Salmeterol (ADVAIR) 100-50 MCG/DOSE AEPB Inhale 1 puff into the lungs 2 (two) times daily.    . furosemide (LASIX) 40 MG tablet Take 40 mg by mouth daily.  2  . gabapentin (NEURONTIN) 100 MG capsule Take 100 mg by mouth 3 (three) times daily.    Marland Kitchen HYDROmorphone (DILAUDID) 2 MG tablet Take 1 tablet (2 mg total) by mouth every 4 (four) hours as needed for severe pain. 120 tablet 0  . ibuprofen (ADVIL,MOTRIN) 600 MG tablet  Take 600 mg by mouth every 6 (six) hours as needed. for pain  0  . ipratropium-albuterol (DUONEB) 0.5-2.5 (3) MG/3ML SOLN Take 3 mLs by nebulization every 4 (four) hours as needed. 360 mL 3  . lenalidomide (REVLIMID) 25 MG capsule Take 1 capsule (25 mg total) by mouth daily. For 21 days then off 7 days 21 capsule 0  . potassium chloride SA (K-DUR,KLOR-CON) 20 MEQ tablet Take one tablet 3 times daily for the first week.  Take one tablet daily after first week. 44 tablet 0  . promethazine (PHENERGAN) 25 MG tablet TAKE ONE TABLET BY MOUTH EVERY 4 HOURS AS NEEDED FOR NAUSEA 60 tablet 1  . tiotropium (SPIRIVA) 18 MCG inhalation capsule Place 1 capsule (18 mcg total) into inhaler and inhale daily. 30 capsule 12   No current facility-administered medications for this visit.    Facility-Administered Medications Ordered in Other Visits  Medication Dose Route Frequency Provider Last Rate Last Dose  . 0.9 %  sodium chloride infusion   Intravenous Continuous Cammie Sickle, MD 10 mL/hr at 02/24/15 1520      PHYSICAL EXAMINATION: ECOG PERFORMANCE STATUS: 2 - Symptomatic, <50% confined to bed  BP 90/61 (BP Location: Left Arm, Patient Position: Sitting)   Pulse (!) 116   Temp 97.3 F (36.3 C) (Tympanic)   Resp 18   Wt 122 lb (55.3 kg)   SpO2 92%   BMI 16.55 kg/m   Filed Weights   05/05/16 1015  Weight: 122 lb (55.3 kg)    GENERAL: Thin build cachectic-appearing Caucasian male patient. Alert, in  Mild- nod pain; .  Accompanied by friend/care giver.  he is walking by himself EYES: no pallor or icterus OROPHARYNX: Jaw bone exposed left lower jaw poor dentition. NECK: supple, no masses felt LYMPH:  no palpable lymphadenopathy in the cervical, axillary or inguinal regions LUNGS: With decreased bilateral breath sounds. No wheeze or crackles HEART/CVS: regular rate & rhythm and no murmurs;  No lower extremity edema. ABDOMEN:abdomen soft, non-tender and normal bowel sounds Musculoskeletal:no  cyanosis of digits and no clubbing  PSYCH: alert & oriented x 3 with fluent speech NEURO: no focal motor/sensory deficits SKIN:  no rashes or significant lesions  LABORATORY DATA:  I have reviewed the data as listed    Component Value Date/Time   NA 135 05/05/2016 0940   NA 135 05/12/2014 1343   K 2.7 (LL) 05/05/2016 0940   K 3.3 (L) 05/12/2014 1343   CL 90 (L) 05/05/2016 0940   CL 101 05/12/2014 1343   CO2 35 (H) 05/05/2016 0940   CO2 27 05/12/2014 1343   GLUCOSE 127 (H) 05/05/2016 0940   GLUCOSE 77 05/12/2014 1343   BUN 11 05/05/2016 0940   BUN 9 05/12/2014 1343   CREATININE 0.97 05/05/2016 0940  CREATININE 0.89 05/12/2014 1343   CALCIUM 8.9 05/05/2016 0940   CALCIUM 8.8 (L) 05/12/2014 1343   PROT 7.2 05/05/2016 0940   PROT 5.9 (L) 02/17/2014 1405   ALBUMIN 3.5 05/05/2016 0940   ALBUMIN 2.8 (L) 02/17/2014 1405   AST 15 05/05/2016 0940   AST 9 (L) 02/17/2014 1405   ALT 7 (L) 05/05/2016 0940   ALT 12 (L) 02/17/2014 1405   ALKPHOS 43 05/05/2016 0940   ALKPHOS 44 (L) 02/17/2014 1405   BILITOT 0.5 05/05/2016 0940   BILITOT 0.2 02/17/2014 1405   GFRNONAA >60 05/05/2016 0940   GFRNONAA >60 05/12/2014 1343   GFRAA >60 05/05/2016 0940   GFRAA >60 05/12/2014 1343    No results found for: SPEP, UPEP  Lab Results  Component Value Date   WBC 6.0 05/05/2016   NEUTROABS 4.3 05/05/2016   HGB 12.1 (L) 05/05/2016   HCT 34.4 (L) 05/05/2016   MCV 100.1 (H) 05/05/2016   PLT 300 05/05/2016      Chemistry      Component Value Date/Time   NA 135 05/05/2016 0940   NA 135 05/12/2014 1343   K 2.7 (LL) 05/05/2016 0940   K 3.3 (L) 05/12/2014 1343   CL 90 (L) 05/05/2016 0940   CL 101 05/12/2014 1343   CO2 35 (H) 05/05/2016 0940   CO2 27 05/12/2014 1343   BUN 11 05/05/2016 0940   BUN 9 05/12/2014 1343   CREATININE 0.97 05/05/2016 0940   CREATININE 0.89 05/12/2014 1343      Component Value Date/Time   CALCIUM 8.9 05/05/2016 0940   CALCIUM 8.8 (L) 05/12/2014 1343   ALKPHOS  43 05/05/2016 0940   ALKPHOS 44 (L) 02/17/2014 1405   AST 15 05/05/2016 0940   AST 9 (L) 02/17/2014 1405   ALT 7 (L) 05/05/2016 0940   ALT 12 (L) 02/17/2014 1405   BILITOT 0.5 05/05/2016 0940   BILITOT 0.2 02/17/2014 1405      Results for AVRAHAM, BENISH A (MRN [id number]) as of 11/29/2015 16:11  Ref. Range 07/13/2015 12:38 07/14/2015 14:00 09/02/2015 13:15 09/09/2015 13:33 11/08/2015 13:50  M Protein SerPl Elph-Mcnc Latest Ref Range: Not Observed g/dL    0.3 (H) 0.3 (H)  Results for TEJUAN, GHOLSON (MRN [id number]) as of 11/29/2015 16:11  Ref. Range 01/06/2015 09:43 04/05/2015 09:09 06/02/2015 13:20 09/09/2015 13:33 11/08/2015 13:50  Kappa, lamda light chain ratio Latest Ref Range: 0.26 - 1.65  10.07 (H) 7.95 (H) 5.62 (H) 3.04 (H) 2.74 (H)       ASSESSMENT & PLAN:   Multiple myeloma in relapse ([place]) # MULTIPLE MYELOMA- status post induction Velcade Revlimid and dexamethasone with a very good partial response. Jan 2018- M protein- 0.3gm/dl; K/L- slightly abnormal.  Continue Revlimid 25 mg 3 weeks on and one-week off.  No concerns for progression at this time.  Educated the patient regarding potential signs and symptoms of relapse myeloma. Also discussed multiple options available including but not limited to pomolyst, velcade/kyprolis/ dara/elo etc.   # chronic pain/anxiety- on fentanyl [137.42mg] and Dilaudid; Xanax. Recommend psychiatrist [Dr.Porter]. I do not recommend increasing benzodiazepines and narcotics- given the concerns of respiratory depression. Patient agrees.  # hypokalemia -severe 2.7; recommend continued Kdur BID; as script at home.   # Cough/phlegm- ? Bronchitis. Chest x-ray to rule out pneumonia.  # Chronic fatigue multifactorial- stable.   # follow up in 2 months/ cbc/cmp/m-panel labs.      GCammie Sickle MD 05/05/2016 1:09 PM

## 2016-05-05 NOTE — Assessment & Plan Note (Addendum)
#  MULTIPLE MYELOMA- status post induction Velcade Revlimid and dexamethasone with a very good partial response. Jan 2018- M protein- 0.3gm/dl; K/L- slightly abnormal.  Continue Revlimid 25 mg 3 weeks on and one-week off.  No concerns for progression at this time.  Educated the patient regarding potential signs and symptoms of relapse myeloma. Also discussed multiple options available including but not limited to pomolyst, velcade/kyprolis/ dara/elo etc.   # chronic pain/anxiety- on fentanyl [137.65mg] and Dilaudid; Xanax. Recommend psychiatrist [Dr.Porter]. I do not recommend increasing benzodiazepines and narcotics- given the concerns of respiratory depression. Patient agrees.  # hypokalemia -severe 2.7; recommend continued Kdur BID; as script at home.   # Cough/phlegm- ? Bronchitis. Chest x-ray to rule out pneumonia.  # Chronic fatigue multifactorial- stable.   # follow up in 2 months/ cbc/cmp/m-panel labs.

## 2016-05-08 LAB — KAPPA/LAMBDA LIGHT CHAINS
KAPPA FREE LGHT CHN: 57.4 mg/L — AB (ref 3.3–19.4)
Kappa, lambda light chain ratio: 1.97 — ABNORMAL HIGH (ref 0.26–1.65)
LAMDA FREE LIGHT CHAINS: 29.2 mg/L — AB (ref 5.7–26.3)

## 2016-05-09 LAB — MULTIPLE MYELOMA PANEL, SERUM
ALPHA2 GLOB SERPL ELPH-MCNC: 0.9 g/dL (ref 0.4–1.0)
Albumin SerPl Elph-Mcnc: 3.3 g/dL (ref 2.9–4.4)
Albumin/Glob SerPl: 1.1 (ref 0.7–1.7)
Alpha 1: 0.3 g/dL (ref 0.0–0.4)
B-GLOBULIN SERPL ELPH-MCNC: 0.9 g/dL (ref 0.7–1.3)
Gamma Glob SerPl Elph-Mcnc: 1.2 g/dL (ref 0.4–1.8)
Globulin, Total: 3.3 g/dL (ref 2.2–3.9)
IGG (IMMUNOGLOBIN G), SERUM: 1062 mg/dL (ref 700–1600)
IGM, SERUM: 29 mg/dL (ref 20–172)
IgA: 235 mg/dL (ref 90–386)
M Protein SerPl Elph-Mcnc: 0.5 g/dL — ABNORMAL HIGH
Total Protein ELP: 6.6 g/dL (ref 6.0–8.5)

## 2016-05-11 ENCOUNTER — Other Ambulatory Visit: Payer: Self-pay | Admitting: Internal Medicine

## 2016-05-11 DIAGNOSIS — F419 Anxiety disorder, unspecified: Secondary | ICD-10-CM

## 2016-05-11 DIAGNOSIS — C9000 Multiple myeloma not having achieved remission: Secondary | ICD-10-CM

## 2016-05-11 MED ORDER — ALPRAZOLAM 0.5 MG PO TABS: 0.5000 mg | ORAL_TABLET | Freq: Three times a day (TID) | ORAL | 0 refills | Status: DC | PRN

## 2016-05-11 MED ORDER — HYDROMORPHONE HCL 2 MG PO TABS: 2.0000 mg | ORAL_TABLET | ORAL | 0 refills | Status: DC | PRN

## 2016-05-15 ENCOUNTER — Ambulatory Visit: Payer: Medicaid Other | Admitting: Internal Medicine

## 2016-05-15 ENCOUNTER — Other Ambulatory Visit: Payer: Medicaid Other

## 2016-05-19 ENCOUNTER — Other Ambulatory Visit: Payer: Self-pay | Admitting: *Deleted

## 2016-05-19 DIAGNOSIS — C9002 Multiple myeloma in relapse: Secondary | ICD-10-CM

## 2016-05-19 MED ORDER — LENALIDOMIDE 25 MG PO CAPS: 25.0000 mg | ORAL_CAPSULE | Freq: Every day | ORAL | 0 refills | Status: DC

## 2016-05-19 NOTE — Telephone Encounter (Signed)
Patient REMS survey performed rems auth - [id number].  Also contacted pt and reminded him of REMS patient survey that is also due. He stated that he will do the survey on Monday 05/22/16

## 2016-05-23 ENCOUNTER — Emergency Department: Payer: Medicaid Other

## 2016-05-23 ENCOUNTER — Other Ambulatory Visit: Payer: Self-pay | Admitting: Internal Medicine

## 2016-05-23 ENCOUNTER — Inpatient Hospital Stay
Admission: EM | Admit: 2016-05-23 | Discharge: 2016-05-28 | DRG: 871 | Disposition: A | Discharge status: Home / Self Care | Payer: Medicaid Other | Attending: Internal Medicine | Admitting: Internal Medicine

## 2016-05-23 ENCOUNTER — Encounter: Payer: Self-pay | Admitting: Emergency Medicine

## 2016-05-23 DIAGNOSIS — E785 Hyperlipidemia, unspecified: Secondary | ICD-10-CM | POA: Diagnosis present

## 2016-05-23 DIAGNOSIS — G9341 Metabolic encephalopathy: Secondary | ICD-10-CM | POA: Diagnosis present

## 2016-05-23 DIAGNOSIS — G4700 Insomnia, unspecified: Secondary | ICD-10-CM | POA: Diagnosis present

## 2016-05-23 DIAGNOSIS — J441 Chronic obstructive pulmonary disease with (acute) exacerbation: Secondary | ICD-10-CM | POA: Diagnosis present

## 2016-05-23 DIAGNOSIS — Z7189 Other specified counseling: Secondary | ICD-10-CM

## 2016-05-23 DIAGNOSIS — D649 Anemia, unspecified: Secondary | ICD-10-CM | POA: Diagnosis present

## 2016-05-23 DIAGNOSIS — Z681 Body mass index (BMI) 19 or less, adult: Secondary | ICD-10-CM

## 2016-05-23 DIAGNOSIS — E876 Hypokalemia: Secondary | ICD-10-CM

## 2016-05-23 DIAGNOSIS — R112 Nausea with vomiting, unspecified: Secondary | ICD-10-CM

## 2016-05-23 DIAGNOSIS — K219 Gastro-esophageal reflux disease without esophagitis: Secondary | ICD-10-CM | POA: Diagnosis present

## 2016-05-23 DIAGNOSIS — Z515 Encounter for palliative care: Secondary | ICD-10-CM

## 2016-05-23 DIAGNOSIS — I252 Old myocardial infarction: Secondary | ICD-10-CM

## 2016-05-23 DIAGNOSIS — C9000 Multiple myeloma not having achieved remission: Secondary | ICD-10-CM

## 2016-05-23 DIAGNOSIS — I2510 Atherosclerotic heart disease of native coronary artery without angina pectoris: Secondary | ICD-10-CM | POA: Diagnosis present

## 2016-05-23 DIAGNOSIS — G4709 Other insomnia: Secondary | ICD-10-CM

## 2016-05-23 DIAGNOSIS — I5032 Chronic diastolic (congestive) heart failure: Secondary | ICD-10-CM | POA: Diagnosis present

## 2016-05-23 DIAGNOSIS — E873 Alkalosis: Secondary | ICD-10-CM | POA: Diagnosis present

## 2016-05-23 DIAGNOSIS — Z66 Do not resuscitate: Secondary | ICD-10-CM | POA: Diagnosis present

## 2016-05-23 DIAGNOSIS — K5900 Constipation, unspecified: Secondary | ICD-10-CM

## 2016-05-23 DIAGNOSIS — E43 Unspecified severe protein-calorie malnutrition: Secondary | ICD-10-CM | POA: Diagnosis present

## 2016-05-23 DIAGNOSIS — N179 Acute kidney failure, unspecified: Secondary | ICD-10-CM | POA: Diagnosis present

## 2016-05-23 DIAGNOSIS — Z825 Family history of asthma and other chronic lower respiratory diseases: Secondary | ICD-10-CM | POA: Diagnosis not present

## 2016-05-23 DIAGNOSIS — J4489 Other specified chronic obstructive pulmonary disease: Secondary | ICD-10-CM

## 2016-05-23 DIAGNOSIS — Z8249 Family history of ischemic heart disease and other diseases of the circulatory system: Secondary | ICD-10-CM | POA: Diagnosis not present

## 2016-05-23 DIAGNOSIS — J9620 Acute and chronic respiratory failure, unspecified whether with hypoxia or hypercapnia: Secondary | ICD-10-CM | POA: Diagnosis not present

## 2016-05-23 DIAGNOSIS — I959 Hypotension, unspecified: Secondary | ICD-10-CM | POA: Diagnosis not present

## 2016-05-23 DIAGNOSIS — Z7951 Long term (current) use of inhaled steroids: Secondary | ICD-10-CM

## 2016-05-23 DIAGNOSIS — Z9981 Dependence on supplemental oxygen: Secondary | ICD-10-CM

## 2016-05-23 DIAGNOSIS — F419 Anxiety disorder, unspecified: Secondary | ICD-10-CM

## 2016-05-23 DIAGNOSIS — Z950 Presence of cardiac pacemaker: Secondary | ICD-10-CM

## 2016-05-23 DIAGNOSIS — A419 Sepsis, unspecified organism: Principal | ICD-10-CM | POA: Diagnosis present

## 2016-05-23 DIAGNOSIS — J690 Pneumonitis due to inhalation of food and vomit: Secondary | ICD-10-CM | POA: Diagnosis present

## 2016-05-23 DIAGNOSIS — G8929 Other chronic pain: Secondary | ICD-10-CM | POA: Diagnosis present

## 2016-05-23 DIAGNOSIS — R0602 Shortness of breath: Secondary | ICD-10-CM

## 2016-05-23 DIAGNOSIS — J9621 Acute and chronic respiratory failure with hypoxia: Secondary | ICD-10-CM | POA: Diagnosis present

## 2016-05-23 DIAGNOSIS — J449 Chronic obstructive pulmonary disease, unspecified: Secondary | ICD-10-CM

## 2016-05-23 DIAGNOSIS — J9622 Acute and chronic respiratory failure with hypercapnia: Secondary | ICD-10-CM | POA: Diagnosis present

## 2016-05-23 DIAGNOSIS — I420 Dilated cardiomyopathy: Secondary | ICD-10-CM | POA: Diagnosis present

## 2016-05-23 DIAGNOSIS — F17210 Nicotine dependence, cigarettes, uncomplicated: Secondary | ICD-10-CM | POA: Diagnosis present

## 2016-05-23 DIAGNOSIS — F329 Major depressive disorder, single episode, unspecified: Secondary | ICD-10-CM | POA: Diagnosis present

## 2016-05-23 DIAGNOSIS — E860 Dehydration: Secondary | ICD-10-CM | POA: Diagnosis present

## 2016-05-23 DIAGNOSIS — R1011 Right upper quadrant pain: Secondary | ICD-10-CM

## 2016-05-23 DIAGNOSIS — E871 Hypo-osmolality and hyponatremia: Secondary | ICD-10-CM | POA: Diagnosis present

## 2016-05-23 DIAGNOSIS — D709 Neutropenia, unspecified: Secondary | ICD-10-CM | POA: Diagnosis present

## 2016-05-23 DIAGNOSIS — R1110 Vomiting, unspecified: Secondary | ICD-10-CM | POA: Diagnosis present

## 2016-05-23 DIAGNOSIS — J9602 Acute respiratory failure with hypercapnia: Secondary | ICD-10-CM

## 2016-05-23 DIAGNOSIS — Z7982 Long term (current) use of aspirin: Secondary | ICD-10-CM

## 2016-05-23 DIAGNOSIS — Z79891 Long term (current) use of opiate analgesic: Secondary | ICD-10-CM

## 2016-05-23 DIAGNOSIS — J9601 Acute respiratory failure with hypoxia: Secondary | ICD-10-CM

## 2016-05-23 DIAGNOSIS — I5031 Acute diastolic (congestive) heart failure: Secondary | ICD-10-CM | POA: Diagnosis not present

## 2016-05-23 LAB — BLOOD GAS, VENOUS
ACID-BASE EXCESS: 26.2 mmol/L — AB (ref 0.0–2.0)
Bicarbonate: 53.5 mmol/L — ABNORMAL HIGH (ref 20.0–28.0)
O2 SAT: 75.6 %
PCO2 VEN: 67 mmHg — AB (ref 44.0–60.0)
PH VEN: 7.51 — AB (ref 7.250–7.430)
PO2 VEN: 36 mmHg (ref 32.0–45.0)
Patient temperature: 37

## 2016-05-23 LAB — CBC
HCT: 30.6 % — ABNORMAL LOW (ref 40.0–52.0)
Hemoglobin: 10.8 g/dL — ABNORMAL LOW (ref 13.0–18.0)
MCH: 34.7 pg — AB (ref 26.0–34.0)
MCHC: 35.2 g/dL (ref 32.0–36.0)
MCV: 98.7 fL (ref 80.0–100.0)
PLATELETS: 234 10*3/uL (ref 150–440)
RBC: 3.1 MIL/uL — ABNORMAL LOW (ref 4.40–5.90)
RDW: 15.8 % — AB (ref 11.5–14.5)
WBC: 2.3 10*3/uL — ABNORMAL LOW (ref 3.8–10.6)

## 2016-05-23 LAB — LACTIC ACID, PLASMA: Lactic Acid, Venous: 1.8 mmol/L (ref 0.5–1.9)

## 2016-05-23 LAB — COMPREHENSIVE METABOLIC PANEL
ALK PHOS: 50 U/L (ref 38–126)
ALT: 8 U/L — ABNORMAL LOW (ref 17–63)
ANION GAP: 15 (ref 5–15)
AST: 21 U/L (ref 15–41)
Albumin: 3 g/dL — ABNORMAL LOW (ref 3.5–5.0)
BILIRUBIN TOTAL: 1.2 mg/dL (ref 0.3–1.2)
BUN: 24 mg/dL — ABNORMAL HIGH (ref 6–20)
CO2: 36 mmol/L — ABNORMAL HIGH (ref 22–32)
CREATININE: 1.23 mg/dL (ref 0.61–1.24)
Calcium: 8.3 mg/dL — ABNORMAL LOW (ref 8.9–10.3)
Chloride: 74 mmol/L — ABNORMAL LOW (ref 101–111)
GLUCOSE: 101 mg/dL — AB (ref 65–99)
Potassium: 2.1 mmol/L — CL (ref 3.5–5.1)
Sodium: 125 mmol/L — ABNORMAL LOW (ref 135–145)
Total Protein: 7.5 g/dL (ref 6.5–8.1)

## 2016-05-23 LAB — URINALYSIS, COMPLETE (UACMP) WITH MICROSCOPIC
BILIRUBIN URINE: NEGATIVE
Glucose, UA: NEGATIVE mg/dL
Ketones, ur: NEGATIVE mg/dL
Leukocytes, UA: NEGATIVE
NITRITE: NEGATIVE
PH: 6 (ref 5.0–8.0)
Protein, ur: 30 mg/dL — AB
SPECIFIC GRAVITY, URINE: 1.008 (ref 1.005–1.030)
Squamous Epithelial / LPF: NONE SEEN

## 2016-05-23 LAB — MAGNESIUM: Magnesium: 1.4 mg/dL — ABNORMAL LOW (ref 1.7–2.4)

## 2016-05-23 LAB — BRAIN NATRIURETIC PEPTIDE: B NATRIURETIC PEPTIDE 5: 51 pg/mL (ref 0.0–100.0)

## 2016-05-23 LAB — GLUCOSE, CAPILLARY: Glucose-Capillary: 98 mg/dL (ref 65–99)

## 2016-05-23 LAB — INFLUENZA PANEL BY PCR (TYPE A & B)
INFLAPCR: NEGATIVE
Influenza B By PCR: NEGATIVE

## 2016-05-23 LAB — LIPASE, BLOOD: Lipase: 10 U/L — ABNORMAL LOW (ref 11–51)

## 2016-05-23 LAB — MRSA PCR SCREENING: MRSA by PCR: NEGATIVE

## 2016-05-23 LAB — TROPONIN I: Troponin I: 0.03 ng/mL (ref <0.03)

## 2016-05-23 MED ORDER — POTASSIUM CHLORIDE IN NACL 20-0.9 MEQ/L-% IV SOLN
INTRAVENOUS | Status: AC
  Filled 2016-05-23: qty 1000

## 2016-05-23 MED ORDER — POLYETHYLENE GLYCOL 3350 17 G PO PACK: 17.0000 g | PACK | Freq: Every day | ORAL | Status: DC | PRN

## 2016-05-23 MED ORDER — IOPAMIDOL (ISOVUE-300) INJECTION 61%
15.0000 mL | INTRAVENOUS | Status: AC
  Administered 2016-05-23 (×2): 15 mL via ORAL

## 2016-05-23 MED ORDER — SODIUM CHLORIDE 0.9 % IV BOLUS (SEPSIS)
250.0000 mL | Freq: Once | INTRAVENOUS | Status: AC
  Administered 2016-05-23: 250 mL via INTRAVENOUS

## 2016-05-23 MED ORDER — SODIUM CHLORIDE 0.9 % IV BOLUS (SEPSIS)
500.0000 mL | Freq: Once | INTRAVENOUS | Status: AC
  Administered 2016-05-23: 500 mL via INTRAVENOUS

## 2016-05-23 MED ORDER — GABAPENTIN 100 MG PO CAPS
200.0000 mg | ORAL_CAPSULE | Freq: Three times a day (TID) | ORAL | Status: DC
  Administered 2016-05-23 – 2016-05-28 (×14): 200 mg via ORAL
  Filled 2016-05-23 (×14): qty 2

## 2016-05-23 MED ORDER — FENTANYL 50 MCG/HR TD PT72
100.0000 ug | MEDICATED_PATCH | TRANSDERMAL | Status: DC
  Administered 2016-05-24 – 2016-05-26 (×2): 100 ug via TRANSDERMAL
  Filled 2016-05-23: qty 2
  Filled 2016-05-23: qty 1

## 2016-05-23 MED ORDER — FLEET ENEMA 7-19 GM/118ML RE ENEM: 1.0000 | ENEMA | Freq: Once | RECTAL | Status: DC | PRN

## 2016-05-23 MED ORDER — ACETAMINOPHEN 650 MG RE SUPP: 650.0000 mg | Freq: Four times a day (QID) | RECTAL | Status: DC | PRN

## 2016-05-23 MED ORDER — VANCOMYCIN HCL IN DEXTROSE 1-5 GM/200ML-% IV SOLN
1000.0000 mg | Freq: Once | INTRAVENOUS | Status: AC
  Administered 2016-05-23: 1000 mg via INTRAVENOUS
  Filled 2016-05-23: qty 200

## 2016-05-23 MED ORDER — ONDANSETRON HCL 4 MG/2ML IJ SOLN
4.0000 mg | Freq: Once | INTRAMUSCULAR | Status: AC
  Administered 2016-05-23: 4 mg via INTRAVENOUS
  Filled 2016-05-23: qty 2

## 2016-05-23 MED ORDER — ONDANSETRON HCL 4 MG/2ML IJ SOLN: 4.0000 mg | Freq: Four times a day (QID) | INTRAMUSCULAR | Status: DC | PRN

## 2016-05-23 MED ORDER — BISACODYL 10 MG RE SUPP: 10.0000 mg | Freq: Every day | RECTAL | Status: DC | PRN

## 2016-05-23 MED ORDER — POTASSIUM CHLORIDE 10 MEQ/100ML IV SOLN
10.0000 meq | INTRAVENOUS | Status: AC
  Administered 2016-05-23 – 2016-05-24 (×3): 10 meq via INTRAVENOUS
  Filled 2016-05-23 (×3): qty 100

## 2016-05-23 MED ORDER — ACETAMINOPHEN 325 MG PO TABS: 650.0000 mg | ORAL_TABLET | Freq: Four times a day (QID) | ORAL | Status: DC | PRN

## 2016-05-23 MED ORDER — CARVEDILOL 6.25 MG PO TABS
3.1250 mg | ORAL_TABLET | Freq: Two times a day (BID) | ORAL | Status: DC
  Administered 2016-05-24: 3.125 mg via ORAL
  Filled 2016-05-23: qty 1

## 2016-05-23 MED ORDER — IPRATROPIUM-ALBUTEROL 0.5-2.5 (3) MG/3ML IN SOLN
3.0000 mL | RESPIRATORY_TRACT | Status: DC
  Administered 2016-05-23 – 2016-05-24 (×4): 3 mL via RESPIRATORY_TRACT
  Filled 2016-05-23 (×4): qty 3

## 2016-05-23 MED ORDER — PIPERACILLIN-TAZOBACTAM 3.375 G IVPB
3.3750 g | Freq: Three times a day (TID) | INTRAVENOUS | Status: DC
  Administered 2016-05-23 – 2016-05-27 (×11): 3.375 g via INTRAVENOUS
  Filled 2016-05-23 (×14): qty 50

## 2016-05-23 MED ORDER — ALBUTEROL SULFATE (2.5 MG/3ML) 0.083% IN NEBU
2.5000 mg | INHALATION_SOLUTION | RESPIRATORY_TRACT | Status: DC | PRN
Start: 2016-05-23 — End: 2016-05-25

## 2016-05-23 MED ORDER — ENOXAPARIN SODIUM 40 MG/0.4ML ~~LOC~~ SOLN
40.0000 mg | SUBCUTANEOUS | Status: DC
Start: 2016-05-23 — End: 2016-05-28
  Administered 2016-05-23 – 2016-05-27 (×4): 40 mg via SUBCUTANEOUS
  Filled 2016-05-23 (×4): qty 0.4

## 2016-05-23 MED ORDER — SODIUM CHLORIDE 0.9 % IV BOLUS (SEPSIS)
1000.0000 mL | Freq: Once | INTRAVENOUS | Status: AC
  Administered 2016-05-23: 1000 mL via INTRAVENOUS

## 2016-05-23 MED ORDER — SODIUM CHLORIDE 0.9 % IV SOLN: 30.0000 meq | INTRAVENOUS | Status: DC

## 2016-05-23 MED ORDER — IOPAMIDOL (ISOVUE-300) INJECTION 61%
100.0000 mL | Freq: Once | INTRAVENOUS | Status: AC | PRN
  Administered 2016-05-23: 100 mL via INTRAVENOUS

## 2016-05-23 MED ORDER — PIPERACILLIN-TAZOBACTAM 3.375 G IVPB 30 MIN
3.3750 g | Freq: Once | INTRAVENOUS | Status: AC
  Administered 2016-05-23: 3.375 g via INTRAVENOUS
  Filled 2016-05-23: qty 50

## 2016-05-23 MED ORDER — ONDANSETRON HCL 4 MG PO TABS
4.0000 mg | ORAL_TABLET | Freq: Four times a day (QID) | ORAL | Status: DC | PRN
Start: 2016-05-23 — End: 2016-05-28

## 2016-05-23 MED ORDER — POTASSIUM CHLORIDE IN NACL 20-0.9 MEQ/L-% IV SOLN
INTRAVENOUS | Status: DC
  Administered 2016-05-23 – 2016-05-26 (×2): via INTRAVENOUS
  Filled 2016-05-23 (×6): qty 1000

## 2016-05-23 MED ORDER — SODIUM CHLORIDE 0.9 % IV SOLN
Freq: Once | INTRAVENOUS | Status: AC
  Administered 2016-05-23: 16:00:00 via INTRAVENOUS
  Filled 2016-05-23: qty 1000

## 2016-05-23 MED ORDER — HYDROMORPHONE HCL 2 MG PO TABS
2.0000 mg | ORAL_TABLET | ORAL | Status: DC | PRN
  Administered 2016-05-24 – 2016-05-26 (×10): 2 mg via ORAL
  Filled 2016-05-23 (×10): qty 1

## 2016-05-23 MED ORDER — ALPRAZOLAM 0.5 MG PO TABS
0.5000 mg | ORAL_TABLET | Freq: Three times a day (TID) | ORAL | Status: DC | PRN
  Administered 2016-05-24 – 2016-05-26 (×8): 0.5 mg via ORAL
  Filled 2016-05-23 (×8): qty 1

## 2016-05-23 MED ORDER — METHYLPREDNISOLONE SODIUM SUCC 125 MG IJ SOLR
60.0000 mg | Freq: Two times a day (BID) | INTRAMUSCULAR | Status: DC
  Administered 2016-05-23 – 2016-05-24 (×2): 60 mg via INTRAVENOUS
  Filled 2016-05-23 (×2): qty 2

## 2016-05-23 MED ORDER — DULOXETINE HCL 30 MG PO CPEP
30.0000 mg | ORAL_CAPSULE | Freq: Every day | ORAL | Status: DC
  Administered 2016-05-24 – 2016-05-28 (×5): 30 mg via ORAL
  Filled 2016-05-23 (×5): qty 1

## 2016-05-23 NOTE — Telephone Encounter (Signed)
Patient called also requesting refill of Fentanyl patches

## 2016-05-23 NOTE — ED Notes (Signed)
Ultrasound at bedside

## 2016-05-23 NOTE — ED Notes (Signed)
Spoke with ICU.  They will call me once bed is ready.  Discussed with them that I will take pt to CT on the way up.  Pt is resting.  Tolerating bipap at this time.

## 2016-05-23 NOTE — ED Notes (Signed)
Admitting MD has been in to see pt.

## 2016-05-23 NOTE — ED Notes (Addendum)
Pt placed on NRB due to being 85% on 4L [place].    Pt family reports pt having confusion the last few days, mild fever, cough with green production, diarrhea.   Pt alert and oriented to place, situation and person. Pt believes that it is March of 2018

## 2016-05-23 NOTE — ED Notes (Signed)
ED Provider at bedside. 

## 2016-05-23 NOTE — Consult Note (Signed)
Name: [name] MRN: [id number] DOB: 1960/11/03    ADMISSION DATE:  05/23/2016 CONSULTATION DATE: 05/23/16  REFERRING MD :  Dr. Darvin Neighbours  CHIEF COMPLAINT:  N/V, Confusion and increased shortness of breath  BRIEF PATIENT DESCRIPTION: 56 yo male with known history of diastolic Heart failure, COPD and multiple myeloma presenting with complaints of nausea/vomiting and confusion and increasaed shortness of breath with concerns of possible aspiration/PE/copd exacerbation  SIGNIFICANT EVENTS  5/1 Patient admitted to the ICU with increased sob, On NRB.  STUDIES:   5/1 CT Abdomen Pelvis>>No evidence for a bowel obstruction. Large amount of stool throughout the colon. Normal appendix2. Heterogenous appearance of the skeleton with multiple lucent lesions consistent with history of myeloma. Chronic compressiondeformities of T12, L1 and L2.  Mild nodular densities within the posterior lung bases andlingula suggestive of respiratory infection, to include atypical organisms  HISTORY OF PRESENT ILLNESS:  [name] is a 56 year old male with known history of Multiple Myeloma, CHF,COPD- 3L at baseline, Tobacco abuse, CAD, Dysrhythmias,GERD and MI.  Patient presents to ED on 5/1 with vomiting, Altered Mental status  And increased shortness of breath.  Patient was initially placed on BiPAP but as the patient was not able to tolerate BiPAP, he was placed on NRB.  PCCM was consulted for further management.  PAST MEDICAL HISTORY :   has a past medical history of Anxiety; Atrial septal defect; CHF (congestive heart failure) ([place]); COPD (chronic obstructive pulmonary disease) ([place]); Coronary artery disease; Dysrhythmias; GERD (gastroesophageal reflux disease); Hyperlipidemia; Multiple myeloma ([place]); Multiple myeloma ([place]); Myocardial infarction [facility]); and Substance abuse.  has no past surgical history on file. Prior to Admission medications   Medication Sig Start Date End Date Taking? Authorizing Provider    albuterol (PROVENTIL HFA;VENTOLIN HFA) 108 (90 BASE) MCG/ACT inhaler Inhale 2 puffs into the lungs every 6 (six) hours as needed for wheezing or shortness of breath.   Yes Historical Provider, MD  ALPRAZolam Duanne Moron) 0.5 MG tablet Take 1 tablet (0.5 mg total) by mouth 3 (three) times daily as needed for anxiety. 05/11/16  Yes Cammie Sickle, MD  aspirin 325 MG EC tablet Take 325 mg by mouth daily.   Yes Historical Provider, MD  carvedilol (COREG) 3.125 MG tablet Take 3.125 mg by mouth 2 (two) times daily with a meal.   Yes Historical Provider, MD  DULoxetine (CYMBALTA) 30 MG capsule TAKE ONE CAPSULE BY MOUTH EVERY DAY 03/23/16  Yes Cammie Sickle, MD  fentaNYL (DURAGESIC - DOSED MCG/HR) 100 MCG/HR Place 1 patch (100 mcg total) onto the skin every other day. 04/21/16  Yes Cammie Sickle, MD  furosemide (LASIX) 40 MG tablet Take 40 mg by mouth daily. 04/10/16  Yes Historical Provider, MD  gabapentin (NEURONTIN) 100 MG capsule Take 200 mg by mouth 3 (three) times daily.    Yes Historical Provider, MD  HYDROmorphone (DILAUDID) 2 MG tablet Take 1 tablet (2 mg total) by mouth every 4 (four) hours as needed for severe pain. 05/11/16  Yes Cammie Sickle, MD  ipratropium-albuterol (DUONEB) 0.5-2.5 (3) MG/3ML SOLN Take 3 mLs by nebulization every 4 (four) hours as needed. 01/26/16  Yes Cammie Sickle, MD  lenalidomide (REVLIMID) 25 MG capsule Take 1 capsule (25 mg total) by mouth daily. For 21 days then off 7 days 05/19/16  Yes Cammie Sickle, MD  potassium chloride SA (K-DUR,KLOR-CON) 20 MEQ tablet Take one tablet 3 times daily for the first week.  Take one tablet daily after  first week. 04/05/16  Yes Cammie Sickle, MD  promethazine (PHENERGAN) 25 MG tablet TAKE ONE TABLET BY MOUTH EVERY 4 HOURS AS NEEDED FOR NAUSEA 05/04/16  Yes Cammie Sickle, MD  tiotropium (SPIRIVA) 18 MCG inhalation capsule Place 1 capsule (18 mcg total) into inhaler and inhale daily. Patient not  taking: Reported on 05/23/2016 12/27/14   Fritzi Mandes, MD   No Known Allergies  FAMILY HISTORY:  family history includes CAD in his mother; COPD in his mother; Heart attack in his father. SOCIAL HISTORY:  reports that he has been smoking Cigarettes.  He has a 20.00 pack-year smoking history. He has never used smokeless tobacco. He reports that he does not drink alcohol or use drugs.  REVIEW OF SYSTEMS:   Constitutional: Negative for fever, chills, weight loss, malaise/fatigue and diaphoresis.  HENT: Negative for hearing loss, ear pain, nosebleeds, congestion, sore throat, neck pain, tinnitus and ear discharge.   Eyes: Negative for blurred vision, double vision, photophobia, pain, discharge and redness.  Respiratory: Negative for cough, hemoptysis, sputum production, shortness of breath, wheezing and stridor.   Cardiovascular: Negative for chest pain, palpitations, orthopnea, claudication, leg swelling and PND.  Gastrointestinal: Negative for heartburn, nausea, vomiting, abdominal pain, diarrhea, constipation, blood in stool and melena.  Genitourinary: Negative for dysuria, urgency, frequency, hematuria and flank pain.  Musculoskeletal: Negative for myalgias, back pain, joint pain and falls.  Skin: Negative for itching and rash.  Neurological: Negative for dizziness, tingling, tremors, sensory change, speech change, focal weakness, seizures, loss of consciousness, weakness and headaches.  Endo/Heme/Allergies: Negative for environmental allergies and polydipsia. Does not bruise/bleed easily.  SUBJECTIVE: "Patient states that he is not short of breath and is feeling ok."  VITAL SIGNS: Temp:  [98 F (36.7 C)-98.6 F (37 C)] 98.6 F (37 C) (05/01 1931) Pulse Rate:  [86-126] 93 (05/01 2100) Resp:  [13-27] 13 (05/01 2100) BP: (81-150)/(55-118) 81/61 (05/01 2100) SpO2:  [71 %-95 %] 93 % (05/01 2100) Weight:  [119 lb 0.8 oz (54 kg)-120 lb (54.4 kg)] 119 lb 0.8 oz (54 kg) (05/01 1357)  PHYSICAL  EXAMINATION: General: Middle aged male,On NRB, in no acute distress Neuro:  Awake, Alert and oriented HEENT:  AT,[place],No jvd Cardiovascular:  S1S2,R, no MRG Lungs: Clear bilaterally, no wheezes, crackles or rhonchi noted Abdomen:  Hypoactive bowel sounds, NT,firm Musculoskeletal: No edema/cyanosis noted Skin:  Warm,dry and Intact   Recent Labs Lab 05/23/16 1337  NA 125*  K 2.1*  CL 74*  CO2 36*  BUN 24*  CREATININE 1.23  GLUCOSE 101*    Recent Labs Lab 05/23/16 1337  HGB 10.8*  HCT 30.6*  WBC 2.3*  PLT 234   Ct Abdomen Pelvis W Contrast  Result Date: 05/23/2016 CLINICAL DATA:  Daily vomiting epigastric and right upper quadrant pain EXAM: CT ABDOMEN AND PELVIS WITH CONTRAST TECHNIQUE: Multidetector CT imaging of the abdomen and pelvis was performed using the standard protocol following bolus administration of intravenous contrast. CONTRAST:  143m ISOVUE-300 IOPAMIDOL (ISOVUE-300) INJECTION 61% COMPARISON:  Head CT 05/24/2015, CT abdomen pelvis 09/24/2013, ultrasound abdomen 05/01/ 2018 FINDINGS: Lower chest: Respiratory motion artifact. Mildly nodular densities are visualized within the bilateral lung bases, right greater than left and to a lesser extent within the lingula. No pleural effusion. Partially visualized cardiac pacing leads. The heart does not appear enlarged. Hepatobiliary: Subcentimeter hypodense liver lesions, grossly stable, too small to further characterize. No calcified gallstones. No biliary dilatation. Pancreas: Unremarkable. No surrounding inflammatory changes. Prominence of the pancreatic duct at  the head and neck does not appear significantly changed. Spleen: Normal in size without focal abnormality. Adrenals/Urinary Tract: Subcentimeter hypodense lesion lower pole left kidney too small to further characterize. Adrenal glands within normal limits. No hydronephrosis. Distended bladder Stomach/Bowel: Stomach is within normal limits. Appendix appears normal. No  evidence of bowel wall thickening, distention, or inflammatory changes. Large amount of stool throughout the colon. Vascular/Lymphatic: Aortic atherosclerosis. No enlarged abdominal or pelvic lymph nodes. Reproductive: No masses Other: No free air or free fluid. Musculoskeletal: Heterogenous attenuation with innumerable lucent lesions in the spine ribs pelvis and proximal femurs. Severe compression deformities of T12 and L2 do not appear significantly changed compared with 2017 CT. Superior endplate compression fracture of L1, also unchanged. IMPRESSION: 1. No evidence for a bowel obstruction. Large amount of stool throughout the colon. Normal appendix. 2. Heterogenous appearance of the skeleton with multiple lucent lesions consistent with history of myeloma. Chronic compression deformities of T12, L1 and L2. 3. Mild nodular densities within the posterior lung bases and lingula suggestive of respiratory infection, to include atypical organisms. Electronically Signed   By: Donavan Foil M.D.   On: 05/23/2016 18:43   Dg Chest Portable 1 View  Result Date: 05/23/2016 CLINICAL DATA:  Vomiting for 4 days EXAM: PORTABLE CHEST 1 VIEW COMPARISON:  12/23/2014 FINDINGS: The sella There is no focal parenchymal opacity. There is no pleural effusion or pneumothorax. The heart and mediastinal contours are unremarkable. Single lead pacemaker. The osseous structures are unremarkable. IMPRESSION: No active disease. Electronically Signed   By: Kathreen Devoid   On: 05/23/2016 14:29   US Abdomen Limited Ruq  Result Date: 05/23/2016 CLINICAL DATA:  Right upper quadrant pain EXAM: US ABDOMEN LIMITED - RIGHT UPPER QUADRANT COMPARISON:  None. FINDINGS: Gallbladder: No gallstones or wall thickening visualized. No sonographic Murphy sign noted by sonographer. Common bile duct: Diameter: 5 mm Liver: No focal lesion identified. Within normal limits in parenchymal echogenicity. IMPRESSION: No acute abnormality noted. Electronically Signed    By: Inez Catalina M.D.   On: 05/23/2016 14:59    ASSESSMENT / PLAN:   Acute on Chronic Respiratory failure possibly related to COPD Exacerbation Acute Encephalopathy possibly due to hyponatremia- Resolved History Of CHF with EF<15% Current Tobacco Abuse Aspiration Pneumonia highly unlikely  PE highly unlikely Multiple Myeloma adynamic ileus.  Severe Osteopenia  Possibly secondary to osteoporosis/osteomalacia Hyponatremia Hypokalemia Acute Kidney Injury possibly due to dehydration Nausea/Vomitting   Plan Continue to Support with NRB/BiPAP, wean as tolerated Replace electrolytes per guidelines Tobacco Cessation Counseling Antibiotics per primary Bronchodilators Steroids, Taper Continue Zofran Undergoing treatment at [facility] with Dr. Rogue Bussing Miralax/Dulcolax for constipation    Bincy Varughese,AG-ACNP Pulmonary and [place]   05/23/2016, 9:42 PM   PCCM ATTENDING ATTESTATION:  I have evaluated patient with the APP Varughese, reviewed database in its entirety and discussed care plan in detail. In addition, this patient was discussed on multidisciplinary rounds.   Important exam findings: Frail and chronically ill appearing Norepinephrine at 3 mcg/min No overt respiratory distress on nasal cannula oxygen No wheezes or other adventitious sounds Regular with no murmurs Abdomen soft Extremities warm without edema  Chest x-ray: No acute findings  Major problems addressed by PCCM team: Acute on chronic respiratory failure Hypotension - suspect multifactorial including hypovolemia and cardiogenic History of dilated cardiomyopathy Multiple myeloma Neutropenia Elevated pro-calcitonin Hyponatremia Hypokalemia Contraction alkalosis  PLAN/REC: Continue volume resuscitation Continue empiric antibiotics Change steroids to hydrocortisone for possible adrenal insufficiency Wean norepinephrine to off  for MAP greater than 60  mmHg Will hold off on CT angiogram of chest for now as PE seems unlikely   Merton Border, MD PCCM service Mobile [phone] Pager [phone] 05/24/2016 2:45 PM

## 2016-05-23 NOTE — H&P (Signed)
[place] [facility] [place] NAME: [name]    MR#:  [id number]  DATE OF BIRTH:  November 23, 1960  DATE OF ADMISSION:  05/23/2016  PRIMARY CARE PHYSICIAN: Petra Kuba, MD   REQUESTING/REFERRING PHYSICIAN: Dr. Mariea Clonts  CHIEF COMPLAINT:   Chief Complaint  Patient presents with  . Emesis    HISTORY OF PRESENT ILLNESS:  [name]  is a 56 y.o. male with a known history of Multiple myeloma, CHF, hyperlipidemia, COPD on 3 days home oxygen presents to the emergency room brought in by family due to intractable vomiting, constipation, altered mental status. He has also noticed some wheezing for 2 weeks. Patient follows at [facility] for his multiple myeloma. He is on narcotic pain medications for chronic pain. In the emergency room patient has been found to have hyponatremia, hypokalemia. Sepsis. Chest x-ray clear. CT scan of the abdomen showing changes from multiple myeloma and significant stool burden.   PAST MEDICAL HISTORY:   Past Medical History:  Diagnosis Date  . Anxiety   . Atrial septal defect   . CHF (congestive heart failure) ([place])   . COPD (chronic obstructive pulmonary disease) ([place])   . Coronary artery disease   . Dysrhythmias   . GERD (gastroesophageal reflux disease)   . Hyperlipidemia   . Multiple myeloma ([place])   . Multiple myeloma ([place])   . Myocardial infarction ([place])   . Substance abuse     PAST SURGICAL HISTORY:  History reviewed. No pertinent surgical history.  SOCIAL HISTORY:   Social History  Substance Use Topics  . Smoking status: Current Every Day Smoker    Packs/day: 0.50    Years: 40.00    Types: Cigarettes  . Smokeless tobacco: Never Used  . Alcohol use No    FAMILY HISTORY:   Family History  Problem Relation Age of Onset  . COPD Mother   . CAD Mother   . Heart attack Father     DRUG ALLERGIES:  No Known Allergies  REVIEW OF SYSTEMS:   ROS  MEDICATIONS AT HOME:   Prior to Admission  medications   Medication Sig Start Date End Date Taking? Authorizing Provider  albuterol (PROVENTIL HFA;VENTOLIN HFA) 108 (90 BASE) MCG/ACT inhaler Inhale 2 puffs into the lungs every 6 (six) hours as needed for wheezing or shortness of breath.   Yes Historical Provider, MD  ALPRAZolam Duanne Moron) 0.5 MG tablet Take 1 tablet (0.5 mg total) by mouth 3 (three) times daily as needed for anxiety. 05/11/16  Yes Cammie Sickle, MD  aspirin 325 MG EC tablet Take 325 mg by mouth daily.   Yes Historical Provider, MD  carvedilol (COREG) 3.125 MG tablet Take 3.125 mg by mouth 2 (two) times daily with a meal.   Yes Historical Provider, MD  DULoxetine (CYMBALTA) 30 MG capsule TAKE ONE CAPSULE BY MOUTH EVERY DAY 03/23/16  Yes Cammie Sickle, MD  fentaNYL (DURAGESIC - DOSED MCG/HR) 100 MCG/HR Place 1 patch (100 mcg total) onto the skin every other day. 04/21/16  Yes Cammie Sickle, MD  furosemide (LASIX) 40 MG tablet Take 40 mg by mouth daily. 04/10/16  Yes Historical Provider, MD  gabapentin (NEURONTIN) 100 MG capsule Take 200 mg by mouth 3 (three) times daily.    Yes Historical Provider, MD  HYDROmorphone (DILAUDID) 2 MG tablet Take 1 tablet (2 mg total) by mouth every 4 (four) hours as needed for severe pain. 05/11/16  Yes Cammie Sickle, MD  ipratropium-albuterol (DUONEB)  0.5-2.5 (3) MG/3ML SOLN Take 3 mLs by nebulization every 4 (four) hours as needed. 01/26/16  Yes Cammie Sickle, MD  lenalidomide (REVLIMID) 25 MG capsule Take 1 capsule (25 mg total) by mouth daily. For 21 days then off 7 days 05/19/16  Yes Cammie Sickle, MD  potassium chloride SA (K-DUR,KLOR-CON) 20 MEQ tablet Take one tablet 3 times daily for the first week.  Take one tablet daily after first week. 04/05/16  Yes Cammie Sickle, MD  promethazine (PHENERGAN) 25 MG tablet TAKE ONE TABLET BY MOUTH EVERY 4 HOURS AS NEEDED FOR NAUSEA 05/04/16  Yes Cammie Sickle, MD  tiotropium (SPIRIVA) 18 MCG inhalation capsule  Place 1 capsule (18 mcg total) into inhaler and inhale daily. Patient not taking: Reported on 05/23/2016 12/27/14   Fritzi Mandes, MD     VITAL SIGNS:  Blood pressure 105/73, pulse (!) 122, temperature 98 F (36.7 C), temperature source Oral, resp. rate (!) 27, height 6' 1"  (1.854 m), weight 54 kg (119 lb 0.8 oz), SpO2 93 %.  PHYSICAL EXAMINATION:  Physical Exam  GENERAL:  56 y.o.-year-old patient lying in the bed with Respiratory distress. Looks critically ill.  EYES: Pupils equal, round, reactive to light and accommodation. No scleral icterus. Extraocular muscles intact.  HEENT: Head atraumatic, normocephalic. Oropharynx and nasopharynx clear. No oropharyngeal erythema, moist oralDry  NECK:  Supple, no jugular venous distention. No thyroid enlargement, no tenderness.  LUNGS: Bilateral wheezing and decreased air entry  CARDIOVASCULAR: S1, S2 normal. No murmurs, rubs, or gallops. Tachycardia  ABDOMEN: Soft, nontender, nondistended. Bowel sounds present. No organomegaly or mass.  EXTREMITIES: No pedal edema, cyanosis, or clubbing. + 2 pedal & radial pulses b/l.   NEUROLOGIC: Cranial nerves II through XII are intact. No focal Motor or sensory deficits appreciated b/l PSYCHIATRIC: The patient is Drowsy  SKIN: No obvious rash, lesion, or ulcer.   LABORATORY PANEL:   CBC  Recent Labs Lab 05/23/16 1337  WBC 2.3*  HGB 10.8*  HCT 30.6*  PLT 234   ------------------------------------------------------------------------------------------------------------------  Chemistries   Recent Labs Lab 05/23/16 1337  NA 125*  K 2.1*  CL 74*  CO2 36*  GLUCOSE 101*  BUN 24*  CREATININE 1.23  CALCIUM 8.3*  AST 21  ALT 8*  ALKPHOS 50  BILITOT 1.2   ------------------------------------------------------------------------------------------------------------------  Cardiac Enzymes  Recent Labs Lab 05/23/16 1337  TROPONINI 0.03*    ------------------------------------------------------------------------------------------------------------------  RADIOLOGY:  Ct Abdomen Pelvis W Contrast  Result Date: 05/23/2016 CLINICAL DATA:  Daily vomiting epigastric and right upper quadrant pain EXAM: CT ABDOMEN AND PELVIS WITH CONTRAST TECHNIQUE: Multidetector CT imaging of the abdomen and pelvis was performed using the standard protocol following bolus administration of intravenous contrast. CONTRAST:  137m ISOVUE-300 IOPAMIDOL (ISOVUE-300) INJECTION 61% COMPARISON:  Head CT 05/24/2015, CT abdomen pelvis 09/24/2013, ultrasound abdomen 05/01/ 2018 FINDINGS: Lower chest: Respiratory motion artifact. Mildly nodular densities are visualized within the bilateral lung bases, right greater than left and to a lesser extent within the lingula. No pleural effusion. Partially visualized cardiac pacing leads. The heart does not appear enlarged. Hepatobiliary: Subcentimeter hypodense liver lesions, grossly stable, too small to further characterize. No calcified gallstones. No biliary dilatation. Pancreas: Unremarkable. No surrounding inflammatory changes. Prominence of the pancreatic duct at the head and neck does not appear significantly changed. Spleen: Normal in size without focal abnormality. Adrenals/Urinary Tract: Subcentimeter hypodense lesion lower pole left kidney too small to further characterize. Adrenal glands within normal limits. No hydronephrosis. Distended bladder Stomach/Bowel: Stomach is  within normal limits. Appendix appears normal. No evidence of bowel wall thickening, distention, or inflammatory changes. Large amount of stool throughout the colon. Vascular/Lymphatic: Aortic atherosclerosis. No enlarged abdominal or pelvic lymph nodes. Reproductive: No masses Other: No free air or free fluid. Musculoskeletal: Heterogenous attenuation with innumerable lucent lesions in the spine ribs pelvis and proximal femurs. Severe compression deformities  of T12 and L2 do not appear significantly changed compared with 2017 CT. Superior endplate compression fracture of L1, also unchanged. IMPRESSION: 1. No evidence for a bowel obstruction. Large amount of stool throughout the colon. Normal appendix. 2. Heterogenous appearance of the skeleton with multiple lucent lesions consistent with history of myeloma. Chronic compression deformities of T12, L1 and L2. 3. Mild nodular densities within the posterior lung bases and lingula suggestive of respiratory infection, to include atypical organisms. Electronically Signed   By: Donavan Foil M.D.   On: 05/23/2016 18:43   Dg Chest Portable 1 View  Result Date: 05/23/2016 CLINICAL DATA:  Vomiting for 4 days EXAM: PORTABLE CHEST 1 VIEW COMPARISON:  12/23/2014 FINDINGS: The sella There is no focal parenchymal opacity. There is no pleural effusion or pneumothorax. The heart and mediastinal contours are unremarkable. Single lead pacemaker. The osseous structures are unremarkable. IMPRESSION: No active disease. Electronically Signed   By: Kathreen Devoid   On: 05/23/2016 14:29   US Abdomen Limited Ruq  Result Date: 05/23/2016 CLINICAL DATA:  Right upper quadrant pain EXAM: US ABDOMEN LIMITED - RIGHT UPPER QUADRANT COMPARISON:  None. FINDINGS: Gallbladder: No gallstones or wall thickening visualized. No sonographic Murphy sign noted by sonographer. Common bile duct: Diameter: 5 mm Liver: No focal lesion identified. Within normal limits in parenchymal echogenicity. IMPRESSION: No acute abnormality noted. Electronically Signed   By: Inez Catalina M.D.   On: 05/23/2016 14:59   IMPRESSION AND PLAN:   * Aspiration pneumonia with acute on chronic hypoxic respiratory failure and sepsis. Associated COPD exacerbation Chest x-ray looks clear but patient has been a vomiting. Also his breathing got worse after drinking contrast. Possible aspiration. -IV steroids, Antibiotics - Scheduled Nebulizers - Inhalers - BiPAP - Consult  pulmonary We will also check CTA of the chest to rule out pulmonary embolism Critically ill. Admit to stepdown area.  * Vomiting likely due to severe constipation. Will need suppository/enema once his breathing improves.  * Hyponatremia and hypokalemia due to severe dehydration and decreased oral intake. We'll replace aggressively through IV. Check magnesium level. Repeat labs in the morning.  * Acute encephalopathy likely due to hyponatremia. This is improving as per family at bedside.  * Multiple myeloma. Undergoing treatment at [facility] with Dr. Rogue Bussing.  * DVT prophylaxis with Lovenox   All the records are reviewed and case discussed with ED provider. Management plans discussed with the patient, family and they are in agreement.  CODE STATUS: DNR Discussed by Dr. Mariea Clonts in the emergency room today with patient with family at bedside.  TOTAL CC TIME TAKING CARE OF THIS PATIENT: 40 minutes.   Hillary Bow R M.D on 05/23/2016 at 7:16 PM  Between 7am to 6pm - Pager - [phone]  After 6pm go to [email or web address] - password [email or web address] [facility]  [facility]  Office  [phone]  CC: Primary care physician; Petra Kuba, MD  Note: This dictation was prepared with Dragon dictation along with smaller phrase technology. Any transcriptional errors that result from this process are unintentional.

## 2016-05-23 NOTE — ED Triage Notes (Signed)
Vomiting for last 4 days.  No diarrhea.  Started off with constipation and companion s ays he has not had bm in about 5 days.  And that he has had ams.

## 2016-05-23 NOTE — Progress Notes (Signed)
Increased patient to 40% due to sats in the mid 80's.

## 2016-05-23 NOTE — ED Notes (Addendum)
Pt back from CT. Pt oxygen saturation 84% on 8L [place]. Pt placed on NRB and RT called to place BIPAP back on patient. RT at bedside currently.

## 2016-05-23 NOTE — ED Notes (Signed)
Pt to CT

## 2016-05-23 NOTE — ED Notes (Signed)
Call to ICU to give report 

## 2016-05-23 NOTE — Progress Notes (Signed)
Pharmacy Antibiotic Note  [name] is a 56 y.o. male admitted on 05/23/2016 with aspiration PNA.  Pharmacy has been consulted for piperacillin/tazobactam dosing.  Plan: Piperacillin/tazobactam 3.375 g IV q8h EI  Height: 6\' 1"  (185.4 cm) Weight: 119 lb 0.8 oz (54 kg) IBW/kg (Calculated) : 79.9  Temp (24hrs), Avg:98 F (36.7 C), Min:98 F (36.7 C), Max:98 F (36.7 C)   Recent Labs Lab 05/23/16 1337  WBC 2.3*  CREATININE 1.23  LATICACIDVEN 1.8    Estimated Creatinine Clearance: 50.6 mL/min (by C-G formula based on SCr of 1.23 mg/dL).    No Known Allergies  Antimicrobials this admission: Piperacillin/tazobactam 5/1 >>   Dose adjustments this admission:  Microbiology results: 5/1 BCx: Sent 5/1 UCx: Sent   Thank you for allowing pharmacy to be a part of this patient's care.  Lenis Noon, PharmD, BCPS Clinical Pharmacist 05/23/2016 7:19 PM

## 2016-05-23 NOTE — ED Notes (Signed)
Pt is on Bipap.  Gave pt his IV steroid as ordered. Pt tells me that he wants the bipap off and states that he can not tolerate the amount of air being blown at him.  RT to the bedside.  RT briefly removed Bipap and pt spo2 dipped to 88% after a short amount of time.  RT placed pt back on bipap at a lower setting which pt seems to tolerate at this time.  Family remains at the bedside. Will transport pt to CTA on the way to the unit once bed is assigned.

## 2016-05-23 NOTE — Progress Notes (Signed)
250 NS bolus given to help improve patient BP. MAP remains in the 60s. Per [facility] NP MAP goal >60.

## 2016-05-23 NOTE — ED Notes (Signed)
Dr. Mariea Clonts aware of critical potassium and troponin. No further orders received.

## 2016-05-23 NOTE — ED Notes (Signed)
Called Code Sepsis to [facility] at 1400. Was informed by looking at the chart

## 2016-05-23 NOTE — ED Notes (Signed)
RT at bedside to place patient on BIPAP

## 2016-05-23 NOTE — ED Provider Notes (Signed)
[facility] Emergency Department Provider Note  ____________________________________________  Time seen: Approximately 1:53 PM  I have reviewed the triage vital signs and the nursing notes.   HISTORY  Chief Complaint Emesis    HPI [name] is a 56 y.o. male with a history of COPD and multiple myeloma on 3 L nasal cannula, CAD status post MI, ASD, CHF and cardiomyopathy, presenting with inability to keep down fluids and altered mental status. The patient is able to answer most questions appropriately, and his family with whom he lives is here to answer the additional questions. The patient reports that for the past 3 months, he has had daily vomiting, but for the last 3 days, he has been unable to tolerate any liquid or solid. In addition this patient has been having epigastric and right upper quadrant pain, as well as constipation with last bowel movement 5 days ago. He has not had dysuria, but his last urine was very dark. The patient wears oxygen at baseline, and came here without it on, with arriving pulse oximeter at 71%. The patient also reports a nonproductive cough without fevers or chills. He is feeling better now that he has supplemental oxygen.   Past Medical History:  Diagnosis Date  . Anxiety   . Atrial septal defect   . CHF (congestive heart failure) ([place])   . COPD (chronic obstructive pulmonary disease) ([place])   . Coronary artery disease   . Dysrhythmias   . GERD (gastroesophageal reflux disease)   . Hyperlipidemia   . Multiple myeloma ([place])   . Multiple myeloma ([place])   . Myocardial infarction ([place])   . Substance abuse     Patient Active Problem List   Diagnosis Date Noted  . Cough in adult 05/05/2016  . Multiple myeloma in relapse ([place]) 09/09/2015  . Congestive heart failure ([place]) 01/06/2015  . Cardiomyopathy ([place]) 01/06/2015  . Nonsustained ventricular tachycardia ([place]) 01/06/2015  . Malnutrition of moderate degree 12/24/2014  .  Pressure ulcer 12/24/2014  . Cellulitis of second finger of left hand   . Sepsis ([place]) 12/23/2014  . Multiple myeloma ([place]) 05/26/2014  . 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 Arteriosclerosis of coronary artery 08/22/2011  . Cardiac conduction disorder 08/22/2011  . Myocardial infarction ([place]) 08/22/2011  . Cardiac arrhythmia 08/22/2011  . Anxiety 08/21/2011  . Acid reflux 08/21/2011  . Chronic obstructive pulmonary disease ([place]) 07/26/2011  . Nicotine addiction 07/26/2011    History reviewed. No pertinent surgical history.  Current Outpatient Rx  . Order #: [id number] Class: Historical Med  . Order #: [id number] Class: Print  . Order #: [id number] Class: Historical Med  . Order #: [id number] Class: Historical Med  . Order #: [id number] Class: Historical Med  . Order #: [id number] Class: Normal  . Order #: [id number] Class: Print  . Order #: [id number] Class: Historical Med  . Order #: [id number] Class: Historical Med  . Order #: [id number] Class: Historical Med  . Order #: [id number] Class: Print  . Order #: [id number] Class: Historical Med  . Order #: [id number] Class: Normal  . Order #: [id number] Class: Normal  . Order #: [id number] Class: Normal  . Order #: [id number] Class: Normal  . Order #: [id number] Class: Normal    Allergies Patient has no known allergies.  Family History  Problem Relation Age of Onset  . COPD Mother   . CAD Mother   . Heart attack Father     Social History Social History  Substance Use Topics  . Smoking status: Current Every Day Smoker    Packs/day: 0.50  Years: 40.00    Types: Cigarettes  . Smokeless tobacco: Never Used  . Alcohol use No    Review of Systems Constitutional: No fever/chills.Positive generalized fatigue. Positive altered mental status. Eyes: No visual changes. No eye discharge. ENT: No sore throat. No congestion or rhinorrhea. No ear pain. Cardiovascular: Denies chest pain. Denies palpitations. Respiratory:  Positive shortness of breath.  Positive cough. Gastrointestinal: Positive epigastric and right upper quadrant abdominal pain.  Positive nausea, positive vomiting.  No diarrhea.  Positive constipation; last bowel movement 5 days ago. Genitourinary: Negative for dysuria. Positive for dark urine. Musculoskeletal: Negative for back pain. Skin: Negative for rash. Neurological: Negative for headaches. No focal numbness, tingling or weakness. Positive altered mental status.  10-point ROS otherwise negative.  ____________________________________________   PHYSICAL EXAM:  VITAL SIGNS: ED Triage Vitals  Enc Vitals Group     BP 05/23/16 1319 (!) 150/118     Pulse Rate 05/23/16 1319 (!) 126     Resp 05/23/16 1319 (!) 22     Temp 05/23/16 1319 98 F (36.7 C)     Temp Source 05/23/16 1319 Oral     SpO2 05/23/16 1319 (!) 71 %     Weight 05/23/16 1319 120 lb (54.4 kg)     Height 05/23/16 1319 6' 1"  (1.854 m)     Head Circumference --      Peak Flow --      Pain Score 05/23/16 1318 0     Pain Loc --      Pain Edu? --      Excl. in [place]? --     Constitutional: The patient is alert and able to answer most questions, but he initially thinks that it is March. [id number] and where he is. He is able to answer basic questions about his recent medical history. GCS is 15. He is chronically ill appearing but in significant respiratory distress, and has multiple signs of significant dehydration. Eyes: Conjunctivae are normal.  EOMI. No scleral icterus. Head: Atraumatic. Nose: No congestion/rhinnorhea. Mouth/Throat: Mucous membranes are dry.  Neck: No stridor.  Supple.  No meningismus. Cardiovascular: Fast rate, regular rhythm. No murmurs, rubs or gallops.  Respiratory: The patient has respiratory distress with significant tachypnea, accessory muscle use and retractions. On arrival, his O2 sats are 71% on room air, and with 4 L nasal cannula, I'm unable to get his oxygen saturations above 88%. On a full face  mask at 15 L, the patient has O2 sats in the mid 90s and his breathing is more comfortable. He has decreased breath sounds in the left lung base, rales diffusely in the lung fields bilaterally, and an expiratory wheezing with a prolonged expiratory phase. Gastrointestinal: Soft and nondistended.  Tender to palpation in the epigastrium and right upper quadrant with positive Murphy sign. No guarding or rebound.  No peritoneal signs. Musculoskeletal: No LE edema. No ttp in the calves or palpable cords.  Negative Homan's sign. Neurologic:  Alert.  Speech is clear.  Face and smile are symmetric.  EOMI.  Moves all extremities well. Skin:  Skin is warm, dry and intact. No rash noted. Poor skin turgor. Psychiatric: Mood and affect are normal.  ____________________________________________   LABS (all labs ordered are listed, but only abnormal results are displayed)  Labs Reviewed  CULTURE, BLOOD (ROUTINE X 2)  CULTURE, BLOOD (ROUTINE X 2)  URINE CULTURE  CBC  TROPONIN I  LACTIC ACID, PLASMA  LACTIC ACID, PLASMA  COMPREHENSIVE METABOLIC PANEL  URINALYSIS, COMPLETE (UACMP) WITH  MICROSCOPIC  LIPASE, BLOOD  BLOOD GAS, VENOUS  INFLUENZA PANEL BY PCR (TYPE A & B)   ____________________________________________  EKG  ED ECG REPORT I, Eula Listen, the attending physician, personally viewed and interpreted this ECG.   Date: 05/23/2016  EKG Time: 1320  Rate: 125  Rhythm: sinus tachycardia  Axis: normal  Intervals:prolonged QTc  ST&T Change: 55m of ST depression V3 and II; 281mST depression III; 55m655mT elevation aVR; RBBB. No STEMI; will need to repeat EKG once rate improves.  ____________________________________________  RADIOLOGY  No results found.  ____________________________________________   PROCEDURES  Procedure(s) performed: None  Procedures  Critical Care performed: Yes, see critical care note(s) ____________________________________________   INITIAL  IMPRESSION / ASSESSMENT AND PLAN / ED COURSE  Pertinent labs & imaging results that were available during my care of the patient were reviewed by me and considered in my medical decision making (see chart for details).  [age]o. male with COPD and multiple myeloma, significant CAD and CHF, presenting with altered mental status in the setting of inability to keep down any food or drink for the past 3 days, as well as hypoxia, cough.   This patient is toxic in appearnce and I am concerned about sepsis with both pulmonary and abdominal sources possible.  Vancomycin and Zosyn have been ordered. A code sepsis has been initiated. We'll get a CT scan of the abdomen, and while I am most concerned about his gallbladder, his constipation is also concerning for possible obstruction. In addition, pulmonary source such as pneumonia is possible and probable.  The patient does have some ST abnormalities in his EKG; however, it is unlikely that his primary problem today is ACS or MI. Given how sick he is with infectious symptoms, he would not be candidate for Catheterization at this time. We will stabilize him with BiPAP, fluids, and antibiotics, and reevaluate his EKG when his rate has slowed down. It is likely that the changes we are seeing are demand ischemia. His illness may also be causing an and STEMI.  The patient will require admission for further evaluation and treatment. I have had a long conversation with the patient as well as his wife and daughter, with whom he lives, and he is very clear that he would not wish to be resuscitated with intubation or CPR. We have discussed the other treatments, including antibiotics and BiPAP, that I plan to initiate in the emergency department and he has in agreement with those.  ----------------------------------------- 3:14 PM on 05/23/2016 -----------------------------------------  Clinically, the patient has significantly improved. His heart rate is now in the low  100s, his oxygenation is in the mid 90s on BiPAP. He is mentating more clearly than on arrival. He has not had any nausea or vomiting here. His chest x-ray does not show any acute infiltrate, and his gas does show some hypercarbia but normal oxygenation. He has a chronic anemia. His troponin is elevated, but again I do think this is demand ischemia. He is hyponatremic and hypokalemic, which is consistent with his nausea and vomiting with dehydration. His ultrasound did not show any gallbladder pathology, and I have ordered a CT for further evaluation.  CRITICAL CARE Performed by: NorEula ListenTotal critical care time: 45 minutes  Critical care time was exclusive of separately billable procedures and treating other patients.  Critical care was necessary to treat or prevent imminent or life-threatening deterioration.  Critical care was time spent personally by me on the following activities: development  of treatment plan with patient and/or surrogate as well as nursing, discussions with consultants, evaluation of patient's response to treatment, examination of patient, obtaining history from patient or surrogate, ordering and performing treatments and interventions, ordering and review of laboratory studies, ordering and review of radiographic studies, pulse oximetry and re-evaluation of patient's condition.   ____________________________________________  FINAL CLINICAL IMPRESSION(S) / ED DIAGNOSES  Final diagnoses:  RUQ pain         NEW MEDICATIONS STARTED DURING THIS VISIT:  New Prescriptions   No medications on file      Eula Listen, MD 05/23/16 1515

## 2016-05-23 NOTE — ED Notes (Signed)
Spoke with CT and they advise that per admitting MD pt will have the CTA tomorrow as he already had contrast today.  Will pass this along to ICU

## 2016-05-23 NOTE — ED Notes (Signed)
Pt has 3/4 of 2nd bottle of contrast left. Ct informed.

## 2016-05-23 NOTE — ED Notes (Signed)
Gave pt a cup of ice water to drink.  Pt tells me that he can no longer tolerate the bipap and that his nose is in pain.  Pt does have a pressure spot on his nose.  Paused the bipap and placed pt on NRB temporarily while he drinks water.  pT appears to be tolerating bipap and asks refuses to have bipap placed back on at this time.  Pt appears to be tolerating the NRB fine at this time.  Will continue to monitor

## 2016-05-23 NOTE — ED Notes (Signed)
Pt placed on 4L [place] while drinking oral CT contrast. PT tolerating well.

## 2016-05-23 NOTE — Telephone Encounter (Signed)
Per Blanchie Serve is in the ER being admitted to hospital for possible sepsis. I explained that we will not be giving any narcotics to him until he is discharged from hospital. She stated OK I understand

## 2016-05-24 DIAGNOSIS — E871 Hypo-osmolality and hyponatremia: Secondary | ICD-10-CM

## 2016-05-24 DIAGNOSIS — J9620 Acute and chronic respiratory failure, unspecified whether with hypoxia or hypercapnia: Secondary | ICD-10-CM

## 2016-05-24 DIAGNOSIS — I420 Dilated cardiomyopathy: Secondary | ICD-10-CM

## 2016-05-24 DIAGNOSIS — E876 Hypokalemia: Secondary | ICD-10-CM

## 2016-05-24 DIAGNOSIS — E873 Alkalosis: Secondary | ICD-10-CM

## 2016-05-24 DIAGNOSIS — I959 Hypotension, unspecified: Secondary | ICD-10-CM

## 2016-05-24 LAB — BASIC METABOLIC PANEL
Anion gap: 8 (ref 5–15)
BUN: 24 mg/dL — AB (ref 6–20)
CALCIUM: 7.2 mg/dL — AB (ref 8.9–10.3)
CHLORIDE: 91 mmol/L — AB (ref 101–111)
CO2: 34 mmol/L — ABNORMAL HIGH (ref 22–32)
CREATININE: 1.04 mg/dL (ref 0.61–1.24)
GFR calc Af Amer: >60 mL/min (ref >60)
GFR calc non Af Amer: >60 mL/min (ref >60)
Glucose, Bld: 128 mg/dL — ABNORMAL HIGH (ref 65–99)
Potassium: 2.9 mmol/L — ABNORMAL LOW (ref 3.5–5.1)
SODIUM: 133 mmol/L — AB (ref 135–145)

## 2016-05-24 LAB — CBC
HCT: 23.4 % — ABNORMAL LOW (ref 40.0–52.0)
Hemoglobin: 8 g/dL — ABNORMAL LOW (ref 13.0–18.0)
MCH: 34.3 pg — ABNORMAL HIGH (ref 26.0–34.0)
MCHC: 34.3 g/dL (ref 32.0–36.0)
MCV: 100 fL (ref 80.0–100.0)
PLATELETS: 144 10*3/uL — AB (ref 150–440)
RBC: 2.34 MIL/uL — ABNORMAL LOW (ref 4.40–5.90)
RDW: 16 % — AB (ref 11.5–14.5)
WBC: 0.8 10*3/uL — AB (ref 3.8–10.6)

## 2016-05-24 LAB — PROCALCITONIN: Procalcitonin: 7.34 ng/mL

## 2016-05-24 LAB — POTASSIUM: POTASSIUM: 3.5 mmol/L (ref 3.5–5.1)

## 2016-05-24 MED ORDER — HYDROCORTISONE NA SUCCINATE PF 100 MG IJ SOLR
50.0000 mg | Freq: Two times a day (BID) | INTRAMUSCULAR | Status: DC
  Administered 2016-05-24 – 2016-05-27 (×6): 50 mg via INTRAVENOUS
  Filled 2016-05-24 (×6): qty 2

## 2016-05-24 MED ORDER — IPRATROPIUM-ALBUTEROL 0.5-2.5 (3) MG/3ML IN SOLN
3.0000 mL | Freq: Four times a day (QID) | RESPIRATORY_TRACT | Status: DC
  Administered 2016-05-24 – 2016-05-26 (×8): 3 mL via RESPIRATORY_TRACT
  Filled 2016-05-24 (×8): qty 3

## 2016-05-24 MED ORDER — SODIUM CHLORIDE 0.9 % IV BOLUS (SEPSIS)
250.0000 mL | Freq: Once | INTRAVENOUS | Status: AC
  Administered 2016-05-24: 250 mL via INTRAVENOUS

## 2016-05-24 MED ORDER — POTASSIUM CHLORIDE 10 MEQ/100ML IV SOLN
10.0000 meq | INTRAVENOUS | Status: AC
  Administered 2016-05-24 (×3): 10 meq via INTRAVENOUS
  Filled 2016-05-24 (×3): qty 100

## 2016-05-24 MED ORDER — SODIUM CHLORIDE 0.9 % IV BOLUS (SEPSIS)
500.0000 mL | Freq: Once | INTRAVENOUS | Status: AC
  Administered 2016-05-24: 500 mL via INTRAVENOUS

## 2016-05-24 MED ORDER — NOREPINEPHRINE 4 MG/250ML-% IV SOLN
0.0000 ug/min | INTRAVENOUS | Status: DC
  Administered 2016-05-24: 2 ug/min via INTRAVENOUS
  Filled 2016-05-24: qty 250

## 2016-05-24 MED ORDER — SODIUM CHLORIDE 0.9 % IV SOLN: 30.0000 meq | Freq: Once | INTRAVENOUS | Status: DC

## 2016-05-24 MED ORDER — POTASSIUM CHLORIDE CRYS ER 20 MEQ PO TBCR
40.0000 meq | EXTENDED_RELEASE_TABLET | Freq: Two times a day (BID) | ORAL | Status: DC
  Administered 2016-05-24 – 2016-05-26 (×5): 40 meq via ORAL
  Filled 2016-05-24 (×5): qty 2

## 2016-05-24 NOTE — Telephone Encounter (Signed)
Dr Rogue Bussing also refused Gabapentin at this time as pt is currently hospitalized.

## 2016-05-24 NOTE — Progress Notes (Signed)
[place] at [place] NAME: [name]    MR#:  [id number]  DATE OF BIRTH:  September 30, 1960  SUBJECTIVE:  CHIEF COMPLAINT:   Chief Complaint  Patient presents with  . Emesis  feels somewhat better, sleepy, off BiPAP and levophed, BP low,  REVIEW OF SYSTEMS:  Review of Systems  Constitutional: Positive for malaise/fatigue. Negative for chills, fever and weight loss.  HENT: Negative for nosebleeds and sore throat.   Eyes: Negative for blurred vision.  Respiratory: Positive for shortness of breath. Negative for cough and wheezing.   Cardiovascular: Negative for chest pain, orthopnea, leg swelling and PND.  Gastrointestinal: Positive for vomiting. Negative for abdominal pain, constipation, diarrhea, heartburn and nausea.  Genitourinary: Negative for dysuria and urgency.  Musculoskeletal: Negative for back pain.  Skin: Negative for rash.  Neurological: Positive for weakness. Negative for dizziness, speech change, focal weakness and headaches.  Endo/Heme/Allergies: Does not bruise/bleed easily.  Psychiatric/Behavioral: Negative for depression.    DRUG ALLERGIES:  No Known Allergies VITALS:  Blood pressure 90/70, pulse 82, temperature (!) 96.9 F (36.1 C), temperature source Axillary, resp. rate 16, height _0  (1.854 m), weight 56.9 kg (125 lb 7.1 oz), SpO2 (!) 88 %. PHYSICAL EXAMINATION:  Physical Exam  Constitutional: He appears lethargic, malnourished and dehydrated. He appears unhealthy. He appears cachectic. He appears toxic. He has a sickly appearance.  HENT:  Head: Normocephalic and atraumatic.  Eyes: Conjunctivae and EOM are normal. Pupils are equal, round, and reactive to light.  Neck: Normal range of motion. Neck supple. No tracheal deviation present. No thyromegaly present.  Cardiovascular: Normal rate, regular rhythm and normal heart sounds.   Pulmonary/Chest: Accessory muscle usage present. He is in respiratory distress. He  has decreased breath sounds in the right lower field. He has no wheezes. He exhibits no tenderness.  Abdominal: Soft. Bowel sounds are normal. He exhibits no distension. There is no tenderness.  Musculoskeletal: Normal range of motion.  Neurological: He appears lethargic. He is disoriented. No cranial nerve deficit.  Skin: Skin is warm and dry. No rash noted.  Psychiatric: Mood and affect normal.  lethargic   LABORATORY PANEL:  Male CBC  Recent Labs Lab 05/24/16 0336  WBC 0.8*  HGB 8.0*  HCT 23.4*  PLT 144*   ------------------------------------------------------------------------------------------------------------------ Chemistries   Recent Labs Lab 05/23/16 1337 05/24/16 0336  NA 125* 133*  K 2.1* 2.9*  CL 74* 91*  CO2 36* 34*  GLUCOSE 101* 128*  BUN 24* 24*  CREATININE 1.23 1.04  CALCIUM 8.3* 7.2*  MG 1.4*  --   AST 21  --   ALT 8*  --   ALKPHOS 50  --   BILITOT 1.2  --    RADIOLOGY:  Ct Abdomen Pelvis W Contrast  Result Date: 05/23/2016 CLINICAL DATA:  Daily vomiting epigastric and right upper quadrant pain EXAM: CT ABDOMEN AND PELVIS WITH CONTRAST TECHNIQUE: Multidetector CT imaging of the abdomen and pelvis was performed using the standard protocol following bolus administration of intravenous contrast. CONTRAST:  167m ISOVUE-300 IOPAMIDOL (ISOVUE-300) INJECTION 61% COMPARISON:  Head CT 05/24/2015, CT abdomen pelvis 09/24/2013, ultrasound abdomen 05/01/ 2018 FINDINGS: Lower chest: Respiratory motion artifact. Mildly nodular densities are visualized within the bilateral lung bases, right greater than left and to a lesser extent within the lingula. No pleural effusion. Partially visualized cardiac pacing leads. The heart does not appear enlarged. Hepatobiliary: Subcentimeter hypodense liver lesions, grossly stable, too small to further characterize. No calcified gallstones.  No biliary dilatation. Pancreas: Unremarkable. No surrounding inflammatory changes. Prominence  of the pancreatic duct at the head and neck does not appear significantly changed. Spleen: Normal in size without focal abnormality. Adrenals/Urinary Tract: Subcentimeter hypodense lesion lower pole left kidney too small to further characterize. Adrenal glands within normal limits. No hydronephrosis. Distended bladder Stomach/Bowel: Stomach is within normal limits. Appendix appears normal. No evidence of bowel wall thickening, distention, or inflammatory changes. Large amount of stool throughout the colon. Vascular/Lymphatic: Aortic atherosclerosis. No enlarged abdominal or pelvic lymph nodes. Reproductive: No masses Other: No free air or free fluid. Musculoskeletal: Heterogenous attenuation with innumerable lucent lesions in the spine ribs pelvis and proximal femurs. Severe compression deformities of T12 and L2 do not appear significantly changed compared with 2017 CT. Superior endplate compression fracture of L1, also unchanged. IMPRESSION: 1. No evidence for a bowel obstruction. Large amount of stool throughout the colon. Normal appendix. 2. Heterogenous appearance of the skeleton with multiple lucent lesions consistent with history of myeloma. Chronic compression deformities of T12, L1 and L2. 3. Mild nodular densities within the posterior lung bases and lingula suggestive of respiratory infection, to include atypical organisms. Electronically Signed   By: Donavan Foil M.D.   On: 05/23/2016 18:43   ASSESSMENT AND PLAN:   * Aspiration pneumonia with acute on chronic hypoxic respiratory failure and sepsis. Associated COPD exacerbation - likely aspiration. - continue IV steroids, Antibiotics - Scheduled Nebulizers - Inhalers - BiPAP as need, he's off when I saw him - PCCM following - off levophed for 2 hrs  * Vomiting likely due to severe constipation. Will need suppository/enema once his breathing improves.  * Hyponatremia and hypokalemia due to severe dehydration and decreased oral intake.  -  replete and recheck  * Acute encephalopathy likely due to hyponatremia. This is improving as per family at bedside.  * Multiple myeloma. Undergoing treatment at [place] with Dr. Rogue Bussing.  * DVT prophylaxis with Lovenox     All the records are reviewed and case discussed with Care Management/Social Worker. Management plans discussed with the patient, family and they are in agreement.  CODE STATUS: Full Code  TOTAL TIME TAKING CARE OF THIS PATIENT: 20 minutes.   More than 50% of the time was spent in counseling/coordination of care: YES  POSSIBLE D/C IN 3-4 DAYS, DEPENDING ON CLINICAL CONDITION.   Max Sane M.D on 05/24/2016 at 6:09 PM  Between 7am to 6pm - Pager - [phone]  After 6pm go to [email or web address] - Proofreader  [facility] [place] [facility]  Office  [phone]  CC: Primary care physician; Petra Kuba, MD  Note: This dictation was prepared with Dragon dictation along with smaller phrase technology. Any transcriptional errors that result from this process are unintentional.

## 2016-05-24 NOTE — Progress Notes (Signed)
[facility] NP made aware of WBC of 0.8. Patient placed on protective precautions.

## 2016-05-24 NOTE — Progress Notes (Signed)
Levophed drip initiated at 58mcg.

## 2016-05-24 NOTE — Progress Notes (Signed)
According to pts nurse Romie Minus she stated the pt did not want to be a DNR and he informed her he was confused when he was asked about his code status in the ER.  I spoke with the pt who is alert and oriented with his girlfriend present at pts bedside.  I explained the difference between a FULL CODE and DNR.  The pt stated he understood the difference and would like to be a FULL CODE.  Therefore, pts code status was changed to a FULL CODE per pt request.  Marda Stalker, [place] Pager [phone] (please enter 7 digits) [place] Pager [phone] (please enter 7 digits)  Merton Border, MD PCCM service Mobile [phone] Pager [phone] 05/25/2016 1:50 PM

## 2016-05-24 NOTE — Progress Notes (Signed)
[facility]cy NP notified of BP of 69/48(56) after multiple retakes. Orders to administer 235ml NS bolus.

## 2016-05-25 ENCOUNTER — Inpatient Hospital Stay (HOSPITAL_COMMUNITY)
Admit: 2016-05-25 | Discharge: 2016-05-25 | Disposition: A | Discharge status: Home / Self Care | Payer: Medicaid Other | Attending: Pulmonary Disease | Admitting: Pulmonary Disease

## 2016-05-25 DIAGNOSIS — I5031 Acute diastolic (congestive) heart failure: Secondary | ICD-10-CM

## 2016-05-25 DIAGNOSIS — J9621 Acute and chronic respiratory failure with hypoxia: Secondary | ICD-10-CM

## 2016-05-25 DIAGNOSIS — E43 Unspecified severe protein-calorie malnutrition: Secondary | ICD-10-CM | POA: Insufficient documentation

## 2016-05-25 LAB — BASIC METABOLIC PANEL
ANION GAP: 0 — AB (ref 5–15)
BUN: 20 mg/dL (ref 6–20)
CALCIUM: 7.5 mg/dL — AB (ref 8.9–10.3)
CO2: 32 mmol/L (ref 22–32)
Chloride: 103 mmol/L (ref 101–111)
Creatinine, Ser: 0.76 mg/dL (ref 0.61–1.24)
GFR calc Af Amer: >60 mL/min (ref >60)
GFR calc non Af Amer: >60 mL/min (ref >60)
GLUCOSE: 175 mg/dL — AB (ref 65–99)
POTASSIUM: 3.9 mmol/L (ref 3.5–5.1)
Sodium: 135 mmol/L (ref 135–145)

## 2016-05-25 LAB — CBC
HEMATOCRIT: 22.3 % — AB (ref 40.0–52.0)
HEMOGLOBIN: 7.5 g/dL — AB (ref 13.0–18.0)
MCH: 34 pg (ref 26.0–34.0)
MCHC: 33.7 g/dL (ref 32.0–36.0)
MCV: 100.8 fL — ABNORMAL HIGH (ref 80.0–100.0)
Platelets: 176 10*3/uL (ref 150–440)
RBC: 2.21 MIL/uL — ABNORMAL LOW (ref 4.40–5.90)
RDW: 16.3 % — ABNORMAL HIGH (ref 11.5–14.5)
WBC: 1.5 10*3/uL — ABNORMAL LOW (ref 3.8–10.6)

## 2016-05-25 LAB — PROCALCITONIN: Procalcitonin: 3.87 ng/mL

## 2016-05-25 LAB — ECHOCARDIOGRAM COMPLETE
Height: 73 in
WEIGHTICAEL: 2007.07 [oz_av]

## 2016-05-25 LAB — URINE CULTURE: Culture: NO GROWTH

## 2016-05-25 MED ORDER — ALBUTEROL SULFATE (2.5 MG/3ML) 0.083% IN NEBU: 2.5000 mg | INHALATION_SOLUTION | RESPIRATORY_TRACT | Status: DC | PRN

## 2016-05-25 MED ORDER — ENSURE ENLIVE PO LIQD
237.0000 mL | Freq: Three times a day (TID) | ORAL | Status: DC
  Administered 2016-05-25 – 2016-05-26 (×3): 237 mL via ORAL

## 2016-05-25 NOTE — Progress Notes (Signed)
Name: [name] MRN: [id number] DOB: [birth year]    ADMISSION DATE:  05/23/2016  BRIEF PATIENT DESCRIPTION:  56 yo male with known history of Diastolic Heart failure, COPD and Multiple Myeloma admitted with acute encephalopathy, nausea/vomiting, acute on chronic hypoxic respiratory failure secondary to possible aspiration pneumonia and AECOPD  SIGNIFICANT EVENTS  5/1 Patient admitted to the ICU with increased sob requiring NRB  STUDIES:  CT Abdomen Pelvis 05/1>>No evidence for a bowel obstruction. Large amount of stool throughout the colon. Normal appendix2. Heterogenous appearance of the skeleton with multiple lucent lesions consistent with history of myeloma. Chronic compressiondeformities of T12, L1 and L2. Mild nodular densities within the posterior lung bases andlingula suggestive of respiratory infection, to include atypical organisms  REVIEW OF SYSTEMS:  Positives in BOLD  Constitutional: Negative for fever, chills, weight loss, malaise/fatigue and diaphoresis.  HENT: Negative for hearing loss, ear pain, nosebleeds, congestion, sore throat, neck pain, tinnitus and ear discharge.  Eyes: Negative for blurred vision, double vision, photophobia, pain, discharge and redness.  Respiratory: cough, hemoptysis, sputum production, shortness of breath, wheezing and stridor.   Cardiovascular: Negative for chest pain, palpitations, orthopnea, claudication, leg swelling and PND.  Gastrointestinal: Negative for heartburn, nausea, vomiting, abdominal pain, diarrhea, constipation, blood in stool and melena.  Genitourinary: Negative for dysuria, urgency, frequency, hematuria and flank pain.  Musculoskeletal: Negative for myalgias, back pain, joint pain and falls.  Skin: Negative for itching and rash.  Neurological: Negative for dizziness, tingling, tremors, sensory change, speech change, focal weakness, seizures, loss of consciousness, weakness and headaches.  Endo/Heme/Allergies: Negative for  environmental allergies and polydipsia. Does not bruise/bleed easily.  SUBJECTIVE:  Pt states he has been have left sided chest pain that is worst during breathing he is currently on 3L O2 via nasal canula  VITAL SIGNS: Temp:  [96.6 F (35.9 C)-98.2 F (36.8 C)] 97.5 F (36.4 C) (05/03 0241) Pulse Rate:  [45-86] 74 (05/03 0241) Resp:  [11-20] 13 (05/03 0241) BP: (69-94)/(48-70) 78/52 (05/03 0241) SpO2:  [87 %-95 %] 95 % (05/03 0241)  PHYSICAL EXAMINATION: General: middle aged frail male NAD Neuro: alert and oriented, follows commands HEENT:  AT,[place], no JBD Cardiovascular: s1s2 rrr, no M/R/G Lungs: crackles bilateral bases, even, non labored Abdomen:  Hypoactive BS x4, soft, non tender, non distended  Musculoskeletal: No edema/cyanosis noted Skin:  Warm,dry and Intact, no rashes or lesions   Recent Labs Lab 05/23/16 1337 05/24/16 0336 05/24/16 2110 05/25/16 0413  NA 125* 133*  --  135  K 2.1* 2.9* 3.5 3.9  CL 74* 91*  --  103  CO2 36* 34*  --  32  BUN 24* 24*  --  20  CREATININE 1.23 1.04  --  0.76  GLUCOSE 101* 128*  --  175*    Recent Labs Lab 05/23/16 1337 05/24/16 0336 05/25/16 0413  HGB 10.8* 8.0* 7.5*  HCT 30.6* 23.4* 22.3*  WBC 2.3* 0.8* 1.5*  PLT 234 144* 176   Ct Abdomen Pelvis W Contrast  Result Date: 05/23/2016 CLINICAL DATA:  Daily vomiting epigastric and right upper quadrant pain EXAM: CT ABDOMEN AND PELVIS WITH CONTRAST TECHNIQUE: Multidetector CT imaging of the abdomen and pelvis was performed using the standard protocol following bolus administration of intravenous contrast. CONTRAST:  157m ISOVUE-300 IOPAMIDOL (ISOVUE-300) INJECTION 61% COMPARISON:  Head CT 05/24/2015, CT abdomen pelvis 09/24/2013, ultrasound abdomen 05/01/ 2018 FINDINGS: Lower chest: Respiratory motion artifact. Mildly nodular densities are visualized within the bilateral lung bases, right greater than left and  to a lesser extent within the lingula. No pleural effusion. Partially  visualized cardiac pacing leads. The heart does not appear enlarged. Hepatobiliary: Subcentimeter hypodense liver lesions, grossly stable, too small to further characterize. No calcified gallstones. No biliary dilatation. Pancreas: Unremarkable. No surrounding inflammatory changes. Prominence of the pancreatic duct at the head and neck does not appear significantly changed. Spleen: Normal in size without focal abnormality. Adrenals/Urinary Tract: Subcentimeter hypodense lesion lower pole left kidney too small to further characterize. Adrenal glands within normal limits. No hydronephrosis. Distended bladder Stomach/Bowel: Stomach is within normal limits. Appendix appears normal. No evidence of bowel wall thickening, distention, or inflammatory changes. Large amount of stool throughout the colon. Vascular/Lymphatic: Aortic atherosclerosis. No enlarged abdominal or pelvic lymph nodes. Reproductive: No masses Other: No free air or free fluid. Musculoskeletal: Heterogenous attenuation with innumerable lucent lesions in the spine ribs pelvis and proximal femurs. Severe compression deformities of T12 and L2 do not appear significantly changed compared with 2017 CT. Superior endplate compression fracture of L1, also unchanged. IMPRESSION: 1. No evidence for a bowel obstruction. Large amount of stool throughout the colon. Normal appendix. 2. Heterogenous appearance of the skeleton with multiple lucent lesions consistent with history of myeloma. Chronic compression deformities of T12, L1 and L2. 3. Mild nodular densities within the posterior lung bases and lingula suggestive of respiratory infection, to include atypical organisms. Electronically Signed   By: Donavan Foil M.D.   On: 05/23/2016 18:43   Dg Chest Portable 1 View  Result Date: 05/23/2016 CLINICAL DATA:  Vomiting for 4 days EXAM: PORTABLE CHEST 1 VIEW COMPARISON:  12/23/2014 FINDINGS: The sella There is no focal parenchymal opacity. There is no pleural effusion  or pneumothorax. The heart and mediastinal contours are unremarkable. Single lead pacemaker. The osseous structures are unremarkable. IMPRESSION: No active disease. Electronically Signed   By: Kathreen Devoid   On: 05/23/2016 14:29   US Abdomen Limited Ruq  Result Date: 05/23/2016 CLINICAL DATA:  Right upper quadrant pain EXAM: US ABDOMEN LIMITED - RIGHT UPPER QUADRANT COMPARISON:  None. FINDINGS: Gallbladder: No gallstones or wall thickening visualized. No sonographic Murphy sign noted by sonographer. Common bile duct: Diameter: 5 mm Liver: No focal lesion identified. Within normal limits in parenchymal echogenicity. IMPRESSION: No acute abnormality noted. Electronically Signed   By: Inez Catalina M.D.   On: 05/23/2016 14:59    ASSESSMENT / PLAN: Acute on chronic hypoxic respiratory failure secondary to AECOPD and likely aspiration Hypotension - suspect multifactorial including hypovolemia and cardiogenic Acute encephalopathy-improving History of dilated cardiomyopathy Multiple myeloma (undergoing treatment at cancer center with Dr. Rogue Bussing) Anemia without acute blood loss Neutropenia Elevated pro-calcitonin-improving  Hyponatremia-improving Hypokalemia-resolved  Severe constipation Contraction alkalosis  PLAN/REC: Continue NS with 20 meq KCL @50  ml/hr Trend WBC and PCT, and monitor fever curve Continue scheduled and prn bronchodilator therapy Continue empiric antibiotics Continue hydrocortisone for possible adrenal insufficiency Wean norepinephrine to off for MAP greater than 60 mmHg Will hold off on CT angiogram of chest for now as PE seems unlikely Echo pending  Fentanyl patch for pain management    Marda Stalker, [place] Pager [phone] (please enter 7 digits) PCCM Consult Pager [phone] (please enter 7 digits)   Agree with above. Transfer to med-surg. After transfer, PCCM will sign off. Please call if we can be of further assistance.  Merton Border, MD PCCM service Mobile [phone] Pager [phone] 05/25/2016 1:50 PM

## 2016-05-25 NOTE — Progress Notes (Signed)
*  PRELIMINARY RESULTS* Echocardiogram 2D Echocardiogram has been performed.  Sherrie Sport 05/25/2016, 11:17 AM

## 2016-05-25 NOTE — Progress Notes (Signed)
Initial Nutrition Assessment  DOCUMENTATION CODES:   Underweight, Severe malnutrition in context of chronic illness  INTERVENTION:   -Recommend downgrading diet consistency to Dysphagia III -Recommend Ensure Enlive po TID, each supplement provides 350 kcal and 20 grams of protein -Encourage smaller, more frequent meals   NUTRITION DIAGNOSIS:   Malnutrition (Severe) related to  (COPD, multiple myeloma) as evidenced by severe depletion of body fat, severe depletion of muscle mass.  GOAL:   Patient will meet greater than or equal to 90% of their needs  MONITOR:   PO intake, Supplement acceptance, Labs, Weight trends  REASON FOR ASSESSMENT:   Other (Comment) (underweight)    ASSESSMENT:   56 yo male admitted with N/V, confusion, increased SOB with acute on chronic respiratory failure, acute encephalopathy, adynamic ileus. Pt with hx of CHF, COPD, multiple myeloma  Pt flat affect, appears sad on visit today. Currently on [place]. Pt reports appetite has not been good, ate 25% at breakfast this AM. Reports he does not eat full meals at home, snacks and nibble bites and sips.  No vomiting today.   Pt has poor dentition and a very loose tooth on the bottom front (pt wiggling it back and forth on visit today) and reports some foods are difficult to chew.   Pt reports he has lost weight but unsure how much. No significant recent wt loss per wt encounters but pt is underweight.   Wt Readings from Last 10 Encounters:  05/24/16 125 lb 7.1 oz (56.9 kg)  05/05/16 122 lb (55.3 kg)  02/16/16 126 lb 8 oz (57.4 kg)  11/29/15 130 lb 6.4 oz (59.1 kg)  11/10/15 128 lb 3.2 oz (58.2 kg)  09/09/15 132 lb (59.9 kg)  07/05/15 132 lb 3.2 oz (60 kg)  06/02/15 132 lb 0.9 oz (59.9 kg)  05/24/15 130 lb 8.2 oz (59.2 kg)  05/19/15 130 lb 4.7 oz (59.1 kg)    Nutrition-Focused physical exam completed. Findings are severe fat depletion, moderate to severe muscle depletion, and no edema.   Labs:  reviewed Meds: NS with Kcl at 50 ml/hr  Diet Order: Regular Diet  Skin:  Reviewed, no issues  Last BM:  no documented BM  Height:   Ht Readings from Last 1 Encounters:  05/23/16 6' 1"  (1.854 m)    Weight:   Wt Readings from Last 1 Encounters:  05/24/16 125 lb 7.1 oz (56.9 kg)    BMI:  Body mass index is 16.55 kg/m.  Estimated Nutritional Needs:   Kcal:  1595-3967 kcals  Protein:  85-100 g  Fluid:  >/= 1.7 L  EDUCATION NEEDS:   No education needs identified at this time  [place], [place], LDN [phone] Pager  [phone] Weekend/On-Call Pager

## 2016-05-25 NOTE — Progress Notes (Signed)
[facility] [place] NAME: Wing Schoch    MR#:  [id number]  DATE OF BIRTH:  [birth year]  SUBJECTIVE:  CHIEF COMPLAINT:   Chief Complaint  Patient presents with  . Emesis   off BiPAP and levophed, BP low, Patient denies any dizziness REVIEW OF SYSTEMS:  Review of Systems  Constitutional: Positive for malaise/fatigue. Negative for chills, fever and weight loss.  HENT: Negative for nosebleeds and sore throat.   Eyes: Negative for blurred vision.  Respiratory: Positive for shortness of breath. Negative for cough and wheezing.   Cardiovascular: Negative for chest pain, orthopnea, leg swelling and PND.  Gastrointestinal: Negative for abdominal pain, constipation, diarrhea, heartburn, nausea and vomiting.  Genitourinary: Negative for dysuria and urgency.  Musculoskeletal: Negative for back pain.  Skin: Negative for rash.  Neurological: Positive for weakness. Negative for dizziness, speech change, focal weakness and headaches.  Endo/Heme/Allergies: Does not bruise/bleed easily.  Psychiatric/Behavioral: Negative for depression.    DRUG ALLERGIES:  No Known Allergies VITALS:  Blood pressure (!) 89/60, pulse 67, temperature 97.6 F (36.4 C), temperature source Axillary, resp. rate 15, height 6' 1" (1.854 m), weight 56.9 kg (125 lb 7.1 oz), SpO2 93 %. PHYSICAL EXAMINATION:  Physical Exam  Constitutional: He appears lethargic, malnourished and dehydrated. He appears unhealthy. He appears cachectic. He appears toxic. He has a sickly appearance.  HENT:  Head: Normocephalic and atraumatic.  Eyes: Conjunctivae and EOM are normal. Pupils are equal, round, and reactive to light.  Neck: Normal range of motion. Neck supple. No tracheal deviation present. No thyromegaly present.  Cardiovascular: Normal rate, regular rhythm and normal heart sounds.   Pulmonary/Chest: Accessory muscle usage present. He is in respiratory distress. He has decreased breath  sounds in the right lower field. He has no wheezes. He exhibits no tenderness.  Abdominal: Soft. Bowel sounds are normal. He exhibits no distension. There is no tenderness.  Musculoskeletal: Normal range of motion.  Neurological: He appears lethargic. He is disoriented. No cranial nerve deficit.  Skin: Skin is warm and dry. No rash noted.  Psychiatric: Mood and affect normal.  lethargic   LABORATORY PANEL:  Male CBC  Recent Labs Lab 05/25/16 0413  WBC 1.5*  HGB 7.5*  HCT 22.3*  PLT 176   ------------------------------------------------------------------------------------------------------------------ Chemistries   Recent Labs Lab 05/23/16 1337  05/25/16 0413  NA 125*  < > 135  K 2.1*  < > 3.9  CL 74*  < > 103  CO2 36*  < > 32  GLUCOSE 101*  < > 175*  BUN 24*  < > 20  CREATININE 1.23  < > 0.76  CALCIUM 8.3*  < > 7.5*  MG 1.4*  --   --   AST 21  --   --   ALT 8*  --   --   ALKPHOS 50  --   --   BILITOT 1.2  --   --   < > = values in this interval not displayed. RADIOLOGY:  No results found. ASSESSMENT AND PLAN:   * Aspiration pneumonia with acute on chronic hypoxic respiratory failure and sepsis,COPD exacerbation, likely aspiration. - continue IV steroids, Antibiotics - Scheduled Nebulizers - BiPAP as need, he's off BiPAP and  transfered to floor -Follow up with pulmonology  * hypotension could be from sepsis   hydration with IV fluids, empiric IV antibiotics and steroids for COPD exacerbation and possible intervention insufficiency -goal MAP greater than 60 -Echo with ejection fraction  55-60%. Hypokinesis of anteroseptal myocardium likely secondary to bundle branch block/paced rhythm  * Vomiting likely due to severe constipation. Will need suppository/enema once his breathing improves.  * Hyponatremia and hypokalemia due to severe dehydration and decreased oral intake.  - repleteD and recheckSodium at 135, potassium 3.9  * Acute encephalopathy likely due  to hyponatremia. This is improving as per family at bedside.  * Multiple myeloma. end-stage  Undergoing treatment at [facility] with Dr. Rogue Bussing.  * DVT prophylaxis with Lovenox    consult palliative care , given poor prognosis   All the records are reviewed and case discussed with Care Management/Social Worker. Management plans discussed with the patient, family and they are in agreement.  CODE STATUS: Full Code  TOTAL TIME TAKING CARE OF THIS PATIENT: 25  minutes.   More than 50% of the time was spent in counseling/coordination of care: YES  POSSIBLE D/C IN 3-4 DAYS, DEPENDING ON CLINICAL CONDITION.   Nicholes Mango M.D on 05/25/2016 at 2:17 PM  Between 7am to 6pm - Pager - [phone]  After 6pm go to [email or web address] - Proofreader  [facility]  Office  [phone]  CC: Primary care physician; Petra Kuba, MD  Note: This dictation was prepared with Dragon dictation along with smaller phrase technology. Any transcriptional errors that result from this process are unintentional.

## 2016-05-25 NOTE — Progress Notes (Signed)
Pt has remained alert and oriented with c/o hip pain-requested PRN dilaudid/Xanax this am around 0900. NSR on cardiac monitor. BP soft, but meeting MAP requirements > 60. Echo pending. Pt is requiring 4.5LNC. Lung sounds clear in upper lobes and diminished in lower. RR even and unlabored. Pt does desaturate to lower 80's with repositioning; however, SpO2 does increase with proper breathing and positioning techniques to lower 90's-SpO2 requirements of 88-92%. Pt did pass a lg BM this am. Palliative care has been consulted today for goals of care. GF updated and at bedside. Pt with orders to transfer to med-surg, without tele. Report given to [place].

## 2016-05-26 DIAGNOSIS — G4709 Other insomnia: Secondary | ICD-10-CM

## 2016-05-26 DIAGNOSIS — E43 Unspecified severe protein-calorie malnutrition: Secondary | ICD-10-CM

## 2016-05-26 DIAGNOSIS — Z515 Encounter for palliative care: Secondary | ICD-10-CM

## 2016-05-26 DIAGNOSIS — J690 Pneumonitis due to inhalation of food and vomit: Secondary | ICD-10-CM

## 2016-05-26 DIAGNOSIS — Z7189 Other specified counseling: Secondary | ICD-10-CM

## 2016-05-26 LAB — BASIC METABOLIC PANEL
ANION GAP: 5 (ref 5–15)
BUN: 13 mg/dL (ref 6–20)
CALCIUM: 7.9 mg/dL — AB (ref 8.9–10.3)
CHLORIDE: 108 mmol/L (ref 101–111)
CO2: 28 mmol/L (ref 22–32)
Creatinine, Ser: 0.87 mg/dL (ref 0.61–1.24)
GFR calc non Af Amer: >60 mL/min (ref >60)
Glucose, Bld: 107 mg/dL — ABNORMAL HIGH (ref 65–99)
Potassium: 4.7 mmol/L (ref 3.5–5.1)
Sodium: 141 mmol/L (ref 135–145)

## 2016-05-26 LAB — CBC WITH DIFFERENTIAL/PLATELET
BASOS ABS: 0 10*3/uL (ref 0–0.1)
BASOS PCT: 0 %
Eosinophils Absolute: 0 10*3/uL (ref 0–0.7)
Eosinophils Relative: 0 %
HEMATOCRIT: 25.3 % — AB (ref 40.0–52.0)
Hemoglobin: 8.7 g/dL — ABNORMAL LOW (ref 13.0–18.0)
Lymphocytes Relative: 11 %
Lymphs Abs: 0.3 10*3/uL — ABNORMAL LOW (ref 1.0–3.6)
MCH: 35.1 pg — ABNORMAL HIGH (ref 26.0–34.0)
MCHC: 34.2 g/dL (ref 32.0–36.0)
MCV: 102.4 fL — ABNORMAL HIGH (ref 80.0–100.0)
Monocytes Absolute: 0.5 10*3/uL (ref 0.2–1.0)
Monocytes Relative: 18 %
NEUTROS ABS: 1.9 10*3/uL (ref 1.4–6.5)
NEUTROS PCT: 71 %
Platelets: 235 10*3/uL (ref 150–440)
RBC: 2.47 MIL/uL — ABNORMAL LOW (ref 4.40–5.90)
RDW: 16.2 % — ABNORMAL HIGH (ref 11.5–14.5)
WBC: 2.8 10*3/uL — ABNORMAL LOW (ref 3.8–10.6)

## 2016-05-26 LAB — HIV ANTIBODY (ROUTINE TESTING W REFLEX): HIV SCREEN 4TH GENERATION: NONREACTIVE

## 2016-05-26 LAB — PROCALCITONIN: PROCALCITONIN: 1.66 ng/mL

## 2016-05-26 MED ORDER — HYDROMORPHONE HCL 2 MG PO TABS
4.0000 mg | ORAL_TABLET | ORAL | Status: AC | PRN
  Administered 2016-05-26 – 2016-05-27 (×5): 4 mg via ORAL
  Filled 2016-05-26 (×5): qty 2

## 2016-05-26 MED ORDER — HYDROMORPHONE HCL 2 MG PO TABS
2.0000 mg | ORAL_TABLET | ORAL | Status: AC
  Administered 2016-05-26: 14:00:00 2 mg via ORAL
  Filled 2016-05-26: qty 1

## 2016-05-26 MED ORDER — TRAZODONE HCL 50 MG PO TABS
50.0000 mg | ORAL_TABLET | Freq: Every day | ORAL | Status: DC
  Administered 2016-05-26 – 2016-05-27 (×2): 50 mg via ORAL
  Filled 2016-05-26 (×2): qty 1

## 2016-05-26 MED ORDER — IPRATROPIUM-ALBUTEROL 0.5-2.5 (3) MG/3ML IN SOLN
3.0000 mL | Freq: Three times a day (TID) | RESPIRATORY_TRACT | Status: DC
Start: 2016-05-26 — End: 2016-05-28
  Administered 2016-05-26 – 2016-05-28 (×6): 3 mL via RESPIRATORY_TRACT
  Filled 2016-05-26 (×6): qty 3

## 2016-05-26 MED ORDER — TRAMADOL HCL 50 MG PO TABS
50.0000 mg | ORAL_TABLET | Freq: Four times a day (QID) | ORAL | Status: DC | PRN
  Administered 2016-05-28: 50 mg via ORAL
  Filled 2016-05-26: qty 1

## 2016-05-26 MED ORDER — SENNA 8.6 MG PO TABS
2.0000 | ORAL_TABLET | Freq: Every day | ORAL | Status: DC
  Filled 2016-05-26 (×2): qty 2

## 2016-05-26 MED ORDER — POLYETHYLENE GLYCOL 3350 17 G PO PACK
17.0000 g | PACK | Freq: Every day | ORAL | Status: DC
  Filled 2016-05-26 (×2): qty 1

## 2016-05-26 MED ORDER — ALPRAZOLAM 0.5 MG PO TABS
0.5000 mg | ORAL_TABLET | Freq: Three times a day (TID) | ORAL | Status: DC
  Administered 2016-05-26 – 2016-05-28 (×6): 0.5 mg via ORAL
  Filled 2016-05-26 (×6): qty 1

## 2016-05-26 MED ORDER — SODIUM CHLORIDE 0.9 % IV SOLN
INTRAVENOUS | Status: DC
  Administered 2016-05-26: 16:00:00 via INTRAVENOUS

## 2016-05-26 NOTE — Evaluation (Signed)
Physical Therapy Evaluation Patient Details Name: [name] MRN: [id number] DOB: January 30, 1960 Today's Date: 05/26/2016   History of Present Illness  Pt is a 56 y/o M who presented with intractable vomiting, constipation, AMS, and wheezing.  Pt was found to have hyponatremia, hypokalemia.  Chest x-ray clear.  CT scan of abdomen showed significant stool burden.  Admitted with aspiration pneumonia with acute on chronic hypoxic respiratory failure and sepsis,COPD exacerbation, likely aspiration.  Pt's PMH includes durrently followed at cancer center for multiple myeloma, COPD, CHF, MI, substance abuse.    Clinical Impression  Pt admitted with above diagnosis. Pt currently with functional limitations due to the deficits listed below (see PT Problem List). [name] presents with generalized 10/10 pain but was agreeable to therapy.  He currently requires min guard assist for bed mobility and transfers. He declined attempting ambulation due to pain and weakness.  SpO2 as low as 87% on 4L O2 with mobility with pursed lip breathing and up to 90% on 4L O2 at rest.  Given pt's current mobility status, recommending SNF at d/c.  Pt will benefit from skilled PT to increase their independence and safety with mobility to allow discharge to the venue listed below.      Follow Up Recommendations SNF    Equipment Recommendations  None recommended by PT    Recommendations for Other Services OT consult     Precautions / Restrictions Precautions Precautions: Fall;Other (comment) Precaution Comments: Monitor O2 (on 3L at baseline) Restrictions Weight Bearing Restrictions: No      Mobility  Bed Mobility Overal bed mobility: Needs Assistance Bed Mobility: Supine to Sit     Supine to sit: Min guard;HOB elevated     General bed mobility comments: Pt requires increased time and effort and uses bed rail.  Pt moaning due to discomfort.  Transfers Overall transfer level: Needs assistance Equipment used:  Rolling walker (2 wheeled) Transfers: Sit to/from [facility] Sit to Stand: Min guard Stand pivot transfers: Min guard       General transfer comment: Pt requires close min guard for sit<>stand and stand pivot as he is unsteady.  Cues needed for proper hand placement and safe technique.  Ambulation/Gait             General Gait Details: Pt declined due to pain  Stairs            Wheelchair Mobility    Modified Rankin (Stroke Patients Only)       Balance Overall balance assessment: Needs assistance Sitting-balance support: No upper extremity supported;Feet supported Sitting balance-Leahy Scale: Good     Standing balance support: Bilateral upper extremity supported;During functional activity Standing balance-Leahy Scale: Poor Standing balance comment: Relies on RW for support for static and dynamic activities                             Pertinent Vitals/Pain Pain Assessment: 0-10 Pain Score: 10-Worst pain ever Pain Location: "all over" Pain Descriptors / Indicators: Aching Pain Intervention(s): Limited activity within patient's tolerance;Monitored during session;Repositioned    Home Living Family/patient expects to be discharged to:: Skilled nursing facility Living Arrangements: Parent;Other relatives (sister) Available Help at Discharge: Family;Available 24 hours/day Type of Home: House Home Access: Stairs to enter Entrance Stairs-Rails: None Entrance Stairs-Number of Steps: 3 Home Layout: One level Home Equipment: Walker - 4 wheels;Cane - single point;Shower seat;Grab bars - tub/shower Additional Comments: If pt does go home at d/c  he will be staying at his mom's house which is reflected in the information above regarding home layout.    Prior Function Level of Independence: Independent with assistive device(s)         Comments: Pt normally ambulates without AD but recently he has needed to use a rollator due to weakness.   He is independent with bathing, dressing.     Hand Dominance        Extremity/Trunk Assessment   Upper Extremity Assessment Upper Extremity Assessment:  (BUE strength grossly 4/5)    Lower Extremity Assessment Lower Extremity Assessment: Generalized weakness (BLE strength grossly at least 3/5)    Cervical / Trunk Assessment Cervical / Trunk Assessment: Kyphotic  Communication   Communication: No difficulties  Cognition Arousal/Alertness: Awake/alert Behavior During Therapy: Anxious;WFL for tasks assessed/performed Overall Cognitive Status: Within Functional Limits for tasks assessed                                        General Comments General comments (skin integrity, edema, etc.): SpO2 as low as 87% on 4L O2 with mobility with pursed lip breathing and up to 90% on 4L O2 at rest.      Exercises General Exercises - Lower Extremity Ankle Circles/Pumps: AROM;Both;10 reps;Supine Quad Sets: Strengthening;Both;10 reps;Supine Other Exercises Other Exercises: Encouraged pt to continue practicing pursed lip breathing throughout the day.   Assessment/Plan    PT Assessment Patient needs continued PT services  PT Problem List Decreased strength;Decreased activity tolerance;Decreased balance;Decreased knowledge of use of DME;Decreased safety awareness;Cardiopulmonary status limiting activity;Pain       PT Treatment Interventions DME instruction;Gait training;Stair training;Functional mobility training;Therapeutic activities;Therapeutic exercise;Balance training;Neuromuscular re-education;Patient/family education;Wheelchair mobility training;Modalities    PT Goals (Current goals can be found in the Care Plan section)  Acute Rehab PT Goals Patient Stated Goal: to get stronger PT Goal Formulation: With patient Time For Goal Achievement: 06/09/16 Potential to Achieve Goals: Fair    Frequency Min 2X/week   Barriers to discharge        Co-evaluation                AM-PAC PT "6 Clicks" Daily Activity  Outcome Measure Difficulty turning over in bed (including adjusting bedclothes, sheets and blankets)?: Total Difficulty moving from lying on back to sitting on the side of the bed? : Total Difficulty sitting down on and standing up from a chair with arms (e.g., wheelchair, bedside commode, etc,.)?: A Lot Help needed moving to and from a bed to chair (including a wheelchair)?: A Little Help needed walking in hospital room?: A Lot Help needed climbing 3-5 steps with a railing? : A Lot 6 Click Score: 11    End of Session Equipment Utilized During Treatment: Oxygen Activity Tolerance: Patient limited by pain;Patient limited by fatigue;Treatment limited secondary to medical complications (Comment) (hypoxia) Patient left: in chair;with call bell/phone within reach;with chair alarm set;with family/visitor present Nurse Communication: Mobility status;Other (comment) (SpO2) PT Visit Diagnosis: Unsteadiness on feet (R26.81);Muscle weakness (generalized) (M62.81)    Time: 8403-7543 PT Time Calculation (min) (ACUTE ONLY): 21 min   Charges:   PT Evaluation $PT Eval Moderate Complexity: 1 Procedure     PT G Codes:        Collie Siad PT, DPT 05/26/2016, 3:22 PM

## 2016-05-26 NOTE — Progress Notes (Signed)
Reported to Dr Margaretmary Eddy that patient had bigeminy with PVC's. MD acknowledged.

## 2016-05-26 NOTE — Plan of Care (Signed)
Problem: Pain Managment: Goal: General experience of comfort will improve Outcome: Not Progressing Requesting prn xanax 0.5 mg po TID more frequently than prescribed

## 2016-05-26 NOTE — Consult Note (Signed)
Consultation Note Date: 05/26/2016   Patient Name: [name]  DOB: [birth year]  MRN: [id number]  Age / Sex: 56 y.o., male  PCP: [name] Kuba, MD Referring Physician: Nicholes Mango, MD  Reason for Consultation: Establishing goals of care  HPI/Patient Profile: 56 y.o. male  with past medical history of Multiple myeloma (on Revlimid), COPD (on home O2), CHF, MI, s/p pacemaker w/ defibrillator placement admitted on 05/23/2016 with intractable nausea and vomiting r/t severe constipation. This admission he has developed sepsis r/t aspiration pneumonia. Palliative medicine consulted for [name].    Clinical Assessment and Goals of Care: Met with patient and his mother at bedside.   Introduced palliative medicine. Palliative medicine is specialized medical care for people living with serious illness. It focuses on providing relief from the symptoms and stress of a serious illness. The goal is to improve quality of life for both the patient and the family.  Patient lives at home. Has been caring for the son (who has autism) of his friend who died several years ago and this has caused him some significant stress given the burden of his chronic illnesses. Patient is tired and states he can't go on. We discussed the meaning of this, and he states he can't go on having everyone rely on him when he can't take care of himself. We discussed the need to offload some of the burden of his responsibilities.   Discussed the trajectory of his chronic illnesses. He expressed interest in Hospice care. Discussed philosophy of Hospice- providing support at end of life when the [place] is to focus on quality and comfort vs aggressive therapy and quantity. Patient does note that he feels he is nearing end of life. He has lost a significant amount of weight over the last year. His functional status has declined. However, when we discuss what  transitioning to Hospice means- stopping life prolonging measures including chemotherapy treatments- he reconsiders this. He thought that he could continue to receive his MM treatment with Hospice. He would like to discuss with [name] and his cardiac MD before making this decision. He does agree to Palliative followup at discharge for symptom management and ongoing [place].    He has a great deal of chronic pain. Especially in his back- noteably there are chronic compression fractures r/t MM. Fentanyl patch is at 118mg. Hydromorphone 283mfor breakthrough does not provide relief of breakthrough pain. Discussed increasing fentanyl patch but in the past this has made him too drowsy. Will increase prn hydromorphone.   He complains of anxiety and overall "misery". Per nursing he gets upset when his Xanax is not brought, however, he doesn't request it- will change to scheduled rather than prn.   He also complains of insomnia- states he doesn't sleep at all. He would like to try sleep aid.   Primary Decision Maker PATIENT    SUMMARY OF RECOMMENDATIONS -Continue current level of care - Breakathrough pain:  Hydromorphone 7m21mo q4hr  -Bowel prophylaxis d/t chronic opioid use:     -Senna  2 tabs po qhs for     -Miralax 17gm daily   -Insomnia/Anxiety/Depression: Trazadone 35m qhs   Code Status/Advance Care Planning:  DNR   Discharge Planning: Home with Hospice  Primary Diagnoses: Present on Admission: . Aspiration pneumonia ([facility]   I have reviewed the medical record, interviewed the patient and family, and examined the patient. The following aspects are pertinent.  Past Medical History:  Diagnosis Date  . Anxiety   . Atrial septal defect   . CHF (congestive heart failure) ([place]   . COPD (chronic obstructive pulmonary disease) ([place]   . Coronary artery disease   . Dysrhythmias   . GERD (gastroesophageal reflux disease)   . Hyperlipidemia   . Multiple myeloma ([facility]   . Multiple  myeloma ([place]   . Myocardial infarction ([facility]   . Substance abuse    Social History   Social History  . Marital status: Divorced    Spouse name: N/A  . Number of children: N/A  . Years of education: N/A   Social History Main Topics  . Smoking status: Current Every Day Smoker    Packs/day: 0.50    Years: 40.00    Types: Cigarettes  . Smokeless tobacco: Never Used  . Alcohol use No  . Drug use: No  . Sexual activity: Not Asked   Other Topics Concern  . None   Social History Narrative  . None   Family History  Problem Relation Age of Onset  . COPD Mother   . CAD Mother   . Heart attack Father    Scheduled Meds: . ALPRAZolam  0.5 mg Oral TID  . DULoxetine  30 mg Oral Daily  . enoxaparin (LOVENOX) injection  40 mg Subcutaneous Q24H  . feeding supplement (ENSURE ENLIVE)  237 mL Oral TID BM  . fentaNYL  100 mcg Transdermal Q48H  . gabapentin  200 mg Oral TID  . hydrocortisone sod succinate (SOLU-CORTEF) inj  50 mg Intravenous Q12H  . ipratropium-albuterol  3 mL Nebulization Q6H   Continuous Infusions: . sodium chloride    . piperacillin-tazobactam (ZOSYN)  IV Stopped (05/26/16 1427)   PRN Meds:.acetaminophen **OR** acetaminophen, albuterol, bisacodyl, HYDROmorphone, ondansetron **OR** ondansetron (ZOFRAN) IV, polyethylene glycol, sodium phosphate, traMADol Medications Prior to Admission:  Prior to Admission medications   Medication Sig Start Date End Date Taking? Authorizing Provider  albuterol (PROVENTIL HFA;VENTOLIN HFA) 108 (90 BASE) MCG/ACT inhaler Inhale 2 puffs into the lungs every 6 (six) hours as needed for wheezing or shortness of breath.   Yes Historical Provider, MD  ALPRAZolam (Duanne Moron 0.5 MG tablet Take 1 tablet (0.5 mg total) by mouth 3 (three) times daily as needed for anxiety. 05/11/16  Yes GCammie Sickle MD  aspirin 325 MG EC tablet Take 325 mg by mouth daily.   Yes Historical Provider, MD  carvedilol (COREG) 3.125 MG tablet Take 3.125 mg by mouth  2 (two) times daily with a meal.   Yes Historical Provider, MD  DULoxetine (CYMBALTA) 30 MG capsule TAKE ONE CAPSULE BY MOUTH EVERY DAY 03/23/16  Yes GCammie Sickle MD  fentaNYL (DURAGESIC - DOSED MCG/HR) 100 MCG/HR Place 1 patch (100 mcg total) onto the skin every other day. 04/21/16  Yes GCammie Sickle MD  furosemide (LASIX) 40 MG tablet Take 40 mg by mouth daily. 04/10/16  Yes Historical Provider, MD  gabapentin (NEURONTIN) 100 MG capsule Take 200 mg by mouth 3 (three) times daily.    Yes Historical Provider, MD  HYDROmorphone (DILAUDID) 2 MG  tablet Take 1 tablet (2 mg total) by mouth every 4 (four) hours as needed for severe pain. 05/11/16  Yes Cammie Sickle, MD  ipratropium-albuterol (DUONEB) 0.5-2.5 (3) MG/3ML SOLN Take 3 mLs by nebulization every 4 (four) hours as needed. 01/26/16  Yes Cammie Sickle, MD  lenalidomide (REVLIMID) 25 MG capsule Take 1 capsule (25 mg total) by mouth daily. For 21 days then off 7 days 05/19/16  Yes Cammie Sickle, MD  potassium chloride SA (K-DUR,KLOR-CON) 20 MEQ tablet Take one tablet 3 times daily for the first week.  Take one tablet daily after first week. 04/05/16  Yes Cammie Sickle, MD  promethazine (PHENERGAN) 25 MG tablet TAKE ONE TABLET BY MOUTH EVERY 4 HOURS AS NEEDED FOR NAUSEA 05/04/16  Yes Cammie Sickle, MD  tiotropium (SPIRIVA) 18 MCG inhalation capsule Place 1 capsule (18 mcg total) into inhaler and inhale daily. Patient not taking: Reported on 05/23/2016 12/27/14   Fritzi Mandes, MD   No Known Allergies Review of Systems  Constitutional: Positive for appetite change and unexpected weight change.  Respiratory: Positive for shortness of breath.   Gastrointestinal: Positive for nausea.  Musculoskeletal: Positive for arthralgias, back pain and myalgias.    Physical Exam  Constitutional: He is oriented to person, place, and time.  Cachetic   Cardiovascular:  tachycardic  Pulmonary/Chest:  Increased effort at  rest, on O2  Abdominal: Soft.  Neurological: He is alert and oriented to person, place, and time.  Skin: Skin is warm and dry.  Psychiatric: Judgment and thought content normal.  tearful  Nursing note and vitals reviewed.   Vital Signs: BP (!) 105/59 (BP Location: Left Arm)   Pulse (!) 111   Temp 97.6 F (36.4 C) (Oral)   Resp 16   Ht 6' (1.829 m)   Wt 60.9 kg (134 lb 3.2 oz)   SpO2 (!) 88%   BMI 18.20 kg/m  Pain Assessment: 0-10   Pain Score: 10-Worst pain ever   SpO2: SpO2: (!) 88 % O2 Device:SpO2: (!) 88 % O2 Flow Rate: .O2 Flow Rate (L/min): 4 L/min  IO: Intake/output summary:  Intake/Output Summary (Last 24 hours) at 05/26/16 1530 Last data filed at 05/26/16 0436  Gross per 24 hour  Intake          1645.83 ml  Output              250 ml  Net          1395.83 ml    LBM: Last BM Date: 05/26/16 Baseline Weight: Weight: 54.4 kg (120 lb) Most recent weight: Weight: 60.9 kg (134 lb 3.2 oz)     Palliative Assessment/Data: PPS: 40%   Flowsheet Rows     Most Recent Value  Intake Tab  Referral Department  Critical care  Unit at Time of Referral  Oncology Unit  Palliative Care Primary Diagnosis  Sepsis/Infectious Disease  Date Notified  05/25/16  Palliative Care Type  New Palliative care  Reason for referral  Clarify Goals of Care, Counsel Regarding Hospice  Date of Admission  05/23/16  # of days IP prior to Palliative referral  2  Clinical Assessment  Psychosocial & Spiritual Assessment  Palliative Care Outcomes      Thank you for this consult. Palliative medicine will continue to follow and assist as needed.   Time In: 2440 Time Out: 1400 Time Total: 75 minutes Greater than 50%  of this time was spent counseling and coordinating care related to the  above assessment and plan.  Signed by: Mariana Kaufman, AGNP-C Palliative Medicine    Please contact Palliative Medicine Team phone at [phone] for questions and concerns.  For individual provider: See  Shea Evans

## 2016-05-26 NOTE — Progress Notes (Signed)
[facility] at [place] NAME: [name]    MR#:  [id number]  DATE OF BIRTH:  [birth year]  SUBJECTIVE:  CHIEF COMPLAINT:   Chief Complaint  Patient presents with  . Emesis   Patient is not feeling well. Wants to change his CODE STATUS to DO NOT RESUSCITATE. In tears REVIEW OF SYSTEMS:  Review of Systems  Constitutional: Positive for malaise/fatigue. Negative for chills, fever and weight loss.  HENT: Negative for nosebleeds and sore throat.   Eyes: Negative for blurred vision.  Respiratory: Positive for shortness of breath. Negative for cough and wheezing.   Cardiovascular: Negative for chest pain, orthopnea, leg swelling and PND.  Gastrointestinal: Negative for abdominal pain, constipation, diarrhea, heartburn, nausea and vomiting.  Genitourinary: Negative for dysuria and urgency.  Musculoskeletal: Negative for back pain.  Skin: Negative for rash.  Neurological: Positive for weakness. Negative for dizziness, speech change, focal weakness and headaches.  Endo/Heme/Allergies: Does not bruise/bleed easily.  Psychiatric/Behavioral: Negative for depression.    DRUG ALLERGIES:  No Known Allergies VITALS:  Blood pressure (!) 105/59, pulse (!) 111, temperature 97.6 F (36.4 C), temperature source Oral, resp. rate 16, height 6' (1.829 m), weight 60.9 kg (134 lb 3.2 oz), SpO2 (!) 88 %. PHYSICAL EXAMINATION:  Physical Exam  Constitutional: He appears malnourished. He appears not lethargic and not dehydrated. He appears unhealthy. He appears cachectic.  Non-toxic appearance. He has a sickly appearance.  HENT:  Head: Normocephalic and atraumatic.  Eyes: Conjunctivae and EOM are normal. Pupils are equal, round, and reactive to light.  Neck: Normal range of motion. Neck supple. No tracheal deviation present. No thyromegaly present.  Cardiovascular: Normal rate, regular rhythm and normal heart sounds.   Pulmonary/Chest: Accessory muscle usage  present. He is in respiratory distress. He has decreased breath sounds in the right lower field. He has no wheezes. He exhibits no tenderness.  Abdominal: Soft. Bowel sounds are normal. He exhibits no distension. There is no tenderness.  Musculoskeletal: Normal range of motion.  Neurological: He appears not lethargic. He is not disoriented. No cranial nerve deficit.  Skin: Skin is warm and dry. No rash noted.  Psychiatric: Mood and affect normal.  lethargic   LABORATORY PANEL:  Male CBC  Recent Labs Lab 05/26/16 0424  WBC 2.8*  HGB 8.7*  HCT 25.3*  PLT 235   ------------------------------------------------------------------------------------------------------------------ Chemistries   Recent Labs Lab 05/23/16 1337  05/26/16 0424  NA 125*  < > 141  K 2.1*  < > 4.7  CL 74*  < > 108  CO2 36*  < > 28  GLUCOSE 101*  < > 107*  BUN 24*  < > 13  CREATININE 1.23  < > 0.87  CALCIUM 8.3*  < > 7.9*  MG 1.4*  --   --   AST 21  --   --   ALT 8*  --   --   ALKPHOS 50  --   --   BILITOT 1.2  --   --   < > = values in this interval not displayed. RADIOLOGY:  No results found. ASSESSMENT AND PLAN:   * Aspiration pneumonia with acute on chronic hypoxic respiratory failure and sepsis,COPD exacerbation, likely aspiration. - continue IV steroidsAnd wean off as tolerated. Continue Antibiotics IV Zosyn - Scheduled Nebulizers -  off BiPAP and  transfered to floor 05/25/16 -Follow up with pulmonology  * hypotension could be from sepsis   hydration with IV fluids, empiric IV  antibiotics and steroids for COPD exacerbation and possible intervention insufficiency -goal MAP greater than 60 -Echo with ejection fraction 55-60%. Hypokinesis of anteroseptal myocardium likely secondary to bundle branch block/paced rhythm  * Hyponatremia and hypokalemia due to severe dehydration and decreased oral intake.  - repleteD and recheckSodium at 135, potassium 3.9  * Acute encephalopathy likely due to  hyponatremia. Improved  * Multiple myeloma. end-stage  Undergoing treatment at [facility] with Dr. Rogue Bussing.  * DVT prophylaxis with Lovenox    consult palliative care , given poor prognosis   All the records are reviewed and case discussed with Care Management/Social Worker. Management plans discussed with the patient, family and they are in agreement.  CODE STATUS: DNR patient wants to Dr. palliative care and might consider hospice as clinically he is not getting better  TOTAL TIME TAKING CARE OF THIS PATIENT: 35  minutes.   More than 50% of the time was spent in counseling/coordination of care: YES  POSSIBLE D/C IN 3-4 DAYS, DEPENDING ON CLINICAL CONDITION.   Nicholes Mango M.D on 05/26/2016 at 12:36 PM  Between 7am to 6pm - Pager - [phone]  After 6pm go to [email or web address] - Proofreader  [facility]  Office  [phone]  CC: Primary care physician; Petra Kuba, MD  Note: This dictation was prepared with Dragon dictation along with smaller phrase technology. Any transcriptional errors that result from this process are unintentional.

## 2016-05-26 NOTE — Progress Notes (Signed)
CH responded to an OR for an AD. The RN informed that the MD had just placed a DNR on the Pt. RN stated that the Pt wanted to name his nephew as the 26. The Pt presented very groggy on my arrival and was having difficulty staying awake. He did state that he is "tired" and "wants this whole thing to be over". [place] assessed that it is not possible to educate or complete the AD at this time. CH offered a prayer for "rest" for the Pt. [place] spoke with the RN afterwards who stated she had given some medicine to help with anxiety which may have contributed to his grogginess. [place] will inform the ON-CALL [place] who will try to educated later this afternoon.    05/26/16 1100  Clinical Encounter Type  Visited With Patient;Health care provider  Visit Type Initial;Spiritual support  Referral From Nurse  Consult/Referral To Chaplain  Spiritual Encounters  Spiritual Needs Literature;Prayer

## 2016-05-27 LAB — BASIC METABOLIC PANEL
Anion gap: 3 — ABNORMAL LOW (ref 5–15)
BUN: 9 mg/dL (ref 6–20)
CHLORIDE: 107 mmol/L (ref 101–111)
CO2: 27 mmol/L (ref 22–32)
CREATININE: 0.64 mg/dL (ref 0.61–1.24)
Calcium: 7.7 mg/dL — ABNORMAL LOW (ref 8.9–10.3)
GFR calc Af Amer: >60 mL/min (ref >60)
GFR calc non Af Amer: >60 mL/min (ref >60)
Glucose, Bld: 101 mg/dL — ABNORMAL HIGH (ref 65–99)
POTASSIUM: 4.7 mmol/L (ref 3.5–5.1)
Sodium: 137 mmol/L (ref 135–145)

## 2016-05-27 MED ORDER — HYDROMORPHONE HCL 2 MG PO TABS
2.0000 mg | ORAL_TABLET | ORAL | Status: DC | PRN
  Administered 2016-05-27 – 2016-05-28 (×3): 2 mg via ORAL
  Filled 2016-05-27 (×3): qty 1

## 2016-05-27 MED ORDER — HYDROCORTISONE NA SUCCINATE PF 100 MG IJ SOLR
50.0000 mg | Freq: Every day | INTRAMUSCULAR | Status: DC
Start: 2016-05-28 — End: 2016-05-28
  Administered 2016-05-28: 50 mg via INTRAVENOUS
  Filled 2016-05-27: qty 2

## 2016-05-27 MED ORDER — FENTANYL 25 MCG/HR TD PT72
25.0000 ug | MEDICATED_PATCH | TRANSDERMAL | Status: DC
  Administered 2016-05-27: 12:00:00 25 ug via TRANSDERMAL
  Filled 2016-05-27: qty 1

## 2016-05-27 MED ORDER — AMOXICILLIN-POT CLAVULANATE 875-125 MG PO TABS
1.0000 | ORAL_TABLET | Freq: Two times a day (BID) | ORAL | Status: DC
  Administered 2016-05-27 – 2016-05-28 (×3): 1 via ORAL
  Filled 2016-05-27 (×3): qty 1

## 2016-05-27 MED ORDER — FENTANYL 75 MCG/HR TD PT72
125.0000 ug | MEDICATED_PATCH | TRANSDERMAL | Status: DC
  Administered 2016-05-28: 09:00:00 125 ug via TRANSDERMAL
  Filled 2016-05-27: qty 1

## 2016-05-27 NOTE — Progress Notes (Addendum)
[place] [facility] at [place] NAME: [name]    MR#:  [id number]  DATE OF BIRTH:  02-28-1960  SUBJECTIVE:  CHIEF COMPLAINT:   Chief Complaint  Patient presents with  . Emesis   Patient is Feeling somewhat better today. Refusing skilled nursing facility wants to go home with home health Taking Dilaudid 4 mg every 4 hours and on fentanyl patch 100 g REVIEW OF SYSTEMS:  Review of Systems  Constitutional: Positive for malaise/fatigue. Negative for chills, fever and weight loss.  HENT: Negative for nosebleeds and sore throat.   Eyes: Negative for blurred vision.  Respiratory: Positive for shortness of breath. Negative for cough and wheezing.   Cardiovascular: Negative for chest pain, orthopnea, leg swelling and PND.  Gastrointestinal: Negative for abdominal pain, constipation, diarrhea, heartburn, nausea and vomiting.  Genitourinary: Negative for dysuria and urgency.  Musculoskeletal: Negative for back pain.  Skin: Negative for rash.  Neurological: Positive for weakness. Negative for dizziness, speech change, focal weakness and headaches.  Endo/Heme/Allergies: Does not bruise/bleed easily.  Psychiatric/Behavioral: Negative for depression.    DRUG ALLERGIES:  No Known Allergies VITALS:  Blood pressure 102/60, pulse 65, temperature 98 F (36.7 C), temperature source Oral, resp. rate (!) 22, height 6' (1.829 m), weight 60.8 kg (134 lb), SpO2 99 %. PHYSICAL EXAMINATION:  Physical Exam  Constitutional: He appears malnourished. He appears not lethargic and not dehydrated. He appears unhealthy. He appears cachectic.  Non-toxic appearance. He has a sickly appearance.  HENT:  Head: Normocephalic and atraumatic.  Eyes: Conjunctivae and EOM are normal. Pupils are equal, round, and reactive to light.  Neck: Normal range of motion. Neck supple. No tracheal deviation present. No thyromegaly present.  Cardiovascular: Normal rate, regular rhythm and  normal heart sounds.   Pulmonary/Chest: No accessory muscle usage. No respiratory distress. He has no decreased breath sounds. He has no wheezes. He exhibits no tenderness.  Abdominal: Soft. Bowel sounds are normal. He exhibits no distension. There is no tenderness.  Musculoskeletal: Normal range of motion.  Neurological: He appears not lethargic. He is not disoriented. No cranial nerve deficit.  Skin: Skin is warm and dry. No rash noted.  Psychiatric: Mood and affect normal.  lethargic   LABORATORY PANEL:  Male CBC  Recent Labs Lab 05/26/16 0424  WBC 2.8*  HGB 8.7*  HCT 25.3*  PLT 235   ------------------------------------------------------------------------------------------------------------------ Chemistries   Recent Labs Lab 05/23/16 1337  05/27/16 0426  NA 125*  < > 137  K 2.1*  < > 4.7  CL 74*  < > 107  CO2 36*  < > 27  GLUCOSE 101*  < > 101*  BUN 24*  < > 9  CREATININE 1.23  < > 0.64  CALCIUM 8.3*  < > 7.7*  MG 1.4*  --   --   AST 21  --   --   ALT 8*  --   --   ALKPHOS 50  --   --   BILITOT 1.2  --   --   < > = values in this interval not displayed. RADIOLOGY:  No results found. ASSESSMENT AND PLAN:   * Aspiration pneumonia with acute on chronic hypoxic respiratory failure and sepsis,COPD exacerbation, likely aspiration. -Clinically somewhat better. Off BiPAP and transferred to floor on 05/25/2016. Currently on 3 L of oxygen via nasal cannula. Change IV Zosyn to by mouth Augmentin - continue IV steroids taper to once daily.  - Scheduled Nebulizers -Follow up  with pulmonology  * hypotension could be from sepsis   hydration with IV fluids provided and blood pressure is better we will stop IV fluids, IV Zosyn changed to by mouth Augmentin and steroids for COPD exacerbation and possible intervention insufficiency -goal MAP greater than 60 -Echo with ejection fraction 55-60%. Hypokinesis of anteroseptal myocardium likely secondary to bundle branch  block/paced rhythm  * Hyponatremia and hypokalemia due to severe dehydration and decreased oral intake.  - repleteD and recheckSodium at 135, potassium 3.9  * Acute encephalopathy likely due to hyponatremia. Improved  * Multiple myeloma. end-stage  Undergoing treatment at [facility] with Dr. Rogue Bussing.  *Acute on chronic Chronic pain-we will increase fentanyl patch dose from 100 g to 125 g and decrease Dilaudid 4 mg by mouth every 4 hours to 41m every 4 hours as needed  *Disposition-patient is refusing skilled nursing care as recommended by physical therapy and wants to go home with home health  * DVT prophylaxis with Lovenox    consult palliative care , given poor prognosis CODE STATUS has been changed to DO NOT RESUSCITATE and patient is not considering hospice care at this time-  All the records are reviewed and case discussed with Care Management/Social Worker. Management plans discussed with the patient, family and they are in agreement.  CODE STATUS: DNR TOTAL TIME TAKING CARE OF THIS PATIENT: 35  minutes.   More than 50% of the time was spent in counseling/coordination of care: YES  POSSIBLE D/C IN 1-2 DAYS, DEPENDING ON CLINICAL CONDITION.   GNicholes MangoM.D on 05/27/2016 at 11:50 AM  Between 7am to 6pm - Pager - [phone] After 6pm go to [email or web address] - pProofreader [facility]  Office  [phone] CC: Primary care physician; SPetra Kuba MD  Note: This dictation was prepared with Dragon dictation along with smaller phrase technology. Any transcriptional errors that result from this process are unintentional.

## 2016-05-27 NOTE — Clinical Social Work Note (Signed)
CSW spoke with the patient to discuss discharge planning. 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 CSW updated the attending MD and the [facility]. CSW signing off. Please consult should any other needs arise.  [name], MSW, Latanya Presser [phone]

## 2016-05-28 LAB — CBC
HEMATOCRIT: 23.9 % — AB (ref 40.0–52.0)
Hemoglobin: 7.9 g/dL — ABNORMAL LOW (ref 13.0–18.0)
MCH: 33.6 pg (ref 26.0–34.0)
MCHC: 33 g/dL (ref 32.0–36.0)
MCV: 101.8 fL — AB (ref 80.0–100.0)
Platelets: 254 10*3/uL (ref 150–440)
RBC: 2.35 MIL/uL — AB (ref 4.40–5.90)
RDW: 16.6 % — ABNORMAL HIGH (ref 11.5–14.5)
WBC: 2.6 10*3/uL — ABNORMAL LOW (ref 3.8–10.6)

## 2016-05-28 LAB — CULTURE, BLOOD (ROUTINE X 2)
CULTURE: NO GROWTH
Culture: NO GROWTH
Special Requests: ADEQUATE
Special Requests: ADEQUATE

## 2016-05-28 MED ORDER — AMOXICILLIN-POT CLAVULANATE 875-125 MG PO TABS: 1.0000 | ORAL_TABLET | Freq: Two times a day (BID) | ORAL | 0 refills | Status: DC

## 2016-05-28 MED ORDER — FENTANYL 25 MCG/HR TD PT72: 125.0000 ug | MEDICATED_PATCH | TRANSDERMAL | 0 refills | Status: DC

## 2016-05-28 MED ORDER — HYDROMORPHONE HCL 2 MG PO TABS: 2.0000 mg | ORAL_TABLET | Freq: Four times a day (QID) | ORAL | 0 refills | Status: DC | PRN

## 2016-05-28 MED ORDER — ENSURE ENLIVE PO LIQD: 237.0000 mL | Freq: Three times a day (TID) | ORAL | 1 refills | Status: DC

## 2016-05-28 MED ORDER — ALPRAZOLAM 0.5 MG PO TABS: 0.5000 mg | ORAL_TABLET | Freq: Three times a day (TID) | ORAL | 0 refills | Status: DC | PRN

## 2016-05-28 MED ORDER — PREDNISONE 50 MG PO TABS: 50.0000 mg | ORAL_TABLET | Freq: Every day | ORAL | Status: DC

## 2016-05-28 MED ORDER — TRAZODONE HCL 100 MG PO TABS: 100.0000 mg | ORAL_TABLET | Freq: Every day | ORAL | 0 refills | Status: DC

## 2016-05-28 MED ORDER — POLYETHYLENE GLYCOL 3350 17 G PO PACK: 17.0000 g | PACK | Freq: Every day | ORAL | 0 refills | Status: DC

## 2016-05-28 MED ORDER — TRAZODONE HCL 100 MG PO TABS: 100.0000 mg | ORAL_TABLET | Freq: Every day | ORAL | Status: DC

## 2016-05-28 MED ORDER — PREDNISONE 10 MG (21) PO TBPK: 10.0000 mg | ORAL_TABLET | Freq: Every day | ORAL | 0 refills | Status: DC

## 2016-05-28 MED ORDER — ACETAMINOPHEN 325 MG PO TABS: 650.0000 mg | ORAL_TABLET | Freq: Four times a day (QID) | ORAL | Status: DC | PRN

## 2016-05-28 MED ORDER — ALBUTEROL SULFATE HFA 108 (90 BASE) MCG/ACT IN AERS: 2.0000 | INHALATION_SPRAY | Freq: Four times a day (QID) | RESPIRATORY_TRACT | 1 refills | Status: DC | PRN

## 2016-05-28 NOTE — Progress Notes (Signed)
Discharge instructions along with home medications and follow up gone over with patient and mother. Both verbalize that they understood instructions. All prescriptions given to patient. IV removed. Pt being discharged home on 3L 02, no distress noted. Ammie Dalton, RN

## 2016-05-28 NOTE — Discharge Instructions (Signed)
Continue home oxygen via nasal cannula Continue home health Follow-up with primary care physician in 3-4 days Follow-up with oncology Dr. Yevette Edwards in 1 week Follow-up with pulmonology Dr. Jamal Collin in 2 weeks

## 2016-05-28 NOTE — Discharge Summary (Addendum)
[place] [facility] [place] NAME: [name]    MR#:  [id number]  DATE OF BIRTH:  05/13/1960  DATE OF ADMISSION:  05/23/2016 ADMITTING PHYSICIAN: [name] Bow, MD  DATE OF DISCHARGE: 05/28/16 PRIMARY CARE PHYSICIAN: [name] Kuba, MD    ADMISSION DIAGNOSIS:  Hypokalemia [E87.6] Hyponatremia [E87.1] SOB (shortness of breath) [R06.02] RUQ pain [R10.11] Acute respiratory failure with hypoxia and hypercarbia (HCC) [J96.01, J96.02] Constipation, unspecified constipation type [K59.00] Nausea and vomiting, intractability of vomiting not specified, unspecified vomiting type [R11.2]  DISCHARGE DIAGNOSIS:  Aspiration pneumonia Acute on chronic hypoxic respiratory failure  SECONDARY DIAGNOSIS:   Past Medical History:  Diagnosis Date  . Anxiety   . Atrial septal defect   . CHF (congestive heart failure) ([place])   . COPD (chronic obstructive pulmonary disease) ([place])   . Coronary artery disease   . Dysrhythmias   . GERD (gastroesophageal reflux disease)   . Hyperlipidemia   . Multiple myeloma ([place])   . Multiple myeloma ([place])   . Myocardial infarction ([place])   . Substance abuse     HOSPITAL COURSE:  HPI [name]  is a 56 y.o. male with a known history of Multiple myeloma, CHF, hyperlipidemia, COPD on 3 days home oxygen presents to the emergency room brought in by family due to intractable vomiting, constipation, altered mental status. He has also noticed some wheezing for 2 weeks. Patient follows at [facility] for his multiple myeloma. He is on narcotic pain medications for chronic pain. In the emergency room patient has been found to have hyponatremia, hypokalemia. Sepsis. Chest x-ray clear. CT scan of the abdomen showing changes from multiple myeloma and significant stool burden.     * Aspiration pneumonia with acute on chronic hypoxic respiratory failure and sepsis,COPD exacerbation, likely aspiration. -Clinically somewhat  better. Off BiPAP and transferred to floor on 05/25/2016. Currently on 3 L of oxygen via nasal cannula. Change IV Zosyn to by mouth Augmentin - continue IV steroids taper to once daily.  - Scheduled Nebulizers -Follow up with pulmonology OP PRN, okay to discharge patient with by mouth Augmentin and  steroid tapering dose  * hypotension could be from sepsis   hydration with IV fluids provided and blood pressure is better we will stop IV fluids, IV Zosyn changed to by mouth Augmentin and steroids for COPD exacerbation and possible intervention insufficiency -goal MAP greater than 60 -Echo with ejection fraction 55-60%. Hypokinesis of anteroseptal myocardium likely secondary to bundle branch block/paced rhythm  * Hyponatremia and hypokalemia due to severe dehydration and decreased oral intake.  - repleteD and recheckSodium at 135, potassium 3.9  * Acute encephalopathy likely due to hyponatremia. Improved  * Multiple myeloma. end-stage  Undergoing treatment at [facility] with Dr. Rogue [name]. Revlimid on hold for now until he sees oncology  *Acute on chronic Chronic pain-we will increase fentanyl patch dose from 100 g to 125 g and decrease Dilaudid 4 mg by mouth every 4 hours to 3m every 4 hours as needed. Patient is clinically doing fine. Wants to go home today. Patient will be benefited with pain management, PCP to consider referring patient to pain management clinic  *Disposition-patient is refusing skilled nursing care as recommended by physical therapy and wants to go home with home health. Palliative care follow-up as an outpatient if needed. Patient reports he has strong family support at home. Mom and sister take care of him at home  * DVT prophylaxis with Lovenox  DISCHARGE  CONDITIONS:   fair  CONSULTS OBTAINED:     PROCEDURES   DRUG ALLERGIES:  No Known Allergies  DISCHARGE MEDICATIONS:   Current Discharge Medication List    START taking these medications    Details  acetaminophen (TYLENOL) 325 MG tablet Take 2 tablets (650 mg total) by mouth every 6 (six) hours as needed for mild pain (or Fever >/= 101).    amoxicillin-clavulanate (AUGMENTIN) 875-125 MG tablet Take 1 tablet by mouth every 12 (twelve) hours. Qty: 14 tablet, Refills: 0    feeding supplement, ENSURE ENLIVE, (ENSURE ENLIVE) LIQD Take 237 mLs by mouth 3 (three) times daily between meals. Qty: 90 Bottle, Refills: 1    polyethylene glycol (MIRALAX / GLYCOLAX) packet Take 17 g by mouth daily. Qty: 30 each, Refills: 0    predniSONE (STERAPRED UNI-PAK 21 TAB) 10 MG (21) TBPK tablet Take 1 tablet (10 mg total) by mouth daily. Take 6 tablets by mouth for 1 day followed by  5 tablets by mouth for 1 day followed by  4 tablets by mouth for 1 day followed by  3 tablets by mouth for 1 day followed by  2 tablets by mouth for 1 day followed by  1 tablet by mouth for a day and stop Qty: 21 tablet, Refills: 0    traZODone (DESYREL) 100 MG tablet Take 1 tablet (100 mg total) by mouth at bedtime. Qty: 30 tablet, Refills: 0      CONTINUE these medications which have CHANGED   Details  albuterol (PROVENTIL HFA;VENTOLIN HFA) 108 (90 Base) MCG/ACT inhaler Inhale 2 puffs into the lungs every 6 (six) hours as needed for wheezing or shortness of breath. Qty: 1 Inhaler, Refills: 1    ALPRAZolam (XANAX) 0.5 MG tablet Take 1 tablet (0.5 mg total) by mouth 3 (three) times daily as needed for anxiety. Qty: 30 tablet, Refills: 0   Associated Diagnoses: Anxiety; Multiple myeloma not having achieved remission (HCC)    fentaNYL (DURAGESIC - DOSED MCG/HR) 25 MCG/HR patch Place 5 patches (125 mcg total) onto the skin every other day. Qty: 5 patch, Refills: 0    HYDROmorphone (DILAUDID) 2 MG tablet Take 1 tablet (2 mg total) by mouth every 6 (six) hours as needed for severe pain. Qty: 30 tablet, Refills: 0   Associated Diagnoses: Multiple myeloma not having achieved remission ([place])      CONTINUE these  medications which have NOT CHANGED   Details  aspirin 325 MG EC tablet Take 325 mg by mouth daily.    DULoxetine (CYMBALTA) 30 MG capsule TAKE ONE CAPSULE BY MOUTH EVERY DAY Qty: 90 capsule, Refills: 1    gabapentin (NEURONTIN) 100 MG capsule Take 200 mg by mouth 3 (three) times daily.     ipratropium-albuterol (DUONEB) 0.5-2.5 (3) MG/3ML SOLN Take 3 mLs by nebulization every 4 (four) hours as needed. Qty: 360 mL, Refills: 3   Associated Diagnoses: COPD (chronic obstructive pulmonary disease) with chronic bronchitis ([place]); Multiple myeloma not having achieved remission ([place]); Anxiety    promethazine (PHENERGAN) 25 MG tablet TAKE ONE TABLET BY MOUTH EVERY 4 HOURS AS NEEDED FOR NAUSEA Qty: 60 tablet, Refills: 1    tiotropium (SPIRIVA) 18 MCG inhalation capsule Place 1 capsule (18 mcg total) into inhaler and inhale daily. Qty: 30 capsule, Refills: 12      STOP taking these medications     carvedilol (COREG) 3.125 MG tablet      fentaNYL (DURAGESIC - DOSED MCG/HR) 100 MCG/HR      furosemide (  LASIX) 40 MG tablet      lenalidomide (REVLIMID) 25 MG capsule      potassium chloride SA (K-DUR,KLOR-CON) 20 MEQ tablet          DISCHARGE INSTRUCTIONS:   Continue home oxygen via nasal cannula Continue home health Follow-up with primary care physician in 3-4 days Follow-up with oncology Dr. Yevette Edwards in 1 week Follow-up with pulmonology Dr. Jamal Collin in 2 weeks Outpatient follow-up with palliative care in 1 week  DIET:  Regular diet  DISCHARGE CONDITION:  Fair  ACTIVITY:  Activity as tolerated per PT  OXYGEN:  Home Oxygen: Yes.     Oxygen Delivery: 3 liters/min via Patient connected to nasal cannula oxygen  DISCHARGE LOCATION:  home   If you experience worsening of your admission symptoms, develop shortness of breath, life threatening emergency, suicidal or homicidal thoughts you must seek medical attention immediately by calling 911 or calling your MD immediately  if  symptoms less severe.  You Must read complete instructions/literature along with all the possible adverse reactions/side effects for all the Medicines you take and that have been prescribed to you. Take any new Medicines after you have completely understood and accpet all the possible adverse reactions/side effects.   Please note  You were cared for by a hospitalist during your hospital stay. If you have any questions about your discharge medications or the care you received while you were in the hospital after you are discharged, you can call the unit and asked to speak with the hospitalist on call if the hospitalist that took care of you is not available. Once you are discharged, your primary care physician will handle any further medical issues. Please note that NO REFILLS for any discharge medications will be authorized once you are discharged, as it is imperative that you return to your primary care physician (or establish a relationship with a primary care physician if you do not have one) for your aftercare needs so that they can reassess your need for medications and monitor your lab values.     Today  Chief Complaint  Patient presents with  . Emesis   Patient did not sleep well last night, requesting to increase his Seroquel. Pain is manageable with the current regimen and wants to go home, reports he has strong family support at home. She wants to follow up with palliative care as an outpatient and might consider hospice care  ROS:  CONSTITUTIONAL: Denies fevers, chills. Denies any fatigue, weakness.  EYES: Denies blurry vision, double vision, eye pain. EARS, NOSE, THROAT: Denies tinnitus, ear pain, hearing loss. RESPIRATORY: Denies cough, wheeze, shortness of breath.  CARDIOVASCULAR: Denies chest pain, palpitations, edema.  GASTROINTESTINAL: Denies nausea, vomiting, diarrhea, abdominal pain. Denies bright red blood per rectum. GENITOURINARY: Denies dysuria, hematuria. ENDOCRINE:  Denies nocturia or thyroid problems. HEMATOLOGIC AND LYMPHATIC: Denies easy bruising or bleeding. SKIN: Denies rash or lesion. MUSCULOSKELETAL: Denies pain in neck, back, shoulder, knees, hips or arthritic symptoms.  NEUROLOGIC: Denies paralysis, paresthesias.  PSYCHIATRIC: Denies anxiety or depressive symptoms.   VITAL SIGNS:  Blood pressure 105/69, pulse 79, temperature 97.6 F (36.4 C), temperature source Oral, resp. rate (!) 23, height 6' (1.829 m), weight 60.6 kg (133 lb 11.2 oz), SpO2 97 %.  I/O:    Intake/Output Summary (Last 24 hours) at 05/28/16 1123 Last data filed at 05/27/16 1800  Gross per 24 hour  Intake              480 ml  Output  1300 ml  Net             -820 ml    PHYSICAL EXAMINATION:  GENERAL:  56 y.o.-year-old patient lying in the bed with no acute distress.  EYES: Pupils equal, round, reactive to light and accommodation. No scleral icterus. Extraocular muscles intact.  HEENT: Head atraumatic, normocephalic. Oropharynx and nasopharynx clear.  NECK:  Supple, no jugular venous distention. No thyroid enlargement, no tenderness.  LUNGS: Mod breath sounds bilaterally, no wheezing, rales,rhonchi or crepitation. No use of accessory muscles of respiration.  CARDIOVASCULAR: S1, S2 normal. No murmurs, rubs, or gallops.  ABDOMEN: Soft, non-tender, non-distended. Bowel sounds present. No organomegaly or mass.  EXTREMITIES: No pedal edema, cyanosis, or clubbing.  NEUROLOGIC: Cranial nerves II through XII are intact. Muscle strength 5/5 in all extremities. Sensation intact. Gait not checked. Generalized weakness is present PSYCHIATRIC: The patient is alert and oriented x 3.  SKIN: No obvious rash, lesion, or ulcer.   DATA REVIEW:   CBC  Recent Labs Lab 05/28/16 0522  WBC 2.6*  HGB 7.9*  HCT 23.9*  PLT 254    Chemistries   Recent Labs Lab 05/23/16 1337  05/27/16 0426  NA 125*  < > 137  K 2.1*  < > 4.7  CL 74*  < > 107  CO2 36*  < > 27   GLUCOSE 101*  < > 101*  BUN 24*  < > 9  CREATININE 1.23  < > 0.64  CALCIUM 8.3*  < > 7.7*  MG 1.4*  --   --   AST 21  --   --   ALT 8*  --   --   ALKPHOS 50  --   --   BILITOT 1.2  --   --   < > = values in this interval not displayed.  Cardiac Enzymes  Recent Labs Lab 05/23/16 1337  TROPONINI 0.03*    Microbiology Results  Results for orders placed or performed during the hospital encounter of 05/23/16  Urine culture     Status: None   Collection Time: 05/23/16  1:37 PM  Result Value Ref Range Status   Specimen Description URINE, RANDOM  Final   Special Requests NONE  Final   Culture   Final    NO GROWTH Performed at [facility], [place] [address] [place],  [address]    Report Status 05/25/2016 FINAL  Final  Blood Culture (routine x 2)     Status: None (Preliminary result)   Collection Time: 05/23/16  1:49 PM  Result Value Ref Range Status   Specimen Description BLOOD LT FOREARM  Final   Special Requests   Final    BOTTLES DRAWN AEROBIC AND ANAEROBIC Blood Culture adequate volume   Culture NO GROWTH 4 DAYS  Final   Report Status PENDING  Incomplete  Blood Culture (routine x 2)     Status: None (Preliminary result)   Collection Time: 05/23/16  1:59 PM  Result Value Ref Range Status   Specimen Description BLOOD RT AC  Final   Special Requests   Final    BOTTLES DRAWN AEROBIC AND ANAEROBIC Blood Culture adequate volume   Culture NO GROWTH 4 DAYS  Final   Report Status PENDING  Incomplete  MRSA PCR Screening     Status: None   Collection Time: 05/23/16  9:48 PM  Result Value Ref Range Status   MRSA by PCR NEGATIVE NEGATIVE Final    Comment:  The GeneXpert MRSA Assay (FDA approved for NASAL specimens only), is one component of a comprehensive MRSA colonization surveillance program. It is not intended to diagnose MRSA infection nor to guide or monitor treatment for MRSA infections.     RADIOLOGY:  No results found.  EKG:   Orders  placed or performed during the hospital encounter of 05/23/16  . EKG 12-Lead  . EKG 12-Lead  . ED EKG within 10 minutes  . ED EKG within 10 minutes      Management plans discussed with the patient, family and they are in agreement.  CODE STATUS:     Code Status Orders        Start     Ordered   05/26/16 1100  Do not attempt resuscitation (DNR)  Continuous    Question Answer Comment  In the event of cardiac or respiratory ARREST Do not call a "code blue"   In the event of cardiac or respiratory ARREST Do not perform Intubation, CPR, defibrillation or ACLS   In the event of cardiac or respiratory ARREST Use medication by any route, position, wound care, and other measures to relive pain and suffering. May use oxygen, suction and manual treatment of airway obstruction as needed for comfort.   Comments rn may pronounce      05/26/16 1100    Code Status History    Date Active Date Inactive Code Status Order ID Comments User Context   05/24/2016  5:40 PM 05/26/2016 10:59 AM Full Code [id number]  Awilda Bill, NP Inpatient   05/23/2016  7:15 PM 05/24/2016  5:40 PM DNR [id number]  [name] Bow, MD ED   05/23/2016  1:53 PM 05/23/2016  7:15 PM DNR [id number]  Eula Listen, MD ED   12/23/2014  6:01 PM 12/27/2014  4:29 PM Full Code [id number]  Theodoro Grist, MD Inpatient      TOTAL TIME TAKING CARE OF THIS PATIENT: 45  minutes.   Note: This dictation was prepared with Dragon dictation along with smaller phrase technology. Any transcriptional errors that result from this process are unintentional.   @[facility] @  on 05/28/2016 at 11:23 AM  Between 7am to 6pm - Pager - [phone]  After 6pm go to [email or web address] - password [email or web address] [facility]  Office  [phone]  CC: Primary care physician; [name] Kuba, MD

## 2016-05-28 NOTE — Care Management Note (Signed)
Case Management Note  Patient Details  Name: [name] MRN: [id number] Date of Birth: 02/05/1960  Subjective/Objective:      Discussed discharge planning with [name] who reports that he intends to return home after discharge. He has arranged transportation home and will have his home portable 02 tank brought to [facility] prior to his discharge today. A request for home health PT, RN, Aide, OT, SW, Respiratory care, was called to Melene Muller at [facility]. [employer] supplies [name] Green's chronic home 2.               Action/Plan:   Expected Discharge Date:  05/28/16               Expected Discharge Plan:     In-House Referral:     Discharge planning Services     Post Acute Care Choice:    Choice offered to:     DME Arranged:    DME Agency:     HH Arranged:    HH Agency:     Status of Service:     If discussed at [employer], dates discussed:    Additional Comments:  Keigan Girten A, RN 05/28/2016, 11:44 AM

## 2016-05-29 ENCOUNTER — Other Ambulatory Visit: Payer: Self-pay | Admitting: *Deleted

## 2016-05-29 MED ORDER — FENTANYL 25 MCG/HR TD PT72: 25.0000 ug | MEDICATED_PATCH | TRANSDERMAL | 0 refills | Status: DC

## 2016-05-29 MED ORDER — FENTANYL 100 MCG/HR TD PT72
100.0000 ug | MEDICATED_PATCH | TRANSDERMAL | 0 refills | Status: DC
Start: 2016-05-29 — End: 2016-06-26

## 2016-05-29 NOTE — Progress Notes (Signed)
New referral for home palliative received from Palliative Medicine NP Mariana Kaufman. Patient information faxed to referral. Thank you. Flo Shanks RN, BSN, [facility] [place], hospital Liaison [phone] c

## 2016-06-02 ENCOUNTER — Encounter: Payer: Self-pay | Admitting: *Deleted

## 2016-06-02 NOTE — Progress Notes (Signed)
Sullivan Lone, [facility] DSS- Adult protective services came to cancer center today to request records. Personally asked to speak to Dr. Rogue Bussing. States that he received a phone call for DSS to follow-up on patient's concerns. Provided a letter to RN to request records. He states that he needs further charting information to follow-up on this referral. I deferred Thomasena Edis to [place]. I explained to him that I was personally unable to provide him with any clinical information on this patient. He needs to request for this information through HIM. He thanked for directing him to the appropriate department and left.

## 2016-06-12 ENCOUNTER — Other Ambulatory Visit: Payer: Self-pay | Admitting: Internal Medicine

## 2016-06-12 ENCOUNTER — Other Ambulatory Visit: Payer: Self-pay | Admitting: *Deleted

## 2016-06-12 DIAGNOSIS — F419 Anxiety disorder, unspecified: Secondary | ICD-10-CM

## 2016-06-12 DIAGNOSIS — R110 Nausea: Secondary | ICD-10-CM

## 2016-06-12 DIAGNOSIS — Z5189 Encounter for other specified aftercare: Secondary | ICD-10-CM

## 2016-06-12 DIAGNOSIS — C9000 Multiple myeloma not having achieved remission: Secondary | ICD-10-CM

## 2016-06-12 MED ORDER — ONDANSETRON HCL 4 MG PO TABS: 4.0000 mg | ORAL_TABLET | Freq: Three times a day (TID) | ORAL | 0 refills | Status: DC | PRN

## 2016-06-12 MED ORDER — ALPRAZOLAM 0.5 MG PO TABS
0.5000 mg | ORAL_TABLET | Freq: Three times a day (TID) | ORAL | 0 refills | Status: DC | PRN
Start: 2016-06-12 — End: 2016-07-13

## 2016-06-12 MED ORDER — HYDROMORPHONE HCL 2 MG PO TABS: 2.0000 mg | ORAL_TABLET | Freq: Four times a day (QID) | ORAL | 0 refills | Status: DC | PRN

## 2016-06-26 ENCOUNTER — Other Ambulatory Visit: Payer: Self-pay | Admitting: *Deleted

## 2016-06-26 MED ORDER — FENTANYL 100 MCG/HR TD PT72: 100.0000 ug | MEDICATED_PATCH | TRANSDERMAL | 0 refills | Status: DC

## 2016-06-26 MED ORDER — FENTANYL 25 MCG/HR TD PT72: 25.0000 ug | MEDICATED_PATCH | TRANSDERMAL | 0 refills | Status: DC

## 2016-06-26 NOTE — Telephone Encounter (Signed)
Going out of town day after tomorrow ans needs refill of fentanyl patches before he goes

## 2016-06-28 ENCOUNTER — Telehealth: Payer: Self-pay | Admitting: *Deleted

## 2016-06-28 NOTE — Telephone Encounter (Signed)
Per Heather,this is on hold until evaluated by md This was sent back to [facility]. Patient informed that he was to stop taking it when e was discharged form hospital and he stated he did not know that an djust completed a 21 day cycle this past Friday 6/1. I confirmed his appts with him and he repeated tham back to me twice for 6/7 for lab and 6/14 for Dr Janese Banks

## 2016-06-28 NOTE — Telephone Encounter (Signed)
Asking for a refill of his Revlimid which was supposed to be discontinued when he was discharged form the hospital and he was to FU with Dr B 1 week post discharge. He states pharmacy told him they have sent request twice and we are not responding. He has FU appt with Dr Janese Banks on 6/14. Please advise if he is to wait until seen for refill or are we going to refill it?

## 2016-06-29 ENCOUNTER — Other Ambulatory Visit: Payer: Self-pay | Admitting: *Deleted

## 2016-06-29 ENCOUNTER — Inpatient Hospital Stay: Payer: Medicaid Other | Attending: Oncology

## 2016-06-29 ENCOUNTER — Telehealth: Payer: Self-pay | Admitting: *Deleted

## 2016-06-29 DIAGNOSIS — E876 Hypokalemia: Secondary | ICD-10-CM

## 2016-06-29 DIAGNOSIS — F17210 Nicotine dependence, cigarettes, uncomplicated: Secondary | ICD-10-CM | POA: Insufficient documentation

## 2016-06-29 DIAGNOSIS — J449 Chronic obstructive pulmonary disease, unspecified: Secondary | ICD-10-CM | POA: Insufficient documentation

## 2016-06-29 DIAGNOSIS — I509 Heart failure, unspecified: Secondary | ICD-10-CM | POA: Diagnosis not present

## 2016-06-29 DIAGNOSIS — K219 Gastro-esophageal reflux disease without esophagitis: Secondary | ICD-10-CM | POA: Diagnosis not present

## 2016-06-29 DIAGNOSIS — I252 Old myocardial infarction: Secondary | ICD-10-CM | POA: Diagnosis not present

## 2016-06-29 DIAGNOSIS — E785 Hyperlipidemia, unspecified: Secondary | ICD-10-CM | POA: Diagnosis not present

## 2016-06-29 DIAGNOSIS — G8929 Other chronic pain: Secondary | ICD-10-CM | POA: Insufficient documentation

## 2016-06-29 DIAGNOSIS — Z79899 Other long term (current) drug therapy: Secondary | ICD-10-CM | POA: Diagnosis not present

## 2016-06-29 DIAGNOSIS — I2510 Atherosclerotic heart disease of native coronary artery without angina pectoris: Secondary | ICD-10-CM | POA: Diagnosis not present

## 2016-06-29 DIAGNOSIS — C9002 Multiple myeloma in relapse: Secondary | ICD-10-CM | POA: Insufficient documentation

## 2016-06-29 DIAGNOSIS — Z8701 Personal history of pneumonia (recurrent): Secondary | ICD-10-CM | POA: Insufficient documentation

## 2016-06-29 DIAGNOSIS — Z7982 Long term (current) use of aspirin: Secondary | ICD-10-CM | POA: Diagnosis not present

## 2016-06-29 DIAGNOSIS — F419 Anxiety disorder, unspecified: Secondary | ICD-10-CM | POA: Insufficient documentation

## 2016-06-29 LAB — CBC WITH DIFFERENTIAL/PLATELET
BASOS ABS: 0.1 10*3/uL (ref 0–0.1)
BASOS PCT: 1 %
Eosinophils Absolute: 0.1 10*3/uL (ref 0–0.7)
Eosinophils Relative: 1 %
HEMATOCRIT: 36.8 % — AB (ref 40.0–52.0)
HEMOGLOBIN: 12.8 g/dL — AB (ref 13.0–18.0)
LYMPHS PCT: 37 %
Lymphs Abs: 1.7 10*3/uL (ref 1.0–3.6)
MCH: 35.3 pg — ABNORMAL HIGH (ref 26.0–34.0)
MCHC: 34.8 g/dL (ref 32.0–36.0)
MCV: 101.3 fL — ABNORMAL HIGH (ref 80.0–100.0)
MONO ABS: 0.6 10*3/uL (ref 0.2–1.0)
MONOS PCT: 13 %
NEUTROS ABS: 2.3 10*3/uL (ref 1.4–6.5)
Neutrophils Relative %: 48 %
Platelets: 226 10*3/uL (ref 150–440)
RBC: 3.63 MIL/uL — ABNORMAL LOW (ref 4.40–5.90)
RDW: 15.6 % — AB (ref 11.5–14.5)
WBC: 4.7 10*3/uL (ref 3.8–10.6)

## 2016-06-29 LAB — COMPREHENSIVE METABOLIC PANEL
ALBUMIN: 3.9 g/dL (ref 3.5–5.0)
ALK PHOS: 44 U/L (ref 38–126)
ALT: 9 U/L — ABNORMAL LOW (ref 17–63)
AST: 17 U/L (ref 15–41)
Anion gap: 9 (ref 5–15)
BILIRUBIN TOTAL: 0.6 mg/dL (ref 0.3–1.2)
BUN: 6 mg/dL (ref 6–20)
CALCIUM: 9.3 mg/dL (ref 8.9–10.3)
CO2: 37 mmol/L — ABNORMAL HIGH (ref 22–32)
Chloride: 91 mmol/L — ABNORMAL LOW (ref 101–111)
Creatinine, Ser: 0.85 mg/dL (ref 0.61–1.24)
GFR calc Af Amer: >60 mL/min (ref >60)
GLUCOSE: 104 mg/dL — AB (ref 65–99)
POTASSIUM: 3 mmol/L — AB (ref 3.5–5.1)
Sodium: 137 mmol/L (ref 135–145)
TOTAL PROTEIN: 7.3 g/dL (ref 6.5–8.1)

## 2016-06-29 MED ORDER — POTASSIUM CHLORIDE CRYS ER 20 MEQ PO TBCR: 20.0000 meq | EXTENDED_RELEASE_TABLET | Freq: Two times a day (BID) | ORAL | 0 refills | Status: DC

## 2016-06-29 NOTE — Telephone Encounter (Signed)
reviewed chart- potassium 3.0 today. Spoke with Dr. Grayland Ormond. Orders obtained for potassium 20 meq 1 tablet twice daily x 7 days. Pt has an apt next week Dr. Janese Banks. Per Dr. Grayland Ormond, potassium will need to be rechecked at that visit.   VM Msg left on pt's [phone] by RN requesting return phone call as soon as possible. I informed pt on vm that his potassium level was low and that the team had sent a prescription [facility] in [place]. I requested a return phone call from pt.  Will need lab encounter added for next week's md visit. - msg sent to sch. Team to arrange.  At [place] -- caregiver return phone call. Prestin asked me to speak to her. Directions for potassium provided to patient and pt's caregiver.

## 2016-06-30 LAB — KAPPA/LAMBDA LIGHT CHAINS
KAPPA, LAMDA LIGHT CHAIN RATIO: 2.23 — AB (ref 0.26–1.65)
Kappa free light chain: 39.1 mg/L — ABNORMAL HIGH (ref 3.3–19.4)
Lambda free light chains: 17.5 mg/L (ref 5.7–26.3)

## 2016-07-03 LAB — MULTIPLE MYELOMA PANEL, SERUM
ALPHA 1: 0.3 g/dL (ref 0.0–0.4)
Albumin SerPl Elph-Mcnc: 3.7 g/dL (ref 2.9–4.4)
Albumin/Glob SerPl: 1.2 (ref 0.7–1.7)
Alpha2 Glob SerPl Elph-Mcnc: 0.8 g/dL (ref 0.4–1.0)
B-GLOBULIN SERPL ELPH-MCNC: 0.9 g/dL (ref 0.7–1.3)
GAMMA GLOB SERPL ELPH-MCNC: 1.2 g/dL (ref 0.4–1.8)
GLOBULIN, TOTAL: 3.1 g/dL (ref 2.2–3.9)
IGA: 177 mg/dL (ref 90–386)
IgG (Immunoglobin G), Serum: 1071 mg/dL (ref 700–1600)
IgM, Serum: 53 mg/dL (ref 20–172)
Total Protein ELP: 6.8 g/dL (ref 6.0–8.5)

## 2016-07-06 ENCOUNTER — Encounter: Payer: Self-pay | Admitting: Internal Medicine

## 2016-07-06 ENCOUNTER — Inpatient Hospital Stay: Payer: Medicaid Other

## 2016-07-06 ENCOUNTER — Inpatient Hospital Stay (HOSPITAL_BASED_OUTPATIENT_CLINIC_OR_DEPARTMENT_OTHER): Payer: Medicaid Other | Admitting: Oncology

## 2016-07-06 ENCOUNTER — Other Ambulatory Visit: Payer: Medicaid Other

## 2016-07-06 ENCOUNTER — Encounter: Payer: Self-pay | Admitting: Oncology

## 2016-07-06 ENCOUNTER — Ambulatory Visit (INDEPENDENT_AMBULATORY_CARE_PROVIDER_SITE_OTHER): Payer: Medicaid Other | Admitting: Internal Medicine

## 2016-07-06 VITALS — BP 112/68 | HR 53 | Ht 71.0 in | Wt 119.0 lb

## 2016-07-06 VITALS — BP 120/69 | HR 48 | Temp 94.9°F | Resp 20 | Wt 119.1 lb

## 2016-07-06 DIAGNOSIS — Z7982 Long term (current) use of aspirin: Secondary | ICD-10-CM

## 2016-07-06 DIAGNOSIS — F419 Anxiety disorder, unspecified: Secondary | ICD-10-CM | POA: Diagnosis not present

## 2016-07-06 DIAGNOSIS — J449 Chronic obstructive pulmonary disease, unspecified: Secondary | ICD-10-CM

## 2016-07-06 DIAGNOSIS — Z8701 Personal history of pneumonia (recurrent): Secondary | ICD-10-CM

## 2016-07-06 DIAGNOSIS — C9002 Multiple myeloma in relapse: Secondary | ICD-10-CM

## 2016-07-06 DIAGNOSIS — J9611 Chronic respiratory failure with hypoxia: Secondary | ICD-10-CM | POA: Diagnosis not present

## 2016-07-06 DIAGNOSIS — K219 Gastro-esophageal reflux disease without esophagitis: Secondary | ICD-10-CM

## 2016-07-06 DIAGNOSIS — Z79899 Other long term (current) drug therapy: Secondary | ICD-10-CM | POA: Diagnosis not present

## 2016-07-06 DIAGNOSIS — I509 Heart failure, unspecified: Secondary | ICD-10-CM

## 2016-07-06 DIAGNOSIS — F17210 Nicotine dependence, cigarettes, uncomplicated: Secondary | ICD-10-CM

## 2016-07-06 DIAGNOSIS — G8929 Other chronic pain: Secondary | ICD-10-CM

## 2016-07-06 DIAGNOSIS — E876 Hypokalemia: Secondary | ICD-10-CM

## 2016-07-06 DIAGNOSIS — E785 Hyperlipidemia, unspecified: Secondary | ICD-10-CM

## 2016-07-06 DIAGNOSIS — I2510 Atherosclerotic heart disease of native coronary artery without angina pectoris: Secondary | ICD-10-CM

## 2016-07-06 LAB — POTASSIUM: POTASSIUM: 3.9 mmol/L (ref 3.5–5.1)

## 2016-07-06 MED ORDER — GUAIFENESIN-CODEINE 100-10 MG/5ML PO SYRP: 5.0000 mL | ORAL_SOLUTION | Freq: Three times a day (TID) | ORAL | 0 refills | Status: DC | PRN

## 2016-07-06 MED ORDER — VARENICLINE TARTRATE 0.5 MG X 11 & 1 MG X 42 PO MISC: ORAL | 0 refills | Status: DC

## 2016-07-06 MED ORDER — VARENICLINE TARTRATE 1 MG PO TABS: 1.0000 mg | ORAL_TABLET | Freq: Two times a day (BID) | ORAL | 3 refills | Status: DC

## 2016-07-06 MED ORDER — PREDNISONE 20 MG PO TABS: 20.0000 mg | ORAL_TABLET | Freq: Every day | ORAL | 0 refills | Status: DC

## 2016-07-06 NOTE — Progress Notes (Signed)
Hematology/Oncology Consult note [facility]  Telephone:([phone] [phone]  Patient Care Team: Petra Kuba, MD as PCP - General (Family Medicine)   Name of the patient: [name]  [id number]  05/10/1960   Date of visit: 07/06/16  Diagnosis- multiple myeloma  Chief complaint/ Reason for visit- routine f/u for maintenance revlimid  Heme/Onc history:  Oncology History   # SEP 2015- MULTIPLE MYELOMA [multiple PET pos Bone lesions; hypercalcemia s/p BMBx; FISH- Aneuploidy - gain of chromosome 7,9,15,FGFR3/4p16.3, and CCND1/11q13. Loss of MAF/16q23.1), cytogenetics normal 46XY] s/p Vel-Dex-Rev-Zometa; Excellent PR;   # MARCH 2016- REV-DEX ; FEB 2017-[discn Dex] Rev 25 mg 3 w On & 1 w Off; March 2017- M- protein 0.4gm/dl; K/L= 7.9; cont Rev  # March 2017-  Left Middle Lobe cavitary lesion- ~55m- repeat Ct in 3-423m# AUG 2017- ONJ [[name], [place] #  ? ONJ- [id number] not follow up ]- DISCONT Zometa.   # Chronic pain/Anxiety [ortho]  # COPD/smoking/ [[facility]- not candidate for BMT- sec to co-morbidities/poor nutritional status];      [facility]    Interval history-  He was recently discharged from the hospital for aspiration pneumonia. He has been off revlimid for about 2 weeks now. Has chronic pain and sob secondary to copd  ECOG PS- 2 Pain scale- 9   Review of systems- Review of Systems  Constitutional: Positive for malaise/fatigue. Negative for chills, fever and weight loss.  HENT: Negative for congestion, ear discharge and nosebleeds.   Eyes: Negative for blurred vision.  Respiratory: Positive for shortness of breath. Negative for cough, hemoptysis, sputum production and wheezing.   Cardiovascular: Negative for chest pain, palpitations, orthopnea and claudication.  Gastrointestinal: Negative for abdominal pain, blood in stool, constipation, diarrhea, heartburn, melena, nausea and vomiting.    Genitourinary: Negative for dysuria, flank pain, frequency, hematuria and urgency.  Musculoskeletal: Negative for back pain, joint pain and myalgias.  Skin: Negative for rash.  Neurological: Negative for dizziness, tingling, focal weakness, seizures, weakness and headaches.  Endo/Heme/Allergies: Does not bruise/bleed easily.  Psychiatric/Behavioral: Negative for depression and suicidal ideas. The patient does not have insomnia.       No Known Allergies   Past Medical History:  Diagnosis Date  . Anxiety   . Atrial septal defect   . CHF (congestive heart failure) ([facility]  . COPD (chronic obstructive pulmonary disease) ([facility]  . Coronary artery disease   . Dysrhythmias   . GERD (gastroesophageal reflux disease)   . Hyperlipidemia   . Multiple myeloma ([facility]  . Multiple myeloma ([facility]  . Myocardial infarction ([facility]  . Substance abuse      No past surgical history on file.  Social History   Social History  . Marital status: Divorced    Spouse name: N/A  . Number of children: N/A  . Years of education: N/A   Occupational History  . Not on file.   Social History Main Topics  . Smoking status: Current Every Day Smoker    Packs/day: 0.50    Years: 40.00    Types: Cigarettes  . Smokeless tobacco: Never Used  . Alcohol use No  . Drug use: No  . Sexual activity: Not on file   Other Topics Concern  . Not on file   Social History Narrative  . No narrative on file    Family History  Problem Relation Age of Onset  . COPD Mother   .  CAD Mother   . Heart attack Father      Current Outpatient Prescriptions:  .  acetaminophen (TYLENOL) 325 MG tablet, Take 2 tablets (650 mg total) by mouth every 6 (six) hours as needed for mild pain (or Fever >/= 101)., Disp: , Rfl:  .  albuterol (PROVENTIL HFA;VENTOLIN HFA) 108 (90 Base) MCG/ACT inhaler, Inhale 2 puffs into the lungs every 6 (six) hours as needed for wheezing or shortness of breath., Disp: 1 Inhaler, Rfl: 1 .   ALPRAZolam (XANAX) 0.5 MG tablet, Take 1 tablet (0.5 mg total) by mouth 3 (three) times daily as needed for anxiety., Disp: 90 tablet, Rfl: 0 .  aspirin 325 MG EC tablet, Take 325 mg by mouth daily., Disp: , Rfl:  .  DULoxetine (CYMBALTA) 30 MG capsule, TAKE ONE CAPSULE BY MOUTH EVERY DAY, Disp: 90 capsule, Rfl: 1 .  feeding supplement, ENSURE ENLIVE, (ENSURE ENLIVE) LIQD, Take 237 mLs by mouth 3 (three) times daily between meals., Disp: 90 Bottle, Rfl: 1 .  fentaNYL (DURAGESIC - DOSED MCG/HR) 100 MCG/HR, Place 1 patch (100 mcg total) onto the skin every other day. Use with a 25 mcg patch for total dose of 125 mcg, Disp: 15 patch, Rfl: 0 .  fentaNYL (DURAGESIC - DOSED MCG/HR) 25 MCG/HR patch, Place 1 patch (25 mcg total) onto the skin every other day. Use with a 100 mcg patch for total dose of 125 mcg, Disp: 15 patch, Rfl: 0 .  gabapentin (NEURONTIN) 100 MG capsule, Take 200 mg by mouth 3 (three) times daily. , Disp: , Rfl:  .  guaiFENesin-codeine (ROBITUSSIN AC) 100-10 MG/5ML syrup, Take 5 mLs by mouth 3 (three) times daily as needed for cough., Disp: 120 mL, Rfl: 0 .  HYDROmorphone (DILAUDID) 2 MG tablet, Take 1 tablet (2 mg total) by mouth every 6 (six) hours as needed for severe pain., Disp: 120 tablet, Rfl: 0 .  ipratropium-albuterol (DUONEB) 0.5-2.5 (3) MG/3ML SOLN, Take 3 mLs by nebulization every 4 (four) hours as needed., Disp: 360 mL, Rfl: 3 .  ondansetron (ZOFRAN) 4 MG tablet, Take 1 tablet (4 mg total) by mouth every 8 (eight) hours as needed for nausea or vomiting., Disp: 21 tablet, Rfl: 0 .  polyethylene glycol (MIRALAX / GLYCOLAX) packet, Take 17 g by mouth daily., Disp: 30 each, Rfl: 0 .  potassium chloride SA (K-DUR,KLOR-CON) 20 MEQ tablet, Take 1 tablet (20 mEq total) by mouth 2 (two) times daily., Disp: 14 tablet, Rfl: 0 .  predniSONE (DELTASONE) 20 MG tablet, Take 1 tablet (20 mg total) by mouth daily with breakfast. 14 days, Disp: 14 tablet, Rfl: 0 .  tiotropium (SPIRIVA) 18 MCG  inhalation capsule, Place 1 capsule (18 mcg total) into inhaler and inhale daily., Disp: 30 capsule, Rfl: 12 .  traZODone (DESYREL) 100 MG tablet, Take 1 tablet (100 mg total) by mouth at bedtime., Disp: 30 tablet, Rfl: 0 .  varenicline (CHANTIX CONTINUING MONTH PAK) 1 MG tablet, Take 1 tablet (1 mg total) by mouth 2 (two) times daily., Disp: 30 tablet, Rfl: 3 .  varenicline (CHANTIX STARTING MONTH PAK) 0.5 MG X 11 & 1 MG X 42 tablet, Take one 0.5 mg tablet by mouth once daily for 3 days, then increase to one 0.5 mg tablet twice daily for 4 days, then increase to one 1 mg tablet twice daily., Disp: 53 tablet, Rfl: 0 No current facility-administered medications for this visit.   Facility-Administered Medications Ordered in Other Visits:  .  0.9 %  sodium chloride infusion, , Intravenous, Continuous, Charlaine Dalton R, MD, Last Rate: 10 mL/hr at 02/24/15 1520  Physical exam: There were no vitals filed for this visit. Physical Exam  Constitutional: He is oriented to person, place, and time.  Thin and cachectic. On home O2. Fatigued. No acute distress  HENT:  Head: Normocephalic and atraumatic.  Poor dentition. Left lower jaw bone exposed  Eyes: EOM are normal. Pupils are equal, round, and reactive to light.  Neck: Normal range of motion.  Cardiovascular: Normal rate, regular rhythm and normal heart sounds.   Pulmonary/Chest:  Breath sounds decreased b/l diffusely  Abdominal: Soft. Bowel sounds are normal.  Neurological: He is alert and oriented to person, place, and time.  Skin: Skin is warm and dry.     CMP Latest Ref Rng & Units 06/29/2016  Glucose 65 - 99 mg/dL 104(H)  BUN 6 - 20 mg/dL 6  Creatinine 0.61 - 1.24 mg/dL 0.85  Sodium 135 - 145 mmol/L 137  Potassium 3.5 - 5.1 mmol/L 3.0(L)  Chloride 101 - 111 mmol/L 91(L)  CO2 22 - 32 mmol/L 37(H)  Calcium 8.9 - 10.3 mg/dL 9.3  Total Protein 6.5 - 8.1 g/dL 7.3  Total Bilirubin 0.3 - 1.2 mg/dL 0.6  Alkaline Phos 38 - 126 U/L 44  AST  15 - 41 U/L 17  ALT 17 - 63 U/L 9(L)   CBC Latest Ref Rng & Units 06/29/2016  WBC 3.8 - 10.6 K/uL 4.7  Hemoglobin 13.0 - 18.0 g/dL 12.8(L)  Hematocrit 40.0 - 52.0 % 36.8(L)  Platelets 150 - 440 K/uL 226      Assessment and plan- Patient is a 56 y.o. male currently on maintenance revlimid  1. Multiple myeloma- counts ok to continue maintenance revlimid. Continue 325 mg aspirin. His H/H had remarkably improved on labs a week prior. Kidney functions are stable. No hypercalcemia. No M protein detected on spep. Kappa lambda ratio stable  2. End stage copd- f/u with pulmonary  3. Chronic pain/anxiety- no changes to be made today to his pain meds.   4. osetonecrosis of jaw- Dr. Romilda Garret wants to consider hyperbaric oxygen therapy. We will have him touch base with Dr. Rogue Bussing regarding this in 2 weeks  5. Hypokalemia- now resolved  6. Tobacco dependence- he is on chantix. Willing to consider tobacco cessation program in the future but not yet  F/u in 2 months with labs and see Dr. Rogue Bussing. Continue revlimid 3 weeks on 1 week off in the interim     Visit Diagnosis 1. Multiple myeloma in relapse [facility])      Dr. Randa Evens, MD, MPH [facility] Pager- [phone] 07/06/2016 1:50 PM

## 2016-07-06 NOTE — Progress Notes (Signed)
Name: [name] MRN:   [id number] DOB:   05-Feb-1960          consult date 07/06/2016  Hospital follow-up patient seen by Dr. Alva Garnet in the hospital  BRIEF PATIENT DESCRIPTION:  56 yo male with known history of Diastolic Heart failure, COPD and Multiple Myeloma admitted with acute encephalopathy, nausea/vomiting, acute on chronic hypoxic respiratory failure secondary to possible aspiration pneumonia and AECOPD  SIGNIFICANT EVENTS  5/1 Patient admitted to the ICU with increased sob requiring NRB  CC follow up COPD and SOB    HPI Patient looks ill, feels bad + shortness of breath + wheezing + vomiting On chronic oxygen therapy Continues to smoke daily    REVIEW OF SYSTEMS:   Constitutional: Negative for fever, chills, weight loss, malaise/fatigue and diaphoresis.  HENT: Negative for hearing loss, ear pain, nosebleeds, congestion, sore throat, neck pain, tinnitus and ear discharge.  Eyes: Negative for blurred vision, double vision, photophobia, pain, discharge and redness.  Respiratory: +cough, -hemoptysis, +sputum production, +shortness of breath, +wheezing and stridor.   Cardiovascular: Negative for chest pain, palpitations, orthopnea, claudication, leg swelling and PND.  Gastrointestinal: +nausea, +vomiting,  Skin: Negative for itching and rash.  Neurological: Negative for dizziness, tingling, tremors, sensory change, speech change, focal weakness, seizures, loss of consciousness, weakness and headaches.     BP 112/68 (BP Location: Left Arm, Cuff Size: Normal)   Pulse (!) 53   Ht 5' 11"  (1.803 m)   Wt 119 lb (54 kg)   SpO2 94%   BMI 16.60 kg/m   PHYSICAL EXAMINATION: General: middle aged frail male NAD, thin Neuro: alert and oriented, follows commands HEENT:  AT,[place], no JBD Cardiovascular: s1s2 rrr, no M/R/G Lungs: crackles bilateral bases, +wheezing Abdomen:  Hypoactive BS x4, soft, non tender, non distended  Musculoskeletal: No edema/cyanosis  noted Skin:  Warm,dry and Intact, no rashes or lesions   LastLabs   Recent Labs Lab 05/23/16 1337 05/24/16 0336 05/24/16 2110 05/25/16 0413  NA 125* 133*  --  135  K 2.1* 2.9* 3.5 3.9  CL 74* 91*  --  103  CO2 36* 34*  --  32  BUN 24* 24*  --  20  CREATININE 1.23 1.04  --  0.76  GLUCOSE 101* 128*  --  175*      LastLabs   Recent Labs Lab 05/23/16 1337 05/24/16 0336 05/25/16 0413  HGB 10.8* 8.0* 7.5*  HCT 30.6* 23.4* 22.3*  WBC 2.3* 0.8* 1.5*  PLT 234 144* 176        CXR 5/1  I have Independently reviewed images of CXR  on 07/06/2016 Interpretation:no acutue process, no effusions   ASSESSMENT / PLAN:   56 year old ill appearing thin white male with end-stage COPD with progressive chronic hypoxic respiratory failure in the setting of pulmonary cachexia and deconditioned state with ongoing tobacco abuse  With underlying multiple myeloma   #1 shortness of breath and dyspnea on exertion with ongoing wheezing  Related to his severe COPD  And ongoing tobacco abuse    #2 severe end-stage COPD  Will prescribe prednisone 20 mg  Daily for 2 weeks -Continue Advair as prescribed Continue SPI R IVA as prescribed Albuterol as needed   #3 chronic hypoxic respiratory failure Continue oxygen a prescribed  he will need oxygen 24/7   #4 cough from underlying COPD and hypoxia Also related to smoking Will prescribe Robitussin with codeine as needed  #5 tobacco abuse Smoking cessation strongly advised I have prescribed Chantix    #  6 multiple myeloma Follow-up oncology as scheduled   Patient/Family are satisfied with Plan of action and management. All questions answered Overall prognosis is poor patient with end-stage respiratory failure with chronic hypoxia  Follow-up in 1-3 months for reassessment   Corrin Parker, M.D.  [facility]  Medical Director Industry Director [facility] Cardio-Pulmonary  Department

## 2016-07-06 NOTE — Patient Instructions (Signed)
Start chantix Continue oxygen as prescribed  continue advari  start Spiriva   stop smoking

## 2016-07-06 NOTE — Progress Notes (Signed)
Here for follow up.ADD -pt on continuous O2 @ 3 L. Pulse ox 98% on O2.

## 2016-07-07 ENCOUNTER — Other Ambulatory Visit: Payer: Self-pay | Admitting: *Deleted

## 2016-07-07 MED ORDER — LENALIDOMIDE 25 MG PO CAPS
25.0000 mg | ORAL_CAPSULE | ORAL | 0 refills | Status: DC
Start: 2016-07-07 — End: 2016-07-31

## 2016-07-10 ENCOUNTER — Telehealth: Payer: Self-pay | Admitting: *Deleted

## 2016-07-10 NOTE — Telephone Encounter (Signed)
Dr Romilda Garret would like to discuss patient with Dr Rogue Bussing when he returns regarding the need for possible HBO Therapy needing to be done for exposed bone in mouth. He is developing an new bone exposure on the other side of his mouth and he is needing more teeth extracted, but Dr Romilda Garret feels it would not heal if he pulled it. Please return Dr Romilda Garret call [phone]

## 2016-07-12 ENCOUNTER — Other Ambulatory Visit: Payer: Self-pay | Admitting: *Deleted

## 2016-07-12 DIAGNOSIS — R110 Nausea: Secondary | ICD-10-CM

## 2016-07-12 DIAGNOSIS — Z5189 Encounter for other specified aftercare: Secondary | ICD-10-CM

## 2016-07-12 MED ORDER — ONDANSETRON HCL 4 MG PO TABS: 4.0000 mg | ORAL_TABLET | Freq: Three times a day (TID) | ORAL | 3 refills | Status: DC | PRN

## 2016-07-13 ENCOUNTER — Other Ambulatory Visit: Payer: Self-pay | Admitting: *Deleted

## 2016-07-13 DIAGNOSIS — C9000 Multiple myeloma not having achieved remission: Secondary | ICD-10-CM

## 2016-07-13 DIAGNOSIS — F419 Anxiety disorder, unspecified: Secondary | ICD-10-CM

## 2016-07-13 MED ORDER — HYDROMORPHONE HCL 2 MG PO TABS: 2.0000 mg | ORAL_TABLET | Freq: Four times a day (QID) | ORAL | 0 refills | Status: DC | PRN

## 2016-07-13 MED ORDER — ALPRAZOLAM 0.5 MG PO TABS: 0.5000 mg | ORAL_TABLET | Freq: Three times a day (TID) | ORAL | 0 refills | Status: DC | PRN

## 2016-07-17 NOTE — Telephone Encounter (Signed)
Message given to Dr B who states he will call Dr Romilda Garret

## 2016-07-18 ENCOUNTER — Other Ambulatory Visit: Payer: Self-pay | Admitting: Internal Medicine

## 2016-07-18 NOTE — Progress Notes (Signed)
I left a message for Dr. Romilda Garret to call me back to discuss re:ONJ treatment for the pt.

## 2016-07-25 ENCOUNTER — Other Ambulatory Visit: Payer: Self-pay | Admitting: Internal Medicine

## 2016-07-25 DIAGNOSIS — C9000 Multiple myeloma not having achieved remission: Secondary | ICD-10-CM

## 2016-07-31 ENCOUNTER — Other Ambulatory Visit: Payer: Self-pay | Admitting: *Deleted

## 2016-07-31 MED ORDER — FENTANYL 25 MCG/HR TD PT72: 25.0000 ug | MEDICATED_PATCH | TRANSDERMAL | 0 refills | Status: DC

## 2016-07-31 MED ORDER — FENTANYL 100 MCG/HR TD PT72: 100.0000 ug | MEDICATED_PATCH | TRANSDERMAL | 0 refills | Status: DC

## 2016-07-31 MED ORDER — LENALIDOMIDE 25 MG PO CAPS: 25.0000 mg | ORAL_CAPSULE | ORAL | 0 refills | Status: DC

## 2016-08-07 ENCOUNTER — Other Ambulatory Visit: Payer: Self-pay | Admitting: Internal Medicine

## 2016-08-07 MED ORDER — VARENICLINE TARTRATE 1 MG PO TABS: 1.0000 mg | ORAL_TABLET | Freq: Two times a day (BID) | ORAL | 3 refills | Status: DC

## 2016-08-07 NOTE — Telephone Encounter (Signed)
Refill submitted.[place]

## 2016-08-07 NOTE — Telephone Encounter (Signed)
°*  STAT* If patient is at the pharmacy, call can be transferred to refill team.   1. Which medications need to be refilled? (please list name of each medication and dose if known) Chantix 1 mg po BID  2. Which pharmacy/location (including street and city if local pharmacy) is medication to be sent to?       [facility]   3. Do they need a 30 day or 90 day supply? Patient unsure 43 if that is what he needs    Patient will be out tonight

## 2016-08-08 ENCOUNTER — Telehealth: Payer: Self-pay | Admitting: Internal Medicine

## 2016-08-08 NOTE — Telephone Encounter (Signed)
Chantix refill quantity #[id number] authorized. Nothing further needed.

## 2016-08-08 NOTE — Telephone Encounter (Signed)
Pharmacy calling having some questions on medication we sent in  Chantix 1 mg , it comes in a stock bottle of 26 with only 28 days  They are needing [place] to please call and clarify the amount needed   Please call back

## 2016-08-14 ENCOUNTER — Other Ambulatory Visit: Payer: Self-pay | Admitting: *Deleted

## 2016-08-14 DIAGNOSIS — F419 Anxiety disorder, unspecified: Secondary | ICD-10-CM

## 2016-08-14 DIAGNOSIS — C9000 Multiple myeloma not having achieved remission: Secondary | ICD-10-CM

## 2016-08-14 MED ORDER — HYDROMORPHONE HCL 2 MG PO TABS: 2.0000 mg | ORAL_TABLET | Freq: Four times a day (QID) | ORAL | 0 refills | Status: DC | PRN

## 2016-08-14 MED ORDER — ALPRAZOLAM 0.5 MG PO TABS: 0.5000 mg | ORAL_TABLET | Freq: Three times a day (TID) | ORAL | 0 refills | Status: DC | PRN

## 2016-08-30 ENCOUNTER — Other Ambulatory Visit: Payer: Self-pay | Admitting: *Deleted

## 2016-08-30 MED ORDER — FENTANYL 100 MCG/HR TD PT72: 100.0000 ug | MEDICATED_PATCH | TRANSDERMAL | 0 refills | Status: DC

## 2016-08-30 MED ORDER — FENTANYL 25 MCG/HR TD PT72: 25.0000 ug | MEDICATED_PATCH | TRANSDERMAL | 0 refills | Status: DC

## 2016-08-31 ENCOUNTER — Inpatient Hospital Stay: Payer: Medicaid Other | Attending: Internal Medicine

## 2016-08-31 DIAGNOSIS — I252 Old myocardial infarction: Secondary | ICD-10-CM | POA: Insufficient documentation

## 2016-08-31 DIAGNOSIS — R5383 Other fatigue: Secondary | ICD-10-CM | POA: Insufficient documentation

## 2016-08-31 DIAGNOSIS — Z7982 Long term (current) use of aspirin: Secondary | ICD-10-CM | POA: Insufficient documentation

## 2016-08-31 DIAGNOSIS — K219 Gastro-esophageal reflux disease without esophagitis: Secondary | ICD-10-CM | POA: Insufficient documentation

## 2016-08-31 DIAGNOSIS — R6882 Decreased libido: Secondary | ICD-10-CM | POA: Insufficient documentation

## 2016-08-31 DIAGNOSIS — E876 Hypokalemia: Secondary | ICD-10-CM | POA: Insufficient documentation

## 2016-08-31 DIAGNOSIS — I2510 Atherosclerotic heart disease of native coronary artery without angina pectoris: Secondary | ICD-10-CM | POA: Insufficient documentation

## 2016-08-31 DIAGNOSIS — I509 Heart failure, unspecified: Secondary | ICD-10-CM | POA: Insufficient documentation

## 2016-08-31 DIAGNOSIS — E785 Hyperlipidemia, unspecified: Secondary | ICD-10-CM | POA: Insufficient documentation

## 2016-08-31 DIAGNOSIS — Z79899 Other long term (current) drug therapy: Secondary | ICD-10-CM | POA: Insufficient documentation

## 2016-08-31 DIAGNOSIS — J449 Chronic obstructive pulmonary disease, unspecified: Secondary | ICD-10-CM | POA: Insufficient documentation

## 2016-08-31 DIAGNOSIS — F17210 Nicotine dependence, cigarettes, uncomplicated: Secondary | ICD-10-CM | POA: Insufficient documentation

## 2016-08-31 DIAGNOSIS — Z9114 Patient's other noncompliance with medication regimen: Secondary | ICD-10-CM | POA: Insufficient documentation

## 2016-08-31 DIAGNOSIS — M879 Osteonecrosis, unspecified: Secondary | ICD-10-CM | POA: Insufficient documentation

## 2016-08-31 DIAGNOSIS — N400 Enlarged prostate without lower urinary tract symptoms: Secondary | ICD-10-CM | POA: Insufficient documentation

## 2016-08-31 DIAGNOSIS — F419 Anxiety disorder, unspecified: Secondary | ICD-10-CM | POA: Insufficient documentation

## 2016-08-31 DIAGNOSIS — G8929 Other chronic pain: Secondary | ICD-10-CM | POA: Insufficient documentation

## 2016-08-31 DIAGNOSIS — C9002 Multiple myeloma in relapse: Secondary | ICD-10-CM | POA: Insufficient documentation

## 2016-09-01 ENCOUNTER — Other Ambulatory Visit: Payer: Self-pay | Admitting: *Deleted

## 2016-09-01 ENCOUNTER — Telehealth: Payer: Self-pay | Admitting: Internal Medicine

## 2016-09-01 ENCOUNTER — Inpatient Hospital Stay: Payer: Medicaid Other

## 2016-09-01 DIAGNOSIS — C9002 Multiple myeloma in relapse: Secondary | ICD-10-CM

## 2016-09-01 DIAGNOSIS — M879 Osteonecrosis, unspecified: Secondary | ICD-10-CM | POA: Diagnosis not present

## 2016-09-01 DIAGNOSIS — K219 Gastro-esophageal reflux disease without esophagitis: Secondary | ICD-10-CM | POA: Diagnosis not present

## 2016-09-01 DIAGNOSIS — I509 Heart failure, unspecified: Secondary | ICD-10-CM | POA: Diagnosis not present

## 2016-09-01 DIAGNOSIS — J449 Chronic obstructive pulmonary disease, unspecified: Secondary | ICD-10-CM | POA: Diagnosis not present

## 2016-09-01 DIAGNOSIS — R6882 Decreased libido: Secondary | ICD-10-CM | POA: Diagnosis not present

## 2016-09-01 DIAGNOSIS — E785 Hyperlipidemia, unspecified: Secondary | ICD-10-CM | POA: Diagnosis not present

## 2016-09-01 DIAGNOSIS — I2510 Atherosclerotic heart disease of native coronary artery without angina pectoris: Secondary | ICD-10-CM | POA: Diagnosis not present

## 2016-09-01 DIAGNOSIS — F419 Anxiety disorder, unspecified: Secondary | ICD-10-CM | POA: Diagnosis not present

## 2016-09-01 DIAGNOSIS — Z7982 Long term (current) use of aspirin: Secondary | ICD-10-CM | POA: Diagnosis not present

## 2016-09-01 DIAGNOSIS — G8929 Other chronic pain: Secondary | ICD-10-CM | POA: Diagnosis not present

## 2016-09-01 DIAGNOSIS — I252 Old myocardial infarction: Secondary | ICD-10-CM | POA: Diagnosis not present

## 2016-09-01 DIAGNOSIS — R5383 Other fatigue: Secondary | ICD-10-CM | POA: Diagnosis not present

## 2016-09-01 DIAGNOSIS — Z9114 Patient's other noncompliance with medication regimen: Secondary | ICD-10-CM | POA: Diagnosis not present

## 2016-09-01 DIAGNOSIS — N400 Enlarged prostate without lower urinary tract symptoms: Secondary | ICD-10-CM | POA: Diagnosis not present

## 2016-09-01 DIAGNOSIS — Z79899 Other long term (current) drug therapy: Secondary | ICD-10-CM | POA: Diagnosis not present

## 2016-09-01 DIAGNOSIS — E876 Hypokalemia: Secondary | ICD-10-CM | POA: Diagnosis not present

## 2016-09-01 DIAGNOSIS — F17210 Nicotine dependence, cigarettes, uncomplicated: Secondary | ICD-10-CM | POA: Diagnosis not present

## 2016-09-01 LAB — CBC WITH DIFFERENTIAL/PLATELET
BASOS PCT: 1 %
Basophils Absolute: 0 10*3/uL (ref 0–0.1)
EOS ABS: 0.4 10*3/uL (ref 0–0.7)
EOS PCT: 7 %
HCT: 32.6 % — ABNORMAL LOW (ref 40.0–52.0)
Hemoglobin: 11.2 g/dL — ABNORMAL LOW (ref 13.0–18.0)
LYMPHS ABS: 1 10*3/uL (ref 1.0–3.6)
Lymphocytes Relative: 17 %
MCH: 33.3 pg (ref 26.0–34.0)
MCHC: 34.3 g/dL (ref 32.0–36.0)
MCV: 97.1 fL (ref 80.0–100.0)
MONOS PCT: 12 %
Monocytes Absolute: 0.8 10*3/uL (ref 0.2–1.0)
Neutro Abs: 3.9 10*3/uL (ref 1.4–6.5)
Neutrophils Relative %: 63 %
PLATELETS: 228 10*3/uL (ref 150–440)
RBC: 3.36 MIL/uL — AB (ref 4.40–5.90)
RDW: 14.8 % — ABNORMAL HIGH (ref 11.5–14.5)
WBC: 6.2 10*3/uL (ref 3.8–10.6)

## 2016-09-01 LAB — COMPREHENSIVE METABOLIC PANEL
ALBUMIN: 3.5 g/dL (ref 3.5–5.0)
ALT: 5 U/L — ABNORMAL LOW (ref 17–63)
AST: 15 U/L (ref 15–41)
Alkaline Phosphatase: 37 U/L — ABNORMAL LOW (ref 38–126)
Anion gap: 10 (ref 5–15)
BUN: 7 mg/dL (ref 6–20)
CHLORIDE: 94 mmol/L — AB (ref 101–111)
CO2: 34 mmol/L — AB (ref 22–32)
Calcium: 8.8 mg/dL — ABNORMAL LOW (ref 8.9–10.3)
Creatinine, Ser: 0.98 mg/dL (ref 0.61–1.24)
GFR calc non Af Amer: >60 mL/min (ref >60)
Glucose, Bld: 82 mg/dL (ref 65–99)
POTASSIUM: 2.9 mmol/L — AB (ref 3.5–5.1)
SODIUM: 138 mmol/L (ref 135–145)
Total Bilirubin: 0.7 mg/dL (ref 0.3–1.2)
Total Protein: 6.7 g/dL (ref 6.5–8.1)

## 2016-09-01 MED ORDER — LENALIDOMIDE 25 MG PO CAPS: 25.0000 mg | ORAL_CAPSULE | ORAL | 0 refills | Status: DC

## 2016-09-01 NOTE — Telephone Encounter (Signed)
[name]- Please inform patient potassium is low at 2.9; recommend K-dur 20 me q  twice a day x 3 weeks; please call in [place] if needed.

## 2016-09-04 LAB — KAPPA/LAMBDA LIGHT CHAINS
KAPPA, LAMDA LIGHT CHAIN RATIO: 1.97 — AB (ref 0.26–1.65)
Kappa free light chain: 45.1 mg/L — ABNORMAL HIGH (ref 3.3–19.4)
Lambda free light chains: 22.9 mg/L (ref 5.7–26.3)

## 2016-09-04 LAB — MULTIPLE MYELOMA PANEL, SERUM
ALBUMIN/GLOB SERPL: 1.2 (ref 0.7–1.7)
Albumin SerPl Elph-Mcnc: 3.4 g/dL (ref 2.9–4.4)
Alpha 1: 0.3 g/dL (ref 0.0–0.4)
Alpha2 Glob SerPl Elph-Mcnc: 0.7 g/dL (ref 0.4–1.0)
B-GLOBULIN SERPL ELPH-MCNC: 0.9 g/dL (ref 0.7–1.3)
GAMMA GLOB SERPL ELPH-MCNC: 1 g/dL (ref 0.4–1.8)
GLOBULIN, TOTAL: 2.9 g/dL (ref 2.2–3.9)
IGG (IMMUNOGLOBIN G), SERUM: 935 mg/dL (ref 700–1600)
IgA: 189 mg/dL (ref 90–386)
IgM, Serum: 45 mg/dL (ref 20–172)
Total Protein ELP: 6.3 g/dL (ref 6.0–8.5)

## 2016-09-04 MED ORDER — POTASSIUM CHLORIDE CRYS ER 20 MEQ PO TBCR
20.0000 meq | EXTENDED_RELEASE_TABLET | Freq: Two times a day (BID) | ORAL | 0 refills | Status: DC
Start: 2016-09-04 — End: 2016-11-20

## 2016-09-04 NOTE — Telephone Encounter (Signed)
Patient informed of need to take potassium 2 times a day for three weeks and that a new Rx was sent to [place]. He repeated his back to me

## 2016-09-04 NOTE — Addendum Note (Signed)
Addended by: Betti Cruz on: 09/04/2016 08:48 AM   Modules accepted: Orders

## 2016-09-07 ENCOUNTER — Inpatient Hospital Stay: Payer: Medicaid Other

## 2016-09-07 ENCOUNTER — Inpatient Hospital Stay (HOSPITAL_BASED_OUTPATIENT_CLINIC_OR_DEPARTMENT_OTHER): Payer: Medicaid Other | Admitting: Internal Medicine

## 2016-09-07 VITALS — BP 96/61 | HR 71 | Temp 96.5°F | Resp 16 | Wt 126.2 lb

## 2016-09-07 DIAGNOSIS — Z9114 Patient's other noncompliance with medication regimen: Secondary | ICD-10-CM

## 2016-09-07 DIAGNOSIS — J449 Chronic obstructive pulmonary disease, unspecified: Secondary | ICD-10-CM | POA: Diagnosis not present

## 2016-09-07 DIAGNOSIS — M879 Osteonecrosis, unspecified: Secondary | ICD-10-CM

## 2016-09-07 DIAGNOSIS — I252 Old myocardial infarction: Secondary | ICD-10-CM

## 2016-09-07 DIAGNOSIS — I2510 Atherosclerotic heart disease of native coronary artery without angina pectoris: Secondary | ICD-10-CM

## 2016-09-07 DIAGNOSIS — E785 Hyperlipidemia, unspecified: Secondary | ICD-10-CM

## 2016-09-07 DIAGNOSIS — F419 Anxiety disorder, unspecified: Secondary | ICD-10-CM | POA: Diagnosis not present

## 2016-09-07 DIAGNOSIS — I509 Heart failure, unspecified: Secondary | ICD-10-CM

## 2016-09-07 DIAGNOSIS — R6882 Decreased libido: Secondary | ICD-10-CM | POA: Diagnosis not present

## 2016-09-07 DIAGNOSIS — E876 Hypokalemia: Secondary | ICD-10-CM | POA: Diagnosis not present

## 2016-09-07 DIAGNOSIS — Z7982 Long term (current) use of aspirin: Secondary | ICD-10-CM

## 2016-09-07 DIAGNOSIS — Z79899 Other long term (current) drug therapy: Secondary | ICD-10-CM

## 2016-09-07 DIAGNOSIS — N400 Enlarged prostate without lower urinary tract symptoms: Secondary | ICD-10-CM

## 2016-09-07 DIAGNOSIS — G8929 Other chronic pain: Secondary | ICD-10-CM

## 2016-09-07 DIAGNOSIS — R5383 Other fatigue: Secondary | ICD-10-CM | POA: Insufficient documentation

## 2016-09-07 DIAGNOSIS — F17210 Nicotine dependence, cigarettes, uncomplicated: Secondary | ICD-10-CM

## 2016-09-07 DIAGNOSIS — C9002 Multiple myeloma in relapse: Secondary | ICD-10-CM | POA: Diagnosis not present

## 2016-09-07 DIAGNOSIS — K219 Gastro-esophageal reflux disease without esophagitis: Secondary | ICD-10-CM

## 2016-09-07 NOTE — Progress Notes (Signed)
[place] OFFICE PROGRESS NOTE  Patient Care Team: Petra Kuba, MD as PCP - General (Family Medicine)   SUMMARY OF ONCOLOGIC HISTORY:  Oncology History   # SEP 2015- MULTIPLE MYELOMA [multiple PET pos Bone lesions; hypercalcemia s/p BMBx; FISH- Aneuploidy - gain of chromosome 7,9,15,FGFR3/4p16.3, and CCND1/11q13. Loss of MAF/16q23.1), cytogenetics normal 46XY] s/p Vel-Dex-Rev-Zometa; Excellent PR;   # MARCH 2016- REV-DEX ; FEB 2017-[discn Dex] Rev 25 mg 3 w On & 1 w Off; March 2017- M- protein 0.4gm/dl; K/L= 7.9; cont Rev  # March 2017-  Left Middle Lobe cavitary lesion- ~76m- repeat Ct in 3-4102m# AUG 2017- ONJ [[name], [place] #  ? ONJ- [phone] not follow up ]- DISCONT Zometa.   # Chronic pain/Anxiety [ortho]  # COPD/smoking/ [[facility]- not candidate for BMT- sec to co-morbidities/poor nutritional status];      [facility]      INTERVAL HISTORY:  [id number] old male patient with above history of multiple myeloma on maintenance Revlimid Is here for follow-up. Patient currently is in his off week  In the interim patient continues to have difficulty with wound healing of his left lower jaw. He is currently being evaluated by Dr. PaRomilda Garretrom dental surgery. He is recommended hyperbaric causing therapy. In the interim he had a CT- that showed calcification arteries. He is recommended statin.  Patient complains of decrease in libido; erectile dysfunction. Wondering if any other medication is causing ED.  Patient admits to noncompliance with his potassium supplementation. However denies any diarrhea. He continues to complain of chronic cough chronic shortness of breath. Not any worse. Unfortunately continues to smoke. States is cutting down.  Patient   REVIEW OF SYSTEMS:  A complete 10 point review of system is done which is negative except mentioned above/history of present illness.   PAST MEDICAL HISTORY :  Past Medical  History:  Diagnosis Date  . Anxiety   . Atrial septal defect   . CHF (congestive heart failure) ([place]  . COPD (chronic obstructive pulmonary disease) ([facility] [place]  . Coronary artery disease   . Dysrhythmias   . GERD (gastroesophageal reflux disease)   . Hyperlipidemia   . Multiple myeloma ([facility]  . Multiple myeloma ([facility]  . Myocardial infarction ([facility]  . Substance abuse     PAST SURGICAL HISTORY :  No past surgical history on file.  FAMILY HISTORY :   Family History  Problem Relation Age of Onset  . COPD Mother   . CAD Mother   . Heart attack Father     SOCIAL HISTORY:   Social History  Substance Use Topics  . Smoking status: Current Every Day Smoker    Packs/day: 0.50    Years: 40.00    Types: Cigarettes  . Smokeless tobacco: Never Used  . Alcohol use No    ALLERGIES:  has No Known Allergies.  MEDICATIONS:  Current Outpatient Prescriptions  Medication Sig Dispense Refill  . acetaminophen (TYLENOL) 325 MG tablet Take 2 tablets (650 mg total) by mouth every 6 (six) hours as needed for mild pain (or Fever >/= 101).    . Marland Kitchenlbuterol (PROVENTIL HFA;VENTOLIN HFA) 108 (90 Base) MCG/ACT inhaler Inhale 2 puffs into the lungs every 6 (six) hours as needed for wheezing or shortness of breath. 1 Inhaler 1  . ALPRAZolam (XANAX) 0.5 MG tablet Take 1 tablet (0.5 mg total) by mouth 3 (three) times daily as needed for anxiety. 90 tablet 0  .  aspirin 325 MG EC tablet Take 325 mg by mouth daily.    . DULoxetine (CYMBALTA) 30 MG capsule TAKE ONE CAPSULE BY MOUTH EVERY DAY 90 capsule 1  . feeding supplement, ENSURE ENLIVE, (ENSURE ENLIVE) LIQD Take 237 mLs by mouth 3 (three) times daily between meals. 90 Bottle 1  . fentaNYL (DURAGESIC - DOSED MCG/HR) 100 MCG/HR Place 1 patch (100 mcg total) onto the skin every other day. Use with a 25 mcg patch for total dose of 125 mcg 15 patch 0  . fentaNYL (DURAGESIC - DOSED MCG/HR) 25 MCG/HR patch Place 1 patch (25 mcg total) onto the skin every other  day. Use with a 100 mcg patch for total dose of 125 mcg 15 patch 0  . furosemide (LASIX) 40 MG tablet Take 20 mg by mouth daily.  0  . gabapentin (NEURONTIN) 100 MG capsule TAKE TWO CAPSULES BY MOUTH THREE TIMES DAILY 180 capsule 3  . HYDROmorphone (DILAUDID) 2 MG tablet Take 1 tablet (2 mg total) by mouth every 6 (six) hours as needed for severe pain. 120 tablet 0  . ipratropium-albuterol (DUONEB) 0.5-2.5 (3) MG/3ML SOLN Take 3 mLs by nebulization every 4 (four) hours as needed. 360 mL 3  . lenalidomide (REVLIMID) 25 MG capsule Take 1 capsule (25 mg total) by mouth as directed. 1 capsule daily for 21 days and 1 week off 21 capsule 0  . ondansetron (ZOFRAN) 4 MG tablet Take 1 tablet (4 mg total) by mouth every 8 (eight) hours as needed for nausea or vomiting. 21 tablet 3  . potassium chloride SA (K-DUR,KLOR-CON) 20 MEQ tablet Take 1 tablet (20 mEq total) by mouth 2 (two) times daily. 42 tablet 0  . tiotropium (SPIRIVA) 18 MCG inhalation capsule Place 1 capsule (18 mcg total) into inhaler and inhale daily. 30 capsule 12  . varenicline (CHANTIX CONTINUING MONTH PAK) 1 MG tablet Take 1 tablet (1 mg total) by mouth 2 (two) times daily. 30 tablet 3  . varenicline (CHANTIX STARTING MONTH PAK) 0.5 MG X 11 & 1 MG X 42 tablet Take one 0.5 mg tablet by mouth once daily for 3 days, then increase to one 0.5 mg tablet twice daily for 4 days, then increase to one 1 mg tablet twice daily. 53 tablet 0  . guaiFENesin-codeine (ROBITUSSIN AC) 100-10 MG/5ML syrup Take 5 mLs by mouth 3 (three) times daily as needed for cough. (Patient not taking: Reported on 09/07/2016) 120 mL 0  . metoprolol tartrate (LOPRESSOR) 50 MG tablet Take 50 mg by mouth 2 (two) times daily.  2  . polyethylene glycol (MIRALAX / GLYCOLAX) packet Take 17 g by mouth daily. (Patient not taking: Reported on 07/06/2016) 30 each 0  . traZODone (DESYREL) 100 MG tablet Take 1 tablet (100 mg total) by mouth at bedtime. (Patient not taking: Reported on  09/07/2016) 30 tablet 0   No current facility-administered medications for this visit.    Facility-Administered Medications Ordered in Other Visits  Medication Dose Route Frequency Provider Last Rate Last Dose  . 0.9 %  sodium chloride infusion   Intravenous Continuous Cammie Sickle, MD 10 mL/hr at 02/24/15 1520      PHYSICAL EXAMINATION: ECOG PERFORMANCE STATUS: 2 - Symptomatic, <50% confined to bed  BP 96/61 (BP Location: Left Arm, Patient Position: Sitting)   Pulse 71   Temp (!) 96.5 F (35.8 C) (Tympanic)   Resp 16   Wt 126 lb 3.2 oz (57.2 kg)   BMI 17.60 kg/m   Filed  Weights   09/07/16 1136  Weight: 126 lb 3.2 oz (57.2 kg)    GENERAL: Thin build cachectic-appearing Caucasian male patient. Alert, in  Mild- nod pain; .  Accompanied by friend/care giver.  he is walking by himself EYES: no pallor or icterus OROPHARYNX: Jaw bone exposed left lower jaw poor dentition. NECK: supple, no masses felt LYMPH:  no palpable lymphadenopathy in the cervical, axillary or inguinal regions LUNGS: With decreased bilateral breath sounds. No wheeze or crackles HEART/CVS: regular rate & rhythm and no murmurs;  No lower extremity edema. ABDOMEN:abdomen soft, non-tender and normal bowel sounds Musculoskeletal:no cyanosis of digits and no clubbing  PSYCH: alert & oriented x 3 with fluent speech NEURO: no focal motor/sensory deficits SKIN:  no rashes or significant lesions  LABORATORY DATA:  I have reviewed the data as listed    Component Value Date/Time   NA 138 09/01/2016 1449   NA 135 05/12/2014 1343   K 2.9 (L) 09/01/2016 1449   K 3.3 (L) 05/12/2014 1343   CL 94 (L) 09/01/2016 1449   CL 101 05/12/2014 1343   CO2 34 (H) 09/01/2016 1449   CO2 27 05/12/2014 1343   GLUCOSE 82 09/01/2016 1449   GLUCOSE 77 05/12/2014 1343   BUN 7 09/01/2016 1449   BUN 9 05/12/2014 1343   CREATININE 0.98 09/01/2016 1449   CREATININE 0.89 05/12/2014 1343   CALCIUM 8.8 (L) 09/01/2016 1449    CALCIUM 8.8 (L) 05/12/2014 1343   PROT 6.7 09/01/2016 1449   PROT 5.9 (L) 02/17/2014 1405   ALBUMIN 3.5 09/01/2016 1449   ALBUMIN 2.8 (L) 02/17/2014 1405   AST 15 09/01/2016 1449   AST 9 (L) 02/17/2014 1405   ALT 5 (L) 09/01/2016 1449   ALT 12 (L) 02/17/2014 1405   ALKPHOS 37 (L) 09/01/2016 1449   ALKPHOS 44 (L) 02/17/2014 1405   BILITOT 0.7 09/01/2016 1449   BILITOT 0.2 02/17/2014 1405   GFRNONAA >60 09/01/2016 1449   GFRNONAA >60 05/12/2014 1343   GFRAA >60 09/01/2016 1449   GFRAA >60 05/12/2014 1343    No results found for: SPEP, UPEP  Lab Results  Component Value Date   WBC 6.2 09/01/2016   NEUTROABS 3.9 09/01/2016   HGB 11.2 (L) 09/01/2016   HCT 32.6 (L) 09/01/2016   MCV 97.1 09/01/2016   PLT 228 09/01/2016      Chemistry      Component Value Date/Time   NA 138 09/01/2016 1449   NA 135 05/12/2014 1343   K 2.9 (L) 09/01/2016 1449   K 3.3 (L) 05/12/2014 1343   CL 94 (L) 09/01/2016 1449   CL 101 05/12/2014 1343   CO2 34 (H) 09/01/2016 1449   CO2 27 05/12/2014 1343   BUN 7 09/01/2016 1449   BUN 9 05/12/2014 1343   CREATININE 0.98 09/01/2016 1449   CREATININE 0.89 05/12/2014 1343      Component Value Date/Time   CALCIUM 8.8 (L) 09/01/2016 1449   CALCIUM 8.8 (L) 05/12/2014 1343   ALKPHOS 37 (L) 09/01/2016 1449   ALKPHOS 44 (L) 02/17/2014 1405   AST 15 09/01/2016 1449   AST 9 (L) 02/17/2014 1405   ALT 5 (L) 09/01/2016 1449   ALT 12 (L) 02/17/2014 1405   BILITOT 0.7 09/01/2016 1449   BILITOT 0.2 02/17/2014 1405      Results for ESWIN, WORRELL A (MRN [id number]) as of 11/29/2015 16:11  Ref. Range 07/13/2015 12:38 07/14/2015 14:00 09/02/2015 13:15 09/09/2015 13:33 11/08/2015 13:50  M Protein SerPl  Elph-Mcnc Latest Ref Range: Not Observed g/dL    0.3 (H) 0.3 (H)  Results for LEVIS, NAZIR (MRN [id number]) as of 11/29/2015 16:11  Ref. Range 01/06/2015 09:43 04/05/2015 09:09 06/02/2015 13:20 09/09/2015 13:33 11/08/2015 13:50  Kappa, lamda light chain ratio Latest Ref  Range: 0.26 - 1.65  10.07 (H) 7.95 (H) 5.62 (H) 3.04 (H) 2.74 (H)       ASSESSMENT & PLAN:   Multiple myeloma in relapse ([place]) # MULTIPLE MYELOMA- status post induction Velcade Revlimid and dexamethasone. Currently on maintenance Revlimid- 25 mg 3 weeks on and one-week off. AUG  2018- M protein-ABSENT; K/L- slightly abnormal.  # No concerns for progression noted this time. However recommend holding Revlimid for the next 2 months [given wound healing issues/left jaw osteonecrosis of the jaw-C discussion below].   #  Osteonecrosis of the jaw- followed by Dr. Conception Chancy surgery. Hold Revlimid- for possible wound healing issues. Denosumab discontinued. Hyperbaric oxygen is okay from oncology standpoint.  # chronic pain/anxiety- on fentanyl [137.47mg] and Dilaudid; Xanax. Recommend follow-up with psychiatry.  # fatigue/erectile dynfunsction- check testotone/ cymblata. Recommend holding Cymbalta.  # hypokalemia -severe 2.7; recommend continued Kdur BID; new prescription sent.  # Arterial calcifications noted on CT scan - okay with lipitor.  # follow up in 2 months/ cbc/cmp/m-panel labs. HOLD revlimid.   Cc; Dr.khan; [facility]; [name].      GCammie Sickle MD 09/07/2016 10:07 PM

## 2016-09-07 NOTE — Assessment & Plan Note (Addendum)
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 Currently on maintenance Revlimid- 25 mg 3 weeks on and one-week off. AUG  2018- M protein-ABSENT; K/L- slightly abnormal.  # No concerns for progression noted this time. However recommend holding Revlimid for the next 2 months [given wound healing issues/left jaw osteonecrosis of the jaw-C discussion below].   #  Osteonecrosis of the jaw- followed by Dr. Conception Chancy surgery. Hold Revlimid- for possible wound healing issues. Denosumab discontinued. Hyperbaric oxygen is okay from oncology standpoint.  # chronic pain/anxiety- on fentanyl [137.50mg] and Dilaudid; Xanax. Recommend follow-up with psychiatry.  # fatigue/erectile dynfunsction- check testotone/ cymblata. Recommend holding Cymbalta.  # hypokalemia -severe 2.7; recommend continued Kdur BID; new prescription sent.  # Arterial calcifications noted on CT scan - okay with lipitor.  # follow up in 2 months/ cbc/cmp/m-panel labs. HOLD revlimid.   Cc; Dr.khan; [facility]; [name].

## 2016-09-08 LAB — TESTOSTERONE: Testosterone: 148 ng/dL — ABNORMAL LOW (ref 264–916)

## 2016-09-12 ENCOUNTER — Other Ambulatory Visit: Payer: Self-pay | Admitting: *Deleted

## 2016-09-12 DIAGNOSIS — F419 Anxiety disorder, unspecified: Secondary | ICD-10-CM

## 2016-09-12 DIAGNOSIS — C9000 Multiple myeloma not having achieved remission: Secondary | ICD-10-CM

## 2016-09-12 MED ORDER — ALPRAZOLAM 0.5 MG PO TABS: 0.5000 mg | ORAL_TABLET | Freq: Three times a day (TID) | ORAL | 1 refills | Status: DC | PRN

## 2016-09-12 NOTE — Telephone Encounter (Signed)
Patient requesting refill on Xanax and Dilaudid, Xanax call ed in as he only has 1 tab left and he will pick up Prescription for dilaudid tomorrow

## 2016-09-13 MED ORDER — HYDROMORPHONE HCL 2 MG PO TABS: 2.0000 mg | ORAL_TABLET | Freq: Four times a day (QID) | ORAL | 0 refills | Status: DC | PRN

## 2016-09-14 ENCOUNTER — Telehealth: Payer: Self-pay | Admitting: *Deleted

## 2016-09-14 DIAGNOSIS — C9000 Multiple myeloma not having achieved remission: Secondary | ICD-10-CM

## 2016-09-14 DIAGNOSIS — R7989 Other specified abnormal findings of blood chemistry: Secondary | ICD-10-CM

## 2016-09-14 NOTE — Telephone Encounter (Signed)
Patient asking what can be done regarding his low testosterone level as he is very fatigued. Please advise.  Dx:  Multiple myeloma in relapse ([place]); Fa...   Ref Range & Units 7d ago  Testosterone 264 - 916 ng/dL 148

## 2016-09-14 NOTE — Telephone Encounter (Signed)
Per VO Dr Rogue Bussing, refer to Urology. Patient has no preferences to whom he is referred. Referral submitted to [facility].

## 2016-10-02 ENCOUNTER — Other Ambulatory Visit: Payer: Self-pay | Admitting: *Deleted

## 2016-10-02 ENCOUNTER — Telehealth: Payer: Self-pay | Admitting: *Deleted

## 2016-10-02 MED ORDER — FENTANYL 25 MCG/HR TD PT72: 25.0000 ug | MEDICATED_PATCH | TRANSDERMAL | 0 refills | Status: DC

## 2016-10-02 MED ORDER — FENTANYL 100 MCG/HR TD PT72: 100.0000 ug | MEDICATED_PATCH | TRANSDERMAL | 0 refills | Status: DC

## 2016-10-02 NOTE — Telephone Encounter (Signed)
Per VO Dr B, his chemo has nothing to do with his cholesterol. Patient informed

## 2016-10-02 NOTE — Telephone Encounter (Signed)
revlimid has been on hold due to wound healing issues/left jaw osteonecrosis of the jaw.

## 2016-10-02 NOTE — Telephone Encounter (Signed)
Dr Humphrey Rolls needs a letter from Dr B that [name] is off of his chemo until his cholesterol can be treated. Please wend som\ething so Dr Humphrey Rolls will send in prescription for cholesterol

## 2016-10-02 NOTE — Telephone Encounter (Signed)
Dr Humphrey Rolls needs a letter that it is on hold

## 2016-10-03 NOTE — Progress Notes (Signed)
10/04/2016 2:08 PM   [name] 08/27/1960 [id number]  Referring provider: Petra Kuba, MD [place], [place] [address]  Chief Complaint  Patient presents with  . New Patient (Initial Visit)    Low T referred by Dr. Rogue Bussing    HPI: Patient is a 56 year old Caucasian male who is referred to [place] by Dr. Rogue Bussing for testosterone deficiency.  Testosterone deficiency Patient is experiencing a decrease in libido, a lack of energy, a decrease in strength, a loss in height, a decreased enjoyment in life, sadness and/or grumpiness, erections being less strong, a recent deterioration in an ability to play sports, falling asleep after dinner and  a recent deterioration in their work performance.  This is indicated by his responses to the ADAM questionnaire.  He is no longer having spontaneous erections at night.   He does not have sleep apnea.  He is reporting a poor memory, medications that affect testosterone production, such as high-dose glucocorticoids for a prolonged period and sustained-release opioids, human immunodeficiency virus (HIV)-associated weight loss, moderate to severe chronic obstructive lung disease, infertility, anemia and underwent chemotherapy.        Androgen Deficiency in the Aging Male    [place] Name 10/04/16 1300         Androgen Deficiency in the Aging Male   Do you have a decrease in libido (sex drive) Yes     Do you have lack of energy Yes     Do you have a decrease in strength and/or endurance Yes     Have you lost height Yes     Have you noticed a decreased "enjoyment of life" Yes     Are you sad and/or grumpy Yes     Are your erections less strong Yes     Have you noticed a recent deterioration in your ability to play sports Yes     Are you falling asleep after dinner Yes     Has there been a recent deterioration in your work performance Yes         BPH WITH LUTS  (prostate and/or bladder) His IPSS score today is 13, which is  moderate lower urinary tract symptomatology.  He is mixed with his quality life due to his urinary symptoms.  His major complaints today are nocturia x 2-3, intermittency, hesitancy and straining to urinate.  He has had these symptoms for a few months.  He denies any dysuria, hematuria or suprapubic pain.  He also denies any recent fevers, chills, nausea or vomiting.  Patient's maternal grandfather had fatal prostate cancer and died in his 73's.       IPSS    Row Name 10/04/16 1300         International Prostate Symptom Score   How often have you had the sensation of not emptying your bladder? Less than half the time     How often have you had to urinate less than every two hours? Less than half the time     How often have you found you stopped and started again several times when you urinated? Less than 1 in 5 times     How often have you found it difficult to postpone urination? Not at All     How often have you had a weak urinary stream? Less than half the time     How often have you had to strain to start urination? About half the time  How many times did you typically get up at night to urinate? 3 Times     Total IPSS Score 13       Quality of Life due to urinary symptoms   If you were to spend the rest of your life with your urinary condition just the way it is now how would you feel about that? Mixed        Score:  1-7 Mild 8-19 Moderate 20-35 Severe   Erectile dysfunction His SHIM score is 7, which is severe ED.   He has been having difficulty with erections for a little over one year.   His major complaint is "they just don't happen.".  His libido is diminished.   His risk factors for ED are age, pelvic radiation, BPH, testosterone deficiency, CAD, stress, anxiety, depression, smoking and pain medication.  He denies any painful erections or curvatures with his erections.   He is still having spontaneous erections, but they are not firm enough for penetration.       SHIM      Row Name 10/04/16 1341         SHIM: Over the last 6 months:   How do you rate your confidence that you could get and keep an erection? Very Low     When you had erections with sexual stimulation, how often were your erections hard enough for penetration (entering your partner)? A Few Times (much less than half the time)     During sexual intercourse, how often were you able to maintain your erection after you had penetrated (entered) your partner? A Few Times (much less than half the time)     During sexual intercourse, how difficult was it to maintain your erection to completion of intercourse? Extremely Difficult     When you attempted sexual intercourse, how often was it satisfactory for you? Almost Never or Never       SHIM Total Score   SHIM 7        Score: 1-7 Severe ED 8-11 Moderate ED 12-16 Mild-Moderate ED 17-21 Mild ED 22-25 No ED     Reviewed referral notes.    PMH: Past Medical History:  Diagnosis Date  . Anxiety   . Arthritis   . Atrial septal defect   . CHF (congestive heart failure) ([place])   . COPD (chronic obstructive pulmonary disease) ([place])   . Coronary artery disease   . Dysrhythmias   . GERD (gastroesophageal reflux disease)   . Hyperlipidemia   . Multiple myeloma ([place])   . Multiple myeloma ([place])   . Myocardial infarction ([place])   . Substance abuse     Surgical History: Past Surgical History:  Procedure Laterality Date  . CARDIAC DEFIBRILLATOR PLACEMENT      Home Medications:  Allergies as of 10/04/2016   No Known Allergies     Medication List       Accurate as of 10/04/16  2:08 PM. Always use your most recent med list.          acetaminophen 325 MG tablet Commonly known as:  TYLENOL Take 2 tablets (650 mg total) by mouth every 6 (six) hours as needed for mild pain (or Fever >/= 101).   albuterol 108 (90 Base) MCG/ACT inhaler Commonly known as:  PROVENTIL HFA;VENTOLIN HFA Inhale 2 puffs into the lungs every 6 (six) hours as  needed for wheezing or shortness of breath.   ALPRAZolam 0.5 MG tablet Commonly known as:  XANAX Take 1 tablet (0.5  mg total) by mouth 3 (three) times daily as needed for anxiety.   aspirin 325 MG EC tablet Take 325 mg by mouth daily.   DULoxetine 30 MG capsule Commonly known as:  CYMBALTA TAKE ONE CAPSULE BY MOUTH EVERY DAY   feeding supplement (ENSURE ENLIVE) Liqd Take 237 mLs by mouth 3 (three) times daily between meals.   fentaNYL 100 MCG/HR Commonly known as:  DURAGESIC - dosed mcg/hr Place 1 patch (100 mcg total) onto the skin every other day. Use with a 25 mcg patch for total dose of 125 mcg   fentaNYL 25 MCG/HR patch Commonly known as:  DURAGESIC - dosed mcg/hr Place 1 patch (25 mcg total) onto the skin every other day. Use with a 100 mcg patch for total dose of 125 mcg   furosemide 40 MG tablet Commonly known as:  LASIX Take 20 mg by mouth daily.   gabapentin 100 MG capsule Commonly known as:  NEURONTIN TAKE TWO CAPSULES BY MOUTH THREE TIMES DAILY   guaiFENesin-codeine 100-10 MG/5ML syrup Commonly known as:  ROBITUSSIN AC Take 5 mLs by mouth 3 (three) times daily as needed for cough.   HYDROmorphone 2 MG tablet Commonly known as:  DILAUDID Take 1 tablet (2 mg total) by mouth every 6 (six) hours as needed for severe pain.   ipratropium-albuterol 0.5-2.5 (3) MG/3ML Soln Commonly known as:  DUONEB Take 3 mLs by nebulization every 4 (four) hours as needed.   lenalidomide 25 MG capsule Commonly known as:  REVLIMID Take 1 capsule (25 mg total) by mouth as directed. 1 capsule daily for 21 days and 1 week off   metoprolol tartrate 50 MG tablet Commonly known as:  LOPRESSOR Take 50 mg by mouth 2 (two) times daily.   ondansetron 4 MG tablet Commonly known as:  ZOFRAN Take 1 tablet (4 mg total) by mouth every 8 (eight) hours as needed for nausea or vomiting.   OXYGEN Inhale 3 L into the lungs daily.   polyethylene glycol packet Commonly known as:  MIRALAX /  GLYCOLAX Take 17 g by mouth daily.   potassium chloride SA 20 MEQ tablet Commonly known as:  K-DUR,KLOR-CON Take 1 tablet (20 mEq total) by mouth 2 (two) times daily.   tiotropium 18 MCG inhalation capsule Commonly known as:  SPIRIVA Place 1 capsule (18 mcg total) into inhaler and inhale daily.   traZODone 100 MG tablet Commonly known as:  DESYREL Take 1 tablet (100 mg total) by mouth at bedtime.   varenicline 0.5 MG X 11 & 1 MG X 42 tablet Commonly known as:  CHANTIX STARTING MONTH PAK Take one 0.5 mg tablet by mouth once daily for 3 days, then increase to one 0.5 mg tablet twice daily for 4 days, then increase to one 1 mg tablet twice daily.   varenicline 1 MG tablet Commonly known as:  CHANTIX CONTINUING MONTH PAK Take 1 tablet (1 mg total) by mouth 2 (two) times daily.       Allergies: No Known Allergies  Family History: Family History  Problem Relation Age of Onset  . COPD Mother   . CAD Mother   . Heart attack Father   . Prostate cancer Maternal Grandfather   . Kidney cancer Neg Hx   . Bladder Cancer Neg Hx     Social History:  reports that he has been smoking Cigarettes.  He has a 20.00 pack-year smoking history. He has never used smokeless tobacco. He reports that he does not drink alcohol  or use drugs.  ROS: UROLOGY Frequent Urination?: No Hard to postpone urination?: No Burning/pain with urination?: No Get up at night to urinate?: Yes Leakage of urine?: No Urine stream starts and stops?: Yes Trouble starting stream?: Yes Do you have to strain to urinate?: Yes Blood in urine?: No Urinary tract infection?: No Sexually transmitted disease?: No Injury to kidneys or bladder?: No Painful intercourse?: No Weak stream?: No Erection problems?: No Penile pain?: No  Gastrointestinal Nausea?: Yes Vomiting?: No Indigestion/heartburn?: Yes Diarrhea?: No Constipation?: Yes  Constitutional Fever: No Night sweats?: No Weight loss?: Yes Fatigue?:  Yes  Skin Skin rash/lesions?: No Itching?: No  Eyes Blurred vision?: Yes Double vision?: Yes  Ears/Nose/Throat Sore throat?: No Sinus problems?: No  Hematologic/Lymphatic Swollen glands?: No Easy bruising?: Yes  Cardiovascular Leg swelling?: No Chest pain?: Yes  Respiratory Cough?: Yes Shortness of breath?: Yes  Endocrine Excessive thirst?: Yes  Musculoskeletal Back pain?: Yes Joint pain?: Yes  Neurological Headaches?: No Dizziness?: No  Psychologic Depression?: No Anxiety?: Yes  Physical Exam: BP 102/68   Pulse 94   Ht 6' (1.829 m)   Wt 129 lb 14.4 oz (58.9 kg)   BMI 17.62 kg/m   Constitutional: Well nourished. Alert and oriented, No acute distress. HEENT: [place] AT, moist mucus membranes. Trachea midline, no masses. Cardiovascular: No clubbing, cyanosis, or edema. Respiratory: Normal respiratory effort, no increased work of breathing. GI: Abdomen is soft, non tender, non distended, no abdominal masses. Liver and spleen not palpable.  No hernias appreciated.  Stool sample for occult testing is not indicated.   GU: No CVA tenderness.  No bladder fullness or masses.  Patient with circumcised phallus.  Urethral meatus is patent.  No penile discharge. No penile lesions or rashes. Scrotum without lesions, cysts, rashes and/or edema.  Testicles are located scrotally bilaterally. No masses are appreciated in the testicles. Left and right epididymis are normal. Rectal: Patient with  normal sphincter tone. Anus and perineum without scarring or rashes. No rectal masses are appreciated. Prostate is approximately 25 grams, no nodules are appreciated. Seminal vesicles are normal. Skin: No rashes, bruises or suspicious lesions. Lymph: No cervical or inguinal adenopathy. Neurologic: Grossly intact, no focal deficits, moving all 4 extremities. Psychiatric: Normal mood and affect.  Laboratory Data: Lab Results  Component Value Date   WBC 6.2 09/01/2016   HGB 11.2 (L)  09/01/2016   HCT 32.6 (L) 09/01/2016   MCV 97.1 09/01/2016   PLT 228 09/01/2016    Lab Results  Component Value Date   CREATININE 0.98 09/01/2016    Lab Results  Component Value Date   PSA 0.6 09/25/2013   PSA 0.6 09/24/2013    Lab Results  Component Value Date   TESTOSTERONE 148 (L) 09/07/2016    Lab Results  Component Value Date   AST 15 09/01/2016   Lab Results  Component Value Date   ALT 5 (L) 09/01/2016      Assessment & Plan:    1. Testosterone deficiency  - I explained to patient that the current guidelines from the [place] reports the diagnosis of hypogonadism requires a morning serum total testosterone level at least 2 days apart that is below 300 ng/dL - most insurances require the blood work to be done before 9 am.    - At this time, the patient does not meet this requirement.  He will return for two morning serum testosterones, two days apart before 9 AM   - If the testosterones levels return below 300 ng/dL  we will need to do additional blood work [facility] - if that returns below normal or low normal we will need to add a prolactin) - if that blood work is abnormal - we will need to refer to endocrinology  - Advised the patient that low testosterone levels is a risk factor for CVD- will need cardiac clearance prior to starting testosterone therapy  -I discussed with the patient the side effects of testosterone therapy, such as: enlargement of the prostate gland that may in turn cause LUTS, possible increased risk of PCa, DVT's and/or PE's, possible increased risk of heart attack or stroke, lower sperm count, swelling of the ankles, feet, or body, with or without heart failure, enlarged or painful breasts, have problems breathing while you sleep (sleep apnea), increased prostate specific antigen, mood swings, hypertension and increased red blood cell count - we will need to check HCT/HBG levels prior to therapy and PSA if the patient is over 40  - I also discussed that  some men have had success using clomid for hypogonadism.  It does seem to be more successful in younger men, but there are incidences of good results in middle-aged men.  I explained that it is used in male infertility to stimulate the testicles to make more testosterone/sperm.  There has been no long term data on side effects, but some urologists has been having success with this medication.   - Explained to the patient that we will need cardiac clearance prior to starting any testosterone therapy as some studies had noticed an increase in cardiovascular events within the first 6 months of starting testosterone therapy  2. BPH with LUTS  - IPSS score is 13/3  - Continue conservative management, avoiding bladder irritants and timed voiding's  - most bothersome symptoms is/are nocturia  - will reassess once his testosterone deficiency is confirmed  Erectile dysfunction  - SHIM score is 7  - I explained to the patient that in order to achieve an erection it takes good functioning of the nervous system (parasympathetic and rs, sympathetic, sensory and motor), good blood flow into the erectile tissue of the penis and a desire to have sex  - I explained that conditions like diabetes, hypertension, coronary artery disease, peripheral vascular disease, smoking, alcohol consumption, age, sleep apnea and BPH can diminish the ability to have an erection  - we will obtain a serum testosterone level at this time; if it is abnormal we will need to repeat the study for confirmation  - Explained to patient that he would need cardiac clearance for sexual activity prior to prescribing medication  Return for next week before 9AM for testosterone level.  These notes generated with voice recognition software. I apologize for typographical errors.  Zara Council, [facility] [address], [place], [place] [address] [phone]

## 2016-10-04 ENCOUNTER — Encounter: Payer: Self-pay | Admitting: Urology

## 2016-10-04 ENCOUNTER — Ambulatory Visit (INDEPENDENT_AMBULATORY_CARE_PROVIDER_SITE_OTHER): Payer: Medicaid Other | Admitting: Urology

## 2016-10-04 VITALS — BP 102/68 | HR 94 | Ht 72.0 in | Wt 129.9 lb

## 2016-10-04 DIAGNOSIS — E349 Endocrine disorder, unspecified: Secondary | ICD-10-CM

## 2016-10-04 DIAGNOSIS — N401 Enlarged prostate with lower urinary tract symptoms: Secondary | ICD-10-CM | POA: Diagnosis not present

## 2016-10-04 DIAGNOSIS — N138 Other obstructive and reflux uropathy: Secondary | ICD-10-CM

## 2016-10-04 DIAGNOSIS — N529 Male erectile dysfunction, unspecified: Secondary | ICD-10-CM

## 2016-10-09 ENCOUNTER — Encounter: Payer: Self-pay | Admitting: Internal Medicine

## 2016-10-09 ENCOUNTER — Ambulatory Visit (INDEPENDENT_AMBULATORY_CARE_PROVIDER_SITE_OTHER): Payer: Medicaid Other | Admitting: Internal Medicine

## 2016-10-09 ENCOUNTER — Other Ambulatory Visit: Payer: Self-pay

## 2016-10-09 VITALS — BP 100/58 | HR 96 | Ht 72.0 in

## 2016-10-09 DIAGNOSIS — E291 Testicular hypofunction: Secondary | ICD-10-CM

## 2016-10-09 DIAGNOSIS — J9611 Chronic respiratory failure with hypoxia: Secondary | ICD-10-CM | POA: Diagnosis not present

## 2016-10-09 NOTE — Patient Instructions (Addendum)
STOP SMOKING CONTINUE OXYGEN AS PRESCRIBED Continue inhalers as prescribed

## 2016-10-09 NOTE — Progress Notes (Signed)
  Name: [name] MRN:   [id number] DOB:   [birth year]          consult date 10/09/2016  Hospital follow-up patient seen by Dr. Alva Garnet in the hospital  BRIEF PATIENT DESCRIPTION:  56 yo male with known history of Diastolic Heart failure, COPD and Multiple Myeloma admitted with acute encephalopathy, nausea/vomiting, acute on chronic hypoxic respiratory failure secondary to possible aspiration pneumonia and AECOPD  SIGNIFICANT EVENTS  5/1 Patient admitted to the ICU with increased sob requiring NRB  CC follow up COPD and SOB    HPI  + chronic shortness of breath + intermittent wheezing  On chronic oxygen therapy Continues to smoke daily but has decreased significnatly  His wheezing has significantly improved He still takes Spiriva and Advair as prescribed  Ambulatory pulse oximetry performed in the office today shows significant hypoxia with O2 sat of 87% on exertion    REVIEW OF SYSTEMS:   Constitutional: Negative for fever, chills, weight loss, malaise/fatigue and diaphoresis.  HENT: Negative for hearing loss, ear pain, nosebleeds, congestion, sore throat, neck pain, tinnitus and ear discharge.  Eyes: Negative for blurred vision, double vision, photophobia, pain, discharge and redness.  Respiratory: +cough, -hemoptysis, +sputum production, +shortness of breath, _wheezing and stridor.   Cardiovascular: Negative for chest pain, palpitations, orthopnea, claudication, leg swelling and PND.  Gastrointestinal: neg for N/V Skin: Negative for itching and rash.  Neurological: Negative for dizziness   BP (!) 100/58 (BP Location: Left Arm, Cuff Size: Normal)   Pulse 96   Ht 6' (1.829 m)   SpO2 100%   PHYSICAL EXAMINATION: General: middle aged frail male NAD, thin Neuro: alert and oriented, follows commands HEENT:  AT,[place], no JBD Cardiovascular: s1s2 rrr, no M/R/G Lungs: crackles bilateral bases, +wheezing Abdomen:  Hypoactive BS x4, soft, non tender, non distended    Musculoskeletal: No edema/cyanosis noted Skin:  Warm,dry and Intact, no rashes or lesions   LastLabs      CXR 5/1  I have Independently reviewed images of CXR   Interpretation:no acutue process, no effusions   ASSESSMENT / PLAN:   56 year old ill appearing thin white male with end-stage COPD with progressive chronic hypoxic respiratory failure in the setting of pulmonary cachexia and deconditioned state with ongoing tobacco abuse  With underlying multiple myeloma   #1 shortness of breath and dyspnea on exertion with ongoing wheezing  Related to his severe COPD  And ongoing tobacco abuse    #2 severe end-stage COPD  -Continue Advair as prescribed Continue SPIRIVA as prescribed Albuterol as needed   #3 chronic hypoxic respiratory failure Continue oxygen a prescribed  he will need oxygen 24/7   #4 cough from underlying COPD and hypoxia Also related to smoking  #5 tobacco abuse Smoking cessation strongly advised approx 4 minutes spent I have prescribed Chantix and is working well   #6 multiple myeloma Follow-up oncology as scheduled   Patient/Family are satisfied with Plan of action and management. All questions answered Overall prognosis is poor patient with end-stage respiratory failure with chronic hypoxia  Follow-up in 6  monthss for reassessment   Myonna Chisom Patricia Pesa, M.D.  [facility]  Medical Director [place] Director [facility] Cardio-Pulmonary Department

## 2016-10-10 ENCOUNTER — Other Ambulatory Visit: Payer: Medicaid Other

## 2016-10-10 DIAGNOSIS — E291 Testicular hypofunction: Secondary | ICD-10-CM

## 2016-10-11 ENCOUNTER — Telehealth: Payer: Self-pay

## 2016-10-11 ENCOUNTER — Other Ambulatory Visit: Payer: Self-pay | Admitting: *Deleted

## 2016-10-11 DIAGNOSIS — C9000 Multiple myeloma not having achieved remission: Secondary | ICD-10-CM

## 2016-10-11 DIAGNOSIS — F419 Anxiety disorder, unspecified: Secondary | ICD-10-CM

## 2016-10-11 DIAGNOSIS — E291 Testicular hypofunction: Secondary | ICD-10-CM

## 2016-10-11 LAB — TESTOSTERONE: Testosterone: 268 ng/dL (ref 264–916)

## 2016-10-11 NOTE — Telephone Encounter (Signed)
-----   Message from Nori Riis, PA-C sent at 10/11/2016  7:50 AM EDT ----- Please let Mr. Melin know that his testosterone was not drawn before 9 AM.  It has to be drawn before 9 AM to be a valid test.

## 2016-10-11 NOTE — Telephone Encounter (Signed)
Patient has a refill on current prescription  

## 2016-10-11 NOTE — Telephone Encounter (Signed)
Spoke with pt in reference labs being drawn before 9am. Pt voiced understanding. Pt was added to lab schedule for next week.

## 2016-10-16 ENCOUNTER — Other Ambulatory Visit: Payer: Medicaid Other

## 2016-10-16 ENCOUNTER — Encounter: Payer: Medicaid Other | Attending: Surgery | Admitting: Surgery

## 2016-10-16 ENCOUNTER — Other Ambulatory Visit: Payer: Self-pay | Admitting: *Deleted

## 2016-10-16 DIAGNOSIS — F17218 Nicotine dependence, cigarettes, with other nicotine-induced disorders: Secondary | ICD-10-CM | POA: Diagnosis not present

## 2016-10-16 DIAGNOSIS — F419 Anxiety disorder, unspecified: Secondary | ICD-10-CM | POA: Diagnosis not present

## 2016-10-16 DIAGNOSIS — I2510 Atherosclerotic heart disease of native coronary artery without angina pectoris: Secondary | ICD-10-CM | POA: Diagnosis not present

## 2016-10-16 DIAGNOSIS — I420 Dilated cardiomyopathy: Secondary | ICD-10-CM | POA: Insufficient documentation

## 2016-10-16 DIAGNOSIS — Z923 Personal history of irradiation: Secondary | ICD-10-CM | POA: Diagnosis not present

## 2016-10-16 DIAGNOSIS — Z79899 Other long term (current) drug therapy: Secondary | ICD-10-CM | POA: Insufficient documentation

## 2016-10-16 DIAGNOSIS — C9000 Multiple myeloma not having achieved remission: Secondary | ICD-10-CM

## 2016-10-16 DIAGNOSIS — Z95810 Presence of automatic (implantable) cardiac defibrillator: Secondary | ICD-10-CM | POA: Insufficient documentation

## 2016-10-16 DIAGNOSIS — I10 Essential (primary) hypertension: Secondary | ICD-10-CM | POA: Insufficient documentation

## 2016-10-16 DIAGNOSIS — M278 Other specified diseases of jaws: Secondary | ICD-10-CM | POA: Diagnosis not present

## 2016-10-16 DIAGNOSIS — Z7982 Long term (current) use of aspirin: Secondary | ICD-10-CM | POA: Diagnosis not present

## 2016-10-16 DIAGNOSIS — J449 Chronic obstructive pulmonary disease, unspecified: Secondary | ICD-10-CM | POA: Diagnosis not present

## 2016-10-16 DIAGNOSIS — E291 Testicular hypofunction: Secondary | ICD-10-CM

## 2016-10-16 DIAGNOSIS — E441 Mild protein-calorie malnutrition: Secondary | ICD-10-CM | POA: Diagnosis not present

## 2016-10-16 DIAGNOSIS — G629 Polyneuropathy, unspecified: Secondary | ICD-10-CM | POA: Insufficient documentation

## 2016-10-16 DIAGNOSIS — D649 Anemia, unspecified: Secondary | ICD-10-CM | POA: Insufficient documentation

## 2016-10-16 DIAGNOSIS — Z9221 Personal history of antineoplastic chemotherapy: Secondary | ICD-10-CM | POA: Insufficient documentation

## 2016-10-16 MED ORDER — HYDROMORPHONE HCL 2 MG PO TABS: 2.0000 mg | ORAL_TABLET | Freq: Four times a day (QID) | ORAL | 0 refills | Status: DC | PRN

## 2016-10-17 ENCOUNTER — Telehealth: Payer: Self-pay | Admitting: Internal Medicine

## 2016-10-17 ENCOUNTER — Telehealth: Payer: Self-pay

## 2016-10-17 LAB — TESTOSTERONE: Testosterone: 386 ng/dL (ref 264–916)

## 2016-10-17 NOTE — Telephone Encounter (Signed)
-----   Message from Nori Riis, PA-C sent at 10/17/2016  7:19 AM EDT ----- Please [name] know that his testosterone returned above 300.  He does not meet the criteria for testosterone deficiency at this time.  I suggest he speak with his cardiologist and see if it is safe for him to engage in sexual activity.  If it is safe for Mr. Pribble to engage in sexual activity, we can treat his ED.

## 2016-10-17 NOTE — Telephone Encounter (Signed)
LMOM

## 2016-10-17 NOTE — Progress Notes (Signed)
[name], [name] ([id number]) Visit Report for 10/16/2016 HBO Risk Assessment Details Patient Name: [name], [name] Date of Service: 10/16/2016 12:45 PM Medical Record Number: [id number] Patient Account Number: [id number] Date of Birth/Sex: 1961-01-21 (56 y.o. Male) Treating RN: Montey Hora Primary Care Jeily Guthridge: Sena Hitch Other Clinician: Referring Nkosi Cortright: Annamaria Helling Treating Lundyn Coste/Extender: Frann Rider in Treatment: 0 HBO Risk Assessment Items Answer Barotrauma Risks: Upper Respiratory Infections No Prior Radiation Treatment to Head/Neck No Tracheostomy No Ear problems or surgery (otosclerosis)- Consider pressure No equalization tubes Sinus Problems, Sinus Obstruction No Pulmonary Risks: Currently seeing a pulmonologisto Yes Dr Mortimer Fries Emphysema No Pneumothorax No Tuberculosis No Other lung problems (COPD with CO2 retention, lesions, surgery) Yes -Refer to [facility] Congestive heart Failure -Consider holding HBO if ejection No fraction<30% History of smoking Yes Bullous Disease, Blebs No Other pulmonary abnormalities No Cardiac Risks: Dr Jasmine December Currently seeing a cardiologisto Yes Humphrey Rolls Pacemaker/AICD Yes copy of cards if Yes for Pacemaker: Brand and Model made Hypertension Yes furosemide Diuretic Used (water pill). If yes, last time taken: Yes 20mg  History of prior or current malignancy (Cancer) Veenstra, Revin A. ([id number]) Surgery No Radiation therapy No Chemotherapy Yes Ophthalmic Risks: Optic Neuritis No Cataracts No Myopia No Retinopathy or Retinal Detachment Surgery- Consider pressure No equalization tubes Confinement Anxiety Claustrophobia No Dialysis Dialysis No Any implants; medical or non-medical No Seizures Seizures No Currently using these medications: Aspirin Yes Digoxin (CHF patient) No Narcotics Yes Nitroprusside No Phenothiazine (Thorazine,etc.) No Prednisone or other steroids No Disulfiram (Antabuse) No Mafenide  Acetate (Sulfamylon-burn cream) No Amiodarone No Notes patient reports "fuzzy vision" Electronic Signature(s) Signed: 10/16/2016 5:03:19 PM By: Montey Hora Entered By: Montey Hora on 10/16/2016 13:56:39

## 2016-10-17 NOTE — Progress Notes (Signed)
[name], [name] ([id number]) Visit Report for 10/16/2016 Chief Complaint Document Details Patient Name: [name], [name] Date of Service: 10/16/2016 12:45 PM Medical Record Number: [id number] Patient Account Number: [id number] Date of Birth/Sex: 12-29-1960 (56 y.o. Male) Treating RN: Montey Hora Primary Care Provider: Sena Hitch Other Clinician: Referring Provider: Annamaria Helling Treating Provider/Extender: Frann Rider in Treatment: 0 Information Obtained from: Patient Chief Complaint Patient presents to the Wound Care center for HBO eval due to non healing wound his lower mandible bilaterally which he's had for about a year Electronic Signature(s) Signed: 10/16/2016 4:01:09 PM By: Christin Fudge MD, FACS Entered By: Christin Fudge on 10/16/2016 14:33:42 [name], [name] ([id number]) -------------------------------------------------------------------------------- HPI Details Patient Name: [name] Date of Service: 10/16/2016 12:45 PM Medical Record Number: [id number] Patient Account Number: [id number] Date of Birth/Sex: 28-Jul-1960 (56 y.o. Male) Treating RN: Montey Hora Primary Care Provider: Sena Hitch Other Clinician: Referring Provider: Annamaria Helling Treating Provider/Extender: Frann Rider in Treatment: 0 History of Present Illness Location: nonhealing wounds both lower mandible Quality: Patient reports experiencing a dull pain to affected area(s). Severity: Patient states wound are getting worse. Duration: Patient has had the wound for <12 months prior to presenting for treatment Timing: Pain in wound is Intermittent (comes and goes Context: The wound appeared gradually over time, after his teeth were extracted Modifying Factors: Other treatment(s) tried include:under the treatment of a maxillofacial surgeon HPI Description: This 56 year old gentleman who has been seen by [place] in the past for an unrelated problem, is known to have multiple  myeloma, anemia, COPD, peripheral neuropathy and pedal edema has now been seen by his maxillofacial physician Dr. Annamaria Helling who has referred him to [place] for treatment with hyperbaric oxygen therapy for ulcerative lesions intraorally along the tooth #17 through 22 and distal to tooth #27 there has been bony exposure. The patient has had removal of lateral exostosis in the right lower quadrant and the left lower quadrant he's had tooth extractions in the past. Radiographic radiograph taken demonstrated a mottled appearance of the bones especially in the crestal area of #28 as well as the distal to #23. 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 He is also been seen by Dr. Rogue Bussing, medical oncology, who is holding Revlimid for possible wound healing issues and osteonecrosis of the mandible Of note the patient is been seen by Dr. 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 ======== Old notes: The patient is under treatment with medical oncology Dr. Ma Hillock sees him, for multiple myeloma, anemia, COPD, peripheral neuropathy, pedal edema and has been referred to [place] for left lateral hip pressure ulcer. his past medical history significant for coronary artery disease, dilated cardiomyopathy with an EF of 20% and has had her AICD placement, kidney stones, asthma, COPD, polysubstance abuse, anxiety. continues to smoke a pack of cigarettes a day, history of recreational drug usage and used to take IV drugs in the late 1980s. He was in bed for a long while due to an associated illness and lay on his left side for prolonged periods of time. He has been ambulatory otherwise and has a fairly poor nutrition. 01/01/2015 -- I have reviewed the notes whether the patient was recently  admitted to the hospital between November 30 and 12/27/2014. He was admitted for cellulitis of the left  second finger, COPD exacerbation [place], Kylan A. ([id number]) and hypotension and was found that no surgical debridement was required after a surgical consult by Dr. Adonis Huguenin. he was treated with injectable vancomycin and Zosyn initially and then sent home on oral Augmentin. ====== Electronic Signature(s) Signed: 10/16/2016 4:01:09 PM By: Christin Fudge MD, FACS Entered By: Christin Fudge on 10/16/2016 14:40:56 [name], [name] ([id number]) -------------------------------------------------------------------------------- Physical Exam Details Patient Name: [name] Date of Service: 10/16/2016 12:45 PM Medical Record Number: [id number] Patient Account Number: [id number] Date of Birth/Sex: Jul 08, 1960 (56 y.o. Male) Treating RN: Montey Hora Primary Care Provider: Sena Hitch Other Clinician: Referring Provider: Annamaria Helling Treating Provider/Extender: Frann Rider in Treatment: 0 Constitutional . Pulse regular. Respirations normal and unlabored. Afebrile. . Eyes Nonicteric. Reactive to light. Ears, Nose, Mouth, and Throat Lips, teeth, and gums WNL.Marland Kitchen Moist mucosa without lesions. Neck supple and nontender. No palpable supraclavicular or cervical adenopathy. Normal sized without goiter. Respiratory WNL. No retractions.. Cardiovascular Pedal Pulses WNL. No clubbing, cyanosis or edema. Lymphatic No adneopathy. No adenopathy. No adenopathy. Musculoskeletal Adexa without tenderness or enlargement.. Digits and nails w/o clubbing, cyanosis, infection, petechiae, ischemia, or inflammatory conditions.. Integumentary (Hair, Skin) No suspicious lesions. No crepitus or fluctuance. No peri-wound warmth or erythema. No masses.Marland Kitchen Psychiatric Judgement and insight Intact.. No evidence of depression, anxiety, or agitation.. Notes on examination of the oral cavity the patient has  extensive exposure of bone on the left lower mandible and the right lower mandible and this is associated with significant gingival atrophy and damage. Electronic Signature(s) Signed: 10/16/2016 4:01:09 PM By: Christin Fudge MD, FACS Entered By: Christin Fudge on 10/16/2016 14:41:52 [name], [name] ([id number]) -------------------------------------------------------------------------------- Physician Orders Details Patient Name: [name] Date of Service: 10/16/2016 12:45 PM Medical Record Number: [id number] Patient Account Number: [id number] Date of Birth/Sex: Nov 03, 1960 (56 y.o. Male) Treating RN: Montey Hora Primary Care Provider: Sena Hitch Other Clinician: Referring Provider: Annamaria Helling Treating Provider/Extender: Frann Rider in Treatment: 0 Verbal / Phone Orders: No Diagnosis Coding Hyperbaric Oxygen Therapy o Evaluate for HBO Therapy - subject to cardio and pulmonary clearance o Indication: - osteoradionecrosis Consults o Cardiology - for HBOT clearance and Pulmonology for HBOT clearance Electronic Signature(s) Signed: 10/16/2016 4:01:09 PM By: Christin Fudge MD, FACS Signed: 10/16/2016 5:03:19 PM By: Montey Hora Entered By: Montey Hora on 10/16/2016 13:40:15 [name], [name] Kitchen ([id number]) -------------------------------------------------------------------------------- Problem List Details Patient Name: [name] Date of Service: 10/16/2016 12:45 PM Medical Record Number: [id number] Patient Account Number: [id number] Date of Birth/Sex: [birth year] (56 y.o. Male) Treating RN: Montey Hora Primary Care Provider: Sena Hitch Other Clinician: Referring Provider: Annamaria Helling Treating Provider/Extender: Frann Rider in Treatment: 0 Active Problems ICD-10 Encounter Code Description Active Date Diagnosis M27.8 Other specified diseases of jaws 10/16/2016 Yes C90.00 Multiple myeloma not having achieved remission 10/16/2016  Yes F17.218 Nicotine dependence, cigarettes, with other nicotine- 10/16/2016 Yes induced disorders E44.1 Mild protein-calorie malnutrition 10/16/2016 Yes J44.9 Chronic obstructive pulmonary disease, unspecified 10/16/2016 Yes Inactive Problems Resolved Problems Electronic Signature(s) Signed: 10/16/2016 4:01:09 PM By: Christin Fudge MD, FACS Entered By: Christin Fudge on 10/16/2016 14:32:45 Eichholz, [name] ([id number]) -------------------------------------------------------------------------------- Progress Note Details Patient Name: [name] Date of Service: 10/16/2016 12:45 PM Medical Record Number: [id number] Patient Account Number: [id number] Date of Birth/Sex: 28-May-1960 (56 y.o. Male) Treating RN: Montey Hora Primary Care Provider: Sena Hitch Other Clinician: Referring Provider: Annamaria Helling Treating Provider/Extender: Frann Rider in Treatment: 0 Subjective Chief Complaint Information obtained from Patient Patient presents to the  Wound Care center for HBO eval due to non healing wound his lower mandible bilaterally which he's had for about a year History of Present Illness (HPI) The following HPI elements were documented for the patient's wound: Location: nonhealing wounds both lower mandible Quality: Patient reports experiencing a dull pain to affected area(s). Severity: Patient states wound are getting worse. Duration: Patient has had the wound for Timing: Pain in wound is Intermittent (comes and goes Context: The wound appeared gradually over time, after his teeth were extracted Modifying Factors: Other treatment(s) tried include:under the treatment of a maxillofacial surgeon This 56 year old gentleman who has been seen by [place] in the past for an unrelated problem, is known to have multiple myeloma, anemia, COPD, peripheral neuropathy and pedal edema has now been seen by his maxillofacial physician Dr. Annamaria Helling who has referred him to [place] for treatment  with hyperbaric oxygen therapy for ulcerative lesions intraorally along the tooth #17 through 22 and distal to tooth #27 there has been bony exposure. The patient has had removal of lateral exostosis in the right lower quadrant and the left lower quadrant he's had tooth extractions in the past. Radiographic radiograph taken demonstrated a mottled appearance of the bones especially in the crestal area of #28 as well as the distal to #23. 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 He is also been seen by Dr. Rogue Bussing, medical oncology, who is holding Revlimid for possible wound healing issues and osteonecrosis of the mandible Of note the patient is been seen by Dr. 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 ======== Old notes: The patient is under treatment with medical oncology Dr. Ma Hillock sees him, for multiple myeloma, anemia, COPD, peripheral neuropathy, pedal edema and has been referred to [place] for left lateral hip pressure ulcer. his past medical history significant for coronary artery disease, dilated cardiomyopathy with an EF of 20% and has had her AICD placement, kidney stones, asthma, COPD, polysubstance abuse, anxiety. continues [name], [name] A. ([id number]) to smoke a pack of cigarettes a day, history of recreational drug usage and used to take IV drugs in the late 1980s. He was in bed for a long while due to an associated illness and lay on his left side for prolonged periods of time. He has been ambulatory otherwise and has a fairly poor nutrition. 01/01/2015 -- I have reviewed the notes whether the patient was recently admitted to the hospital between November 30 and 12/27/2014. He was admitted for cellulitis of the left second finger, COPD  exacerbation and hypotension and was found that no surgical debridement was required after a surgical consult by Dr. Adonis Huguenin. he was treated with injectable vancomycin and Zosyn initially and then sent home on oral Augmentin. ====== Patient History Information obtained from Patient. Allergies No Known Allergies Family History Cancer - Mother,Mother, Heart Disease - Mother, Hypertension - Mother, Lung Disease - Mother, Stroke - Siblings, Thyroid Problems - Siblings, No family history of Diabetes, Hereditary Spherocytosis, Kidney Disease, Seizures, Tuberculosis. Social History Former smoker - 2 months, Marital Status - Married, Alcohol Use - Rarely - quit, Drug Use - No History, Caffeine Use - Daily. Medical History Oncologic Patient has history of Received Chemotherapy Medical And Surgical History Notes Cardiovascular pace maker, defibillator Oncologic multiple myeloma General Notes: pacemaker and defibilator Medications aspirin 325 mg  tablet oral 1 1 tablet oral daily [name], [name] A. ([id number]) acetaminophen 325 mg tablet oral 2 2 tablet oral (650 mg.) every six hours as needed fentanyl 100 mcg/hr transdermal patch transdermal 1 1 patch 72 hour transdermal every other day; use with one 25 mcg patch for 125 mcg total fentanyl 25 mcg/hr transdermal patch transdermal 1 1 patch 72 hour transdermal every other day with one 100 mcg patch for a total of 125 mcg/hr daily hydromorphone 2 mg tablet oral 1 1 tablet oral every six hours as needed alprazolam 0.5 mg tablet oral 1 1 tablet oral three times daily Spiriva with HandiHaler 18 mcg and inhalation capsules inhalation 1 1 capsule, w/inhalation device inhalation daily gabapentin 100 mg capsule oral 2 2 capsule oral (200 mg.) three times daily ondansetron 4 mg disintegrating tablet oral 1 1 tablet,disintegrating oral every eight hours as needed Revlimid 25 mg capsule oral 1 1 capsule oral daily for 21 days then one week off albuterol  sulfate HFA 90 mcg/actuation aerosol inhaler inhalation 2 2 HFA aerosol inhaler inhalations every six hours as needed ipratropium-albuterol 0.5 mg-3 mg(2.5 mg base)/3 mL nebulization soln inhalation 1 1 solution for nebulization inhalation every four hours as needed metoprolol tartrate 50 mg tablet oral 1 1 tablet oral two times daily Ensure Enlive 0.08 gram-1.5 kcal/mL oral liquid oral 1 1 liquid oral three times daily between meals polyethylene glycol 3350 17 gram oral powder packet oral 1 1 powder in packet oral daily furosemide 40 mg tablet oral one half tablet (20 mg.) oral daily codeine 10 mg-guaifenesin 100 mg/5 mL oral liquid oral 5 mL liquid oral three times daily as needed potassium chloride ER 20 mEq tablet,extended release oral 1 1 tablet extended release oral two times daily trazodone 100 mg tablet oral 1 1 tablet oral nightly duloxetine 30 mg capsule,delayed release oral 1 1 capsule,delayed release(DR/EC) oral daily Chantix Continuing Month Box 1 mg tablet oral 1 1 tablet oral two times daily Objective Constitutional Pulse regular. Respirations normal and unlabored. Afebrile. Vitals Time Taken: 1:04 PM, Height: 72 in, Source: Measured, Weight: 131 lbs, Source: Measured, BMI: 17.8, Temperature: 98.2 F, Pulse: 80 bpm, Respiratory Rate: 16 breaths/min, Blood Pressure: 100/71 mmHg. Eyes Nonicteric. Reactive to light. Ears, Nose, Mouth, and Throat Lips, teeth, and gums WNL.Marland Kitchen Moist mucosa without lesions. Neck supple and nontender. No palpable supraclavicular or cervical adenopathy. Normal sized without goiter. Respiratory [name], [name] A. ([id number]) WNL. No retractions.. Cardiovascular Pedal Pulses WNL. No clubbing, cyanosis or edema. Lymphatic No adneopathy. No adenopathy. No adenopathy. Musculoskeletal Adexa without tenderness or enlargement.. Digits and nails w/o clubbing, cyanosis, infection, petechiae, ischemia, or inflammatory conditions.Marland Kitchen Psychiatric Judgement  and insight Intact.. No evidence of depression, anxiety, or agitation.. General Notes: on examination of the oral cavity the patient has extensive exposure of bone on the left lower mandible and the right lower mandible and this is associated with significant gingival atrophy and damage. Integumentary (Hair, Skin) No suspicious lesions. No crepitus or fluctuance. No peri-wound warmth or erythema. No masses.. Assessment Active Problems ICD-10 M27.8 - Other specified diseases of jaws C90.00 - Multiple myeloma not having achieved remission F17.218 - Nicotine dependence, cigarettes, with other nicotine-induced disorders E44.1 - Mild protein-calorie malnutrition J44.9 - Chronic obstructive pulmonary disease, unspecified this 56 year old gentleman has osteonecrosis of the mandible possibly as a result of his medications with Zometa which is known to cause this issue. The patient has never received radiation therapy. I do not have any x-rays of his mandible and no  biopsy suggestive of osteomyelitis at the present time. The patient is rather feeble with end stages of COPD and we will have to evaluate him for the possibility of hyperbaric oxygen therapy. [name], [name] ([id number]) I have discussed with the patient and his partner that we will discuss with his maxillofacial surgeon and his medical oncologist regarding the suitability of treatment and getting his insurance company to pre-certify him for HBO treatments possibly 30-40 treatments, with the usual protocol. However, the patient's cardiac and pulmonary function need to be thoroughly evaluated to see if he is going to be able to safely undergo hyperbaric oxygen therapy which will entail being in the chamber for 90 minutes 5 days a week for 6-8 weeks. The risks benefits alternatives and all the possible complications including cardiac issues, pulmonary issues, pneumothorax, seizure disorders and possible vision changes have been discussed  with him in great detail. We will see him back in 2 weeks' time and in the meanwhile we will try and have several discussions with the other specialists regarding the optimal treatment plan Plan Hyperbaric Oxygen Therapy: Evaluate for HBO Therapy - subject to cardio and pulmonary clearance Indication: - osteoradionecrosis Consults ordered were: Cardiology - for HBOT clearance and Pulmonology for HBOT clearance this 56 year old gentleman has osteonecrosis of the mandible possibly as a result of his medications with Zometa which is known to cause this issue. The patient has never received radiation therapy. I do not have any x-rays of his mandible and no biopsy suggestive of osteomyelitis at the present time. The patient is rather feeble with end stages of COPD and we will have to evaluate him for the possibility of hyperbaric oxygen therapy. I have discussed with the patient and his partner that we will discuss with his maxillofacial surgeon and his medical oncologist regarding the suitability of treatment and getting his insurance company to pre-certify him for HBO treatments possibly 30-40 treatments, with the usual protocol. However, the patient's cardiac and pulmonary function need to be thoroughly evaluated to see if he is going to be able to safely undergo hyperbaric oxygen therapy which will entail being in the chamber for 90 minutes 5 days a week for 6-8 weeks. The risks benefits alternatives and all the possible complications including cardiac issues, pulmonary issues, pneumothorax, seizure disorders and possible vision changes have been discussed with him in great detail. We will see him back in 2 weeks' time and in the meanwhile we will try and have several discussions with the other specialists regarding the optimal treatment plan [name], [name] ([id number]) Electronic Signature(s) Signed: 10/16/2016 4:01:09 PM By: Christin Fudge MD, FACS Entered By: Christin Fudge on 10/16/2016  14:46:21 [name], [name] ([id number]) -------------------------------------------------------------------------------- ROS/PFSH Details Patient Name: [name] Date of Service: 10/16/2016 12:45 PM Medical Record Number: [id number] Patient Account Number: [id number] Date of Birth/Sex: Apr 06, 1960 (56 y.o. Male) Treating RN: Montey Hora Primary Care Provider: Sena Hitch Other Clinician: Referring Provider: Annamaria Helling Treating Provider/Extender: Frann Rider in Treatment: 0 Information Obtained From Patient Wound History Eyes Medical History: Negative for: Cataracts; Glaucoma; Optic Neuritis Ear/Nose/Mouth/Throat Medical History: Negative for: Chronic sinus problems/congestion; Middle ear problems Hematologic/Lymphatic Medical History: Negative for: Anemia; Hemophilia; Human Immunodeficiency Virus; Lymphedema; Sickle Cell Disease Respiratory Medical History: Positive for: Chronic Obstructive Pulmonary Disease (COPD) Negative for: Aspiration; Asthma; Pneumothorax; Sleep Apnea; Tuberculosis Cardiovascular Medical History: Positive for: Arrhythmia; Hypertension Past Medical History Notes: Psychologist, forensic, Barrister's clerk Gastrointestinal Medical History: Negative for: Cirrhosis ; Colitis; Crohnos; Hepatitis A; Hepatitis B; Hepatitis C  Endocrine Medical History: Negative for: Type I Diabetes; Type II Diabetes Genitourinary Reny, Reilley A. ([id number]) Medical History: Negative for: End Stage Renal Disease Immunological Medical History: Negative for: Lupus Erythematosus; Raynaudos; Scleroderma Integumentary (Skin) Medical History: Negative for: History of Burn; History of pressure wounds Musculoskeletal Medical History: Negative for: Gout; Rheumatoid Arthritis; Osteoarthritis; Osteomyelitis Neurologic Medical History: Negative for: Dementia; Neuropathy; Quadriplegia; Paraplegia; Seizure Disorder Oncologic Medical History: Positive for: Received  Chemotherapy; Received Radiation - by pill Past Medical History Notes: multiple myeloma Psychiatric Medical History: Negative for: Anorexia/bulimia; Confinement Anxiety Immunizations Pneumococcal Vaccine: Received Pneumococcal Vaccination: No Immunization Notes: up to date Implantable Devices Family and Social History Cancer: Yes - Mother,Mother; Diabetes: No; Heart Disease: Yes - Mother; Hereditary Spherocytosis: No; Hypertension: Yes - Mother; Kidney Disease: No; Lung Disease: Yes - Mother; Seizures: No; Stroke: Yes - Siblings; Thyroid Problems: Yes - Siblings; Tuberculosis: No; Former smoker - 2 months; Marital Status - Married; Alcohol Use: Rarely - quit; Drug Use: No History; Caffeine Use: Daily; Financial Concerns: No; Food, Clothing or Shelter Needs: No; Support System Lacking: No; Advanced Directives: No; Patient does not want information on Advanced Directives; Living Will: No Physician Affirmation Alber, Wilberto A. ([id number]) I have reviewed and agree with the above information. Notes pacemaker and Visual merchandiser) Signed: 10/16/2016 4:01:09 PM By: Christin Fudge MD, FACS Signed: 10/16/2016 5:03:19 PM By: Montey Hora Entered By: Christin Fudge on 10/16/2016 13:23:55 Coco, [name] ([id number]) -------------------------------------------------------------------------------- SuperBill Details Patient Name: [name] Date of Service: 10/16/2016 Medical Record Number: [id number] Patient Account Number: [id number] Date of Birth/Sex: 03-Mar-1960 (56 y.o. Male) Treating RN: Montey Hora Primary Care Provider: Sena Hitch Other Clinician: Referring Provider: Annamaria Helling Treating Provider/Extender: Frann Rider in Treatment: 0 Diagnosis Coding ICD-10 Codes Code Description M27.8 Other specified diseases of jaws C90.00 Multiple myeloma not having achieved remission F17.218 Nicotine dependence, cigarettes, with other nicotine-induced  disorders E44.1 Mild protein-calorie malnutrition J44.9 Chronic obstructive pulmonary disease, unspecified Physician Procedures CPT4 Code Description: [id number] - WC PHYS LEVEL 4 - EST PT ICD-10 Description Diagnosis M27.8 Other specified diseases of jaws C90.00 Multiple myeloma not having achieved remission F17.218 Nicotine dependence, cigarettes, with other nicot J44.9  Chronic obstructive pulmonary disease, unspecifie Modifier: ine-induced di d Quantity: 1 sorders Engineer, maintenance) Signed: 10/16/2016 4:01:09 PM By: Christin Fudge MD, FACS Entered By: Christin Fudge on 10/16/2016 14:58:59

## 2016-10-17 NOTE — Telephone Encounter (Signed)
LMOM for pt to return call in regards to his message.

## 2016-10-17 NOTE — Progress Notes (Addendum)
BLANCA, THORNTON ([id number]) Visit Report for 10/16/2016 Allergy List Details Patient Name: MAYS, PAINO Date of Service: 10/16/2016 12:45 PM Medical Record Number: [id number] Patient Account Number: [id number] Date of Birth/Sex: 1960-07-13 (56 y.o. Male) Treating RN: Montey Hora Primary Care Katianne Barre: Sena Hitch Other Clinician: Referring Kourtney Terriquez: Annamaria Helling Treating Giara Mcgaughey/Extender: Frann Rider in Treatment: 0 Allergies Active Allergies No Known Allergies Allergy Notes Electronic Signature(s) Signed: 10/16/2016 5:03:19 PM By: Montey Hora Entered By: Montey Hora on 10/16/2016 13:06:41 Chavis, [name] ([id number]) -------------------------------------------------------------------------------- Arrival Information Details Patient Name: [name] Date of Service: 10/16/2016 12:45 PM Medical Record Number: [id number] Patient Account Number: [id number] Date of Birth/Sex: May 01, 1960 (56 y.o. Male) Treating RN: Montey Hora Primary Care Gwenivere Hiraldo: Sena Hitch Other Clinician: Referring Yasmeen Manka: Annamaria Helling Treating Braxton Weisbecker/Extender: Frann Rider in Treatment: 0 Visit Information Patient Arrived: Wheel Chair Arrival Time: 13:01 Accompanied By: girlfriend Transfer Assistance: None Patient Identification Verified: Yes Secondary Verification Process Yes Completed: Patient Has Alerts: Yes Patient Alerts: aspirin 325 History Since Last Visit Added or deleted any medications: No Any new allergies or adverse reactions: No Had a fall or experienced change in activities of daily living that may affect risk of falls: No Signs or symptoms of abuse/neglect since last visito No Hospitalized since last visit: No Electronic Signature(s) Signed: 10/16/2016 5:03:19 PM By: Montey Hora Entered By: Montey Hora on 10/16/2016 13:02:50 Grizzle, [name]  ([id number]) -------------------------------------------------------------------------------- Clinic Level of Care Assessment Details Patient Name: [name] Date of Service: 10/16/2016 12:45 PM Medical Record Number: [id number] Patient Account Number: [id number] Date of Birth/Sex: 02-Jan-1961 (55 y.o. Male) Treating RN: Montey Hora Primary Care Latorria Zeoli: Sena Hitch Other Clinician: Referring Oneil Behney: Annamaria Helling Treating Ariann Khaimov/Extender: Frann Rider in Treatment: 0 Clinic Level of Care Assessment Items TOOL 2 Quantity Score []  - Use when only an EandM is performed on the INITIAL visit 0 ASSESSMENTS - Nursing Assessment / Reassessment X - General Physical Exam (combine w/ comprehensive assessment (listed just 1 20 below) when performed on new pt. evals) X - Comprehensive Assessment (HX, ROS, Risk Assessments, Wounds Hx, etc.) 1 25 ASSESSMENTS - Wound and Skin Assessment / Reassessment []  - Simple Wound Assessment / Reassessment - one wound 0 []  - Complex Wound Assessment / Reassessment - multiple wounds 0 []  - Dermatologic / Skin Assessment (not related to wound area) 0 ASSESSMENTS - Ostomy and/or Continence Assessment and Care []  - Incontinence Assessment and Management 0 []  - Ostomy Care Assessment and Management (repouching, etc.) 0 PROCESS - Coordination of Care X - Simple Patient / Family Education for ongoing care 1 15 []  - Complex (extensive) Patient / Family Education for ongoing care 0 []  - Staff obtains Programmer, systems, Records, Test Results / Process Orders 0 []  - Staff telephones HHA, Nursing Homes / Clarify orders / etc 0 []  - Routine Transfer to another Facility (non-emergent condition) 0 []  - Routine Hospital Admission (non-emergent condition) 0 X - New Admissions / Biomedical engineer / Ordering NPWT, Apligraf, etc. 1 15 []  - Emergency Hospital Admission (emergent condition) 0 X - Simple Discharge Coordination 1 10 Trull, Donaldo A.  ([id number]) []  - Complex (extensive) Discharge Coordination 0 PROCESS - Special Needs []  - Pediatric / Minor Patient Management 0 []  - Isolation Patient Management 0 []  - Hearing / Language / Visual special needs 0 []  - Assessment of Community assistance (transportation, D/C planning, etc.) 0 []  - Additional assistance / Altered mentation 0 []  - Support Surface(s) Assessment (bed, cushion, seat, etc.) 0 INTERVENTIONS -  Wound Cleansing / Measurement []  - Wound Imaging (photographs - any number of wounds) 0 []  - Wound Tracing (instead of photographs) 0 []  - Simple Wound Measurement - one wound 0 []  - Complex Wound Measurement - multiple wounds 0 []  - Simple Wound Cleansing - one wound 0 []  - Complex Wound Cleansing - multiple wounds 0 INTERVENTIONS - Wound Dressings []  - Small Wound Dressing one or multiple wounds 0 []  - Medium Wound Dressing one or multiple wounds 0 []  - Large Wound Dressing one or multiple wounds 0 []  - Application of Medications - injection 0 INTERVENTIONS - Miscellaneous []  - External ear exam 0 []  - Specimen Collection (cultures, biopsies, blood, body fluids, etc.) 0 []  - Specimen(s) / Culture(s) sent or taken to Lab for analysis 0 []  - Patient Transfer (multiple staff / Civil Service fast streamer / Similar devices) 0 []  - Simple Staple / Suture removal (25 or less) 0 []  - Complex Staple / Suture removal (26 or more) 0 Dancy, Torren A. ([id number]) []  - Hypo / Hyperglycemic Management (close monitor of Blood Glucose) 0 []  - Ankle / Brachial Index (ABI) - do not check if billed separately 0 Has the patient been seen at the hospital within the last three years: Yes Total Score: 85 Level Of Care: New/Established - Level 3 Electronic Signature(s) Signed: 10/17/2016 3:49:32 PM By: Montey Hora Entered By: Montey Hora on 10/17/2016 13:49:54 Ismael, [name] ([id number]) -------------------------------------------------------------------------------- Encounter Discharge  Information Details Patient Name: [name] Date of Service: 10/16/2016 12:45 PM Medical Record Number: [id number] Patient Account Number: [id number] Date of Birth/Sex: 1960-07-01 (56 y.o. Male) Treating RN: Montey Hora Primary Care Rashay Barnette: Sena Hitch Other Clinician: Referring Ozil Stettler: Annamaria Helling Treating Ladd Cen/Extender: Frann Rider in Treatment: 0 Encounter Discharge Information Items Discharge Pain Level: 0 Discharge Condition: Stable Ambulatory Status: Wheelchair Discharge Destination: Home Private Transportation: Auto Accompanied By: girlfriend Schedule Follow-up Appointment: Yes Medication Reconciliation completed and No provided to Patient/Care Tishara Pizano: Clinical Summary of Care: Electronic Signature(s) Signed: 10/16/2016 5:03:19 PM By: Montey Hora Entered By: Montey Hora on 10/16/2016 13:36:23 Codd, [name] ([id number]) -------------------------------------------------------------------------------- Multi Wound Chart Details Patient Name: [name] Date of Service: 10/16/2016 12:45 PM Medical Record Number: [id number] Patient Account Number: [id number] Date of Birth/Sex: 1960-07-15 (55 y.o. Male) Treating RN: Montey Hora Primary Care Raden Byington: Sena Hitch Other Clinician: Referring Bolton Canupp: Annamaria Helling Treating Edie Darley/Extender: Frann Rider in Treatment: 0 Vital Signs Height(in): 72 Pulse(bpm): 80 Weight(lbs): 131 Blood Pressure 100/71 (mmHg): Body Mass Index(BMI): 18 Temperature(F): 98.2 Respiratory Rate 16 (breaths/min): Wound Assessments Treatment Notes Electronic Signature(s) Signed: 10/16/2016 4:01:09 PM By: Christin Fudge MD, FACS Entered By: Christin Fudge on 10/16/2016 14:33:13 Wieser, [name] ([id number]) -------------------------------------------------------------------------------- [place] Details Patient Name: [name] Date of Service: 10/16/2016 12:45  PM Medical Record Number: [id number] Patient Account Number: [id number] Date of Birth/Sex: Dec 20, 1960 (56 y.o. Male) Treating RN: Montey Hora Primary Care Taniya Dasher: Sena Hitch Other Clinician: Referring Sidney Silberman: Annamaria Helling Treating Quadasia Newsham/Extender: Frann Rider in Treatment: 0 Active Inactive ` HBO Nursing Diagnoses: Potential for barotraumas to ears, sinuses, teeth, and lungs or cerebral gas embolism related to changes in atmospheric pressure inside hyperbaric oxygen chamber Potential for oxygen toxicity seizures related to delivery of 100% oxygen at an increased atmospheric pressure Potential for pulmonary oxygen toxicity related to delivery of 100% oxygen at an increased atmospheric pressure Goals: Patient will tolerate the hyperbaric oxygen therapy treatment Date Initiated: 10/16/2016 Target Resolution Date: 12/29/2016 Goal Status: Active Interventions: Assess for signs  and symptoms related to adverse events, including but not limited to confinement anxiety, pneumothorax, oxygen toxicity and baurotrauma Notes: ` Abuse / Safety / Falls / Self Care Management Nursing Diagnoses: Impaired physical mobility Goals: Patient will not experience any injury related to falls Date Initiated: 10/16/2016 Target Resolution Date: 12/29/2016 Goal Status: Active Interventions: Assess fall risk on admission and as needed Notes: ZAHIR, EISENHOUR A. ([id number]) Nutrition Nursing Diagnoses: Imbalanced nutrition Goals: Patient/caregiver agrees to and verbalizes understanding of need to use nutritional supplements and/or vitamins as prescribed Date Initiated: 10/16/2016 Target Resolution Date: 12/29/2016 Goal Status: Active Interventions: Assess patient nutrition upon admission and as needed per policy Notes: Electronic Signature(s) Signed: 10/16/2016 5:03:19 PM By: Montey Hora Entered By: Montey Hora on 10/16/2016 13:35:41 Howland, Raykwon AMarland Kitchen  ([id number]) -------------------------------------------------------------------------------- Pain Assessment Details Patient Name: [name] Date of Service: 10/16/2016 12:45 PM Medical Record Number: [id number] Patient Account Number: [id number] Date of Birth/Sex: [birth year] (56 y.o. Male) Treating RN: Montey Hora Primary Care Shanie Mauzy: Sena Hitch Other Clinician: Referring Joycelynn Fritsche: Annamaria Helling Treating Grey Rakestraw/Extender: Frann Rider in Treatment: 0 Active Problems Location of Pain Severity and Description of Pain Patient Has Paino Yes Site Locations Pain Location: Generalized Pain Pain Management and Medication Current Pain Management: Electronic Signature(s) Signed: 10/16/2016 5:03:19 PM By: Montey Hora Entered By: Montey Hora on 10/16/2016 13:03:59 Squillace, [name] ([id number]) -------------------------------------------------------------------------------- Patient/Caregiver Education Details Patient Name: [name] Date of Service: 10/16/2016 12:45 PM Medical Record Number: [id number] Patient Account Number: [id number] Date of Birth/Gender: [birth year] (56 y.o. Male) Treating RN: Montey Hora Primary Care Physician: Sena Hitch Other Clinician: Referring Physician: Annamaria Helling Treating Physician/Extender: Frann Rider in Treatment: 0 Education Assessment Education Provided To: Patient and Caregiver Education Topics Provided Hyperbaric Oxygenation: Handouts: Other: by Dr Con Memos Electronic Signature(s) Signed: 10/17/2016 3:49:32 PM By: Montey Hora Entered By: Montey Hora on 10/17/2016 13:50:32 Bethard, [name] ([id number]) -------------------------------------------------------------------------------- [place] Details Patient Name: [name] Date of Service: 10/16/2016 12:45 PM Medical Record Number: [id number] Patient Account Number: [id number] Date of Birth/Sex: 01-Oct-1960 (56 y.o. Male) Treating RN: Montey Hora Primary Care Audrielle Vankuren: Sena Hitch Other Clinician: Referring Jelani Trueba: Annamaria Helling Treating Alphonse Asbridge/Extender: Frann Rider in Treatment: 0 Vital Signs Time Taken: 13:04 Temperature (F): 98.2 Height (in): 72 Pulse (bpm): 80 Source: Measured Respiratory Rate (breaths/min): 16 Weight (lbs): 131 Blood Pressure (mmHg): 100/71 Source: Measured Reference Range: 80 - 120 mg / dl Body Mass Index (BMI): 17.8 Electronic Signature(s) Signed: 10/16/2016 5:03:19 PM By: Montey Hora Entered By: Montey Hora on 10/16/2016 13:05:54

## 2016-10-17 NOTE — Telephone Encounter (Signed)
Needs to be monitored

## 2016-10-17 NOTE — Telephone Encounter (Signed)
Please advise on message below.

## 2016-10-17 NOTE — Progress Notes (Signed)
CRUE, OTERO ([id number]) Visit Report for 10/16/2016 Abuse/Suicide Risk Screen Details Patient Name: [name], [name] Date of Service: 10/16/2016 12:45 PM Medical Record Number: [id number] Patient Account Number: [id number] Date of Birth/Sex: December 20, 1960 (56 y.o. Male) Treating RN: Montey Hora Primary Care Sila Sarsfield: Sena Hitch Other Clinician: Referring Haden Cavenaugh: Annamaria Helling Treating Parke Jandreau/Extender: Frann Rider in Treatment: 0 Abuse/Suicide Risk Screen Items Answer ABUSE/SUICIDE RISK SCREEN: Has anyone close to you tried to hurt or harm you recentlyo No Do you feel uncomfortable with anyone in your familyo No Has anyone forced you do things that you didnot want to doo No Do you have any thoughts of harming yourselfo No Patient displays signs or symptoms of abuse and/or neglect. No Electronic Signature(s) Signed: 10/16/2016 5:03:19 PM By: Montey Hora Entered By: Montey Hora on 10/16/2016 13:10:30 [name], [name] AMarland Kitchen ([id number]) -------------------------------------------------------------------------------- Activities of Daily Living Details Patient Name: [name] Date of Service: 10/16/2016 12:45 PM Medical Record Number: [id number] Patient Account Number: [id number] Date of Birth/Sex: 22-May-1960 (56 y.o. Male) Treating RN: Montey Hora Primary Care Carloyn Lahue: Sena Hitch Other Clinician: Referring Sherman Donaldson: Annamaria Helling Treating Brithany Whitworth/Extender: Frann Rider in Treatment: 0 Activities of Daily Living Items Answer Activities of Daily Living (Please select one for each item) Drive Automobile Not Able Take Medications Completely Able Use Telephone Completely Able Care for Appearance Completely Able Use Toilet Completely Able Bath / Shower Completely Able Dress Self Completely Able Feed Self Completely Able Walk Completely Able Get In / Out Bed Completely Able Housework Completely Able Prepare Meals Completely [place] for Self Need Assistance Electronic Signature(s) Signed: 10/16/2016 5:03:19 PM By: Montey Hora Entered By: Montey Hora on 10/16/2016 13:11:00 [name], [name] ([id number]) -------------------------------------------------------------------------------- Education Assessment Details Patient Name: [name] Date of Service: 10/16/2016 12:45 PM Medical Record Number: [id number] Patient Account Number: [id number] Date of Birth/Sex: [birth year] (56 y.o. Male) Treating RN: Montey Hora Primary Care Deanne Bedgood: Sena Hitch Other Clinician: Referring Joel Mericle: Annamaria Helling Treating Troi Florendo/Extender: Frann Rider in Treatment: 0 Primary Learner Assessed: Patient Learning Preferences/Education Level/Primary Language Learning Preference: Explanation, Demonstration Highest Education Level: High School Preferred Language: English Cognitive Barrier Assessment/Beliefs Language Barrier: No Translator Needed: No Memory Deficit: No Emotional Barrier: No Cultural/Religious Beliefs Affecting Medical No Care: Physical Barrier Assessment Impaired Vision: No Impaired Hearing: No Decreased Hand dexterity: No Knowledge/Comprehension Assessment Knowledge Level: Medium Comprehension Level: Medium Ability to understand written Medium instructions: Ability to understand verbal Medium instructions: Motivation Assessment Anxiety Level: Calm Cooperation: Cooperative Education Importance: Acknowledges Need Interest in Health Problems: Asks Questions Perception: Coherent Willingness to Engage in Self- Medium Management Activities: Readiness to Engage in Self- Medium Management Activities: Electronic Signature(s) [name], [name] ([id number]) Signed: 10/16/2016 5:03:19 PM By: Montey Hora Entered By: Montey Hora on 10/16/2016 13:11:27 [name], [name] ([id number]) -------------------------------------------------------------------------------- Fall  Risk Assessment Details Patient Name: [name] Date of Service: 10/16/2016 12:45 PM Medical Record Number: [id number] Patient Account Number: [id number] Date of Birth/Sex: 07-27-1960 (56 y.o. Male) Treating RN: Montey Hora Primary Care Noa Constante: Sena Hitch Other Clinician: Referring Samier Jaco: Annamaria Helling Treating Tiffeny Minchew/Extender: Frann Rider in Treatment: 0 Fall Risk Assessment Items Have you had 2 or more falls in the last 12 monthso 0 No Have you had any fall that resulted in injury in the last 12 monthso 0 No FALL RISK ASSESSMENT: History of falling - immediate or within 3 months 0 No Secondary diagnosis 0 No Ambulatory aid None/bed rest/wheelchair/nurse 0 No Crutches/cane/walker 15 Yes Furniture 0  No IV Access/Saline Lock 0 No Gait/Training Normal/bed rest/immobile 0 No Weak 10 Yes Impaired 0 No Mental Status Oriented to own ability 0 Yes Electronic Signature(s) Signed: 10/16/2016 5:03:19 PM By: Montey Hora Entered By: Montey Hora on 10/16/2016 13:11:42 [name], [name] ([id number]) -------------------------------------------------------------------------------- Nutrition Risk Assessment Details Patient Name: [name] Date of Service: 10/16/2016 12:45 PM Medical Record Number: [id number] Patient Account Number: [id number] Date of Birth/Sex: 12-22-1960 (56 y.o. Male) Treating RN: Montey Hora Primary Care Legrande Hao: Sena Hitch Other Clinician: Referring Terrian Sentell: Annamaria Helling Treating Arely Tinner/Extender: Frann Rider in Treatment: 0 Height (in): 72 Weight (lbs): 131 Body Mass Index (BMI): 17.8 Nutrition Risk Assessment Items NUTRITION RISK SCREEN: I have an illness or condition that made me change the kind and/or 0 No amount of food I eat I eat fewer than two meals per day 0 No I eat few fruits and vegetables, or milk products 0 No I have three or more drinks of beer, liquor or wine almost every day 0 No I have  tooth or mouth problems that make it hard for me to eat 2 Yes I don't always have enough money to buy the food I need 0 No I eat alone most of the time 0 No I take three or more different prescribed or over-the-counter drugs a 1 Yes day Without wanting to, I have lost or gained 10 pounds in the last six 2 Yes months I am not always physically able to shop, cook and/or feed myself 0 No Nutrition Protocols Good Risk Protocol Provide education on Moderate Risk Protocol 0 nutrition Electronic Signature(s) Signed: 10/16/2016 5:03:19 PM By: Montey Hora Entered By: Montey Hora on 10/16/2016 13:12:40

## 2016-10-17 NOTE — Telephone Encounter (Signed)
Pt states Dr. Con Memos would like to know if his lungs would be able to stand a "bariatric chamber.". Please call.

## 2016-10-18 ENCOUNTER — Encounter: Payer: Self-pay | Admitting: *Deleted

## 2016-10-18 NOTE — Telephone Encounter (Signed)
LMOM for pt to return call. 

## 2016-10-18 NOTE — Telephone Encounter (Signed)
LMOM for pt to return my call since he called back.

## 2016-10-18 NOTE — Telephone Encounter (Signed)
Pt returning our call  ° °

## 2016-10-18 NOTE — Telephone Encounter (Signed)
Pt called back and I read message from [facility].  He is going to contact his cardiologist, then call us to schedule an appt for ED.

## 2016-10-18 NOTE — Telephone Encounter (Signed)
tried to call pt but got no answer again. LMOM stating that pt needed to be monitored per [facility] for the bariatric chamber. Also that I will send hi a message through mychart. Nothing further needed.

## 2016-10-30 ENCOUNTER — Other Ambulatory Visit: Payer: Self-pay | Admitting: *Deleted

## 2016-10-30 ENCOUNTER — Encounter: Payer: Medicaid Other | Attending: Surgery | Admitting: Surgery

## 2016-10-30 DIAGNOSIS — Z923 Personal history of irradiation: Secondary | ICD-10-CM | POA: Diagnosis not present

## 2016-10-30 DIAGNOSIS — Z79899 Other long term (current) drug therapy: Secondary | ICD-10-CM | POA: Diagnosis not present

## 2016-10-30 DIAGNOSIS — F17218 Nicotine dependence, cigarettes, with other nicotine-induced disorders: Secondary | ICD-10-CM | POA: Diagnosis not present

## 2016-10-30 DIAGNOSIS — J449 Chronic obstructive pulmonary disease, unspecified: Secondary | ICD-10-CM | POA: Insufficient documentation

## 2016-10-30 DIAGNOSIS — Z9221 Personal history of antineoplastic chemotherapy: Secondary | ICD-10-CM | POA: Insufficient documentation

## 2016-10-30 DIAGNOSIS — C9000 Multiple myeloma not having achieved remission: Secondary | ICD-10-CM | POA: Insufficient documentation

## 2016-10-30 DIAGNOSIS — I2510 Atherosclerotic heart disease of native coronary artery without angina pectoris: Secondary | ICD-10-CM | POA: Diagnosis not present

## 2016-10-30 DIAGNOSIS — Z7982 Long term (current) use of aspirin: Secondary | ICD-10-CM | POA: Insufficient documentation

## 2016-10-30 DIAGNOSIS — I420 Dilated cardiomyopathy: Secondary | ICD-10-CM | POA: Diagnosis not present

## 2016-10-30 DIAGNOSIS — Z95810 Presence of automatic (implantable) cardiac defibrillator: Secondary | ICD-10-CM | POA: Insufficient documentation

## 2016-10-30 DIAGNOSIS — F419 Anxiety disorder, unspecified: Secondary | ICD-10-CM | POA: Insufficient documentation

## 2016-10-30 DIAGNOSIS — E441 Mild protein-calorie malnutrition: Secondary | ICD-10-CM | POA: Diagnosis not present

## 2016-10-30 DIAGNOSIS — M278 Other specified diseases of jaws: Secondary | ICD-10-CM | POA: Insufficient documentation

## 2016-10-30 DIAGNOSIS — I10 Essential (primary) hypertension: Secondary | ICD-10-CM | POA: Insufficient documentation

## 2016-10-30 DIAGNOSIS — G629 Polyneuropathy, unspecified: Secondary | ICD-10-CM | POA: Insufficient documentation

## 2016-10-30 DIAGNOSIS — D649 Anemia, unspecified: Secondary | ICD-10-CM | POA: Insufficient documentation

## 2016-10-30 MED ORDER — FENTANYL 100 MCG/HR TD PT72
100.0000 ug | MEDICATED_PATCH | TRANSDERMAL | 0 refills | Status: DC
Start: 2016-10-30 — End: 2016-11-30

## 2016-10-30 MED ORDER — FENTANYL 25 MCG/HR TD PT72: 25.0000 ug | MEDICATED_PATCH | TRANSDERMAL | 0 refills | Status: DC

## 2016-10-31 ENCOUNTER — Telehealth: Payer: Self-pay | Admitting: Urology

## 2016-10-31 ENCOUNTER — Telehealth: Payer: Self-pay | Admitting: Internal Medicine

## 2016-10-31 ENCOUNTER — Other Ambulatory Visit: Payer: Self-pay | Admitting: *Deleted

## 2016-10-31 DIAGNOSIS — J449 Chronic obstructive pulmonary disease, unspecified: Secondary | ICD-10-CM

## 2016-10-31 DIAGNOSIS — C9002 Multiple myeloma in relapse: Secondary | ICD-10-CM

## 2016-10-31 NOTE — Telephone Encounter (Signed)
Pt girlfriend calling stating Dr Con Memos is requesting patient do a breathing test  For they need to put him in a HyperBaric Chamber and they would like to make sure he is okay to do this   Please advise.

## 2016-10-31 NOTE — Progress Notes (Addendum)
JAKYRIE, TOTHEROW ([id number]) Visit Report for 10/30/2016 Chief Complaint Document Details Patient Name: [name], [name] Date of Service: 10/30/2016 1:45 PM Medical Record Number: [id number] Patient Account Number: [id number] Date of Birth/Sex: 16-Nov-1960 (55 y.o. Male) Treating RN: Cornell Barman Primary Care Provider: Sena Hitch Other Clinician: Referring Provider: Sena Hitch Treating Provider/Extender: Frann Rider in Treatment: 2 Information Obtained from: Patient Chief Complaint Patient presents to the Wound Care center for HBO eval due to non healing wound his lower mandible bilaterally which he's had for about a year Electronic Signature(s) Signed: 10/30/2016 3:26:00 PM By: Christin Fudge MD, FACS Entered By: Christin Fudge on 10/30/2016 15:26:00 Laureano, [name] ([id number]) -------------------------------------------------------------------------------- HPI Details Patient Name: [name] Date of Service: 10/30/2016 1:45 PM Medical Record Number: [id number] Patient Account Number: [id number] Date of Birth/Sex: 10-23-1960 (55 y.o. Male) Treating RN: Cornell Barman Primary Care Provider: Sena Hitch Other Clinician: Referring Provider: Sena Hitch Treating Provider/Extender: Frann Rider in Treatment: 2 History of Present Illness Location: nonhealing wounds both lower mandible Quality: Patient reports experiencing a dull pain to affected area(s). Severity: Patient states wound are getting worse. Duration: Patient has had the wound for <12 months prior to presenting for treatment Timing: Pain in wound is Intermittent (comes and goes Context: The wound appeared gradually over time, after his teeth were extracted Modifying Factors: Other treatment(s) tried include:under the treatment of a maxillofacial surgeon HPI Description: This 56 year old gentleman who has been seen by [place] in the past for an unrelated problem, is known to have multiple  myeloma, anemia, COPD, peripheral neuropathy and pedal edema has now been seen by his maxillofacial physician Dr. Annamaria Helling who has referred him to [place] for treatment with hyperbaric oxygen therapy for ulcerative lesions intraorally along the tooth #17 through 22 and distal to tooth #27 there has been bony exposure. The patient has had removal of lateral exostosis in the right lower quadrant and the left lower quadrant he's had tooth extractions in the past. Radiographic radiograph taken demonstrated a mottled appearance of the bones especially in the crestal area of #28 as well as the distal to #23. 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 He is also been seen by Dr. Rogue Bussing, medical oncology, who is holding Revlimid for possible wound healing issues and osteonecrosis of the mandible Of note the patient is been seen by Dr. 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 10/30/2016 -- notes were received from Dr. Laurelyn Sickle who is the cardiologist looking after the patient. He was reviewed regarding his fitness for hyperbaric oxygen therapy and the echo and EKG were within normal limits and he is advised to proceed with the management. 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 Model is E160 serial number is [id number]. -- radiation oncology note received dated 11/04/2013 -- 56 year old male recently diagnosed with multiple myeloma with bone marrow biopsy performed on 09/30/2013 showed abnormal myeloma panel. He received treatment to the pelvis with 3000 cGy started on 10/16/2013 and completed on 10/29/2013. I had also discussed his case with Dr. 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 He is going to see him back tomorrow and we will get the official paperwork. pathology report showed a final diagnosis of left mandibular alveolar ridge nonviable bone, sequestrum, osteonecrosis with associated acute inflammation and bacterial debris. Comment was made that it was consistent with medication related osteonecrosis of the jaw [name], [name] A. ([id number]) The patient is still not seen his pulmonologist The patient continues to smoke ======== Old notes: The patient is under treatment with medical oncology Dr. Ma Hillock sees him, for multiple myeloma, anemia, COPD, peripheral neuropathy, pedal edema and has been referred to [place] for left lateral hip pressure ulcer. his past medical history significant for coronary artery disease, dilated cardiomyopathy with an EF of 20% and has had her AICD placement, kidney stones, asthma, COPD, polysubstance abuse, anxiety. continues to smoke a pack of cigarettes a day, history of recreational drug usage and used to take IV drugs in the late 1980s. He was in bed for a long while due to an associated illness and lay on his left side for prolonged periods of time. He has been ambulatory otherwise and has a fairly poor nutrition. 01/01/2015 -- I have reviewed the notes whether the patient was recently admitted to the hospital between November 30 and 12/27/2014. He was admitted for cellulitis of the left second finger, COPD exacerbation and hypotension and was found that no surgical debridement was required after a surgical consult by Dr. Adonis Huguenin. he was treated with injectable vancomycin and Zosyn initially and then sent home on oral Augmentin. ====== Electronic Signature(s) Signed: 10/30/2016 3:28:13 PM By: Christin Fudge MD, FACS Previous Signature: 10/30/2016 2:30:25 PM Version By: Christin Fudge MD,  FACS Previous Signature: 10/30/2016 2:14:57 PM Version By: Christin Fudge MD, FACS Previous Signature: 10/30/2016 2:12:14 PM Version By: Christin Fudge MD, FACS Entered By: Christin Fudge on 10/30/2016 15:28:13 [name], [name] ([id number]) -------------------------------------------------------------------------------- Physical Exam Details Patient Name: [name] Date of Service: 10/30/2016 1:45 PM Medical Record Number: [id number] Patient Account Number: [id number] Date of Birth/Sex: [birth year] (56 y.o. Male) Treating RN: Cornell Barman Primary Care Provider: Sena Hitch Other Clinician: Referring Provider: Sena Hitch Treating Provider/Extender: Frann Rider in Treatment: 2 Constitutional . Pulse regular. Respirations normal and unlabored. Afebrile. . Eyes Nonicteric. Reactive to light. Ears, Nose, Mouth, and Throat Lips, teeth, and gums WNL.Marland Kitchen Moist mucosa without lesions. Neck supple and nontender. No palpable supraclavicular or cervical adenopathy. Normal sized without goiter. Respiratory WNL. No retractions.. Cardiovascular Pedal Pulses WNL. No clubbing, cyanosis or edema. Gastrointestinal (GI) Abdomen without masses or tenderness.. No liver or spleen enlargement or tenderness.. Genitourinary (GU) No hydrocele, spermatocele, tenderness of the cord, or testicular mass.Marland Kitchen Penis without lesions.Lowella Fairy without lesions. No cystocele, or rectocele. Pelvic support intact, no discharge.Marland Kitchen Urethra without masses, tenderness or scarring.Marland Kitchen Lymphatic No adneopathy. No adenopathy. No adenopathy. Musculoskeletal Adexa without tenderness or enlargement.. Digits and nails w/o clubbing, cyanosis, infection, petechiae, ischemia, or inflammatory conditions.. Integumentary (Hair, Skin) No suspicious lesions. No crepitus or fluctuance. No peri-wound warmth or erythema. No masses.Marland Kitchen Psychiatric Judgement and insight Intact.. No evidence of depression, anxiety, or  agitation.. Notes no change in his examination and oral cavity examination not done today Electronic Signature(s) Signed: 10/30/2016 3:28:44 PM By: Christin Fudge MD, FACS Entered By: Christin Fudge on 10/30/2016 15:28:44 [name] ([id number]) [name] ([id number]) -------------------------------------------------------------------------------- Physician Orders Details Patient Name: [name] Date of Service: 10/30/2016 1:45 PM Medical Record Number: [id number] Patient Account Number: [id number] Date of Birth/Sex: 08-18-1960 (56 y.o. Male) Treating  RN: Cornell Barman Primary Care Provider: Sena Hitch Other Clinician: Referring Provider: Sena Hitch Treating Provider/Extender: Frann Rider in Treatment: 2 Verbal / Phone Orders: No Diagnosis Coding ICD-10 Coding Code Description M27.8 Other specified diseases of jaws C90.00 Multiple myeloma not having achieved remission F17.218 Nicotine dependence, cigarettes, with other nicotine-induced disorders E44.1 Mild protein-calorie malnutrition J44.9 Chronic obstructive pulmonary disease, unspecified Hyperbaric Oxygen Therapy o Evaluate for HBO Therapy - subject to cardio and pulmonary clearance o Indication: - osteoradionecrosis Electronic Signature(s) Signed: 10/30/2016 5:28:32 PM By: Gretta Cool, BSN, RN, CWS, Kim RN, BSN Signed: 10/31/2016 8:58:20 AM By: Christin Fudge MD, FACS Entered By: Gretta Cool, BSN, RN, CWS, Kim on 10/30/2016 17:28:32 [name] ([id number]) -------------------------------------------------------------------------------- Problem List Details Patient Name: [name] Date of Service: 10/30/2016 1:45 PM Medical Record Number: [id number] Patient Account Number: [id number] Date of Birth/Sex: [birth year] (56 y.o. Male) Treating RN: Cornell Barman Primary Care Provider: Sena Hitch Other Clinician: Referring Provider: Sena Hitch Treating Provider/Extender: Frann Rider in Treatment: 2 Active Problems ICD-10 Encounter Code Description Active Date Diagnosis M27.8 Other specified diseases of jaws 10/16/2016 Yes C90.00 Multiple myeloma not having achieved remission 10/16/2016 Yes F17.218 Nicotine dependence, cigarettes, with other nicotine- 10/16/2016 Yes induced disorders E44.1 Mild protein-calorie malnutrition 10/16/2016 Yes J44.9 Chronic obstructive pulmonary disease, unspecified 10/16/2016 Yes Inactive Problems Resolved Problems Electronic Signature(s) Signed: 10/30/2016 3:25:49 PM By: Christin Fudge MD, FACS Entered By: Christin Fudge on 10/30/2016 15:25:49 [name], [name] ([id number]) -------------------------------------------------------------------------------- Progress Note Details Patient Name: [name] Date of Service: 10/30/2016 1:45 PM Medical Record Number: [id number] Patient Account Number: [id number] Date of Birth/Sex: [birth year] (56 y.o. Male) Treating RN: Cornell Barman Primary Care Provider: Sena Hitch Other Clinician: Referring Provider: Sena Hitch Treating Provider/Extender: Frann Rider in Treatment: 2 Subjective Chief Complaint Information obtained from Patient Patient presents to the Wound Care center for HBO eval due to non healing wound his lower mandible bilaterally which he's had for about a year History of Present Illness (HPI) The following HPI elements were documented for the patient's wound: Location: nonhealing wounds both lower mandible Quality: Patient reports experiencing a dull pain to affected area(s). Severity: Patient states wound are getting worse. Duration: Patient has had the wound for Timing: Pain in wound is Intermittent (comes and goes Context: The wound appeared gradually over time, after his teeth were extracted Modifying Factors: Other treatment(s) tried include:under the treatment of a maxillofacial surgeon This 56 year old gentleman who has been seen by [place] in the  past for an unrelated problem, is known to have multiple myeloma, anemia, COPD, peripheral neuropathy and pedal edema has now been seen by his maxillofacial physician Dr. Annamaria Helling who has referred him to [place] for treatment with hyperbaric oxygen therapy for ulcerative lesions intraorally along the tooth #17 through 22 and distal to tooth #27 there has been bony exposure. The patient has had removal of lateral exostosis in the right lower quadrant and the left lower quadrant he's had tooth extractions in the past. Radiographic radiograph taken demonstrated a mottled appearance of the bones especially in the crestal area of #28 as well as the distal to #23. 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 He is also been seen by Dr. Rogue Bussing, medical oncology, who is holding Revlimid for possible wound healing issues and osteonecrosis of the mandible Of note the patient is been seen by Dr. Mortimer Fries, pulmonary and critical care, was sees him for end-stage COPD with  progressive chronic hypoxic respiratory failure in the setting of pulmonary cachexia and deconditioned state with ongoing tobacco abuse. 10/30/2016 -- notes were received from Dr. Laurelyn Sickle who is the cardiologist looking after the patient. He was reviewed regarding his fitness for hyperbaric oxygen therapy and the echo and EKG were within normal limits and he is advised to proceed with the management. 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 Model is E160 serial number is [id number]. -- radiation oncology note received dated 11/04/2013 -- 55 year old male recently diagnosed with multiple myeloma with bone marrow biopsy performed on 09/30/2013 showed abnormal myeloma panel. He [name], [name] A. ([id number]) received treatment to the pelvis with 3000 cGy started on 10/16/2013 and completed on 10/29/2013. I had also discussed his case with  Dr. 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 He is going to see him back tomorrow and we will get the official paperwork. pathology report showed a final diagnosis of left mandibular alveolar ridge nonviable bone, sequestrum, osteonecrosis with associated acute inflammation and bacterial debris. Comment was made that it was consistent with medication related osteonecrosis of the jaw The patient is still not seen his pulmonologist The patient continues to smoke ======== Old notes: The patient is under treatment with medical oncology Dr. Ma Hillock sees him, for multiple myeloma, anemia, COPD, peripheral neuropathy, pedal edema and has been referred to [place] for left lateral hip pressure ulcer. his past medical history significant for coronary artery disease, dilated cardiomyopathy with an EF of 20% and has had her AICD placement, kidney stones, asthma, COPD, polysubstance abuse, anxiety. continues to smoke a pack of cigarettes a day, history of recreational drug usage and used to take IV drugs in the late 1980s. He was in bed for a long while due to an associated illness and lay on his left side for prolonged periods of time. He has been ambulatory otherwise and has a fairly poor nutrition. 01/01/2015 -- I have reviewed the notes whether the patient was recently admitted to the hospital between November 30 and 12/27/2014. He was admitted for cellulitis of the left second finger, COPD exacerbation and hypotension and was found that no surgical debridement was required after a surgical consult by Dr. Adonis Huguenin. he was treated with injectable vancomycin and Zosyn initially and then sent home on oral Augmentin. ====== Objective Constitutional Pulse regular. Respirations normal and unlabored. Afebrile. Vitals Time  Taken: 1:50 PM, Height: 72 in, Weight: 131 lbs, BMI: 17.8, Temperature: 98.4 F, Pulse: 78 bpm, Respiratory Rate: 16 breaths/min, Blood Pressure: 98/62 mmHg. Eyes Nonicteric. Reactive to light. [name], [name] A. ([id number]) Ears, Nose, Mouth, and Throat Lips, teeth, and gums WNL.Marland Kitchen Moist mucosa without lesions. Neck supple and nontender. No palpable supraclavicular or cervical adenopathy. Normal sized without goiter. Respiratory WNL. No retractions.. Cardiovascular Pedal Pulses WNL. No clubbing, cyanosis or edema. Gastrointestinal (GI) Abdomen without masses or tenderness.. No liver or spleen enlargement or tenderness.. Genitourinary (GU) No hydrocele, spermatocele, tenderness of the cord, or testicular mass.Marland Kitchen Penis without lesions.Lowella Fairy without lesions. No cystocele, or rectocele. Pelvic support intact, no discharge.Marland Kitchen Urethra without masses, tenderness or scarring.Marland Kitchen Lymphatic No adneopathy. No adenopathy. No adenopathy. Musculoskeletal Adexa without tenderness or enlargement.. Digits and nails w/o clubbing, cyanosis, infection, petechiae, ischemia, or inflammatory conditions.Marland Kitchen Psychiatric Judgement and insight Intact.Marland Kitchen No  evidence of depression, anxiety, or agitation.. General Notes: no change in his examination and oral cavity examination not done today Integumentary (Hair, Skin) No suspicious lesions. No crepitus or fluctuance. No peri-wound warmth or erythema. No masses.. Assessment Active Problems ICD-10 M27.8 - Other specified diseases of jaws C90.00 - Multiple myeloma not having achieved remission F17.218 - Nicotine dependence, cigarettes, with other nicotine-induced disorders E44.1 - Mild protein-calorie malnutrition J44.9 - Chronic obstructive pulmonary disease, unspecified [name], [name] A. ([id number]) Plan Hyperbaric Oxygen Therapy: Evaluate for HBO Therapy - subject to cardio and pulmonary clearance Indication: - osteoradionecrosis This 56 year old gentleman  has osteonecrosis of the mandible possibly as a result of his medications with Zometa which is known to cause this issue. Recent pathology report confirms this. The patient has never received radiation therapy, to the mandible. The patient is rather feeble with end stages of COPD and we will have to evaluate him for the possibility of hyperbaric oxygen therapy. his pulmonary appointment is still pending. I have had a discussion with his maxillofacial surgeon and we will await further notes from his office His cardiologist has cleared him from the cardiac standpoint However, the patient's pulmonary function need to be thoroughly evaluated to see if he is going to be able to safely undergo hyperbaric oxygen therapy which will entail being in the chamber for 90 minutes 5 days a week for 6-8 weeks. this appointment is pending. The risks benefits alternatives and all the possible complications including cardiac issues, pulmonary issues, pneumothorax, seizure disorders and possible vision changes have been discussed with him in great detail. We will see him back in 2 weeks' time. Electronic Signature(s) Signed: 10/31/2016 8:57:36 AM By: Christin Fudge MD, FACS Previous Signature: 10/30/2016 3:33:01 PM Version By: Christin Fudge MD, FACS [name], [name] ([id number]) Entered By: Christin Fudge on 10/31/2016 08:57:35 [name], [name] ([id number]) -------------------------------------------------------------------------------- SuperBill Details Patient Name: [name] Date of Service: 10/30/2016 Medical Record Number: [id number] Patient Account Number: [id number] Date of Birth/Sex: [birth year] (55 y.o. Male) Treating RN: Cornell Barman Primary Care Provider: Sena Hitch Other Clinician: Referring Provider: Sena Hitch Treating Provider/Extender: Frann Rider in Treatment: 2 Diagnosis Coding ICD-10 Codes Code Description M27.8 Other specified diseases of jaws C90.00 Multiple  myeloma not having achieved remission F17.218 Nicotine dependence, cigarettes, with other nicotine-induced disorders E44.1 Mild protein-calorie malnutrition J44.9 Chronic obstructive pulmonary disease, unspecified Facility Procedures CPT4 Code: 60600459 Description: 97741 - WOUND CARE VISIT-LEV 1 EST PT Modifier: Quantity: 1 Physician Procedures CPT4 Code: 4239532 Description: 02334 - WC PHYS LEVEL 3 - EST PT ICD-10 Description Diagnosis M27.8 Other specified diseases of jaws C90.00 Multiple myeloma not having achieved remissio E44.1 Mild protein-calorie malnutrition J44.9 Chronic obstructive pulmonary disease,  unspec Modifier: n ified Quantity: 1 Electronic Signature(s) Signed: 10/30/2016 5:29:05 PM By: Gretta Cool, BSN, RN, CWS, Kim RN, BSN Signed: 10/31/2016 8:58:20 AM By: Christin Fudge MD, FACS Previous Signature: 10/30/2016 3:33:14 PM Version By: Christin Fudge MD, FACS Entered By: Gretta Cool, BSN, RN, CWS, Kim on 10/30/2016 17:29:05

## 2016-10-31 NOTE — Telephone Encounter (Signed)
Pt got cardiac clearance to be treated for ED.  Please give pt a call.

## 2016-11-01 ENCOUNTER — Other Ambulatory Visit: Payer: Self-pay | Admitting: Internal Medicine

## 2016-11-01 NOTE — Progress Notes (Addendum)
MUBASHIR, MALLEK ([id number]) Visit Report for 10/30/2016 Arrival Information Details Patient Name: [name], [name] Date of Service: 10/30/2016 1:45 PM Medical Record Number: [id number] Patient Account Number: [id number] Date of Birth/Sex: 09-Aug-1960 (56 y.o. Male) Treating RN: Cornell Barman Primary Care Yaelis Scharfenberg: Sena Hitch Other Clinician: Referring Draiden Mirsky: Sena Hitch Treating Hansika Leaming/Extender: Frann Rider in Treatment: 2 Visit Information History Since Last Visit Added or deleted any medications: No Patient Arrived: Ambulatory Any new allergies or adverse reactions: No Arrival Time: 13:47 Had a fall or experienced change in No Accompanied By: friend activities of daily living that may affect Transfer Assistance: None risk of falls: Patient Identification Verified: Yes Signs or symptoms of abuse/neglect since last visito No Secondary Verification Process Completed: Yes Hospitalized since last visit: No Patient Has Alerts: Yes Pain Present Now: No Patient Alerts: aspirin 325 Electronic Signature(s) Signed: 10/31/2016 5:44:12 PM By: Gretta Cool, BSN, RN, CWS, Kim RN, BSN Entered By: Gretta Cool, BSN, RN, CWS, Kim on 10/30/2016 13:49:31 Downs, [name] ([id number]) -------------------------------------------------------------------------------- Clinic Level of Care Assessment Details Patient Name: [name] Date of Service: 10/30/2016 1:45 PM Medical Record Number: [id number] Patient Account Number: [id number] Date of Birth/Sex: 1960-09-14 (56 y.o. Male) Treating RN: Cornell Barman Primary Care Maritsa Hunsucker: Sena Hitch Other Clinician: Referring Harvir Patry: Sena Hitch Treating Justus Droke/Extender: Frann Rider in Treatment: 2 Clinic Level of Care Assessment Items TOOL 3 Quantity Score []  - Use when EandM and Procedure is performed on FOLLOW-UP visit 0 ASSESSMENTS - Nursing Assessment / Reassessment []  - Reassessment of Co-morbidities (includes updates in  patient status) 0 []  - 0 Reassessment of Adherence to Treatment Plan ASSESSMENTS - Wound and Skin Assessment / Reassessment []  - Points for Wound Assessment can only be taken for a new wound of unknown or different 0 etiology and a procedure is NOT performed to that wound []  - 0 Simple Wound Assessment / Reassessment - one wound []  - 0 Complex Wound Assessment / Reassessment - multiple wounds []  - 0 Dermatologic / Skin Assessment (not related to wound area) ASSESSMENTS - Focused Assessment []  - Circumferential Edema Measurements - multi extremities 0 []  - 0 Nutritional Assessment / Counseling / Intervention []  - 0 Lower Extremity Assessment (monofilament, tuning fork, pulses) []  - 0 Peripheral Arterial Disease Assessment (using hand held doppler) ASSESSMENTS - Ostomy and/or Continence Assessment and Care []  - Incontinence Assessment and Management 0 []  - 0 Ostomy Care Assessment and Management (repouching, etc.) PROCESS - Coordination of Care []  - Points for Discharge Coordination can only be taken for a new wound of unknown or different 0 etiology and a procedure is NOT performed to that wound []  - 0 Simple Patient / Family Education for ongoing care []  - 0 Complex (extensive) Patient / Family Education for ongoing care []  - 0 Staff obtains Programmer, systems, Records, Test Results / Process Orders []  - 0 Staff telephones HHA, Nursing Homes / Clarify orders / etc []  - 0 Routine Transfer to another Facility (non-emergent condition) []  - 0 Routine Hospital Admission (non-emergent condition) Hutmacher, Maccoy A. ([id number]) []  - 0 New Admissions / Biomedical engineer / Ordering NPWT, Apligraf, etc. []  - 0 Emergency Hospital Admission (emergent condition) []  - 0 Simple Discharge Coordination []  - 0 Complex (extensive) Discharge Coordination PROCESS - Special Needs []  - Pediatric / Minor Patient Management 0 []  - 0 Isolation Patient Management []  - 0 Hearing / Language /  Visual special needs []  - 0 Assessment of Community assistance (transportation, D/C planning, etc.) []  - 0 Additional assistance /  Altered mentation []  - 0 Support Surface(s) Assessment (bed, cushion, seat, etc.) INTERVENTIONS - Wound Cleansing / Measurement []  - Points for Wound Cleaning / Measurement, Wound Dressing, Specimen Collection and 0 Specimen taken to lab can only be taken for a new wound of unknown or different etiology and a procedure is NOT performed to that wound []  - 0 Simple Wound Cleansing - one wound []  - 0 Complex Wound Cleansing - multiple wounds []  - 0 Wound Imaging (photographs - any number of wounds) []  - 0 Wound Tracing (instead of photographs) []  - 0 Simple Wound Measurement - one wound []  - 0 Complex Wound Measurement - multiple wounds INTERVENTIONS - Wound Dressings []  - Small Wound Dressing one or multiple wounds 0 []  - 0 Medium Wound Dressing one or multiple wounds []  - 0 Large Wound Dressing one or multiple wounds INTERVENTIONS - Miscellaneous []  - External ear exam 0 []  - 0 Specimen Collection (cultures, biopsies, blood, body fluids, etc.) []  - 0 Specimen(s) / Culture(s) sent or taken to Lab for analysis []  - 0 Patient Transfer (multiple staff / Civil Service fast streamer / Similar devices) []  - 0 Simple Staple / Suture removal (25 or less) []  - 0 Complex Staple / Suture removal (26 or more) Baty, Random A. ([id number]) []  - 0 Hypo / Hyperglycemic Management (close monitor of Blood Glucose) []  - 0 Ankle / Brachial Index (ABI) - do not check if billed separately X- 1 5 Vital Signs Has the patient been seen at the hospital within the last three years: Yes Total Score: 5 Level Of Care: New/Established - Level 1 Electronic Signature(s) Signed: 10/31/2016 5:44:12 PM By: Gretta Cool, BSN, RN, CWS, Kim RN, BSN Entered By: Gretta Cool, BSN, RN, CWS, Kim on 10/30/2016 17:28:54 [name]  ([id number]) -------------------------------------------------------------------------------- Lower Extremity Assessment Details Patient Name: [name] Date of Service: 10/30/2016 1:45 PM Medical Record Number: [id number] Patient Account Number: [id number] Date of Birth/Sex: 08-08-1960 (55 y.o. Male) Treating RN: Cornell Barman Primary Care Laraina Sulton: Sena Hitch Other Clinician: Referring Aayliah Rotenberry: Sena Hitch Treating Maymunah Stegemann/Extender: Frann Rider in Treatment: 2 Electronic Signature(s) Signed: 10/31/2016 5:44:12 PM By: Gretta Cool, BSN, RN, CWS, Kim RN, BSN Entered By: Gretta Cool, BSN, RN, CWS, Kim on 10/30/2016 13:52:24 Isidore, [name] ([id number]) -------------------------------------------------------------------------------- Multi Wound Chart Details Patient Name: [name] Date of Service: 10/30/2016 1:45 PM Medical Record Number: [id number] Patient Account Number: [id number] Date of Birth/Sex: 1960/06/09 (55 y.o. Male) Treating RN: Cornell Barman Primary Care Meko Masterson: Sena Hitch Other Clinician: Referring Javonta Gronau: Sena Hitch Treating Sebasthian Stailey/Extender: Frann Rider in Treatment: 2 Vital Signs Height(in): 72 Pulse(bpm): 78 Weight(lbs): 131 Blood Pressure(mmHg): 98/62 Body Mass Index(BMI): 18 Temperature(F): 98.4 Respiratory Rate 16 (breaths/min): Wound Assessments Treatment Notes Electronic Signature(s) Signed: 10/30/2016 5:28:13 PM By: Gretta Cool, BSN, RN, CWS, Kim RN, BSN Previous Signature: 10/30/2016 3:25:54 PM Version By: Christin Fudge MD, FACS Entered By: Gretta Cool, BSN, RN, CWS, Kim on 10/30/2016 17:28:13 Dasher, [name] ([id number]) -------------------------------------------------------------------------------- [place] Details Patient Name: [name] Date of Service: 10/30/2016 1:45 PM Medical Record Number: [id number] Patient Account Number: [id number] Date of Birth/Sex: 1960/07/20 (56 y.o. Male) Treating  RN: Cornell Barman Primary Care Karver Fadden: Sena Hitch Other Clinician: Referring Seaborn Nakama: Sena Hitch Treating Kimber Esterly/Extender: Frann Rider in Treatment: 2 Active Inactive Electronic Signature(s) Signed: 12/20/2016 8:30:08 AM By: Gretta Cool, BSN, RN, CWS, Kim RN, BSN Previous Signature: 10/30/2016 5:28:05 PM Version By: Gretta Cool, BSN, RN, CWS, Kim RN, BSN Entered By: Gretta Cool, BSN, RN, CWS, Kim on 12/06/2016 13:58:11 Reza,  Lyle AMarland Kitchen ([id number]) -------------------------------------------------------------------------------- Pain Assessment Details Patient Name: AIMEE, TIMMONS Date of Service: 10/30/2016 1:45 PM Medical Record Number: [id number] Patient Account Number: [id number] Date of Birth/Sex: 09-29-1960 (56 y.o. Male) Treating RN: Cornell Barman Primary Care Hermilo Dutter: Sena Hitch Other Clinician: Referring Kareena Arrambide: Sena Hitch Treating Carlea Badour/Extender: Frann Rider in Treatment: 2 Active Problems Location of Pain Severity and Description of Pain Patient Has Paino No Site Locations With Dressing Change: No Pain Management and Medication Current Pain Management: Electronic Signature(s) Signed: 10/31/2016 5:44:12 PM By: Gretta Cool, BSN, RN, CWS, Kim RN, BSN Entered By: Gretta Cool, BSN, RN, CWS, Kim on 10/30/2016 13:49:49 [name] ([id number]) -------------------------------------------------------------------------------- Patient/Caregiver Education Details Patient Name: [name] Date of Service: 10/30/2016 1:45 PM Medical Record Number: [id number] Patient Account Number: [id number] Date of Birth/Gender: [birth year] (55 y.o. Male) Treating RN: Cornell Barman Primary Care Physician: Sena Hitch Other Clinician: Referring Physician: Sena Hitch Treating Physician/Extender: Frann Rider in Treatment: 2 Education Assessment Education Provided To: Patient Education Topics Provided Hyperbaric Oxygenation: Handouts: Hyperbaric  Oxygen Methods: Demonstration, Explain/Verbal Responses: State content correctly Electronic Signature(s) Signed: 10/31/2016 5:44:12 PM By: Gretta Cool, BSN, RN, CWS, Kim RN, BSN Entered By: Gretta Cool, BSN, RN, CWS, Kim on 10/30/2016 17:29:22 [name] ([id number]) -------------------------------------------------------------------------------- Vitals Details Patient Name: [name] Date of Service: 10/30/2016 1:45 PM Medical Record Number: [id number] Patient Account Number: [id number] Date of Birth/Sex: [birth year] (56 y.o. Male) Treating RN: Cornell Barman Primary Care Adabella Stanis: Sena Hitch Other Clinician: Referring Amit Meloy: Sena Hitch Treating Adalee Kathan/Extender: Frann Rider in Treatment: 2 Vital Signs Time Taken: 13:50 Temperature (F): 98.4 Height (in): 72 Pulse (bpm): 78 Weight (lbs): 131 Respiratory Rate (breaths/min): 16 Body Mass Index (BMI): 17.8 Blood Pressure (mmHg): 98/62 Reference Range: 80 - 120 mg / dl Electronic Signature(s) Signed: 10/31/2016 5:44:12 PM By: Gretta Cool, BSN, RN, CWS, Kim RN, BSN Entered By: Gretta Cool, BSN, RN, CWS, Kim on 10/30/2016 13:52:14

## 2016-11-01 NOTE — Telephone Encounter (Signed)
PFT has been ordered. Nothing further needed.

## 2016-11-01 NOTE — Telephone Encounter (Signed)
Cardiologist does not want the patient to take PDE-5 inhibitors.  He will need an office visit to discuss other options.

## 2016-11-01 NOTE — Telephone Encounter (Signed)
If that what's recommended, then yes

## 2016-11-06 ENCOUNTER — Other Ambulatory Visit: Payer: Self-pay | Admitting: Oncology

## 2016-11-06 DIAGNOSIS — Z5189 Encounter for other specified aftercare: Secondary | ICD-10-CM

## 2016-11-06 DIAGNOSIS — R110 Nausea: Secondary | ICD-10-CM

## 2016-11-07 ENCOUNTER — Ambulatory Visit: Payer: Medicaid Other

## 2016-11-07 ENCOUNTER — Telehealth: Payer: Self-pay | Admitting: Internal Medicine

## 2016-11-07 NOTE — Telephone Encounter (Signed)
Contacted patient and gave patient phone number to R/S PFT due to his issues with transportation. Rhonda J Cobb

## 2016-11-07 NOTE — Telephone Encounter (Signed)
Patient needs to rs pft for today no transportation please call

## 2016-11-08 NOTE — Telephone Encounter (Signed)
Pt contacted Pamala Hurry in specials and cancelled the PFT and did not R/S at this time.  Pt advised Pamala Hurry that he would contact her back once he could arrange transportation.  Nothing else needed at this time. Rhonda J Cobb

## 2016-11-09 ENCOUNTER — Inpatient Hospital Stay (HOSPITAL_BASED_OUTPATIENT_CLINIC_OR_DEPARTMENT_OTHER): Payer: Medicaid Other | Admitting: Internal Medicine

## 2016-11-09 ENCOUNTER — Inpatient Hospital Stay: Payer: Medicaid Other | Attending: Internal Medicine

## 2016-11-09 ENCOUNTER — Other Ambulatory Visit: Payer: Self-pay

## 2016-11-09 VITALS — BP 103/72 | HR 91 | Temp 97.7°F | Resp 24 | Ht 72.0 in | Wt 131.4 lb

## 2016-11-09 DIAGNOSIS — K219 Gastro-esophageal reflux disease without esophagitis: Secondary | ICD-10-CM | POA: Insufficient documentation

## 2016-11-09 DIAGNOSIS — I2510 Atherosclerotic heart disease of native coronary artery without angina pectoris: Secondary | ICD-10-CM

## 2016-11-09 DIAGNOSIS — C9002 Multiple myeloma in relapse: Secondary | ICD-10-CM | POA: Insufficient documentation

## 2016-11-09 DIAGNOSIS — T50995S Adverse effect of other drugs, medicaments and biological substances, sequela: Secondary | ICD-10-CM | POA: Diagnosis not present

## 2016-11-09 DIAGNOSIS — Z9114 Patient's other noncompliance with medication regimen: Secondary | ICD-10-CM | POA: Insufficient documentation

## 2016-11-09 DIAGNOSIS — Z8042 Family history of malignant neoplasm of prostate: Secondary | ICD-10-CM | POA: Insufficient documentation

## 2016-11-09 DIAGNOSIS — E876 Hypokalemia: Secondary | ICD-10-CM | POA: Insufficient documentation

## 2016-11-09 DIAGNOSIS — M87180 Osteonecrosis due to drugs, jaw: Secondary | ICD-10-CM | POA: Insufficient documentation

## 2016-11-09 DIAGNOSIS — F419 Anxiety disorder, unspecified: Secondary | ICD-10-CM

## 2016-11-09 DIAGNOSIS — Z79899 Other long term (current) drug therapy: Secondary | ICD-10-CM | POA: Insufficient documentation

## 2016-11-09 DIAGNOSIS — J449 Chronic obstructive pulmonary disease, unspecified: Secondary | ICD-10-CM | POA: Insufficient documentation

## 2016-11-09 DIAGNOSIS — C9000 Multiple myeloma not having achieved remission: Secondary | ICD-10-CM

## 2016-11-09 DIAGNOSIS — Z7982 Long term (current) use of aspirin: Secondary | ICD-10-CM | POA: Insufficient documentation

## 2016-11-09 DIAGNOSIS — F17210 Nicotine dependence, cigarettes, uncomplicated: Secondary | ICD-10-CM

## 2016-11-09 DIAGNOSIS — E785 Hyperlipidemia, unspecified: Secondary | ICD-10-CM | POA: Diagnosis not present

## 2016-11-09 DIAGNOSIS — I252 Old myocardial infarction: Secondary | ICD-10-CM | POA: Insufficient documentation

## 2016-11-09 DIAGNOSIS — G8929 Other chronic pain: Secondary | ICD-10-CM | POA: Insufficient documentation

## 2016-11-09 DIAGNOSIS — I509 Heart failure, unspecified: Secondary | ICD-10-CM | POA: Insufficient documentation

## 2016-11-09 DIAGNOSIS — Z95810 Presence of automatic (implantable) cardiac defibrillator: Secondary | ICD-10-CM | POA: Insufficient documentation

## 2016-11-09 DIAGNOSIS — R6882 Decreased libido: Secondary | ICD-10-CM | POA: Diagnosis not present

## 2016-11-09 LAB — COMPREHENSIVE METABOLIC PANEL
ALK PHOS: 41 U/L (ref 38–126)
ALT: 9 U/L — ABNORMAL LOW (ref 17–63)
ANION GAP: 9 (ref 5–15)
AST: 16 U/L (ref 15–41)
Albumin: 4.3 g/dL (ref 3.5–5.0)
BUN: 9 mg/dL (ref 6–20)
CALCIUM: 9.4 mg/dL (ref 8.9–10.3)
CO2: 31 mmol/L (ref 22–32)
CREATININE: 0.94 mg/dL (ref 0.61–1.24)
Chloride: 96 mmol/L — ABNORMAL LOW (ref 101–111)
GFR calc non Af Amer: >60 mL/min (ref >60)
Glucose, Bld: 95 mg/dL (ref 65–99)
Potassium: 3.9 mmol/L (ref 3.5–5.1)
Sodium: 136 mmol/L (ref 135–145)
Total Bilirubin: 0.5 mg/dL (ref 0.3–1.2)
Total Protein: 8 g/dL (ref 6.5–8.1)

## 2016-11-09 LAB — CBC WITH DIFFERENTIAL/PLATELET
Basophils Absolute: 0 10*3/uL (ref 0–0.1)
Basophils Relative: 1 %
Eosinophils Absolute: 0.2 10*3/uL (ref 0–0.7)
Eosinophils Relative: 3 %
HEMATOCRIT: 39.4 % — AB (ref 40.0–52.0)
HEMOGLOBIN: 13.4 g/dL (ref 13.0–18.0)
LYMPHS ABS: 1.6 10*3/uL (ref 1.0–3.6)
LYMPHS PCT: 26 %
MCH: 33.3 pg (ref 26.0–34.0)
MCHC: 34 g/dL (ref 32.0–36.0)
MCV: 97.9 fL (ref 80.0–100.0)
MONOS PCT: 11 %
Monocytes Absolute: 0.7 10*3/uL (ref 0.2–1.0)
NEUTROS PCT: 59 %
Neutro Abs: 3.5 10*3/uL (ref 1.4–6.5)
PLATELETS: 293 10*3/uL (ref 150–440)
RBC: 4.02 MIL/uL — AB (ref 4.40–5.90)
RDW: 14.3 % (ref 11.5–14.5)
WBC: 6 10*3/uL (ref 3.8–10.6)

## 2016-11-09 MED ORDER — ALPRAZOLAM 0.5 MG PO TABS: 0.5000 mg | ORAL_TABLET | Freq: Three times a day (TID) | ORAL | 1 refills | Status: DC | PRN

## 2016-11-09 NOTE — Progress Notes (Signed)
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  Patient reports that he is still waiting to hear back from Dr. Humphrey Rolls for cardiac clearance. He has an apt upcoming with Dr. Mortimer Fries.  Pt's girlfriend is currently pregnant. After asking open ended questions, girlfriend states that this is not the pt's child. Her baby is due to be delivered in the next few months. Discussed using protection during sexual relations and risk for embryo-fetal exposure/Birth defects (deformed babies) or death of an unborn baby and drug exposure to Revlimid. Patient states that he sexually active with his girlfriend. Pt's girlfriend stated that she is already aware of this and they always use protection. Extensively discussed and stressed the importance of the ongoing use of protection during sex even more so s/p delivery of baby due to high risk for fertility. Also discussed using 1 highly effective method and at least 1 additional effective method (such as birth control) s/p pregnancy and every time girlfriend has sex with the pt. Discussed that abstinece is 100% effective to prevent birth defects/drug exposure. Girlfriend - states that she is fully aware of the risk factors and knows the Revlimid could remain in pt's blood stream   Dr. Rogue Bussing also made aware of this discussion with the patient.

## 2016-11-09 NOTE — Assessment & Plan Note (Addendum)
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 Currently on maintenance Revlimid- 25 mg 3 weeks on and one-week off. AUG  2018- M protein-ABSENT; K/L- slightly abnormal.  # No concerns for progression noted this time. However recommend holding Revlimid [given wound healing issues/left jaw osteonecrosis of the jaw-C discussion below].   #  Osteonecrosis of the jaw- Denosumab discontinued. Hold Revlimid- for possible wound healing issues.  Hyperbaric oxygen- awaiting at this time.   # chronic pain/anxiety- on fentanyl [137.71mg] and Dilaudid; Xanax. Recommend follow-up with psychiatry.  # fatigue/erectile dynfunsction- Testosterone low; followed up with Urology.   # hypokalemia -severe 2.7; recommend continued Kdur BID; new prescription sent.  # follow up in 2 months/ cbc/cmp/m-panel labs. HOLD revlimid.   Cc; Dr.khan; [facility]; [name].

## 2016-11-09 NOTE — Progress Notes (Signed)
[name] OFFICE PROGRESS NOTE  Patient Care Team: Petra Kuba, MD as PCP - General (Family Medicine)   SUMMARY OF ONCOLOGIC HISTORY:  Oncology History   # SEP 2015- MULTIPLE MYELOMA [multiple PET pos Bone lesions; hypercalcemia s/p BMBx; FISH- Aneuploidy - gain of chromosome 7,9,15,FGFR3/4p16.3, and CCND1/11q13. Loss of MAF/16q23.1), cytogenetics normal 46XY] s/p Vel-Dex-Rev-Zometa; Excellent PR;   # MARCH 2016- REV-DEX ; FEB 2017-[discn Dex] Rev 25 mg 3 w On & 1 w Off; March 2017- M- protein 0.4gm/dl; K/L= 7.9; cont Rev;   # Revlimid on June 2018 [sec to ONJ]  # March 2017-  Left Middle Lobe cavitary lesion- ~27m- repeat Ct in 3-459m# AUG 2017- ONJ [[name], [place] #  ? ONJ- [phone] not follow up ]- DISCONT Zometa.   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 He is currently being evaluated by [facility] for hyperbaric oxygen for his osteonecrosis of the jaw.  In the interim he has been evaluated by urology for a testosterone/loss of libido. His recommended repeat testosterone evaluation before starting supplementation.  Patient admits to noncompliance with his potassium supplementation. However denies any diarrhea. He continues to complain of chronic cough chronic shortness of breath. Not any worse. Unfortunately continues to smoke. States is cutting down.  REVIEW OF SYSTEMS:  A complete 10 point review of system is done which is negative except mentioned above/history of present illness.   PAST MEDICAL HISTORY :  Past Medical History:  Diagnosis Date  . Anxiety   . Arthritis   . Atrial septal defect   . CHF (congestive heart failure) ([facility]   . COPD (chronic obstructive pulmonary disease) (HCL[place]  . Coronary artery disease   . Dysrhythmias   . GERD (gastroesophageal reflux disease)   . Hyperlipidemia   . Multiple myeloma ([facility]CR[place]  . Multiple myeloma ([facility]  . Myocardial infarction ([facility]CC[place]  . Substance abuse ([facility]CC[place]    PAST SURGICAL HISTORY :   Past Surgical History:  Procedure Laterality Date  . CARDIAC DEFIBRILLATOR PLACEMENT      FAMILY HISTORY :   Family History  Problem Relation Age of Onset  . COPD Mother   . CAD Mother   . Heart attack Father   . Prostate cancer Maternal Grandfather   . Kidney cancer Neg Hx   . Bladder Cancer Neg Hx     SOCIAL HISTORY:   Social History  Substance Use Topics  . Smoking status: Current Every Day Smoker    Packs/day: 0.25    Years: 40.00    Types: Cigarettes  . Smokeless tobacco: Never Used  . Alcohol use No    ALLERGIES:  has No Known Allergies.  MEDICATIONS:  Current Outpatient Prescriptions  Medication Sig Dispense Refill  . acetaminophen (TYLENOL) 325 MG tablet Take 2 tablets (650 mg total) by mouth every 6 (six) hours as needed for mild pain (or Fever >/= 101).    . Marland Kitchenlbuterol (PROVENTIL HFA;VENTOLIN HFA) 108 (90 Base) MCG/ACT inhaler Inhale 2 puffs into the lungs every 6 (six) hours as needed for wheezing or shortness of breath. 1 Inhaler 1  . ALPRAZolam (XANAX) 0.5 MG tablet Take 1 tablet (0.5  mg total) by mouth 3 (three) times daily as needed for anxiety. 90 tablet 1  . aspirin 325 MG EC tablet Take 325 mg by mouth daily.    Marland Kitchen atorvastatin (LIPITOR) 20 MG tablet Take 20 mg by mouth daily.  2  . DULoxetine (CYMBALTA) 30 MG capsule TAKE ONE CAPSULE BY MOUTH EVERY DAY 90 capsule 1  . feeding supplement, ENSURE ENLIVE, (ENSURE ENLIVE) LIQD Take 237 mLs by mouth 3 (three) times daily between meals. 90 Bottle 1  . fentaNYL (DURAGESIC - DOSED MCG/HR) 100 MCG/HR Place 1 patch (100 mcg total) onto the skin every other day. Use with a 25 mcg patch for total dose of 125 mcg  15 patch 0  . fentaNYL (DURAGESIC - DOSED MCG/HR) 25 MCG/HR patch Place 1 patch (25 mcg total) onto the skin every other day. Use with a 100 mcg patch for total dose of 125 mcg 15 patch 0  . furosemide (LASIX) 20 MG tablet Take 20 mg by mouth daily.  0  . gabapentin (NEURONTIN) 100 MG capsule TAKE TWO CAPSULES BY MOUTH THREE TIMES DAILY 180 capsule 3  . ipratropium-albuterol (DUONEB) 0.5-2.5 (3) MG/3ML SOLN Take 3 mLs by nebulization every 4 (four) hours as needed. 360 mL 3  . metoprolol tartrate (LOPRESSOR) 50 MG tablet Take 50 mg by mouth 2 (two) times daily.  2  . ondansetron (ZOFRAN) 4 MG tablet Take 1 tablet (4 mg total) by mouth every 8 (eight) hours as needed for nausea or vomiting. 21 tablet 3  . OXYGEN Inhale 3 L into the lungs daily.    . polyethylene glycol (MIRALAX / GLYCOLAX) packet Take 17 g by mouth daily. 30 each 0  . tiotropium (SPIRIVA) 18 MCG inhalation capsule Place 1 capsule (18 mcg total) into inhaler and inhale daily. 30 capsule 12  . varenicline (CHANTIX CONTINUING MONTH PAK) 1 MG tablet Take 1 tablet (1 mg total) by mouth 2 (two) times daily. 30 tablet 3  . guaiFENesin-codeine (ROBITUSSIN AC) 100-10 MG/5ML syrup Take 5 mLs by mouth 3 (three) times daily as needed for cough. (Patient not taking: Reported on 11/09/2016) 120 mL 0  . HYDROmorphone (DILAUDID) 2 MG tablet Take 1 tablet (2 mg total) by mouth every 6 (six) hours as needed for severe pain. 120 tablet 0  . lenalidomide (REVLIMID) 25 MG capsule Take 1 capsule (25 mg total) by mouth as directed. 1 capsule daily for 21 days and 1 week off (Patient not taking: Reported on 11/09/2016) 21 capsule 0  . varenicline (CHANTIX STARTING MONTH PAK) 0.5 MG X 11 & 1 MG X 42 tablet Take one 0.5 mg tablet by mouth once daily for 3 days, then increase to one 0.5 mg tablet twice daily for 4 days, then increase to one 1 mg tablet twice daily. 53 tablet 0   No current facility-administered medications for this visit.     Facility-Administered Medications Ordered in Other Visits  Medication Dose Route Frequency Provider Last Rate Last Dose  . 0.9 %  sodium chloride infusion   Intravenous Continuous Cammie Sickle, MD 10 mL/hr at 02/24/15 1520      PHYSICAL EXAMINATION: ECOG PERFORMANCE STATUS: 2 - Symptomatic, <50% confined to bed  BP 103/72 (BP Location: Left Arm, Patient Position: Sitting)   Pulse 91   Temp 97.7 F (36.5 C) (Tympanic)   Resp (!) 24   Ht 6' (1.829 m)   Wt 131 lb 6.4 oz (59.6 kg)   BMI 17.82 kg/m   Filed  Weights   11/09/16 1147  Weight: 131 lb 6.4 oz (59.6 kg)    GENERAL: Thin build cachectic-appearing Caucasian male patient. Alert, in  Mild- nod pain; .  Accompanied by friend/care giver.  he is walking by himself EYES: no pallor or icterus OROPHARYNX: Jaw bone exposed left lower jaw poor dentition. NECK: supple, no masses felt LYMPH:  no palpable lymphadenopathy in the cervical, axillary or inguinal regions LUNGS: With decreased bilateral breath sounds. No wheeze or crackles HEART/CVS: regular rate & rhythm and no murmurs;  No lower extremity edema. ABDOMEN:abdomen soft, non-tender and normal bowel sounds Musculoskeletal:no cyanosis of digits and no clubbing  PSYCH: alert & oriented x 3 with fluent speech NEURO: no focal motor/sensory deficits SKIN:  no rashes or significant lesions  LABORATORY DATA:  I have reviewed the data as listed    Component Value Date/Time   NA 136 11/09/2016 1104   NA 135 05/12/2014 1343   K 3.9 11/09/2016 1104   K 3.3 (L) 05/12/2014 1343   CL 96 (L) 11/09/2016 1104   CL 101 05/12/2014 1343   CO2 31 11/09/2016 1104   CO2 27 05/12/2014 1343   GLUCOSE 95 11/09/2016 1104   GLUCOSE 77 05/12/2014 1343   BUN 9 11/09/2016 1104   BUN 9 05/12/2014 1343   CREATININE 0.94 11/09/2016 1104   CREATININE 0.89 05/12/2014 1343   CALCIUM 9.4 11/09/2016 1104   CALCIUM 8.8 (L) 05/12/2014 1343   PROT 8.0 11/09/2016 1104   PROT 5.9 (L)  02/17/2014 1405   ALBUMIN 4.3 11/09/2016 1104   ALBUMIN 2.8 (L) 02/17/2014 1405   AST 16 11/09/2016 1104   AST 9 (L) 02/17/2014 1405   ALT 9 (L) 11/09/2016 1104   ALT 12 (L) 02/17/2014 1405   ALKPHOS 41 11/09/2016 1104   ALKPHOS 44 (L) 02/17/2014 1405   BILITOT 0.5 11/09/2016 1104   BILITOT 0.2 02/17/2014 1405   GFRNONAA >60 11/09/2016 1104   GFRNONAA >60 05/12/2014 1343   GFRAA >60 11/09/2016 1104   GFRAA >60 05/12/2014 1343    No results found for: SPEP, UPEP  Lab Results  Component Value Date   WBC 6.0 11/09/2016   NEUTROABS 3.5 11/09/2016   HGB 13.4 11/09/2016   HCT 39.4 (L) 11/09/2016   MCV 97.9 11/09/2016   PLT 293 11/09/2016      Chemistry      Component Value Date/Time   NA 136 11/09/2016 1104   NA 135 05/12/2014 1343   K 3.9 11/09/2016 1104   K 3.3 (L) 05/12/2014 1343   CL 96 (L) 11/09/2016 1104   CL 101 05/12/2014 1343   CO2 31 11/09/2016 1104   CO2 27 05/12/2014 1343   BUN 9 11/09/2016 1104   BUN 9 05/12/2014 1343   CREATININE 0.94 11/09/2016 1104   CREATININE 0.89 05/12/2014 1343      Component Value Date/Time   CALCIUM 9.4 11/09/2016 1104   CALCIUM 8.8 (L) 05/12/2014 1343   ALKPHOS 41 11/09/2016 1104   ALKPHOS 44 (L) 02/17/2014 1405   AST 16 11/09/2016 1104   AST 9 (L) 02/17/2014 1405   ALT 9 (L) 11/09/2016 1104   ALT 12 (L) 02/17/2014 1405   BILITOT 0.5 11/09/2016 1104   BILITOT 0.2 02/17/2014 1405      Results for MAXIMO, SPRATLING A (MRN [id number]) as of 11/29/2015 16:11  Ref. Range 07/13/2015 12:38 07/14/2015 14:00 09/02/2015 13:15 09/09/2015 13:33 11/08/2015 13:50  M Protein SerPl Elph-Mcnc Latest Ref Range: Not Observed g/dL  0.3 (H) 0.3 (H)  Results for LOVETT, COFFIN (MRN [id number]) as of 11/29/2015 16:11  Ref. Range 01/06/2015 09:43 04/05/2015 09:09 06/02/2015 13:20 09/09/2015 13:33 11/08/2015 13:50  Kappa, lamda light chain ratio Latest Ref Range: 0.26 - 1.65  10.07 (H) 7.95 (H) 5.62 (H) 3.04 (H) 2.74 (H)       ASSESSMENT & PLAN:    Multiple myeloma in relapse ([place]) # MULTIPLE MYELOMA- status post induction Velcade Revlimid and dexamethasone. Currently on maintenance Revlimid- 25 mg 3 weeks on and one-week off. AUG  2018- M protein-ABSENT; K/L- slightly abnormal.  # No concerns for progression noted this time. However recommend holding Revlimid [given wound healing issues/left jaw osteonecrosis of the jaw-C discussion below].   #  Osteonecrosis of the jaw- Denosumab discontinued. Hold Revlimid- for possible wound healing issues.  Hyperbaric oxygen- awaiting at this time.   # chronic pain/anxiety- on fentanyl [137.90mg] and Dilaudid; Xanax. Recommend follow-up with psychiatry.  # fatigue/erectile dynfunsction- Testosterone low; followed up with Urology.   # hypokalemia -severe 2.7; recommend continued Kdur BID; new prescription sent.  # follow up in 2 months/ cbc/cmp/m-panel labs. HOLD revlimid.   Cc; Dr.khan; [facility]; [name].      GCammie Sickle MD 11/20/2016 3:56 PM

## 2016-11-10 LAB — KAPPA/LAMBDA LIGHT CHAINS
Kappa free light chain: 34.7 mg/L — ABNORMAL HIGH (ref 3.3–19.4)
Kappa, lambda light chain ratio: 2.36 — ABNORMAL HIGH (ref 0.26–1.65)
Lambda free light chains: 14.7 mg/L (ref 5.7–26.3)

## 2016-11-10 NOTE — Telephone Encounter (Signed)
Patient notified he will need to schedule OV. He will call back and schedule appointment

## 2016-11-13 LAB — MULTIPLE MYELOMA PANEL, SERUM
ALPHA2 GLOB SERPL ELPH-MCNC: 0.9 g/dL (ref 0.4–1.0)
Albumin SerPl Elph-Mcnc: 3.4 g/dL (ref 2.9–4.4)
Albumin/Glob SerPl: 1 (ref 0.7–1.7)
Alpha 1: 0.3 g/dL (ref 0.0–0.4)
B-Globulin SerPl Elph-Mcnc: 1.1 g/dL (ref 0.7–1.3)
Gamma Glob SerPl Elph-Mcnc: 1.4 g/dL (ref 0.4–1.8)
Globulin, Total: 3.7 g/dL (ref 2.2–3.9)
IGG (IMMUNOGLOBIN G), SERUM: 1155 mg/dL (ref 700–1600)
IGM (IMMUNOGLOBULIN M), SRM: 39 mg/dL (ref 20–172)
IgA: 168 mg/dL (ref 90–386)
M Protein SerPl Elph-Mcnc: 0.5 g/dL — ABNORMAL HIGH
TOTAL PROTEIN ELP: 7.1 g/dL (ref 6.0–8.5)

## 2016-11-15 ENCOUNTER — Ambulatory Visit: Payer: Medicaid Other | Admitting: Urology

## 2016-11-15 ENCOUNTER — Other Ambulatory Visit: Payer: Self-pay | Admitting: *Deleted

## 2016-11-15 DIAGNOSIS — C9000 Multiple myeloma not having achieved remission: Secondary | ICD-10-CM

## 2016-11-15 MED ORDER — HYDROMORPHONE HCL 2 MG PO TABS: 2.0000 mg | ORAL_TABLET | Freq: Four times a day (QID) | ORAL | 0 refills | Status: DC | PRN

## 2016-11-16 NOTE — Progress Notes (Signed)
11/20/2016 3:17 PM   [name] 1960/05/09 [id number]  Referring provider: Petra Kuba, MD [place], [place] [address]  No chief complaint on file.   HPI: Patient is a 56 year old Caucasian male with ED who presents to discuss his options for treatment.    His SHIM score was 5, which is severe ED.   His previous SHIM score was 7.  He has been having difficulty with erections for a little over one year.   His major complaint is "they just don't happen.".  His libido is diminished.   His risk factors for ED are age, pelvic radiation, BPH, testosterone deficiency, CAD, stress, anxiety, depression, smoking and pain medication.  He denies any painful erections or curvatures with his erections.   He is still having spontaneous erections, but they are not firm enough for penetration.  His cardiologist has not cleared him for PDE5i.  His testosterone level was normal at 386 in 09/2016.       SHIM    Row Name 10/04/16 1341 11/20/16 1455       SHIM: Over the last 6 months:   How do you rate your confidence that you could get and keep an erection? Very Low Very Low    When you had erections with sexual stimulation, how often were your erections hard enough for penetration (entering your partner)? A Few Times (much less than half the time) Almost Never or Never    During sexual intercourse, how often were you able to maintain your erection after you had penetrated (entered) your partner? A Few Times (much less than half the time) Almost Never or Never    During sexual intercourse, how difficult was it to maintain your erection to completion of intercourse? Extremely Difficult Extremely Difficult    When you attempted sexual intercourse, how often was it satisfactory for you? Almost Never or Never Almost Never or Never      SHIM Total Score   SHIM 7 5       Score: 1-7 Severe ED 8-11 Moderate ED 12-16 Mild-Moderate ED 17-21 Mild ED 22-25 No ED   PMH: Past  Medical History:  Diagnosis Date  . Anxiety   . Arthritis   . Atrial septal defect   . CHF (congestive heart failure) ([place])   . COPD (chronic obstructive pulmonary disease) ([place])   . Coronary artery disease   . Dysrhythmias   . GERD (gastroesophageal reflux disease)   . Hyperlipidemia   . Multiple myeloma ([place])   . Multiple myeloma ([place])   . Myocardial infarction ([place])   . Substance abuse [facility])     Surgical History: Past Surgical History:  Procedure Laterality Date  . CARDIAC DEFIBRILLATOR PLACEMENT      Home Medications:  Allergies as of 11/20/2016   No Known Allergies     Medication List       Accurate as of 11/20/16  3:17 PM. Always use your most recent med list.          acetaminophen 325 MG tablet Commonly known as:  TYLENOL Take 2 tablets (650 mg total) by mouth every 6 (six) hours as needed for mild pain (or Fever >/= 101).   albuterol 108 (90 Base) MCG/ACT inhaler Commonly known as:  PROVENTIL HFA;VENTOLIN HFA Inhale 2 puffs into the lungs every 6 (six) hours as needed for wheezing or shortness of breath.   ALPRAZolam 0.5 MG tablet Commonly known as:  XANAX Take 1 tablet (  0.5 mg total) by mouth 3 (three) times daily as needed for anxiety.   aspirin 325 MG EC tablet Take 325 mg by mouth daily.   atorvastatin 20 MG tablet Commonly known as:  LIPITOR Take 20 mg by mouth daily.   DULoxetine 30 MG capsule Commonly known as:  CYMBALTA TAKE ONE CAPSULE BY MOUTH EVERY DAY   feeding supplement (ENSURE ENLIVE) Liqd Take 237 mLs by mouth 3 (three) times daily between meals.   fentaNYL 100 MCG/HR Commonly known as:  DURAGESIC - dosed mcg/hr Place 1 patch (100 mcg total) onto the skin every other day. Use with a 25 mcg patch for total dose of 125 mcg   fentaNYL 25 MCG/HR patch Commonly known as:  DURAGESIC - dosed mcg/hr Place 1 patch (25 mcg total) onto the skin every other day. Use with a 100 mcg patch for total dose of 125 mcg   furosemide 20 MG  tablet Commonly known as:  LASIX Take 20 mg by mouth daily.   gabapentin 100 MG capsule Commonly known as:  NEURONTIN TAKE TWO CAPSULES BY MOUTH THREE TIMES DAILY   guaiFENesin-codeine 100-10 MG/5ML syrup Commonly known as:  ROBITUSSIN AC Take 5 mLs by mouth 3 (three) times daily as needed for cough.   HYDROmorphone 2 MG tablet Commonly known as:  DILAUDID Take 1 tablet (2 mg total) by mouth every 6 (six) hours as needed for severe pain.   ipratropium-albuterol 0.5-2.5 (3) MG/3ML Soln Commonly known as:  DUONEB Take 3 mLs by nebulization every 4 (four) hours as needed.   lenalidomide 25 MG capsule Commonly known as:  REVLIMID Take 1 capsule (25 mg total) by mouth as directed. 1 capsule daily for 21 days and 1 week off   metoprolol tartrate 50 MG tablet Commonly known as:  LOPRESSOR Take 50 mg by mouth 2 (two) times daily.   ondansetron 4 MG tablet Commonly known as:  ZOFRAN Take 1 tablet (4 mg total) by mouth every 8 (eight) hours as needed for nausea or vomiting.   OXYGEN Inhale 3 L into the lungs daily.   polyethylene glycol packet Commonly known as:  MIRALAX / GLYCOLAX Take 17 g by mouth daily.   tiotropium 18 MCG inhalation capsule Commonly known as:  SPIRIVA Place 1 capsule (18 mcg total) into inhaler and inhale daily.   varenicline 0.5 MG X 11 & 1 MG X 42 tablet Commonly known as:  CHANTIX STARTING MONTH PAK Take one 0.5 mg tablet by mouth once daily for 3 days, then increase to one 0.5 mg tablet twice daily for 4 days, then increase to one 1 mg tablet twice daily.   varenicline 1 MG tablet Commonly known as:  CHANTIX CONTINUING MONTH PAK Take 1 tablet (1 mg total) by mouth 2 (two) times daily.       Allergies: No Known Allergies  Family History: Family History  Problem Relation Age of Onset  . COPD Mother   . CAD Mother   . Heart attack Father   . Prostate cancer Maternal Grandfather   . Kidney cancer Neg Hx   . Bladder Cancer Neg Hx      Social History:  reports that he has been smoking Cigarettes.  He has a 10.00 pack-year smoking history. He has never used smokeless tobacco. He reports that he does not drink alcohol or use drugs.  ROS: UROLOGY Frequent Urination?: No Hard to postpone urination?: No Burning/pain with urination?: No Get up at night to urinate?: No Leakage of  urine?: No Urine stream starts and stops?: No Trouble starting stream?: No Do you have to strain to urinate?: No Blood in urine?: No Urinary tract infection?: No Sexually transmitted disease?: No Injury to kidneys or bladder?: No Painful intercourse?: No Weak stream?: No Erection problems?: Yes Penile pain?: No  Gastrointestinal Nausea?: No Vomiting?: No Indigestion/heartburn?: No Diarrhea?: No Constipation?: No  Constitutional Fever: No Night sweats?: No Weight loss?: No Fatigue?: No  Skin Skin rash/lesions?: No Itching?: No  Eyes Blurred vision?: No Double vision?: No  Ears/Nose/Throat Sore throat?: No Sinus problems?: No  Hematologic/Lymphatic Swollen glands?: No Easy bruising?: Yes  Cardiovascular Leg swelling?: No Chest pain?: No  Respiratory Cough?: No Shortness of breath?: Yes  Endocrine Excessive thirst?: No  Musculoskeletal Back pain?: Yes Joint pain?: Yes  Neurological Headaches?: No Dizziness?: No  Psychologic Depression?: No Anxiety?: Yes  Physical Exam: BP 98/63 (BP Location: Right Arm, Patient Position: Sitting, Cuff Size: Normal)   Pulse 99   Ht 5' 11"  (1.803 m)   Wt 132 lb 8 oz (60.1 kg)   BMI 18.48 kg/m   Constitutional: Well nourished. Alert and oriented, No acute distress. HEENT: [place] AT, moist mucus membranes. Trachea midline, no masses. Cardiovascular: No clubbing, cyanosis, or edema. Respiratory: Normal respiratory effort, no increased work of breathing. Skin: No rashes, bruises or suspicious lesions. Lymph: No cervical or inguinal adenopathy. Neurologic: Grossly  intact, no focal deficits, moving all 4 extremities. Psychiatric: Normal mood and affect.  Laboratory Data: Lab Results  Component Value Date   WBC 6.0 11/09/2016   HGB 13.4 11/09/2016   HCT 39.4 (L) 11/09/2016   MCV 97.9 11/09/2016   PLT 293 11/09/2016    Lab Results  Component Value Date   CREATININE 0.94 11/09/2016    Lab Results  Component Value Date   PSA 0.6 09/25/2013   PSA 0.6 09/24/2013    Lab Results  Component Value Date   TESTOSTERONE 386 10/16/2016    Lab Results  Component Value Date   AST 16 11/09/2016   Lab Results  Component Value Date   ALT 9 (L) 11/09/2016   I have reviewed the labs.   Assessment & Plan:    1. Erectile dysfunction  - SHIM score is 5, it is worse  - I explained to the patient that in order to achieve an erection it takes good functioning of the nervous system (parasympathetic and rs, sympathetic, sensory and motor), good blood flow into the erectile tissue of the penis and a desire to have sex  - I explained that conditions like diabetes, hypertension, coronary artery disease, peripheral vascular disease, smoking, alcohol consumption, age, sleep apnea and BPH can diminish the ability to have an erection  - he has not been cleared for PDE5i  -We discussed a vacuum erect aid device and intercavernousal injections - he is mostly interested in the latter - he will do some research into insurance coverage and cost of the medication and will call back to schedule a Trimix titration appointment    No Follow-up on file.  These notes generated with voice recognition software. I apologize for typographical errors.  Zara Council, [facility] [address] [place], [place] [address] [phone]

## 2016-11-20 ENCOUNTER — Ambulatory Visit (INDEPENDENT_AMBULATORY_CARE_PROVIDER_SITE_OTHER): Payer: Medicaid Other | Admitting: Urology

## 2016-11-20 ENCOUNTER — Encounter: Payer: Self-pay | Admitting: Urology

## 2016-11-20 VITALS — BP 98/63 | HR 99 | Ht 71.0 in | Wt 132.5 lb

## 2016-11-20 DIAGNOSIS — N529 Male erectile dysfunction, unspecified: Secondary | ICD-10-CM | POA: Diagnosis not present

## 2016-11-28 ENCOUNTER — Ambulatory Visit: Payer: Medicaid Other | Attending: Internal Medicine

## 2016-11-28 DIAGNOSIS — J449 Chronic obstructive pulmonary disease, unspecified: Secondary | ICD-10-CM

## 2016-11-28 MED ORDER — ALBUTEROL SULFATE (2.5 MG/3ML) 0.083% IN NEBU
2.5000 mg | INHALATION_SOLUTION | Freq: Once | RESPIRATORY_TRACT | Status: AC
  Administered 2016-11-28: 2.5 mg via RESPIRATORY_TRACT
  Filled 2016-11-28: qty 3

## 2016-11-30 ENCOUNTER — Other Ambulatory Visit: Payer: Self-pay | Admitting: Internal Medicine

## 2016-11-30 ENCOUNTER — Other Ambulatory Visit: Payer: Self-pay | Admitting: *Deleted

## 2016-11-30 DIAGNOSIS — C9000 Multiple myeloma not having achieved remission: Secondary | ICD-10-CM

## 2016-11-30 MED ORDER — FENTANYL 100 MCG/HR TD PT72: 100.0000 ug | MEDICATED_PATCH | TRANSDERMAL | 0 refills | Status: DC

## 2016-11-30 MED ORDER — FENTANYL 25 MCG/HR TD PT72: 25.0000 ug | MEDICATED_PATCH | TRANSDERMAL | 0 refills | Status: DC

## 2016-12-06 ENCOUNTER — Other Ambulatory Visit: Payer: Self-pay | Admitting: Internal Medicine

## 2016-12-13 ENCOUNTER — Other Ambulatory Visit: Payer: Self-pay | Admitting: *Deleted

## 2016-12-13 DIAGNOSIS — C9000 Multiple myeloma not having achieved remission: Secondary | ICD-10-CM

## 2016-12-13 MED ORDER — HYDROMORPHONE HCL 2 MG PO TABS: 2.0000 mg | ORAL_TABLET | Freq: Four times a day (QID) | ORAL | 0 refills | Status: DC | PRN

## 2016-12-28 ENCOUNTER — Other Ambulatory Visit: Payer: Self-pay | Admitting: *Deleted

## 2016-12-28 MED ORDER — FENTANYL 100 MCG/HR TD PT72: 100.0000 ug | MEDICATED_PATCH | TRANSDERMAL | 0 refills | Status: DC

## 2016-12-28 MED ORDER — FENTANYL 25 MCG/HR TD PT72: 25.0000 ug | MEDICATED_PATCH | TRANSDERMAL | 0 refills | Status: DC

## 2017-01-04 ENCOUNTER — Inpatient Hospital Stay: Payer: Medicaid Other

## 2017-01-04 ENCOUNTER — Telehealth: Payer: Self-pay | Admitting: *Deleted

## 2017-01-04 DIAGNOSIS — C9000 Multiple myeloma not having achieved remission: Secondary | ICD-10-CM

## 2017-01-04 DIAGNOSIS — F419 Anxiety disorder, unspecified: Secondary | ICD-10-CM

## 2017-01-04 MED ORDER — ALPRAZOLAM 0.5 MG PO TABS: 0.5000 mg | ORAL_TABLET | Freq: Three times a day (TID) | ORAL | 1 refills | Status: DC | PRN

## 2017-01-04 NOTE — Telephone Encounter (Signed)
Patient called requesting medication refill XANAX. Refilled.

## 2017-01-05 ENCOUNTER — Telehealth: Payer: Self-pay | Admitting: *Deleted

## 2017-01-05 ENCOUNTER — Inpatient Hospital Stay: Payer: Medicaid Other | Attending: Internal Medicine

## 2017-01-05 DIAGNOSIS — I252 Old myocardial infarction: Secondary | ICD-10-CM | POA: Insufficient documentation

## 2017-01-05 DIAGNOSIS — G8929 Other chronic pain: Secondary | ICD-10-CM | POA: Diagnosis not present

## 2017-01-05 DIAGNOSIS — Z95810 Presence of automatic (implantable) cardiac defibrillator: Secondary | ICD-10-CM | POA: Insufficient documentation

## 2017-01-05 DIAGNOSIS — M1990 Unspecified osteoarthritis, unspecified site: Secondary | ICD-10-CM | POA: Insufficient documentation

## 2017-01-05 DIAGNOSIS — Z8739 Personal history of other diseases of the musculoskeletal system and connective tissue: Secondary | ICD-10-CM | POA: Diagnosis not present

## 2017-01-05 DIAGNOSIS — E876 Hypokalemia: Secondary | ICD-10-CM | POA: Diagnosis not present

## 2017-01-05 DIAGNOSIS — C9002 Multiple myeloma in relapse: Secondary | ICD-10-CM | POA: Insufficient documentation

## 2017-01-05 DIAGNOSIS — I2510 Atherosclerotic heart disease of native coronary artery without angina pectoris: Secondary | ICD-10-CM | POA: Diagnosis not present

## 2017-01-05 DIAGNOSIS — F17210 Nicotine dependence, cigarettes, uncomplicated: Secondary | ICD-10-CM | POA: Diagnosis not present

## 2017-01-05 DIAGNOSIS — R64 Cachexia: Secondary | ICD-10-CM | POA: Diagnosis not present

## 2017-01-05 DIAGNOSIS — J449 Chronic obstructive pulmonary disease, unspecified: Secondary | ICD-10-CM | POA: Diagnosis not present

## 2017-01-05 DIAGNOSIS — Z79899 Other long term (current) drug therapy: Secondary | ICD-10-CM | POA: Insufficient documentation

## 2017-01-05 DIAGNOSIS — F419 Anxiety disorder, unspecified: Secondary | ICD-10-CM | POA: Insufficient documentation

## 2017-01-05 DIAGNOSIS — Z7982 Long term (current) use of aspirin: Secondary | ICD-10-CM | POA: Insufficient documentation

## 2017-01-05 DIAGNOSIS — E785 Hyperlipidemia, unspecified: Secondary | ICD-10-CM | POA: Insufficient documentation

## 2017-01-05 DIAGNOSIS — I509 Heart failure, unspecified: Secondary | ICD-10-CM | POA: Diagnosis not present

## 2017-01-05 DIAGNOSIS — K219 Gastro-esophageal reflux disease without esophagitis: Secondary | ICD-10-CM | POA: Diagnosis not present

## 2017-01-05 DIAGNOSIS — R112 Nausea with vomiting, unspecified: Secondary | ICD-10-CM | POA: Insufficient documentation

## 2017-01-05 LAB — CBC WITH DIFFERENTIAL/PLATELET
BASOS ABS: 0.1 10*3/uL (ref 0–0.1)
BASOS PCT: 1 %
Eosinophils Absolute: 0.3 10*3/uL (ref 0–0.7)
Eosinophils Relative: 3 %
HEMATOCRIT: 39.6 % — AB (ref 40.0–52.0)
Hemoglobin: 12.9 g/dL — ABNORMAL LOW (ref 13.0–18.0)
LYMPHS PCT: 19 %
Lymphs Abs: 1.9 10*3/uL (ref 1.0–3.6)
MCH: 30.6 pg (ref 26.0–34.0)
MCHC: 32.5 g/dL (ref 32.0–36.0)
MCV: 94.1 fL (ref 80.0–100.0)
Monocytes Absolute: 0.9 10*3/uL (ref 0.2–1.0)
Monocytes Relative: 8 %
NEUTROS ABS: 7.1 10*3/uL — AB (ref 1.4–6.5)
NEUTROS PCT: 69 %
Platelets: 410 10*3/uL (ref 150–440)
RBC: 4.2 MIL/uL — AB (ref 4.40–5.90)
RDW: 14 % (ref 11.5–14.5)
WBC: 10.3 10*3/uL (ref 3.8–10.6)

## 2017-01-05 LAB — COMPREHENSIVE METABOLIC PANEL
ALBUMIN: 3.5 g/dL (ref 3.5–5.0)
ALT: 17 U/L (ref 17–63)
ANION GAP: 7 (ref 5–15)
AST: 22 U/L (ref 15–41)
Alkaline Phosphatase: 45 U/L (ref 38–126)
BILIRUBIN TOTAL: 0.4 mg/dL (ref 0.3–1.2)
BUN: 13 mg/dL (ref 6–20)
CALCIUM: 9.2 mg/dL (ref 8.9–10.3)
CO2: 33 mmol/L — ABNORMAL HIGH (ref 22–32)
Chloride: 97 mmol/L — ABNORMAL LOW (ref 101–111)
Creatinine, Ser: 0.88 mg/dL (ref 0.61–1.24)
Glucose, Bld: 75 mg/dL (ref 65–99)
POTASSIUM: 3.8 mmol/L (ref 3.5–5.1)
Sodium: 137 mmol/L (ref 135–145)
TOTAL PROTEIN: 7.1 g/dL (ref 6.5–8.1)

## 2017-01-05 NOTE — Telephone Encounter (Signed)
Alleltita from [employer] called to verify patient's status regarding treatment. [place]

## 2017-01-05 NOTE — Telephone Encounter (Signed)
revlimid is currently on hold. I contacted [facility]- and left a vm for [facility] x [place].

## 2017-01-08 LAB — KAPPA/LAMBDA LIGHT CHAINS
KAPPA FREE LGHT CHN: 27.6 mg/L — AB (ref 3.3–19.4)
KAPPA, LAMDA LIGHT CHAIN RATIO: 1.83 — AB (ref 0.26–1.65)
LAMDA FREE LIGHT CHAINS: 15.1 mg/L (ref 5.7–26.3)

## 2017-01-09 ENCOUNTER — Telehealth: Payer: Self-pay | Admitting: *Deleted

## 2017-01-09 DIAGNOSIS — C9000 Multiple myeloma not having achieved remission: Secondary | ICD-10-CM

## 2017-01-09 LAB — MULTIPLE MYELOMA PANEL, SERUM
ALBUMIN SERPL ELPH-MCNC: 3.3 g/dL (ref 2.9–4.4)
ALPHA 1: 0.2 g/dL (ref 0.0–0.4)
Albumin/Glob SerPl: 1.1 (ref 0.7–1.7)
Alpha2 Glob SerPl Elph-Mcnc: 0.9 g/dL (ref 0.4–1.0)
B-GLOBULIN SERPL ELPH-MCNC: 0.8 g/dL (ref 0.7–1.3)
GAMMA GLOB SERPL ELPH-MCNC: 1.1 g/dL (ref 0.4–1.8)
GLOBULIN, TOTAL: 3.1 g/dL (ref 2.2–3.9)
IgA: 148 mg/dL (ref 90–386)
IgG (Immunoglobin G), Serum: 1272 mg/dL (ref 700–1600)
IgM (Immunoglobulin M), Srm: 62 mg/dL (ref 20–172)
M PROTEIN SERPL ELPH-MCNC: 0.6 g/dL — AB
Total Protein ELP: 6.4 g/dL (ref 6.0–8.5)

## 2017-01-09 MED ORDER — HYDROMORPHONE HCL 2 MG PO TABS: 2.0000 mg | ORAL_TABLET | Freq: Four times a day (QID) | ORAL | 0 refills | Status: DC | PRN

## 2017-01-09 NOTE — Telephone Encounter (Signed)
Patient came into office this morning requesting refill of his Dilaudid reporting that "last week was a rough one and I took more than I was supposed to" Discussed with Alease Medina, NP Patient given a 3 day supply as he sees physician in 2 days

## 2017-01-11 ENCOUNTER — Other Ambulatory Visit: Payer: Medicaid Other

## 2017-01-11 ENCOUNTER — Inpatient Hospital Stay (HOSPITAL_BASED_OUTPATIENT_CLINIC_OR_DEPARTMENT_OTHER): Payer: Medicaid Other | Admitting: Internal Medicine

## 2017-01-11 VITALS — BP 99/67 | HR 93 | Temp 98.4°F | Resp 16 | Wt 146.0 lb

## 2017-01-11 DIAGNOSIS — Z95810 Presence of automatic (implantable) cardiac defibrillator: Secondary | ICD-10-CM

## 2017-01-11 DIAGNOSIS — E876 Hypokalemia: Secondary | ICD-10-CM | POA: Diagnosis not present

## 2017-01-11 DIAGNOSIS — R634 Abnormal weight loss: Secondary | ICD-10-CM

## 2017-01-11 DIAGNOSIS — I509 Heart failure, unspecified: Secondary | ICD-10-CM

## 2017-01-11 DIAGNOSIS — R112 Nausea with vomiting, unspecified: Secondary | ICD-10-CM | POA: Diagnosis not present

## 2017-01-11 DIAGNOSIS — K219 Gastro-esophageal reflux disease without esophagitis: Secondary | ICD-10-CM | POA: Diagnosis not present

## 2017-01-11 DIAGNOSIS — F419 Anxiety disorder, unspecified: Secondary | ICD-10-CM

## 2017-01-11 DIAGNOSIS — I2510 Atherosclerotic heart disease of native coronary artery without angina pectoris: Secondary | ICD-10-CM | POA: Diagnosis not present

## 2017-01-11 DIAGNOSIS — Z8739 Personal history of other diseases of the musculoskeletal system and connective tissue: Secondary | ICD-10-CM

## 2017-01-11 DIAGNOSIS — C9002 Multiple myeloma in relapse: Secondary | ICD-10-CM

## 2017-01-11 DIAGNOSIS — I252 Old myocardial infarction: Secondary | ICD-10-CM | POA: Diagnosis not present

## 2017-01-11 DIAGNOSIS — Z79899 Other long term (current) drug therapy: Secondary | ICD-10-CM

## 2017-01-11 DIAGNOSIS — E785 Hyperlipidemia, unspecified: Secondary | ICD-10-CM

## 2017-01-11 DIAGNOSIS — Z7982 Long term (current) use of aspirin: Secondary | ICD-10-CM

## 2017-01-11 DIAGNOSIS — G8929 Other chronic pain: Secondary | ICD-10-CM | POA: Diagnosis not present

## 2017-01-11 DIAGNOSIS — J449 Chronic obstructive pulmonary disease, unspecified: Secondary | ICD-10-CM

## 2017-01-11 DIAGNOSIS — C9000 Multiple myeloma not having achieved remission: Secondary | ICD-10-CM

## 2017-01-11 DIAGNOSIS — F17210 Nicotine dependence, cigarettes, uncomplicated: Secondary | ICD-10-CM

## 2017-01-11 DIAGNOSIS — M1990 Unspecified osteoarthritis, unspecified site: Secondary | ICD-10-CM

## 2017-01-11 MED ORDER — ONDANSETRON HCL 8 MG PO TABS
ORAL_TABLET | ORAL | 0 refills | Status: DC
Start: 2017-01-11 — End: 2017-10-31

## 2017-01-11 MED ORDER — HYDROMORPHONE HCL 2 MG PO TABS: 2.0000 mg | ORAL_TABLET | Freq: Four times a day (QID) | ORAL | 0 refills | Status: DC | PRN

## 2017-01-11 NOTE — Progress Notes (Signed)
[place] OFFICE PROGRESS NOTE  Patient Care Team: Petra Kuba, MD as PCP - General (Family Medicine)   SUMMARY OF ONCOLOGIC HISTORY:  Oncology History   # SEP 2015- MULTIPLE MYELOMA [multiple PET pos Bone lesions; hypercalcemia s/p BMBx; FISH- Aneuploidy - gain of chromosome 7,9,15,FGFR3/4p16.3, and CCND1/11q13. Loss of MAF/16q23.1), cytogenetics normal 46XY] s/p Vel-Dex-Rev-Zometa; Excellent PR;   # MARCH 2016- REV-DEX ; FEB 2017-[discn Dex] Rev 25 mg 3 w On & 1 w Off; March 2017- M- protein 0.4gm/dl; K/L= 7.9; cont Rev;   # Revlimid on June 2018 [sec to ONJ]  # March 2017-  Left Middle Lobe cavitary lesion- ~49m- repeat Ct in 3-428m# AUG 2017- ONJ [[name], [place] #  ? ONJ- [id number] not follow up ]- DISCONT Zometa.   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  Patient complains of increasing pain in his ribs; back-as he has been more active at home.  Continues to complain of chronic pain-for which she has been taking fentanyl and Dilaudid.  He is also on Ativan for anxiety.  Continues to have episodes of nausea with vomiting-which she attributes to anxiety.  He continues to complain of chronic cough chronic shortness of breath. Not any worse. Unfortunately continues to smoke. States is cutting down.  REVIEW OF SYSTEMS:  A complete 10 point review of system is done which is negative except mentioned above/history of present illness.   PAST MEDICAL HISTORY :  Past Medical History:  Diagnosis Date  . Anxiety   . Arthritis   . Atrial septal defect   . CHF (congestive heart failure) ([facility]  . COPD (chronic  obstructive pulmonary disease) (HCR[place]  . Coronary artery disease   . Dysrhythmias   . GERD (gastroesophageal reflux disease)   . Hyperlipidemia   . Multiple myeloma ([facility]  . Multiple myeloma (H[facility]  . Myocardial infarction (HC[facility]  . Substance abuse (HC[facility] [place]    PAST SURGICAL HISTORY :   Past Surgical History:  Procedure Laterality Date  . CARDIAC DEFIBRILLATOR PLACEMENT      FAMILY HISTORY :   Family History  Problem Relation Age of Onset  . COPD Mother   . CAD Mother   . Heart attack Father   . Prostate cancer Maternal Grandfather   . Kidney cancer Neg Hx   . Bladder Cancer Neg Hx     SOCIAL HISTORY:   Social History   Tobacco Use  . Smoking status: Current Every Day Smoker    Packs/day: 0.25    Years: 40.00    Pack years: 10.00    Types: Cigarettes  . Smokeless tobacco: Never Used  Substance Use Topics  . Alcohol use: No    Alcohol/week: 0.0 oz  . Drug use: No    ALLERGIES:  has No Known Allergies.  MEDICATIONS:  Current Outpatient Medications  Medication Sig Dispense Refill  . acetaminophen (TYLENOL) 325 MG tablet Take 2 tablets (650 mg total) by mouth every 6 (six) hours as needed for mild pain (or Fever >/= 101).    . Marland Kitchenlbuterol (PROVENTIL HFA;VENTOLIN HFA) 108 (90 Base) MCG/ACT inhaler Inhale 2 puffs into the lungs  every 6 (six) hours as needed for wheezing or shortness of breath. 1 Inhaler 1  . ALPRAZolam (XANAX) 0.5 MG tablet Take 1 tablet (0.5 mg total) by mouth 3 (three) times daily as needed for anxiety. 90 tablet 1  . aspirin 325 MG EC tablet Take 325 mg by mouth daily.    Marland Kitchen atorvastatin (LIPITOR) 20 MG tablet Take 20 mg by mouth daily.  2  . CHANTIX 1 MG tablet TAKE ONE TABLET BY MOUTH TWICE DAILY 56 tablet 0  . DULoxetine (CYMBALTA) 30 MG capsule TAKE ONE CAPSULE BY MOUTH EVERY DAY 90 capsule 1  . feeding supplement, ENSURE ENLIVE, (ENSURE ENLIVE) LIQD Take 237 mLs by mouth 3 (three) times daily between meals. 90 Bottle 1  . fentaNYL (DURAGESIC -  DOSED MCG/HR) 100 MCG/HR Place 1 patch (100 mcg total) onto the skin every other day. Use with a 25 mcg patch for total dose of 125 mcg 15 patch 0  . fentaNYL (DURAGESIC - DOSED MCG/HR) 25 MCG/HR patch Place 1 patch (25 mcg total) onto the skin every other day. Use with a 100 mcg patch for total dose of 125 mcg 15 patch 0  . furosemide (LASIX) 20 MG tablet Take 20 mg by mouth daily.  0  . gabapentin (NEURONTIN) 100 MG capsule TAKE TWO CAPSULES BY MOUTH THREE TIMES DAILY 180 capsule 3  . guaiFENesin-codeine (ROBITUSSIN AC) 100-10 MG/5ML syrup Take 5 mLs by mouth 3 (three) times daily as needed for cough. 120 mL 0  . HYDROmorphone (DILAUDID) 2 MG tablet Take 1 tablet (2 mg total) by mouth every 6 (six) hours as needed for severe pain. 120 tablet 0  . ipratropium-albuterol (DUONEB) 0.5-2.5 (3) MG/3ML SOLN Take 3 mLs by nebulization every 4 (four) hours as needed. 360 mL 3  . lenalidomide (REVLIMID) 25 MG capsule Take 1 capsule (25 mg total) by mouth as directed. 1 capsule daily for 21 days and 1 week off 21 capsule 0  . metoprolol tartrate (LOPRESSOR) 50 MG tablet Take 50 mg by mouth 2 (two) times daily.  2  . ondansetron (ZOFRAN) 4 MG tablet Take 1 tablet (4 mg total) by mouth every 8 (eight) hours as needed for nausea or vomiting. 21 tablet 3  . OXYGEN Inhale 3 L into the lungs daily.    . polyethylene glycol (MIRALAX / GLYCOLAX) packet Take 17 g by mouth daily. 30 each 0  . tiotropium (SPIRIVA) 18 MCG inhalation capsule Place 1 capsule (18 mcg total) into inhaler and inhale daily. 30 capsule 12  . varenicline (CHANTIX STARTING MONTH PAK) 0.5 MG X 11 & 1 MG X 42 tablet Take one 0.5 mg tablet by mouth once daily for 3 days, then increase to one 0.5 mg tablet twice daily for 4 days, then increase to one 1 mg tablet twice daily. 53 tablet 0  . ondansetron (ZOFRAN) 8 MG tablet 1 pill every 8 hours as needed. 40 tablet 0   No current facility-administered medications for this visit.     Facility-Administered Medications Ordered in Other Visits  Medication Dose Route Frequency Provider Last Rate Last Dose  . 0.9 %  sodium chloride infusion   Intravenous Continuous Cammie Sickle, MD 10 mL/hr at 02/24/15 1520      PHYSICAL EXAMINATION: ECOG PERFORMANCE STATUS: 2 - Symptomatic, <50% confined to bed  BP 99/67 (BP Location: Right Arm, Patient Position: Sitting)   Pulse 93   Temp 98.4 F (36.9 C) (Tympanic)   Resp 16  Wt 146 lb (66.2 kg)   BMI 20.36 kg/m   Filed Weights   01/11/17 1125  Weight: 146 lb (66.2 kg)    GENERAL: Thin build cachectic-appearing Caucasian male patient. Alert, in  Mild- nod pain; .  Accompanied by friend/care giver.  he is walking by himself EYES: no pallor or icterus OROPHARYNX: Jaw bone exposed left lower jaw poor dentition. NECK: supple, no masses felt LYMPH:  no palpable lymphadenopathy in the cervical, axillary or inguinal regions LUNGS: With decreased bilateral breath sounds. No wheeze or crackles HEART/CVS: regular rate & rhythm and no murmurs;  No lower extremity edema. ABDOMEN:abdomen soft, non-tender and normal bowel sounds Musculoskeletal:no cyanosis of digits and no clubbing  PSYCH: alert & oriented x 3 with fluent speech NEURO: no focal motor/sensory deficits SKIN:  no rashes or significant lesions  LABORATORY DATA:  I have reviewed the data as listed    Component Value Date/Time   NA 137 01/05/2017 1526   NA 135 05/12/2014 1343   K 3.8 01/05/2017 1526   K 3.3 (L) 05/12/2014 1343   CL 97 (L) 01/05/2017 1526   CL 101 05/12/2014 1343   CO2 33 (H) 01/05/2017 1526   CO2 27 05/12/2014 1343   GLUCOSE 75 01/05/2017 1526   GLUCOSE 77 05/12/2014 1343   BUN 13 01/05/2017 1526   BUN 9 05/12/2014 1343   CREATININE 0.88 01/05/2017 1526   CREATININE 0.89 05/12/2014 1343   CALCIUM 9.2 01/05/2017 1526   CALCIUM 8.8 (L) 05/12/2014 1343   PROT 7.1 01/05/2017 1526   PROT 5.9 (L) 02/17/2014 1405   ALBUMIN 3.5 01/05/2017  1526   ALBUMIN 2.8 (L) 02/17/2014 1405   AST 22 01/05/2017 1526   AST 9 (L) 02/17/2014 1405   ALT 17 01/05/2017 1526   ALT 12 (L) 02/17/2014 1405   ALKPHOS 45 01/05/2017 1526   ALKPHOS 44 (L) 02/17/2014 1405   BILITOT 0.4 01/05/2017 1526   BILITOT 0.2 02/17/2014 1405   GFRNONAA >60 01/05/2017 1526   GFRNONAA >60 05/12/2014 1343   GFRAA >60 01/05/2017 1526   GFRAA >60 05/12/2014 1343    No results found for: SPEP, UPEP  Lab Results  Component Value Date   WBC 10.3 01/05/2017   NEUTROABS 7.1 (H) 01/05/2017   HGB 12.9 (L) 01/05/2017   HCT 39.6 (L) 01/05/2017   MCV 94.1 01/05/2017   PLT 410 01/05/2017      Chemistry      Component Value Date/Time   NA 137 01/05/2017 1526   NA 135 05/12/2014 1343   K 3.8 01/05/2017 1526   K 3.3 (L) 05/12/2014 1343   CL 97 (L) 01/05/2017 1526   CL 101 05/12/2014 1343   CO2 33 (H) 01/05/2017 1526   CO2 27 05/12/2014 1343   BUN 13 01/05/2017 1526   BUN 9 05/12/2014 1343   CREATININE 0.88 01/05/2017 1526   CREATININE 0.89 05/12/2014 1343      Component Value Date/Time   CALCIUM 9.2 01/05/2017 1526   CALCIUM 8.8 (L) 05/12/2014 1343   ALKPHOS 45 01/05/2017 1526   ALKPHOS 44 (L) 02/17/2014 1405   AST 22 01/05/2017 1526   AST 9 (L) 02/17/2014 1405   ALT 17 01/05/2017 1526   ALT 12 (L) 02/17/2014 1405   BILITOT 0.4 01/05/2017 1526   BILITOT 0.2 02/17/2014 1405      Results for JURON, VORHEES A (MRN [id number]) as of 11/29/2015 16:11  Ref. Range 07/13/2015 12:38 07/14/2015 14:00 09/02/2015 13:15 09/09/2015 13:33 11/08/2015 13:50  M Protein SerPl Elph-Mcnc Latest Ref Range: Not Observed g/dL    0.3 (H) 0.3 (H)  Results for DIETER, HANE (MRN [id number]) as of 11/29/2015 16:11  Ref. Range 01/06/2015 09:43 04/05/2015 09:09 06/02/2015 13:20 09/09/2015 13:33 11/08/2015 13:50  Kappa, lamda light chain ratio Latest Ref Range: 0.26 - 1.65  10.07 (H) 7.95 (H) 5.62 (H) 3.04 (H) 2.74 (H)       ASSESSMENT & PLAN:   Multiple myeloma in relapse ([place]) #  MULTIPLE MYELOMA-with biochemical recurrence. HOLDING MAINTENANCE REVLIMID- sec ONJ/co-morbidities.   # No concerns for clinical progression noted this time; but biochemical recurrence [# DEC  2018- M protein- 0.6 ; K/L- slightly abnormal.  CBC CMP-unremarkable.] However recommend holding Revlimid [given wound healing issues/left jaw osteonecrosis of the jaw-C discussion below].   #  Osteonecrosis of the jaw- Denosumab discontinued. Hold Revlimid- for possible wound healing issues..   # chronic pain/anxiety- on fentanyl [137.25mg] and Dilaudid; Xanax. New scripts given- recommend not going up on pain medications.   # Nausea/ vomitting- ? Anxiety- recommend zofran/pt already on ativan-   #Hypokalemia continue potassium supplementation.  # follow up in 2 months/ cbc/cmp/m-panel labs.  Cc; Dr.khan; [facility]      GCammie Sickle MD 01/11/2017 6:18 PM

## 2017-01-11 NOTE — Assessment & Plan Note (Addendum)
#  MULTIPLE MYELOMA-with biochemical recurrence. HOLDING MAINTENANCE REVLIMID- sec ONJ/co-morbidities.   # No concerns for clinical progression noted this time; but biochemical recurrence [# DEC  2018- M protein- 0.6 ; K/L- slightly abnormal.  CBC CMP-unremarkable.] However recommend holding Revlimid [given wound healing issues/left jaw osteonecrosis of the jaw-C discussion below].   #  Osteonecrosis of the jaw- Denosumab discontinued. Hold Revlimid- for possible wound healing issues..   # chronic pain/anxiety- on fentanyl [137.5mcg] and Dilaudid; Xanax. New scripts given- recommend not going up on pain medications.   # Nausea/ vomitting- ? Anxiety- recommend zofran/pt already on ativan-   #Hypokalemia continue potassium supplementation.  # follow up in 2 months/ cbc/cmp/m-panel labs.  Cc; Dr.khan; [facility]  

## 2017-01-26 ENCOUNTER — Other Ambulatory Visit: Payer: Self-pay | Admitting: *Deleted

## 2017-01-26 MED ORDER — FENTANYL 25 MCG/HR TD PT72: 25.0000 ug | MEDICATED_PATCH | TRANSDERMAL | 0 refills | Status: DC

## 2017-01-26 MED ORDER — FENTANYL 100 MCG/HR TD PT72: 100.0000 ug | MEDICATED_PATCH | TRANSDERMAL | 0 refills | Status: DC

## 2017-01-26 NOTE — Addendum Note (Signed)
Addended by: Betti Cruz on: 01/26/2017 03:30 PM   Modules accepted: Orders

## 2017-01-26 NOTE — Telephone Encounter (Addendum)
Fentanyl sent to wrong pharmacy. Please resend to [facility]

## 2017-02-09 ENCOUNTER — Other Ambulatory Visit: Payer: Self-pay | Admitting: *Deleted

## 2017-02-09 DIAGNOSIS — C9000 Multiple myeloma not having achieved remission: Secondary | ICD-10-CM

## 2017-02-09 MED ORDER — HYDROMORPHONE HCL 2 MG PO TABS: 2.0000 mg | ORAL_TABLET | Freq: Four times a day (QID) | ORAL | 0 refills | Status: DC | PRN

## 2017-02-09 NOTE — Telephone Encounter (Signed)
Patient was given 3 days worth of medication on 01/09/17. Was given 30 days worth of medication on 01/11/17. Will refill today with earliest fill date of 02/12/16.

## 2017-02-27 ENCOUNTER — Other Ambulatory Visit: Payer: Self-pay | Admitting: *Deleted

## 2017-02-27 MED ORDER — FENTANYL 100 MCG/HR TD PT72: 100.0000 ug | MEDICATED_PATCH | TRANSDERMAL | 0 refills | Status: DC

## 2017-02-27 MED ORDER — FENTANYL 25 MCG/HR TD PT72: 25.0000 ug | MEDICATED_PATCH | TRANSDERMAL | 0 refills | Status: DC

## 2017-02-27 NOTE — Addendum Note (Signed)
Addended by: Betti Cruz on: 02/27/2017 01:47 PM   Modules accepted: Orders

## 2017-03-05 ENCOUNTER — Other Ambulatory Visit: Payer: Self-pay | Admitting: *Deleted

## 2017-03-05 DIAGNOSIS — F419 Anxiety disorder, unspecified: Secondary | ICD-10-CM

## 2017-03-05 DIAGNOSIS — C9000 Multiple myeloma not having achieved remission: Secondary | ICD-10-CM

## 2017-03-05 MED ORDER — ALPRAZOLAM 0.5 MG PO TABS: 0.5000 mg | ORAL_TABLET | Freq: Three times a day (TID) | ORAL | 1 refills | Status: DC | PRN

## 2017-03-12 ENCOUNTER — Other Ambulatory Visit: Payer: Self-pay | Admitting: *Deleted

## 2017-03-12 DIAGNOSIS — C9000 Multiple myeloma not having achieved remission: Secondary | ICD-10-CM

## 2017-03-12 MED ORDER — HYDROMORPHONE HCL 2 MG PO TABS: 2.0000 mg | ORAL_TABLET | Freq: Four times a day (QID) | ORAL | 0 refills | Status: DC | PRN

## 2017-03-12 NOTE — Telephone Encounter (Signed)
Patient called [place] requesting refill of Dilaudid 2mg  tablets.   [place] Controlled Substance Reporting System reviewed and refill is appropriate on or after 03/13/2017. Electronic prescription securely sent using [employer] app.   NCCSRS reviewed:     Beckey Rutter, DNP, AGNP-C [place] [facility] [phone] (office) 03/12/17 10:59 AM

## 2017-03-14 ENCOUNTER — Inpatient Hospital Stay: Payer: Medicaid Other

## 2017-03-15 ENCOUNTER — Inpatient Hospital Stay: Payer: Medicaid Other | Attending: Internal Medicine

## 2017-03-15 DIAGNOSIS — Z7982 Long term (current) use of aspirin: Secondary | ICD-10-CM | POA: Diagnosis not present

## 2017-03-15 DIAGNOSIS — G8929 Other chronic pain: Secondary | ICD-10-CM | POA: Diagnosis not present

## 2017-03-15 DIAGNOSIS — Z8042 Family history of malignant neoplasm of prostate: Secondary | ICD-10-CM | POA: Diagnosis not present

## 2017-03-15 DIAGNOSIS — F419 Anxiety disorder, unspecified: Secondary | ICD-10-CM | POA: Insufficient documentation

## 2017-03-15 DIAGNOSIS — F17210 Nicotine dependence, cigarettes, uncomplicated: Secondary | ICD-10-CM | POA: Insufficient documentation

## 2017-03-15 DIAGNOSIS — J449 Chronic obstructive pulmonary disease, unspecified: Secondary | ICD-10-CM | POA: Insufficient documentation

## 2017-03-15 DIAGNOSIS — I509 Heart failure, unspecified: Secondary | ICD-10-CM | POA: Insufficient documentation

## 2017-03-15 DIAGNOSIS — E785 Hyperlipidemia, unspecified: Secondary | ICD-10-CM | POA: Diagnosis not present

## 2017-03-15 DIAGNOSIS — Z8739 Personal history of other diseases of the musculoskeletal system and connective tissue: Secondary | ICD-10-CM | POA: Diagnosis not present

## 2017-03-15 DIAGNOSIS — C9002 Multiple myeloma in relapse: Secondary | ICD-10-CM | POA: Insufficient documentation

## 2017-03-15 DIAGNOSIS — I2510 Atherosclerotic heart disease of native coronary artery without angina pectoris: Secondary | ICD-10-CM | POA: Diagnosis not present

## 2017-03-15 DIAGNOSIS — R112 Nausea with vomiting, unspecified: Secondary | ICD-10-CM | POA: Insufficient documentation

## 2017-03-15 DIAGNOSIS — K219 Gastro-esophageal reflux disease without esophagitis: Secondary | ICD-10-CM | POA: Insufficient documentation

## 2017-03-15 DIAGNOSIS — R64 Cachexia: Secondary | ICD-10-CM | POA: Diagnosis not present

## 2017-03-15 DIAGNOSIS — I252 Old myocardial infarction: Secondary | ICD-10-CM | POA: Insufficient documentation

## 2017-03-15 DIAGNOSIS — Z95810 Presence of automatic (implantable) cardiac defibrillator: Secondary | ICD-10-CM | POA: Insufficient documentation

## 2017-03-15 DIAGNOSIS — Z79899 Other long term (current) drug therapy: Secondary | ICD-10-CM | POA: Insufficient documentation

## 2017-03-15 DIAGNOSIS — C9000 Multiple myeloma not having achieved remission: Secondary | ICD-10-CM

## 2017-03-15 LAB — CBC WITH DIFFERENTIAL/PLATELET
BASOS ABS: 0.1 10*3/uL (ref 0–0.1)
Basophils Relative: 1 %
EOS PCT: 4 %
Eosinophils Absolute: 0.2 10*3/uL (ref 0–0.7)
HCT: 38.6 % — ABNORMAL LOW (ref 40.0–52.0)
Hemoglobin: 12.7 g/dL — ABNORMAL LOW (ref 13.0–18.0)
LYMPHS PCT: 25 %
Lymphs Abs: 1.6 10*3/uL (ref 1.0–3.6)
MCH: 29.2 pg (ref 26.0–34.0)
MCHC: 33 g/dL (ref 32.0–36.0)
MCV: 88.4 fL (ref 80.0–100.0)
MONO ABS: 0.6 10*3/uL (ref 0.2–1.0)
Monocytes Relative: 10 %
Neutro Abs: 3.9 10*3/uL (ref 1.4–6.5)
Neutrophils Relative %: 60 %
PLATELETS: 279 10*3/uL (ref 150–440)
RBC: 4.37 MIL/uL — AB (ref 4.40–5.90)
RDW: 14.1 % (ref 11.5–14.5)
WBC: 6.4 10*3/uL (ref 3.8–10.6)

## 2017-03-15 LAB — COMPREHENSIVE METABOLIC PANEL
ALT: 8 U/L — AB (ref 17–63)
AST: 16 U/L (ref 15–41)
Albumin: 4.1 g/dL (ref 3.5–5.0)
Alkaline Phosphatase: 52 U/L (ref 38–126)
Anion gap: 4 — ABNORMAL LOW (ref 5–15)
BUN: 7 mg/dL (ref 6–20)
CHLORIDE: 99 mmol/L — AB (ref 101–111)
CO2: 32 mmol/L (ref 22–32)
CREATININE: 0.83 mg/dL (ref 0.61–1.24)
Calcium: 9.4 mg/dL (ref 8.9–10.3)
Glucose, Bld: 100 mg/dL — ABNORMAL HIGH (ref 65–99)
Potassium: 4.3 mmol/L (ref 3.5–5.1)
Sodium: 135 mmol/L (ref 135–145)
Total Bilirubin: 0.5 mg/dL (ref 0.3–1.2)
Total Protein: 8.1 g/dL (ref 6.5–8.1)

## 2017-03-16 LAB — KAPPA/LAMBDA LIGHT CHAINS
Kappa free light chain: 27.1 mg/L — ABNORMAL HIGH (ref 3.3–19.4)
Kappa, lambda light chain ratio: 2.61 — ABNORMAL HIGH (ref 0.26–1.65)
LAMDA FREE LIGHT CHAINS: 10.4 mg/L (ref 5.7–26.3)

## 2017-03-19 LAB — MULTIPLE MYELOMA PANEL, SERUM
ALBUMIN SERPL ELPH-MCNC: 3.8 g/dL (ref 2.9–4.4)
ALPHA 1: 0.3 g/dL (ref 0.0–0.4)
Albumin/Glob SerPl: 1.1 (ref 0.7–1.7)
Alpha2 Glob SerPl Elph-Mcnc: 1 g/dL (ref 0.4–1.0)
B-GLOBULIN SERPL ELPH-MCNC: 1 g/dL (ref 0.7–1.3)
GAMMA GLOB SERPL ELPH-MCNC: 1.3 g/dL (ref 0.4–1.8)
GLOBULIN, TOTAL: 3.5 g/dL (ref 2.2–3.9)
IGG (IMMUNOGLOBIN G), SERUM: 1433 mg/dL (ref 700–1600)
IgA: 139 mg/dL (ref 90–386)
IgM (Immunoglobulin M), Srm: 45 mg/dL (ref 20–172)
M PROTEIN SERPL ELPH-MCNC: 0.5 g/dL — AB
TOTAL PROTEIN ELP: 7.3 g/dL (ref 6.0–8.5)

## 2017-03-21 ENCOUNTER — Inpatient Hospital Stay (HOSPITAL_BASED_OUTPATIENT_CLINIC_OR_DEPARTMENT_OTHER): Payer: Medicaid Other | Admitting: Internal Medicine

## 2017-03-21 VITALS — BP 92/64 | HR 92 | Resp 16 | Wt 151.6 lb

## 2017-03-21 DIAGNOSIS — I2510 Atherosclerotic heart disease of native coronary artery without angina pectoris: Secondary | ICD-10-CM

## 2017-03-21 DIAGNOSIS — I252 Old myocardial infarction: Secondary | ICD-10-CM | POA: Diagnosis not present

## 2017-03-21 DIAGNOSIS — R64 Cachexia: Secondary | ICD-10-CM | POA: Diagnosis not present

## 2017-03-21 DIAGNOSIS — C9002 Multiple myeloma in relapse: Secondary | ICD-10-CM | POA: Diagnosis not present

## 2017-03-21 DIAGNOSIS — E785 Hyperlipidemia, unspecified: Secondary | ICD-10-CM | POA: Diagnosis not present

## 2017-03-21 DIAGNOSIS — Z7982 Long term (current) use of aspirin: Secondary | ICD-10-CM

## 2017-03-21 DIAGNOSIS — R112 Nausea with vomiting, unspecified: Secondary | ICD-10-CM

## 2017-03-21 DIAGNOSIS — Z79899 Other long term (current) drug therapy: Secondary | ICD-10-CM

## 2017-03-21 DIAGNOSIS — G8929 Other chronic pain: Secondary | ICD-10-CM | POA: Diagnosis not present

## 2017-03-21 DIAGNOSIS — Z8739 Personal history of other diseases of the musculoskeletal system and connective tissue: Secondary | ICD-10-CM

## 2017-03-21 DIAGNOSIS — I509 Heart failure, unspecified: Secondary | ICD-10-CM | POA: Diagnosis not present

## 2017-03-21 DIAGNOSIS — F17210 Nicotine dependence, cigarettes, uncomplicated: Secondary | ICD-10-CM

## 2017-03-21 DIAGNOSIS — K219 Gastro-esophageal reflux disease without esophagitis: Secondary | ICD-10-CM | POA: Diagnosis not present

## 2017-03-21 DIAGNOSIS — Z8042 Family history of malignant neoplasm of prostate: Secondary | ICD-10-CM

## 2017-03-21 DIAGNOSIS — F419 Anxiety disorder, unspecified: Secondary | ICD-10-CM

## 2017-03-21 DIAGNOSIS — J449 Chronic obstructive pulmonary disease, unspecified: Secondary | ICD-10-CM

## 2017-03-21 DIAGNOSIS — Z95810 Presence of automatic (implantable) cardiac defibrillator: Secondary | ICD-10-CM

## 2017-03-21 NOTE — Progress Notes (Signed)
[place] OFFICE PROGRESS NOTE  Patient Care Team: Petra Kuba, MD as PCP - General (Family Medicine)   SUMMARY OF ONCOLOGIC HISTORY:  Oncology History   # SEP 2015- MULTIPLE MYELOMA [multiple PET pos Bone lesions; hypercalcemia s/p BMBx; FISH- Aneuploidy - gain of chromosome 7,9,15,FGFR3/4p16.3, and CCND1/11q13. Loss of MAF/16q23.1), cytogenetics normal 46XY] s/p Vel-Dex-Rev-Zometa; Excellent PR;   # MARCH 2016- REV-DEX ; FEB 2017-[discn Dex] Rev 25 mg 3 w On & 1 w Off; March 2017- M- protein 0.4gm/dl; K/L= 7.9; cont Rev;   # Revlimid on June 2018 [sec to ONJ]  # March 2017-  Left Middle Lobe cavitary lesion- ~110m- repeat Ct in 3-449m# AUG 2017- ONJ [[name], [place] #  ? 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  In the interim patient states her appetite is been fairly good.  Is gaining weight.  Patient states that he continues to have wound healing problems in his jaw; unable to get hyperbaric oxygen because of poor PFTs.  Continues to have episodes of nausea vomiting which is likely from anxiety.  He continues to have chronic pain-for which she is on fentanyl and Dilaudid.  Not any worse.  He continues to complain of chronic cough chronic shortness of breath. Not any worse.  Patient states that he recently quit smoking; because of social issues-back to smoking.  REVIEW OF SYSTEMS:  A complete 10 point review of system is done which is negative except mentioned above/history of present illness.   PAST MEDICAL HISTORY :  Past Medical History:   Diagnosis Date  . Anxiety   . Arthritis   . Atrial septal defect   . CHF (congestive heart failure) ([facility]  . COPD (chronic obstructive pulmonary disease) ([facility]  . Coronary artery disease   . Dysrhythmias   . GERD (gastroesophageal reflux disease)   . Hyperlipidemia   . Multiple myeloma ([facility]CD[place]  . Multiple myeloma ([facility]  . Myocardial infarction ([facility]  . Substance abuse ([facility]    PAST SURGICAL HISTORY :   Past Surgical History:  Procedure Laterality Date  . CARDIAC DEFIBRILLATOR PLACEMENT      FAMILY HISTORY :   Family History  Problem Relation Age of Onset  . COPD Mother   . CAD Mother   . Heart attack Father   . Prostate cancer Maternal Grandfather   . Kidney cancer Neg Hx   . Bladder Cancer Neg Hx     SOCIAL HISTORY:   Social History   Tobacco Use  . Smoking status: Current Every Day Smoker    Packs/day: 0.25    Years: 40.00    Pack years: 10.00    Types: Cigarettes  . Smokeless tobacco: Never Used  Substance Use Topics  . Alcohol use: No    Alcohol/week: 0.0 oz  . Drug use: No    ALLERGIES:  has No Known Allergies.  MEDICATIONS:  Current Outpatient Medications  Medication Sig Dispense Refill  . acetaminophen (TYLENOL) 325 MG tablet Take 2 tablets (650 mg total) by mouth every 6 (six) hours as  needed for mild pain (or Fever >/= 101).    Marland Kitchen albuterol (PROVENTIL HFA;VENTOLIN HFA) 108 (90 Base) MCG/ACT inhaler Inhale 2 puffs into the lungs every 6 (six) hours as needed for wheezing or shortness of breath. 1 Inhaler 1  . ALPRAZolam (XANAX) 0.5 MG tablet Take 1 tablet (0.5 mg total) by mouth 3 (three) times daily as needed for anxiety. 90 tablet 1  . aspirin 325 MG EC tablet Take 325 mg by mouth daily.    Marland Kitchen atorvastatin (LIPITOR) 20 MG tablet Take 20 mg by mouth daily.  2  . CHANTIX 1 MG tablet TAKE ONE TABLET BY MOUTH TWICE DAILY 56 tablet 0  . DULoxetine (CYMBALTA) 30 MG capsule TAKE ONE CAPSULE BY MOUTH EVERY DAY 90 capsule 1  . feeding supplement,  ENSURE ENLIVE, (ENSURE ENLIVE) LIQD Take 237 mLs by mouth 3 (three) times daily between meals. 90 Bottle 1  . fentaNYL (DURAGESIC - DOSED MCG/HR) 100 MCG/HR Place 1 patch (100 mcg total) onto the skin every other day. Use with a 25 mcg patch for total dose of 125 mcg 15 patch 0  . fentaNYL (DURAGESIC - DOSED MCG/HR) 25 MCG/HR patch Place 1 patch (25 mcg total) onto the skin every other day. Use with a 100 mcg patch for total dose of 125 mcg 15 patch 0  . furosemide (LASIX) 20 MG tablet Take 20 mg by mouth daily.  0  . gabapentin (NEURONTIN) 100 MG capsule TAKE TWO CAPSULES BY MOUTH THREE TIMES DAILY 180 capsule 3  . guaiFENesin-codeine (ROBITUSSIN AC) 100-10 MG/5ML syrup Take 5 mLs by mouth 3 (three) times daily as needed for cough. 120 mL 0  . HYDROmorphone (DILAUDID) 2 MG tablet Take 1 tablet (2 mg total) by mouth every 6 (six) hours as needed for severe pain. 120 tablet 0  . ipratropium-albuterol (DUONEB) 0.5-2.5 (3) MG/3ML SOLN Take 3 mLs by nebulization every 4 (four) hours as needed. 360 mL 3  . lenalidomide (REVLIMID) 25 MG capsule Take 1 capsule (25 mg total) by mouth as directed. 1 capsule daily for 21 days and 1 week off 21 capsule 0  . metoprolol tartrate (LOPRESSOR) 50 MG tablet Take 50 mg by mouth 2 (two) times daily.  2  . ondansetron (ZOFRAN) 4 MG tablet Take 1 tablet (4 mg total) by mouth every 8 (eight) hours as needed for nausea or vomiting. 21 tablet 3  . ondansetron (ZOFRAN) 8 MG tablet 1 pill every 8 hours as needed. 40 tablet 0  . OXYGEN Inhale 3 L into the lungs daily.    . polyethylene glycol (MIRALAX / GLYCOLAX) packet Take 17 g by mouth daily. 30 each 0  . tiotropium (SPIRIVA) 18 MCG inhalation capsule Place 1 capsule (18 mcg total) into inhaler and inhale daily. 30 capsule 12  . varenicline (CHANTIX STARTING MONTH PAK) 0.5 MG X 11 & 1 MG X 42 tablet Take one 0.5 mg tablet by mouth once daily for 3 days, then increase to one 0.5 mg tablet twice daily for 4 days, then increase  to one 1 mg tablet twice daily. 53 tablet 0   No current facility-administered medications for this visit.    Facility-Administered Medications Ordered in Other Visits  Medication Dose Route Frequency Provider Last Rate Last Dose  . 0.9 %  sodium chloride infusion   Intravenous Continuous Cammie Sickle, MD 10 mL/hr at 02/24/15 1520      PHYSICAL EXAMINATION: ECOG PERFORMANCE STATUS: 2 - Symptomatic, <50% confined to bed  BP 92/64 (BP Location: Left Arm, Patient Position: Sitting)   Pulse 92   Resp 16   Wt 151 lb 9.6 oz (68.8 kg)   BMI 21.14 kg/m   Filed Weights   03/21/17 1137  Weight: 151 lb 9.6 oz (68.8 kg)    GENERAL: Thin build cachectic-appearing Caucasian male patient. Alert, in  Mild- nod pain; .  Accompanied by friend/care giver.  he is walking by himself EYES: no pallor or icterus OROPHARYNX: Jaw bone exposed left lower jaw poor dentition. NECK: supple, no masses felt LYMPH:  no palpable lymphadenopathy in the cervical, axillary or inguinal regions LUNGS: With decreased bilateral breath sounds. No wheeze or crackles HEART/CVS: regular rate & rhythm and no murmurs;  No lower extremity edema. ABDOMEN:abdomen soft, non-tender and normal bowel sounds Musculoskeletal:no cyanosis of digits and no clubbing  PSYCH: alert & oriented x 3 with fluent speech NEURO: no focal motor/sensory deficits SKIN:  no rashes or significant lesions  LABORATORY DATA:  I have reviewed the data as listed    Component Value Date/Time   NA 135 03/15/2017 1520   NA 135 05/12/2014 1343   K 4.3 03/15/2017 1520   K 3.3 (L) 05/12/2014 1343   CL 99 (L) 03/15/2017 1520   CL 101 05/12/2014 1343   CO2 32 03/15/2017 1520   CO2 27 05/12/2014 1343   GLUCOSE 100 (H) 03/15/2017 1520   GLUCOSE 77 05/12/2014 1343   BUN 7 03/15/2017 1520   BUN 9 05/12/2014 1343   CREATININE 0.83 03/15/2017 1520   CREATININE 0.89 05/12/2014 1343   CALCIUM 9.4 03/15/2017 1520   CALCIUM 8.8 (L) 05/12/2014 1343    PROT 8.1 03/15/2017 1520   PROT 5.9 (L) 02/17/2014 1405   ALBUMIN 4.1 03/15/2017 1520   ALBUMIN 2.8 (L) 02/17/2014 1405   AST 16 03/15/2017 1520   AST 9 (L) 02/17/2014 1405   ALT 8 (L) 03/15/2017 1520   ALT 12 (L) 02/17/2014 1405   ALKPHOS 52 03/15/2017 1520   ALKPHOS 44 (L) 02/17/2014 1405   BILITOT 0.5 03/15/2017 1520   BILITOT 0.2 02/17/2014 1405   GFRNONAA >60 03/15/2017 1520   GFRNONAA >60 05/12/2014 1343   GFRAA >60 03/15/2017 1520   GFRAA >60 05/12/2014 1343    No results found for: SPEP, UPEP  Lab Results  Component Value Date   WBC 6.4 03/15/2017   NEUTROABS 3.9 03/15/2017   HGB 12.7 (L) 03/15/2017   HCT 38.6 (L) 03/15/2017   MCV 88.4 03/15/2017   PLT 279 03/15/2017      Chemistry      Component Value Date/Time   NA 135 03/15/2017 1520   NA 135 05/12/2014 1343   K 4.3 03/15/2017 1520   K 3.3 (L) 05/12/2014 1343   CL 99 (L) 03/15/2017 1520   CL 101 05/12/2014 1343   CO2 32 03/15/2017 1520   CO2 27 05/12/2014 1343   BUN 7 03/15/2017 1520   BUN 9 05/12/2014 1343   CREATININE 0.83 03/15/2017 1520   CREATININE 0.89 05/12/2014 1343      Component Value Date/Time   CALCIUM 9.4 03/15/2017 1520   CALCIUM 8.8 (L) 05/12/2014 1343   ALKPHOS 52 03/15/2017 1520   ALKPHOS 44 (L) 02/17/2014 1405   AST 16 03/15/2017 1520   AST 9 (L) 02/17/2014 1405   ALT 8 (L) 03/15/2017 1520   ALT 12 (L) 02/17/2014 1405   BILITOT 0.5 03/15/2017 1520   BILITOT 0.2 02/17/2014 1405      Results  for LINFORD, QUINTELA (MRN [id number]) as of 03/21/2017 11:44  Ref. Range 09/25/2013 04:46 10/17/2013 13:34 12/05/2013 14:02 01/16/2014 06:45 07/21/2014 10:01 10/13/2014 10:12 01/06/2015 09:43 04/05/2015 08:58 04/05/2015 09:07 04/05/2015 09:09 06/02/2015 13:20 09/09/2015 13:33 11/08/2015 13:50 02/03/2016 14:05 04/05/2016 15:00 05/05/2016 09:40 06/29/2016 13:55 09/01/2016 14:49 11/09/2016 11:04 01/05/2017 15:21 01/05/2017 15:26 03/15/2017 15:20  M Protein SerPl Elph-Mcnc Latest Ref Range: Not Observed g/dL        0.5 (H)  0.4 (H)  0.5 (H) 0.3 (H) 0.3 (H) 0.3 (H)  0.5 (H) Not Observed Not Observed 0.5 (H) 0.6 (H)  0.5 (H)   Results for TYREKE, KAESER (MRN [id number]) as of 03/21/2017 11:44  Ref. Range 09/25/2013 04:46 10/17/2013 13:34 12/05/2013 14:02 01/16/2014 06:45 07/21/2014 10:01 10/13/2014 10:12 01/06/2015 09:43 04/05/2015 08:58 04/05/2015 09:07 04/05/2015 09:09 06/02/2015 13:20 09/09/2015 13:33 11/08/2015 13:50 02/03/2016 14:05 04/05/2016 15:00 05/05/2016 09:40 06/29/2016 13:55 09/01/2016 14:49 11/09/2016 11:04 01/05/2017 15:21 01/05/2017 15:26 03/15/2017 15:20  Kappa free light chain Latest Ref Range: 3.3 - 19.4 mg/L     115.73 (H) 75.71 (H) 79.18 (H)   81.44 (H) 80.65 (H) 67.4 (H) 49.6 (H) 44.5 (H) 54.7 (H) 57.4 (H) 39.1 (H) 45.1 (H) 34.7 (H)  27.6 (H) 27.1 (H)  Lamda free light chains Latest Ref Range: 5.7 - 26.3 mg/L     6.32 7.48 7.86   10.24 14.36 22.2 18.1 14.6 25.2 29.2 (H) 17.5 22.9 14.7  15.1 10.4  Kappa, lamda light chain ratio Latest Ref Range: 0.26 - 1.65      18.31 (H) 10.12 (H) 10.07 (H)   7.95 (H) 5.62 (H) 3.04 (H) 2.74 (H) 3.05 (H) 2.17 (H) 1.97 (H) 2.23 (H) 1.97 (H) 2.36 (H)  1.83 (H) 2.61 (H)        ASSESSMENT & PLAN:   Multiple myeloma in relapse ([facility]) # MULTIPLE MYELOMA-with biochemical recurrence. HOLDING MAINTENANCE REVLIMID- sec ONJ/co-morbidities.   # No concerns for clinical progression noted this time; but biochemical recurrence [# Jan 2019- M protein- 0.5 ; K/L- slightly abnormal.  CBC CMP-unremarkable.] However recommend holding Revlimid [given wound healing issues/left jaw osteonecrosis of the jaw-C discussion below].   #Also discussed the role of new treatment options like daratumumab along with Revlimid or pomalidomide-patient has clinically significant progression.  Continue to hold therapy for now.  #  Osteonecrosis of the jaw- Denosumab discontinued. Continue hold Revlimid- for possible wound healing issues. Could not get HBO sec to poor pulmonary function.   # chronic  pain/anxiety- on fentanyl [137.6mg] and Dilaudid; Xanax. Continue current pain medications.  # Nausea/ vomitting- ? Anxiety- recommend zofran/pt already on ativan.   # follow up in 2 months/ cbc/cmp/m-panel labs.  Cc; Dr.khan; [facility]      GCammie Sickle MD 03/21/2017 12:13 PM

## 2017-03-21 NOTE — Assessment & Plan Note (Addendum)
#  MULTIPLE MYELOMA-with biochemical recurrence. HOLDING MAINTENANCE REVLIMID- sec ONJ/co-morbidities.   # No concerns for clinical progression noted this time; but biochemical recurrence [# Jan 2019- M protein- 0.5 ; K/L- slightly abnormal.  CBC CMP-unremarkable.] However recommend holding Revlimid [given wound healing issues/left jaw osteonecrosis of the jaw-C discussion below].   #Also discussed the role of new treatment options like daratumumab along with Revlimid or pomalidomide-patient has clinically significant progression.  Continue to hold therapy for now.  #  Osteonecrosis of the jaw- Denosumab discontinued. Continue hold Revlimid- for possible wound healing issues. Could not get HBO sec to poor pulmonary function.   # chronic pain/anxiety- on fentanyl [137.60mg] and Dilaudid; Xanax. Continue current pain medications.  # Nausea/ vomitting- ? Anxiety- recommend zofran/pt already on ativan.   # follow up in 2 months/ cbc/cmp/m-panel labs.  Cc; Dr.khan; [facility]

## 2017-03-29 ENCOUNTER — Other Ambulatory Visit: Payer: Self-pay | Admitting: *Deleted

## 2017-03-29 MED ORDER — FENTANYL 25 MCG/HR TD PT72: 25.0000 ug | MEDICATED_PATCH | TRANSDERMAL | 0 refills | Status: DC

## 2017-03-29 MED ORDER — FENTANYL 100 MCG/HR TD PT72: 100.0000 ug | MEDICATED_PATCH | TRANSDERMAL | 0 refills | Status: DC

## 2017-03-29 NOTE — Telephone Encounter (Signed)
Patient called and states that he is completely out f his fentanyl

## 2017-03-29 NOTE — Telephone Encounter (Signed)
Patient called [facility] requesting refill of fentanyl.  As mandated by the [place] STOP Act (Strengthen Opioid Misuse Prevention), the [place] Controlled Substance Reporting System ([place]) was reviewed for this patient.  Below is the past 74-months of controlled substance prescriptions as displayed by the registry.  I have personally consulted with my supervising physician, Dr. Rogue Bussing, who agrees that continuation of opiate therapy is medically appropriate at this time and he agrees to provide continual monitoring, including urine/blood drug screens, as indicated. Prescription sent electronically using [employer] secure transmission to requested pharmacy.   [place] Reviewed:     Beckey Rutter, DNP, AGNP-C [place] at [facility] [phone] (office) 03/29/17 2:42 PM

## 2017-03-29 NOTE — Addendum Note (Signed)
Addended by: Betti Cruz on: 03/29/2017 02:53 PM   Modules accepted: Orders

## 2017-04-02 ENCOUNTER — Other Ambulatory Visit: Payer: Self-pay | Admitting: Internal Medicine

## 2017-04-02 DIAGNOSIS — C9000 Multiple myeloma not having achieved remission: Secondary | ICD-10-CM

## 2017-04-09 ENCOUNTER — Encounter: Payer: Self-pay | Admitting: Internal Medicine

## 2017-04-09 ENCOUNTER — Other Ambulatory Visit: Payer: Self-pay | Admitting: *Deleted

## 2017-04-09 ENCOUNTER — Ambulatory Visit: Payer: Medicaid Other | Admitting: Internal Medicine

## 2017-04-09 VITALS — BP 106/86 | HR 50 | Resp 16 | Ht 71.0 in | Wt 149.0 lb

## 2017-04-09 DIAGNOSIS — F419 Anxiety disorder, unspecified: Secondary | ICD-10-CM

## 2017-04-09 DIAGNOSIS — J9611 Chronic respiratory failure with hypoxia: Secondary | ICD-10-CM

## 2017-04-09 DIAGNOSIS — C9000 Multiple myeloma not having achieved remission: Secondary | ICD-10-CM

## 2017-04-09 DIAGNOSIS — J449 Chronic obstructive pulmonary disease, unspecified: Secondary | ICD-10-CM

## 2017-04-09 MED ORDER — TIOTROPIUM BROMIDE MONOHYDRATE 18 MCG IN CAPS: 18.0000 ug | ORAL_CAPSULE | Freq: Every day | RESPIRATORY_TRACT | 6 refills | Status: DC

## 2017-04-09 MED ORDER — FLUTICASONE-SALMETEROL 250-50 MCG/DOSE IN AEPB: 1.0000 | INHALATION_SPRAY | Freq: Two times a day (BID) | RESPIRATORY_TRACT | 12 refills | Status: DC

## 2017-04-09 MED ORDER — IPRATROPIUM-ALBUTEROL 0.5-2.5 (3) MG/3ML IN SOLN: 3.0000 mL | RESPIRATORY_TRACT | 3 refills | Status: DC | PRN

## 2017-04-09 MED ORDER — ALBUTEROL SULFATE HFA 108 (90 BASE) MCG/ACT IN AERS: 2.0000 | INHALATION_SPRAY | Freq: Four times a day (QID) | RESPIRATORY_TRACT | 6 refills | Status: DC | PRN

## 2017-04-09 MED ORDER — NICOTINE 21 MG/24HR TD PT24: 21.0000 mg | MEDICATED_PATCH | TRANSDERMAL | 1 refills | Status: DC

## 2017-04-09 NOTE — Progress Notes (Signed)
  Name: [name] MRN:   [id number] DOB:   [birth year]          consult date 3.18.19   Hospital follow-up patient seen by Dr. Alva Garnet in the hospital  BRIEF PATIENT DESCRIPTION:  57 yo male with known history of Diastolic Heart failure, COPD and Multiple Myeloma admitted with acute encephalopathy, nausea/vomiting, acute on chronic hypoxic respiratory failure secondary to possible aspiration pneumonia and AECOPD  SIGNIFICANT EVENTS  5/1 Patient admitted to the ICU with increased sob requiring NRB  CC follow up COPD and SOB HPI + chronic shortness of breath + intermittent wheezing  On chronic oxygen therapy Continues to smoke daily but has decreased significnatly Has had nightmares on chantix Wants to try  nicotine patches  His wheezing has significantly improved He still takes Spiriva and Advair as prescribed  Ambulatory pulse oximetry performed in the office today shows significant hypoxia with O2 sat of 87% on exertion  REVIEW OF SYSTEMS:   Constitutional: Negative for fever, chills, weight loss, malaise/fatigue and diaphoresis.  HENT: Negative for hearing loss, ear pain, nosebleeds, congestion, sore throat, neck pain, tinnitus and ear discharge.  Eyes: Negative for blurred vision, double vision, photophobia, pain, discharge and redness.  Respiratory: +cough, -hemoptysis, +sputum production, +shortness of breath, _wheezing and stridor.   Cardiovascular: Negative for chest pain, palpitations, orthopnea, claudication, leg swelling and PND.  Gastrointestinal: neg for N/V Skin: Negative for itching and rash.  Neurological: Negative for dizziness   BP 106/86 (BP Location: Left Arm, Cuff Size: Normal)   Pulse (!) 50   Resp 16   Ht _0  (1.803 m)   Wt 149 lb (67.6 kg)   SpO2 95%   BMI 20.78 kg/m     PHYSICAL EXAMINATION: General: middle aged frail male NAD, thin Neuro: alert and oriented, follows commands HEENT:  AT,[place], no JBD Cardiovascular: s1s2 rrr, no  M/R/G Lungs: crackles bilateral bases, +wheezing Abdomen:  Hypoactive BS x4, soft, non tender, non distended  Musculoskeletal: No edema/cyanosis noted Skin:  Warm,dry and Intact, no rashes or lesions   CXR 05/23/16 I have Independently reviewed images of CXR   Interpretation:no acutue process, no effusions   ASSESSMENT / PLAN:   57 year old ill appearing thin white male with end-stage COPD with progressive chronic hypoxic respiratory failure in the setting of pulmonary cachexia and deconditioned state with ongoing tobacco abuse with underlying multiple myeloma   #1 shortness of breath and dyspnea on exertion with ongoing wheezing  Related to his severe COPD  And ongoing tobacco abuse    #2 severe end-stage COPD  -Continue Advair as prescribed Continue SPIRIVA as prescribed Albuterol as needed   #3 chronic hypoxic respiratory failure Continue oxygen a prescribed  he will need oxygen 24/7 Patient uses and benefits from oxygen use  #4 cough from underlying COPD and hypoxia Also related to smoking  #5 tobacco abuse Smoking cessation strongly advised approx 5 minutes spent chantix gave patient nightmares Will try nicotine patches  #6 multiple myeloma Follow-up oncology as scheduled   Patient/Family are satisfied with Plan of action and management. All questions answered Overall prognosis is poor patient with end-stage respiratory failure with chronic hypoxia  Follow-up in 6  monthss for reassessment   Stacie Knutzen Patricia Pesa, M.D.  [facility]  Medical Director [place] Director [facility] Cardio-Pulmonary Department

## 2017-04-09 NOTE — Patient Instructions (Signed)
Continue inhalers as prescribed STOP SMOKING  Continue oxygen as prescribed

## 2017-04-10 MED ORDER — HYDROMORPHONE HCL 2 MG PO TABS: 2.0000 mg | ORAL_TABLET | Freq: Four times a day (QID) | ORAL | 0 refills | Status: DC | PRN

## 2017-04-10 NOTE — Addendum Note (Signed)
Addended by: Sabino Gasser on: 04/10/2017 10:22 AM   Modules accepted: Orders

## 2017-04-17 ENCOUNTER — Telehealth: Payer: Self-pay | Admitting: Internal Medicine

## 2017-04-17 MED ORDER — AZITHROMYCIN 250 MG PO TABS: ORAL_TABLET | ORAL | 0 refills | Status: AC

## 2017-04-17 MED ORDER — PREDNISONE 20 MG PO TABS: ORAL_TABLET | ORAL | 0 refills | Status: DC

## 2017-04-17 NOTE — Telephone Encounter (Signed)
Pt is aware of below message and voiced his understanding.  Rx for prednisone and zpak has been sent to preferred pharmacy. Nothing further is needed.

## 2017-04-17 NOTE — Telephone Encounter (Signed)
Called and spoke with pt.  Pt reports of sob with exertion, prod cough with brown to yellow mucus, headache, nasal drainage clear in color occ mixed with blood x3d. Denies fever, chills, sweats or body aches. Using Ventolin tid with mild relief.   DK please advise. Thanks.

## 2017-04-17 NOTE — Telephone Encounter (Signed)
Pt spouse calling stating patient is not doing any better They were advised to call us for patient may need to do a round of antibiotics   Please call back

## 2017-04-17 NOTE — Telephone Encounter (Signed)
lmtcb x1 for pt. 

## 2017-04-17 NOTE — Telephone Encounter (Signed)
Please prescribe  Prednisone 40 mg daily for 7 days  Z pak

## 2017-04-26 ENCOUNTER — Other Ambulatory Visit: Payer: Self-pay | Admitting: *Deleted

## 2017-04-27 ENCOUNTER — Other Ambulatory Visit: Payer: Self-pay | Admitting: Internal Medicine

## 2017-04-27 MED ORDER — FENTANYL 100 MCG/HR TD PT72: 100.0000 ug | MEDICATED_PATCH | TRANSDERMAL | 0 refills | Status: DC

## 2017-04-27 MED ORDER — FENTANYL 25 MCG/HR TD PT72: 25.0000 ug | MEDICATED_PATCH | TRANSDERMAL | 0 refills | Status: DC

## 2017-05-01 ENCOUNTER — Other Ambulatory Visit: Payer: Self-pay | Admitting: *Deleted

## 2017-05-01 ENCOUNTER — Other Ambulatory Visit: Payer: Self-pay | Admitting: Internal Medicine

## 2017-05-01 DIAGNOSIS — F419 Anxiety disorder, unspecified: Secondary | ICD-10-CM

## 2017-05-01 DIAGNOSIS — C9000 Multiple myeloma not having achieved remission: Secondary | ICD-10-CM

## 2017-05-01 MED ORDER — ALPRAZOLAM 0.5 MG PO TABS: 0.5000 mg | ORAL_TABLET | Freq: Three times a day (TID) | ORAL | 1 refills | Status: DC | PRN

## 2017-05-08 ENCOUNTER — Other Ambulatory Visit: Payer: Self-pay | Admitting: *Deleted

## 2017-05-08 DIAGNOSIS — C9000 Multiple myeloma not having achieved remission: Secondary | ICD-10-CM

## 2017-05-09 MED ORDER — HYDROMORPHONE HCL 2 MG PO TABS: 2.0000 mg | ORAL_TABLET | Freq: Four times a day (QID) | ORAL | 0 refills | Status: DC | PRN

## 2017-05-14 ENCOUNTER — Inpatient Hospital Stay: Payer: Medicaid Other | Attending: Internal Medicine

## 2017-05-14 DIAGNOSIS — Z8739 Personal history of other diseases of the musculoskeletal system and connective tissue: Secondary | ICD-10-CM | POA: Diagnosis not present

## 2017-05-14 DIAGNOSIS — K219 Gastro-esophageal reflux disease without esophagitis: Secondary | ICD-10-CM | POA: Diagnosis not present

## 2017-05-14 DIAGNOSIS — E785 Hyperlipidemia, unspecified: Secondary | ICD-10-CM | POA: Insufficient documentation

## 2017-05-14 DIAGNOSIS — Z95810 Presence of automatic (implantable) cardiac defibrillator: Secondary | ICD-10-CM | POA: Diagnosis not present

## 2017-05-14 DIAGNOSIS — M1990 Unspecified osteoarthritis, unspecified site: Secondary | ICD-10-CM | POA: Insufficient documentation

## 2017-05-14 DIAGNOSIS — C9002 Multiple myeloma in relapse: Secondary | ICD-10-CM | POA: Insufficient documentation

## 2017-05-14 DIAGNOSIS — G8929 Other chronic pain: Secondary | ICD-10-CM | POA: Insufficient documentation

## 2017-05-14 DIAGNOSIS — Z8042 Family history of malignant neoplasm of prostate: Secondary | ICD-10-CM | POA: Insufficient documentation

## 2017-05-14 DIAGNOSIS — Z79899 Other long term (current) drug therapy: Secondary | ICD-10-CM | POA: Diagnosis not present

## 2017-05-14 DIAGNOSIS — F17210 Nicotine dependence, cigarettes, uncomplicated: Secondary | ICD-10-CM | POA: Diagnosis not present

## 2017-05-14 DIAGNOSIS — I509 Heart failure, unspecified: Secondary | ICD-10-CM | POA: Insufficient documentation

## 2017-05-14 DIAGNOSIS — J449 Chronic obstructive pulmonary disease, unspecified: Secondary | ICD-10-CM | POA: Insufficient documentation

## 2017-05-14 DIAGNOSIS — I2510 Atherosclerotic heart disease of native coronary artery without angina pectoris: Secondary | ICD-10-CM | POA: Insufficient documentation

## 2017-05-14 DIAGNOSIS — R5383 Other fatigue: Secondary | ICD-10-CM | POA: Diagnosis not present

## 2017-05-14 DIAGNOSIS — Z7982 Long term (current) use of aspirin: Secondary | ICD-10-CM | POA: Diagnosis not present

## 2017-05-14 DIAGNOSIS — Z9981 Dependence on supplemental oxygen: Secondary | ICD-10-CM | POA: Diagnosis not present

## 2017-05-14 DIAGNOSIS — R110 Nausea: Secondary | ICD-10-CM | POA: Insufficient documentation

## 2017-05-14 DIAGNOSIS — F419 Anxiety disorder, unspecified: Secondary | ICD-10-CM | POA: Insufficient documentation

## 2017-05-14 LAB — CBC WITH DIFFERENTIAL/PLATELET
BASOS PCT: 1 %
Basophils Absolute: 0 10*3/uL (ref 0–0.1)
EOS ABS: 0.4 10*3/uL (ref 0–0.7)
EOS PCT: 8 %
HCT: 38.1 % — ABNORMAL LOW (ref 40.0–52.0)
HEMOGLOBIN: 12.8 g/dL — AB (ref 13.0–18.0)
Lymphocytes Relative: 31 %
Lymphs Abs: 1.7 10*3/uL (ref 1.0–3.6)
MCH: 29.7 pg (ref 26.0–34.0)
MCHC: 33.7 g/dL (ref 32.0–36.0)
MCV: 87.9 fL (ref 80.0–100.0)
Monocytes Absolute: 0.6 10*3/uL (ref 0.2–1.0)
Monocytes Relative: 12 %
NEUTROS PCT: 48 %
Neutro Abs: 2.7 10*3/uL (ref 1.4–6.5)
PLATELETS: 245 10*3/uL (ref 150–440)
RBC: 4.33 MIL/uL — ABNORMAL LOW (ref 4.40–5.90)
RDW: 13.8 % (ref 11.5–14.5)
WBC: 5.4 10*3/uL (ref 3.8–10.6)

## 2017-05-14 LAB — COMPREHENSIVE METABOLIC PANEL
ALK PHOS: 45 U/L (ref 38–126)
ALT: 9 U/L — AB (ref 17–63)
AST: 17 U/L (ref 15–41)
Albumin: 4.1 g/dL (ref 3.5–5.0)
Anion gap: 9 (ref 5–15)
BUN: 8 mg/dL (ref 6–20)
CALCIUM: 9.3 mg/dL (ref 8.9–10.3)
CO2: 32 mmol/L (ref 22–32)
CREATININE: 0.8 mg/dL (ref 0.61–1.24)
Chloride: 96 mmol/L — ABNORMAL LOW (ref 101–111)
Glucose, Bld: 98 mg/dL (ref 65–99)
Potassium: 4.1 mmol/L (ref 3.5–5.1)
Sodium: 137 mmol/L (ref 135–145)
Total Bilirubin: 0.4 mg/dL (ref 0.3–1.2)
Total Protein: 7.4 g/dL (ref 6.5–8.1)

## 2017-05-15 LAB — KAPPA/LAMBDA LIGHT CHAINS
KAPPA, LAMDA LIGHT CHAIN RATIO: 2.1 — AB (ref 0.26–1.65)
Kappa free light chain: 24.4 mg/L — ABNORMAL HIGH (ref 3.3–19.4)
LAMDA FREE LIGHT CHAINS: 11.6 mg/L (ref 5.7–26.3)

## 2017-05-16 ENCOUNTER — Inpatient Hospital Stay (HOSPITAL_BASED_OUTPATIENT_CLINIC_OR_DEPARTMENT_OTHER): Payer: Medicaid Other | Admitting: Internal Medicine

## 2017-05-16 ENCOUNTER — Other Ambulatory Visit: Payer: Self-pay

## 2017-05-16 ENCOUNTER — Encounter: Payer: Self-pay | Admitting: Internal Medicine

## 2017-05-16 ENCOUNTER — Telehealth: Payer: Self-pay | Admitting: Internal Medicine

## 2017-05-16 VITALS — BP 99/65 | HR 80 | Temp 97.9°F | Resp 22 | Ht 71.0 in | Wt 148.0 lb

## 2017-05-16 DIAGNOSIS — R110 Nausea: Secondary | ICD-10-CM | POA: Diagnosis not present

## 2017-05-16 DIAGNOSIS — I2510 Atherosclerotic heart disease of native coronary artery without angina pectoris: Secondary | ICD-10-CM

## 2017-05-16 DIAGNOSIS — R5383 Other fatigue: Secondary | ICD-10-CM | POA: Diagnosis not present

## 2017-05-16 DIAGNOSIS — Z8739 Personal history of other diseases of the musculoskeletal system and connective tissue: Secondary | ICD-10-CM

## 2017-05-16 DIAGNOSIS — Z79899 Other long term (current) drug therapy: Secondary | ICD-10-CM

## 2017-05-16 DIAGNOSIS — K219 Gastro-esophageal reflux disease without esophagitis: Secondary | ICD-10-CM

## 2017-05-16 DIAGNOSIS — M1990 Unspecified osteoarthritis, unspecified site: Secondary | ICD-10-CM | POA: Diagnosis not present

## 2017-05-16 DIAGNOSIS — C9002 Multiple myeloma in relapse: Secondary | ICD-10-CM

## 2017-05-16 DIAGNOSIS — E785 Hyperlipidemia, unspecified: Secondary | ICD-10-CM

## 2017-05-16 DIAGNOSIS — Z9981 Dependence on supplemental oxygen: Secondary | ICD-10-CM

## 2017-05-16 DIAGNOSIS — F419 Anxiety disorder, unspecified: Secondary | ICD-10-CM

## 2017-05-16 DIAGNOSIS — I509 Heart failure, unspecified: Secondary | ICD-10-CM

## 2017-05-16 DIAGNOSIS — F17210 Nicotine dependence, cigarettes, uncomplicated: Secondary | ICD-10-CM

## 2017-05-16 DIAGNOSIS — Z7982 Long term (current) use of aspirin: Secondary | ICD-10-CM

## 2017-05-16 DIAGNOSIS — J449 Chronic obstructive pulmonary disease, unspecified: Secondary | ICD-10-CM

## 2017-05-16 DIAGNOSIS — G8929 Other chronic pain: Secondary | ICD-10-CM

## 2017-05-16 LAB — MULTIPLE MYELOMA PANEL, SERUM
ALBUMIN SERPL ELPH-MCNC: 3.7 g/dL (ref 2.9–4.4)
ALPHA 1: 0.3 g/dL (ref 0.0–0.4)
Albumin/Glob SerPl: 1.2 (ref 0.7–1.7)
Alpha2 Glob SerPl Elph-Mcnc: 0.8 g/dL (ref 0.4–1.0)
B-Globulin SerPl Elph-Mcnc: 1 g/dL (ref 0.7–1.3)
GAMMA GLOB SERPL ELPH-MCNC: 1.1 g/dL (ref 0.4–1.8)
GLOBULIN, TOTAL: 3.2 g/dL (ref 2.2–3.9)
IGA: 127 mg/dL (ref 90–386)
IGM (IMMUNOGLOBULIN M), SRM: 66 mg/dL (ref 20–172)
IgG (Immunoglobin G), Serum: 1284 mg/dL (ref 700–1600)
M Protein SerPl Elph-Mcnc: 0.5 g/dL — ABNORMAL HIGH
TOTAL PROTEIN ELP: 6.9 g/dL (ref 6.0–8.5)

## 2017-05-16 NOTE — Telephone Encounter (Signed)
Please make a referral to [facility] care program-diagnosis fatigue/multiple myeloma. Thx

## 2017-05-16 NOTE — Addendum Note (Signed)
Addended by: Sabino Gasser on: 05/16/2017 12:03 PM   Modules accepted: Orders

## 2017-05-16 NOTE — Assessment & Plan Note (Addendum)
#  MULTIPLE MYELOMA-with biochemical recurrence. HOLDING MAINTENANCE REVLIMID- sec ONJ/co-morbidities.   # No concerns for clinical progression noted this time; but biochemical recurrence [# Feb 2019- M protein- 0.5 ; K/L- slightly abnormal.  April 2019 myeloma panel pending. / CBC CMP-unremarkable.]   # However recommend continued holding Revlimid [given wound healing issues/left jaw osteonecrosis of the jaw-C discussion below].    #Also discussed the role of new treatment options like daratumumab along with Revlimid or pomalidomide-patient has clinically significant progression.    #  Osteonecrosis of the jaw- Denosumab discontinued.  # chronic pain/anxiety- on fentanyl [137.74mg] and Dilaudid; Xanax. Continue current pain medications.  #[facility] patient reluctant/but agrees with referral.   # follow up in 2 months/ cbc/cmp/m-panel labs- 1 week prior.

## 2017-05-16 NOTE — Progress Notes (Signed)
[facility] OFFICE PROGRESS NOTE  Patient Care Team: Petra Kuba, MD as PCP - General (Family Medicine)   SUMMARY OF ONCOLOGIC HISTORY:  Oncology History   # SEP 2015- MULTIPLE MYELOMA [multiple PET pos Bone lesions; hypercalcemia s/p BMBx; FISH- Aneuploidy - gain of chromosome 7,9,15,FGFR3/4p16.3, and CCND1/11q13. Loss of MAF/16q23.1), cytogenetics normal 46XY] s/p Vel-Dex-Rev-Zometa; Excellent PR;   # MARCH 2016- REV-DEX ; FEB 2017-[discn Dex] Rev 25 mg 3 w On & 1 w Off; March 2017- M- protein 0.4gm/dl; K/L= 7.9; cont Rev;   # Revlimid on June 2018 [sec to ONJ]  # March 2017-  Left Middle Lobe cavitary lesion- ~5m- repeat Ct in 3-438m# AUG 2017- ONJ [[name], [place] #  ? 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  Patient complains of worsening fatigue.  Has chronic shortness of breath on oxygen.   He continues to have chronic pain-for which she is on fentanyl and Dilaudid.  Not any worse.  His chronic nausea/anxiety stable.  Because of social issues he States that he is under a lot of stress.   REVIEW OF SYSTEMS:  A complete 10 point review of system is done which is negative except mentioned above/history of present illness.   PAST MEDICAL HISTORY :  Past Medical History:  Diagnosis Date  . Anxiety   . Arthritis   . Atrial septal defect   . CHF (congestive heart failure) (HCC[facility]  . COPD (chronic obstructive pulmonary disease) (HCM[facility]  . Coronary artery disease   . Dysrhythmias   . GERD (gastroesophageal reflux disease)   . Hyperlipidemia    . Multiple myeloma ([facility]  . Multiple myeloma ([facility]  . Myocardial infarction ([facility] [place]  . Substance abuse (HC[facility]    PAST SURGICAL HISTORY :   Past Surgical History:  Procedure Laterality Date  . CARDIAC DEFIBRILLATOR PLACEMENT      FAMILY HISTORY :   Family History  Problem Relation Age of Onset  . COPD Mother   . CAD Mother   . Heart attack Father   . Prostate cancer Maternal Grandfather   . Kidney cancer Neg Hx   . Bladder Cancer Neg Hx     SOCIAL HISTORY:   Social History   Tobacco Use  . Smoking status: Current Every Day Smoker    Packs/day: 0.25    Years: 40.00    Pack years: 10.00    Types: Cigarettes  . Smokeless tobacco: Never Used  Substance Use Topics  . Alcohol use: No    Alcohol/week: 0.0 oz  . Drug use: No    ALLERGIES:  has No Known Allergies.  MEDICATIONS:  Current Outpatient Medications  Medication Sig Dispense Refill  . albuterol (PROVENTIL HFA;VENTOLIN HFA) 108 (90 Base) MCG/ACT inhaler Inhale 2 puffs into the lungs every 6 (six) hours as needed for wheezing or shortness of breath. 1 Inhaler 6  . ALPRAZolam (XANAX) 0.5 MG tablet Take 1 tablet (0.5 mg total) by mouth 3 (three) times daily as needed for anxiety. 90 tablet 1  . aspirin 325 MG EC tablet  Take 325 mg by mouth daily.    Marland Kitchen atorvastatin (LIPITOR) 20 MG tablet Take 20 mg by mouth daily.  2  . DULoxetine (CYMBALTA) 30 MG capsule TAKE ONE CAPSULE BY MOUTH EVERY DAY 90 capsule 1  . feeding supplement, ENSURE ENLIVE, (ENSURE ENLIVE) LIQD Take 237 mLs by mouth 3 (three) times daily between meals. 90 Bottle 1  . fentaNYL (DURAGESIC - DOSED MCG/HR) 100 MCG/HR Place 1 patch (100 mcg total) onto the skin every other day. Use with a 25 mcg patch for total dose of 125 mcg 15 patch 0  . fentaNYL (DURAGESIC - DOSED MCG/HR) 25 MCG/HR patch Place 1 patch (25 mcg total) onto the skin every other day. Use with a 100 mcg patch for total dose of 125 mcg 15 patch 0  . Fluticasone-Salmeterol (ADVAIR DISKUS)  250-50 MCG/DOSE AEPB Inhale 1 puff into the lungs 2 (two) times daily. 1 each 12  . furosemide (LASIX) 20 MG tablet Take 20 mg by mouth daily.  0  . gabapentin (NEURONTIN) 100 MG capsule TAKE TWO CAPSULES BY MOUTH THREE TIMES DAILY 180 capsule 3  . HYDROmorphone (DILAUDID) 2 MG tablet Take 1 tablet (2 mg total) by mouth every 6 (six) hours as needed for severe pain. 120 tablet 0  . ipratropium-albuterol (DUONEB) 0.5-2.5 (3) MG/3ML SOLN Take 3 mLs by nebulization every 4 (four) hours as needed. 360 mL 3  . metoprolol tartrate (LOPRESSOR) 50 MG tablet Take 50 mg by mouth 2 (two) times daily.  2  . OXYGEN Inhale 3 L into the lungs daily.    Marland Kitchen tiotropium (SPIRIVA HANDIHALER) 18 MCG inhalation capsule Place 1 capsule (18 mcg total) into inhaler and inhale daily. 30 capsule 6  . acetaminophen (TYLENOL) 325 MG tablet Take 2 tablets (650 mg total) by mouth every 6 (six) hours as needed for mild pain (or Fever >/= 101). (Patient not taking: Reported on 05/16/2017)    . guaiFENesin-codeine (ROBITUSSIN AC) 100-10 MG/5ML syrup Take 5 mLs by mouth 3 (three) times daily as needed for cough. (Patient not taking: Reported on 05/16/2017) 120 mL 0  . ondansetron (ZOFRAN) 4 MG tablet Take 1 tablet (4 mg total) by mouth every 8 (eight) hours as needed for nausea or vomiting. (Patient not taking: Reported on 05/16/2017) 21 tablet 3  . ondansetron (ZOFRAN) 8 MG tablet 1 pill every 8 hours as needed. (Patient not taking: Reported on 05/16/2017) 40 tablet 0  . polyethylene glycol (MIRALAX / GLYCOLAX) packet Take 17 g by mouth daily. (Patient not taking: Reported on 05/16/2017) 30 each 0   No current facility-administered medications for this visit.    Facility-Administered Medications Ordered in Other Visits  Medication Dose Route Frequency Provider Last Rate Last Dose  . 0.9 %  sodium chloride infusion   Intravenous Continuous Cammie Sickle, MD 10 mL/hr at 02/24/15 1520      PHYSICAL EXAMINATION: ECOG PERFORMANCE  STATUS: 2 - Symptomatic, <50% confined to bed  BP 99/65   Pulse 80   Temp 97.9 F (36.6 C) (Tympanic)   Resp (!) 22   Ht '5\' 11"'$  (1.803 m)   Wt 148 lb (67.1 kg)   SpO2 95%   BMI 20.64 kg/m   Filed Weights   05/16/17 1042  Weight: 148 lb (67.1 kg)    GENERAL: Thin build cachectic-appearing Caucasian male patient. Alert, in  Mild- nod pain; .  Accompanied by friend/care giver.  he is walking by himself EYES: no pallor or icterus OROPHARYNX: Jaw bone  exposed left lower jaw poor dentition. NECK: supple, no masses felt LYMPH:  no palpable lymphadenopathy in the cervical, axillary or inguinal regions LUNGS: With decreased bilateral breath sounds. No wheeze or crackles HEART/CVS: regular rate & rhythm and no murmurs;  No lower extremity edema. ABDOMEN:abdomen soft, non-tender and normal bowel sounds Musculoskeletal:no cyanosis of digits and no clubbing  PSYCH: alert & oriented x 3 with fluent speech NEURO: no focal motor/sensory deficits SKIN:  no rashes or significant lesions  LABORATORY DATA:  I have reviewed the data as listed    Component Value Date/Time   NA 137 05/14/2017 1515   NA 135 05/12/2014 1343   K 4.1 05/14/2017 1515   K 3.3 (L) 05/12/2014 1343   CL 96 (L) 05/14/2017 1515   CL 101 05/12/2014 1343   CO2 32 05/14/2017 1515   CO2 27 05/12/2014 1343   GLUCOSE 98 05/14/2017 1515   GLUCOSE 77 05/12/2014 1343   BUN 8 05/14/2017 1515   BUN 9 05/12/2014 1343   CREATININE 0.80 05/14/2017 1515   CREATININE 0.89 05/12/2014 1343   CALCIUM 9.3 05/14/2017 1515   CALCIUM 8.8 (L) 05/12/2014 1343   PROT 7.4 05/14/2017 1515   PROT 5.9 (L) 02/17/2014 1405   ALBUMIN 4.1 05/14/2017 1515   ALBUMIN 2.8 (L) 02/17/2014 1405   AST 17 05/14/2017 1515   AST 9 (L) 02/17/2014 1405   ALT 9 (L) 05/14/2017 1515   ALT 12 (L) 02/17/2014 1405   ALKPHOS 45 05/14/2017 1515   ALKPHOS 44 (L) 02/17/2014 1405   BILITOT 0.4 05/14/2017 1515   BILITOT 0.2 02/17/2014 1405   GFRNONAA >60  05/14/2017 1515   GFRNONAA >60 05/12/2014 1343   GFRAA >60 05/14/2017 1515   GFRAA >60 05/12/2014 1343    No results found for: SPEP, UPEP  Lab Results  Component Value Date   WBC 5.4 05/14/2017   NEUTROABS 2.7 05/14/2017   HGB 12.8 (L) 05/14/2017   HCT 38.1 (L) 05/14/2017   MCV 87.9 05/14/2017   PLT 245 05/14/2017      Chemistry      Component Value Date/Time   NA 137 05/14/2017 1515   NA 135 05/12/2014 1343   K 4.1 05/14/2017 1515   K 3.3 (L) 05/12/2014 1343   CL 96 (L) 05/14/2017 1515   CL 101 05/12/2014 1343   CO2 32 05/14/2017 1515   CO2 27 05/12/2014 1343   BUN 8 05/14/2017 1515   BUN 9 05/12/2014 1343   CREATININE 0.80 05/14/2017 1515   CREATININE 0.89 05/12/2014 1343      Component Value Date/Time   CALCIUM 9.3 05/14/2017 1515   CALCIUM 8.8 (L) 05/12/2014 1343   ALKPHOS 45 05/14/2017 1515   ALKPHOS 44 (L) 02/17/2014 1405   AST 17 05/14/2017 1515   AST 9 (L) 02/17/2014 1405   ALT 9 (L) 05/14/2017 1515   ALT 12 (L) 02/17/2014 1405   BILITOT 0.4 05/14/2017 1515   BILITOT 0.2 02/17/2014 1405      Results for NAVEEN, CLARDY A (MRN [id number]) as of 03/21/2017 11:44  Ref. Range 09/25/2013 04:46 10/17/2013 13:34 12/05/2013 14:02 01/16/2014 06:45 07/21/2014 10:01 10/13/2014 10:12 01/06/2015 09:43 04/05/2015 08:58 04/05/2015 09:07 04/05/2015 09:09 06/02/2015 13:20 09/09/2015 13:33 11/08/2015 13:50 02/03/2016 14:05 04/05/2016 15:00 05/05/2016 09:40 06/29/2016 13:55 09/01/2016 14:49 11/09/2016 11:04 01/05/2017 15:21 01/05/2017 15:26 03/15/2017 15:20  M Protein SerPl Elph-Mcnc Latest Ref Range: Not Observed g/dL       0.5 (H)  0.4 (H)  0.5 (H) 0.3 (H)  0.3 (H) 0.3 (H)  0.5 (H) Not Observed Not Observed 0.5 (H) 0.6 (H)  0.5 (H)   Results for TYLEE, NEWBY (MRN [id number]) as of 03/21/2017 11:44  Ref. Range 09/25/2013 04:46 10/17/2013 13:34 12/05/2013 14:02 01/16/2014 06:45 07/21/2014 10:01 10/13/2014 10:12 01/06/2015 09:43 04/05/2015 08:58 04/05/2015 09:07 04/05/2015 09:09 06/02/2015 13:20 09/09/2015  13:33 11/08/2015 13:50 02/03/2016 14:05 04/05/2016 15:00 05/05/2016 09:40 06/29/2016 13:55 09/01/2016 14:49 11/09/2016 11:04 01/05/2017 15:21 01/05/2017 15:26 03/15/2017 15:20  Kappa free light chain Latest Ref Range: 3.3 - 19.4 mg/L     115.73 (H) 75.71 (H) 79.18 (H)   81.44 (H) 80.65 (H) 67.4 (H) 49.6 (H) 44.5 (H) 54.7 (H) 57.4 (H) 39.1 (H) 45.1 (H) 34.7 (H)  27.6 (H) 27.1 (H)  Lamda free light chains Latest Ref Range: 5.7 - 26.3 mg/L     6.32 7.48 7.86   10.24 14.36 22.2 18.1 14.6 25.2 29.2 (H) 17.5 22.9 14.7  15.1 10.4  Kappa, lamda light chain ratio Latest Ref Range: 0.26 - 1.65      18.31 (H) 10.12 (H) 10.07 (H)   7.95 (H) 5.62 (H) 3.04 (H) 2.74 (H) 3.05 (H) 2.17 (H) 1.97 (H) 2.23 (H) 1.97 (H) 2.36 (H)  1.83 (H) 2.61 (H)        ASSESSMENT & PLAN:   Multiple myeloma in relapse ([place]) # MULTIPLE MYELOMA-with biochemical recurrence. HOLDING MAINTENANCE REVLIMID- sec ONJ/co-morbidities.   # No concerns for clinical progression noted this time; but biochemical recurrence [# Feb 2019- M protein- 0.5 ; K/L- slightly abnormal.  April 2019 myeloma panel pending. / CBC CMP-unremarkable.]   # However recommend continued holding Revlimid [given wound healing issues/left jaw osteonecrosis of the jaw-C discussion below].    #Also discussed the role of new treatment options like daratumumab along with Revlimid or pomalidomide-patient has clinically significant progression.    #  Osteonecrosis of the jaw- Denosumab discontinued.  # chronic pain/anxiety- on fentanyl [137.62mg] and Dilaudid; Xanax. Continue current pain medications.  #[facility]ifactorial[facility]; patient reluctant/but agrees with referral.   # follow up in 2 months/ cbc/cmp/m-panel labs- 1 week prior.      GCammie Sickle MD 05/16/2017 11:47 AM

## 2017-05-16 NOTE — Telephone Encounter (Signed)
Care program referral entered.

## 2017-05-25 ENCOUNTER — Other Ambulatory Visit: Payer: Self-pay | Admitting: Internal Medicine

## 2017-05-25 ENCOUNTER — Other Ambulatory Visit: Payer: Self-pay | Admitting: *Deleted

## 2017-05-25 MED ORDER — FENTANYL 100 MCG/HR TD PT72: 100.0000 ug | MEDICATED_PATCH | TRANSDERMAL | 0 refills | Status: DC

## 2017-05-25 MED ORDER — FENTANYL 25 MCG/HR TD PT72: 25.0000 ug | MEDICATED_PATCH | TRANSDERMAL | 0 refills | Status: DC

## 2017-05-28 ENCOUNTER — Other Ambulatory Visit: Payer: Self-pay | Admitting: *Deleted

## 2017-05-28 MED ORDER — DULOXETINE HCL 30 MG PO CPEP: 30.0000 mg | ORAL_CAPSULE | Freq: Every day | ORAL | 1 refills | Status: DC

## 2017-06-06 ENCOUNTER — Other Ambulatory Visit: Payer: Self-pay | Admitting: *Deleted

## 2017-06-06 DIAGNOSIS — C9000 Multiple myeloma not having achieved remission: Secondary | ICD-10-CM

## 2017-06-06 MED ORDER — HYDROMORPHONE HCL 2 MG PO TABS: 2.0000 mg | ORAL_TABLET | Freq: Four times a day (QID) | ORAL | 0 refills | Status: DC | PRN

## 2017-06-27 ENCOUNTER — Other Ambulatory Visit: Payer: Self-pay | Admitting: *Deleted

## 2017-06-27 MED ORDER — FENTANYL 25 MCG/HR TD PT72: 25.0000 ug | MEDICATED_PATCH | TRANSDERMAL | 0 refills | Status: DC

## 2017-06-27 MED ORDER — FENTANYL 100 MCG/HR TD PT72: 100.0000 ug | MEDICATED_PATCH | TRANSDERMAL | 0 refills | Status: DC

## 2017-06-28 ENCOUNTER — Other Ambulatory Visit: Payer: Self-pay | Admitting: *Deleted

## 2017-06-28 DIAGNOSIS — F419 Anxiety disorder, unspecified: Secondary | ICD-10-CM

## 2017-06-28 DIAGNOSIS — C9000 Multiple myeloma not having achieved remission: Secondary | ICD-10-CM

## 2017-06-28 MED ORDER — ALPRAZOLAM 0.5 MG PO TABS: 0.5000 mg | ORAL_TABLET | Freq: Three times a day (TID) | ORAL | 1 refills | Status: DC | PRN

## 2017-07-05 ENCOUNTER — Other Ambulatory Visit: Payer: Self-pay | Admitting: *Deleted

## 2017-07-05 DIAGNOSIS — C9000 Multiple myeloma not having achieved remission: Secondary | ICD-10-CM

## 2017-07-05 MED ORDER — HYDROMORPHONE HCL 2 MG PO TABS: 2.0000 mg | ORAL_TABLET | Freq: Four times a day (QID) | ORAL | 0 refills | Status: DC | PRN

## 2017-07-13 ENCOUNTER — Inpatient Hospital Stay: Payer: Medicaid Other

## 2017-07-16 ENCOUNTER — Inpatient Hospital Stay: Payer: Medicaid Other | Admitting: Internal Medicine

## 2017-07-16 NOTE — Progress Notes (Deleted)
[facility] OFFICE PROGRESS NOTE  Patient Care Team: Petra Kuba, MD as PCP - General (Family Medicine)  Cancer Staging No matching staging information was found for the patient.   Oncology History   # SEP 2015- MULTIPLE MYELOMA [multiple PET pos Bone lesions; hypercalcemia s/p BMBx; FISH- Aneuploidy - gain of chromosome 7,9,15,FGFR3/4p16.3, and CCND1/11q13. Loss of MAF/16q23.1), cytogenetics normal 46XY] s/p Vel-Dex-Rev-Zometa; Excellent PR;   # MARCH 2016- REV-DEX ; FEB 2017-[discn Dex] Rev 25 mg 3 w On & 1 w Off; March 2017- M- protein 0.4gm/dl; K/L= 7.9; cont Rev;   # Revlimid on June 2018 [sec to ONJ]  # March 2017-  Left Middle Lobe cavitary lesion- ~29m- repeat Ct in 3-429m# AUG 2017- ONJ [[name], [place] #  ? ONJ- [phone] not follow up ]- DISCONT Zometa.; NO HBO sec to   # Chronic pain/Anxiety [ortho]  # COPD/smoking/ [[facility]- not candidate for BMT- sec to co-morbidities/poor nutritional status];      [facility]     INTERVAL HISTORY:  [place] [age].o.  male pleasant patient above history of  ROS    PAST MEDICAL HISTORY :  Past Medical History:  Diagnosis Date  . Anxiety   . Arthritis   . Atrial septal defect   . CHF (congestive heart failure) (HC[facility]  . COPD (chronic obstructive pulmonary disease) (HCOak [place]  . Coronary artery disease   . Dysrhythmias   . GERD (gastroesophageal reflux disease)   . Hyperlipidemia   . Multiple myeloma (HCFarrag[place]  . Multiple myeloma (HCRancho Santa Margarita  . Myocardial infarction (HCGarret[facility] [place]  . Substance abuse (HCShung[place]    PAST SURGICAL HISTORY :   Past Surgical History:  Procedure Laterality Date  . CARDIAC DEFIBRILLATOR PLACEMENT      FAMILY HISTORY :   Family History  Problem Relation Age of Onset  . COPD Mother   . CAD Mother   . Heart attack Father   . Prostate cancer Maternal Grandfather   . Kidney cancer Neg Hx   . Bladder Cancer Neg Hx     SOCIAL HISTORY:   Social  History   Tobacco Use  . Smoking status: Current Every Day Smoker    Packs/day: 0.25    Years: 40.00    Pack years: 10.00    Types: Cigarettes  . Smokeless tobacco: Never Used  Substance Use Topics  . Alcohol use: No    Alcohol/week: 0.0 oz  . Drug use: No    ALLERGIES:  has No Known Allergies.  MEDICATIONS:  Current Outpatient Medications  Medication Sig Dispense Refill  . acetaminophen (TYLENOL) 325 MG tablet Take 2 tablets (650 mg total) by mouth every 6 (six) hours as needed for mild pain (or Fever >/= 101). (Patient not taking: Reported on 05/16/2017)    . albuterol (PROVENTIL HFA;VENTOLIN HFA) 108 (90 Base) MCG/ACT inhaler Inhale 2 puffs into the lungs every 6 (six) hours as needed for wheezing or shortness of breath. 1 Inhaler 6  . ALPRAZolam (XANAX) 0.5 MG tablet Take 1 tablet (0.5 mg total) by mouth 3 (three) times daily as needed for anxiety. 90 tablet 1  . aspirin 325 MG EC tablet Take 325 mg by mouth daily.    . Marland Kitchentorvastatin (LIPITOR) 20 MG tablet Take 20 mg by mouth daily.  2  . DULoxetine (CYMBALTA) 30 MG capsule Take 1 capsule (30 mg total) by mouth daily. 90 capsule 1  . feeding  supplement, ENSURE ENLIVE, (ENSURE ENLIVE) LIQD Take 237 mLs by mouth 3 (three) times daily between meals. 90 Bottle 1  . fentaNYL (DURAGESIC - DOSED MCG/HR) 100 MCG/HR Place 1 patch (100 mcg total) onto the skin every other day. Use with a 25 mcg patch for total dose of 125 mcg 15 patch 0  . fentaNYL (DURAGESIC - DOSED MCG/HR) 25 MCG/HR patch Place 1 patch (25 mcg total) onto the skin every other day. Use with a 100 mcg patch for total dose of 125 mcg 15 patch 0  . Fluticasone-Salmeterol (ADVAIR DISKUS) 250-50 MCG/DOSE AEPB Inhale 1 puff into the lungs 2 (two) times daily. 1 each 12  . furosemide (LASIX) 20 MG tablet Take 20 mg by mouth daily.  0  . gabapentin (NEURONTIN) 100 MG capsule TAKE TWO CAPSULES BY MOUTH THREE TIMES DAILY 180 capsule 3  . guaiFENesin-codeine (ROBITUSSIN AC) 100-10  MG/5ML syrup Take 5 mLs by mouth 3 (three) times daily as needed for cough. (Patient not taking: Reported on 05/16/2017) 120 mL 0  . HYDROmorphone (DILAUDID) 2 MG tablet Take 1 tablet (2 mg total) by mouth every 6 (six) hours as needed for severe pain. 120 tablet 0  . ipratropium-albuterol (DUONEB) 0.5-2.5 (3) MG/3ML SOLN Take 3 mLs by nebulization every 4 (four) hours as needed. 360 mL 3  . metoprolol tartrate (LOPRESSOR) 50 MG tablet Take 50 mg by mouth 2 (two) times daily.  2  . ondansetron (ZOFRAN) 4 MG tablet Take 1 tablet (4 mg total) by mouth every 8 (eight) hours as needed for nausea or vomiting. (Patient not taking: Reported on 05/16/2017) 21 tablet 3  . ondansetron (ZOFRAN) 8 MG tablet 1 pill every 8 hours as needed. (Patient not taking: Reported on 05/16/2017) 40 tablet 0  . OXYGEN Inhale 3 L into the lungs daily.    . polyethylene glycol (MIRALAX / GLYCOLAX) packet Take 17 g by mouth daily. (Patient not taking: Reported on 05/16/2017) 30 each 0  . tiotropium (SPIRIVA HANDIHALER) 18 MCG inhalation capsule Place 1 capsule (18 mcg total) into inhaler and inhale daily. 30 capsule 6   No current facility-administered medications for this visit.    Facility-Administered Medications Ordered in Other Visits  Medication Dose Route Frequency Provider Last Rate Last Dose  . 0.9 %  sodium chloride infusion   Intravenous Continuous Cammie Sickle, MD 10 mL/hr at 02/24/15 1520      PHYSICAL EXAMINATION: ECOG PERFORMANCE STATUS: {CHL ONC ECOG [phone]}  There were no vitals taken for this visit.  There were no vitals filed for this visit.  GENERAL: Well-nourished well-developed; Alert, no distress and comfortable.  *** Alone/Accompanied by family.  EYES: no pallor or icterus OROPHARYNX: no thrush or ulceration; NECK: supple; no lymph nodes felt. LYMPH:  no palpable lymphadenopathy in the axillary or inguinal regions LUNGS: Decreased breath sounds auscultation bilaterally. No wheeze  or crackles HEART/CVS: regular rate & rhythm and no murmurs; No lower extremity edema ABDOMEN:abdomen soft, non-tender and normal bowel sounds. No hepatomegaly or splenomegaly.  Musculoskeletal:no cyanosis of digits and no clubbing  PSYCH: alert & oriented x 3 with fluent speech NEURO: no focal motor/sensory deficits SKIN:  no rashes or significant lesions    LABORATORY DATA:  I have reviewed the data as listed    Component Value Date/Time   NA 137 05/14/2017 1515   NA 135 05/12/2014 1343   K 4.1 05/14/2017 1515   K 3.3 (L) 05/12/2014 1343   CL 96 (L) 05/14/2017 1515  CL 101 05/12/2014 1343   CO2 32 05/14/2017 1515   CO2 27 05/12/2014 1343   GLUCOSE 98 05/14/2017 1515   GLUCOSE 77 05/12/2014 1343   BUN 8 05/14/2017 1515   BUN 9 05/12/2014 1343   CREATININE 0.80 05/14/2017 1515   CREATININE 0.89 05/12/2014 1343   CALCIUM 9.3 05/14/2017 1515   CALCIUM 8.8 (L) 05/12/2014 1343   PROT 7.4 05/14/2017 1515   PROT 5.9 (L) 02/17/2014 1405   ALBUMIN 4.1 05/14/2017 1515   ALBUMIN 2.8 (L) 02/17/2014 1405   AST 17 05/14/2017 1515   AST 9 (L) 02/17/2014 1405   ALT 9 (L) 05/14/2017 1515   ALT 12 (L) 02/17/2014 1405   ALKPHOS 45 05/14/2017 1515   ALKPHOS 44 (L) 02/17/2014 1405   BILITOT 0.4 05/14/2017 1515   BILITOT 0.2 02/17/2014 1405   GFRNONAA >60 05/14/2017 1515   GFRNONAA >60 05/12/2014 1343   GFRAA >60 05/14/2017 1515   GFRAA >60 05/12/2014 1343    No results found for: SPEP, UPEP  Lab Results  Component Value Date   WBC 5.4 05/14/2017   NEUTROABS 2.7 05/14/2017   HGB 12.8 (L) 05/14/2017   HCT 38.1 (L) 05/14/2017   MCV 87.9 05/14/2017   PLT 245 05/14/2017      Chemistry      Component Value Date/Time   NA 137 05/14/2017 1515   NA 135 05/12/2014 1343   K 4.1 05/14/2017 1515   K 3.3 (L) 05/12/2014 1343   CL 96 (L) 05/14/2017 1515   CL 101 05/12/2014 1343   CO2 32 05/14/2017 1515   CO2 27 05/12/2014 1343   BUN 8 05/14/2017 1515   BUN 9 05/12/2014 1343    CREATININE 0.80 05/14/2017 1515   CREATININE 0.89 05/12/2014 1343      Component Value Date/Time   CALCIUM 9.3 05/14/2017 1515   CALCIUM 8.8 (L) 05/12/2014 1343   ALKPHOS 45 05/14/2017 1515   ALKPHOS 44 (L) 02/17/2014 1405   AST 17 05/14/2017 1515   AST 9 (L) 02/17/2014 1405   ALT 9 (L) 05/14/2017 1515   ALT 12 (L) 02/17/2014 1405   BILITOT 0.4 05/14/2017 1515   BILITOT 0.2 02/17/2014 1405       RADIOGRAPHIC STUDIES: I have personally reviewed the radiological images as listed and agreed with the findings in the report. No results found.   ASSESSMENT & PLAN:  No problem-specific Assessment & Plan notes found for this encounter.   No orders of the defined types were placed in this encounter.  All questions were answered. The patient knows to call the clinic with any problems, questions or concerns.      Cammie Sickle, MD 07/16/2017 8:23 AM

## 2017-07-16 NOTE — Assessment & Plan Note (Deleted)
#  MULTIPLE MYELOMA-with biochemical recurrence. HOLDING MAINTENANCE REVLIMID- sec ONJ/co-morbidities.   # No concerns for clinical progression noted this time; but biochemical recurrence [# Feb 2019- M protein- 0.5 ; K/L- slightly abnormal.  April 2019 myeloma panel pending. / CBC CMP-unremarkable.]   # However recommend continued holding Revlimid [given wound healing issues/left jaw osteonecrosis of the jaw-C discussion below].    #Also discussed the role of new treatment options like daratumumab along with Revlimid or pomalidomide-patient has clinically significant progression.    #  Osteonecrosis of the jaw- Denosumab discontinued.  # chronic pain/anxiety- on fentanyl [137.74mg] and Dilaudid; Xanax. Continue current pain medications.  #[facility]actorial[facility] patient reluctant/but agrees with referral.   # follow up in 2 months/ cbc/cmp/m-panel labs- 1 week prior.

## 2017-07-20 ENCOUNTER — Telehealth: Payer: Self-pay | Admitting: Internal Medicine

## 2017-07-20 MED ORDER — FLUTICASONE-SALMETEROL 250-50 MCG/DOSE IN AEPB: 1.0000 | INHALATION_SPRAY | Freq: Two times a day (BID) | RESPIRATORY_TRACT | 2 refills | Status: DC

## 2017-07-20 NOTE — Telephone Encounter (Signed)
°*  STAT* If patient is at the pharmacy, call can be transferred to refill team.   1. Which medications need to be refilled? (please list name of each medication and dose if known) fluticasone-Salmeterol (ADVAIR DISKUS) 250-50 MCG/DOSE   2. Which pharmacy/location (including street and city if local pharmacy) is medication to be sent to? [facility]   3. Do they need a 30 day or 90 day supply? 90 day

## 2017-07-20 NOTE — Telephone Encounter (Signed)
rx sent for 90 day supply

## 2017-07-25 ENCOUNTER — Other Ambulatory Visit: Payer: Self-pay | Admitting: *Deleted

## 2017-07-26 MED ORDER — FENTANYL 100 MCG/HR TD PT72: 100.0000 ug | MEDICATED_PATCH | TRANSDERMAL | 0 refills | Status: DC

## 2017-07-26 MED ORDER — FENTANYL 25 MCG/HR TD PT72: 25.0000 ug | MEDICATED_PATCH | TRANSDERMAL | 0 refills | Status: DC

## 2017-07-27 ENCOUNTER — Other Ambulatory Visit: Payer: Medicaid Other

## 2017-07-27 ENCOUNTER — Ambulatory Visit: Payer: Medicaid Other | Admitting: Internal Medicine

## 2017-07-28 ENCOUNTER — Other Ambulatory Visit: Payer: Self-pay | Admitting: Internal Medicine

## 2017-07-28 DIAGNOSIS — C9000 Multiple myeloma not having achieved remission: Secondary | ICD-10-CM

## 2017-08-02 ENCOUNTER — Other Ambulatory Visit: Payer: Self-pay | Admitting: *Deleted

## 2017-08-02 DIAGNOSIS — C9000 Multiple myeloma not having achieved remission: Secondary | ICD-10-CM

## 2017-08-02 MED ORDER — HYDROMORPHONE HCL 2 MG PO TABS: 2.0000 mg | ORAL_TABLET | Freq: Four times a day (QID) | ORAL | 0 refills | Status: DC | PRN

## 2017-08-20 ENCOUNTER — Inpatient Hospital Stay: Payer: Medicaid Other

## 2017-08-20 ENCOUNTER — Inpatient Hospital Stay: Payer: Medicaid Other | Admitting: Internal Medicine

## 2017-08-23 ENCOUNTER — Inpatient Hospital Stay (HOSPITAL_BASED_OUTPATIENT_CLINIC_OR_DEPARTMENT_OTHER): Payer: Medicaid Other | Admitting: Internal Medicine

## 2017-08-23 ENCOUNTER — Other Ambulatory Visit: Payer: Self-pay

## 2017-08-23 ENCOUNTER — Encounter: Payer: Self-pay | Admitting: Internal Medicine

## 2017-08-23 ENCOUNTER — Telehealth: Payer: Self-pay | Admitting: *Deleted

## 2017-08-23 ENCOUNTER — Inpatient Hospital Stay: Payer: Medicaid Other | Attending: Internal Medicine

## 2017-08-23 VITALS — BP 101/71 | HR 79 | Temp 97.9°F | Resp 20

## 2017-08-23 DIAGNOSIS — M87180 Osteonecrosis due to drugs, jaw: Secondary | ICD-10-CM | POA: Insufficient documentation

## 2017-08-23 DIAGNOSIS — T50995D Adverse effect of other drugs, medicaments and biological substances, subsequent encounter: Secondary | ICD-10-CM | POA: Diagnosis not present

## 2017-08-23 DIAGNOSIS — I252 Old myocardial infarction: Secondary | ICD-10-CM | POA: Insufficient documentation

## 2017-08-23 DIAGNOSIS — Z9981 Dependence on supplemental oxygen: Secondary | ICD-10-CM | POA: Diagnosis not present

## 2017-08-23 DIAGNOSIS — F419 Anxiety disorder, unspecified: Secondary | ICD-10-CM | POA: Insufficient documentation

## 2017-08-23 DIAGNOSIS — Z950 Presence of cardiac pacemaker: Secondary | ICD-10-CM | POA: Insufficient documentation

## 2017-08-23 DIAGNOSIS — G8929 Other chronic pain: Secondary | ICD-10-CM | POA: Insufficient documentation

## 2017-08-23 DIAGNOSIS — Z79899 Other long term (current) drug therapy: Secondary | ICD-10-CM | POA: Insufficient documentation

## 2017-08-23 DIAGNOSIS — C9002 Multiple myeloma in relapse: Secondary | ICD-10-CM

## 2017-08-23 DIAGNOSIS — J449 Chronic obstructive pulmonary disease, unspecified: Secondary | ICD-10-CM

## 2017-08-23 DIAGNOSIS — K219 Gastro-esophageal reflux disease without esophagitis: Secondary | ICD-10-CM

## 2017-08-23 DIAGNOSIS — I509 Heart failure, unspecified: Secondary | ICD-10-CM

## 2017-08-23 DIAGNOSIS — F17210 Nicotine dependence, cigarettes, uncomplicated: Secondary | ICD-10-CM | POA: Diagnosis not present

## 2017-08-23 DIAGNOSIS — I2510 Atherosclerotic heart disease of native coronary artery without angina pectoris: Secondary | ICD-10-CM | POA: Diagnosis not present

## 2017-08-23 DIAGNOSIS — F1110 Opioid abuse, uncomplicated: Secondary | ICD-10-CM | POA: Diagnosis not present

## 2017-08-23 DIAGNOSIS — Z7982 Long term (current) use of aspirin: Secondary | ICD-10-CM | POA: Diagnosis not present

## 2017-08-23 DIAGNOSIS — R5382 Chronic fatigue, unspecified: Secondary | ICD-10-CM

## 2017-08-23 DIAGNOSIS — E785 Hyperlipidemia, unspecified: Secondary | ICD-10-CM

## 2017-08-23 DIAGNOSIS — M549 Dorsalgia, unspecified: Secondary | ICD-10-CM | POA: Insufficient documentation

## 2017-08-23 DIAGNOSIS — R1032 Left lower quadrant pain: Secondary | ICD-10-CM | POA: Diagnosis not present

## 2017-08-23 DIAGNOSIS — C9000 Multiple myeloma not having achieved remission: Secondary | ICD-10-CM

## 2017-08-23 DIAGNOSIS — R5381 Other malaise: Secondary | ICD-10-CM | POA: Diagnosis not present

## 2017-08-23 DIAGNOSIS — M25552 Pain in left hip: Secondary | ICD-10-CM | POA: Diagnosis not present

## 2017-08-23 DIAGNOSIS — M1990 Unspecified osteoarthritis, unspecified site: Secondary | ICD-10-CM | POA: Diagnosis not present

## 2017-08-23 LAB — COMPREHENSIVE METABOLIC PANEL
ALT: 8 U/L (ref 0–44)
AST: 16 U/L (ref 15–41)
Albumin: 4.3 g/dL (ref 3.5–5.0)
Alkaline Phosphatase: 43 U/L (ref 38–126)
Anion gap: 12 (ref 5–15)
BILIRUBIN TOTAL: 0.6 mg/dL (ref 0.3–1.2)
BUN: 9 mg/dL (ref 6–20)
CALCIUM: 9.5 mg/dL (ref 8.9–10.3)
CO2: 36 mmol/L — ABNORMAL HIGH (ref 22–32)
Chloride: 92 mmol/L — ABNORMAL LOW (ref 98–111)
Creatinine, Ser: 0.86 mg/dL (ref 0.61–1.24)
Glucose, Bld: 100 mg/dL — ABNORMAL HIGH (ref 70–99)
POTASSIUM: 3.5 mmol/L (ref 3.5–5.1)
Sodium: 140 mmol/L (ref 135–145)
Total Protein: 8.3 g/dL — ABNORMAL HIGH (ref 6.5–8.1)

## 2017-08-23 LAB — CBC WITH DIFFERENTIAL/PLATELET
BASOS PCT: 0 %
Basophils Absolute: 0 10*3/uL (ref 0–0.1)
EOS ABS: 0.3 10*3/uL (ref 0–0.7)
EOS PCT: 5 %
HCT: 42 % (ref 40.0–52.0)
Hemoglobin: 14.1 g/dL (ref 13.0–18.0)
Lymphocytes Relative: 25 %
Lymphs Abs: 1.6 10*3/uL (ref 1.0–3.6)
MCH: 29.8 pg (ref 26.0–34.0)
MCHC: 33.6 g/dL (ref 32.0–36.0)
MCV: 88.5 fL (ref 80.0–100.0)
MONOS PCT: 9 %
Monocytes Absolute: 0.5 10*3/uL (ref 0.2–1.0)
NEUTROS PCT: 61 %
Neutro Abs: 4 10*3/uL (ref 1.4–6.5)
PLATELETS: 209 10*3/uL (ref 150–440)
RBC: 4.75 MIL/uL (ref 4.40–5.90)
RDW: 14.2 % (ref 11.5–14.5)
WBC: 6.5 10*3/uL (ref 3.8–10.6)

## 2017-08-23 MED ORDER — CYPROHEPTADINE HCL 2 MG/5ML PO SYRP: 4.0000 mg | ORAL_SOLUTION | Freq: Two times a day (BID) | ORAL | 2 refills | Status: DC

## 2017-08-23 MED ORDER — ALPRAZOLAM 0.5 MG PO TABS: 0.5000 mg | ORAL_TABLET | Freq: Three times a day (TID) | ORAL | 1 refills | Status: DC | PRN

## 2017-08-23 MED ORDER — HYDROMORPHONE HCL 2 MG PO TABS: 2.0000 mg | ORAL_TABLET | Freq: Four times a day (QID) | ORAL | 0 refills | Status: DC | PRN

## 2017-08-23 MED ORDER — FENTANYL 100 MCG/HR TD PT72: 100.0000 ug | MEDICATED_PATCH | TRANSDERMAL | 0 refills | Status: DC

## 2017-08-23 MED ORDER — FENTANYL 25 MCG/HR TD PT72: 25.0000 ug | MEDICATED_PATCH | TRANSDERMAL | 0 refills | Status: DC

## 2017-08-23 NOTE — Progress Notes (Signed)
[place] OFFICE PROGRESS NOTE  Patient Care Team: Petra Kuba, MD as PCP - General (Family Medicine)  Cancer Staging No matching staging information was found for the patient.   Oncology History   # SEP 2015- MULTIPLE MYELOMA [multiple PET pos Bone lesions; hypercalcemia s/p BMBx; FISH- Aneuploidy - gain of chromosome 7,9,15,FGFR3/4p16.3, and CCND1/11q13. Loss of MAF/16q23.1), cytogenetics normal 46XY] s/p Vel-Dex-Rev-Zometa; Excellent PR;   # MARCH 2016- REV-DEX ; FEB 2017-[discn Dex] Rev 25 mg 3 w On & 1 w Off; March 2017- M- protein 0.4gm/dl; K/L= 7.9; cont Rev;   # Revlimid on June 2018 [sec to ONJ]  # March 2017-  Left Middle Lobe cavitary lesion- ~61m- repeat Ct in 3-429m# AUG 2017- ONJ [[name], [place] #  ? ONJ- [id number] not follow up ]- DISCONT Zometa.; NO HBO sec to   # Chronic pain/Anxiety [ortho]  # COPD/smoking/ [[facility]- not candidate for BMT- sec to co-morbidities/poor nutritional status];      [facility] myel[facility] in relapse ([facility]     INTERVAL HISTORY:  [name] [age].o.  male pleasant patient above history of multiple myeloma currently on surveillance secondary to multiple comorbidities is here for follow-up.  She continues to complain of chronic fatigue.  Also complains of chronic shortness of breath cough.   Patient continues to complain of chronic back pain for which she is on fentanyl patches/Dilaudid.  Has chronic anxiety for which is on Ativan.  Review of Systems  Constitutional: Positive for malaise/fatigue. Negative for chills, diaphoresis, fever and weight loss.  HENT: Negative for nosebleeds and sore throat.   Eyes: Negative for double vision.  Respiratory: Positive for cough, shortness of breath and wheezing. Negative for hemoptysis.   Cardiovascular: Negative for chest pain, palpitations, orthopnea and leg swelling.  Gastrointestinal: Negative for abdominal pain, blood in stool, constipation, diarrhea,  heartburn, melena, nausea and vomiting.  Genitourinary: Negative for dysuria, frequency and urgency.  Musculoskeletal: Positive for back pain and joint pain.  Skin: Negative.  Negative for itching and rash.  Neurological: Negative for dizziness, tingling, focal weakness, weakness and headaches.  Endo/Heme/Allergies: Does not bruise/bleed easily.  Psychiatric/Behavioral: Negative for depression. The patient is not nervous/anxious and does not have insomnia.       PAST MEDICAL HISTORY :  Past Medical History:  Diagnosis Date  . Anxiety   . Arthritis   . Atrial septal defect   . CHF (congestive heart failure) ([facility] [place]  . COPD (chronic obstructive pulmonary disease) ([facility]  . Coronary artery disease   . Dysrhythmias   . GERD (gastroesophageal reflux disease)   . Hyperlipidemia   . Multiple [place]oma ([facility] [place]  . [place]oma ([facility]  . Myocardial infarction ([facility]  . Substance abuse ([facility] [place]    PAST SURGICAL HISTORY :   Past Surgical History:  Procedure Laterality Date  . CARDIAC DEFIBRILLATOR PLACEMENT      FAMILY HISTORY :   Family History  Problem Relation Age of Onset  . COPD Mother   . CAD Mother   . Heart attack Father   . Prostate cancer Maternal Grandfather   . Kidney cancer Neg Hx   . Bladder Cancer Neg Hx     SOCIAL HISTORY:   Social History   Tobacco Use  . Smoking status: Current Every Day Smoker    Packs/day: 0.25    Years: 40.00    Pack years: 10.00    Types: Cigarettes  . Smokeless tobacco:  Never Used  Substance Use Topics  . Alcohol use: No    Alcohol/week: 0.0 standard drinks  . Drug use: No    ALLERGIES:  has No Known Allergies.  MEDICATIONS:  Current Outpatient Medications  Medication Sig Dispense Refill  . acetaminophen (TYLENOL) 325 MG tablet Take 2 tablets (650 mg total) by mouth every 6 (six) hours as needed for mild pain (or Fever >/= 101).    Marland Kitchen albuterol (PROVENTIL HFA;VENTOLIN HFA) 108 (90 Base) MCG/ACT inhaler Inhale 2 puffs into the  lungs every 6 (six) hours as needed for wheezing or shortness of breath. 1 Inhaler 6  . ALPRAZolam (XANAX) 0.5 MG tablet Take 1 tablet (0.5 mg total) by mouth 3 (three) times daily as needed for anxiety. 90 tablet 1  . aspirin 325 MG EC tablet Take 325 mg by mouth daily.    Marland Kitchen atorvastatin (LIPITOR) 20 MG tablet Take 20 mg by mouth daily.  2  . DULoxetine (CYMBALTA) 30 MG capsule Take 1 capsule (30 mg total) by mouth daily. 90 capsule 1  . feeding supplement, ENSURE ENLIVE, (ENSURE ENLIVE) LIQD Take 237 mLs by mouth 3 (three) times daily between meals. 90 Bottle 1  . fentaNYL (DURAGESIC - DOSED MCG/HR) 100 MCG/HR Place 1 patch (100 mcg total) onto the skin every other day. Use with a 25 mcg patch for total dose of 125 mcg 15 patch 0  . fentaNYL (DURAGESIC - DOSED MCG/HR) 25 MCG/HR patch Place 1 patch (25 mcg total) onto the skin every other day. Use with a 100 mcg patch for total dose of 125 mcg 15 patch 0  . Fluticasone-Salmeterol (ADVAIR DISKUS) 250-50 MCG/DOSE AEPB Inhale 1 puff into the lungs 2 (two) times daily. 3 each 2  . furosemide (LASIX) 20 MG tablet Take 20 mg by mouth daily.  0  . gabapentin (NEURONTIN) 100 MG capsule TAKE TWO CAPSULES BY MOUTH THREE TIMES DAILY 180 capsule 3  . HYDROmorphone (DILAUDID) 2 MG tablet Take 1 tablet (2 mg total) by mouth every 6 (six) hours as needed for severe pain. 120 tablet 0  . ipratropium-albuterol (DUONEB) 0.5-2.5 (3) MG/3ML SOLN Take 3 mLs by nebulization every 4 (four) hours as needed. 360 mL 3  . metoprolol tartrate (LOPRESSOR) 50 MG tablet Take 50 mg by mouth 2 (two) times daily.  2  . OXYGEN Inhale 3 L into the lungs daily.    Marland Kitchen tiotropium (SPIRIVA HANDIHALER) 18 MCG inhalation capsule Place 1 capsule (18 mcg total) into inhaler and inhale daily. 30 capsule 6  . cyproheptadine (PERIACTIN) 2 MG/5ML syrup Take 10 mLs (4 mg total) by mouth 2 (two) times daily. 240 mL 2  . guaiFENesin-codeine (ROBITUSSIN AC) 100-10 MG/5ML syrup Take 5 mLs by mouth 3  (three) times daily as needed for cough. (Patient not taking: Reported on 05/16/2017) 120 mL 0  . megestrol (MEGACE) 20 MG tablet Take 1 tablet (20 mg total) by mouth daily. 30 tablet 2  . ondansetron (ZOFRAN) 4 MG tablet Take 1 tablet (4 mg total) by mouth every 8 (eight) hours as needed for nausea or vomiting. (Patient not taking: Reported on 05/16/2017) 21 tablet 3  . ondansetron (ZOFRAN) 8 MG tablet 1 pill every 8 hours as needed. (Patient not taking: Reported on 05/16/2017) 40 tablet 0  . polyethylene glycol (MIRALAX / GLYCOLAX) packet Take 17 g by mouth daily. (Patient not taking: Reported on 05/16/2017) 30 each 0   No current facility-administered medications for this visit.    Facility-Administered Medications  Ordered in Other Visits  Medication Dose Route Frequency Provider Last Rate Last Dose  . 0.9 %  sodium chloride infusion   Intravenous Continuous Cammie Sickle, MD 10 mL/hr at 02/24/15 1520      PHYSICAL EXAMINATION: ECOG PERFORMANCE STATUS: 1 - Symptomatic but completely ambulatory  BP 101/71   Pulse 79   Temp 97.9 F (36.6 C) (Tympanic)   Resp 20   There were no vitals filed for this visit.  GENERAL: Well-nourished well-developed; Alert, no distress and comfortable.  Accompanied by family. EYES: no pallor or icterus OROPHARYNX: no thrush or ulceration; NECK: supple; no lymph nodes felt. LYMPH:  no palpable lymphadenopathy in the axillary or inguinal regions LUNGS: Decreased breath sounds auscultation bilaterally. No wheeze or crackles HEART/CVS: regular rate & rhythm and no murmurs; No lower extremity edema ABDOMEN:abdomen soft, non-tender and normal bowel sounds. No hepatomegaly or splenomegaly.  Musculoskeletal:no cyanosis of digits and no clubbing  PSYCH: alert & oriented x 3 with fluent speech NEURO: no focal motor/sensory deficits SKIN:  no rashes or significant lesions    LABORATORY DATA:  I have reviewed the data as listed    Component Value  Date/Time   NA 140 08/23/2017 0917   NA 135 05/12/2014 1343   K 3.5 08/23/2017 0917   K 3.3 (L) 05/12/2014 1343   CL 92 (L) 08/23/2017 0917   CL 101 05/12/2014 1343   CO2 36 (H) 08/23/2017 0917   CO2 27 05/12/2014 1343   GLUCOSE 100 (H) 08/23/2017 0917   GLUCOSE 77 05/12/2014 1343   BUN 9 08/23/2017 0917   BUN 9 05/12/2014 1343   CREATININE 0.86 08/23/2017 0917   CREATININE 0.89 05/12/2014 1343   CALCIUM 9.5 08/23/2017 0917   CALCIUM 8.8 (L) 05/12/2014 1343   PROT 8.3 (H) 08/23/2017 0917   PROT 5.9 (L) 02/17/2014 1405   ALBUMIN 4.3 08/23/2017 0917   ALBUMIN 2.8 (L) 02/17/2014 1405   AST 16 08/23/2017 0917   AST 9 (L) 02/17/2014 1405   ALT 8 08/23/2017 0917   ALT 12 (L) 02/17/2014 1405   ALKPHOS 43 08/23/2017 0917   ALKPHOS 44 (L) 02/17/2014 1405   BILITOT 0.6 08/23/2017 0917   BILITOT 0.2 02/17/2014 1405   GFRNONAA >60 08/23/2017 0917   GFRNONAA >60 05/12/2014 1343   GFRAA >60 08/23/2017 0917   GFRAA >60 05/12/2014 1343    No results found for: SPEP, UPEP  Lab Results  Component Value Date   WBC 6.5 08/23/2017   NEUTROABS 4.0 08/23/2017   HGB 14.1 08/23/2017   HCT 42.0 08/23/2017   MCV 88.5 08/23/2017   PLT 209 08/23/2017      Chemistry      Component Value Date/Time   NA 140 08/23/2017 0917   NA 135 05/12/2014 1343   K 3.5 08/23/2017 0917   K 3.3 (L) 05/12/2014 1343   CL 92 (L) 08/23/2017 0917   CL 101 05/12/2014 1343   CO2 36 (H) 08/23/2017 0917   CO2 27 05/12/2014 1343   BUN 9 08/23/2017 0917   BUN 9 05/12/2014 1343   CREATININE 0.86 08/23/2017 0917   CREATININE 0.89 05/12/2014 1343      Component Value Date/Time   CALCIUM 9.5 08/23/2017 0917   CALCIUM 8.8 (L) 05/12/2014 1343   ALKPHOS 43 08/23/2017 0917   ALKPHOS 44 (L) 02/17/2014 1405   AST 16 08/23/2017 0917   AST 9 (L) 02/17/2014 1405   ALT 8 08/23/2017 0917   ALT 12 (L) 02/17/2014  1405   BILITOT 0.6 08/23/2017 0917   BILITOT 0.2 02/17/2014 1405       RADIOGRAPHIC STUDIES: I have  personally reviewed the radiological images as listed and agreed with the findings in the report. No results found.   ASSESSMENT & PLAN:  Multiple myeloma in relapse ([place]) # MULTIPLE MYELOMA- HOLDING MAINTENANCE REVLIMID- sec ONJ/co-morbidities.   # No concerns for clinical progression noted this time; but biochemical recurrence.  Labs from today pending.  Hold active treatments at this time given multiple comorbidities.  # weight loss-new? Etiology  peractin 10 ml BD; discussed re: drowsiness.   #  Osteonecrosis of the jaw-stable.  Denosumab discontinued.   # chronic pain/anxiety- on fentanyl [137.14mg] and Dilaudid; Xanax. Continue current pain medications. STABLE.   # follow up in 2 months/ cbc/cmp/m-panel labs- 1 week prior.    Orders Placed This Encounter  Procedures  . CBC with Differential/Platelet    Standing Status:   Future    Standing Expiration Date:   08/24/2018  . Comprehensive metabolic panel    Standing Status:   Future    Standing Expiration Date:   08/24/2018  . Kappa/lambda light chains    Standing Status:   Future    Standing Expiration Date:   08/24/2018  . Multiple Myeloma Panel (SPEP&IFE w/QIG)    Standing Status:   Future    Standing Expiration Date:   08/24/2018   All questions were answered. The patient knows to call the clinic with any problems, questions or concerns.      GCammie Sickle MD 09/02/2017 9:38 PM

## 2017-08-23 NOTE — Telephone Encounter (Signed)
Periactin needs a prior authorization

## 2017-08-23 NOTE — Telephone Encounter (Signed)
Dr. Rogue Bussing -patient has [place] [employer]. This medication not an approved drug to choose from on [place] [facility]. What alternative medication do you want to approve. Looks like the [facility] [employer] would consider megace tablet for front line tx of cachexia/anxoria/appetite stimulator. Megace Suspension being nonpreferred.

## 2017-08-23 NOTE — Assessment & Plan Note (Addendum)
#  MULTIPLE MYELOMA- HOLDING MAINTENANCE REVLIMID- sec ONJ/co-morbidities.   # No concerns for clinical progression noted this time; but biochemical recurrence.  Labs from today pending.  Hold active treatments at this time given multiple comorbidities.  # weight loss-new? Etiology  peractin 10 ml BD; discussed re: drowsiness.   #  Osteonecrosis of the jaw-stable.  Denosumab discontinued.   # chronic pain/anxiety- on fentanyl [137.44mg] and Dilaudid; Xanax. Continue current pain medications. STABLE.   # follow up in 2 months/ cbc/cmp/m-panel labs- 1 week prior.

## 2017-08-24 LAB — KAPPA/LAMBDA LIGHT CHAINS
Kappa free light chain: 590.8 mg/L — ABNORMAL HIGH (ref 3.3–19.4)
Kappa, lambda light chain ratio: 64.22 — ABNORMAL HIGH (ref 0.26–1.65)
Lambda free light chains: 9.2 mg/L (ref 5.7–26.3)

## 2017-08-24 MED ORDER — MEGESTROL ACETATE 20 MG PO TABS: 20.0000 mg | ORAL_TABLET | Freq: Every day | ORAL | 2 refills | Status: DC

## 2017-08-24 NOTE — Telephone Encounter (Signed)
Spoke with dr. Rogue Bussing. V/o to send New script for megace 20 mg tablet daily #30.

## 2017-08-27 LAB — MULTIPLE MYELOMA PANEL, SERUM
ALBUMIN SERPL ELPH-MCNC: 3.8 g/dL (ref 2.9–4.4)
ALPHA 1: 0.3 g/dL (ref 0.0–0.4)
Albumin/Glob SerPl: 1.2 (ref 0.7–1.7)
Alpha2 Glob SerPl Elph-Mcnc: 0.9 g/dL (ref 0.4–1.0)
B-Globulin SerPl Elph-Mcnc: 1 g/dL (ref 0.7–1.3)
GLOBULIN, TOTAL: 3.4 g/dL (ref 2.2–3.9)
Gamma Glob SerPl Elph-Mcnc: 1.2 g/dL (ref 0.4–1.8)
IGM (IMMUNOGLOBULIN M), SRM: 58 mg/dL (ref 20–172)
IgA: 142 mg/dL (ref 90–386)
IgG (Immunoglobin G), Serum: 1364 mg/dL (ref 700–1600)
M Protein SerPl Elph-Mcnc: 0.5 g/dL — ABNORMAL HIGH
TOTAL PROTEIN ELP: 7.2 g/dL (ref 6.0–8.5)

## 2017-08-31 ENCOUNTER — Telehealth: Payer: Self-pay | Admitting: *Deleted

## 2017-08-31 NOTE — Telephone Encounter (Signed)
Dr. Jacinto Reap - please contact patient with results

## 2017-08-31 NOTE — Telephone Encounter (Signed)
Patient saw his lab results in [place] and is very concerned about elevated levels. Please call him @ [phone].       dhs

## 2017-08-31 NOTE — Telephone Encounter (Signed)
Per previous phone call patient is concerned about his abn multiple myeloma lab results.

## 2017-09-02 ENCOUNTER — Telehealth: Payer: Self-pay | Admitting: Internal Medicine

## 2017-09-02 NOTE — Telephone Encounter (Signed)
Please inform patient/family that I would like to talk to patient on August 14 at 2:45 to discuss his treatment options for multiple myeloma given his rising kappa/lamda light chains.

## 2017-09-03 ENCOUNTER — Telehealth: Payer: Self-pay | Admitting: Internal Medicine

## 2017-09-03 NOTE — Telephone Encounter (Signed)
FYI- Spoke to pt re: the results of K/L/M protein. Will plan follow up this week to discuss treatment plan.

## 2017-09-05 ENCOUNTER — Ambulatory Visit: Payer: Medicaid Other | Admitting: Internal Medicine

## 2017-09-06 ENCOUNTER — Other Ambulatory Visit: Payer: Self-pay

## 2017-09-06 ENCOUNTER — Encounter: Payer: Self-pay | Admitting: Internal Medicine

## 2017-09-06 ENCOUNTER — Telehealth: Payer: Self-pay | Admitting: Pharmacist

## 2017-09-06 ENCOUNTER — Inpatient Hospital Stay (HOSPITAL_BASED_OUTPATIENT_CLINIC_OR_DEPARTMENT_OTHER): Payer: Medicaid Other | Admitting: Internal Medicine

## 2017-09-06 DIAGNOSIS — Z79899 Other long term (current) drug therapy: Secondary | ICD-10-CM

## 2017-09-06 DIAGNOSIS — C9002 Multiple myeloma in relapse: Secondary | ICD-10-CM | POA: Diagnosis not present

## 2017-09-06 DIAGNOSIS — R5382 Chronic fatigue, unspecified: Secondary | ICD-10-CM

## 2017-09-06 DIAGNOSIS — G8929 Other chronic pain: Secondary | ICD-10-CM

## 2017-09-06 DIAGNOSIS — J449 Chronic obstructive pulmonary disease, unspecified: Secondary | ICD-10-CM

## 2017-09-06 DIAGNOSIS — F17210 Nicotine dependence, cigarettes, uncomplicated: Secondary | ICD-10-CM

## 2017-09-06 DIAGNOSIS — F419 Anxiety disorder, unspecified: Secondary | ICD-10-CM

## 2017-09-06 DIAGNOSIS — I509 Heart failure, unspecified: Secondary | ICD-10-CM

## 2017-09-06 DIAGNOSIS — M87180 Osteonecrosis due to drugs, jaw: Secondary | ICD-10-CM

## 2017-09-06 DIAGNOSIS — M549 Dorsalgia, unspecified: Secondary | ICD-10-CM

## 2017-09-06 DIAGNOSIS — E785 Hyperlipidemia, unspecified: Secondary | ICD-10-CM

## 2017-09-06 DIAGNOSIS — I252 Old myocardial infarction: Secondary | ICD-10-CM

## 2017-09-06 DIAGNOSIS — Z9981 Dependence on supplemental oxygen: Secondary | ICD-10-CM

## 2017-09-06 DIAGNOSIS — K219 Gastro-esophageal reflux disease without esophagitis: Secondary | ICD-10-CM

## 2017-09-06 DIAGNOSIS — T50995D Adverse effect of other drugs, medicaments and biological substances, subsequent encounter: Secondary | ICD-10-CM

## 2017-09-06 DIAGNOSIS — Z7982 Long term (current) use of aspirin: Secondary | ICD-10-CM

## 2017-09-06 DIAGNOSIS — R5381 Other malaise: Secondary | ICD-10-CM

## 2017-09-06 DIAGNOSIS — I2510 Atherosclerotic heart disease of native coronary artery without angina pectoris: Secondary | ICD-10-CM

## 2017-09-06 MED ORDER — IXAZOMIB CITRATE 4 MG PO CAPS: 4.0000 mg | ORAL_CAPSULE | ORAL | 4 refills | Status: DC

## 2017-09-06 MED ORDER — DEXAMETHASONE 4 MG PO TABS: 12.0000 mg | ORAL_TABLET | ORAL | 4 refills | Status: DC

## 2017-09-06 NOTE — Assessment & Plan Note (Addendum)
#  MULTIPLE MYELOMA- HOLDING MAINTENANCE REVLIMID- sec ONJ/co-morbidities.   # No concerns for clinical progression noted this time; but BIOCHEMICAL recurrence noted.  Patient's kappa light chains 570.;  M protein-stable 0.5 g.  #Discussed the multiple options the patient and his friend-including restarting revlimid [patient concerned of extreme fatigue]; dara/ elo infusions; however Ninlaro seems to be reasonable option for the patient.  Plan to start Ninlaro dexamethasone.  # Discussed the potential side effects of Ninlaro including but not limited to-shingles infections; diarrhea nausea.  will start patient on shingles prophylaxis.  #  Osteonecrosis of the jaw-stable.  Denosumab discontinued.   # chronic pain/anxiety- on fentanyl [137.21mg] and Dilaudid; Xanax. Continue current pain medications. STABLE.  # cbc/bmp-1 m- start ninlaro-dex;

## 2017-09-06 NOTE — Progress Notes (Signed)
[place] OFFICE PROGRESS NOTE  Patient Care Team: Petra Kuba, MD as PCP - General (Family Medicine)  Cancer Staging No matching staging information was found for the patient.   Oncology History   # SEP 2015- MULTIPLE MYELOMA [multiple PET pos Bone lesions; hypercalcemia s/p BMBx; FISH- Aneuploidy - gain of chromosome 7,9,15,FGFR3/4p16.3, and CCND1/11q13. Loss of MAF/16q23.1), cytogenetics normal 46XY] s/p Vel-Dex-Rev-Zometa; Excellent PR;   # MARCH 2016- REV-DEX ; FEB 2017-[discn Dex] Rev 25 mg 3 w On & 1 w Off; March 2017- M- protein 0.4gm/dl; K/L= 7.9; cont Rev;   # Revlimid HELD June 2018 [sec to ONJ]  # AUG 2019- RECURRENCE- NINLARO-DEX  # March 2017-  Left Middle Lobe cavitary lesion- ~11m- repeat Ct in 3-4100m# AUG 2017- ONJ [[name], [place] #  ? ONJ- [phone] not follow up ]- DISCONT Zometa.; NO HBO sec to   # Chronic pain/Anxiety [ortho]  # COPD/smoking/ [[facility]- not candidate for BMT- sec to co-morbidities/poor nutritional status];   --------------------------------------------------------------------   DIAGNOSIS: MULTIPLE MYELOMA  STAGE: RECURRENT  ;GOALS: CONTROL  CURRENT/MOST RECENT THERAPY; AUG 2019- NINLARO-DEX      Multiple myeloma in relapse ([facility]     INTERVAL HISTORY:  Jah A Yaeger [age].[id number].  male pleasant patient above history of multiple myeloma currently on surveillance/off maintenance because of multiple comorbidities is here to discuss treatment options given his elevated kappa light chains.  Patient continues to feel overall poorly.  Chronic shortness of breath chronic cough.  Unfortunately continues smoke.  Possible weight loss.  Chronic back pain joint pains for which is a narcotic pain medication.  Review of Systems  Constitutional: Positive for malaise/fatigue and weight loss. Negative for chills, diaphoresis and fever.  HENT: Negative for nosebleeds and sore throat.   Eyes: Negative for double vision.   Respiratory: Positive for cough and shortness of breath. Negative for hemoptysis, sputum production and wheezing.   Cardiovascular: Negative for chest pain, palpitations, orthopnea and leg swelling.  Gastrointestinal: Negative for abdominal pain, blood in stool, constipation, diarrhea, heartburn, melena, nausea and vomiting.  Genitourinary: Negative for dysuria, frequency and urgency.  Musculoskeletal: Positive for back pain and joint pain.  Skin: Negative.  Negative for itching and rash.  Neurological: Negative for dizziness, tingling, focal weakness, weakness and headaches.  Endo/Heme/Allergies: Does not bruise/bleed easily.  Psychiatric/Behavioral: Negative for depression. The patient is not nervous/anxious and does not have insomnia.       PAST MEDICAL HISTORY :  Past Medical History:  Diagnosis Date  . Anxiety   . Arthritis   . Atrial septal defect   . CHF (congestive heart failure) ([facility]  . COPD (chronic obstructive pulmonary disease) ([facility]  . Coronary artery disease   . Dysrhythmias   . GERD (gastroesophageal reflux disease)   . Hyperlipidemia   . Multiple myeloma (H[facility]  . Multiple myeloma ([facility]  . Myocardial infarction ([facility]  . Substance abuse ([facility]    PAST SURGICAL HISTORY :   Past Surgical History:  Procedure Laterality Date  . CARDIAC DEFIBRILLATOR PLACEMENT      FAMILY HISTORY :   Family History  Problem Relation Age of Onset  . COPD Mother   . CAD Mother   . Heart attack Father   . Prostate cancer Maternal Grandfather   . Kidney cancer Neg Hx   . Bladder Cancer Neg Hx     SOCIAL HISTORY:   Social History   Tobacco Use  .  Smoking status: Current Every Day Smoker    Packs/day: 0.25    Years: 40.00    Pack years: 10.00    Types: Cigarettes  . Smokeless tobacco: Never Used  Substance Use Topics  . Alcohol use: No    Alcohol/week: 0.0 standard drinks  . Drug use: No    ALLERGIES:  has No Known Allergies.  MEDICATIONS:  Current  Outpatient Medications  Medication Sig Dispense Refill  . acetaminophen (TYLENOL) 325 MG tablet Take 2 tablets (650 mg total) by mouth every 6 (six) hours as needed for mild pain (or Fever >/= 101).    Marland Kitchen albuterol (PROVENTIL HFA;VENTOLIN HFA) 108 (90 Base) MCG/ACT inhaler Inhale 2 puffs into the lungs every 6 (six) hours as needed for wheezing or shortness of breath. 1 Inhaler 6  . ALPRAZolam (XANAX) 0.5 MG tablet Take 1 tablet (0.5 mg total) by mouth 3 (three) times daily as needed for anxiety. 90 tablet 1  . aspirin 325 MG EC tablet Take 325 mg by mouth daily.    Marland Kitchen atorvastatin (LIPITOR) 20 MG tablet Take 20 mg by mouth daily.  2  . cyproheptadine (PERIACTIN) 2 MG/5ML syrup Take 10 mLs (4 mg total) by mouth 2 (two) times daily. 240 mL 2  . DULoxetine (CYMBALTA) 30 MG capsule Take 1 capsule (30 mg total) by mouth daily. 90 capsule 1  . feeding supplement, ENSURE ENLIVE, (ENSURE ENLIVE) LIQD Take 237 mLs by mouth 3 (three) times daily between meals. 90 Bottle 1  . fentaNYL (DURAGESIC - DOSED MCG/HR) 100 MCG/HR Place 1 patch (100 mcg total) onto the skin every other day. Use with a 25 mcg patch for total dose of 125 mcg 15 patch 0  . fentaNYL (DURAGESIC - DOSED MCG/HR) 25 MCG/HR patch Place 1 patch (25 mcg total) onto the skin every other day. Use with a 100 mcg patch for total dose of 125 mcg 15 patch 0  . Fluticasone-Salmeterol (ADVAIR DISKUS) 250-50 MCG/DOSE AEPB Inhale 1 puff into the lungs 2 (two) times daily. 3 each 2  . furosemide (LASIX) 20 MG tablet Take 20 mg by mouth daily.  0  . gabapentin (NEURONTIN) 100 MG capsule TAKE TWO CAPSULES BY MOUTH THREE TIMES DAILY 180 capsule 3  . HYDROmorphone (DILAUDID) 2 MG tablet Take 1 tablet (2 mg total) by mouth every 6 (six) hours as needed for severe pain. 120 tablet 0  . ipratropium-albuterol (DUONEB) 0.5-2.5 (3) MG/3ML SOLN Take 3 mLs by nebulization every 4 (four) hours as needed. 360 mL 3  . megestrol (MEGACE) 20 MG tablet Take 1 tablet (20 mg  total) by mouth daily. 30 tablet 2  . metoprolol tartrate (LOPRESSOR) 50 MG tablet Take 50 mg by mouth 2 (two) times daily.  2  . OXYGEN Inhale 3 L into the lungs daily.    Marland Kitchen tiotropium (SPIRIVA HANDIHALER) 18 MCG inhalation capsule Place 1 capsule (18 mcg total) into inhaler and inhale daily. 30 capsule 6  . dexamethasone (DECADRON) 4 MG tablet Take 3 tablets (12 mg total) by mouth once a week. Take with breakfast 12 tablet 4  . guaiFENesin-codeine (ROBITUSSIN AC) 100-10 MG/5ML syrup Take 5 mLs by mouth 3 (three) times daily as needed for cough. (Patient not taking: Reported on 05/16/2017) 120 mL 0  . ixazomib citrate (NINLARO) 4 MG capsule Take 1 capsule (4 mg total) by mouth once a week. Take for 3 weeks on, then 1 week off. Take on empty stomach 1hr before or 2hrs after food. 3  capsule 4  . ondansetron (ZOFRAN) 4 MG tablet Take 1 tablet (4 mg total) by mouth every 8 (eight) hours as needed for nausea or vomiting. (Patient not taking: Reported on 05/16/2017) 21 tablet 3  . ondansetron (ZOFRAN) 8 MG tablet 1 pill every 8 hours as needed. (Patient not taking: Reported on 05/16/2017) 40 tablet 0  . polyethylene glycol (MIRALAX / GLYCOLAX) packet Take 17 g by mouth daily. (Patient not taking: Reported on 05/16/2017) 30 each 0   No current facility-administered medications for this visit.    Facility-Administered Medications Ordered in Other Visits  Medication Dose Route Frequency Provider Last Rate Last Dose  . 0.9 %  sodium chloride infusion   Intravenous Continuous Cammie Sickle, MD 10 mL/hr at 02/24/15 1520      PHYSICAL EXAMINATION: ECOG PERFORMANCE STATUS: 2 - Symptomatic, <50% confined to bed  BP 103/70 (BP Location: Left Arm, Patient Position: Sitting)   Pulse 82   Temp (!) 97 F (36.1 C) (Tympanic)   Resp 18   Ht 5' 11"  (1.803 m)   Wt 137 lb 6.4 oz (62.3 kg)   BMI 19.16 kg/m   Filed Weights   09/06/17 0911  Weight: 137 lb 6.4 oz (62.3 kg)    Physical Exam   Constitutional: He is oriented to person, place, and time.  Cachectic appearing Caucasian male patient; 2 L nasal cannula.  Accompanied by friend.  HENT:  Head: Normocephalic and atraumatic.  Mouth/Throat: Oropharynx is clear and moist. No oropharyngeal exudate.  Eyes: Pupils are equal, round, and reactive to light.  Neck: Normal range of motion. Neck supple.  Cardiovascular: Normal rate and regular rhythm.  Pulmonary/Chest: No respiratory distress. He has no wheezes.  Bilateral decreased air entry.  Abdominal: Soft. Bowel sounds are normal. He exhibits no distension and no mass. There is no tenderness. There is no rebound and no guarding.  Musculoskeletal: Normal range of motion. He exhibits no edema or tenderness.  Neurological: He is alert and oriented to person, place, and time.  Skin: Skin is warm.  Psychiatric: Affect normal.       LABORATORY DATA:  I have reviewed the data as listed    Component Value Date/Time   NA 140 08/23/2017 0917   NA 135 05/12/2014 1343   K 3.5 08/23/2017 0917   K 3.3 (L) 05/12/2014 1343   CL 92 (L) 08/23/2017 0917   CL 101 05/12/2014 1343   CO2 36 (H) 08/23/2017 0917   CO2 27 05/12/2014 1343   GLUCOSE 100 (H) 08/23/2017 0917   GLUCOSE 77 05/12/2014 1343   BUN 9 08/23/2017 0917   BUN 9 05/12/2014 1343   CREATININE 0.86 08/23/2017 0917   CREATININE 0.89 05/12/2014 1343   CALCIUM 9.5 08/23/2017 0917   CALCIUM 8.8 (L) 05/12/2014 1343   PROT 8.3 (H) 08/23/2017 0917   PROT 5.9 (L) 02/17/2014 1405   ALBUMIN 4.3 08/23/2017 0917   ALBUMIN 2.8 (L) 02/17/2014 1405   AST 16 08/23/2017 0917   AST 9 (L) 02/17/2014 1405   ALT 8 08/23/2017 0917   ALT 12 (L) 02/17/2014 1405   ALKPHOS 43 08/23/2017 0917   ALKPHOS 44 (L) 02/17/2014 1405   BILITOT 0.6 08/23/2017 0917   BILITOT 0.2 02/17/2014 1405   GFRNONAA >60 08/23/2017 0917   GFRNONAA >60 05/12/2014 1343   GFRAA >60 08/23/2017 0917   GFRAA >60 05/12/2014 1343    No results found for: SPEP,  UPEP  Lab Results  Component Value Date  WBC 6.5 08/23/2017   NEUTROABS 4.0 08/23/2017   HGB 14.1 08/23/2017   HCT 42.0 08/23/2017   MCV 88.5 08/23/2017   PLT 209 08/23/2017      Chemistry      Component Value Date/Time   NA 140 08/23/2017 0917   NA 135 05/12/2014 1343   K 3.5 08/23/2017 0917   K 3.3 (L) 05/12/2014 1343   CL 92 (L) 08/23/2017 0917   CL 101 05/12/2014 1343   CO2 36 (H) 08/23/2017 0917   CO2 27 05/12/2014 1343   BUN 9 08/23/2017 0917   BUN 9 05/12/2014 1343   CREATININE 0.86 08/23/2017 0917   CREATININE 0.89 05/12/2014 1343      Component Value Date/Time   CALCIUM 9.5 08/23/2017 0917   CALCIUM 8.8 (L) 05/12/2014 1343   ALKPHOS 43 08/23/2017 0917   ALKPHOS 44 (L) 02/17/2014 1405   AST 16 08/23/2017 0917   AST 9 (L) 02/17/2014 1405   ALT 8 08/23/2017 0917   ALT 12 (L) 02/17/2014 1405   BILITOT 0.6 08/23/2017 0917   BILITOT 0.2 02/17/2014 1405       RADIOGRAPHIC STUDIES: I have personally reviewed the radiological images as listed and agreed with the findings in the report. No results found.   ASSESSMENT & PLAN:  Multiple myeloma in relapse ([place]) # MULTIPLE MYELOMA- HOLDING MAINTENANCE REVLIMID- sec ONJ/co-morbidities.   # No concerns for clinical progression noted this time; but BIOCHEMICAL recurrence noted.  Patient's kappa light chains 570.;  M protein-stable 0.5 g.  #Discussed the multiple options the patient and his friend-including restarting revlimid [patient concerned of extreme fatigue]; dara/ elo infusions; however Ninlaro seems to be reasonable option for the patient.  Plan to start Ninlaro dexamethasone.  # Discussed the potential side effects of Ninlaro including but not limited to-shingles infections; diarrhea nausea.  will start patient on shingles prophylaxis.  #  Osteonecrosis of the jaw-stable.  Denosumab discontinued.   # chronic pain/anxiety- on fentanyl [137.74mg] and Dilaudid; Xanax. Continue current pain medications.  STABLE.  # cbc/bmp-1 m- start ninlaro-dex;    Orders Placed This Encounter  Procedures  . CBC with Differential/Platelet    Standing Status:   Future    Standing Expiration Date:   09/07/2018  . Basic metabolic panel    Standing Status:   Future    Standing Expiration Date:   09/07/2018   All questions were answered. The patient knows to call the clinic with any problems, questions or concerns.      GCammie Sickle MD 09/11/2017 9:57 PM

## 2017-09-06 NOTE — Telephone Encounter (Signed)
Oral Oncology Pharmacist Encounter  Received new prescription for Ninlaro (ixazomib) for the treatment of relapsed multiple myeloma in conjunction with dexamethasone, planned duration until disease progression or unacceptable drug toxicity.   CBC/ CMP from 08/23/17 assessed, no relevant lab abnormalities. Prescription dose and frequency assessed.   Current medication list in Epic reviewed, no DDIs with Ninlaro identified.  Prescription has been e-scribed to the [facility] for benefits analysis and approval.  Patient education Counseled patient and his girlfriend following his OV on administration, dosing, side effects, monitoring, drug-food interactions, safe handling, storage, and disposal.  Patient will take 1 capsule (4 mg total) by mouth once a week. 3 weeks- ON & 1 week-OFF. Take on an empty stomach 1hr before or 2hrs after food. He will also take dexamethasone 3 tablets (12 mg total) by mouth once a week. Take with breakfast.  Side effects include but not limited to: diarrhea, constipation, decreased wbc/plt, N/V.    Reviewed with patient importance of keeping a medication schedule and plan for any missed doses.  Mr Knappenberger voiced understanding and appreciation. All questions answered. Medication handout provided and consent obtained.  Oral Oncology Clinic will continue to follow for insurance authorization, copayment issues, and start date.  Provided patient with [facility] [place] [facility] phone number. Patient knows to call the office with questions or concerns. Oral Chemotherapy Navigation Clinic will continue to follow.  Darl Pikes, PharmD, BCPS, [facility] Hematology/Oncology Clinical Pharmacist [facility] [phone]  09/06/2017 10:34 AM

## 2017-09-10 MED FILL — NINLARO 4 MG CAP: 4 | 28 days supply | Qty: 3 | Fill #0

## 2017-09-10 MED FILL — DEXAMETHASONE 4 MG TABLET: 4 | 28 days supply | Qty: 12 | Fill #0

## 2017-09-11 ENCOUNTER — Telehealth: Payer: Self-pay | Admitting: Internal Medicine

## 2017-09-11 NOTE — Telephone Encounter (Signed)
Allison-please inform patient that he can start his Ninlaro-dexamethasone when available to him; and follow-up with me as planned on September 12. Thx

## 2017-09-12 NOTE — Telephone Encounter (Addendum)
Oral Chemotherapy Pharmacist Encounter   Spoke with Mr. Claw and his Ninlaro/dexamethasone was delivered yesterday. He took his first dose of dexamethasone this morning 09/12/17 with his breakfast and plans on taking his Ninlaro this evening. Reviewed with Mr. Macgowan that he should take his Ninlaro on an empty stomach.   Also, reviewed the dosing schedule with Mr. Coulthard and reminded him that his next dose after today will be next Wednesday. I will make a calendar for him and put it in the mail.   Darl Pikes, PharmD, BCPS, [facility] Hematology/Oncology Clinical Pharmacist [facility] [phone]  09/12/2017 1:28 PM

## 2017-09-17 ENCOUNTER — Telehealth: Payer: Self-pay | Admitting: *Deleted

## 2017-09-17 ENCOUNTER — Ambulatory Visit
Admission: RE | Admit: 2017-09-17 | Discharge: 2017-09-17 | Disposition: A | Discharge status: Home / Self Care | Payer: Medicaid Other | Source: Ambulatory Visit | Attending: Oncology | Admitting: Oncology

## 2017-09-17 ENCOUNTER — Encounter: Payer: Self-pay | Admitting: Oncology

## 2017-09-17 ENCOUNTER — Inpatient Hospital Stay (HOSPITAL_BASED_OUTPATIENT_CLINIC_OR_DEPARTMENT_OTHER): Payer: Medicaid Other | Admitting: Oncology

## 2017-09-17 VITALS — BP 106/74 | HR 111 | Temp 98.4°F | Resp 18

## 2017-09-17 DIAGNOSIS — I509 Heart failure, unspecified: Secondary | ICD-10-CM

## 2017-09-17 DIAGNOSIS — F17210 Nicotine dependence, cigarettes, uncomplicated: Secondary | ICD-10-CM

## 2017-09-17 DIAGNOSIS — C9002 Multiple myeloma in relapse: Secondary | ICD-10-CM | POA: Insufficient documentation

## 2017-09-17 DIAGNOSIS — R1032 Left lower quadrant pain: Secondary | ICD-10-CM

## 2017-09-17 DIAGNOSIS — G8929 Other chronic pain: Secondary | ICD-10-CM

## 2017-09-17 DIAGNOSIS — M8588 Other specified disorders of bone density and structure, other site: Secondary | ICD-10-CM | POA: Diagnosis not present

## 2017-09-17 DIAGNOSIS — I2510 Atherosclerotic heart disease of native coronary artery without angina pectoris: Secondary | ICD-10-CM

## 2017-09-17 DIAGNOSIS — Z9981 Dependence on supplemental oxygen: Secondary | ICD-10-CM

## 2017-09-17 DIAGNOSIS — T50995D Adverse effect of other drugs, medicaments and biological substances, subsequent encounter: Secondary | ICD-10-CM

## 2017-09-17 DIAGNOSIS — C9000 Multiple myeloma not having achieved remission: Secondary | ICD-10-CM

## 2017-09-17 DIAGNOSIS — F1110 Opioid abuse, uncomplicated: Secondary | ICD-10-CM | POA: Diagnosis not present

## 2017-09-17 DIAGNOSIS — M8448XA Pathological fracture, other site, initial encounter for fracture: Secondary | ICD-10-CM | POA: Insufficient documentation

## 2017-09-17 DIAGNOSIS — I252 Old myocardial infarction: Secondary | ICD-10-CM

## 2017-09-17 DIAGNOSIS — M87180 Osteonecrosis due to drugs, jaw: Secondary | ICD-10-CM

## 2017-09-17 DIAGNOSIS — E785 Hyperlipidemia, unspecified: Secondary | ICD-10-CM

## 2017-09-17 DIAGNOSIS — F419 Anxiety disorder, unspecified: Secondary | ICD-10-CM

## 2017-09-17 DIAGNOSIS — M25552 Pain in left hip: Secondary | ICD-10-CM | POA: Diagnosis not present

## 2017-09-17 DIAGNOSIS — R5381 Other malaise: Secondary | ICD-10-CM

## 2017-09-17 DIAGNOSIS — M549 Dorsalgia, unspecified: Secondary | ICD-10-CM | POA: Diagnosis not present

## 2017-09-17 DIAGNOSIS — R5382 Chronic fatigue, unspecified: Secondary | ICD-10-CM

## 2017-09-17 DIAGNOSIS — J449 Chronic obstructive pulmonary disease, unspecified: Secondary | ICD-10-CM

## 2017-09-17 DIAGNOSIS — Z79899 Other long term (current) drug therapy: Secondary | ICD-10-CM

## 2017-09-17 DIAGNOSIS — Z7982 Long term (current) use of aspirin: Secondary | ICD-10-CM

## 2017-09-17 DIAGNOSIS — K219 Gastro-esophageal reflux disease without esophagitis: Secondary | ICD-10-CM

## 2017-09-17 MED ORDER — HYDROMORPHONE HCL 2 MG PO TABS: 2.0000 mg | ORAL_TABLET | Freq: Four times a day (QID) | ORAL | 0 refills | Status: DC | PRN

## 2017-09-17 NOTE — Telephone Encounter (Signed)
Daughter in law called and reports that patient is in extreme pain in his hip and back and has been doubling up on his medications and is still hurting. Asking what to do for him.  Appointment accepted for 130 this afternoon

## 2017-09-17 NOTE — Progress Notes (Signed)
Symptom Management Consult note [facility]  [phone] [phone]  Patient Care Team: Petra Kuba, MD as PCP - General (Family Medicine)   Name of the patient: [name]  [id number]  1960-07-20   Date of visit: 09/18/17  Diagnosis-multiple myeloma  Chief complaint/ Reason for visit-pain  Heme/Onc history: Patient was last seen by primary medical oncologist Dr. Rogue Bussing on 09/06/2017 for follow-up and discussion of possible biochemical recurrence given elevated kappa light chains. No signs of clinical progression. It was decided to begin Ninlaro with dexamethasone.  He complained of feeling poorly overall, chronic shortness of breath, weight loss and chronic back/joint pains for which he continued his narcotic pain medication.  Oncology History   # SEP 2015- MULTIPLE MYELOMA [multiple PET pos Bone lesions; hypercalcemia s/p BMBx; FISH- Aneuploidy - gain of chromosome 7,9,15,FGFR3/4p16.3, and CCND1/11q13. Loss of MAF/16q23.1), cytogenetics normal 46XY] s/p Vel-Dex-Rev-Zometa; Excellent PR;   # MARCH 2016- REV-DEX ; FEB 2017-[discn Dex] Rev 25 mg 3 w On & 1 w Off; March 2017- M- protein 0.4gm/dl; K/L= 7.9; cont Rev;   # Revlimid HELD June 2018 [sec to [facility]]  # AUG 2019- RECURRENCE- NINLARO-DEX  # March 2017-  Left Middle Lobe cavitary lesion- ~86m- repeat Ct in 3-435m# AUG 2017- [facility] [[name], [place] #  ? [facility]- [id number] not follow up ]- DISCONT Zometa.; NO HBO sec to   # Chronic pain/Anxiety [ortho]  # COPD/smoking/ [[facility]- not candidate for BMT- sec to co-morbidities/poor nutritional status];   --------------------------------------------------------------------   DIAGNOSIS: MULTIPLE MYELOMA  STAGE: RECURRENT  ;GOALS: CONTROL  CURRENT/MOST RECENT THERAPY; AUG 2019- NINLARO-DEX      [place] [place]   Interval history-   Patient complains of abdominal and left hip pain. The pain is  located in the LLQ and left hip. The pain is described as aching, cramping and stabbing, and is 10/10 in intensity. Onset was 2 weeks ago. Symptoms have been rapidly worsening since. Aggravating factors include activity and sitting for extended periods of time.  Alleviating factors include pain medications. Associated symptoms include none. The patient denies anorexia, diarrhea, fever, nausea and vomiting.  Patient requiring significantly more pain medication than Dr. BrRogue Bussingrescribed.  He has been taking 4 mg Dilaudid every 6 hours for pain instead of 2 mg Dilaudid every 6 hours as prescribed.  He also continues fentanyl patch 125 mics per hour.  He denies any injury to left hip, back or abdomen.  Denies any heavy lifting or strenuous activities.   ECOG FS:2 - Symptomatic, <50% confined to bed  Review of systems- Review of Systems  Constitutional: Positive for malaise/fatigue. Negative for chills, fever and weight loss.  HENT: Negative for congestion and ear pain.   Eyes: Negative.  Negative for blurred vision and double vision.  Respiratory: Positive for shortness of breath (o2 dependent). Negative for cough and sputum production.   Cardiovascular: Negative.  Negative for chest pain, palpitations and leg swelling.  Gastrointestinal: Negative.  Negative for abdominal pain, constipation, diarrhea, nausea and vomiting.  Genitourinary: Negative for dysuria, frequency and urgency.  Musculoskeletal: Positive for back pain and joint pain. Negative for falls.  Skin: Negative.  Negative for rash.  Neurological: Negative.  Negative for weakness and headaches.  Endo/Heme/Allergies: Negative.  Does not bruise/bleed easily.  Psychiatric/Behavioral: Negative.  Negative for depression. The patient is not nervous/anxious and does not have insomnia.      Current treatment- Ninlaro and Dex  No  Known Allergies   Past Medical History:  Diagnosis Date  . Anxiety   . Arthritis   . Atrial septal defect     . CHF (congestive heart failure) ([place])   . COPD (chronic obstructive pulmonary disease) ([place])   . Coronary artery disease   . Dysrhythmias   . GERD (gastroesophageal reflux disease)   . Hyperlipidemia   . Multiple myeloma ([place])   . Multiple myeloma ([place])   . Myocardial infarction ([place])   . Substance abuse [facility])      Past Surgical History:  Procedure Laterality Date  . CARDIAC DEFIBRILLATOR PLACEMENT      Social History   Socioeconomic History  . Marital status: Divorced    Spouse name: Not on file  . Number of children: Not on file  . Years of education: Not on file  . Highest education level: Not on file  Occupational History  . Not on file  Social Needs  . Financial resource strain: Not on file  . Food insecurity:    Worry: Not on file    Inability: Not on file  . Transportation needs:    Medical: Not on file    Non-medical: Not on file  Tobacco Use  . Smoking status: Current Every Day Smoker    Packs/day: 0.25    Years: 40.00    Pack years: 10.00    Types: Cigarettes  . Smokeless tobacco: Never Used  Substance and Sexual Activity  . Alcohol use: No    Alcohol/week: 0.0 standard drinks  . Drug use: No  . Sexual activity: Not on file  Lifestyle  . Physical activity:    Days per week: Not on file    Minutes per session: Not on file  . Stress: Not on file  Relationships  . Social connections:    Talks on phone: Not on file    Gets together: Not on file    Attends religious service: Not on file    Active member of club or organization: Not on file    Attends meetings of clubs or organizations: Not on file    Relationship status: Not on file  . Intimate partner violence:    Fear of current or ex partner: Not on file    Emotionally abused: Not on file    Physically abused: Not on file    Forced sexual activity: Not on file  Other Topics Concern  . Not on file  Social History Narrative  . Not on file    Family History  Problem Relation Age of  Onset  . COPD Mother   . CAD Mother   . Heart attack Father   . Prostate cancer Maternal Grandfather   . Kidney cancer Neg Hx   . Bladder Cancer Neg Hx      Current Outpatient Medications:  .  acetaminophen (TYLENOL) 325 MG tablet, Take 2 tablets (650 mg total) by mouth every 6 (six) hours as needed for mild pain (or Fever >/= 101)., Disp: , Rfl:  .  albuterol (PROVENTIL HFA;VENTOLIN HFA) 108 (90 Base) MCG/ACT inhaler, Inhale 2 puffs into the lungs every 6 (six) hours as needed for wheezing or shortness of breath., Disp: 1 Inhaler, Rfl: 6 .  ALPRAZolam (XANAX) 0.5 MG tablet, Take 1 tablet (0.5 mg total) by mouth 3 (three) times daily as needed for anxiety., Disp: 90 tablet, Rfl: 1 .  aspirin 325 MG EC tablet, Take 325 mg by mouth daily., Disp: , Rfl:  .  atorvastatin (LIPITOR)  20 MG tablet, Take 20 mg by mouth daily., Disp: , Rfl: 2 .  dexamethasone (DECADRON) 4 MG tablet, Take 3 tablets (12 mg total) by mouth once a week. Take with breakfast, Disp: 12 tablet, Rfl: 4 .  DULoxetine (CYMBALTA) 30 MG capsule, Take 1 capsule (30 mg total) by mouth daily., Disp: 90 capsule, Rfl: 1 .  feeding supplement, ENSURE ENLIVE, (ENSURE ENLIVE) LIQD, Take 237 mLs by mouth 3 (three) times daily between meals., Disp: 90 Bottle, Rfl: 1 .  fentaNYL (DURAGESIC - DOSED MCG/HR) 100 MCG/HR, Place 1 patch (100 mcg total) onto the skin every other day. Use with a 25 mcg patch for total dose of 125 mcg, Disp: 15 patch, Rfl: 0 .  fentaNYL (DURAGESIC - DOSED MCG/HR) 25 MCG/HR patch, Place 1 patch (25 mcg total) onto the skin every other day. Use with a 100 mcg patch for total dose of 125 mcg, Disp: 15 patch, Rfl: 0 .  Fluticasone-Salmeterol (ADVAIR DISKUS) 250-50 MCG/DOSE AEPB, Inhale 1 puff into the lungs 2 (two) times daily., Disp: 3 each, Rfl: 2 .  furosemide (LASIX) 20 MG tablet, Take 20 mg by mouth daily., Disp: , Rfl: 0 .  gabapentin (NEURONTIN) 100 MG capsule, TAKE TWO CAPSULES BY MOUTH THREE TIMES DAILY, Disp: 180  capsule, Rfl: 3 .  HYDROmorphone (DILAUDID) 2 MG tablet, Take 1 tablet (2 mg total) by mouth every 6 (six) hours as needed for severe pain., Disp: 24 tablet, Rfl: 0 .  ipratropium-albuterol (DUONEB) 0.5-2.5 (3) MG/3ML SOLN, Take 3 mLs by nebulization every 4 (four) hours as needed., Disp: 360 mL, Rfl: 3 .  ixazomib citrate (NINLARO) 4 MG capsule, Take 1 capsule (4 mg total) by mouth once a week. Take for 3 weeks on, then 1 week off. Take on empty stomach 1hr before or 2hrs after food., Disp: 3 capsule, Rfl: 4 .  megestrol (MEGACE) 20 MG tablet, Take 1 tablet (20 mg total) by mouth daily., Disp: 30 tablet, Rfl: 2 .  metoprolol tartrate (LOPRESSOR) 50 MG tablet, Take 50 mg by mouth 2 (two) times daily., Disp: , Rfl: 2 .  OXYGEN, Inhale 3 L into the lungs daily., Disp: , Rfl:  .  cyproheptadine (PERIACTIN) 2 MG/5ML syrup, Take 10 mLs (4 mg total) by mouth 2 (two) times daily. (Patient not taking: Reported on 09/17/2017), Disp: 240 mL, Rfl: 2 .  ondansetron (ZOFRAN) 4 MG tablet, Take 1 tablet (4 mg total) by mouth every 8 (eight) hours as needed for nausea or vomiting. (Patient not taking: Reported on 05/16/2017), Disp: 21 tablet, Rfl: 3 .  ondansetron (ZOFRAN) 8 MG tablet, 1 pill every 8 hours as needed. (Patient not taking: Reported on 05/16/2017), Disp: 40 tablet, Rfl: 0 .  polyethylene glycol (MIRALAX / GLYCOLAX) packet, Take 17 g by mouth daily. (Patient not taking: Reported on 05/16/2017), Disp: 30 each, Rfl: 0 .  tiotropium (SPIRIVA HANDIHALER) 18 MCG inhalation capsule, Place 1 capsule (18 mcg total) into inhaler and inhale daily., Disp: 30 capsule, Rfl: 6 No current facility-administered medications for this visit.   Facility-Administered Medications Ordered in Other Visits:  .  0.9 %  sodium chloride infusion, , Intravenous, Continuous, Charlaine Dalton R, MD, Last Rate: 10 mL/hr at 02/24/15 1520  Physical exam:  Vitals:   09/17/17 1352  BP: 106/74  Pulse: (!) 111  Resp: 18  Temp: 98.4 F  (36.9 C)  TempSrc: Oral  SpO2: 99%   Physical Exam  Constitutional: He is oriented to person, place, and time.  Vital signs are normal. He appears well-developed.  Frail man sitting in wheelchair; oxygen dependent.  On 3 L O2.  HENT:  Head: Normocephalic and atraumatic.  Eyes: Pupils are equal, round, and reactive to light.  Neck: Normal range of motion.  Cardiovascular: Normal rate, regular rhythm and normal heart sounds.  No murmur heard. Pulmonary/Chest: Effort normal and breath sounds normal. He has no wheezes.  Abdominal: Soft. Normal appearance and bowel sounds are normal. He exhibits no distension. There is tenderness in the left lower quadrant.  Musculoskeletal: He exhibits no edema.       Left hip: He exhibits decreased strength and tenderness.       Lumbar back: He exhibits decreased range of motion and tenderness.  Neurological: He is alert and oriented to person, place, and time.  Skin: Skin is warm and dry. No rash noted.  Psychiatric: Judgment normal.     CMP Latest Ref Rng & Units 08/23/2017  Glucose 70 - 99 mg/dL 100(H)  BUN 6 - 20 mg/dL 9  Creatinine 0.61 - 1.24 mg/dL 0.86  Sodium 135 - 145 mmol/L 140  Potassium 3.5 - 5.1 mmol/L 3.5  Chloride 98 - 111 mmol/L 92(L)  CO2 22 - 32 mmol/L 36(H)  Calcium 8.9 - 10.3 mg/dL 9.5  Total Protein 6.5 - 8.1 g/dL 8.3(H)  Total Bilirubin 0.3 - 1.2 mg/dL 0.6  Alkaline Phos 38 - 126 U/L 43  AST 15 - 41 U/L 16  ALT 0 - 44 U/L 8   CBC Latest Ref Rng & Units 08/23/2017  WBC 3.8 - 10.6 K/uL 6.5  Hemoglobin 13.0 - 18.0 g/dL 14.1  Hematocrit 40.0 - 52.0 % 42.0  Platelets 150 - 440 K/uL 209    No images are attached to the encounter.  Dg Bone Survey Met  Result Date: 09/17/2017 CLINICAL DATA:  57 year old male with multiple myeloma. Left hip and pelvic pain. EXAM: METASTATIC BONE SURVEY COMPARISON:  Radiographic bone survey 10/03/2013. CT Abdomen and Pelvis 05/23/2016. Outside thoracolumbar spine x-ray 06/07/2015. FINDINGS:  Stable radiographic appearance of the skull since 2015 with scattered small round lucent or lytic lesions. Normal prevertebral soft tissue contour. Increased osteopenia in the cervical spine but stable cervical vertebral height and alignment with preserved lordosis. Mild disc space loss since 2015. Cervicothoracic junction alignment is within normal limits. Normal thoracic segmentation demonstrated in 2015. T12-L1 and L2 compression fractures are new since 2015 but stable since 2018. The remaining lumbar levels appear stable and intact. A chronic T9 compression fracture appears stable since 2017. A T6 compression fracture is new since 2015 although the compressed inferior endplate is sclerotic. The T1 through T5 levels appear to remain intact. Somewhat large lung volumes and mediastinal contours are stable since 2015. Chronic left chest cardiac AICD. The lungs appear stable in clear. Negative visible bowel gas pattern. Pelvis bone mineralization appears stable since 2015. The femoral heads remain normally located and hip joint spaces are preserved. No pelvis fracture or lytic lesion identified. Multiple small lytic lesions in the proximal left femur appears stable since 2015. No left femoral pathologic fracture identified. Small new lytic lesions in the proximal right femoral shaft since 2015. The right femur remains intact. Increased bilateral proximal humerus round and oval lytic lesions since 2015. No acute fracture identified about either shoulder. No distal upper or lower extremity lytic lesion. IMPRESSION: 1. No pelvic or left hip fracture. Pelvic osteopenia and small proximal left femur multiple myeloma lytic lesions appears stable since 2015. 2. Progressed multiple myeloma  involvement in the spine, bilateral proximal humeri, and proximal right femur since 2015. 3. Age indeterminate T6 compression fracture but other pathologic spinal compression fractures in the thoracic and lumbar segments are stable since  2017. Electronically Signed   By: Genevie Ann M.D.   On: 09/17/2017 16:30     Assessment and plan- Patient is a 57 y.o. male biochemical recurrence of multiple myeloma presents for acute onset left hip radiating to lumbar spine and left quadrant pain. Recently started on Ninlaro and dexamethasone.   1.  Multiple myeloma: Diagnosed in September 2015.  Status post several lines of therapy.  Most recently Revlimid and dexamethasone.  Last given in June 2018.  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  Found to have a biochemical recurrence in August 2019. Several different lines of therapy were discussed.  Recently started on Ninlaro and dexamethasone last week.  Tolerating well so far.  Scheduled to return to clinic on 10/04/2017 after first cycle of Ninlaro/dexamethasone.  2.  Acute pain: Pain not controlled with current pain medication.  Has been on same pain medication dosing for years.  Beginning approximately 2 weeks ago, he experience worsening left pelvic and left hip pain extending into thoracic spine.  Has not had recent scans.  Given biochemical recurrence, will get bone survey to evaluate.  Bone survey did not reveal any pelvic or hip fractures.  Noted pelvic osteopenia small proximal left femoral multiple myeloma lytic lesions that appeared stable since 2015.  It showed progressed multiple myeloma involvement in the spine, bilateral proximal humeri proximal right femur. Revealed a  New (age indeterminate)T6 compression fracture.  As far as pain is concerned, spoke with Dr. Rogue Bussing and is unacceptable that patient has been taking more than prescribed dosing of Dilaudid.  He essentially has been doubling his dose from 2 mg to 4 mg every 6 hours as needed for pain.  He admits to chewing his pills for quicker pain relief.  Today, he is asking for a refill of his pain medication.  After reviewing narcotic registry, it appears he was prescribed 120 tablets of Dilaudid 2 mg tablets on 08/23/2017.  He  filled prescription on 08/31/2017 and is apparently out of tablets.  Prescription was to last him for 30 days.  Given his fill date of 08/31/2017 he should not need a refill until 09/28/2017.  Spoke at length with patient and daughter-in-law regarding misuse of this medication.  Consulted Dr. Rogue Bussing and will refill his original medication dose of 2 mg every 6 hours for 5 days until he is reevaluated by Dr. Chrys Racer to review his bone scan.  Patient also asked to provide a urine sample for a UDS, but states "I am shaking so much because I am straining to try to pee".  Were unable to collect a sample in clinic.  Patient must provide a urine sample prior to his appointment on Friday before any additional pain medications will be prescribed.    Discussed alternative methods for pain control including therapeutic exercises including low impact exercises to focus on improving flexibility, stability and range of motion has shown promise in improving pain and function.  Physical therapy has been the potential to improve symptom control and functioning particularly when musculoskeletal pathology contributes to the pain or dysfunction.  Hydrotherapy is also found to be beneficial for some disorders with pain and impairment such as rheumatoid arthritis, ankylosing spondylitis and fibromyalgia.  Hydrotherapy provides a reduced gravity environment and sensory experience that may augment muscle relaxation,  improve emotional state and facilitate physical therapy due to the effect of buoyancy and decrease stress on muscles.  Alternatives may include orthosis, immobilizers, splints for wrist or ankle for weakness or pain related neuropathy.  Therapeutic application of heat or cold, ultrasound and electrical nerve stimulation can be useful as adjuncts for mild to moderate pain and while awaiting the effects of breakthrough analgesics.  Visit Diagnosis 1. Multiple myeloma in relapse ([place])   2. Multiple myeloma not having  achieved remission [facility])     Patient expressed understanding and was in agreement with this plan. He also understands that He can call clinic at any time with any questions, concerns, or complaints.   Greater than 50% was spent in counseling and coordination of care with this patient including but not limited to discussion of the relevant topics above (See A&P) including, but not limited to diagnosis and management of acute and chronic medical conditions.    Faythe Casa, AGNP-C [facility] at [place]- [phone] Pager- [phone] 09/18/2017 11:08 AM

## 2017-09-21 ENCOUNTER — Inpatient Hospital Stay: Payer: Medicaid Other

## 2017-09-21 ENCOUNTER — Encounter: Payer: Self-pay | Admitting: Internal Medicine

## 2017-09-21 ENCOUNTER — Inpatient Hospital Stay (HOSPITAL_BASED_OUTPATIENT_CLINIC_OR_DEPARTMENT_OTHER): Payer: Medicaid Other | Admitting: Internal Medicine

## 2017-09-21 ENCOUNTER — Other Ambulatory Visit: Payer: Self-pay

## 2017-09-21 VITALS — BP 95/65 | HR 87 | Temp 97.6°F | Resp 16 | Wt 137.8 lb

## 2017-09-21 DIAGNOSIS — C9000 Multiple myeloma not having achieved remission: Secondary | ICD-10-CM

## 2017-09-21 DIAGNOSIS — R5381 Other malaise: Secondary | ICD-10-CM

## 2017-09-21 DIAGNOSIS — Z9981 Dependence on supplemental oxygen: Secondary | ICD-10-CM

## 2017-09-21 DIAGNOSIS — R5383 Other fatigue: Secondary | ICD-10-CM | POA: Diagnosis not present

## 2017-09-21 DIAGNOSIS — F419 Anxiety disorder, unspecified: Secondary | ICD-10-CM

## 2017-09-21 DIAGNOSIS — C9002 Multiple myeloma in relapse: Secondary | ICD-10-CM

## 2017-09-21 DIAGNOSIS — Z79899 Other long term (current) drug therapy: Secondary | ICD-10-CM

## 2017-09-21 DIAGNOSIS — G8929 Other chronic pain: Secondary | ICD-10-CM | POA: Diagnosis not present

## 2017-09-21 DIAGNOSIS — M549 Dorsalgia, unspecified: Secondary | ICD-10-CM

## 2017-09-21 DIAGNOSIS — Z950 Presence of cardiac pacemaker: Secondary | ICD-10-CM

## 2017-09-21 DIAGNOSIS — F17210 Nicotine dependence, cigarettes, uncomplicated: Secondary | ICD-10-CM

## 2017-09-21 DIAGNOSIS — Z7982 Long term (current) use of aspirin: Secondary | ICD-10-CM

## 2017-09-21 LAB — CBC WITH DIFFERENTIAL/PLATELET
BASOS ABS: 0 10*3/uL (ref 0–0.1)
Basophils Relative: 0 %
EOS PCT: 4 %
Eosinophils Absolute: 0.4 10*3/uL (ref 0–0.7)
HCT: 38.9 % — ABNORMAL LOW (ref 40.0–52.0)
Hemoglobin: 13 g/dL (ref 13.0–18.0)
LYMPHS ABS: 2.8 10*3/uL (ref 1.0–3.6)
LYMPHS PCT: 29 %
MCH: 29.7 pg (ref 26.0–34.0)
MCHC: 33.4 g/dL (ref 32.0–36.0)
MCV: 89 fL (ref 80.0–100.0)
Monocytes Absolute: 0.8 10*3/uL (ref 0.2–1.0)
Monocytes Relative: 8 %
NEUTROS PCT: 59 %
Neutro Abs: 5.7 10*3/uL (ref 1.4–6.5)
PLATELETS: 192 10*3/uL (ref 150–440)
RBC: 4.37 MIL/uL — ABNORMAL LOW (ref 4.40–5.90)
RDW: 14.2 % (ref 11.5–14.5)
WBC: 9.7 10*3/uL (ref 3.8–10.6)

## 2017-09-21 LAB — COMPREHENSIVE METABOLIC PANEL
ALT: 10 U/L (ref 0–44)
AST: 16 U/L (ref 15–41)
Albumin: 4 g/dL (ref 3.5–5.0)
Alkaline Phosphatase: 32 U/L — ABNORMAL LOW (ref 38–126)
Anion gap: 7 (ref 5–15)
BUN: 11 mg/dL (ref 6–20)
CHLORIDE: 105 mmol/L (ref 98–111)
CO2: 30 mmol/L (ref 22–32)
Calcium: 9.1 mg/dL (ref 8.9–10.3)
Creatinine, Ser: 0.78 mg/dL (ref 0.61–1.24)
GFR calc Af Amer: >60 mL/min (ref >60)
Glucose, Bld: 114 mg/dL — ABNORMAL HIGH (ref 70–99)
POTASSIUM: 3.3 mmol/L — AB (ref 3.5–5.1)
Sodium: 142 mmol/L (ref 135–145)
Total Bilirubin: 0.3 mg/dL (ref 0.3–1.2)
Total Protein: 7.2 g/dL (ref 6.5–8.1)

## 2017-09-21 MED ORDER — FENTANYL 25 MCG/HR TD PT72: 25.0000 ug | MEDICATED_PATCH | TRANSDERMAL | 0 refills | Status: DC

## 2017-09-21 MED ORDER — HYDROMORPHONE HCL 2 MG PO TABS: 2.0000 mg | ORAL_TABLET | ORAL | 0 refills | Status: DC | PRN

## 2017-09-21 MED ORDER — FENTANYL 100 MCG/HR TD PT72: 100.0000 ug | MEDICATED_PATCH | TRANSDERMAL | 0 refills | Status: DC

## 2017-09-21 NOTE — Assessment & Plan Note (Addendum)
#  MULTIPLE MYELOMA-IgG kappa; kappa light chains 570/elevated.  Bone survey shows progressive lesions in the bilateral humeri/femur.  However pain is mostly in the back.  Would recommend a PET scan for further evaluation.  Patient cannot have MRI because of pacemaker.  # Continue Ninlaro dexamethasone-for now.  However if significant lesions noted on PET scan-possibility for radiation; and also more aggressive therapy.  Patient not a candidate for stem cell transplant.  #  Osteonecrosis of the jaw-stable.  Denosumab discontinued.   # chronic pain/anxiety-WORSE- on fentanyl [137.49mg] and Dilaudid increase dose to 2 mg every 4 hours- new script sent Xanax.  Await above PET scan. Will also refer to pain clinic.   # PET asap; follow up as planned.

## 2017-09-21 NOTE — Progress Notes (Signed)
[facility] OFFICE PROGRESS NOTE  Patient Care Team: Petra Kuba, MD as PCP - General (Family Medicine)  Cancer Staging No matching staging information was found for the patient.   Oncology History   # SEP 2015- MULTIPLE MYELOMA [multiple PET pos Bone lesions; hypercalcemia s/p BMBx; FISH- Aneuploidy - gain of chromosome 7,9,15,FGFR3/4p16.3, and CCND1/11q13. Loss of MAF/16q23.1), cytogenetics normal 46XY] s/p Vel-Dex-Rev-Zometa; Excellent PR;   # MARCH 2016- REV-DEX ; FEB 2017-[discn Dex] Rev 25 mg 3 w On & 1 w Off; March 2017- M- protein 0.4gm/dl; K/L= 7.9; cont Rev;   # Revlimid HELD June 2018 [sec to [facility]]  # AUG 2019- RECURRENCE- NINLARO-DEX  # March 2017-  Left Middle Lobe cavitary lesion- ~22m- repeat Ct in 3-481m# AUG 2017- [facility] [[name], [place] #  ? ONJ- [phone] [id number] not follow up ]- DISCONT Zometa.; NO HBO sec to   # Chronic pain/Anxiety [ortho]  # COPD/smoking/ [[facility]- not candidate for BMT- sec to co-morbidities/poor nutritional status];   --------------------------------------------------------------------   DIAGNOSIS: MULTIPLE MYELOMA  STAGE: RECURRENT  ;GOALS: CONTROL  CURRENT/MOST RECENT THERAPY; AUG 2019- NINLARO-DEX      [place]     INTERVAL HISTORY:  Jerone A Haste [age].o.  male pleasant patient above history of recurrent/relapse multiple myeloma-currently on Ninlaro started approximately 3 weeks ago is here for follow-up given his worsening pain.  Patient complains of " pain all over"-however on further questioning points of pain mostly his back/middle to lower back.  No radiation.  He states his current dose of narcotic pain medication is not helping.  Unfortunately continues to smoke.  Complains of chronic shortness of breath chronic cough.  Review of Systems  Constitutional: Positive for malaise/fatigue and weight loss. Negative for chills, diaphoresis and fever.  HENT: Negative for nosebleeds  and sore throat.   Eyes: Negative for double vision.  Respiratory: Positive for cough and shortness of breath. Negative for hemoptysis, sputum production and wheezing.   Cardiovascular: Negative for chest pain, palpitations, orthopnea and leg swelling.  Gastrointestinal: Negative for abdominal pain, blood in stool, constipation, diarrhea, heartburn, melena, nausea and vomiting.  Genitourinary: Negative for dysuria, frequency and urgency.  Musculoskeletal: Positive for back pain (worse) and joint pain.  Skin: Negative.  Negative for itching and rash.  Neurological: Negative for dizziness, tingling, focal weakness, weakness and headaches.  Endo/Heme/Allergies: Does not bruise/bleed easily.  Psychiatric/Behavioral: Negative for depression. The patient is not nervous/anxious and does not have insomnia.       PAST MEDICAL HISTORY :  Past Medical History:  Diagnosis Date  . Anxiety   . Arthritis   . Atrial septal defect   . CHF (congestive heart failure) ([place]  . COPD (chronic obstructive pulmonary disease) ([facility]  . Coronary artery disease   . Dysrhythmias   . GERD (gastroesophageal reflux disease)   . Hyperlipidemia   . Multiple myeloma ([facility]  . Multiple myeloma ([facility]  . Myocardial infarction ([facility]  . Substance abuse ([facility]    PAST SURGICAL HISTORY :   Past Surgical History:  Procedure Laterality Date  . CARDIAC DEFIBRILLATOR PLACEMENT      FAMILY HISTORY :   Family History  Problem Relation Age of Onset  . COPD Mother   . CAD Mother   . Heart attack Father   . Prostate cancer Maternal Grandfather   . Kidney cancer Neg Hx   . Bladder Cancer Neg Hx  SOCIAL HISTORY:   Social History   Tobacco Use  . Smoking status: Current Every Day Smoker    Packs/day: 0.25    Years: 40.00    Pack years: 10.00    Types: Cigarettes  . Smokeless tobacco: Never Used  Substance Use Topics  . Alcohol use: No    Alcohol/week: 0.0 standard drinks  . Drug use: No     ALLERGIES:  has No Known Allergies.  MEDICATIONS:  Current Outpatient Medications  Medication Sig Dispense Refill  . acetaminophen (TYLENOL) 325 MG tablet Take 2 tablets (650 mg total) by mouth every 6 (six) hours as needed for mild pain (or Fever >/= 101).    Marland Kitchen albuterol (PROVENTIL HFA;VENTOLIN HFA) 108 (90 Base) MCG/ACT inhaler Inhale 2 puffs into the lungs every 6 (six) hours as needed for wheezing or shortness of breath. 1 Inhaler 6  . ALPRAZolam (XANAX) 0.5 MG tablet Take 1 tablet (0.5 mg total) by mouth 3 (three) times daily as needed for anxiety. 90 tablet 1  . aspirin 325 MG EC tablet Take 325 mg by mouth daily.    Marland Kitchen atorvastatin (LIPITOR) 20 MG tablet Take 20 mg by mouth daily.  2  . dexamethasone (DECADRON) 4 MG tablet Take 3 tablets (12 mg total) by mouth once a week. Take with breakfast 12 tablet 4  . DULoxetine (CYMBALTA) 30 MG capsule Take 1 capsule (30 mg total) by mouth daily. 90 capsule 1  . feeding supplement, ENSURE ENLIVE, (ENSURE ENLIVE) LIQD Take 237 mLs by mouth 3 (three) times daily between meals. 90 Bottle 1  . fentaNYL (DURAGESIC - DOSED MCG/HR) 100 MCG/HR Place 1 patch (100 mcg total) onto the skin every other day. Use with a 25 mcg patch for total dose of 125 mcg 15 patch 0  . fentaNYL (DURAGESIC - DOSED MCG/HR) 25 MCG/HR patch Place 1 patch (25 mcg total) onto the skin every other day. Use with a 100 mcg patch for total dose of 125 mcg 15 patch 0  . Fluticasone-Salmeterol (ADVAIR DISKUS) 250-50 MCG/DOSE AEPB Inhale 1 puff into the lungs 2 (two) times daily. 3 each 2  . furosemide (LASIX) 20 MG tablet Take 20 mg by mouth daily.  0  . gabapentin (NEURONTIN) 100 MG capsule TAKE TWO CAPSULES BY MOUTH THREE TIMES DAILY 180 capsule 3  . HYDROmorphone (DILAUDID) 2 MG tablet Take 1 tablet (2 mg total) by mouth every 4 (four) hours as needed for severe pain. 84 tablet 0  . ipratropium-albuterol (DUONEB) 0.5-2.5 (3) MG/3ML SOLN Take 3 mLs by nebulization every 4 (four)  hours as needed. 360 mL 3  . ixazomib citrate (NINLARO) 4 MG capsule Take 1 capsule (4 mg total) by mouth once a week. Take for 3 weeks on, then 1 week off. Take on empty stomach 1hr before or 2hrs after food. 3 capsule 4  . megestrol (MEGACE) 20 MG tablet Take 1 tablet (20 mg total) by mouth daily. 30 tablet 2  . metoprolol tartrate (LOPRESSOR) 50 MG tablet Take 50 mg by mouth 2 (two) times daily.  2  . ondansetron (ZOFRAN) 4 MG tablet Take 1 tablet (4 mg total) by mouth every 8 (eight) hours as needed for nausea or vomiting. 21 tablet 3  . ondansetron (ZOFRAN) 8 MG tablet 1 pill every 8 hours as needed. 40 tablet 0  . OXYGEN Inhale 3 L into the lungs daily.    . polyethylene glycol (MIRALAX / GLYCOLAX) packet Take 17 g by mouth daily. 30 each  0  . tiotropium (SPIRIVA HANDIHALER) 18 MCG inhalation capsule Place 1 capsule (18 mcg total) into inhaler and inhale daily. 30 capsule 6  . cyproheptadine (PERIACTIN) 2 MG/5ML syrup Take 10 mLs (4 mg total) by mouth 2 (two) times daily. (Patient not taking: Reported on 09/17/2017) 240 mL 2   No current facility-administered medications for this visit.    Facility-Administered Medications Ordered in Other Visits  Medication Dose Route Frequency Provider Last Rate Last Dose  . 0.9 %  sodium chloride infusion   Intravenous Continuous Cammie Sickle, MD 10 mL/hr at 02/24/15 1520      PHYSICAL EXAMINATION: ECOG PERFORMANCE STATUS: 2 - Symptomatic, <50% confined to bed  BP 95/65 (BP Location: Left Arm, Patient Position: Sitting)   Pulse 87   Temp 97.6 F (36.4 C) (Tympanic)   Resp 16   Wt 137 lb 12.8 oz (62.5 kg)   BMI 19.22 kg/m   Filed Weights   09/21/17 1423  Weight: 137 lb 12.8 oz (62.5 kg)    Physical Exam  Constitutional: He is oriented to person, place, and time.  Cachectic appearing Caucasian male patient; 2 L nasal cannula.  Accompanied by friend.  HENT:  Head: Normocephalic and atraumatic.  Mouth/Throat: Oropharynx is clear  and moist. No oropharyngeal exudate.  Eyes: Pupils are equal, round, and reactive to light.  Neck: Normal range of motion. Neck supple.  Cardiovascular: Normal rate and regular rhythm.  Pulmonary/Chest: No respiratory distress. He has no wheezes.  Bilateral decreased air entry.  Abdominal: Soft. Bowel sounds are normal. He exhibits no distension and no mass. There is no tenderness. There is no rebound and no guarding.  Musculoskeletal: Normal range of motion. He exhibits no edema or tenderness.  Neurological: He is alert and oriented to person, place, and time.  Skin: Skin is warm.  Psychiatric: Affect normal.       LABORATORY DATA:  I have reviewed the data as listed    Component Value Date/Time   NA 142 09/21/2017 1358   NA 135 05/12/2014 1343   K 3.3 (L) 09/21/2017 1358   K 3.3 (L) 05/12/2014 1343   CL 105 09/21/2017 1358   CL 101 05/12/2014 1343   CO2 30 09/21/2017 1358   CO2 27 05/12/2014 1343   GLUCOSE 114 (H) 09/21/2017 1358   GLUCOSE 77 05/12/2014 1343   BUN 11 09/21/2017 1358   BUN 9 05/12/2014 1343   CREATININE 0.78 09/21/2017 1358   CREATININE 0.89 05/12/2014 1343   CALCIUM 9.1 09/21/2017 1358   CALCIUM 8.8 (L) 05/12/2014 1343   PROT 7.2 09/21/2017 1358   PROT 5.9 (L) 02/17/2014 1405   ALBUMIN 4.0 09/21/2017 1358   ALBUMIN 2.8 (L) 02/17/2014 1405   AST 16 09/21/2017 1358   AST 9 (L) 02/17/2014 1405   ALT 10 09/21/2017 1358   ALT 12 (L) 02/17/2014 1405   ALKPHOS 32 (L) 09/21/2017 1358   ALKPHOS 44 (L) 02/17/2014 1405   BILITOT 0.3 09/21/2017 1358   BILITOT 0.2 02/17/2014 1405   GFRNONAA >60 09/21/2017 1358   GFRNONAA >60 05/12/2014 1343   GFRAA >60 09/21/2017 1358   GFRAA >60 05/12/2014 1343    No results found for: SPEP, UPEP  Lab Results  Component Value Date   WBC 9.7 09/21/2017   NEUTROABS 5.7 09/21/2017   HGB 13.0 09/21/2017   HCT 38.9 (L) 09/21/2017   MCV 89.0 09/21/2017   PLT 192 09/21/2017      Chemistry  Component Value  Date/Time   NA 142 09/21/2017 1358   NA 135 05/12/2014 1343   K 3.3 (L) 09/21/2017 1358   K 3.3 (L) 05/12/2014 1343   CL 105 09/21/2017 1358   CL 101 05/12/2014 1343   CO2 30 09/21/2017 1358   CO2 27 05/12/2014 1343   BUN 11 09/21/2017 1358   BUN 9 05/12/2014 1343   CREATININE 0.78 09/21/2017 1358   CREATININE 0.89 05/12/2014 1343      Component Value Date/Time   CALCIUM 9.1 09/21/2017 1358   CALCIUM 8.8 (L) 05/12/2014 1343   ALKPHOS 32 (L) 09/21/2017 1358   ALKPHOS 44 (L) 02/17/2014 1405   AST 16 09/21/2017 1358   AST 9 (L) 02/17/2014 1405   ALT 10 09/21/2017 1358   ALT 12 (L) 02/17/2014 1405   BILITOT 0.3 09/21/2017 1358   BILITOT 0.2 02/17/2014 1405       RADIOGRAPHIC STUDIES: I have personally reviewed the radiological images as listed and agreed with the findings in the report. No results found.   ASSESSMENT & PLAN:  Multiple myeloma in relapse ([place]) # MULTIPLE MYELOMA-IgG kappa; kappa light chains 570/elevated.  Bone survey shows progressive lesions in the bilateral humeri/femur.  However pain is mostly in the back.  Would recommend a PET scan for further evaluation.  Patient cannot have MRI because of pacemaker.  # Continue Ninlaro dexamethasone-for now.  However if significant lesions noted on PET scan-possibility for radiation; and also more aggressive therapy.  Patient not a candidate for stem cell transplant.  #  Osteonecrosis of the jaw-stable.  Denosumab discontinued.   # chronic pain/anxiety-WORSE- on fentanyl [137.70mg] and Dilaudid increase dose to 2 mg every 4 hours- new script sent Xanax.  Await above PET scan. Will also refer to pain clinic.   # PET asap; follow up as planned.    Orders Placed This Encounter  Procedures  . NM PET Image Restage (PS) Whole Body    Standing Status:   Future    Standing Expiration Date:   09/21/2018    Order Specific Question:   ** REASON FOR EXAM (FREE TEXT)    Answer:   multiple myeloma- worsening pain    Order  Specific Question:   If indicated for the ordered procedure, I authorize the administration of a radiopharmaceutical per Radiology protocol    Answer:   Yes    Order Specific Question:   Preferred imaging location?    Answer:   [facility]    Order Specific Question:   Radiology Contrast Protocol - do NOT remove file path    Answer:   [email or web address]  . Ambulatory referral to Pain Clinic    Referral Priority:   Routine    Referral Type:   Consultation    Referral Reason:   Specialty Services Required    Referred to Provider:   LGillis Santa MD    Requested Specialty:   Pain Medicine    Number of Visits Requested:   1   All questions were answered. The patient knows to call the clinic with any problems, questions or concerns.      GCammie Sickle MD 09/25/2017 4:30 PM

## 2017-09-27 ENCOUNTER — Ambulatory Visit: Payer: Medicaid Other

## 2017-10-01 MED FILL — NINLARO 4 MG CAP: 4 | 28 days supply | Qty: 3 | Fill #1

## 2017-10-03 ENCOUNTER — Ambulatory Visit: Payer: Medicaid Other | Attending: Internal Medicine

## 2017-10-04 ENCOUNTER — Inpatient Hospital Stay: Payer: Medicaid Other | Attending: Internal Medicine

## 2017-10-04 ENCOUNTER — Inpatient Hospital Stay: Payer: Medicaid Other | Admitting: Internal Medicine

## 2017-10-04 ENCOUNTER — Other Ambulatory Visit: Payer: Self-pay | Admitting: *Deleted

## 2017-10-04 DIAGNOSIS — C9000 Multiple myeloma not having achieved remission: Secondary | ICD-10-CM

## 2017-10-04 MED ORDER — HYDROMORPHONE HCL 2 MG PO TABS: 2.0000 mg | ORAL_TABLET | ORAL | 0 refills | Status: DC | PRN

## 2017-10-04 MED FILL — DEXAMETHASONE 4 MG TABLET: 4 | 28 days supply | Qty: 12 | Fill #1

## 2017-10-04 NOTE — Telephone Encounter (Signed)
Patient's family member called to request refill for Dilaudid and to reschedule PET scan and MD appointment secondary to car trouble.

## 2017-10-04 NOTE — Telephone Encounter (Signed)
Dilaudid - pended for md signature.

## 2017-10-04 NOTE — Telephone Encounter (Signed)
Colette, please contact patient to r/s the pet scan and md apt.

## 2017-10-08 NOTE — Telephone Encounter (Signed)
Dr. Jacinto Reap. - per colette, the pt wants to wait a week before re-scheduling pet scan. He wants to get car fixed. He will call next Monday to set it up apts for pet

## 2017-10-18 ENCOUNTER — Other Ambulatory Visit: Payer: Self-pay | Admitting: Oncology

## 2017-10-18 ENCOUNTER — Other Ambulatory Visit: Payer: Self-pay | Admitting: *Deleted

## 2017-10-18 DIAGNOSIS — C9000 Multiple myeloma not having achieved remission: Secondary | ICD-10-CM

## 2017-10-18 DIAGNOSIS — F419 Anxiety disorder, unspecified: Secondary | ICD-10-CM

## 2017-10-18 MED ORDER — HYDROMORPHONE HCL 2 MG PO TABS: 2.0000 mg | ORAL_TABLET | ORAL | 0 refills | Status: DC | PRN

## 2017-10-18 MED ORDER — ALPRAZOLAM 0.5 MG PO TABS: 0.5000 mg | ORAL_TABLET | Freq: Three times a day (TID) | ORAL | 1 refills | Status: DC | PRN

## 2017-10-18 NOTE — Progress Notes (Signed)
Patient called cancer center requesting refill of Dilaudid 2 mg Tab q 4 hours PRN.   [place] Controlled Substance Reporting System reviewed and refill is appropriate on or after 10/19/17. Medication e-scribed to his pharmacy ([facility]) using [employer] 2-step verification process.    NCCSRS reviewed:     Faythe Casa, NP 10/18/2017 2:38 PM [phone]

## 2017-10-23 ENCOUNTER — Other Ambulatory Visit: Payer: Self-pay

## 2017-10-23 ENCOUNTER — Inpatient Hospital Stay: Payer: Medicaid Other | Attending: Internal Medicine

## 2017-10-23 DIAGNOSIS — R5383 Other fatigue: Secondary | ICD-10-CM | POA: Insufficient documentation

## 2017-10-23 DIAGNOSIS — J449 Chronic obstructive pulmonary disease, unspecified: Secondary | ICD-10-CM | POA: Insufficient documentation

## 2017-10-23 DIAGNOSIS — E785 Hyperlipidemia, unspecified: Secondary | ICD-10-CM | POA: Diagnosis not present

## 2017-10-23 DIAGNOSIS — R5381 Other malaise: Secondary | ICD-10-CM | POA: Diagnosis not present

## 2017-10-23 DIAGNOSIS — Z7982 Long term (current) use of aspirin: Secondary | ICD-10-CM | POA: Insufficient documentation

## 2017-10-23 DIAGNOSIS — I2510 Atherosclerotic heart disease of native coronary artery without angina pectoris: Secondary | ICD-10-CM | POA: Diagnosis not present

## 2017-10-23 DIAGNOSIS — K219 Gastro-esophageal reflux disease without esophagitis: Secondary | ICD-10-CM | POA: Insufficient documentation

## 2017-10-23 DIAGNOSIS — C9002 Multiple myeloma in relapse: Secondary | ICD-10-CM | POA: Insufficient documentation

## 2017-10-23 DIAGNOSIS — Z8042 Family history of malignant neoplasm of prostate: Secondary | ICD-10-CM | POA: Diagnosis not present

## 2017-10-23 DIAGNOSIS — I509 Heart failure, unspecified: Secondary | ICD-10-CM | POA: Diagnosis not present

## 2017-10-23 DIAGNOSIS — I252 Old myocardial infarction: Secondary | ICD-10-CM | POA: Insufficient documentation

## 2017-10-23 DIAGNOSIS — G8929 Other chronic pain: Secondary | ICD-10-CM | POA: Insufficient documentation

## 2017-10-23 DIAGNOSIS — F17210 Nicotine dependence, cigarettes, uncomplicated: Secondary | ICD-10-CM | POA: Insufficient documentation

## 2017-10-23 DIAGNOSIS — F419 Anxiety disorder, unspecified: Secondary | ICD-10-CM | POA: Diagnosis not present

## 2017-10-23 DIAGNOSIS — R110 Nausea: Secondary | ICD-10-CM | POA: Diagnosis not present

## 2017-10-23 DIAGNOSIS — Z79899 Other long term (current) drug therapy: Secondary | ICD-10-CM | POA: Diagnosis not present

## 2017-10-23 LAB — COMPREHENSIVE METABOLIC PANEL
ALBUMIN: 3.8 g/dL (ref 3.5–5.0)
ALK PHOS: 32 U/L — AB (ref 38–126)
ALT: 9 U/L (ref 0–44)
ANION GAP: 10 (ref 5–15)
AST: 15 U/L (ref 15–41)
BILIRUBIN TOTAL: 0.4 mg/dL (ref 0.3–1.2)
BUN: 12 mg/dL (ref 6–20)
CALCIUM: 9.2 mg/dL (ref 8.9–10.3)
CO2: 31 mmol/L (ref 22–32)
Chloride: 98 mmol/L (ref 98–111)
Creatinine, Ser: 0.75 mg/dL (ref 0.61–1.24)
GFR calc Af Amer: >60 mL/min (ref >60)
GLUCOSE: 103 mg/dL — AB (ref 70–99)
Potassium: 3.4 mmol/L — ABNORMAL LOW (ref 3.5–5.1)
SODIUM: 139 mmol/L (ref 135–145)
TOTAL PROTEIN: 6.9 g/dL (ref 6.5–8.1)

## 2017-10-23 LAB — CBC WITH DIFFERENTIAL/PLATELET
BASOS ABS: 0 10*3/uL (ref 0–0.1)
BASOS PCT: 0 %
EOS PCT: 3 %
Eosinophils Absolute: 0.2 10*3/uL (ref 0–0.7)
HCT: 37.1 % — ABNORMAL LOW (ref 40.0–52.0)
Hemoglobin: 12.3 g/dL — ABNORMAL LOW (ref 13.0–18.0)
Lymphocytes Relative: 24 %
Lymphs Abs: 1.5 10*3/uL (ref 1.0–3.6)
MCH: 29.4 pg (ref 26.0–34.0)
MCHC: 33.2 g/dL (ref 32.0–36.0)
MCV: 88.8 fL (ref 80.0–100.0)
MONO ABS: 0.5 10*3/uL (ref 0.2–1.0)
Monocytes Relative: 9 %
NEUTROS ABS: 4.1 10*3/uL (ref 1.4–6.5)
Neutrophils Relative %: 64 %
PLATELETS: 120 10*3/uL — AB (ref 150–440)
RBC: 4.17 MIL/uL — AB (ref 4.40–5.90)
RDW: 13.6 % (ref 11.5–14.5)
WBC: 6.4 10*3/uL (ref 3.8–10.6)

## 2017-10-24 LAB — MULTIPLE MYELOMA PANEL, SERUM
Albumin SerPl Elph-Mcnc: 3.5 g/dL (ref 2.9–4.4)
Albumin/Glob SerPl: 1.3 (ref 0.7–1.7)
Alpha 1: 0.2 g/dL (ref 0.0–0.4)
Alpha2 Glob SerPl Elph-Mcnc: 0.9 g/dL (ref 0.4–1.0)
B-GLOBULIN SERPL ELPH-MCNC: 0.8 g/dL (ref 0.7–1.3)
GAMMA GLOB SERPL ELPH-MCNC: 0.9 g/dL (ref 0.4–1.8)
GLOBULIN, TOTAL: 2.8 g/dL (ref 2.2–3.9)
IGA: 98 mg/dL (ref 90–386)
IgG (Immunoglobin G), Serum: 950 mg/dL (ref 700–1600)
IgM (Immunoglobulin M), Srm: 37 mg/dL (ref 20–172)
M PROTEIN SERPL ELPH-MCNC: 0.3 g/dL — AB
Total Protein ELP: 6.3 g/dL (ref 6.0–8.5)

## 2017-10-24 LAB — KAPPA/LAMBDA LIGHT CHAINS
KAPPA FREE LGHT CHN: 17.1 mg/L (ref 3.3–19.4)
KAPPA, LAMDA LIGHT CHAIN RATIO: 2.31 — AB (ref 0.26–1.65)
LAMDA FREE LIGHT CHAINS: 7.4 mg/L (ref 5.7–26.3)

## 2017-10-25 ENCOUNTER — Other Ambulatory Visit: Payer: Self-pay | Admitting: *Deleted

## 2017-10-25 ENCOUNTER — Other Ambulatory Visit: Payer: Self-pay | Admitting: Oncology

## 2017-10-25 MED ORDER — FENTANYL 25 MCG/HR TD PT72: 25.0000 ug | MEDICATED_PATCH | TRANSDERMAL | 0 refills | Status: DC

## 2017-10-25 MED ORDER — FENTANYL 100 MCG/HR TD PT72: 100.0000 ug | MEDICATED_PATCH | TRANSDERMAL | 0 refills | Status: DC

## 2017-10-25 NOTE — Progress Notes (Signed)
Patient called cancer center requesting refill of Fentanyl 25 Mcg and 100 Mcg patches.   [place] Controlled Substance Reporting System reviewed and refill is appropriate on or after 10/24/17 . Medication e-scribed to his pharmacy ([facility]) using [employer] 2-step verification process.    NCCSRS reviewed:     Faythe Casa, NP 10/25/2017 12:49 PM [phone]

## 2017-10-29 MED FILL — DEXAMETHASONE 4 MG TABLET: 4 | 28 days supply | Qty: 12 | Fill #2

## 2017-10-29 MED FILL — NINLARO 4 MG CAP: 4 | 28 days supply | Qty: 3 | Fill #2

## 2017-10-31 ENCOUNTER — Inpatient Hospital Stay (HOSPITAL_BASED_OUTPATIENT_CLINIC_OR_DEPARTMENT_OTHER): Payer: Medicaid Other | Admitting: Internal Medicine

## 2017-10-31 VITALS — BP 90/56 | HR 98 | Temp 97.3°F | Resp 16 | Wt 139.0 lb

## 2017-10-31 DIAGNOSIS — J449 Chronic obstructive pulmonary disease, unspecified: Secondary | ICD-10-CM

## 2017-10-31 DIAGNOSIS — G8929 Other chronic pain: Secondary | ICD-10-CM

## 2017-10-31 DIAGNOSIS — C9002 Multiple myeloma in relapse: Secondary | ICD-10-CM

## 2017-10-31 DIAGNOSIS — I509 Heart failure, unspecified: Secondary | ICD-10-CM

## 2017-10-31 DIAGNOSIS — R5381 Other malaise: Secondary | ICD-10-CM

## 2017-10-31 DIAGNOSIS — I2510 Atherosclerotic heart disease of native coronary artery without angina pectoris: Secondary | ICD-10-CM

## 2017-10-31 DIAGNOSIS — R5383 Other fatigue: Secondary | ICD-10-CM | POA: Diagnosis not present

## 2017-10-31 DIAGNOSIS — E785 Hyperlipidemia, unspecified: Secondary | ICD-10-CM

## 2017-10-31 DIAGNOSIS — R110 Nausea: Secondary | ICD-10-CM | POA: Diagnosis not present

## 2017-10-31 DIAGNOSIS — C9000 Multiple myeloma not having achieved remission: Secondary | ICD-10-CM

## 2017-10-31 DIAGNOSIS — F419 Anxiety disorder, unspecified: Secondary | ICD-10-CM

## 2017-10-31 DIAGNOSIS — F17210 Nicotine dependence, cigarettes, uncomplicated: Secondary | ICD-10-CM

## 2017-10-31 DIAGNOSIS — Z8042 Family history of malignant neoplasm of prostate: Secondary | ICD-10-CM

## 2017-10-31 DIAGNOSIS — K219 Gastro-esophageal reflux disease without esophagitis: Secondary | ICD-10-CM

## 2017-10-31 DIAGNOSIS — Z79899 Other long term (current) drug therapy: Secondary | ICD-10-CM

## 2017-10-31 DIAGNOSIS — Z7982 Long term (current) use of aspirin: Secondary | ICD-10-CM

## 2017-10-31 DIAGNOSIS — I252 Old myocardial infarction: Secondary | ICD-10-CM

## 2017-10-31 MED ORDER — ONDANSETRON HCL 8 MG PO TABS: ORAL_TABLET | ORAL | 0 refills | Status: DC

## 2017-10-31 MED ORDER — HYDROMORPHONE HCL 2 MG PO TABS: 2.0000 mg | ORAL_TABLET | ORAL | 0 refills | Status: DC | PRN

## 2017-10-31 NOTE — Assessment & Plan Note (Addendum)
#  MULTIPLE MYELOMA-IgG kappa;M protein=0.6; kappa light chains 570/elevated.AUG 2019-  Bone survey shows progressive lesions in the bilateral humeri/femur. Awaiting on PET scan.   # Currently on Ninlaro-Dex s/p 2 cycles- PR noted; Kappa light chains- 17; M protein= 0.3 gm/dl.   # Continue Ninlaro dexamethasone # 3 cycle on 10/16-for now.  tolerating well;   #  Osteonecrosis of the jaw- STABLE.  Denosumab discontinued.   # chronic pain/anxiety-STABLE; - on fentanyl [176mg] and Dilaudid 2 mg every 4 hours- new script sent Xanax.  # COPD- currently wheezing; worse; recommend nebs at home.   #Chronic nausea-question secondary to Ninlaro.  Continue Zofran as needed.  Prescription given.  DISPOSITION;  # follow up in 4 weeks/labs- 1 week prior- cbc/cmp/MM panel;K-l light chains;  PET ASAP-  Dr.B

## 2017-10-31 NOTE — Progress Notes (Signed)
[facility] OFFICE PROGRESS NOTE  Patient Care Team: Petra Kuba, MD as PCP - General (Family Medicine)  Cancer Staging No matching staging information was found for the patient.   Oncology History   # SEP 2015- MULTIPLE MYELOMA [multiple PET pos Bone lesions; hypercalcemia s/p BMBx; FISH- Aneuploidy - gain of chromosome 7,9,15,FGFR3/4p16.3, and CCND1/11q13. Loss of MAF/16q23.1), cytogenetics normal 46XY] s/p Vel-Dex-Rev-Zometa; Excellent PR;   # MARCH 2016- REV-DEX ; FEB 2017-[discn Dex] Rev 25 mg 3 w On & 1 w Off; March 2017- M- protein 0.4gm/dl; K/L= 7.9; cont Rev;   # Revlimid HELD June 2018 [sec to [facility]]  # AUG 2019- RECURRENCE- NINLARO-DEX  # March 2017-  Left Middle Lobe cavitary lesion- ~18m- repeat Ct in 3-463m# AUG 2017- [facility] [[name], [place] #  ? [facility]- [id number] not follow up ]- DISCONT Zometa.; NO HBO sec to   # Chronic pain/Anxiety [ortho]  # COPD/smoking/ [[facility]- not candidate for BMT- sec to co-morbidities/poor nutritional status];   --------------------------------------------------------------------   DIAGNOSIS: MULTIPLE MYELOMA  STAGE: RECURRENT  ;GOALS: CONTROL  CURRENT/MOST RECENT THERAPY; AUG 2019- NINLARO-DEX      [place] [place]     INTERVAL HISTORY:  [name] [age].o.  male pleasant patient above history of recurrent/relapse multiple myeloma-currently on Ninlaro 2 months ago is here for follow-up.  Patient has missed his PET scan appointments as he was not feeling well/also car broke down.  He states his pain is currently improved.  Is currently on fentanyl patches 125 mcg every 48 hours; also taking Dilaudid 2 mg about 6 pills a day.   He is currently gaining weight.  And chronic shortness of breath chronic wheezing.  Not using nebulizer as recommended.  Review of Systems  Constitutional: Positive for malaise/fatigue and weight loss. Negative for chills, diaphoresis and fever.  HENT:  Negative for nosebleeds and sore throat.   Eyes: Negative for double vision.  Respiratory: Positive for cough, shortness of breath and wheezing. Negative for hemoptysis and sputum production.   Cardiovascular: Negative for chest pain, palpitations, orthopnea and leg swelling.  Gastrointestinal: Positive for nausea. Negative for abdominal pain, blood in stool, constipation, diarrhea, heartburn, melena and vomiting.  Genitourinary: Negative for dysuria, frequency and urgency.  Musculoskeletal: Positive for back pain (worse) and joint pain.  Skin: Negative.  Negative for itching and rash.  Neurological: Negative for dizziness, tingling, focal weakness, weakness and headaches.  Endo/Heme/Allergies: Does not bruise/bleed easily.  Psychiatric/Behavioral: Negative for depression. The patient is not nervous/anxious and does not have insomnia.       PAST MEDICAL HISTORY :  Past Medical History:  Diagnosis Date  . Anxiety   . Arthritis   . Atrial septal defect   . CHF (congestive heart failure) ([facility]  . COPD (chronic obstructive pulmonary disease) ([facility]  . Coronary artery disease   . Dysrhythmias   . GERD (gastroesophageal reflux disease)   . Hyperlipidemia   . Multiple myeloma ([facility] [place]  . Multiple myeloma ([facility]  . Myocardial infarction ([facility]  . Substance abuse ([facility]    PAST SURGICAL HISTORY :   Past Surgical History:  Procedure Laterality Date  . CARDIAC DEFIBRILLATOR PLACEMENT      FAMILY HISTORY :   Family History  Problem Relation Age of Onset  . COPD Mother   . CAD Mother   . Heart attack Father   . Prostate cancer Maternal Grandfather   . Kidney  cancer Neg Hx   . Bladder Cancer Neg Hx     SOCIAL HISTORY:   Social History   Tobacco Use  . Smoking status: Current Every Day Smoker    Packs/day: 0.25    Years: 40.00    Pack years: 10.00    Types: Cigarettes  . Smokeless tobacco: Never Used  Substance Use Topics  . Alcohol use: No    Alcohol/week: 0.0 standard  drinks  . Drug use: No    ALLERGIES:  has No Known Allergies.  MEDICATIONS:  Current Outpatient Medications  Medication Sig Dispense Refill  . acetaminophen (TYLENOL) 325 MG tablet Take 2 tablets (650 mg total) by mouth every 6 (six) hours as needed for mild pain (or Fever >/= 101).    Marland Kitchen albuterol (PROVENTIL HFA;VENTOLIN HFA) 108 (90 Base) MCG/ACT inhaler Inhale 2 puffs into the lungs every 6 (six) hours as needed for wheezing or shortness of breath. 1 Inhaler 6  . ALPRAZolam (XANAX) 0.5 MG tablet Take 1 tablet (0.5 mg total) by mouth 3 (three) times daily as needed for anxiety. 90 tablet 1  . aspirin 325 MG EC tablet Take 325 mg by mouth daily.    Marland Kitchen atorvastatin (LIPITOR) 20 MG tablet Take 20 mg by mouth daily.  2  . dexamethasone (DECADRON) 4 MG tablet Take 3 tablets (12 mg total) by mouth once a week. Take with breakfast 12 tablet 4  . DULoxetine (CYMBALTA) 30 MG capsule Take 1 capsule (30 mg total) by mouth daily. 90 capsule 1  . feeding supplement, ENSURE ENLIVE, (ENSURE ENLIVE) LIQD Take 237 mLs by mouth 3 (three) times daily between meals. 90 Bottle 1  . fentaNYL (DURAGESIC - DOSED MCG/HR) 100 MCG/HR Place 1 patch (100 mcg total) onto the skin every other day. Use with a 25 mcg patch for total dose of 125 mcg 15 patch 0  . fentaNYL (DURAGESIC - DOSED MCG/HR) 25 MCG/HR patch Place 1 patch (25 mcg total) onto the skin every other day. Use with a 100 mcg patch for total dose of 125 mcg 15 patch 0  . Fluticasone-Salmeterol (ADVAIR DISKUS) 250-50 MCG/DOSE AEPB Inhale 1 puff into the lungs 2 (two) times daily. 3 each 2  . furosemide (LASIX) 20 MG tablet Take 20 mg by mouth daily.  0  . gabapentin (NEURONTIN) 100 MG capsule TAKE TWO CAPSULES BY MOUTH THREE TIMES DAILY 180 capsule 3  . [START ON 11/02/2017] HYDROmorphone (DILAUDID) 2 MG tablet Take 1 tablet (2 mg total) by mouth every 4 (four) hours as needed for severe pain. 84 tablet 0  . ipratropium-albuterol (DUONEB) 0.5-2.5 (3) MG/3ML SOLN  Take 3 mLs by nebulization every 4 (four) hours as needed. 360 mL 3  . ixazomib citrate (NINLARO) 4 MG capsule Take 1 capsule (4 mg total) by mouth once a week. Take for 3 weeks on, then 1 week off. Take on empty stomach 1hr before or 2hrs after food. 3 capsule 4  . megestrol (MEGACE) 20 MG tablet Take 1 tablet (20 mg total) by mouth daily. 30 tablet 2  . metoprolol tartrate (LOPRESSOR) 50 MG tablet Take 50 mg by mouth 2 (two) times daily.  2  . ondansetron (ZOFRAN) 4 MG tablet Take 1 tablet (4 mg total) by mouth every 8 (eight) hours as needed for nausea or vomiting. 21 tablet 3  . ondansetron (ZOFRAN) 8 MG tablet 1 pill every 8 hours as needed. 40 tablet 0  . OXYGEN Inhale 3 L into the lungs daily.    Marland Kitchen  polyethylene glycol (MIRALAX / GLYCOLAX) packet Take 17 g by mouth daily. 30 each 0  . tiotropium (SPIRIVA HANDIHALER) 18 MCG inhalation capsule Place 1 capsule (18 mcg total) into inhaler and inhale daily. 30 capsule 6  . cyproheptadine (PERIACTIN) 2 MG/5ML syrup Take 10 mLs (4 mg total) by mouth 2 (two) times daily. (Patient not taking: Reported on 09/17/2017) 240 mL 2   No current facility-administered medications for this visit.    Facility-Administered Medications Ordered in Other Visits  Medication Dose Route Frequency Provider Last Rate Last Dose  . 0.9 %  sodium chloride infusion   Intravenous Continuous Cammie Sickle, MD 10 mL/hr at 02/24/15 1520      PHYSICAL EXAMINATION: ECOG PERFORMANCE STATUS: 2 - Symptomatic, <50% confined to bed  BP (!) 90/56 (BP Location: Left Arm, Patient Position: Sitting)   Pulse 98   Temp (!) 97.3 F (36.3 C) (Tympanic)   Resp 16   Wt 139 lb (63 kg)   BMI 19.39 kg/m   Filed Weights   10/31/17 1103  Weight: 139 lb (63 kg)    Physical Exam  Constitutional: He is oriented to person, place, and time.  Cachectic appearing Caucasian male patient; 2 L nasal cannula.  Accompanied by friend.  HENT:  Head: Normocephalic and atraumatic.   Mouth/Throat: Oropharynx is clear and moist. No oropharyngeal exudate.  Eyes: Pupils are equal, round, and reactive to light.  Neck: Normal range of motion. Neck supple.  Cardiovascular: Normal rate and regular rhythm.  Pulmonary/Chest: No respiratory distress. He has wheezes.  Bilateral decreased air entry.  Abdominal: Soft. Bowel sounds are normal. He exhibits no distension and no mass. There is no tenderness. There is no rebound and no guarding.  Musculoskeletal: Normal range of motion. He exhibits no edema or tenderness.  Neurological: He is alert and oriented to person, place, and time.  Skin: Skin is warm.  Psychiatric: Affect normal.       LABORATORY DATA:  I have reviewed the data as listed    Component Value Date/Time   NA 139 10/23/2017 1100   NA 135 05/12/2014 1343   K 3.4 (L) 10/23/2017 1100   K 3.3 (L) 05/12/2014 1343   CL 98 10/23/2017 1100   CL 101 05/12/2014 1343   CO2 31 10/23/2017 1100   CO2 27 05/12/2014 1343   GLUCOSE 103 (H) 10/23/2017 1100   GLUCOSE 77 05/12/2014 1343   BUN 12 10/23/2017 1100   BUN 9 05/12/2014 1343   CREATININE 0.75 10/23/2017 1100   CREATININE 0.89 05/12/2014 1343   CALCIUM 9.2 10/23/2017 1100   CALCIUM 8.8 (L) 05/12/2014 1343   PROT 6.9 10/23/2017 1100   PROT 5.9 (L) 02/17/2014 1405   ALBUMIN 3.8 10/23/2017 1100   ALBUMIN 2.8 (L) 02/17/2014 1405   AST 15 10/23/2017 1100   AST 9 (L) 02/17/2014 1405   ALT 9 10/23/2017 1100   ALT 12 (L) 02/17/2014 1405   ALKPHOS 32 (L) 10/23/2017 1100   ALKPHOS 44 (L) 02/17/2014 1405   BILITOT 0.4 10/23/2017 1100   BILITOT 0.2 02/17/2014 1405   GFRNONAA >60 10/23/2017 1100   GFRNONAA >60 05/12/2014 1343   GFRAA >60 10/23/2017 1100   GFRAA >60 05/12/2014 1343    No results found for: SPEP, UPEP  Lab Results  Component Value Date   WBC 6.4 10/23/2017   NEUTROABS 4.1 10/23/2017   HGB 12.3 (L) 10/23/2017   HCT 37.1 (L) 10/23/2017   MCV 88.8 10/23/2017   PLT 120 (  L) 10/23/2017       Chemistry      Component Value Date/Time   NA 139 10/23/2017 1100   NA 135 05/12/2014 1343   K 3.4 (L) 10/23/2017 1100   K 3.3 (L) 05/12/2014 1343   CL 98 10/23/2017 1100   CL 101 05/12/2014 1343   CO2 31 10/23/2017 1100   CO2 27 05/12/2014 1343   BUN 12 10/23/2017 1100   BUN 9 05/12/2014 1343   CREATININE 0.75 10/23/2017 1100   CREATININE 0.89 05/12/2014 1343      Component Value Date/Time   CALCIUM 9.2 10/23/2017 1100   CALCIUM 8.8 (L) 05/12/2014 1343   ALKPHOS 32 (L) 10/23/2017 1100   ALKPHOS 44 (L) 02/17/2014 1405   AST 15 10/23/2017 1100   AST 9 (L) 02/17/2014 1405   ALT 9 10/23/2017 1100   ALT 12 (L) 02/17/2014 1405   BILITOT 0.4 10/23/2017 1100   BILITOT 0.2 02/17/2014 1405       RADIOGRAPHIC STUDIES: I have personally reviewed the radiological images as listed and agreed with the findings in the report. No results found.   ASSESSMENT & PLAN:  Multiple myeloma in relapse ([place]) # MULTIPLE MYELOMA-IgG kappa;M protein=0.6; kappa light chains 570/elevated.AUG 2019-  Bone survey shows progressive lesions in the bilateral humeri/femur. Awaiting on PET scan.   # Currently on Ninlaro-Dex s/p 2 cycles- PR noted; Kappa light chains- 17; M protein= 0.3 gm/dl.   # Continue Ninlaro dexamethasone # 3 cycle on 10/16-for now.  tolerating well;   #  Osteonecrosis of the jaw- STABLE.  Denosumab discontinued.   # chronic pain/anxiety-STABLE; - on fentanyl [171mg] and Dilaudid 2 mg every 4 hours- new script sent Xanax.  # COPD- currently wheezing; worse; recommend nebs at home.   #Chronic nausea-question secondary to Ninlaro.  Continue Zofran as needed.  Prescription given.  DISPOSITION;  # follow up in 4 weeks/labs- 1 week prior- cbc/cmp/MM panel;K-l light chains;  PET ASAP-  Dr.B    Orders Placed This Encounter  Procedures  . CBC with Differential/Platelet    Standing Status:   Standing    Number of Occurrences:   20    Standing Expiration Date:   11/01/2018  .  Comprehensive metabolic panel    Standing Status:   Standing    Number of Occurrences:   20    Standing Expiration Date:   11/01/2018  . Kappa/lambda light chains    Standing Status:   Standing    Number of Occurrences:   20    Standing Expiration Date:   11/01/2018  . Multiple Myeloma Panel (SPEP&IFE w/QIG)    Standing Status:   Standing    Number of Occurrences:   20    Standing Expiration Date:   11/01/2018   All questions were answered. The patient knows to call the clinic with any problems, questions or concerns.      GCammie Sickle MD 10/31/2017 11:36 AM

## 2017-11-05 ENCOUNTER — Ambulatory Visit
Admission: RE | Admit: 2017-11-05 | Discharge: 2017-11-05 | Disposition: A | Discharge status: Home / Self Care | Payer: Medicaid Other | Source: Ambulatory Visit | Attending: Internal Medicine | Admitting: Internal Medicine

## 2017-11-05 DIAGNOSIS — G893 Neoplasm related pain (acute) (chronic): Secondary | ICD-10-CM | POA: Diagnosis not present

## 2017-11-05 DIAGNOSIS — C9002 Multiple myeloma in relapse: Secondary | ICD-10-CM | POA: Insufficient documentation

## 2017-11-05 LAB — GLUCOSE, CAPILLARY: Glucose-Capillary: 121 mg/dL — ABNORMAL HIGH (ref 70–99)

## 2017-11-05 MED ORDER — FLUDEOXYGLUCOSE F - 18 (FDG) INJECTION
7.6500 | Freq: Once | INTRAVENOUS | Status: AC | PRN
  Administered 2017-11-05: 7.65 via INTRAVENOUS

## 2017-11-15 ENCOUNTER — Other Ambulatory Visit: Payer: Self-pay | Admitting: *Deleted

## 2017-11-15 DIAGNOSIS — C9000 Multiple myeloma not having achieved remission: Secondary | ICD-10-CM

## 2017-11-15 MED ORDER — HYDROMORPHONE HCL 2 MG PO TABS: 2.0000 mg | ORAL_TABLET | ORAL | 0 refills | Status: DC | PRN

## 2017-11-15 NOTE — Telephone Encounter (Signed)
Patient called cancer center requesting refill of Dilaudid 2 mg tab q 4 PRN for pain  As mandated by the [place] STOP Act (Strengthen Opioid Misuse Prevention), the  Controlled Substance Reporting System ([place]) was reviewed for this patient.  Below is the past 64-months of controlled substance prescriptions as displayed by the registry.  I have personally consulted with my supervising physician, Dr. Rogue Bussing, who agrees that continuation of opiate therapy is medically appropriate at this time and agrees to provide continual monitoring, including urine/blood drug screens, as indicated. Refill is appropriate on or after 11/15/17.  NCCSRS reviewed:     Faythe Casa, NP 11/15/2017 1:37 PM [phone]

## 2017-11-20 ENCOUNTER — Encounter: Payer: Self-pay | Admitting: Internal Medicine

## 2017-11-20 ENCOUNTER — Ambulatory Visit (INDEPENDENT_AMBULATORY_CARE_PROVIDER_SITE_OTHER): Payer: Medicaid Other | Admitting: Internal Medicine

## 2017-11-20 VITALS — BP 110/60 | HR 109 | Ht 72.0 in | Wt 136.8 lb

## 2017-11-20 DIAGNOSIS — J9611 Chronic respiratory failure with hypoxia: Secondary | ICD-10-CM | POA: Diagnosis not present

## 2017-11-20 DIAGNOSIS — F17210 Nicotine dependence, cigarettes, uncomplicated: Secondary | ICD-10-CM | POA: Diagnosis not present

## 2017-11-20 DIAGNOSIS — Z23 Encounter for immunization: Secondary | ICD-10-CM

## 2017-11-20 DIAGNOSIS — J449 Chronic obstructive pulmonary disease, unspecified: Secondary | ICD-10-CM

## 2017-11-20 NOTE — Patient Instructions (Signed)
Continue OXYGEN AS prescribed  Continue Inhalers as prescribed  FLU SHOT to be given today  STOP SMOKING!!

## 2017-11-20 NOTE — Progress Notes (Signed)
Name: [name] MRN:   [id number] DOB:   11-09-1960          consult date 3.18.19   Hospital follow-up patient seen by Dr. Alva [name] in the hospital  BRIEF PATIENT DESCRIPTION:  57 yo male with known history of Diastolic Heart failure, COPD and Multiple Myeloma admitted with acute encephalopathy, nausea/vomiting, acute on chronic hypoxic respiratory failure secondary to possible aspiration pneumonia and AECOPD  CXR 05/23/16 I have Independently reviewed images of CXR   Interpretation:no acutue process, no effusions  SIGNIFICANT EVENTS  5/1 Patient admitted to the ICU with increased sob requiring NRB  CC follow up COPD and SOB/DOE  HPI Patient has chronic shortness of breath and dyspnea on exertion Seems to be stable at this time on current inhaler regimen I have explained to the patient that he may need nebulizer therapy in the future  At this time he uses chronic oxygen therapy 3 L nasal cannula 24/7 Patient uses and benefits from oxygen therapy  He has chronic intermittent wheezing He continues to smoke daily  Patient states he has had nightmares on Chantix and on nicotine patches    No signs of infection at this time No signs of heart failure at this time  No significant weight loss since last visit Has decent appetite    Smoking Assessment and Cessation Counseling   Upon further questioning, Patient smokes while1/2 PPD  I have advised patient to quit/stop smoking as soon as possible due to high risk for multiple medical problems  Patient is willing to quit smoking  I have advised patient that we can assist and have options of Nicotine replacement therapy. I also advised patient on behavioral therapy and can provide oral medication therapy in conjunction with the other therapies  Follow up next Office visit  for assessment of smoking cessation  Smoking cessation counseling advised for 4 minutes    Review of Systems:  Gen:  Denies  fever, sweats,  chills weigh loss  HEENT: Denies blurred vision, double vision, ear pain, eye pain, hearing loss, nose bleeds, sore throat Cardiac:  No dizziness, chest pain or heaviness, chest tightness,edema, No JVD Resp:   +cough -sputum production, +shortness of breath,+wheezing, -hemoptysis,  Gi: Denies swallowing difficulty, stomach pain, nausea or vomiting, diarrhea, constipation, bowel incontinence Gu:  Denies bladder incontinence, burning urine Ext:   Denies Joint pain, stiffness or swelling Skin: Denies  skin rash, easy bruising or bleeding or hives Endoc:  Denies polyuria, polydipsia , polyphagia or weight change Psych:   Denies depression, insomnia or hallucinations  Other:  All other systems negative    Ht 6' (1.829 m)   Wt 136 lb 12.8 oz (62.1 kg)   BMI 18.55 kg/m  BP 110/60 (BP Location: Left Arm, Cuff Size: Normal)   Pulse (!) 109   Ht 6' (1.829 m)   Wt 136 lb 12.8 oz (62.1 kg)   SpO2 99%   BMI 18.55 kg/m    Physical Examination:   GENERAL:NAD, no fevers, chills, no weakness no fatigue HEAD: Normocephalic, atraumatic.  EYES: Pupils equal, round, reactive to light. Extraocular muscles intact. No scleral icterus.  MOUTH: Moist mucosal membrane. Dentition intact. No abscess noted.  EAR, NOSE, THROAT: Clear without exudates. No external lesions.  NECK: Supple. No thyromegaly. No nodules. No JVD.  PULMONARY: CTA B/L +wheezing, rhonchi, crackles CARDIOVASCULAR: S1 and S2. Regular rate and rhythm. No murmurs, rubs, or gallops. No edema. Pedal pulses 2+ bilaterally.  GASTROINTESTINAL: Soft, nontender, nondistended. No masses. Positive  bowel sounds. No hepatosplenomegaly.  MUSCULOSKELETAL: No swelling, clubbing, or edema. Range of motion full in all extremities.  NEUROLOGIC: Cranial nerves II through XII are intact. No gross focal neurological deficits. Sensation intact. Reflexes intact.  SKIN: No ulceration, lesions, rashes, or cyanosis. Skin warm and dry. Turgor intact.    PSYCHIATRIC: Mood, affect within normal limits. The patient is awake, alert and oriented x 3. Insight, judgment intact.  ALL OTHER ROS ARE NEGATIVE         ASSESSMENT / PLAN: 57 year old ill-appearing thin white male with end-stage COPD with progressive chronic respiratory failure with hypoxia in setting of pulmonary cachexia deconditioned state with ongoing tobacco abuse with underlying multiple myeloma  #1 shortness of breath and dyspnea on exertion with ongoing wheezing Related to underlying COPD and deconditioned state and ongoing tobacco abuse  #2 end-stage COPD severe Gold stage D Continue inhalers as prescribed Patient currently on Advair and Spiriva There may be a point when patient may need nebulized therapy to support his respiratory status  No indication for steroids or ABX at this time  #3 chronic hypoxic respiratory failure from COPD Continue oxygen as prescribed Patient uses and benefits from oxygen therapy He will need this for survival   #4 cough related to his COPD and smoking  #5 tobacco abuse Smoking cessation strongly advised Chantix gives patient nightmares Nicotine patches also causes nightmares   #6 multiple myeloma Follow-up oncology as scheduled Chemo once per week  Patient satisfied with plan of action and management all questions answered Overall prognosis is poor patient with end-stage respiratory failure with chronic hypoxia  Follow-up in 6 months for reassessment    [name], M.D.  [name]  Medical Director [place] Director [facility] Cardio-Pulmonary Department

## 2017-11-21 ENCOUNTER — Inpatient Hospital Stay: Payer: Medicaid Other

## 2017-11-21 ENCOUNTER — Other Ambulatory Visit: Payer: Self-pay | Admitting: Internal Medicine

## 2017-11-22 ENCOUNTER — Other Ambulatory Visit: Payer: Self-pay | Admitting: Internal Medicine

## 2017-11-22 ENCOUNTER — Inpatient Hospital Stay: Payer: Medicaid Other

## 2017-11-22 DIAGNOSIS — C9002 Multiple myeloma in relapse: Secondary | ICD-10-CM | POA: Diagnosis not present

## 2017-11-22 LAB — CBC WITH DIFFERENTIAL/PLATELET
ABS IMMATURE GRANULOCYTES: 0.04 10*3/uL (ref 0.00–0.07)
BASOS PCT: 0 %
Basophils Absolute: 0 10*3/uL (ref 0.0–0.1)
Eosinophils Absolute: 0 10*3/uL (ref 0.0–0.5)
Eosinophils Relative: 0 %
HCT: 34.9 % — ABNORMAL LOW (ref 39.0–52.0)
Hemoglobin: 11.2 g/dL — ABNORMAL LOW (ref 13.0–17.0)
IMMATURE GRANULOCYTES: 0 %
Lymphocytes Relative: 12 %
Lymphs Abs: 1.3 10*3/uL (ref 0.7–4.0)
MCH: 28.2 pg (ref 26.0–34.0)
MCHC: 32.1 g/dL (ref 30.0–36.0)
MCV: 87.9 fL (ref 80.0–100.0)
Monocytes Absolute: 1.1 10*3/uL — ABNORMAL HIGH (ref 0.1–1.0)
Monocytes Relative: 10 %
NEUTROS ABS: 8.1 10*3/uL — AB (ref 1.7–7.7)
NEUTROS PCT: 78 %
NRBC: 0 % (ref 0.0–0.2)
PLATELETS: 135 10*3/uL — AB (ref 150–400)
RBC: 3.97 MIL/uL — ABNORMAL LOW (ref 4.22–5.81)
RDW: 12.5 % (ref 11.5–15.5)
WBC: 10.5 10*3/uL (ref 4.0–10.5)

## 2017-11-22 LAB — COMPREHENSIVE METABOLIC PANEL
ALK PHOS: 31 U/L — AB (ref 38–126)
ALT: 8 U/L (ref 0–44)
AST: 18 U/L (ref 15–41)
Albumin: 4 g/dL (ref 3.5–5.0)
Anion gap: 9 (ref 5–15)
BILIRUBIN TOTAL: 0.6 mg/dL (ref 0.3–1.2)
BUN: 12 mg/dL (ref 6–20)
CO2: 32 mmol/L (ref 22–32)
Calcium: 9.2 mg/dL (ref 8.9–10.3)
Chloride: 95 mmol/L — ABNORMAL LOW (ref 98–111)
Creatinine, Ser: 0.68 mg/dL (ref 0.61–1.24)
GFR calc Af Amer: >60 mL/min (ref >60)
Glucose, Bld: 81 mg/dL (ref 70–99)
POTASSIUM: 3.1 mmol/L — AB (ref 3.5–5.1)
Sodium: 136 mmol/L (ref 135–145)
TOTAL PROTEIN: 6.9 g/dL (ref 6.5–8.1)

## 2017-11-22 MED ORDER — FENTANYL 100 MCG/HR TD PT72: 100.0000 ug | MEDICATED_PATCH | TRANSDERMAL | 0 refills | Status: DC

## 2017-11-22 MED ORDER — FENTANYL 25 MCG/HR TD PT72: 25.0000 ug | MEDICATED_PATCH | TRANSDERMAL | 0 refills | Status: DC

## 2017-11-23 ENCOUNTER — Other Ambulatory Visit: Payer: Self-pay | Admitting: *Deleted

## 2017-11-23 LAB — KAPPA/LAMBDA LIGHT CHAINS
Kappa free light chain: 11.1 mg/L (ref 3.3–19.4)
Kappa, lambda light chain ratio: 1.82 — ABNORMAL HIGH (ref 0.26–1.65)
Lambda free light chains: 6.1 mg/L (ref 5.7–26.3)

## 2017-11-23 MED ORDER — FENTANYL 50 MCG/HR TD PT72: 100.0000 ug | MEDICATED_PATCH | TRANSDERMAL | 0 refills | Status: DC

## 2017-11-23 NOTE — Telephone Encounter (Signed)
Patient called cancer center requesting refill of Fentanyl patch 125 mcg.   As mandated by the [place] STOP Act (Strengthen Opioid Misuse Prevention), the [place] Controlled Substance Reporting System ([place]) was reviewed for this patient.  Below is the past 30-months of controlled substance prescriptions as displayed by the registry.  I have personally consulted with my supervising physician, Rogue Bussing, who agrees that continuation of opiate therapy is medically appropriate at this time and agrees to provide continual monitoring, including urine/blood drug screens, as indicated. Refill is appropriate on or after 11/23/17.  NCCSRS reviewed:     [name] Casa, NP 11/23/2017 12:16 PM [phone]

## 2017-11-23 NOTE — Telephone Encounter (Signed)
Pharmacy out of 100 mcg patches and patient due to change patches Sunday. Need order for 1 box of 50 mcg patches sent in and the 100 mcg patches should be available Monday PM or Tuesday

## 2017-11-26 LAB — MULTIPLE MYELOMA PANEL, SERUM
ALPHA 1: 0.3 g/dL (ref 0.0–0.4)
ALPHA2 GLOB SERPL ELPH-MCNC: 0.9 g/dL (ref 0.4–1.0)
Albumin SerPl Elph-Mcnc: 3.6 g/dL (ref 2.9–4.4)
Albumin/Glob SerPl: 1.3 (ref 0.7–1.7)
B-GLOBULIN SERPL ELPH-MCNC: 0.9 g/dL (ref 0.7–1.3)
Gamma Glob SerPl Elph-Mcnc: 0.8 g/dL (ref 0.4–1.8)
Globulin, Total: 2.8 g/dL (ref 2.2–3.9)
IGG (IMMUNOGLOBIN G), SERUM: 939 mg/dL (ref 700–1600)
IGM (IMMUNOGLOBULIN M), SRM: 36 mg/dL (ref 20–172)
IgA: 91 mg/dL (ref 90–386)
M PROTEIN SERPL ELPH-MCNC: 0.2 g/dL — AB
TOTAL PROTEIN ELP: 6.4 g/dL (ref 6.0–8.5)

## 2017-11-26 MED FILL — NINLARO 4 MG CAP: 4 | 28 days supply | Qty: 3 | Fill #3

## 2017-11-26 MED FILL — DEXAMETHASONE 4 MG TABLET: 4 | 28 days supply | Qty: 12 | Fill #3

## 2017-11-27 ENCOUNTER — Other Ambulatory Visit: Payer: Self-pay | Admitting: *Deleted

## 2017-11-27 DIAGNOSIS — C9000 Multiple myeloma not having achieved remission: Secondary | ICD-10-CM

## 2017-11-27 MED ORDER — GABAPENTIN 100 MG PO CAPS: 200.0000 mg | ORAL_CAPSULE | Freq: Three times a day (TID) | ORAL | 3 refills | Status: DC

## 2017-11-28 ENCOUNTER — Encounter: Payer: Self-pay | Admitting: Internal Medicine

## 2017-11-28 ENCOUNTER — Inpatient Hospital Stay: Payer: Medicaid Other | Attending: Internal Medicine | Admitting: Internal Medicine

## 2017-11-28 VITALS — BP 101/68 | HR 106 | Temp 98.0°F | Resp 20 | Wt 132.5 lb

## 2017-11-28 DIAGNOSIS — Z7982 Long term (current) use of aspirin: Secondary | ICD-10-CM | POA: Diagnosis not present

## 2017-11-28 DIAGNOSIS — G8929 Other chronic pain: Secondary | ICD-10-CM | POA: Insufficient documentation

## 2017-11-28 DIAGNOSIS — Z79899 Other long term (current) drug therapy: Secondary | ICD-10-CM | POA: Diagnosis not present

## 2017-11-28 DIAGNOSIS — F419 Anxiety disorder, unspecified: Secondary | ICD-10-CM | POA: Insufficient documentation

## 2017-11-28 DIAGNOSIS — J449 Chronic obstructive pulmonary disease, unspecified: Secondary | ICD-10-CM | POA: Insufficient documentation

## 2017-11-28 DIAGNOSIS — C9002 Multiple myeloma in relapse: Secondary | ICD-10-CM

## 2017-11-28 DIAGNOSIS — C9000 Multiple myeloma not having achieved remission: Secondary | ICD-10-CM

## 2017-11-28 DIAGNOSIS — F17210 Nicotine dependence, cigarettes, uncomplicated: Secondary | ICD-10-CM

## 2017-11-28 MED ORDER — HYDROMORPHONE HCL 2 MG PO TABS: 2.0000 mg | ORAL_TABLET | ORAL | 0 refills | Status: DC | PRN

## 2017-11-28 NOTE — Progress Notes (Signed)
Pt in for follow up, reports had diarrhea for past 1 week which has resolved.

## 2017-11-28 NOTE — Progress Notes (Signed)
[facility] OFFICE PROGRESS NOTE  Patient Care Team: Petra Kuba, MD as PCP - General (Family Medicine)  Cancer Staging No matching staging information was found for the patient.   Oncology History   # SEP 2015- MULTIPLE MYELOMA [multiple PET pos Bone lesions; hypercalcemia s/p BMBx; FISH- Aneuploidy - gain of chromosome 7,9,15,FGFR3/4p16.3, and CCND1/11q13. Loss of MAF/16q23.1), cytogenetics normal 46XY] s/p Vel-Dex-Rev-Zometa; Excellent PR;   # MARCH 2016- REV-DEX ; FEB 2017-[discn Dex] Rev 25 mg 3 w On & 1 w Off; March 2017- M- protein 0.4gm/dl; K/L= 7.9; cont Rev;   # Revlimid HELD June 2018 [sec to [facility]]  # AUG 2019- RECURRENCE- NINLARO-DEX  # March 2017-  Left Middle Lobe cavitary lesion- ~65m- repeat Ct in 3-492m# AUG 2017- [facility] [[name], [place] #  ? [facility]- [phone] not follow up ]- DISCONT Zometa.; NO HBO sec to   # Chronic pain/Anxiety [ortho]  # COPD/smoking/ [[facility]- not candidate for BMT- sec to co-morbidities/poor nutritional status];   --------------------------------------------------------------------   DIAGNOSIS: MULTIPLE MYELOMA  STAGE: RECURRENT  ;GOALS: CONTROL  CURRENT/MOST RECENT THERAPY; AUG 2019- NINLARO-DEX      Multiple myeloma in relapse ([facility]     INTERVAL HISTORY:  [name] [age].o.  male pleasant patient above history of recurrent/relapse multiple myeloma-currently on Ninlaro 2 months ago is here for follow-up/review there is a PET scan.  Patient states that he has a recent episode of diarrhea multiple loose stools a day.  States other family members have been sick with the same " bug".   States his pain is stable.  Continues to be on fentanyl patches 125 mcg every 48 hours.  Also Dilaudid 2 mg every 4 hours.   Patient is gaining weight.  Continue chronic shortness of breath wheezing.  However has been more compliant with nebulizer.   Review of Systems  Constitutional: Positive for malaise/fatigue and  weight loss. Negative for chills, diaphoresis and fever.  HENT: Negative for nosebleeds and sore throat.   Eyes: Negative for double vision.  Respiratory: Positive for cough, shortness of breath and wheezing. Negative for hemoptysis and sputum production.   Cardiovascular: Negative for chest pain, palpitations, orthopnea and leg swelling.  Gastrointestinal: Positive for nausea. Negative for abdominal pain, blood in stool, constipation, diarrhea, heartburn, melena and vomiting.  Genitourinary: Negative for dysuria, frequency and urgency.  Musculoskeletal: Positive for back pain (worse) and joint pain.  Skin: Negative.  Negative for itching and rash.  Neurological: Negative for dizziness, tingling, focal weakness, weakness and headaches.  Endo/Heme/Allergies: Does not bruise/bleed easily.  Psychiatric/Behavioral: Negative for depression. The patient is not nervous/anxious and does not have insomnia.       PAST MEDICAL HISTORY :  Past Medical History:  Diagnosis Date  . Anxiety   . Arthritis   . Atrial septal defect   . CHF (congestive heart failure) ([facility]  . COPD (chronic obstructive pulmonary disease) ([facility]  . Coronary artery disease   . Dysrhythmias   . GERD (gastroesophageal reflux disease)   . Hyperlipidemia   . Multiple myeloma ([facility]  . Multiple myeloma ([facility]  . Myocardial infarction ([facility]  . Substance abuse ([facility]    PAST SURGICAL HISTORY :   Past Surgical History:  Procedure Laterality Date  . CARDIAC DEFIBRILLATOR PLACEMENT      FAMILY HISTORY :   Family History  Problem Relation Age of Onset  . COPD Mother   . CAD Mother   .  Heart attack Father   . Prostate cancer Maternal Grandfather   . Kidney cancer Neg Hx   . Bladder Cancer Neg Hx     SOCIAL HISTORY:   Social History   Tobacco Use  . Smoking status: Current Every Day Smoker    Packs/day: 0.50    Years: 40.00    Pack years: 20.00    Types: Cigarettes  . Smokeless tobacco: Never Used   Substance Use Topics  . Alcohol use: No    Alcohol/week: 0.0 standard drinks  . Drug use: No    ALLERGIES:  has No Known Allergies.  MEDICATIONS:  Current Outpatient Medications  Medication Sig Dispense Refill  . acetaminophen (TYLENOL) 325 MG tablet Take 2 tablets (650 mg total) by mouth every 6 (six) hours as needed for mild pain (or Fever >/= 101).    Marland Kitchen albuterol (PROVENTIL HFA;VENTOLIN HFA) 108 (90 Base) MCG/ACT inhaler Inhale 2 puffs into the lungs every 6 (six) hours as needed for wheezing or shortness of breath. 1 Inhaler 6  . ALPRAZolam (XANAX) 0.5 MG tablet Take 1 tablet (0.5 mg total) by mouth 3 (three) times daily as needed for anxiety. 90 tablet 1  . aspirin 325 MG EC tablet Take 325 mg by mouth daily.    Marland Kitchen atorvastatin (LIPITOR) 20 MG tablet Take 20 mg by mouth daily.  2  . cyproheptadine (PERIACTIN) 2 MG/5ML syrup Take 10 mLs (4 mg total) by mouth 2 (two) times daily. 240 mL 2  . dexamethasone (DECADRON) 4 MG tablet Take 3 tablets (12 mg total) by mouth once a week. Take with breakfast 12 tablet 4  . DULoxetine (CYMBALTA) 30 MG capsule TAKE ONE CAPSULE BY MOUTH EVERY DAY 90 capsule 1  . feeding supplement, ENSURE ENLIVE, (ENSURE ENLIVE) LIQD Take 237 mLs by mouth 3 (three) times daily between meals. 90 Bottle 1  . fentaNYL (DURAGESIC - DOSED MCG/HR) 100 MCG/HR Place 1 patch (100 mcg total) onto the skin every other day. Use with a 25 mcg patch for total dose of 125 mcg 15 patch 0  . fentaNYL (DURAGESIC - DOSED MCG/HR) 25 MCG/HR patch Place 1 patch (25 mcg total) onto the skin every other day. Use with a 100 mcg patch for total dose of 125 mcg 15 patch 0  . Fluticasone-Salmeterol (ADVAIR DISKUS) 250-50 MCG/DOSE AEPB Inhale 1 puff into the lungs 2 (two) times daily. 3 each 2  . furosemide (LASIX) 20 MG tablet Take 20 mg by mouth daily.  0  . gabapentin (NEURONTIN) 100 MG capsule Take 2 capsules (200 mg total) by mouth 3 (three) times daily. 180 capsule 3  .  ipratropium-albuterol (DUONEB) 0.5-2.5 (3) MG/3ML SOLN Take 3 mLs by nebulization every 4 (four) hours as needed. 360 mL 3  . ixazomib citrate (NINLARO) 4 MG capsule Take 1 capsule (4 mg total) by mouth once a week. Take for 3 weeks on, then 1 week off. Take on empty stomach 1hr before or 2hrs after food. 3 capsule 4  . megestrol (MEGACE) 20 MG tablet TAKE ONE TABLET BY MOUTH ONCE DAILY 30 tablet 2  . metoprolol tartrate (LOPRESSOR) 50 MG tablet Take 50 mg by mouth 2 (two) times daily.  2  . ondansetron (ZOFRAN) 4 MG tablet Take 1 tablet (4 mg total) by mouth every 8 (eight) hours as needed for nausea or vomiting. 21 tablet 3  . ondansetron (ZOFRAN) 8 MG tablet 1 pill every 8 hours as needed. 40 tablet 0  . OXYGEN Inhale 3  L into the lungs daily.    . polyethylene glycol (MIRALAX / GLYCOLAX) packet Take 17 g by mouth daily. 30 each 0  . tiotropium (SPIRIVA HANDIHALER) 18 MCG inhalation capsule Place 1 capsule (18 mcg total) into inhaler and inhale daily. 30 capsule 6  . HYDROmorphone (DILAUDID) 2 MG tablet Take 1 tablet (2 mg total) by mouth every 4 (four) hours as needed for severe pain. 84 tablet 0   No current facility-administered medications for this visit.    Facility-Administered Medications Ordered in Other Visits  Medication Dose Route Frequency Provider Last Rate Last Dose  . 0.9 %  sodium chloride infusion   Intravenous Continuous Cammie Sickle, MD 10 mL/hr at 02/24/15 1520      PHYSICAL EXAMINATION: ECOG PERFORMANCE STATUS: 2 - Symptomatic, <50% confined to bed  BP 101/68 (BP Location: Left Arm, Patient Position: Sitting)   Pulse (!) 106   Temp 98 F (36.7 C) (Oral)   Resp 20   Wt 132 lb 8 oz (60.1 kg)   SpO2 97%   BMI 17.97 kg/m   Filed Weights   11/28/17 1439  Weight: 132 lb 8 oz (60.1 kg)    Physical Exam  Constitutional: He is oriented to person, place, and time.  Cachectic appearing Caucasian male patient; 2 L nasal cannula.  Is alone.  In a wheelchair.   HENT:  Head: Normocephalic and atraumatic.  Mouth/Throat: Oropharynx is clear and moist. No oropharyngeal exudate.  Eyes: Pupils are equal, round, and reactive to light.  Neck: Normal range of motion. Neck supple.  Cardiovascular: Normal rate and regular rhythm.  Pulmonary/Chest: No respiratory distress. He has wheezes.  Bilateral decreased air entry.  Abdominal: Soft. Bowel sounds are normal. He exhibits no distension and no mass. There is no tenderness. There is no rebound and no guarding.  Musculoskeletal: Normal range of motion. He exhibits no edema or tenderness.  Neurological: He is alert and oriented to person, place, and time.  Skin: Skin is warm.  Psychiatric: Affect normal.       LABORATORY DATA:  I have reviewed the data as listed    Component Value Date/Time   NA 136 11/22/2017 1350   NA 135 05/12/2014 1343   K 3.1 (L) 11/22/2017 1350   K 3.3 (L) 05/12/2014 1343   CL 95 (L) 11/22/2017 1350   CL 101 05/12/2014 1343   CO2 32 11/22/2017 1350   CO2 27 05/12/2014 1343   GLUCOSE 81 11/22/2017 1350   GLUCOSE 77 05/12/2014 1343   BUN 12 11/22/2017 1350   BUN 9 05/12/2014 1343   CREATININE 0.68 11/22/2017 1350   CREATININE 0.89 05/12/2014 1343   CALCIUM 9.2 11/22/2017 1350   CALCIUM 8.8 (L) 05/12/2014 1343   PROT 6.9 11/22/2017 1350   PROT 5.9 (L) 02/17/2014 1405   ALBUMIN 4.0 11/22/2017 1350   ALBUMIN 2.8 (L) 02/17/2014 1405   AST 18 11/22/2017 1350   AST 9 (L) 02/17/2014 1405   ALT 8 11/22/2017 1350   ALT 12 (L) 02/17/2014 1405   ALKPHOS 31 (L) 11/22/2017 1350   ALKPHOS 44 (L) 02/17/2014 1405   BILITOT 0.6 11/22/2017 1350   BILITOT 0.2 02/17/2014 1405   GFRNONAA >60 11/22/2017 1350   GFRNONAA >60 05/12/2014 1343   GFRAA >60 11/22/2017 1350   GFRAA >60 05/12/2014 1343    No results found for: SPEP, UPEP  Lab Results  Component Value Date   WBC 10.5 11/22/2017   NEUTROABS 8.1 (H) 11/22/2017  HGB 11.2 (L) 11/22/2017   HCT 34.9 (L) 11/22/2017   MCV  87.9 11/22/2017   PLT 135 (L) 11/22/2017      Chemistry      Component Value Date/Time   NA 136 11/22/2017 1350   NA 135 05/12/2014 1343   K 3.1 (L) 11/22/2017 1350   K 3.3 (L) 05/12/2014 1343   CL 95 (L) 11/22/2017 1350   CL 101 05/12/2014 1343   CO2 32 11/22/2017 1350   CO2 27 05/12/2014 1343   BUN 12 11/22/2017 1350   BUN 9 05/12/2014 1343   CREATININE 0.68 11/22/2017 1350   CREATININE 0.89 05/12/2014 1343      Component Value Date/Time   CALCIUM 9.2 11/22/2017 1350   CALCIUM 8.8 (L) 05/12/2014 1343   ALKPHOS 31 (L) 11/22/2017 1350   ALKPHOS 44 (L) 02/17/2014 1405   AST 18 11/22/2017 1350   AST 9 (L) 02/17/2014 1405   ALT 8 11/22/2017 1350   ALT 12 (L) 02/17/2014 1405   BILITOT 0.6 11/22/2017 1350   BILITOT 0.2 02/17/2014 1405       RADIOGRAPHIC STUDIES: I have personally reviewed the radiological images as listed and agreed with the findings in the report. No results found.   ASSESSMENT & PLAN:  Multiple myeloma in relapse ([place]) # MULTIPLE MYELOMA-IgG kappa;M protein=0.6; kappa light chains 570/elevated .AUG 2019-  Bone survey shows progressive lesions in the bilateral humeri/femur.Oct 2019- PET- no lytic lesions noted.    # Currently on Ninlaro-Dex s/p 3 cycles- PR noted; Kappa light chains- 11; M protein= 0.2 gm/dl.   # Diarrhea/myalgia- 1 week; 2-3/day; [sick contact]; improving.  #  Osteonecrosis of the jaw- STABLE.  Denosumab discontinued.  # Bil scattered lung nodules- up to 5 mm-  Repeat CT in 6 months.   # chronic pain/anxiety-STABLE; - on fentanyl [152mg] q48 hours; and Dilaudid 2 mg every 4 hours- new script sent Xanax.  # COPD- currently wheezing; stable.  Continue nebs at home..   # Chronic nausea-question secondary to Ninlaro.  Continue Zofran as needed.  Prescription given.  DISPOSITION;  # follow up in 4 weeks/labs-  cbc/cmp/MM panel;K-l light chains-Dr.B     No orders of the defined types were placed in this encounter.  All  questions were answered. The patient knows to call the clinic with any problems, questions or concerns.      GCammie Sickle MD 12/04/2017 1:19 PM

## 2017-11-28 NOTE — Assessment & Plan Note (Addendum)
#  MULTIPLE MYELOMA-IgG kappa;M protein=0.6; kappa light chains 570/elevated .AUG 2019-  Bone survey shows progressive lesions in the bilateral humeri/femur.Oct 2019- PET- no lytic lesions noted.    # Currently on Ninlaro-Dex s/p 3 cycles- PR noted; Kappa light chains- 11; M protein= 0.2 gm/dl.   # Diarrhea/myalgia- 1 week; 2-3/day; [sick contact]; improving.  #  Osteonecrosis of the jaw- STABLE.  Denosumab discontinued.  # Bil scattered lung nodules- up to 5 mm-  Repeat CT in 6 months.   # chronic pain/anxiety-STABLE; - on fentanyl [144mg] q48 hours; and Dilaudid 2 mg every 4 hours- new script sent Xanax.  # COPD- currently wheezing; stable.  Continue nebs at home.  # Chronic nausea-question secondary to Ninlaro.  Continue Zofran as needed.  Prescription given.  DISPOSITION;  # follow up in 4 weeks/labs-  cbc/cmp/MM panel;K-l light chains-Dr.B

## 2017-12-11 ENCOUNTER — Other Ambulatory Visit: Payer: Self-pay | Admitting: *Deleted

## 2017-12-11 ENCOUNTER — Telehealth: Payer: Self-pay | Admitting: Internal Medicine

## 2017-12-11 DIAGNOSIS — C9000 Multiple myeloma not having achieved remission: Secondary | ICD-10-CM

## 2017-12-11 DIAGNOSIS — F419 Anxiety disorder, unspecified: Secondary | ICD-10-CM

## 2017-12-11 MED ORDER — HYDROMORPHONE HCL 2 MG PO TABS: 2.0000 mg | ORAL_TABLET | ORAL | 0 refills | Status: DC | PRN

## 2017-12-11 MED ORDER — ALPRAZOLAM 0.5 MG PO TABS: 0.5000 mg | ORAL_TABLET | Freq: Three times a day (TID) | ORAL | 1 refills | Status: DC | PRN

## 2017-12-11 NOTE — Telephone Encounter (Signed)
Spoke to fiance, scheduled for acute visit.

## 2017-12-11 NOTE — Telephone Encounter (Signed)
Needs to be evaluated.

## 2017-12-11 NOTE — Telephone Encounter (Signed)
Pt fiance calling stating their son who had viral Bronchitis and now has the whole family sick She is calling asking if there is anything we can call in for patient They have done his breathing treatments and nothing is working   Please call back

## 2017-12-11 NOTE — Telephone Encounter (Signed)
Patient called cancer center requesting refill of Xanax 0.5 mg TID for anxiety and Dilaudid 2 mg q 4 hours PRN.   As mandated by the [place] STOP Act (Strengthen Opioid Misuse Prevention), the [place] Controlled Substance Reporting System ([place]) was reviewed for this patient.  Below is the past 73-months of controlled substance prescriptions as displayed by the registry.  I have personally consulted with my supervising physician, Dr. Rogue Bussing, who agrees that continuation of opiate therapy is medically appropriate at this time and agrees to provide continual monitoring, including urine/blood drug screens, as indicated. Refill is appropriate for Xanax 0.5 mg TID on 12/16/17 and for Dilaudid 2 mg q 4 hours on 12/12/17.   Per narcotic registry, patient was prescribed Xanax on 10/18/2017 and it was not filled until 11/17/2017.   NCCSRS reviewed:      Faythe Casa, NP 12/11/2017 10:55 AM [phone]

## 2017-12-12 ENCOUNTER — Ambulatory Visit (INDEPENDENT_AMBULATORY_CARE_PROVIDER_SITE_OTHER): Payer: Medicaid Other | Admitting: Internal Medicine

## 2017-12-12 ENCOUNTER — Encounter: Payer: Self-pay | Admitting: Internal Medicine

## 2017-12-12 VITALS — BP 92/60 | HR 110 | Resp 16 | Ht 72.0 in | Wt 134.0 lb

## 2017-12-12 DIAGNOSIS — J441 Chronic obstructive pulmonary disease with (acute) exacerbation: Secondary | ICD-10-CM

## 2017-12-12 MED ORDER — AZITHROMYCIN 250 MG PO TABS: ORAL_TABLET | ORAL | 0 refills | Status: DC

## 2017-12-12 MED ORDER — PREDNISONE 20 MG PO TABS: 40.0000 mg | ORAL_TABLET | Freq: Every day | ORAL | 0 refills | Status: DC

## 2017-12-12 MED ORDER — GUAIFENESIN-CODEINE 100-10 MG/5ML PO SOLN: 5.0000 mL | ORAL | 0 refills | Status: DC | PRN

## 2017-12-12 NOTE — Patient Instructions (Signed)
Start Prednisone 40 mg daily for 10 days  Start Z pak  Cough syrup as needed

## 2017-12-12 NOTE — Progress Notes (Signed)
Name: [name] MRN:   [id number] DOB:   1960/09/15          consult date 3.18.19   [name] follow-up patient seen by Dr[name]  BRIEF PATIENT DESCRIPTION:  58 yo male with known history of Diastolic Heart failure, COPD and Multiple Myeloma admitted with acute encephalopathy, nausea/vomiting, acute on chronic hypoxic respiratory failure secondary to possible aspiration pneumonia and AECOPD  CXR 05/23/16 I have Independently reviewed images of CXR   Interpretation:no acutue process, no effusions  SIGNIFICANT EVENTS  5/1 Patient admitted to the ICU with increased sob requiring NRB  CC acute SOB and wheezing  HPI Patient has chronic shortness of breath and dyspnea on exertion Patient exposed to sick contacts  Patient has increased shortness of breath and wheezing patient uses oxygen therapy 24/7 3 L nasal cannula  Patient uses and benefits from oxygen therapy He needs this to survive  Continues to smoke daily Did not tolerate Chantix or nicotine patches  + signs of infection +COPD exacerbation Ongoing wheezing    Smoking Assessment and Cessation Counseling   Upon further questioning, Patient smokes 1 pdd  I have advised patient to quit/stop smoking as soon as possible due to high risk for multiple medical problems  Patient  is NOT willing to quit smoking  I have advised patient that we can assist and have options of Nicotine replacement therapy. I also advised patient on behavioral therapy and can provide oral medication therapy in conjunction with the other therapies  Follow up next Office visit  for assessment of smoking cessation  Smoking cessation counseling advised for 4 minutes     Review of Systems:  Gen:  Denies  fever, sweats, chills weigh loss  HEENT: Denies blurred vision, double vision, ear pain, eye pain, hearing loss, nose bleeds, sore throat Cardiac:  No dizziness, chest pain or heaviness, chest tightness,edema, No  JVD Resp:   + cough, +sputum production, +shortness of breath,+wheezing, -hemoptysis,  Gi: Denies swallowing difficulty, stomach pain, nausea or vomiting, diarrhea, constipation, bowel incontinence Gu:  Denies bladder incontinence, burning urine Ext:   Denies Joint pain, stiffness or swelling Skin: Denies  skin rash, easy bruising or bleeding or hives Endoc:  Denies polyuria, polydipsia , polyphagia or weight change Psych:   Denies depression, insomnia or hallucinations  Other:  All other systems negative  Resp 16   Ht 6' (1.829 m)   Wt 134 lb (60.8 kg)   BMI 18.17 kg/m  BP 92/60 (BP Location: Left Arm, Cuff Size: Normal)   Pulse (!) 110   Resp 16   Ht 6' (1.829 m)   Wt 134 lb (60.8 kg)   SpO2 95%   BMI 18.17 kg/m    Physical Examination:   GENERAL:NAD, no fevers, chills, no weakness no fatigue HEAD: Normocephalic, atraumatic.  EYES: Pupils equal, round, reactive to light. Extraocular muscles intact. No scleral icterus.  MOUTH: Moist mucosal membrane. Dentition intact. No abscess noted.  EAR, NOSE, THROAT: Clear without exudates. No external lesions.  NECK: Supple. No thyromegaly. No nodules. No JVD.  PULMONARY: CTA B/L +wheezing, rhonchi, crackles CARDIOVASCULAR: S1 and S2. Regular rate and rhythm. No murmurs, rubs, or gallops. No edema. Pedal pulses 2+ bilaterally.  GASTROINTESTINAL: Soft, nontender, nondistended. No masses. Positive bowel sounds. No hepatosplenomegaly.  MUSCULOSKELETAL: No swelling, clubbing, or edema. Range of motion full in all extremities.  NEUROLOGIC: Cranial nerves II through XII are intact. No gross focal neurological deficits. Sensation intact. Reflexes intact.  SKIN:  No ulceration, lesions, rashes, or cyanosis. Skin warm and dry. Turgor intact.  PSYCHIATRIC: Mood, affect within normal limits. The patient is awake, alert and oriented x 3. Insight, judgment intact.  ALL OTHER ROS ARE NEGATIVE         ASSESSMENT / PLAN: 57 year old  ill-appearing thin white male with end-stage COPD Gold stage D with progressive chronic hypoxic respiratory failure from his COPD in the setting of pulmonary cachexia and severe deconditioned state with ongoing tobacco abuse with underlying multiple myeloma  Shortness of breath and dyspnea on exertion with ongoing wheezing Underlying COPD and ongoing tobacco abuse  COPD exacerbation Start prednisone 40 mg daily for 10 days Start antibiotics with Z-Pak Cough syrup as needed  COPD end-stage severe Gold stage D Continue inhalers as per Continue Advair and Spiriva There may be a point time that he will need to switch to nebulized therapy to support his respiratory status due to severe respiratory insufficiency  Hypoxic respiratory failure from COPD Continue oxygen as per patient uses and benefits from oxygen daily and nightly Patient needs oxygen to survive   Cough related to COPD and smoking Smoking cessation of most Continue inhalers for COPD   Tobacco abuse Smoking cessation strongly advised Patient does not tolerate Chantix or nicotine patches because it gives him nightmares  Multiple myeloma follow-up oncology as scheduled     [name], M.D.  [name]  Medical Director [name]

## 2017-12-19 ENCOUNTER — Other Ambulatory Visit: Payer: Self-pay | Admitting: *Deleted

## 2017-12-19 MED ORDER — FENTANYL 25 MCG/HR TD PT72: 25.0000 ug | MEDICATED_PATCH | TRANSDERMAL | 0 refills | Status: DC

## 2017-12-19 MED ORDER — FENTANYL 100 MCG/HR TD PT72: 100.0000 ug | MEDICATED_PATCH | TRANSDERMAL | 0 refills | Status: DC

## 2017-12-19 NOTE — Telephone Encounter (Signed)
-----   Message from Shawnee Knapp, RN sent at 12/19/2017 11:19 AM EST ----- Regarding: Fentanyl Patch Refill Requesting refill for Fentanyl patches-

## 2017-12-26 ENCOUNTER — Encounter: Payer: Self-pay | Admitting: Internal Medicine

## 2017-12-26 ENCOUNTER — Other Ambulatory Visit: Payer: Self-pay

## 2017-12-26 ENCOUNTER — Inpatient Hospital Stay: Payer: Medicaid Other | Attending: Internal Medicine

## 2017-12-26 ENCOUNTER — Inpatient Hospital Stay (HOSPITAL_BASED_OUTPATIENT_CLINIC_OR_DEPARTMENT_OTHER): Payer: Medicaid Other | Admitting: Internal Medicine

## 2017-12-26 VITALS — BP 93/61 | HR 108 | Temp 98.2°F | Resp 20 | Ht 72.0 in | Wt 138.0 lb

## 2017-12-26 DIAGNOSIS — J449 Chronic obstructive pulmonary disease, unspecified: Secondary | ICD-10-CM

## 2017-12-26 DIAGNOSIS — I252 Old myocardial infarction: Secondary | ICD-10-CM

## 2017-12-26 DIAGNOSIS — Z7982 Long term (current) use of aspirin: Secondary | ICD-10-CM | POA: Diagnosis not present

## 2017-12-26 DIAGNOSIS — C9000 Multiple myeloma not having achieved remission: Secondary | ICD-10-CM

## 2017-12-26 DIAGNOSIS — Z95810 Presence of automatic (implantable) cardiac defibrillator: Secondary | ICD-10-CM | POA: Diagnosis not present

## 2017-12-26 DIAGNOSIS — F17210 Nicotine dependence, cigarettes, uncomplicated: Secondary | ICD-10-CM

## 2017-12-26 DIAGNOSIS — F419 Anxiety disorder, unspecified: Secondary | ICD-10-CM | POA: Diagnosis not present

## 2017-12-26 DIAGNOSIS — C9002 Multiple myeloma in relapse: Secondary | ICD-10-CM | POA: Insufficient documentation

## 2017-12-26 DIAGNOSIS — Z79899 Other long term (current) drug therapy: Secondary | ICD-10-CM

## 2017-12-26 LAB — COMPREHENSIVE METABOLIC PANEL
ALBUMIN: 3.4 g/dL — AB (ref 3.5–5.0)
ALT: 8 U/L (ref 0–44)
AST: 15 U/L (ref 15–41)
Alkaline Phosphatase: 31 U/L — ABNORMAL LOW (ref 38–126)
Anion gap: 10 (ref 5–15)
BILIRUBIN TOTAL: 0.6 mg/dL (ref 0.3–1.2)
BUN: 12 mg/dL (ref 6–20)
CHLORIDE: 94 mmol/L — AB (ref 98–111)
CO2: 33 mmol/L — ABNORMAL HIGH (ref 22–32)
Calcium: 8.8 mg/dL — ABNORMAL LOW (ref 8.9–10.3)
Creatinine, Ser: 0.97 mg/dL (ref 0.61–1.24)
GFR calc Af Amer: >60 mL/min (ref >60)
GFR calc non Af Amer: >60 mL/min (ref >60)
GLUCOSE: 148 mg/dL — AB (ref 70–99)
POTASSIUM: 3.3 mmol/L — AB (ref 3.5–5.1)
Sodium: 137 mmol/L (ref 135–145)
Total Protein: 6.8 g/dL (ref 6.5–8.1)

## 2017-12-26 LAB — CBC WITH DIFFERENTIAL/PLATELET
ABS IMMATURE GRANULOCYTES: 0.07 10*3/uL (ref 0.00–0.07)
BASOS PCT: 0 %
Basophils Absolute: 0 10*3/uL (ref 0.0–0.1)
EOS ABS: 0 10*3/uL (ref 0.0–0.5)
Eosinophils Relative: 0 %
HEMATOCRIT: 33.8 % — AB (ref 39.0–52.0)
Hemoglobin: 10.8 g/dL — ABNORMAL LOW (ref 13.0–17.0)
Immature Granulocytes: 1 %
Lymphocytes Relative: 2 %
Lymphs Abs: 0.2 10*3/uL — ABNORMAL LOW (ref 0.7–4.0)
MCH: 28.3 pg (ref 26.0–34.0)
MCHC: 32 g/dL (ref 30.0–36.0)
MCV: 88.7 fL (ref 80.0–100.0)
MONO ABS: 0.1 10*3/uL (ref 0.1–1.0)
MONOS PCT: 1 %
Neutro Abs: 7.6 10*3/uL (ref 1.7–7.7)
Neutrophils Relative %: 96 %
Platelets: 159 10*3/uL (ref 150–400)
RBC: 3.81 MIL/uL — ABNORMAL LOW (ref 4.22–5.81)
RDW: 13.4 % (ref 11.5–15.5)
WBC: 8 10*3/uL (ref 4.0–10.5)
nRBC: 0 % (ref 0.0–0.2)

## 2017-12-26 MED ORDER — HYDROMORPHONE HCL 2 MG PO TABS: 2.0000 mg | ORAL_TABLET | ORAL | 0 refills | Status: DC | PRN

## 2017-12-26 NOTE — Progress Notes (Signed)
[place] OFFICE PROGRESS NOTE  Patient Care Team: Petra Kuba, MD as PCP - General (Family Medicine)  Cancer Staging No matching staging information was found for the patient.   Oncology History   # SEP 2015- MULTIPLE MYELOMA [multiple PET pos Bone lesions; hypercalcemia s/p BMBx; FISH- Aneuploidy - gain of chromosome 7,9,15,FGFR3/4p16.3, and CCND1/11q13. Loss of MAF/16q23.1), cytogenetics normal 46XY] s/p Vel-Dex-Rev-Zometa; Excellent PR;   # MARCH 2016- REV-DEX ; FEB 2017-[discn Dex] Rev 25 mg 3 w On & 1 w Off; March 2017- M- protein 0.4gm/dl; K/L= 7.9; cont Rev;   # Revlimid HELD June 2018 [sec to [facility]]  # AUG 2019- RECURRENCE- NINLARO-DEX  # March 2017-  Left Middle Lobe cavitary lesion- ~29m- repeat Ct in 3-463m# AUG 2017- [facility] [[name], [place] #  ? [facility]- [id number] not follow up ]- DISCONT Zometa.; NO HBO sec to   # Chronic pain/Anxiety [ortho]  # COPD/smoking/ [[facility]- not candidate for BMT- sec to co-morbidities/poor nutritional status];   --------------------------------------------------------------------   DIAGNOSIS: MULTIPLE MYELOMA  STAGE: RECURRENT  ;GOALS: CONTROL  CURRENT/MOST RECENT THERAPY; AUG 2019- NINLARO-DEX      [place]     INTERVAL HISTORY:  [name] [age].o.  male [facility] above history of recurrent/relapse multiple myeloma-currently on Ninlaro is here for follow-up.  Patient admits to improved diarrhea.  Chronic mild nausea without any vomiting.  His pain is stable.  Continues to be on fentanyl patches and also Dilaudid every 4 hours.  Chronic shortness of breath chronic cough.  Not any worse.  Review of Systems  Constitutional: Positive for malaise/fatigue. Negative for chills, diaphoresis and fever.  HENT: Negative for nosebleeds and sore throat.   Eyes: Negative for double vision.  Respiratory: Positive for cough, shortness of breath and wheezing. Negative for  hemoptysis and sputum production.   Cardiovascular: Negative for chest pain, palpitations, orthopnea and leg swelling.  Gastrointestinal: Positive for nausea. Negative for abdominal pain, blood in stool, constipation, diarrhea, heartburn, melena and vomiting.  Genitourinary: Negative for dysuria, frequency and urgency.  Musculoskeletal: Positive for back pain (worse) and joint pain.  Skin: Negative.  Negative for itching and rash.  Neurological: Negative for dizziness, tingling, focal weakness, weakness and headaches.  Endo/Heme/Allergies: Does not bruise/bleed easily.  Psychiatric/Behavioral: Negative for depression. The patient is not nervous/anxious and does not have insomnia.       PAST MEDICAL HISTORY :  Past Medical History:  Diagnosis Date  . Anxiety   . Arthritis   . Atrial septal defect   . CHF (congestive heart failure) ([facility]  . COPD (chronic obstructive pulmonary disease) ([facility]  . Coronary artery disease   . Dysrhythmias   . GERD (gastroesophageal reflux disease)   . Hyperlipidemia   . [facility]oma ([facility]  . Multiple myeloma ([facility]  . [facility] ([facility]  . Substance abuse ([facility]    PAST SURGICAL HISTORY :   Past Surgical History:  Procedure Laterality Date  . CARDIAC DEFIBRILLATOR PLACEMENT      FAMILY HISTORY :   Family History  Problem Relation Age of Onset  . COPD Mother   . CAD Mother   . Heart attack Father   . Prostate cancer Maternal Grandfather   . Kidney cancer Neg Hx   . Bladder Cancer Neg Hx     SOCIAL HISTORY:   Social History   Tobacco Use  . Smoking status: Current Every Day  Smoker    Packs/day: 0.50    Years: 40.00    Pack years: 20.00    Types: Cigarettes  . Smokeless tobacco: Never Used  Substance Use Topics  . Alcohol use: No    Alcohol/week: 0.0 standard drinks  . Drug use: No    ALLERGIES:  has No Known Allergies.  MEDICATIONS:  Current Outpatient Medications  Medication Sig Dispense Refill  .  acetaminophen (TYLENOL) 325 MG tablet Take 2 tablets (650 mg total) by mouth every 6 (six) hours as needed for mild pain (or Fever >/= 101).    Marland Kitchen albuterol (PROVENTIL HFA;VENTOLIN HFA) 108 (90 Base) MCG/ACT inhaler Inhale 2 puffs into the lungs every 6 (six) hours as needed for wheezing or shortness of breath. 1 Inhaler 6  . ALPRAZolam (XANAX) 0.5 MG tablet Take 1 tablet (0.5 mg total) by mouth 3 (three) times daily as needed for anxiety. 90 tablet 1  . aspirin 325 MG EC tablet Take 325 mg by mouth daily.    Marland Kitchen atorvastatin (LIPITOR) 20 MG tablet Take 20 mg by mouth daily.  2  . dexamethasone (DECADRON) 4 MG tablet Take 3 tablets (12 mg total) by mouth once a week. Take with breakfast 12 tablet 4  . DULoxetine (CYMBALTA) 30 MG capsule TAKE ONE CAPSULE BY MOUTH EVERY DAY 90 capsule 1  . feeding supplement, ENSURE ENLIVE, (ENSURE ENLIVE) LIQD Take 237 mLs by mouth 3 (three) times daily between meals. 90 Bottle 1  . fentaNYL (DURAGESIC - DOSED MCG/HR) 100 MCG/HR Place 1 patch (100 mcg total) onto the skin every other day. Use with a 25 mcg patch for total dose of 125 mcg 15 patch 0  . fentaNYL (DURAGESIC - DOSED MCG/HR) 25 MCG/HR patch Place 1 patch (25 mcg total) onto the skin every other day. Use with a 100 mcg patch for total dose of 125 mcg 15 patch 0  . Fluticasone-Salmeterol (ADVAIR DISKUS) 250-50 MCG/DOSE AEPB Inhale 1 puff into the lungs 2 (two) times daily. 3 each 2  . furosemide (LASIX) 20 MG tablet Take 20 mg by mouth daily.  0  . gabapentin (NEURONTIN) 100 MG capsule Take 2 capsules (200 mg total) by mouth 3 (three) times daily. 180 capsule 3  . guaiFENesin-codeine 100-10 MG/5ML syrup Take 5 mLs by mouth every 4 (four) hours as needed for cough. 120 mL 0  . HYDROmorphone (DILAUDID) 2 MG tablet Take 1 tablet (2 mg total) by mouth every 4 (four) hours as needed for severe pain. 84 tablet 0  . ipratropium-albuterol (DUONEB) 0.5-2.5 (3) MG/3ML SOLN Take 3 mLs by nebulization every 4 (four) hours  as needed. 360 mL 3  . ixazomib citrate (NINLARO) 4 MG capsule Take 1 capsule (4 mg total) by mouth once a week. Take for 3 weeks on, then 1 week off. Take on empty stomach 1hr before or 2hrs after food. 3 capsule 4  . megestrol (MEGACE) 20 MG tablet TAKE ONE TABLET BY MOUTH ONCE DAILY 30 tablet 2  . metoprolol tartrate (LOPRESSOR) 50 MG tablet Take 50 mg by mouth 2 (two) times daily.  2  . ondansetron (ZOFRAN) 8 MG tablet 1 pill every 8 hours as needed. 40 tablet 0  . OXYGEN Inhale 3 L into the lungs daily.    . polyethylene glycol (MIRALAX / GLYCOLAX) packet Take 17 g by mouth daily. 30 each 0  . tiotropium (SPIRIVA HANDIHALER) 18 MCG inhalation capsule Place 1 capsule (18 mcg total) into inhaler and inhale daily. 30 capsule  6   No current facility-administered medications for this visit.    Facility-Administered Medications Ordered in Other Visits  Medication Dose Route Frequency Provider Last Rate Last Dose  . 0.9 %  sodium chloride infusion   Intravenous Continuous Cammie Sickle, MD 10 mL/hr at 02/24/15 1520      PHYSICAL EXAMINATION: ECOG PERFORMANCE STATUS: 2 - Symptomatic, <50% confined to bed  BP 93/61 (BP Location: Left Arm, Patient Position: Sitting)   Pulse (!) 108 Comment: Manual 106  Temp 98.2 F (36.8 C) (Oral)   Resp 20   Ht 6' (1.829 m)   Wt 138 lb (62.6 kg)   SpO2 95% Comment: 3L Oxygen  BMI 18.72 kg/m   Filed Weights   12/26/17 1532  Weight: 138 lb (62.6 kg)    Physical Exam  Constitutional: He is oriented to person, place, and time.  Cachectic appearing Caucasian male patient; 2 L nasal cannula.  Is alone.  In a wheelchair.  HENT:  Head: Normocephalic and atraumatic.  Mouth/Throat: Oropharynx is clear and moist. No oropharyngeal exudate.  Eyes: Pupils are equal, round, and reactive to light.  Neck: Normal range of motion. Neck supple.  Cardiovascular: Normal rate and regular rhythm.  Pulmonary/Chest: No respiratory distress. He has wheezes.   Bilateral decreased air entry.  Abdominal: Soft. Bowel sounds are normal. He exhibits no distension and no mass. There is no tenderness. There is no rebound and no guarding.  Musculoskeletal: Normal range of motion. He exhibits no edema or tenderness.  Neurological: He is alert and oriented to person, place, and time.  Skin: Skin is warm.  Psychiatric: Affect normal.       LABORATORY DATA:  I have reviewed the data as listed    Component Value Date/Time   NA 137 12/26/2017 1423   NA 135 05/12/2014 1343   K 3.3 (L) 12/26/2017 1423   K 3.3 (L) 05/12/2014 1343   CL 94 (L) 12/26/2017 1423   CL 101 05/12/2014 1343   CO2 33 (H) 12/26/2017 1423   CO2 27 05/12/2014 1343   GLUCOSE 148 (H) 12/26/2017 1423   GLUCOSE 77 05/12/2014 1343   BUN 12 12/26/2017 1423   BUN 9 05/12/2014 1343   CREATININE 0.97 12/26/2017 1423   CREATININE 0.89 05/12/2014 1343   CALCIUM 8.8 (L) 12/26/2017 1423   CALCIUM 8.8 (L) 05/12/2014 1343   PROT 6.8 12/26/2017 1423   PROT 5.9 (L) 02/17/2014 1405   ALBUMIN 3.4 (L) 12/26/2017 1423   ALBUMIN 2.8 (L) 02/17/2014 1405   AST 15 12/26/2017 1423   AST 9 (L) 02/17/2014 1405   ALT 8 12/26/2017 1423   ALT 12 (L) 02/17/2014 1405   ALKPHOS 31 (L) 12/26/2017 1423   ALKPHOS 44 (L) 02/17/2014 1405   BILITOT 0.6 12/26/2017 1423   BILITOT 0.2 02/17/2014 1405   GFRNONAA >60 12/26/2017 1423   GFRNONAA >60 05/12/2014 1343   GFRAA >60 12/26/2017 1423   GFRAA >60 05/12/2014 1343    No results found for: SPEP, UPEP  Lab Results  Component Value Date   WBC 8.0 12/26/2017   NEUTROABS 7.6 12/26/2017   HGB 10.8 (L) 12/26/2017   HCT 33.8 (L) 12/26/2017   MCV 88.7 12/26/2017   PLT 159 12/26/2017      Chemistry      Component Value Date/Time   NA 137 12/26/2017 1423   NA 135 05/12/2014 1343   K 3.3 (L) 12/26/2017 1423   K 3.3 (L) 05/12/2014 1343   CL 94 (  L) 12/26/2017 1423   CL 101 05/12/2014 1343   CO2 33 (H) 12/26/2017 1423   CO2 27 05/12/2014 1343   BUN  12 12/26/2017 1423   BUN 9 05/12/2014 1343   CREATININE 0.97 12/26/2017 1423   CREATININE 0.89 05/12/2014 1343      Component Value Date/Time   CALCIUM 8.8 (L) 12/26/2017 1423   CALCIUM 8.8 (L) 05/12/2014 1343   ALKPHOS 31 (L) 12/26/2017 1423   ALKPHOS 44 (L) 02/17/2014 1405   AST 15 12/26/2017 1423   AST 9 (L) 02/17/2014 1405   ALT 8 12/26/2017 1423   ALT 12 (L) 02/17/2014 1405   BILITOT 0.6 12/26/2017 1423   BILITOT 0.2 02/17/2014 1405       RADIOGRAPHIC STUDIES: I have personally reviewed the radiological images as listed and agreed with the findings in the report. No results found.   ASSESSMENT & PLAN:  Multiple myeloma in relapse ([place]) # MULTIPLE MYELOMA-AUG 2019-IgG kappa;M protein=0.6; kappa light chains 570/elevated. AUG 2019-  Bone survey shows progressive lesions in the bilateral humeri/femur. Oct 2019- PET- no lytic lesions noted.    # Currently on Ninlaro-Dex [start again 12/11]- PR noted; October 2019 -Kappa light chains- 11; M protein= 0.2 gm/dl.   #  Osteonecrosis of the jaw- STABLE. Denosumab discontinued.  #October 2019 PET scan Bil scattered lung nodules- up to 5 mm-  Repeat CT in 6 months/February 2020 stable.  # chronic pain/anxiety-STABLE; - on fentanyl [164mg] q48 hours; and Dilaudid 2 mg every 4 hours. STABLE.   # COPD- STABLE.   # Chronic nausea-question secondary to Ninlaro.  Continue Zofran as needed. STABLE.   DISPOSITION;  # follow up in 5 weeks/labs-  cbc/cmp/MM panel;K-l light chains-Dr.B     Orders Placed This Encounter  Procedures  . CBC with Differential/Platelet    Standing Status:   Future    Standing Expiration Date:   12/27/2018  . Comprehensive metabolic panel    Standing Status:   Future    Standing Expiration Date:   12/27/2018  . Kappa/lambda light chains    Standing Status:   Future    Standing Expiration Date:   12/27/2018  . Multiple Myeloma Panel (SPEP&IFE w/QIG)    Standing Status:   Future    Standing Expiration  Date:   12/27/2018   All questions were answered. The patient knows to call the clinic with any problems, questions or concerns.      GCammie Sickle MD 12/26/2017 4:09 PM

## 2017-12-26 NOTE — Assessment & Plan Note (Addendum)
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 AUG 2019-  Bone survey shows progressive lesions in the bilateral humeri/femur. Oct 2019- PET- no lytic lesions noted.    # Currently on Ninlaro-Dex [start again 12/11]- PR noted; October 2019 -Kappa light chains- 11; M protein= 0.2 gm/dl.   #  Osteonecrosis of the jaw- STABLE. Denosumab discontinued.  #October 2019 PET scan Bil scattered lung nodules- up to 5 mm-  Repeat CT in 6 months/February 2020 stable.  # chronic pain/anxiety-STABLE; - on fentanyl [157mg] q48 hours; and Dilaudid 2 mg every 4 hours. STABLE.   # COPD- STABLE.   # Chronic nausea-question secondary to Ninlaro.  Continue Zofran as needed. STABLE.   DISPOSITION;  # follow up in 5 weeks/labs-  cbc/cmp/MM panel;K-l light chains-Dr.B

## 2017-12-27 LAB — KAPPA/LAMBDA LIGHT CHAINS
KAPPA, LAMDA LIGHT CHAIN RATIO: 1.66 — AB (ref 0.26–1.65)
Kappa free light chain: 14.4 mg/L (ref 3.3–19.4)
LAMDA FREE LIGHT CHAINS: 8.7 mg/L (ref 5.7–26.3)

## 2017-12-27 MED FILL — DEXAMETHASONE 4 MG TABLET: 4 | 28 days supply | Qty: 12 | Fill #4

## 2017-12-27 MED FILL — NINLARO 4 MG CAP: 4 | 28 days supply | Qty: 3 | Fill #4

## 2017-12-29 LAB — MULTIPLE MYELOMA PANEL, SERUM
ALBUMIN/GLOB SERPL: 1.2 (ref 0.7–1.7)
ALPHA2 GLOB SERPL ELPH-MCNC: 1 g/dL (ref 0.4–1.0)
Albumin SerPl Elph-Mcnc: 3.2 g/dL (ref 2.9–4.4)
Alpha 1: 0.3 g/dL (ref 0.0–0.4)
B-Globulin SerPl Elph-Mcnc: 0.9 g/dL (ref 0.7–1.3)
GAMMA GLOB SERPL ELPH-MCNC: 0.6 g/dL (ref 0.4–1.8)
Globulin, Total: 2.8 g/dL (ref 2.2–3.9)
IGG (IMMUNOGLOBIN G), SERUM: 903 mg/dL (ref 700–1600)
IGM (IMMUNOGLOBULIN M), SRM: 36 mg/dL (ref 20–172)
IgA: 93 mg/dL (ref 90–386)
M Protein SerPl Elph-Mcnc: 0.3 g/dL — ABNORMAL HIGH
Total Protein ELP: 6 g/dL (ref 6.0–8.5)

## 2018-01-03 ENCOUNTER — Other Ambulatory Visit: Payer: Self-pay

## 2018-01-03 ENCOUNTER — Emergency Department: Payer: Medicaid Other

## 2018-01-03 ENCOUNTER — Encounter: Payer: Self-pay | Admitting: Emergency Medicine

## 2018-01-03 ENCOUNTER — Emergency Department
Admission: EM | Admit: 2018-01-03 | Discharge: 2018-01-03 | Disposition: A | Discharge status: Home / Self Care | Payer: Medicaid Other | Attending: Emergency Medicine | Admitting: Emergency Medicine

## 2018-01-03 DIAGNOSIS — C9000 Multiple myeloma not having achieved remission: Secondary | ICD-10-CM | POA: Diagnosis not present

## 2018-01-03 DIAGNOSIS — R4182 Altered mental status, unspecified: Secondary | ICD-10-CM | POA: Diagnosis not present

## 2018-01-03 DIAGNOSIS — R531 Weakness: Secondary | ICD-10-CM | POA: Diagnosis present

## 2018-01-03 DIAGNOSIS — I259 Chronic ischemic heart disease, unspecified: Secondary | ICD-10-CM | POA: Diagnosis not present

## 2018-01-03 DIAGNOSIS — I509 Heart failure, unspecified: Secondary | ICD-10-CM | POA: Insufficient documentation

## 2018-01-03 DIAGNOSIS — Z7982 Long term (current) use of aspirin: Secondary | ICD-10-CM | POA: Insufficient documentation

## 2018-01-03 DIAGNOSIS — F17210 Nicotine dependence, cigarettes, uncomplicated: Secondary | ICD-10-CM | POA: Insufficient documentation

## 2018-01-03 DIAGNOSIS — J441 Chronic obstructive pulmonary disease with (acute) exacerbation: Secondary | ICD-10-CM | POA: Diagnosis not present

## 2018-01-03 DIAGNOSIS — Z79899 Other long term (current) drug therapy: Secondary | ICD-10-CM | POA: Diagnosis not present

## 2018-01-03 LAB — URINALYSIS, COMPLETE (UACMP) WITH MICROSCOPIC
BACTERIA UA: NONE SEEN
BILIRUBIN URINE: NEGATIVE
Glucose, UA: NEGATIVE mg/dL
Hgb urine dipstick: NEGATIVE
Ketones, ur: NEGATIVE mg/dL
LEUKOCYTES UA: NEGATIVE
NITRITE: NEGATIVE
Protein, ur: NEGATIVE mg/dL
SPECIFIC GRAVITY, URINE: 1.01 (ref 1.005–1.030)
pH: 6 (ref 5.0–8.0)

## 2018-01-03 LAB — BLOOD GAS, ARTERIAL
Acid-Base Excess: 5 mmol/L — ABNORMAL HIGH (ref 0.0–2.0)
Bicarbonate: 31 mmol/L — ABNORMAL HIGH (ref 20.0–28.0)
FIO2: 0.32
O2 Saturation: 97.7 %
Patient temperature: 37
pCO2 arterial: 50 mmHg — ABNORMAL HIGH (ref 32.0–48.0)
pH, Arterial: 7.4 (ref 7.350–7.450)
pO2, Arterial: 100 mmHg (ref 83.0–108.0)

## 2018-01-03 LAB — CBC WITH DIFFERENTIAL/PLATELET
ABS IMMATURE GRANULOCYTES: 0.06 10*3/uL (ref 0.00–0.07)
BASOS ABS: 0 10*3/uL (ref 0.0–0.1)
BASOS PCT: 0 %
EOS PCT: 0 %
Eosinophils Absolute: 0 10*3/uL (ref 0.0–0.5)
HEMATOCRIT: 31.8 % — AB (ref 39.0–52.0)
Hemoglobin: 10.1 g/dL — ABNORMAL LOW (ref 13.0–17.0)
Immature Granulocytes: 1 %
LYMPHS ABS: 1.3 10*3/uL (ref 0.7–4.0)
LYMPHS PCT: 15 %
MCH: 28.7 pg (ref 26.0–34.0)
MCHC: 31.8 g/dL (ref 30.0–36.0)
MCV: 90.3 fL (ref 80.0–100.0)
MONOS PCT: 12 %
Monocytes Absolute: 1 10*3/uL (ref 0.1–1.0)
NEUTROS ABS: 6.5 10*3/uL (ref 1.7–7.7)
NRBC: 0 % (ref 0.0–0.2)
Neutrophils Relative %: 72 %
Platelets: 291 10*3/uL (ref 150–400)
RBC: 3.52 MIL/uL — ABNORMAL LOW (ref 4.22–5.81)
RDW: 13.5 % (ref 11.5–15.5)
WBC: 8.9 10*3/uL (ref 4.0–10.5)

## 2018-01-03 LAB — TROPONIN I: Troponin I: <0.03 ng/mL (ref <0.03)

## 2018-01-03 LAB — TSH: TSH: 0.366 u[IU]/mL (ref 0.350–4.500)

## 2018-01-03 LAB — URINE DRUG SCREEN, QUALITATIVE (ARMC ONLY)
Amphetamines, Ur Screen: NOT DETECTED
Barbiturates, Ur Screen: NOT DETECTED
Benzodiazepine, Ur Scrn: POSITIVE — AB
Cannabinoid 50 Ng, Ur ~~LOC~~: POSITIVE — AB
Cocaine Metabolite,Ur ~~LOC~~: NOT DETECTED
MDMA (Ecstasy)Ur Screen: NOT DETECTED
Methadone Scn, Ur: NOT DETECTED
Opiate, Ur Screen: POSITIVE — AB
Phencyclidine (PCP) Ur S: NOT DETECTED
Tricyclic, Ur Screen: NOT DETECTED

## 2018-01-03 LAB — COMPREHENSIVE METABOLIC PANEL
ALBUMIN: 3.2 g/dL — AB (ref 3.5–5.0)
ALT: 9 U/L (ref 0–44)
AST: 17 U/L (ref 15–41)
Alkaline Phosphatase: 27 U/L — ABNORMAL LOW (ref 38–126)
Anion gap: 7 (ref 5–15)
BILIRUBIN TOTAL: 0.4 mg/dL (ref 0.3–1.2)
BUN: 12 mg/dL (ref 6–20)
CO2: 31 mmol/L (ref 22–32)
CREATININE: 0.72 mg/dL (ref 0.61–1.24)
Calcium: 8.4 mg/dL — ABNORMAL LOW (ref 8.9–10.3)
Chloride: 100 mmol/L (ref 98–111)
GFR calc non Af Amer: >60 mL/min (ref >60)
GLUCOSE: 90 mg/dL (ref 70–99)
POTASSIUM: 3.1 mmol/L — AB (ref 3.5–5.1)
Sodium: 138 mmol/L (ref 135–145)
TOTAL PROTEIN: 6.3 g/dL — AB (ref 6.5–8.1)

## 2018-01-03 LAB — LIPASE, BLOOD: Lipase: 21 U/L (ref 11–51)

## 2018-01-03 LAB — AMMONIA: Ammonia: 12 umol/L (ref 9–35)

## 2018-01-03 LAB — ETHANOL: Alcohol, Ethyl (B): <10 mg/dL (ref <10)

## 2018-01-03 MED ORDER — METHYLPREDNISOLONE SODIUM SUCC 125 MG IJ SOLR
125.0000 mg | Freq: Once | INTRAMUSCULAR | Status: AC
  Administered 2018-01-03: 125 mg via INTRAVENOUS
  Filled 2018-01-03: qty 2

## 2018-01-03 MED ORDER — IPRATROPIUM-ALBUTEROL 0.5-2.5 (3) MG/3ML IN SOLN
RESPIRATORY_TRACT | Status: AC
  Filled 2018-01-03: qty 6

## 2018-01-03 MED ORDER — IPRATROPIUM-ALBUTEROL 0.5-2.5 (3) MG/3ML IN SOLN
9.0000 mL | Freq: Once | RESPIRATORY_TRACT | Status: AC
  Administered 2018-01-03: 9 mL via RESPIRATORY_TRACT
  Filled 2018-01-03: qty 3

## 2018-01-03 NOTE — ED Notes (Signed)
Pt discharged at [facility] with family.  Pain assessment and vitals obtained within an hour prior to discharge.  Unable to discharge patient from Epic for correct time d/t charting malfunction.

## 2018-01-03 NOTE — ED Notes (Signed)
Pt states he is unable to provide urine specimen at this time.

## 2018-01-03 NOTE — ED Provider Notes (Signed)
[facility] Emergency Department Provider Note  ___________________________________________   First MD Initiated Contact with Patient 01/03/18 1404     (approximate)  I have reviewed the triage vital signs and the nursing notes.   HISTORY  Chief Complaint Weakness and Altered Mental Status   HPI [name] is a 57 y.o. male with a history of CHF, COPD, multiple myeloma on chemotherapy who is presenting to the emergency department today complaining of worsening shortness of breath over the past 5 days.  The patient states that his family thinks he is also confused and has staring spells into space.  He is accompanied by his fiance who states that the patient has increasing weakness over the past several days and sometimes states things that do not make sense.  Patient denies any drinking.  However, he says that he still smokes a pack and a half of cigarettes per day.  Has been on chemotherapy for approximately 40 years at this point for his multiple myeloma.  Is on 3 L of nasal cannula oxygen chronically.  Patient states that he was treated for bronchitis several weeks ago with azithromycin as well as steroids.   Past Medical History:  Diagnosis Date  . Anxiety   . Arthritis   . Atrial septal defect   . CHF (congestive heart failure) ([place])   . COPD (chronic obstructive pulmonary disease) ([place])   . Coronary artery disease   . Dysrhythmias   . GERD (gastroesophageal reflux disease)   . Hyperlipidemia   . Multiple myeloma ([place])   . Multiple myeloma ([place])   . Myocardial infarction ([place])   . Substance abuse [facility])     Patient Active Problem List   Diagnosis Date Noted  . Fatigue due to treatment 09/07/2016  . Palliative care by specialist   . Advance care planning   . Other insomnia   . Protein-calorie malnutrition, severe 05/25/2016  . Aspiration pneumonia ([place]) 05/23/2016  . Cough in adult 05/05/2016  . Multiple myeloma in relapse ([place])  09/09/2015  . Congestive heart failure ([place]) 01/06/2015  . Cardiomyopathy ([place]) 01/06/2015  . Nonsustained ventricular tachycardia ([place]) 01/06/2015  . Malnutrition of moderate degree 12/24/2014  . Pressure ulcer 12/24/2014  . Cellulitis of second finger of left hand   . Sepsis ([place]) 12/23/2014  . 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 Arteriosclerosis of coronary artery 08/22/2011  . Cardiac conduction disorder 08/22/2011  . Myocardial infarction ([place]) 08/22/2011  . Cardiac arrhythmia 08/22/2011  . Anxiety 08/21/2011  . Acid reflux 08/21/2011  . Chronic obstructive pulmonary disease ([place]) 07/26/2011  . Nicotine addiction 07/26/2011    Past Surgical History:  Procedure Laterality Date  . CARDIAC DEFIBRILLATOR PLACEMENT      Prior to Admission medications   Medication Sig Start Date End Date Taking? Authorizing Provider  acetaminophen (TYLENOL) 325 MG tablet Take 2 tablets (650 mg total) by mouth every 6 (six) hours as needed for mild pain (or Fever >/= 101). 05/28/16   Gouru, Illene Silver, MD  albuterol (PROVENTIL HFA;VENTOLIN HFA) 108 (90 Base) MCG/ACT inhaler Inhale 2 puffs into the lungs every 6 (six) hours as needed for wheezing or shortness of breath. 04/09/17   Flora Lipps, MD  ALPRAZolam Duanne Moron) 0.5 MG tablet Take 1 tablet (0.5 mg total) by mouth 3 (three) times daily as needed for anxiety. 12/14/17   Jacquelin Hawking, NP  aspirin 325 MG EC tablet Take 325 mg by mouth daily.    [provider]  atorvastatin (LIPITOR) 20 MG tablet Take 20 mg by mouth daily. 10/11/16   [provider]  dexamethasone (DECADRON) 4 MG tablet Take 3 tablets (12 mg total) by mouth once a week. Take with breakfast 09/06/17   Cammie Sickle, MD  DULoxetine (CYMBALTA) 30 MG capsule TAKE ONE CAPSULE BY MOUTH EVERY DAY 11/21/17   Cammie Sickle, MD  feeding supplement, ENSURE ENLIVE, (ENSURE ENLIVE) LIQD Take 237 mLs by mouth 3 (three) times daily  between meals. 05/28/16   Nicholes Mango, MD  fentaNYL (DURAGESIC - DOSED MCG/HR) 100 MCG/HR Place 1 patch (100 mcg total) onto the skin every other day. Use with a 25 mcg patch for total dose of 125 mcg 12/19/17   Cammie Sickle, MD  fentaNYL (DURAGESIC - DOSED MCG/HR) 25 MCG/HR patch Place 1 patch (25 mcg total) onto the skin every other day. Use with a 100 mcg patch for total dose of 125 mcg 12/19/17   Cammie Sickle, MD  Fluticasone-Salmeterol (ADVAIR DISKUS) 250-50 MCG/DOSE AEPB Inhale 1 puff into the lungs 2 (two) times daily. 07/20/17 07/20/18  Flora Lipps, MD  furosemide (LASIX) 20 MG tablet Take 20 mg by mouth daily. 09/01/16   [provider]  gabapentin (NEURONTIN) 100 MG capsule Take 2 capsules (200 mg total) by mouth 3 (three) times daily. 11/27/17   Cammie Sickle, MD  guaiFENesin-codeine 100-10 MG/5ML syrup Take 5 mLs by mouth every 4 (four) hours as needed for cough. 12/12/17   Flora Lipps, MD  HYDROmorphone (DILAUDID) 2 MG tablet Take 1 tablet (2 mg total) by mouth every 4 (four) hours as needed for severe pain. 12/26/17   Cammie Sickle, MD  ipratropium-albuterol (DUONEB) 0.5-2.5 (3) MG/3ML SOLN Take 3 mLs by nebulization every 4 (four) hours as needed. 04/09/17   Flora Lipps, MD  ixazomib citrate (NINLARO) 4 MG capsule Take 1 capsule (4 mg total) by mouth once a week. Take for 3 weeks on, then 1 week off. Take on empty stomach 1hr before or 2hrs after food. 09/06/17   Cammie Sickle, MD  megestrol (MEGACE) 20 MG tablet TAKE ONE TABLET BY MOUTH ONCE DAILY 11/21/17   Cammie Sickle, MD  metoprolol tartrate (LOPRESSOR) 50 MG tablet Take 50 mg by mouth 2 (two) times daily. 08/21/16   [provider]  ondansetron (ZOFRAN) 8 MG tablet 1 pill every 8 hours as needed. 10/31/17   Cammie Sickle, MD  OXYGEN Inhale 3 L into the lungs daily.    [provider]  polyethylene glycol (MIRALAX / GLYCOLAX) packet Take 17 g by mouth  daily. 05/29/16   Gouru, Illene Silver, MD  tiotropium (SPIRIVA HANDIHALER) 18 MCG inhalation capsule Place 1 capsule (18 mcg total) into inhaler and inhale daily. 04/09/17 04/09/18  Flora Lipps, MD    Allergies Patient has no known allergies.  Family History  Problem Relation Age of Onset  . COPD Mother   . CAD Mother   . Heart attack Father   . Prostate cancer Maternal Grandfather   . Kidney cancer Neg Hx   . Bladder Cancer Neg Hx     Social History Social History   Tobacco Use  . Smoking status: Current Every Day Smoker    Packs/day: 0.50    Years: 40.00    Pack years: 20.00    Types: Cigarettes  . Smokeless tobacco: Never Used  Substance Use Topics  . Alcohol use: No    Alcohol/week: 0.0 standard drinks  . Drug use:  No    Review of Systems  Constitutional: No fever/chills Eyes: No visual changes. ENT: No sore throat. Cardiovascular: Denies chest pain. Respiratory: As above Gastrointestinal: No abdominal pain.  No nausea, no vomiting.  No diarrhea.  No constipation. Genitourinary: Negative for dysuria. Musculoskeletal: Negative for back pain. Skin: Negative for rash. Neurological: Negative for headaches, focal weakness or numbness.  ____________________________________________   PHYSICAL EXAM:  VITAL SIGNS: ED Triage Vitals  Enc Vitals Group     BP 01/03/18 1404 108/75     Pulse Rate 01/03/18 1404 74     Resp 01/03/18 1404 16     Temp 01/03/18 1404 (!) 97.5 F (36.4 C)     Temp Source 01/03/18 1404 Oral     SpO2 01/03/18 1404 100 %     Weight 01/03/18 1401 137 lb (62.1 kg)     Height 01/03/18 1401 6' (1.829 m)     Head Circumference --      Peak Flow --      Pain Score 01/03/18 1401 0     Pain Loc --      Pain Edu? --      Excl. in [place]? --     Constitutional: Alert and oriented. Well appearing and in no acute distress. Eyes: Conjunctivae are normal.  Head: Atraumatic. Nose: No congestion/rhinnorhea.  Wearing nasal cannula oxygen. Mouth/Throat: Mucous  membranes are moist.  Neck: No stridor.   Cardiovascular: Normal rate, regular rhythm. Grossly normal heart sounds.   Respiratory: Normal respiratory effort.  Mildly prolonged expiratory phase with decreased air movement throughout and wheezing throughout which are quiet to auscultation. Gastrointestinal: Soft and nontender. No distention.  Musculoskeletal: No lower extremity tenderness nor edema.  No joint effusions. Neurologic:  Normal speech and language. No gross focal neurologic deficits are appreciated. Skin:  Skin is warm, dry and intact. No rash noted. Psychiatric: Mood and affect are normal. Speech and behavior are normal.  ____________________________________________   LABS (all labs ordered are listed, but only abnormal results are displayed)  Labs Reviewed  CBC WITH DIFFERENTIAL/PLATELET - Abnormal; Notable for the following components:      Result Value   RBC 3.52 (*)    Hemoglobin 10.1 (*)    HCT 31.8 (*)    All other components within normal limits  COMPREHENSIVE METABOLIC PANEL - Abnormal; Notable for the following components:   Potassium 3.1 (*)    Calcium 8.4 (*)    Total Protein 6.3 (*)    Albumin 3.2 (*)    Alkaline Phosphatase 27 (*)    All other components within normal limits  AMMONIA  TROPONIN I  ETHANOL  LIPASE, BLOOD  URINALYSIS, COMPLETE (UACMP) WITH MICROSCOPIC  TSH  BLOOD GAS, ARTERIAL   ____________________________________________  EKG  ED ECG REPORT I, Doran Stabler, the attending physician, personally viewed and interpreted this ECG.   Date: 01/03/2018  EKG Time: 1405  Rate: 74  Rhythm: normal sinus rhythm  Axis: Normal  Intervals:right bundle branch block  ST&T Change: No ST segment elevation or depression.  Single T wave inversion in aVL.  EKG machine reads inferior ST elevation.  No significant change from previous.  ____________________________________________  RADIOLOGY  Chest x-ray as well as CT head without acute  pathology. ____________________________________________   PROCEDURES  Procedure(s) performed:   Procedures  Critical Care performed:   ____________________________________________   INITIAL IMPRESSION / ASSESSMENT AND PLAN / ED COURSE  Pertinent labs & imaging results that were available during my care of the  patient were reviewed by me and considered in my medical decision making (see chart for details).  Differential diagnosis includes, but is not limited to, alcohol, illicit or prescription medications, or other toxic ingestion; intracranial pathology such as stroke or intracerebral hemorrhage; fever or infectious causes including sepsis; hypoxemia and/or hypercarbia; uremia; trauma; endocrine related disorders such as diabetes, hypoglycemia, and thyroid-related diseases; hypertensive encephalopathy; etc. Differential includes, but is not limited to, viral syndrome, bronchitis including COPD exacerbation, pneumonia, reactive airway disease including asthma, CHF including exacerbation with or without pulmonary/interstitial edema, pneumothorax, ACS, thoracic trauma, and pulmonary embolism. As part of my medical decision making, I reviewed the following data within the [place] Notes from prior outpatient visits  ----------------------------------------- 3:29 PM on 01/03/2018 -----------------------------------------  Patient at this time with reassuring lab work as well as imaging.  Still pending his urinalysis as well as ABG and TSH.  UDS also ordered.  Patient will require reassessment after he receives breathing treatments and steroids.  Signed out to Dr. Mariea Clonts. ____________________________________________   FINAL CLINICAL IMPRESSION(S) / ED DIAGNOSES  COPD exacerbation.  Altered mental status.  NEW MEDICATIONS STARTED DURING THIS VISIT:  New Prescriptions   No medications on file     Note:  This document was prepared using Dragon voice recognition  software and may include unintentional dictation errors.     Orbie Pyo, MD 01/03/18 [phone]

## 2018-01-03 NOTE — ED Triage Notes (Signed)
Patient from home via [facility]. Patient report taking home chemo for multiple myeloma. States he has felt weak since he started taking chemo, but for the last week, he has become increasingly fatigued and has been acting differently according to his family. Patient also reports being treated for bronchitis. Patient alert and oriented x4, but slow to answer questions.

## 2018-01-03 NOTE — Discharge Instructions (Addendum)
Please wear your oxygen at all times.  Please try to quit smoking or decrease your smoking.  Return to the emergency department if you develop changes in mental status, severe pain, shortness of breath, oxygen levels less than 90%, or any other symptoms concerning to you.

## 2018-01-03 NOTE — ED Provider Notes (Signed)
Patient with signed out to me by Dr. Larae Grooms.  57 year old gentleman with a history of multiple myeloma on chemo presenting for brief episodes of confusion.  Overall, the patient has remained hemodynamically stable and able to maintain oxygen saturations in the mid 90s on his baseline 3 L nasal cannula.  In the emergency department, his work-up has been reassuring.  At signout, his urinalysis was pending which does not show infection.  His UDS does show cannabinoid use, benzodiazepines and opiates.  This may be why he intermittently continues to fall asleep; he and his wife are concerned that he was sometimes fall asleep during eating.  I have counseled him to decrease his tobacco use, and the use of his medications which might make him sleepy.  He will be discharged home with follow-up instructions to see his oncologist in the next 1 to 2 days.  Patient's VBG showed a normal pH with carbon dioxide of 50; given his underlying disease, I am not concerned about this mild hypercarbia.  Return precautions were also discussed.   Eula Listen, MD 01/03/18 [phone]

## 2018-01-08 ENCOUNTER — Other Ambulatory Visit: Payer: Self-pay | Admitting: *Deleted

## 2018-01-08 DIAGNOSIS — C9000 Multiple myeloma not having achieved remission: Secondary | ICD-10-CM

## 2018-01-08 MED ORDER — HYDROMORPHONE HCL 2 MG PO TABS: 2.0000 mg | ORAL_TABLET | ORAL | 0 refills | Status: DC | PRN

## 2018-01-08 NOTE — Telephone Encounter (Signed)
Patient called cancer center requesting refill of Dilaudid 2 mg q 4 hours for pain.   As mandated by the [place] STOP Act (Strengthen Opioid Misuse Prevention), the [place] Controlled Substance Reporting System ([place]) was reviewed for this patient.  Below is the past 63-months of controlled substance prescriptions as displayed by the registry.  I have personally consulted with my supervising physician, Dr. Rogue Bussing, who agrees that continuation of opiate therapy is medically appropriate at this time and agrees to provide continual monitoring, including urine/blood drug screens, as indicated. Refill is appropriate on or after 01/10/18.    NCCS[place] reviewed:     Faythe Casa, NP 01/08/2018 2:30 PM [phone]

## 2018-01-18 ENCOUNTER — Other Ambulatory Visit: Payer: Self-pay | Admitting: *Deleted

## 2018-01-18 ENCOUNTER — Other Ambulatory Visit: Payer: Self-pay | Admitting: Internal Medicine

## 2018-01-18 DIAGNOSIS — C9002 Multiple myeloma in relapse: Secondary | ICD-10-CM

## 2018-01-18 MED ORDER — FENTANYL 100 MCG/HR TD PT72: 100.0000 ug | MEDICATED_PATCH | TRANSDERMAL | 0 refills | Status: DC

## 2018-01-18 MED ORDER — FENTANYL 25 MCG/HR TD PT72: 25.0000 ug | MEDICATED_PATCH | TRANSDERMAL | 0 refills | Status: DC

## 2018-01-18 NOTE — Telephone Encounter (Signed)
Patient called [facility] requesting refill of fentanyl.   As mandated by the [place] STOP Act (Strengthen Opioid Misuse Prevention), the [place] Controlled Substance Reporting System ([place]) was reviewed for this patient.  Below is the recent report of controlled substance prescriptions as displayed by the registry.  I have personally consulted with my supervising physician, Dr. Rogue Bussing, who agrees that continuation of opiate therapy is medically appropriate at this time and agrees to provide continual monitoring, including urine/blood drug screens, as indicated. Prescription sent electronically using [employer] secure transmission to requested pharmacy.   [place] Reviewed:     Beckey Rutter, DNP, AGNP-C [place] at [facility] [phone] (work cell) [phone] (office) 01/18/18 4:40 PM

## 2018-01-18 NOTE — Telephone Encounter (Signed)
Last fill on fentanyl 25 mcg patches was 12/22/17. Last fill on fentanyl 180mcg patches was on 12/27/17. First fill dates updated to reflect this.

## 2018-01-21 ENCOUNTER — Other Ambulatory Visit: Payer: Self-pay | Admitting: *Deleted

## 2018-01-21 DIAGNOSIS — C9000 Multiple myeloma not having achieved remission: Secondary | ICD-10-CM

## 2018-01-21 MED ORDER — HYDROMORPHONE HCL 2 MG PO TABS: 2.0000 mg | ORAL_TABLET | ORAL | 0 refills | Status: DC | PRN

## 2018-01-21 NOTE — Telephone Encounter (Signed)
Narcotic refill request: Hydromorphone  As mandated by the [place] STOP Act ([place]reng[place] Opioid Misuse Prevention), the [employer] reviewed prior to consideration of refills as below:       Given a current oncology diagnosis, this patient has the potential to experience significant cancer related pain. Benefits versus risks associated with continued therapy considered. Will continue pain management with opioids as previously prescribed.   Patient educated that medications should not be bitten, chewed, or crushed. Additionally, safety precautions reviewed. Patient verbalized understanding that medications should not be sold or shared, taken with alcohol, or used while driving. He has been made aware of the side effects of using this medication. Patient understands that this medication can cause CNS depression, increase his risk of falls, and even lead to overdose that may result in death, if used outside of the parameters that he and I discussed.   With all of this in mind, he accepts the risks and responsibilities associated with therapy and elects to continue to use the prescribed interventions. As supervising physician, Dr. Rogue Bussing, agrees that continuation of opioid therapy is medically appropriate at this time and agrees to provide continual monitoring, including urine/blood drug screens, as indicated.   Refill prescription sent electronically using [employer] secure transmission to requested pharmacy:  1. Dilaudid  Beckey Rutter, DNP, AGNP-C [place] at [facility] [phone] (work cell) [phone] (office)

## 2018-01-22 ENCOUNTER — Telehealth: Payer: Self-pay | Admitting: Pharmacist

## 2018-01-22 NOTE — Telephone Encounter (Signed)
Oral Chemotherapy Pharmacist Encounter  Follow-Up Form  Following up regarding patient's oral chemotherapy medication: Ninlaro (ixazomib)  Original Start date of oral chemotherapy: 08/2017  [place] called patient for follow-up.  Pt reported 0 tablets/doses of Ninlaro missed.   Pt reported the following side effects: None reported  Recent labs reviewed: CBC/CMP from 01/03/18  DDI recheck: no Ninlaro DDIs  Other Issues: None reported  Oral Oncology Clinic will continue to follow.  Darl Pikes, PharmD, BCPS, [facility] Hematology/Oncology Clinical Pharmacist [facility]/AP [facility] [phone]  01/22/2018 2:09 PM

## 2018-01-24 MED FILL — NINLARO 4 MG CAP: 4 | 28 days supply | Qty: 3 | Fill #0

## 2018-01-24 MED FILL — DEXAMETHASONE 4 MG TABLET: 4 | 28 days supply | Qty: 12 | Fill #0

## 2018-01-30 ENCOUNTER — Inpatient Hospital Stay (HOSPITAL_BASED_OUTPATIENT_CLINIC_OR_DEPARTMENT_OTHER): Payer: Medicaid Other | Admitting: Internal Medicine

## 2018-01-30 ENCOUNTER — Other Ambulatory Visit: Payer: Self-pay

## 2018-01-30 ENCOUNTER — Inpatient Hospital Stay: Payer: Medicaid Other | Attending: Internal Medicine

## 2018-01-30 VITALS — BP 95/64 | HR 94 | Temp 98.7°F | Resp 22 | Ht 72.0 in | Wt 131.0 lb

## 2018-01-30 DIAGNOSIS — J449 Chronic obstructive pulmonary disease, unspecified: Secondary | ICD-10-CM

## 2018-01-30 DIAGNOSIS — Z7982 Long term (current) use of aspirin: Secondary | ICD-10-CM | POA: Diagnosis not present

## 2018-01-30 DIAGNOSIS — Z95810 Presence of automatic (implantable) cardiac defibrillator: Secondary | ICD-10-CM | POA: Diagnosis not present

## 2018-01-30 DIAGNOSIS — C9002 Multiple myeloma in relapse: Secondary | ICD-10-CM | POA: Insufficient documentation

## 2018-01-30 DIAGNOSIS — F419 Anxiety disorder, unspecified: Secondary | ICD-10-CM

## 2018-01-30 DIAGNOSIS — R110 Nausea: Secondary | ICD-10-CM | POA: Diagnosis not present

## 2018-01-30 DIAGNOSIS — G8929 Other chronic pain: Secondary | ICD-10-CM | POA: Insufficient documentation

## 2018-01-30 DIAGNOSIS — F17210 Nicotine dependence, cigarettes, uncomplicated: Secondary | ICD-10-CM | POA: Insufficient documentation

## 2018-01-30 DIAGNOSIS — Z79899 Other long term (current) drug therapy: Secondary | ICD-10-CM | POA: Diagnosis not present

## 2018-01-30 LAB — CBC WITH DIFFERENTIAL/PLATELET
ABS IMMATURE GRANULOCYTES: 0.03 10*3/uL (ref 0.00–0.07)
BASOS ABS: 0 10*3/uL (ref 0.0–0.1)
Basophils Relative: 0 %
Eosinophils Absolute: 0.5 10*3/uL (ref 0.0–0.5)
Eosinophils Relative: 5 %
HCT: 36.9 % — ABNORMAL LOW (ref 39.0–52.0)
Hemoglobin: 11.4 g/dL — ABNORMAL LOW (ref 13.0–17.0)
Immature Granulocytes: 0 %
Lymphocytes Relative: 15 %
Lymphs Abs: 1.4 10*3/uL (ref 0.7–4.0)
MCH: 27.8 pg (ref 26.0–34.0)
MCHC: 30.9 g/dL (ref 30.0–36.0)
MCV: 90 fL (ref 80.0–100.0)
Monocytes Absolute: 0.7 10*3/uL (ref 0.1–1.0)
Monocytes Relative: 8 %
NEUTROS ABS: 6.5 10*3/uL (ref 1.7–7.7)
Neutrophils Relative %: 72 %
Platelets: 243 10*3/uL (ref 150–400)
RBC: 4.1 MIL/uL — ABNORMAL LOW (ref 4.22–5.81)
RDW: 13.2 % (ref 11.5–15.5)
WBC: 9.1 10*3/uL (ref 4.0–10.5)
nRBC: 0 % (ref 0.0–0.2)

## 2018-01-30 LAB — COMPREHENSIVE METABOLIC PANEL
ALT: 7 U/L (ref 0–44)
AST: 13 U/L — ABNORMAL LOW (ref 15–41)
Albumin: 3.6 g/dL (ref 3.5–5.0)
Alkaline Phosphatase: 34 U/L — ABNORMAL LOW (ref 38–126)
Anion gap: 5 (ref 5–15)
BUN: 7 mg/dL (ref 6–20)
CO2: 33 mmol/L — ABNORMAL HIGH (ref 22–32)
Calcium: 8.7 mg/dL — ABNORMAL LOW (ref 8.9–10.3)
Chloride: 103 mmol/L (ref 98–111)
Creatinine, Ser: 0.78 mg/dL (ref 0.61–1.24)
GFR calc Af Amer: >60 mL/min (ref >60)
GFR calc non Af Amer: >60 mL/min (ref >60)
Glucose, Bld: 113 mg/dL — ABNORMAL HIGH (ref 70–99)
Potassium: 3.5 mmol/L (ref 3.5–5.1)
Sodium: 141 mmol/L (ref 135–145)
Total Bilirubin: 0.4 mg/dL (ref 0.3–1.2)
Total Protein: 6.6 g/dL (ref 6.5–8.1)

## 2018-01-30 NOTE — Progress Notes (Signed)
[facility] OFFICE PROGRESS NOTE  Patient Care Team: Petra Kuba, MD as PCP - General (Family Medicine)  Cancer Staging No matching staging information was found for the patient.   Oncology History   # SEP 2015- MULTIPLE MYELOMA [multiple PET pos Bone lesions; hypercalcemia s/p BMBx; FISH- Aneuploidy - gain of chromosome 7,9,15,FGFR3/4p16.3, and CCND1/11q13. Loss of MAF/16q23.1), cytogenetics normal 46XY] s/p Vel-Dex-Rev-Zometa; Excellent PR;   # MARCH 2016- REV-DEX ; FEB 2017-[discn Dex] Rev 25 mg 3 w On & 1 w Off; March 2017- M- protein 0.4gm/dl; K/L= 7.9; cont Rev;   # Revlimid HELD June 2018 [sec to [facility]]  # AUG 2019- RECURRENCE- NINLARO-DEX  # March 2017-  Left Middle Lobe cavitary lesion- ~7m- repeat Ct in 3-480m# AUG 2017- [facility] [[name], [place] #  ? [facility]- [id number] not follow up ]- DISCONT Zometa.; NO HBO sec to   # Chronic pain/Anxiety [ortho]  # COPD/smoking/ [[facility]- not candidate for BMT- sec to co-morbidities/poor nutritional status];   --------------------------------------------------------------------   DIAGNOSIS: MULTIPLE MYELOMA  STAGE: RECURRENT  ;GOALS: CONTROL  CURRENT/MOST RECENT THERAPY; AUG 2019- NINLARO-DEX      Multiple myeloma in relapse ([facility]     INTERVAL HISTORY:  [name] [age].o.  male pleasant patient above history of recurrent/relapse multiple myeloma-currently on Ninlaro is here for follow-up.  Patient denies any diarrhea mild nausea no vomiting.  Continues to chronic back pain-feels fairly stable on fentanyl patches and Dilaudid.  Chronic shortness of breath chronic cough.  Not any worse.  Intermittent wheezing.  Unfortunately continues to smoke.  Review of Systems  Constitutional: Positive for malaise/fatigue. Negative for chills, diaphoresis and fever.  HENT: Negative for nosebleeds and sore throat.   Eyes: Negative for double vision.  Respiratory: Positive for cough, shortness of breath and  wheezing. Negative for hemoptysis and sputum production.   Cardiovascular: Negative for chest pain, palpitations, orthopnea and leg swelling.  Gastrointestinal: Positive for nausea. Negative for abdominal pain, blood in stool, constipation, diarrhea, heartburn, melena and vomiting.  Genitourinary: Negative for dysuria, frequency and urgency.  Musculoskeletal: Positive for back pain (worse) and joint pain.  Skin: Negative.  Negative for itching and rash.  Neurological: Negative for dizziness, tingling, focal weakness, weakness and headaches.  Endo/Heme/Allergies: Does not bruise/bleed easily.  Psychiatric/Behavioral: Negative for depression. The patient is not nervous/anxious and does not have insomnia.       PAST MEDICAL HISTORY :  Past Medical History:  Diagnosis Date  . Anxiety   . Arthritis   . Atrial septal defect   . CHF (congestive heart failure) (HCDe [place]  . COPD (chronic obstructive pulmonary disease) (HCBern  . Coronary artery disease   . Dysrhythmias   . GERD (gastroesophageal reflux disease)   . Hyperlipidemia   . Multiple myeloma (HCTem[facility]  . Multiple myeloma (HC[facility]  . Myocardial infarction (HCStites  . Substance abuse (HCC[facility]    PAST SURGICAL HISTORY :   Past Surgical History:  Procedure Laterality Date  . CARDIAC DEFIBRILLATOR PLACEMENT      FAMILY HISTORY :   Family History  Problem Relation Age of Onset  . COPD Mother   . CAD Mother   . Heart attack Father   . Prostate cancer Maternal Grandfather   . Kidney cancer Neg Hx   . Bladder Cancer Neg Hx     SOCIAL HISTORY:   Social History   Tobacco Use  . Smoking status: Current Every Day Smoker  Packs/day: 0.50    Years: 40.00    Pack years: 20.00    Types: Cigarettes  . Smokeless tobacco: Never Used  Substance Use Topics  . Alcohol use: No    Alcohol/week: 0.0 standard drinks  . Drug use: No    ALLERGIES:  has No Known Allergies.  MEDICATIONS:  Current Outpatient Medications  Medication Sig  Dispense Refill  . acetaminophen (TYLENOL) 325 MG tablet Take 2 tablets (650 mg total) by mouth every 6 (six) hours as needed for mild pain (or Fever >/= 101).    Marland Kitchen albuterol (PROVENTIL HFA;VENTOLIN HFA) 108 (90 Base) MCG/ACT inhaler Inhale 2 puffs into the lungs every 6 (six) hours as needed for wheezing or shortness of breath. 1 Inhaler 6  . ALPRAZolam (XANAX) 0.5 MG tablet Take 1 tablet (0.5 mg total) by mouth 3 (three) times daily as needed for anxiety. 90 tablet 1  . aspirin 325 MG EC tablet Take 325 mg by mouth daily.    Marland Kitchen atorvastatin (LIPITOR) 20 MG tablet Take 20 mg by mouth daily.  2  . dexamethasone (DECADRON) 4 MG tablet TAKE 3 TABLETS (12 MG TOTAL) BY MOUTH ONCE A WEEK. TAKE WITH BREAKFAST 12 tablet 4  . DULoxetine (CYMBALTA) 30 MG capsule TAKE ONE CAPSULE BY MOUTH EVERY DAY 90 capsule 1  . fentaNYL (DURAGESIC - DOSED MCG/HR) 100 MCG/HR Place 1 patch (100 mcg total) onto the skin every other day. Use with a 25 mcg patch for total dose of 125 mcg 15 patch 0  . fentaNYL (DURAGESIC - DOSED MCG/HR) 25 MCG/HR patch Place 1 patch (25 mcg total) onto the skin every other day. Use with a 100 mcg patch for total dose of 125 mcg 15 patch 0  . Fluticasone-Salmeterol (ADVAIR DISKUS) 250-50 MCG/DOSE AEPB Inhale 1 puff into the lungs 2 (two) times daily. 3 each 2  . furosemide (LASIX) 20 MG tablet Take 20 mg by mouth daily.  0  . gabapentin (NEURONTIN) 100 MG capsule Take 2 capsules (200 mg total) by mouth 3 (three) times daily. 180 capsule 3  . guaiFENesin-codeine 100-10 MG/5ML syrup Take 5 mLs by mouth every 4 (four) hours as needed for cough. 120 mL 0  . ipratropium-albuterol (DUONEB) 0.5-2.5 (3) MG/3ML SOLN Take 3 mLs by nebulization every 4 (four) hours as needed. 360 mL 3  . megestrol (MEGACE) 20 MG tablet TAKE ONE TABLET BY MOUTH ONCE DAILY 30 tablet 2  . metoprolol tartrate (LOPRESSOR) 50 MG tablet Take 50 mg by mouth 2 (two) times daily.  2  . NINLARO 4 MG capsule TAKE 1 CAPSULE (4 MG TOTAL)  BY MOUTH ONCE A WEEK. TAKE FOR 3 WEEKS ON, THEN 1 WEEK OFF. TAKE ON EMPTY STOMACH 1HR BEFORE OR 2HRS AFTER FOOD. 3 capsule 4  . ondansetron (ZOFRAN) 8 MG tablet 1 pill every 8 hours as needed. 40 tablet 0  . OXYGEN Inhale 3 L into the lungs daily.    . polyethylene glycol (MIRALAX / GLYCOLAX) packet Take 17 g by mouth daily. 30 each 0  . tiotropium (SPIRIVA HANDIHALER) 18 MCG inhalation capsule Place 1 capsule (18 mcg total) into inhaler and inhale daily. 30 capsule 6  . feeding supplement, ENSURE ENLIVE, (ENSURE ENLIVE) LIQD Take 237 mLs by mouth 3 (three) times daily between meals. (Patient not taking: Reported on 01/30/2018) 90 Bottle 1  . HYDROmorphone (DILAUDID) 2 MG tablet Take 1 tablet (2 mg total) by mouth every 4 (four) hours as needed for severe pain. 84 tablet  0   No current facility-administered medications for this visit.    Facility-Administered Medications Ordered in Other Visits  Medication Dose Route Frequency Provider Last Rate Last Dose  . 0.9 %  sodium chloride infusion   Intravenous Continuous Cammie Sickle, MD 10 mL/hr at 02/24/15 1520      PHYSICAL EXAMINATION: ECOG PERFORMANCE STATUS: 2 - Symptomatic, <50% confined to bed  BP 95/64   Pulse 94   Temp 98.7 F (37.1 C) (Oral)   Resp (!) 22   Ht 6' (1.829 m)   Wt 131 lb (59.4 kg)   BMI 17.77 kg/m   Filed Weights   01/30/18 1452  Weight: 131 lb (59.4 kg)    Physical Exam  Constitutional: He is oriented to person, place, and time.  Cachectic appearing Caucasian male patient; 2 L nasal cannula.  Accompanied by family.  He is walking by himself.  HENT:  Head: Normocephalic and atraumatic.  Mouth/Throat: Oropharynx is clear and moist. No oropharyngeal exudate.  Eyes: Pupils are equal, round, and reactive to light.  Neck: Normal range of motion. Neck supple.  Cardiovascular: Normal rate and regular rhythm.  Pulmonary/Chest: No respiratory distress. He has wheezes.  Bilateral decreased air entry.   Abdominal: Soft. Bowel sounds are normal. He exhibits no distension and no mass. There is no abdominal tenderness. There is no rebound and no guarding.  Musculoskeletal: Normal range of motion.        General: No tenderness or edema.  Neurological: He is alert and oriented to person, place, and time.  Skin: Skin is warm.  Psychiatric: Affect normal.       LABORATORY DATA:  I have reviewed the data as listed    Component Value Date/Time   NA 141 01/30/2018 1411   NA 135 05/12/2014 1343   K 3.5 01/30/2018 1411   K 3.3 (L) 05/12/2014 1343   CL 103 01/30/2018 1411   CL 101 05/12/2014 1343   CO2 33 (H) 01/30/2018 1411   CO2 27 05/12/2014 1343   GLUCOSE 113 (H) 01/30/2018 1411   GLUCOSE 77 05/12/2014 1343   BUN 7 01/30/2018 1411   BUN 9 05/12/2014 1343   CREATININE 0.78 01/30/2018 1411   CREATININE 0.89 05/12/2014 1343   CALCIUM 8.7 (L) 01/30/2018 1411   CALCIUM 8.8 (L) 05/12/2014 1343   PROT 6.6 01/30/2018 1411   PROT 5.9 (L) 02/17/2014 1405   ALBUMIN 3.6 01/30/2018 1411   ALBUMIN 2.8 (L) 02/17/2014 1405   AST 13 (L) 01/30/2018 1411   AST 9 (L) 02/17/2014 1405   ALT 7 01/30/2018 1411   ALT 12 (L) 02/17/2014 1405   ALKPHOS 34 (L) 01/30/2018 1411   ALKPHOS 44 (L) 02/17/2014 1405   BILITOT 0.4 01/30/2018 1411   BILITOT 0.2 02/17/2014 1405   GFRNONAA >60 01/30/2018 1411   GFRNONAA >60 05/12/2014 1343   GFRAA >60 01/30/2018 1411   GFRAA >60 05/12/2014 1343    No results found for: SPEP, UPEP  Lab Results  Component Value Date   WBC 9.1 01/30/2018   NEUTROABS 6.5 01/30/2018   HGB 11.4 (L) 01/30/2018   HCT 36.9 (L) 01/30/2018   MCV 90.0 01/30/2018   PLT 243 01/30/2018      Chemistry      Component Value Date/Time   NA 141 01/30/2018 1411   NA 135 05/12/2014 1343   K 3.5 01/30/2018 1411   K 3.3 (L) 05/12/2014 1343   CL 103 01/30/2018 1411   CL 101 05/12/2014 1343  CO2 33 (H) 01/30/2018 1411   CO2 27 05/12/2014 1343   BUN 7 01/30/2018 1411   BUN 9  05/12/2014 1343   CREATININE 0.78 01/30/2018 1411   CREATININE 0.89 05/12/2014 1343      Component Value Date/Time   CALCIUM 8.7 (L) 01/30/2018 1411   CALCIUM 8.8 (L) 05/12/2014 1343   ALKPHOS 34 (L) 01/30/2018 1411   ALKPHOS 44 (L) 02/17/2014 1405   AST 13 (L) 01/30/2018 1411   AST 9 (L) 02/17/2014 1405   ALT 7 01/30/2018 1411   ALT 12 (L) 02/17/2014 1405   BILITOT 0.4 01/30/2018 1411   BILITOT 0.2 02/17/2014 1405       RADIOGRAPHIC STUDIES: I have personally reviewed the radiological images as listed and agreed with the findings in the report. No results found.   ASSESSMENT & PLAN:  Multiple myeloma in relapse ([place]) # MULTIPLE MYELOMA-AUG 2019-IgG kappa;M protein=0.6; kappa light chains 570/elevated. AUG 2019-  Bone survey shows progressive lesions in the bilateral humeri/femur. Oct 2019- PET- no lytic lesions noted.    # Currently on Ninlaro-Dex [start again 12/11]- PR noted; DEC 2019 -Kappa light chains- 11; M protein= 0.2 gm/dl.  Stable.  #  Osteonecrosis of the jaw- STABLE. Denosumab discontinued.  #October 2019 PET scan Bil scattered lung nodules- up to 5 mm-  Repeat CT in march 2020-STABLE; will order CT scan at next visit.   # chronic pain/anxiety-STABLE; - on fentanyl [159mg] q48 hours; and Dilaudid 2 mg every 4 hours. STABLE.   # COPD- wheezing- STABLE.   # Chronic nausea-question secondary to Ninlaro.  Continue Zofran as needed. STABLE.   DISPOSITION;  # follow up in 1 month- MD- cbc/cmp/MM panel; K-l light chains- Dr.B    No orders of the defined types were placed in this encounter.  All questions were answered. The patient knows to call the clinic with any problems, questions or concerns.      GCammie Sickle MD 02/05/2018 10:59 AM

## 2018-01-30 NOTE — Assessment & Plan Note (Addendum)
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 AUG 2019-  Bone survey shows progressive lesions in the bilateral humeri/femur. Oct 2019- PET- no lytic lesions noted.    # Currently on Ninlaro-Dex [start again 12/11]- PR noted; DEC 2019 -Kappa light chains- 11; M protein= 0.2 gm/dl.  Stable.  #  Osteonecrosis of the jaw- STABLE. Denosumab discontinued.  #October 2019 PET scan Bil scattered lung nodules- up to 5 mm-  Repeat CT in march 2020-STABLE; will order CT scan at next visit.   # chronic pain/anxiety-STABLE; - on fentanyl [164mg] q48 hours; and Dilaudid 2 mg every 4 hours. STABLE.   # COPD- wheezing- STABLE.   # Chronic nausea-question secondary to Ninlaro.  Continue Zofran as needed. STABLE.   DISPOSITION;  # follow up in 1 month- MD- cbc/cmp/MM panel; K-l light chains- Dr.B

## 2018-01-31 LAB — KAPPA/LAMBDA LIGHT CHAINS
Kappa free light chain: 13.2 mg/L (ref 3.3–19.4)
Kappa, lambda light chain ratio: 2 — ABNORMAL HIGH (ref 0.26–1.65)
Lambda free light chains: 6.6 mg/L (ref 5.7–26.3)

## 2018-02-01 LAB — MULTIPLE MYELOMA PANEL, SERUM
Albumin SerPl Elph-Mcnc: 3.3 g/dL (ref 2.9–4.4)
Albumin/Glob SerPl: 1.3 (ref 0.7–1.7)
Alpha 1: 0.3 g/dL (ref 0.0–0.4)
Alpha2 Glob SerPl Elph-Mcnc: 0.9 g/dL (ref 0.4–1.0)
B-Globulin SerPl Elph-Mcnc: 0.9 g/dL (ref 0.7–1.3)
Gamma Glob SerPl Elph-Mcnc: 0.6 g/dL (ref 0.4–1.8)
Globulin, Total: 2.7 g/dL (ref 2.2–3.9)
IgA: 74 mg/dL — ABNORMAL LOW (ref 90–386)
IgG (Immunoglobin G), Serum: 909 mg/dL (ref 700–1600)
IgM (Immunoglobulin M), Srm: 34 mg/dL (ref 20–172)
M Protein SerPl Elph-Mcnc: 0.2 g/dL — ABNORMAL HIGH
Total Protein ELP: 6 g/dL (ref 6.0–8.5)

## 2018-02-04 ENCOUNTER — Other Ambulatory Visit: Payer: Self-pay | Admitting: *Deleted

## 2018-02-04 DIAGNOSIS — C9000 Multiple myeloma not having achieved remission: Secondary | ICD-10-CM

## 2018-02-04 MED ORDER — HYDROMORPHONE HCL 2 MG PO TABS: 2.0000 mg | ORAL_TABLET | ORAL | 0 refills | Status: DC | PRN

## 2018-02-04 NOTE — Telephone Encounter (Signed)
Narcotic refill request: Hydromorphone  As mandated by the [place] STOP Act (Strengthen Opioid Misuse Prevention), the [place] reviewed prior to consideration of refills as below:    Given a current oncology diagnosis, this patient has the potential to experience significant cancer related pain. Benefits versus risks associated with continued therapy considered. Will continue pain management with opioids as previously prescribed.   Patient educated that medications should not be bitten, chewed, or crushed. Additionally, safety precautions reviewed. Patient verbalized understanding that medications should not be sold or shared, taken with alcohol, or used while driving. He has been made aware of the side effects of using this medication. Patient understands that this medication can cause CNS depression, increase his risk of falls, and even lead to overdose that may result in death, if used outside of the parameters that he and I discussed.   With all of this in mind, he accepts the risks and responsibilities associated with therapy and elects to continue to use the prescribed interventions. As supervising physician, Dr. Rogue Bussing, agrees that continuation of opioid therapy is medically appropriate at this time and agrees to provide continual monitoring, including urine/blood drug screens, as indicated.   Refill prescription sent electronically using [employer] secure transmission to requested pharmacy:  1. Hydromorphone 2 mg tablet  Beckey Rutter, DNP, AGNP-C [facility] (work cell) [phone] (office)

## 2018-02-11 ENCOUNTER — Other Ambulatory Visit: Payer: Self-pay | Admitting: *Deleted

## 2018-02-11 ENCOUNTER — Other Ambulatory Visit: Payer: Self-pay | Admitting: Oncology

## 2018-02-11 DIAGNOSIS — C9000 Multiple myeloma not having achieved remission: Secondary | ICD-10-CM

## 2018-02-11 DIAGNOSIS — C9002 Multiple myeloma in relapse: Secondary | ICD-10-CM

## 2018-02-11 DIAGNOSIS — F419 Anxiety disorder, unspecified: Secondary | ICD-10-CM

## 2018-02-11 MED ORDER — ALPRAZOLAM 0.5 MG PO TABS: 0.5000 mg | ORAL_TABLET | Freq: Three times a day (TID) | ORAL | 1 refills | Status: DC | PRN

## 2018-02-11 NOTE — Telephone Encounter (Signed)
Patient called cancer center requesting refill of xanax 0.5 mg q 8 hours PRN for anxiety.    As mandated by the [place] STOP Act (Strengthen Opioid Misuse Prevention), the [place] Controlled Substance Reporting System ([place]) was reviewed for this patient.  Below is the past 25-months of controlled substance prescriptions as displayed by the registry.  I have personally consulted with my supervising physician, Dr. Rogue Bussing, who agrees that continuation of opiate therapy is medically appropriate at this time and agrees to provide continual monitoring, including urine/blood drug screens, as indicated. Refill is appropriate on or after 02/11/18.   NCCSRS reviewed:     Faythe Casa, NP 02/11/2018 12:56 PM [phone]

## 2018-02-15 ENCOUNTER — Inpatient Hospital Stay
Admission: EM | Admit: 2018-02-15 | Discharge: 2018-02-18 | DRG: 391 | Disposition: A | Discharge status: Home / Self Care | Payer: Medicaid Other | Attending: Internal Medicine | Admitting: Internal Medicine

## 2018-02-15 ENCOUNTER — Emergency Department: Payer: Medicaid Other

## 2018-02-15 ENCOUNTER — Other Ambulatory Visit: Payer: Self-pay

## 2018-02-15 DIAGNOSIS — E785 Hyperlipidemia, unspecified: Secondary | ICD-10-CM | POA: Diagnosis present

## 2018-02-15 DIAGNOSIS — M1990 Unspecified osteoarthritis, unspecified site: Secondary | ICD-10-CM | POA: Diagnosis present

## 2018-02-15 DIAGNOSIS — Q211 Atrial septal defect: Secondary | ICD-10-CM | POA: Diagnosis not present

## 2018-02-15 DIAGNOSIS — Z8249 Family history of ischemic heart disease and other diseases of the circulatory system: Secondary | ICD-10-CM

## 2018-02-15 DIAGNOSIS — Z95810 Presence of automatic (implantable) cardiac defibrillator: Secondary | ICD-10-CM | POA: Diagnosis not present

## 2018-02-15 DIAGNOSIS — I2510 Atherosclerotic heart disease of native coronary artery without angina pectoris: Secondary | ICD-10-CM | POA: Diagnosis present

## 2018-02-15 DIAGNOSIS — I5022 Chronic systolic (congestive) heart failure: Secondary | ICD-10-CM | POA: Diagnosis present

## 2018-02-15 DIAGNOSIS — Z681 Body mass index (BMI) 19 or less, adult: Secondary | ICD-10-CM | POA: Diagnosis not present

## 2018-02-15 DIAGNOSIS — K219 Gastro-esophageal reflux disease without esophagitis: Secondary | ICD-10-CM | POA: Diagnosis present

## 2018-02-15 DIAGNOSIS — J9610 Chronic respiratory failure, unspecified whether with hypoxia or hypercapnia: Secondary | ICD-10-CM | POA: Diagnosis present

## 2018-02-15 DIAGNOSIS — R339 Retention of urine, unspecified: Secondary | ICD-10-CM | POA: Diagnosis present

## 2018-02-15 DIAGNOSIS — A084 Viral intestinal infection, unspecified: Secondary | ICD-10-CM | POA: Diagnosis not present

## 2018-02-15 DIAGNOSIS — Z79899 Other long term (current) drug therapy: Secondary | ICD-10-CM

## 2018-02-15 DIAGNOSIS — I429 Cardiomyopathy, unspecified: Secondary | ICD-10-CM | POA: Diagnosis present

## 2018-02-15 DIAGNOSIS — J449 Chronic obstructive pulmonary disease, unspecified: Secondary | ICD-10-CM | POA: Diagnosis present

## 2018-02-15 DIAGNOSIS — Z825 Family history of asthma and other chronic lower respiratory diseases: Secondary | ICD-10-CM

## 2018-02-15 DIAGNOSIS — Z79818 Long term (current) use of other agents affecting estrogen receptors and estrogen levels: Secondary | ICD-10-CM

## 2018-02-15 DIAGNOSIS — E43 Unspecified severe protein-calorie malnutrition: Secondary | ICD-10-CM | POA: Diagnosis present

## 2018-02-15 DIAGNOSIS — E876 Hypokalemia: Secondary | ICD-10-CM | POA: Diagnosis present

## 2018-02-15 DIAGNOSIS — Z7952 Long term (current) use of systemic steroids: Secondary | ICD-10-CM

## 2018-02-15 DIAGNOSIS — C9000 Multiple myeloma not having achieved remission: Secondary | ICD-10-CM | POA: Diagnosis present

## 2018-02-15 DIAGNOSIS — F419 Anxiety disorder, unspecified: Secondary | ICD-10-CM | POA: Diagnosis present

## 2018-02-15 DIAGNOSIS — F17210 Nicotine dependence, cigarettes, uncomplicated: Secondary | ICD-10-CM | POA: Diagnosis present

## 2018-02-15 DIAGNOSIS — I472 Ventricular tachycardia: Secondary | ICD-10-CM | POA: Diagnosis present

## 2018-02-15 DIAGNOSIS — R531 Weakness: Secondary | ICD-10-CM

## 2018-02-15 DIAGNOSIS — I252 Old myocardial infarction: Secondary | ICD-10-CM

## 2018-02-15 DIAGNOSIS — Z7982 Long term (current) use of aspirin: Secondary | ICD-10-CM

## 2018-02-15 DIAGNOSIS — R197 Diarrhea, unspecified: Secondary | ICD-10-CM | POA: Diagnosis present

## 2018-02-15 DIAGNOSIS — K529 Noninfective gastroenteritis and colitis, unspecified: Secondary | ICD-10-CM | POA: Diagnosis present

## 2018-02-15 DIAGNOSIS — E860 Dehydration: Secondary | ICD-10-CM | POA: Diagnosis present

## 2018-02-15 DIAGNOSIS — I509 Heart failure, unspecified: Secondary | ICD-10-CM

## 2018-02-15 DIAGNOSIS — Z9981 Dependence on supplemental oxygen: Secondary | ICD-10-CM

## 2018-02-15 DIAGNOSIS — Z7951 Long term (current) use of inhaled steroids: Secondary | ICD-10-CM

## 2018-02-15 LAB — COMPREHENSIVE METABOLIC PANEL
ALK PHOS: 49 U/L (ref 38–126)
ALT: 9 U/L (ref 0–44)
AST: 15 U/L (ref 15–41)
Albumin: 3.7 g/dL (ref 3.5–5.0)
Anion gap: 11 (ref 5–15)
BUN: 11 mg/dL (ref 6–20)
CALCIUM: 9.9 mg/dL (ref 8.9–10.3)
CO2: 27 mmol/L (ref 22–32)
Chloride: 100 mmol/L (ref 98–111)
Creatinine, Ser: 1.08 mg/dL (ref 0.61–1.24)
GFR calc Af Amer: >60 mL/min (ref >60)
GFR calc non Af Amer: >60 mL/min (ref >60)
Glucose, Bld: 120 mg/dL — ABNORMAL HIGH (ref 70–99)
Potassium: 3 mmol/L — ABNORMAL LOW (ref 3.5–5.1)
SODIUM: 138 mmol/L (ref 135–145)
Total Bilirubin: 0.4 mg/dL (ref 0.3–1.2)
Total Protein: 8.1 g/dL (ref 6.5–8.1)

## 2018-02-15 LAB — CBC WITH DIFFERENTIAL/PLATELET
ABS IMMATURE GRANULOCYTES: 0.51 10*3/uL — AB (ref 0.00–0.07)
Basophils Absolute: 0.1 10*3/uL (ref 0.0–0.1)
Basophils Relative: 0 %
Eosinophils Absolute: 0 10*3/uL (ref 0.0–0.5)
Eosinophils Relative: 0 %
HEMATOCRIT: 42.7 % (ref 39.0–52.0)
Hemoglobin: 14 g/dL (ref 13.0–17.0)
Immature Granulocytes: 2 %
Lymphocytes Relative: 8 %
Lymphs Abs: 1.6 10*3/uL (ref 0.7–4.0)
MCH: 27.6 pg (ref 26.0–34.0)
MCHC: 32.8 g/dL (ref 30.0–36.0)
MCV: 84.1 fL (ref 80.0–100.0)
MONO ABS: 0.9 10*3/uL (ref 0.1–1.0)
Monocytes Relative: 4 %
Neutro Abs: 18.3 10*3/uL — ABNORMAL HIGH (ref 1.7–7.7)
Neutrophils Relative %: 86 %
Platelets: 274 10*3/uL (ref 150–400)
RBC: 5.08 MIL/uL (ref 4.22–5.81)
RDW: 13.8 % (ref 11.5–15.5)
WBC: 21.4 10*3/uL — ABNORMAL HIGH (ref 4.0–10.5)
nRBC: 0 % (ref 0.0–0.2)

## 2018-02-15 LAB — C DIFFICILE QUICK SCREEN W PCR REFLEX
C Diff antigen: NEGATIVE
C Diff interpretation: NOT DETECTED
C Diff toxin: NEGATIVE

## 2018-02-15 LAB — GASTROINTESTINAL PANEL BY PCR, STOOL (REPLACES STOOL CULTURE)
ADENOVIRUS F40/41: NOT DETECTED
ASTROVIRUS: NOT DETECTED
CYCLOSPORA CAYETANENSIS: NOT DETECTED
Campylobacter species: NOT DETECTED
Cryptosporidium: NOT DETECTED
Entamoeba histolytica: NOT DETECTED
Enteroaggregative E coli (EAEC): NOT DETECTED
Enteropathogenic E coli (EPEC): NOT DETECTED
Enterotoxigenic E coli (ETEC): NOT DETECTED
Giardia lamblia: NOT DETECTED
Norovirus GI/GII: NOT DETECTED
Plesimonas shigelloides: NOT DETECTED
Rotavirus A: NOT DETECTED
Salmonella species: NOT DETECTED
Sapovirus (I, II, IV, and V): DETECTED — AB
Shiga like toxin producing E coli (STEC): NOT DETECTED
Shigella/Enteroinvasive E coli (EIEC): NOT DETECTED
VIBRIO SPECIES: NOT DETECTED
Vibrio cholerae: NOT DETECTED
Yersinia enterocolitica: NOT DETECTED

## 2018-02-15 LAB — TROPONIN I: Troponin I: <0.03 ng/mL (ref <0.03)

## 2018-02-15 MED ORDER — ONDANSETRON HCL 4 MG PO TABS: 4.0000 mg | ORAL_TABLET | Freq: Three times a day (TID) | ORAL | Status: DC | PRN

## 2018-02-15 MED ORDER — HYDROMORPHONE HCL 2 MG PO TABS
2.0000 mg | ORAL_TABLET | ORAL | Status: DC | PRN
  Administered 2018-02-16 – 2018-02-18 (×5): 2 mg via ORAL
  Filled 2018-02-15 (×5): qty 1

## 2018-02-15 MED ORDER — GABAPENTIN 100 MG PO CAPS
200.0000 mg | ORAL_CAPSULE | Freq: Three times a day (TID) | ORAL | Status: DC
  Administered 2018-02-15 – 2018-02-18 (×10): 200 mg via ORAL
  Filled 2018-02-15 (×10): qty 2

## 2018-02-15 MED ORDER — SODIUM CHLORIDE 0.9 % IV SOLN: 500.0000 mg | Freq: Once | INTRAVENOUS | Status: DC

## 2018-02-15 MED ORDER — ALPRAZOLAM 0.5 MG PO TABS
0.5000 mg | ORAL_TABLET | Freq: Three times a day (TID) | ORAL | Status: DC | PRN
  Administered 2018-02-15 – 2018-02-17 (×5): 0.5 mg via ORAL
  Filled 2018-02-15 (×5): qty 1

## 2018-02-15 MED ORDER — IPRATROPIUM-ALBUTEROL 0.5-2.5 (3) MG/3ML IN SOLN: 3.0000 mL | RESPIRATORY_TRACT | Status: DC | PRN

## 2018-02-15 MED ORDER — TRAMADOL HCL 50 MG PO TABS
50.0000 mg | ORAL_TABLET | Freq: Four times a day (QID) | ORAL | Status: DC | PRN
  Administered 2018-02-15: 50 mg via ORAL
  Filled 2018-02-15 (×2): qty 1

## 2018-02-15 MED ORDER — GUAIFENESIN-CODEINE 100-10 MG/5ML PO SOLN: 5.0000 mL | ORAL | Status: DC | PRN

## 2018-02-15 MED ORDER — ATORVASTATIN CALCIUM 20 MG PO TABS
20.0000 mg | ORAL_TABLET | Freq: Every day | ORAL | Status: DC
  Administered 2018-02-16 – 2018-02-18 (×3): 20 mg via ORAL
  Filled 2018-02-15 (×3): qty 1

## 2018-02-15 MED ORDER — ASPIRIN EC 325 MG PO TBEC
325.0000 mg | DELAYED_RELEASE_TABLET | Freq: Every day | ORAL | Status: DC
  Administered 2018-02-16 – 2018-02-18 (×3): 325 mg via ORAL
  Filled 2018-02-15 (×3): qty 1

## 2018-02-15 MED ORDER — MOMETASONE FURO-FORMOTEROL FUM 200-5 MCG/ACT IN AERO
2.0000 | INHALATION_SPRAY | Freq: Two times a day (BID) | RESPIRATORY_TRACT | Status: DC
  Administered 2018-02-15 – 2018-02-18 (×6): 2 via RESPIRATORY_TRACT
  Filled 2018-02-15: qty 8.8

## 2018-02-15 MED ORDER — ACETAMINOPHEN 650 MG RE SUPP: 650.0000 mg | Freq: Four times a day (QID) | RECTAL | Status: DC | PRN

## 2018-02-15 MED ORDER — IOPAMIDOL (ISOVUE-300) INJECTION 61%
30.0000 mL | Freq: Once | INTRAVENOUS | Status: AC | PRN
  Administered 2018-02-15: 30 mL via ORAL

## 2018-02-15 MED ORDER — TIOTROPIUM BROMIDE MONOHYDRATE 18 MCG IN CAPS
18.0000 ug | ORAL_CAPSULE | Freq: Every day | RESPIRATORY_TRACT | Status: DC
  Administered 2018-02-16 – 2018-02-18 (×3): 18 ug via RESPIRATORY_TRACT
  Filled 2018-02-15: qty 5

## 2018-02-15 MED ORDER — MEGESTROL ACETATE 20 MG PO TABS
20.0000 mg | ORAL_TABLET | Freq: Every day | ORAL | Status: DC
  Administered 2018-02-16 – 2018-02-18 (×3): 20 mg via ORAL
  Filled 2018-02-15 (×3): qty 1

## 2018-02-15 MED ORDER — SODIUM CHLORIDE 0.9 % IV BOLUS
1000.0000 mL | Freq: Once | INTRAVENOUS | Status: AC
  Administered 2018-02-15: 1000 mL via INTRAVENOUS

## 2018-02-15 MED ORDER — SODIUM CHLORIDE 0.9 % IV SOLN
1.0000 g | Freq: Once | INTRAVENOUS | Status: DC
  Administered 2018-02-15: 1 g via INTRAVENOUS
  Filled 2018-02-15: qty 10

## 2018-02-15 MED ORDER — ONDANSETRON HCL 4 MG/2ML IJ SOLN
INTRAMUSCULAR | Status: AC
  Administered 2018-02-15: 4 mg via INTRAVENOUS
  Filled 2018-02-15: qty 2

## 2018-02-15 MED ORDER — IOHEXOL 300 MG/ML  SOLN
100.0000 mL | Freq: Once | INTRAMUSCULAR | Status: AC | PRN
  Administered 2018-02-15: 100 mL via INTRAVENOUS

## 2018-02-15 MED ORDER — ENOXAPARIN SODIUM 40 MG/0.4ML ~~LOC~~ SOLN
40.0000 mg | SUBCUTANEOUS | Status: DC
  Administered 2018-02-15 – 2018-02-17 (×3): 40 mg via SUBCUTANEOUS
  Filled 2018-02-15 (×3): qty 0.4

## 2018-02-15 MED ORDER — ALBUTEROL SULFATE (2.5 MG/3ML) 0.083% IN NEBU: 3.0000 mL | INHALATION_SOLUTION | Freq: Four times a day (QID) | RESPIRATORY_TRACT | Status: DC | PRN

## 2018-02-15 MED ORDER — ACETAMINOPHEN 325 MG PO TABS
650.0000 mg | ORAL_TABLET | Freq: Four times a day (QID) | ORAL | Status: DC | PRN
  Administered 2018-02-17: 07:00:00 650 mg via ORAL
  Filled 2018-02-15: qty 2

## 2018-02-15 MED ORDER — POTASSIUM CHLORIDE IN NACL 20-0.9 MEQ/L-% IV SOLN
INTRAVENOUS | Status: DC
  Administered 2018-02-15 – 2018-02-17 (×4): via INTRAVENOUS
  Filled 2018-02-15 (×6): qty 1000

## 2018-02-15 MED ORDER — FENTANYL 25 MCG/HR TD PT72
1.0000 | MEDICATED_PATCH | TRANSDERMAL | Status: DC
  Administered 2018-02-15 – 2018-02-18 (×2): 1 via TRANSDERMAL
  Filled 2018-02-15 (×3): qty 1

## 2018-02-15 MED ORDER — FENTANYL 100 MCG/HR TD PT72
1.0000 | MEDICATED_PATCH | TRANSDERMAL | Status: DC
  Administered 2018-02-15 – 2018-02-18 (×2): 1 via TRANSDERMAL
  Filled 2018-02-15 (×2): qty 1

## 2018-02-15 MED ORDER — ONDANSETRON HCL 4 MG/2ML IJ SOLN
4.0000 mg | Freq: Once | INTRAMUSCULAR | Status: AC
  Administered 2018-02-15: 4 mg via INTRAVENOUS

## 2018-02-15 MED ORDER — ONDANSETRON HCL 4 MG/2ML IJ SOLN
4.0000 mg | Freq: Four times a day (QID) | INTRAMUSCULAR | Status: DC | PRN
  Administered 2018-02-15: 4 mg via INTRAVENOUS
  Filled 2018-02-15: qty 2

## 2018-02-15 MED ORDER — DULOXETINE HCL 30 MG PO CPEP
30.0000 mg | ORAL_CAPSULE | Freq: Every day | ORAL | Status: DC
  Administered 2018-02-15 – 2018-02-17 (×3): 30 mg via ORAL
  Filled 2018-02-15 (×3): qty 1

## 2018-02-15 MED ORDER — ONDANSETRON HCL 4 MG PO TABS: 4.0000 mg | ORAL_TABLET | Freq: Four times a day (QID) | ORAL | Status: DC | PRN

## 2018-02-15 NOTE — ED Notes (Signed)
Pt cleaned by this RN, new brief applied. Noted redness to pt's bottom.

## 2018-02-15 NOTE — ED Triage Notes (Signed)
Pt arrived from home with increasing weakness, nausea, and dizziness. Pt has been on chemo for about 5 months for multiple myeloma. Pt is on 3L oxygen at home. Pt is A&Ox4, pleasant but c/o of increasing diarrhea and abdominal pain.

## 2018-02-15 NOTE — Progress Notes (Signed)
Family Meeting Note  Advance Directive:no  Today a meeting took place with the pt in ER  Patient being admitted with acute gastroenteritis and weakness with abdominal pain found to have viral gastroenteritis. Patient has multiple myeloma on chemo. Discuss code status patient wants to be full code. No family in the ER. Time spent during discussion [place], MD

## 2018-02-15 NOTE — ED Notes (Signed)
Patient transported to CT 

## 2018-02-15 NOTE — ED Notes (Signed)
Pt cleaned by this RN. Linens and brief changed.

## 2018-02-15 NOTE — Progress Notes (Signed)
Pt complaining of not feeling "quite right", states he cant explain what he's feeling and that he feels a bit anxious, checked VS-WNL, radial pulse felt irregular, pt denies chest pain, denies pressure, contacted Dr Jannifer Franklin with the above information and request telemetry orders, new order for telemetry monitor placed

## 2018-02-15 NOTE — Plan of Care (Signed)
Pt admitted from the ED. Mews score 3 upon admission. Not an acute change. SBP in the 90's, HR up to 124, Pt on 3L O2 as at home.  IVF initiated.  2xwatery stools since admission.

## 2018-02-15 NOTE — ED Notes (Signed)
Pt cleaned and repositioned due to episode of stool incontinents at this time

## 2018-02-15 NOTE — ED Provider Notes (Signed)
[facility] Emergency Department Provider Note  Time seen: 11:49 AM  I have reviewed the triage vital signs and the nursing notes.   HISTORY  Chief Complaint Weakness    HPI [name] is a 58 y.o. male the past medical history of anxiety, CHF, COPD, gastric reflux, multiple myeloma on chemotherapy, presents to the emergency department for generalized weakness fatigue and diarrhea.  According to the patient he last received chemotherapy on Wednesday, states since last night he has been experiencing left lower quadrant abdominal pain with profuse diarrhea.  Patient estimates 20+ bowel movements since yesterday.  Denies any black or bloody stool.  Denies any nausea or vomiting.  Patient does report subjective fever since last night, afebrile upon arrival to the emergency department.  Describes his abdominal pain is mild to moderate aching cramping pain in the left side of his abdomen.   Past Medical History:  Diagnosis Date  . Anxiety   . Arthritis   . Atrial septal defect   . CHF (congestive heart failure) ([place])   . COPD (chronic obstructive pulmonary disease) ([place])   . Coronary artery disease   . Dysrhythmias   . GERD (gastroesophageal reflux disease)   . Hyperlipidemia   . Multiple myeloma ([place])   . Multiple myeloma ([place])   . Myocardial infarction ([place])   . Substance abuse [facility])     Patient Active Problem List   Diagnosis Date Noted  . Fatigue due to treatment 09/07/2016  . Palliative care by specialist   . Advance care planning   . Other insomnia   . Protein-calorie malnutrition, severe 05/25/2016  . Aspiration pneumonia ([place]) 05/23/2016  . Cough in adult 05/05/2016  . Multiple myeloma in relapse ([place]) 09/09/2015  . Congestive heart failure ([place]) 01/06/2015  . Cardiomyopathy ([place]) 01/06/2015  . Nonsustained ventricular tachycardia ([place]) 01/06/2015  . Malnutrition of moderate degree 12/24/2014  . Pressure ulcer 12/24/2014  . Cellulitis of  second finger of left hand   . Sepsis ([place]) 12/23/2014  . 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 Arteriosclerosis of coronary artery 08/22/2011  . Cardiac conduction disorder 08/22/2011  . Myocardial infarction ([place]) 08/22/2011  . Cardiac arrhythmia 08/22/2011  . Anxiety 08/21/2011  . Acid reflux 08/21/2011  . Chronic obstructive pulmonary disease ([place]) 07/26/2011  . Nicotine addiction 07/26/2011    Past Surgical History:  Procedure Laterality Date  . CARDIAC DEFIBRILLATOR PLACEMENT      Prior to Admission medications   Medication Sig Start Date End Date Taking? Authorizing Provider  acetaminophen (TYLENOL) 325 MG tablet Take 2 tablets (650 mg total) by mouth every 6 (six) hours as needed for mild pain (or Fever >/= 101). 05/28/16   Gouru, Illene Silver, MD  albuterol (PROVENTIL HFA;VENTOLIN HFA) 108 (90 Base) MCG/ACT inhaler Inhale 2 puffs into the lungs every 6 (six) hours as needed for wheezing or shortness of breath. 04/09/17   Flora Lipps, MD  ALPRAZolam Duanne Moron) 0.5 MG tablet Take 1 tablet (0.5 mg total) by mouth 3 (three) times daily as needed for anxiety. 02/11/18   Cammie Sickle, MD  aspirin 325 MG EC tablet Take 325 mg by mouth daily.    [provider]  atorvastatin (LIPITOR) 20 MG tablet Take 20 mg by mouth daily. 10/11/16   [provider]  dexamethasone (DECADRON) 4 MG tablet TAKE 3 TABLETS (12 MG TOTAL) BY MOUTH ONCE A WEEK. TAKE WITH BREAKFAST 01/18/18   Lloyd Huger, MD  DULoxetine (CYMBALTA) 30 MG capsule  TAKE ONE CAPSULE BY MOUTH EVERY DAY 11/21/17   Cammie Sickle, MD  feeding supplement, ENSURE ENLIVE, (ENSURE ENLIVE) LIQD Take 237 mLs by mouth 3 (three) times daily between meals. Patient not taking: Reported on 01/30/2018 05/28/16   Nicholes Mango, MD  fentaNYL (DURAGESIC - DOSED MCG/HR) 100 MCG/HR Place 1 patch (100 mcg total) onto the skin every other day. Use with a 25 mcg patch for total dose of 125 mcg 01/27/18    Verlon Au, NP  fentaNYL (DURAGESIC - DOSED MCG/HR) 25 MCG/HR patch Place 1 patch (25 mcg total) onto the skin every other day. Use with a 100 mcg patch for total dose of 125 mcg 01/21/18   Verlon Au, NP  Fluticasone-Salmeterol (ADVAIR DISKUS) 250-50 MCG/DOSE AEPB Inhale 1 puff into the lungs 2 (two) times daily. 07/20/17 07/20/18  Flora Lipps, MD  furosemide (LASIX) 20 MG tablet Take 20 mg by mouth daily. 09/01/16   [provider]  gabapentin (NEURONTIN) 100 MG capsule Take 2 capsules (200 mg total) by mouth 3 (three) times daily. 11/27/17   Cammie Sickle, MD  guaiFENesin-codeine 100-10 MG/5ML syrup Take 5 mLs by mouth every 4 (four) hours as needed for cough. 12/12/17   Flora Lipps, MD  HYDROmorphone (DILAUDID) 2 MG tablet Take 1 tablet (2 mg total) by mouth every 4 (four) hours as needed for severe pain. 02/04/18   Verlon Au, NP  ipratropium-albuterol (DUONEB) 0.5-2.5 (3) MG/3ML SOLN Take 3 mLs by nebulization every 4 (four) hours as needed. 04/09/17   Flora Lipps, MD  megestrol (MEGACE) 20 MG tablet TAKE ONE TABLET BY MOUTH ONCE DAILY 11/21/17   Cammie Sickle, MD  metoprolol tartrate (LOPRESSOR) 50 MG tablet Take 50 mg by mouth 2 (two) times daily. 08/21/16   [provider]  NINLARO 4 MG capsule TAKE 1 CAPSULE (4 MG TOTAL) BY MOUTH ONCE A WEEK. TAKE FOR 3 WEEKS ON, THEN 1 WEEK OFF. TAKE ON EMPTY STOMACH 1HR BEFORE OR 2HRS AFTER FOOD. 01/18/18   Lloyd Huger, MD  ondansetron (ZOFRAN) 8 MG tablet 1 pill every 8 hours as needed. 10/31/17   Cammie Sickle, MD  OXYGEN Inhale 3 L into the lungs daily.    [provider]  polyethylene glycol (MIRALAX / GLYCOLAX) packet Take 17 g by mouth daily. 05/29/16   Gouru, Illene Silver, MD  tiotropium (SPIRIVA HANDIHALER) 18 MCG inhalation capsule Place 1 capsule (18 mcg total) into inhaler and inhale daily. 04/09/17 04/09/18  Flora Lipps, MD    No Known Allergies  Family History  Problem Relation  Age of Onset  . COPD Mother   . CAD Mother   . Heart attack Father   . Prostate cancer Maternal Grandfather   . Kidney cancer Neg Hx   . Bladder Cancer Neg Hx     Social History Social History   Tobacco Use  . Smoking status: Current Every Day Smoker    Packs/day: 0.50    Years: 40.00    Pack years: 20.00    Types: Cigarettes  . Smokeless tobacco: Never Used  Substance Use Topics  . Alcohol use: No    Alcohol/week: 0.0 standard drinks  . Drug use: No    Review of Systems Constitutional: Subjective fever last night.  Positive for generalized fatigue/weakness Cardiovascular: Negative for chest pain. Respiratory: Negative for shortness of breath. Gastrointestinal: Mild left-sided abdominal pain.  Negative for nausea vomiting.  Positive for profuse diarrhea. Genitourinary: Negative for urinary compaints Musculoskeletal:  Negative for musculoskeletal complaints Skin: Negative for skin complaints  Neurological: Negative for headache All other ROS negative  ____________________________________________   PHYSICAL EXAM:  VITAL SIGNS: ED Triage Vitals  Enc Vitals Group     BP 02/15/18 1140 100/81     Pulse Rate 02/15/18 1140 (!) 111     Resp 02/15/18 1140 18     Temp 02/15/18 1148 97.6 F (36.4 C)     Temp Source 02/15/18 1148 Oral     SpO2 02/15/18 1140 95 %     Weight 02/15/18 1142 131 lb (59.4 kg)     Height 02/15/18 1142 6' (1.829 m)     Head Circumference --      Peak Flow --      Pain Score 02/15/18 1140 8     Pain Loc --      Pain Edu? --      Excl. in [place]? --    Constitutional: Alert and oriented. Well appearing and in no distress. Eyes: Normal exam ENT   Head: Normocephalic and atraumatic.   Mouth/Throat: Mucous membranes are moist. Cardiovascular: Normal rate, regular rhythm. No murmur Respiratory: Normal respiratory effort without tachypnea nor retractions. Breath sounds are clear  Gastrointestinal: Soft, mild left-sided abdominal tenderness, no  rebound guarding or distention.  Abdomen otherwise benign. Musculoskeletal: Nontender with normal range of motion in all extremities. Neurologic:  Normal speech and language. No gross focal neurologic deficits  Skin:  Skin is warm, dry and intact.  Psychiatric: Mood and affect are normal.   ____________________________________________    EKG  EKG viewed and interpreted by myself shows sinus tachycardia 111 bpm with a normal axis, slight QTC prolongation, slightly widened QRS, nonspecific ST changes.  ____________________________________________    RADIOLOGY  CT scan shows possible pneumonia.  Also shows diffuse enteritis. ____________________________________________   INITIAL IMPRESSION / ASSESSMENT AND PLAN / ED COURSE  Pertinent labs & imaging results that were available during my care of the patient were reviewed by me and considered in my medical decision making (see chart for details).  Patient presents to the emergency department for left-sided abdominal discomfort generalized fatigue weakness and profuse diarrhea since last night.  Differential would include colitis, diverticulitis, C. difficile colitis, infectious diarrhea, chemotherapy related diarrhea, dehydration, metabolic abnormality.  We will check labs, IV hydrate, proceed with CT imaging of abdomen/pelvis to further evaluate.  We will obtain stool antigen testing as well as C. difficile testing to further differentiate.  CT shows multiple findings including diffuse enteritis, possible pneumonia.  I discussed the patient with oncology Dr. Rogue Bussing, patient is not currently on Neupogen/Neulasta.  Has a leukocytosis of 21,400.  We will send blood cultures, start on antibiotics.  We will continue with IV hydration and admit to the hospitalist service.  GI panel positive for [place]o virus.  ____________________________________________   FINAL CLINICAL IMPRESSION(S) / ED DIAGNOSES  Generalized  weakness Diarrhea Abdominal pain Gastroenteritis Pneumonia   Harvest Dark, MD 02/15/18 1351

## 2018-02-15 NOTE — H&P (Signed)
[place] [facility] [place] NAME: [name]    MR#:  [id number]  DATE OF BIRTH:  1960-05-25  DATE OF ADMISSION:  02/15/2018  PRIMARY CARE PHYSICIAN: Petra Kuba, MD   REQUESTING/REFERRING PHYSICIAN: Dr Kerman Passey  CHIEF COMPLAINT:  diarrhea and abdominal pain for two days.  HISTORY OF PRESENT ILLNESS:  [name]  is a 58 y.o. male with a known history of people myeloma on chemo, COPD on chronic home oxygen, Gerd, hyperlipidemia, coronary artery disease comes to the emergency room with increasing weakness nausea and diarrhea for two days. pt is found clinically to be dehydrated. Stool PCR showed Sapovirus. Patient is having watery diarrhea. He is had already five episode of it in the emergency room.  Patient is being admitted for acute gastroenteritis secondary to virus in the setting of multiple myeloma/immunosuppression.   PAST MEDICAL HISTORY:   Past Medical History:  Diagnosis Date  . Anxiety   . Arthritis   . Atrial septal defect   . CHF (congestive heart failure) ([place])   . COPD (chronic obstructive pulmonary disease) ([place])   . Coronary artery disease   . Dysrhythmias   . GERD (gastroesophageal reflux disease)   . Hyperlipidemia   . Multiple myeloma ([place])   . Multiple myeloma ([place])   . Myocardial infarction ([place])   . Substance abuse ([place])     PAST SURGICAL HISTOIRY:   Past Surgical History:  Procedure Laterality Date  . CARDIAC DEFIBRILLATOR PLACEMENT      SOCIAL HISTORY:   Social History   Tobacco Use  . Smoking status: Current Every Day Smoker    Packs/day: 0.50    Years: 40.00    Pack years: 20.00    Types: Cigarettes  . Smokeless tobacco: Never Used  Substance Use Topics  . Alcohol use: No    Alcohol/week: 0.0 standard drinks    FAMILY HISTORY:   Family History  Problem Relation Age of Onset  . COPD Mother   . CAD Mother   . Heart attack Father   . Prostate cancer Maternal  Grandfather   . Kidney cancer Neg Hx   . Bladder Cancer Neg Hx     DRUG ALLERGIES:  No Known Allergies  REVIEW OF SYSTEMS:  Review of Systems  Constitutional: Negative for chills, fever and weight loss.  HENT: Negative for ear discharge, ear pain and nosebleeds.   Eyes: Negative for blurred vision, pain and discharge.  Respiratory: Negative for sputum production, shortness of breath, wheezing and stridor.   Cardiovascular: Negative for chest pain, palpitations, orthopnea and PND.  Gastrointestinal: Positive for abdominal pain, diarrhea and nausea. Negative for vomiting.  Genitourinary: Negative for frequency and urgency.  Musculoskeletal: Negative for back pain and joint pain.  Neurological: Positive for weakness. Negative for sensory change, speech change and focal weakness.  Psychiatric/Behavioral: Negative for depression and hallucinations. The patient is not nervous/anxious.      MEDICATIONS AT HOME:   Prior to Admission medications   Medication Sig Start Date End Date Taking? Authorizing Provider  acetaminophen (TYLENOL) 325 MG tablet Take 2 tablets (650 mg total) by mouth every 6 (six) hours as needed for mild pain (or Fever >/= 101). 05/28/16   Gouru, Illene Silver, MD  albuterol (PROVENTIL HFA;VENTOLIN HFA) 108 (90 Base) MCG/ACT inhaler Inhale 2 puffs into the lungs every 6 (six) hours as needed for wheezing or shortness of breath. 04/09/17   Flora Lipps, MD  ALPRAZolam Duanne Moron) 0.5 MG tablet Take  1 tablet (0.5 mg total) by mouth 3 (three) times daily as needed for anxiety. 02/11/18   Cammie Sickle, MD  aspirin 325 MG EC tablet Take 325 mg by mouth daily.    [provider]  atorvastatin (LIPITOR) 20 MG tablet Take 20 mg by mouth daily. 10/11/16   [provider]  dexamethasone (DECADRON) 4 MG tablet TAKE 3 TABLETS (12 MG TOTAL) BY MOUTH ONCE A WEEK. TAKE WITH BREAKFAST 01/18/18   Lloyd Huger, MD  DULoxetine (CYMBALTA) 30 MG capsule TAKE ONE CAPSULE BY  MOUTH EVERY DAY 11/21/17   Cammie Sickle, MD  feeding supplement, ENSURE ENLIVE, (ENSURE ENLIVE) LIQD Take 237 mLs by mouth 3 (three) times daily between meals. Patient not taking: Reported on 01/30/2018 05/28/16   Nicholes Mango, MD  fentaNYL (DURAGESIC - DOSED MCG/HR) 100 MCG/HR Place 1 patch (100 mcg total) onto the skin every other day. Use with a 25 mcg patch for total dose of 125 mcg 01/27/18   Verlon Au, NP  fentaNYL (DURAGESIC - DOSED MCG/HR) 25 MCG/HR patch Place 1 patch (25 mcg total) onto the skin every other day. Use with a 100 mcg patch for total dose of 125 mcg 01/21/18   Verlon Au, NP  Fluticasone-Salmeterol (ADVAIR DISKUS) 250-50 MCG/DOSE AEPB Inhale 1 puff into the lungs 2 (two) times daily. 07/20/17 07/20/18  Flora Lipps, MD  furosemide (LASIX) 20 MG tablet Take 20 mg by mouth daily. 09/01/16   [provider]  gabapentin (NEURONTIN) 100 MG capsule Take 2 capsules (200 mg total) by mouth 3 (three) times daily. 11/27/17   Cammie Sickle, MD  guaiFENesin-codeine 100-10 MG/5ML syrup Take 5 mLs by mouth every 4 (four) hours as needed for cough. 12/12/17   Flora Lipps, MD  HYDROmorphone (DILAUDID) 2 MG tablet Take 1 tablet (2 mg total) by mouth every 4 (four) hours as needed for severe pain. 02/04/18   Verlon Au, NP  ipratropium-albuterol (DUONEB) 0.5-2.5 (3) MG/3ML SOLN Take 3 mLs by nebulization every 4 (four) hours as needed. 04/09/17   Flora Lipps, MD  megestrol (MEGACE) 20 MG tablet TAKE ONE TABLET BY MOUTH ONCE DAILY 11/21/17   Cammie Sickle, MD  metoprolol tartrate (LOPRESSOR) 50 MG tablet Take 50 mg by mouth 2 (two) times daily. 08/21/16   [provider]  Multiple Vitamins-Minerals (MULTIVITAMIN ADULT) TABS Take 1 tablet by mouth daily.    [provider]  NINLARO 4 MG capsule TAKE 1 CAPSULE (4 MG TOTAL) BY MOUTH ONCE A WEEK. TAKE FOR 3 WEEKS ON, THEN 1 WEEK OFF. TAKE ON EMPTY STOMACH 1HR BEFORE OR 2HRS AFTER FOOD. 01/18/18    Lloyd Huger, MD  ondansetron (ZOFRAN) 8 MG tablet 1 pill every 8 hours as needed. 10/31/17   Cammie Sickle, MD  OXYGEN Inhale 3 L into the lungs daily.    [provider]  polyethylene glycol (MIRALAX / GLYCOLAX) packet Take 17 g by mouth daily. 05/29/16   Gouru, Illene Silver, MD  tiotropium (SPIRIVA HANDIHALER) 18 MCG inhalation capsule Place 1 capsule (18 mcg total) into inhaler and inhale daily. 04/09/17 04/09/18  Flora Lipps, MD      VITAL SIGNS:  Blood pressure 107/77, pulse 95, temperature 97.6 F (36.4 C), temperature source Oral, resp. rate (!) 22, height 6' (1.829 m), weight 59.4 kg, SpO2 94 %.  PHYSICAL EXAMINATION:  GENERAL:  58 y.o.-year-old patient lying in the bed with no acute distress. Cachectic malnourished EYES: Pupils equal, round,  reactive to light and accommodation. No scleral icterus. Extraocular muscles intact.  HEENT: Head atraumatic, normocephalic. Oropharynx and nasopharynx clear.  NECK:  Supple, no jugular venous distention. No thyroid enlargement, no tenderness.  LUNGS: Normal breath sounds bilaterally, no wheezing, rales,rhonchi or crepitation. No use of accessory muscles of respiration.  CARDIOVASCULAR: S1, S2 normal. No murmurs, rubs, or gallops.  ABDOMEN: Soft, diffusely tender, nondistended. Bowel sounds present. No organomegaly or mass.  EXTREMITIES: No pedal edema, cyanosis, or clubbing.  NEUROLOGIC: Cranial nerves II through XII are intact. Muscle strength 5/5 in all extremities. Sensation intact. Gait not checked.  PSYCHIATRIC: The patient is alert and oriented x 3.  SKIN: No obvious rash, lesion, or ulcer.   LABORATORY PANEL:   CBC Recent Labs  Lab 02/15/18 1152  WBC 21.4*  HGB 14.0  HCT 42.7  PLT 274   ------------------------------------------------------------------------------------------------------------------  Chemistries  Recent Labs  Lab 02/15/18 1152  NA 138  K 3.0*  CL 100  CO2 27  GLUCOSE 120*  BUN 11   CREATININE 1.08  CALCIUM 9.9  AST 15  ALT 9  ALKPHOS 49  BILITOT 0.4   ------------------------------------------------------------------------------------------------------------------  Cardiac Enzymes Recent Labs  Lab 02/15/18 1152  TROPONINI <0.03   ------------------------------------------------------------------------------------------------------------------  RADIOLOGY:  Ct Abdomen Pelvis W Contrast  Result Date: 02/15/2018 CLINICAL DATA:  Elevated white blood cell count EXAM: CT ABDOMEN AND PELVIS WITH CONTRAST TECHNIQUE: Multidetector CT imaging of the abdomen and pelvis was performed using the standard protocol following bolus administration of intravenous contrast. CONTRAST:  150m OMNIPAQUE IOHEXOL 300 MG/ML  SOLN COMPARISON:  11/05/2017 and 05/23/2016 FINDINGS: Lower chest: There is consolidation at the posterior right lung base centrilobular nodules with a tree-in-bud pattern are present at both posterior lung bases. Hepatobiliary: Tiny hypodensities in the left lobe of the liver are stable. Gallbladder is decompressed. Pancreas: Unremarkable Spleen: Unremarkable Adrenals/Urinary Tract: Adrenal glands are within normal limits. Tiny hypodensity in the lower pole of both kidneys are not significantly changed. Bladder is within normal limits. Stomach/Bowel: There is narrowing of the duodenum as it passes posterior to the SMA. There is also dilatation of the proximal duodenum. Fluid-filled loops of small and large bowel are present in the abdomen without disproportionate dilatation. The transverse and descending colon are relatively decompressed with avid enhancement of the mucosa. No pneumatosis. No stranding in the adjacent fat. Several fluid-filled small bowel loops in the mid and lower abdomen exhibit prominence of the folds. Again, there is no pneumatosis or stranding in the adjacent fat. The appendix is also borderline prominent measuring 9 mm in caliber. There is no stranding  in the adjacent fat. Vascular/Lymphatic: Atherosclerotic vascular calcifications of the aorta and iliac arteries. There is no abnormal retroperitoneal adenopathy. Reproductive: Normal prostate. Other: No free-fluid.  No free intraperitoneal gas. Musculoskeletal: Compression fractures of T12, L1, and L2 are stable. A lytic lesion in the central sacrum on image 45 is stable. Multiple suspected lytic lesions throughout the visualized spine are not significantly changed. IMPRESSION: There is consolidation at the posterior right lung base. Tree-in-bud centrilobular nodules are also present at the lung bases. These findings are worrisome for an inflammatory process and could account for the leukocytosis. Small and large bowel loops have an abnormal appearance in the abdomen. Transverse and descending colon mucosa is avidly enhancing. Lower abdominal small bowel folds are prominent. The appendix is also borderline prominent. These findings suggest a diffuse process such as enteritis or possibly volume overload. A focal inflammatory process of bowel is not excluded.  Dilatation of the duodenum with narrowing has a crosses posterior to the SMA. Nutcracker syndrome is not excluded. Multiple lytic lesions are not significantly changed. Thoracolumbar compression fractures are also stable. Aortic Atherosclerosis (ICD10-I70.0). Electronically Signed   By: Marybelle Killings M.D.   On: 02/15/2018 13:26    EKG:    IMPRESSION AND PLAN:   [name]  is a 58 y.o. male with a known history of people myeloma on chemo, COPD on chronic home oxygen, Gerd, hyperlipidemia, coronary artery disease comes to the emergency room with increasing weakness nausea and diarrhea for two days. pt is found clinically to be dehydrated. Stool PCR showed Sapovirus.  1. Acute gastroenteritis appears viral in the setting of multiple myeloma/immunosuppression -admit to medical floor -supportive treatment with IV fluids, full liquid diet, rectal tube  given significant amount of watery diarrhea -CT abdomen shows dilatation of duodenum with inflammatory process suspicious for enteritis -G.I. consultation if needed  2. Hypokalemia replete potassium  3. Malnourishment/protein calorie -dietitian to see  4. Leukocytosis due to number one  5. Chronic respiratory failure/COPD on home oxygen -continue PRN nebs -COPD stable  6. DVT prophylaxis subcu Lovenox    All the records are reviewed and case discussed with ED provider.   CODE STATUS: full  TOTAL TIME TAKING CARE OF THIS PATIENT: *45* minutes.    Fritzi Mandes M.D on 02/15/2018 at 2:43 PM  Between 7am to 6pm - Pager - [phone]  After 6pm go to [email or web address] - password [email or web address] [facility]  Office  [phone]  CC: Primary care physician; Petra Kuba, MD

## 2018-02-15 NOTE — ED Notes (Signed)
Pt transported to room 128 

## 2018-02-16 ENCOUNTER — Inpatient Hospital Stay: Payer: Medicaid Other

## 2018-02-16 LAB — URINALYSIS, COMPLETE (UACMP) WITH MICROSCOPIC
Bilirubin Urine: NEGATIVE
GLUCOSE, UA: NEGATIVE mg/dL
HGB URINE DIPSTICK: NEGATIVE
Ketones, ur: NEGATIVE mg/dL
Leukocytes, UA: NEGATIVE
Nitrite: NEGATIVE
PROTEIN: NEGATIVE mg/dL
Specific Gravity, Urine: >1.046 — ABNORMAL HIGH (ref 1.005–1.030)
Squamous Epithelial / HPF: NONE SEEN (ref 0–5)
pH: 6 (ref 5.0–8.0)

## 2018-02-16 LAB — CBC
HEMATOCRIT: 37.5 % — AB (ref 39.0–52.0)
Hemoglobin: 12.6 g/dL — ABNORMAL LOW (ref 13.0–17.0)
MCH: 27.8 pg (ref 26.0–34.0)
MCHC: 33.6 g/dL (ref 30.0–36.0)
MCV: 82.8 fL (ref 80.0–100.0)
Platelets: 232 10*3/uL (ref 150–400)
RBC: 4.53 MIL/uL (ref 4.22–5.81)
RDW: 13.8 % (ref 11.5–15.5)
WBC: 19.1 10*3/uL — AB (ref 4.0–10.5)
nRBC: 0 % (ref 0.0–0.2)

## 2018-02-16 LAB — TROPONIN I: Troponin I: <0.03 ng/mL (ref <0.03)

## 2018-02-16 LAB — PROCALCITONIN: Procalcitonin: <0.1 ng/mL

## 2018-02-16 LAB — MAGNESIUM: Magnesium: 1.7 mg/dL (ref 1.7–2.4)

## 2018-02-16 MED ORDER — MAGNESIUM SULFATE 2 GM/50ML IV SOLN
2.0000 g | Freq: Once | INTRAVENOUS | Status: AC
  Administered 2018-02-16: 03:00:00 2 g via INTRAVENOUS
  Filled 2018-02-16: qty 50

## 2018-02-16 MED ORDER — POTASSIUM CHLORIDE CRYS ER 20 MEQ PO TBCR
40.0000 meq | EXTENDED_RELEASE_TABLET | Freq: Once | ORAL | Status: AC
  Administered 2018-02-16: 40 meq via ORAL
  Filled 2018-02-16: qty 2

## 2018-02-16 MED ORDER — LOPERAMIDE HCL 2 MG PO CAPS: 2.0000 mg | ORAL_CAPSULE | Freq: Three times a day (TID) | ORAL | Status: DC | PRN

## 2018-02-16 MED ORDER — CALCIUM GLUCONATE-NACL 1-0.675 GM/50ML-% IV SOLN
1.0000 g | Freq: Once | INTRAVENOUS | Status: AC
  Administered 2018-02-16: 03:00:00 1000 mg via INTRAVENOUS
  Filled 2018-02-16: qty 50

## 2018-02-16 NOTE — Progress Notes (Signed)
   02/16/18 0136  MEWS Score  Pulse Rate (!) 115  MEWS Score  MEWS RR 0  MEWS Pulse 2  MEWS Systolic 0  MEWS LOC 0  MEWS Temp 0  MEWS Score 2  MEWS Score Color Yellow  MEWS Assessment  Is this an acute change? Yes  MEWS guidelines implemented *See Row Information* Yellow  Provider Notification  Provider Name/Title Dr Jannifer Franklin  Date Provider Notified 02/16/18  Time Provider Notified 0140  Notification Type Call  Notification Reason Change in status;Other (Comment) (HR)  Response See new orders  Date of Provider Response 02/16/18  Time of Provider Response 0140

## 2018-02-16 NOTE — Progress Notes (Signed)
PT Cancellation Note  Patient Details Name: [name] MRN: [id number] DOB: Jun 22, 1960   Cancelled Treatment:    Reason Eval/Treat Not Completed: Other (comment).  Continues to have ventricular tachycardia runs, will check on pt later to see how he is progressing.   Ramond Dial 02/16/2018, 9:19 AM  Mee Hives, PT MS Acute Rehab Dept. Number: [place] and [place]

## 2018-02-16 NOTE — Progress Notes (Signed)
Pt HR up as high as 150, ST with frequent PVCs, Dr Jannifer Franklin made aware of HR from 105-150s, pt denies chest pain or pressure just states that he feels a little anxious and cant sleep, BPs remain soft, EKG obtained and read to MD, Dr Jannifer Franklin placed orders

## 2018-02-16 NOTE — Progress Notes (Signed)
PT Cancellation Note  Patient Details Name: [name] MRN: [id number] DOB: January 01, 1961   Cancelled Treatment:    Reason Eval/Treat Not Completed: Medical issues which prohibited therapy.  Talked with nursing about the challenges of pt's care right now, and her recommendation is to recheck pt in the AM.  Return tomorrow to assess if appropriate then.   Ramond Dial 02/16/2018, 2:07 PM  Mee Hives, PT MS Acute Rehab Dept. Number: [place] and [place]

## 2018-02-16 NOTE — Progress Notes (Signed)
Patient ID: [name], male   DOB: 1960/10/17, 57 y.o.   MRN: [id number]  [facility] PROGRESS NOTE  [name] FXT:[id number] DOB: [birth year] DOA: 02/15/2018 PCP: Petra Kuba, MD  HPI/Subjective: Patient starting to feel little bit better today.  Had continuous diarrhea the other night.  Could not take it anymore.  Needed rectal tube placement yesterday in the emergency room.  Has not urinated since being in the hospital.  Objective: Vitals:   02/16/18 0622 02/16/18 1415  BP: 113/81 (!) 144/102  Pulse: 95 (!) 117  Resp:  18  Temp:  98 F (36.7 C)  SpO2:  97%    Filed Weights   02/15/18 1142  Weight: 59.4 kg    ROS: Review of Systems  Constitutional: Negative for chills and fever.  Eyes: Negative for blurred vision.  Respiratory: Negative for cough and shortness of breath.   Cardiovascular: Negative for chest pain.  Gastrointestinal: Positive for diarrhea and nausea. Negative for abdominal pain, constipation and vomiting.  Genitourinary: Negative for dysuria.  Musculoskeletal: Negative for joint pain.  Neurological: Negative for dizziness and headaches.   Exam: Physical Exam  Constitutional: He is oriented to person, place, and time.  HENT:  Nose: No mucosal edema.  Mouth/Throat: No oropharyngeal exudate or posterior oropharyngeal edema.  Eyes: Pupils are equal, round, and reactive to light. Conjunctivae, EOM and lids are normal.  Neck: No JVD present. Carotid bruit is not present. No edema present. No thyroid mass and no thyromegaly present.  Cardiovascular: S1 normal and S2 normal. Exam reveals no gallop.  No murmur heard. Pulses:      Dorsalis pedis pulses are 2+ on the right side and 2+ on the left side.  Respiratory: No respiratory distress. He has no wheezes. He has no rhonchi. He has no rales.  GI: Soft. Bowel sounds are normal. There is no abdominal tenderness.  Musculoskeletal:     Right ankle: He exhibits no swelling.     Left ankle: He  exhibits no swelling.  Lymphadenopathy:    He has no cervical adenopathy.  Neurological: He is alert and oriented to person, place, and time. No cranial nerve deficit.  Skin: Skin is warm. No rash noted. Nails show no clubbing.  Psychiatric: He has a normal mood and affect.      Data Reviewed: Basic Metabolic Panel: Recent Labs  Lab 02/15/18 1152 02/16/18 0145  NA 138  --   K 3.0*  --   CL 100  --   CO2 27  --   GLUCOSE 120*  --   BUN 11  --   CREATININE 1.08  --   CALCIUM 9.9  --   MG  --  1.7   Liver Function Tests: Recent Labs  Lab 02/15/18 1152  AST 15  ALT 9  ALKPHOS 49  BILITOT 0.4  PROT 8.1  ALBUMIN 3.7   CBC: Recent Labs  Lab 02/15/18 1152 02/16/18 0145  WBC 21.4* 19.1*  NEUTROABS 18.3*  --   HGB 14.0 12.6*  HCT 42.7 37.5*  MCV 84.1 82.8  PLT 274 232   Cardiac Enzymes: Recent Labs  Lab 02/15/18 1152 02/16/18 0145  TROPONINI <0.03 <0.03     Recent Results (from the past 240 hour(s))  Gastrointestinal Panel by PCR , Stool     Status: Abnormal   Collection Time: 02/15/18 11:52 AM  Result Value Ref Range Status   Campylobacter species NOT DETECTED NOT DETECTED Final   Plesimonas shigelloides NOT DETECTED  NOT DETECTED Final   Salmonella species NOT DETECTED NOT DETECTED Final   Yersinia enterocolitica NOT DETECTED NOT DETECTED Final   Vibrio species NOT DETECTED NOT DETECTED Final   Vibrio cholerae NOT DETECTED NOT DETECTED Final   Enteroaggregative E coli (EAEC) NOT DETECTED NOT DETECTED Final   Enteropathogenic E coli (EPEC) NOT DETECTED NOT DETECTED Final   Enterotoxigenic E coli (ETEC) NOT DETECTED NOT DETECTED Final   Shiga like toxin producing E coli (STEC) NOT DETECTED NOT DETECTED Final   Shigella/Enteroinvasive E coli (EIEC) NOT DETECTED NOT DETECTED Final   Cryptosporidium NOT DETECTED NOT DETECTED Final   Cyclospora cayetanensis NOT DETECTED NOT DETECTED Final   Entamoeba histolytica NOT DETECTED NOT DETECTED Final   Giardia  lamblia NOT DETECTED NOT DETECTED Final   Adenovirus F40/41 NOT DETECTED NOT DETECTED Final   Astrovirus NOT DETECTED NOT DETECTED Final   Norovirus GI/GII NOT DETECTED NOT DETECTED Final   Rotavirus A NOT DETECTED NOT DETECTED Final   Sapovirus (I, II, IV, and V) DETECTED (A) NOT DETECTED Final    Comment: Performed at [facility], [address] [place], [place] [address]  C difficile quick scan w PCR reflex     Status: None   Collection Time: 02/15/18 11:52 AM  Result Value Ref Range Status   C Diff antigen NEGATIVE NEGATIVE Final   C Diff toxin NEGATIVE NEGATIVE Final   C Diff interpretation No C. difficile detected.  Final    Comment: Performed at [facility], [address] [place], [place] [address]  Blood culture (routine x 2)     Status: None (Preliminary result)   Collection Time: 02/15/18  2:52 PM  Result Value Ref Range Status   Specimen Description BLOOD BLOOD RIGHT FOREARM  Final   Special Requests   Final    BOTTLES DRAWN AEROBIC AND ANAEROBIC Blood Culture results may not be optimal due to an excessive volume of blood received in culture bottles   Culture   Final    NO GROWTH < 24 HOURS Performed at [facility], [address], [place], [place] [address]    Report Status PENDING  Incomplete  Blood culture (routine x 2)     Status: None (Preliminary result)   Collection Time: 02/15/18  2:54 PM  Result Value Ref Range Status   Specimen Description BLOOD BLOOD RIGHT WRIST  Final   Special Requests   Final    BOTTLES DRAWN AEROBIC AND ANAEROBIC Blood Culture adequate volume   Culture   Final    NO GROWTH < 24 HOURS Performed at [facility], [address] [place], [place] [address]    Report Status PENDING  Incomplete     Studies: Ct Abdomen Pelvis W Contrast  Result Date: 02/15/2018 CLINICAL DATA:  Elevated white blood cell count EXAM: CT ABDOMEN AND PELVIS WITH CONTRAST TECHNIQUE: Multidetector CT imaging of the  abdomen and pelvis was performed using the standard protocol following bolus administration of intravenous contrast. CONTRAST:  119m OMNIPAQUE IOHEXOL 300 MG/ML  SOLN COMPARISON:  11/05/2017 and 05/23/2016 FINDINGS: Lower chest: There is consolidation at the posterior right lung base centrilobular nodules with a tree-in-bud pattern are present at both posterior lung bases. Hepatobiliary: Tiny hypodensities in the left lobe of the liver are stable. Gallbladder is decompressed. Pancreas: Unremarkable Spleen: Unremarkable Adrenals/Urinary Tract: Adrenal glands are within normal limits. Tiny hypodensity in the lower pole of both kidneys are not significantly changed. Bladder is within normal limits. Stomach/Bowel: There  is narrowing of the duodenum as it passes posterior to the SMA. There is also dilatation of the proximal duodenum. Fluid-filled loops of small and large bowel are present in the abdomen without disproportionate dilatation. The transverse and descending colon are relatively decompressed with avid enhancement of the mucosa. No pneumatosis. No stranding in the adjacent fat. Several fluid-filled small bowel loops in the mid and lower abdomen exhibit prominence of the folds. Again, there is no pneumatosis or stranding in the adjacent fat. The appendix is also borderline prominent measuring 9 mm in caliber. There is no stranding in the adjacent fat. Vascular/Lymphatic: Atherosclerotic vascular calcifications of the aorta and iliac arteries. There is no abnormal retroperitoneal adenopathy. Reproductive: Normal prostate. Other: No free-fluid.  No free intraperitoneal gas. Musculoskeletal: Compression fractures of T12, L1, and L2 are stable. A lytic lesion in the central sacrum on image 45 is stable. Multiple suspected lytic lesions throughout the visualized spine are not significantly changed. IMPRESSION: There is consolidation at the posterior right lung base. Tree-in-bud centrilobular nodules are also present  at the lung bases. These findings are worrisome for an inflammatory process and could account for the leukocytosis. Small and large bowel loops have an abnormal appearance in the abdomen. Transverse and descending colon mucosa is avidly enhancing. Lower abdominal small bowel folds are prominent. The appendix is also borderline prominent. These findings suggest a diffuse process such as enteritis or possibly volume overload. A focal inflammatory process of bowel is not excluded. Dilatation of the duodenum with narrowing has a crosses posterior to the SMA. Nutcracker syndrome is not excluded. Multiple lytic lesions are not significantly changed. Thoracolumbar compression fractures are also stable. Aortic Atherosclerosis (ICD10-I70.0). Electronically Signed   By: Marybelle Killings M.D.   On: 02/15/2018 13:26   Dg Chest Port 1 View  Result Date: 02/16/2018 CLINICAL DATA:  CHF. EXAM: PORTABLE CHEST 1 VIEW COMPARISON:  Radiographs 01/03/2018. Lung bases from abdominal CT earlier this day. FINDINGS: Single lead left-sided pacemaker in place. Unchanged heart size and mediastinal contours. No pulmonary edema or large pleural effusion. Chronic hyperinflation is stable from prior. Right lower lobe consolidation and tree in bud pattern in both lung bases on abdominal CT not well demonstrated radiographically. No acute airspace disease or pneumothorax. No acute osseous abnormalities are seen. IMPRESSION: Lung base opacities on CT are not well demonstrated radiographically. Chronic hyperinflation, stable from prior. No evidence of CHF. Electronically Signed   By: Keith Rake M.D.   On: 02/16/2018 02:15    Scheduled Meds: . aspirin  325 mg Oral Daily  . atorvastatin  20 mg Oral Daily  . DULoxetine  30 mg Oral Daily  . enoxaparin (LOVENOX) injection  40 mg Subcutaneous Q24H  . fentaNYL  1 patch Transdermal Q72H  . fentaNYL  1 patch Transdermal Q72H  . gabapentin  200 mg Oral TID  . megestrol  20 mg Oral Daily  .  mometasone-formoterol  2 puff Inhalation BID  . tiotropium  18 mcg Inhalation Daily   Continuous Infusions: . 0.9 % NaCl with KCl 20 mEq / L 50 mL/hr at 02/16/18 1156    Assessment/Plan:  1. Viral gastroenteritis with diarrhea.  Saprovirus growing out of the GI panel.  Supportive care.  Try to take out rectal tube today.  PRN Imodium. 2. Nonsustained V. tach 12 beats.  Replace magnesium and potassium.  Patient has a history of cardiomyopathy with defibrillator.  Defibrillator did not go off.  This is likely from underlying gastroenteritis and electrolyte issues.  3. Chronic respiratory failure on oxygen 4. COPD.  Continue inhalers. 5. Chronic systolic congestive heart failure.  Decrease rate of IV fluids. 6. Urinary retention.  In and out straight caths for inability to urinate. 7. Multiple myeloma. 8. Weakness.  Physical therapy evaluation 9. CT scan showing possible infiltrates at the base.  X-ray did not comment on this.  Send off a procalcitonin to decide whether to give antibiotics or not.  Code Status:     Code Status Orders  (From admission, onward)         Start     Ordered   02/15/18 1603  Full code  Continuous     02/15/18 1602        Code Status History    Date Active Date Inactive Code Status Order ID Comments User Context   05/26/2016 1100 05/28/2016 1813 DNR [id number]  Nicholes Mango, MD Inpatient   05/24/2016 1740 05/26/2016 1059 Full Code [id number]  Awilda Bill, NP Inpatient   05/23/2016 1915 05/24/2016 1740 DNR [id number]  Hillary Bow, MD ED   05/23/2016 1353 05/23/2016 1915 DNR [id number]  Eula Listen, MD ED   12/23/2014 1801 12/27/2014 1629 Full Code [id number]  Theodoro Grist, MD Inpatient    Advance Directive Documentation     Most Recent Value  Type of Advance Directive  Healthcare Power of [place], Living will  Pre-existing out of facility DNR order (yellow form or pink MOST form)  -  "MOST" Form in Place?  -      Disposition Plan: Evaluate  daily  Consultants:  None  Time spent: 28 minutes  [facility]

## 2018-02-16 NOTE — Progress Notes (Addendum)
Dr Leslye Peer notified via text that patient had 12 beat run of V Tach.

## 2018-02-17 LAB — CBC
HCT: 30.3 % — ABNORMAL LOW (ref 39.0–52.0)
HCT: 32 % — ABNORMAL LOW (ref 39.0–52.0)
Hemoglobin: 9.8 g/dL — ABNORMAL LOW (ref 13.0–17.0)
Hemoglobin: 10.4 g/dL — ABNORMAL LOW (ref 13.0–17.0)
MCH: 27.5 pg (ref 26.0–34.0)
MCH: 27.7 pg (ref 26.0–34.0)
MCHC: 32.3 g/dL (ref 30.0–36.0)
MCHC: 32.5 g/dL (ref 30.0–36.0)
MCV: 85.1 fL (ref 80.0–100.0)
MCV: 85.3 fL (ref 80.0–100.0)
NRBC: 0 % (ref 0.0–0.2)
PLATELETS: 147 10*3/uL — AB (ref 150–400)
Platelets: 142 10*3/uL — ABNORMAL LOW (ref 150–400)
RBC: 3.56 MIL/uL — AB (ref 4.22–5.81)
RBC: 3.75 MIL/uL — ABNORMAL LOW (ref 4.22–5.81)
RDW: 13.9 % (ref 11.5–15.5)
RDW: 14 % (ref 11.5–15.5)
WBC: 9.6 10*3/uL (ref 4.0–10.5)
WBC: 10.2 10*3/uL (ref 4.0–10.5)
nRBC: 0 % (ref 0.0–0.2)

## 2018-02-17 LAB — PROCALCITONIN: Procalcitonin: <0.1 ng/mL

## 2018-02-17 LAB — MAGNESIUM: Magnesium: 2 mg/dL (ref 1.7–2.4)

## 2018-02-17 LAB — BASIC METABOLIC PANEL
Anion gap: 5 (ref 5–15)
BUN: 7 mg/dL (ref 6–20)
CO2: 21 mmol/L — AB (ref 22–32)
Calcium: 7.9 mg/dL — ABNORMAL LOW (ref 8.9–10.3)
Chloride: 111 mmol/L (ref 98–111)
Creatinine, Ser: 0.66 mg/dL (ref 0.61–1.24)
GFR calc Af Amer: >60 mL/min (ref >60)
GFR calc non Af Amer: >60 mL/min (ref >60)
Glucose, Bld: 86 mg/dL (ref 70–99)
Potassium: 3.7 mmol/L (ref 3.5–5.1)
Sodium: 137 mmol/L (ref 135–145)

## 2018-02-17 LAB — FERRITIN: Ferritin: 269 ng/mL (ref 24–336)

## 2018-02-17 LAB — LACTATE DEHYDROGENASE: LDH: 172 U/L (ref 98–192)

## 2018-02-17 MED ORDER — POTASSIUM CHLORIDE CRYS ER 20 MEQ PO TBCR
40.0000 meq | EXTENDED_RELEASE_TABLET | Freq: Once | ORAL | Status: AC
  Administered 2018-02-17: 40 meq via ORAL
  Filled 2018-02-17: qty 2

## 2018-02-17 MED ORDER — METOPROLOL SUCCINATE ER 25 MG PO TB24
25.0000 mg | ORAL_TABLET | Freq: Every day | ORAL | Status: DC
  Administered 2018-02-17 – 2018-02-18 (×2): 25 mg via ORAL
  Filled 2018-02-17 (×2): qty 1

## 2018-02-17 MED ORDER — ENSURE ENLIVE PO LIQD
237.0000 mL | Freq: Three times a day (TID) | ORAL | Status: DC
  Administered 2018-02-17 – 2018-02-18 (×2): 237 mL via ORAL

## 2018-02-17 MED ORDER — ADULT MULTIVITAMIN W/MINERALS CH
1.0000 | ORAL_TABLET | Freq: Every day | ORAL | Status: DC
  Administered 2018-02-18: 10:00:00 1 via ORAL
  Filled 2018-02-17: qty 1

## 2018-02-17 NOTE — Evaluation (Signed)
Physical Therapy Evaluation Patient Details Name: [name] MRN: [id number] DOB: April 10, 1960 Today's Date: 02/17/2018   History of Present Illness  58 yo male with onset of gastroenteritis and elevated HR was admitted, noted vtach and urinary retention.  Has catheter now, noted viral GI contamination.  PMHx:  multiple myeloma, chronic respiratory failure, COPD, CHF, fat and mm depletion  Clinical Impression  Pt is up to walk with PT assisting and noted O2 sats were 97% after walk but his baseline pulse of 113 went up to 145. He recovered in 2 mins but is quite tired from the effort and concerned his GI issue would make him need to run to [place] urgently.  Will continue therapy to focus on mobility but continue to recommend SNF despite pt wishes.  He is going to need to demonstrate ability to climb 3 stairs before leaving as he has no railing to enter house.  Will try this with portable stair tomorrow if appropriate and practical given his GI urgency.      Follow Up Recommendations SNF    Equipment Recommendations  None recommended by PT    Recommendations for Other Services       Precautions / Restrictions Precautions Precautions: Fall(telemetry) Precaution Comments: v tach today Restrictions Weight Bearing Restrictions: No      Mobility  Bed Mobility Overal bed mobility: Needs Assistance Bed Mobility: Supine to Sit;Sit to Supine     Supine to sit: Min assist Sit to supine: Min assist   General bed mobility comments: min assist for trunk OOB and min assist for legs back to bed  Transfers Overall transfer level: Needs assistance Equipment used: Rolling walker (2 wheeled);1 person hand held assist Transfers: Sit to/from Stand Sit to Stand: Min assist            Ambulation/Gait Ambulation/Gait assistance: Min assist Gait Distance (Feet): 50 Feet Assistive device: Rolling walker (2 wheeled);1 person hand held assist Gait Pattern/deviations: Step-to  pattern;Step-through pattern;Decreased stride length;Wide base of support(PT assisting with O2 tank and maneuvering around furniture) Gait velocity: reduced Gait velocity interpretation: <1.31 ft/sec, indicative of household ambulator General Gait Details: unsteady, looks likely to lose his balance alone due to knees mildly buckling  Stairs            Wheelchair Mobility    Modified Rankin (Stroke Patients Only)       Balance Overall balance assessment: Needs assistance Sitting-balance support: Feet supported;Bilateral upper extremity supported Sitting balance-Leahy Scale: Fair     Standing balance support: Bilateral upper extremity supported;During functional activity Standing balance-Leahy Scale: Poor                               Pertinent Vitals/Pain Pain Assessment: No/denies pain    Home Living Family/patient expects to be discharged to:: Private residence Living Arrangements: Spouse/significant other Available Help at Discharge: Family;Available 24 hours/day Type of Home: House Home Access: Stairs to enter Entrance Stairs-Rails: None Entrance Stairs-Number of Steps: 3 Home Layout: One level Home Equipment: Environmental consultant - 2 wheels      Prior Function Level of Independence: Independent with assistive device(s)               Hand Dominance   Dominant Hand: Right    Extremity/Trunk Assessment   Upper Extremity Assessment Upper Extremity Assessment: Overall WFL for tasks assessed    Lower Extremity Assessment Lower Extremity Assessment: Overall WFL for tasks assessed(except for ankles NT due  to PN)    Cervical / Trunk Assessment Cervical / Trunk Assessment: Kyphotic  Communication   Communication: No difficulties  Cognition Arousal/Alertness: Awake/alert Behavior During Therapy: WFL for tasks assessed/performed Overall Cognitive Status: Within Functional Limits for tasks assessed                                         General Comments General comments (skin integrity, edema, etc.): pt is requiring RW for support, noted pulses were 113 baseline and 145 at end of walk.  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 with electrical tingling on feet at all times   Assessment/Plan    PT Assessment Patient needs continued PT services  PT Problem List Decreased strength;Decreased range of motion;Decreased activity tolerance;Decreased balance;Decreased mobility;Decreased coordination;Decreased safety awareness;Decreased knowledge of precautions;Cardiopulmonary status limiting activity;Pain       PT Treatment Interventions DME instruction;Gait training;Stair training;Functional mobility training;Therapeutic activities;Therapeutic exercise;Balance training;Neuromuscular re-education;Patient/family education    PT Goals (Current goals can be found in the Care Plan section)  Acute Rehab PT Goals Patient Stated Goal: to go directly home PT Goal Formulation: With patient Time For Goal Achievement: 03/03/18 Potential to Achieve Goals: Good    Frequency Min 2X/week   Barriers to discharge Inaccessible home environment home with 3 steps no rails to enter and elevated HR    Co-evaluation               AM-PAC PT "6 Clicks" Mobility  Outcome Measure Help needed turning from your back to your side while in a flat bed without using bedrails?: A Little Help needed moving from lying on your back to sitting on the side of a flat bed without using bedrails?: A Little Help needed moving to and from a bed to a chair (including a wheelchair)?: A Little Help needed standing up from a chair using your arms (e.g., wheelchair or bedside chair)?: A Little Help needed to walk in hospital room?: A Lot Help needed climbing 3-5 steps with a railing? : Total 6 Click Score: 15    End of Session Equipment Utilized During Treatment: Oxygen;Gait belt Activity  Tolerance: Patient limited by fatigue;Treatment limited secondary to medical complications (Comment) Patient left: in bed;with call bell/phone within reach;with bed alarm set Nurse Communication: Mobility status PT Visit Diagnosis: Unsteadiness on feet (R26.81);Muscle weakness (generalized) (M62.81);Difficulty in walking, not elsewhere classified (R26.2);Pain Pain - Right/Left: (both) Pain - part of body: Ankle and joints of foot    Time: 1515-1555 PT Time Calculation (min) (ACUTE ONLY): 40 min   Charges:   PT Evaluation $PT Eval Moderate Complexity: 1 Mod PT Treatments $Gait Training: 8-22 mins $Therapeutic Exercise: 8-22 mins       Ramond Dial 02/17/2018, 5:07 PM  Mee Hives, PT MS Acute Rehab Dept. Number: [place]

## 2018-02-17 NOTE — Progress Notes (Signed)
Patient ID: [name], male   DOB: Jun 07, 1960, 58 y.o.   MRN: [id number]  [facility] PROGRESS NOTE  [name] BMW:[id number] DOB: 1960-09-23 DOA: 02/15/2018 PCP: Petra Kuba, MD  HPI/Subjective: Patient's rectal tube has come out this morning.  Still with some nausea and lower abdominal pain.  Did not eat very much.  Patient had some urinary retention last night and needed to be straight cath.  Objective: Vitals:   02/17/18 0419 02/17/18 0856  BP: (!) 97/57 119/83  Pulse: 91 (!) 104  Resp: 18 18  Temp: 98.2 F (36.8 C) 97.7 F (36.5 C)  SpO2: 95% 94%    Filed Weights   02/15/18 1142  Weight: 59.4 kg    ROS: Review of Systems  Constitutional: Negative for chills and fever.  Eyes: Negative for blurred vision.  Respiratory: Negative for cough and shortness of breath.   Cardiovascular: Negative for chest pain.  Gastrointestinal: Positive for abdominal pain, diarrhea and nausea. Negative for constipation and vomiting.  Genitourinary: Negative for dysuria.  Musculoskeletal: Negative for joint pain.  Neurological: Negative for dizziness and headaches.   Exam: Physical Exam  Constitutional: He is oriented to person, place, and time.  HENT:  Nose: No mucosal edema.  Mouth/Throat: No oropharyngeal exudate or posterior oropharyngeal edema.  Eyes: Pupils are equal, round, and reactive to light. Conjunctivae, EOM and lids are normal.  Neck: No JVD present. Carotid bruit is not present. No edema present. No thyroid mass and no thyromegaly present.  Cardiovascular: S1 normal and S2 normal. Exam reveals no gallop.  No murmur heard. Pulses:      Dorsalis pedis pulses are 2+ on the right side and 2+ on the left side.  Respiratory: No respiratory distress. He has no wheezes. He has no rhonchi. He has no rales.  GI: Soft. Bowel sounds are normal. There is abdominal tenderness in the suprapubic area.  Musculoskeletal:     Right ankle: He exhibits no swelling.   Left ankle: He exhibits no swelling.  Lymphadenopathy:    He has no cervical adenopathy.  Neurological: He is alert and oriented to person, place, and time. No cranial nerve deficit.  Skin: Skin is warm. No rash noted. Nails show no clubbing.  Psychiatric: He has a normal mood and affect.      Data Reviewed: Basic Metabolic Panel: Recent Labs  Lab 02/15/18 1152 02/16/18 0145 02/17/18 0410  NA 138  --  137  K 3.0*  --  3.7  CL 100  --  111  CO2 27  --  21*  GLUCOSE 120*  --  86  BUN 11  --  7  CREATININE 1.08  --  0.66  CALCIUM 9.9  --  7.9*  MG  --  1.7 2.0   Liver Function Tests: Recent Labs  Lab 02/15/18 1152  AST 15  ALT 9  ALKPHOS 49  BILITOT 0.4  PROT 8.1  ALBUMIN 3.7   CBC: Recent Labs  Lab 02/15/18 1152 02/16/18 0145 02/17/18 0410  WBC 21.4* 19.1* 9.6  NEUTROABS 18.3*  --   --   HGB 14.0 12.6* 9.8*  HCT 42.7 37.5* 30.3*  MCV 84.1 82.8 85.1  PLT 274 232 142*   Cardiac Enzymes: Recent Labs  Lab 02/15/18 1152 02/16/18 0145  TROPONINI <0.03 <0.03     Recent Results (from the past 240 hour(s))  Gastrointestinal Panel by PCR , Stool     Status: Abnormal   Collection Time: 02/15/18 11:52 AM  Result Value Ref Range Status   Campylobacter species NOT DETECTED NOT DETECTED Final   Plesimonas shigelloides NOT DETECTED NOT DETECTED Final   Salmonella species NOT DETECTED NOT DETECTED Final   Yersinia enterocolitica NOT DETECTED NOT DETECTED Final   Vibrio species NOT DETECTED NOT DETECTED Final   Vibrio cholerae NOT DETECTED NOT DETECTED Final   Enteroaggregative E coli (EAEC) NOT DETECTED NOT DETECTED Final   Enteropathogenic E coli (EPEC) NOT DETECTED NOT DETECTED Final   Enterotoxigenic E coli (ETEC) NOT DETECTED NOT DETECTED Final   Shiga like toxin producing E coli (STEC) NOT DETECTED NOT DETECTED Final   Shigella/Enteroinvasive E coli (EIEC) NOT DETECTED NOT DETECTED Final   Cryptosporidium NOT DETECTED NOT DETECTED Final   Cyclospora  cayetanensis NOT DETECTED NOT DETECTED Final   Entamoeba histolytica NOT DETECTED NOT DETECTED Final   Giardia lamblia NOT DETECTED NOT DETECTED Final   Adenovirus F40/41 NOT DETECTED NOT DETECTED Final   Astrovirus NOT DETECTED NOT DETECTED Final   Norovirus GI/GII NOT DETECTED NOT DETECTED Final   Rotavirus A NOT DETECTED NOT DETECTED Final   Sapovirus (I, II, IV, and V) DETECTED (A) NOT DETECTED Final    Comment: Performed at [facility], [address] [place], [place] [address]  C difficile quick scan w PCR reflex     Status: None   Collection Time: 02/15/18 11:52 AM  Result Value Ref Range Status   C Diff antigen NEGATIVE NEGATIVE Final   C Diff toxin NEGATIVE NEGATIVE Final   C Diff interpretation No C. difficile detected.  Final    Comment: Performed at [facility], [address] [place], [place] [address]  Blood culture (routine x 2)     Status: None (Preliminary result)   Collection Time: 02/15/18  2:52 PM  Result Value Ref Range Status   Specimen Description BLOOD BLOOD RIGHT FOREARM  Final   Special Requests   Final    BOTTLES DRAWN AEROBIC AND ANAEROBIC Blood Culture results may not be optimal due to an excessive volume of blood received in culture bottles   Culture   Final    NO GROWTH 2 DAYS Performed at [facility], [address] [place], [place] [address]    Report Status PENDING  Incomplete  Blood culture (routine x 2)     Status: None (Preliminary result)   Collection Time: 02/15/18  2:54 PM  Result Value Ref Range Status   Specimen Description BLOOD BLOOD RIGHT WRIST  Final   Special Requests   Final    BOTTLES DRAWN AEROBIC AND ANAEROBIC Blood Culture adequate volume   Culture   Final    NO GROWTH 2 DAYS Performed at [facility], [address] [place], [place] [address]    Report Status PENDING  Incomplete     Studies: Dg Chest Port 1 View  Result Date: 02/16/2018 CLINICAL DATA:  CHF. EXAM: PORTABLE  CHEST 1 VIEW COMPARISON:  Radiographs 01/03/2018. Lung bases from abdominal CT earlier this day. FINDINGS: Single lead left-sided pacemaker in place. Unchanged heart size and mediastinal contours. No pulmonary edema or large pleural effusion. Chronic hyperinflation is stable from prior. Right lower lobe consolidation and tree in bud pattern in both lung bases on abdominal CT not well demonstrated radiographically. No acute airspace disease or pneumothorax. No acute osseous abnormalities are seen. IMPRESSION: Lung base opacities on CT are not well demonstrated radiographically. Chronic hyperinflation, stable from prior. No evidence of CHF. Electronically Signed   By:  Keith Rake M.D.   On: 02/16/2018 02:15    Scheduled Meds: . aspirin  325 mg Oral Daily  . atorvastatin  20 mg Oral Daily  . DULoxetine  30 mg Oral Daily  . enoxaparin (LOVENOX) injection  40 mg Subcutaneous Q24H  . feeding supplement (ENSURE ENLIVE)  237 mL Oral TID BM  . fentaNYL  1 patch Transdermal Q72H  . fentaNYL  1 patch Transdermal Q72H  . gabapentin  200 mg Oral TID  . megestrol  20 mg Oral Daily  . mometasone-formoterol  2 puff Inhalation BID  . [START ON 02/18/2018] multivitamin with minerals  1 tablet Oral Daily  . tiotropium  18 mcg Inhalation Daily   Continuous Infusions:   Assessment/Plan:  1. Viral gastroenteritis with diarrhea.  Saprovirus growing out of the GI panel.  Supportive care.  Rectal tube taken out today.  PRN Imodium. 2. Nonsustained V. tach 12 beats yesterday.  Replace magnesium and potassium.  Patient has a history of cardiomyopathy with defibrillator.  Defibrillator did not go off.  This is likely from underlying gastroenteritis and electrolyte issues. 3. Tachycardia with limited movement.  Need to see how he does with physical therapy prior to disposition. 4. Chronic respiratory failure on oxygen 5. COPD.  Continue inhalers. 6. Chronic systolic congestive heart failure.  Discontinue IV  fluids. 7. Urinary retention.  In and out straight caths for inability to urinate. 8. Multiple myeloma. 9. Weakness.  Physical therapy evaluation 10. CT scan showing possible infiltrates at the base.  X-ray did not comment on this.  Procalcitonin negative.  Hold off on antibiotics. 11. Drop in hemoglobin and platelet count on last CBC.  Repeat a CBC now and again tomorrow morning.  The patient's first hemoglobin likely hemoconcentrated.  Guaiac stools.  Add on a ferritin.  Check LDH and haptoglobin in the morning.  Code Status:     Code Status Orders  (From admission, onward)         Start     Ordered   02/15/18 1603  Full code  Continuous     02/15/18 1602        Code Status History    Date Active Date Inactive Code Status Order ID Comments User Context   05/26/2016 1100 05/28/2016 1813 DNR [id number]  Nicholes Mango, MD Inpatient   05/24/2016 1740 05/26/2016 1059 Full Code [id number]  Awilda Bill, NP Inpatient   05/23/2016 1915 05/24/2016 1740 DNR [id number]  Hillary Bow, MD ED   05/23/2016 1353 05/23/2016 1915 DNR [id number]  Eula Listen, MD ED   12/23/2014 1801 12/27/2014 1629 Full Code [id number]  Theodoro Grist, MD Inpatient    Advance Directive Documentation     Most Recent Value  Type of Advance Directive  Healthcare Power of [place], Living will  Pre-existing out of facility DNR order (yellow form or pink MOST form)  -  "MOST" Form in Place?  -      Disposition Plan: Evaluate daily  Consultants:  None  Time spent: 32 minutes  [place]

## 2018-02-17 NOTE — Plan of Care (Addendum)
Denies abdominal pain. Slight lower abdominal tenderness per pt.  Fair appetite. Denies N/V. No stool during the shift. Intermittent tachycardia, HR up to 130's at rest.  Dr Leslye Peer made aware, Toprol-XL initiated.  Pt voided.

## 2018-02-17 NOTE — Progress Notes (Signed)
Initial Nutrition Assessment  DOCUMENTATION CODES:   Severe malnutrition in context of chronic illness, Underweight  INTERVENTION:  Provide Ensure Enlive po TID, each supplement provides 350 kcal and 20 grams of protein. Patient prefers strawberry.  Provide daily MVI.  Current dose of Megace is not high enough to promote an increase in appetite. Consider increasing to 300 mg BID.  Encouraged adequate intake of calories and protein at meals and snacks. Discussed increasing to drinking a high-calorie, high-protein ONS TID. Patient cannot afford these. Discussed a homemade recipe and wrote on board for patient's fiance.  NUTRITION DIAGNOSIS:   Severe Malnutrition related to chronic illness(COPD, CHF, multiple myeloma) as evidenced by severe fat depletion, severe muscle depletion.  GOAL:   Patient will meet greater than or equal to 90% of their needs  MONITOR:   PO intake, Supplement acceptance, Labs, Weight trends, I & O's, Skin  REASON FOR ASSESSMENT:   Malnutrition Screening Tool    ASSESSMENT:   58 year old male with PMHx of anxiety, CHF, COPD, GERD, hx substance abuse, CAD, hx MI, HLD, arthritis, multiple myeloma on Ninlaro-Dex who is now admitted with viral gastroenteritis with diarrhea.   Met with patient at bedside. He reports he has had a decreased appetite and intake for the past 5 years as his health has been declining. He only eats 2 meals per day now and they are smaller meals. In the morning he drinks one bottle of Boost original (reports he cannot afford the Plus). He may have a small sandwich at lunch. Dinner is usually chicken with vegetables. He is amenable to trying strawberry Ensure Enlive while here. He reports his appetite is starting to increase some.  UBW was 180-185 lbs. He has slowly lost weight over time to around 130 lbs.  Medications reviewed and include: fentanyl patch, gabapentin, Megace 20 mg daily.  Labs reviewed: CO2 21.  NUTRITION - FOCUSED  PHYSICAL EXAM:    Most Recent Value  Orbital Region  Severe depletion  Upper Arm Region  Severe depletion  Thoracic and Lumbar Region  Severe depletion  Buccal Region  Severe depletion  Temple Region  Severe depletion  Clavicle Bone Region  Severe depletion  Clavicle and Acromion Bone Region  Severe depletion  Scapular Bone Region  Severe depletion  Dorsal Hand  Severe depletion  Patellar Region  Severe depletion  Anterior Thigh Region  Severe depletion  Posterior Calf Region  Severe depletion  Edema (RD Assessment)  None  Hair  Reviewed  Eyes  Reviewed  Mouth  Reviewed  Skin  Reviewed  Nails  Reviewed     Diet Order:   Diet Order            Diet regular Room service appropriate? Yes; Fluid consistency: Thin  Diet effective now             EDUCATION NEEDS:   Education needs have been addressed  Skin:  Skin Assessment: Skin Integrity Issues:(MSAD to groin and perineum; ecchymosis)  Last BM:  02/17/2018 - medium type 7  Height:   Ht Readings from Last 1 Encounters:  02/15/18 6' (1.829 m)   Weight:   Wt Readings from Last 1 Encounters:  02/15/18 59.4 kg   Ideal Body Weight:  80.9 kg  BMI:  Body mass index is 17.77 kg/m.  Estimated Nutritional Needs:   Kcal:  1885-2175 (MSJ x 1.3-1.5)  Protein:  90-100 grams (1.5-1.7 grams/kg)  Fluid:  1.9-2.2 L/day (1 mL/kcal)  Willey Blade, MS, RD, LDN Office:  [phone] Pager: [phone] After Hours/Weekend Pager: [phone]

## 2018-02-18 ENCOUNTER — Other Ambulatory Visit: Payer: Self-pay | Admitting: *Deleted

## 2018-02-18 ENCOUNTER — Telehealth: Payer: Self-pay | Admitting: Nurse Practitioner

## 2018-02-18 DIAGNOSIS — C9000 Multiple myeloma not having achieved remission: Secondary | ICD-10-CM

## 2018-02-18 LAB — CBC
HCT: 31 % — ABNORMAL LOW (ref 39.0–52.0)
Hemoglobin: 10 g/dL — ABNORMAL LOW (ref 13.0–17.0)
MCH: 27.5 pg (ref 26.0–34.0)
MCHC: 32.3 g/dL (ref 30.0–36.0)
MCV: 85.4 fL (ref 80.0–100.0)
Platelets: 122 10*3/uL — ABNORMAL LOW (ref 150–400)
RBC: 3.63 MIL/uL — ABNORMAL LOW (ref 4.22–5.81)
RDW: 14.1 % (ref 11.5–15.5)
WBC: 9.6 10*3/uL (ref 4.0–10.5)
nRBC: 0 % (ref 0.0–0.2)

## 2018-02-18 LAB — PROCALCITONIN: Procalcitonin: <0.1 ng/mL

## 2018-02-18 MED ORDER — HYDROMORPHONE HCL 2 MG PO TABS: 2.0000 mg | ORAL_TABLET | Freq: Four times a day (QID) | ORAL | 0 refills | Status: DC | PRN

## 2018-02-18 MED ORDER — TAMSULOSIN HCL 0.4 MG PO CAPS: 0.4000 mg | ORAL_CAPSULE | Freq: Every day | ORAL | Status: DC

## 2018-02-18 MED ORDER — FENTANYL 100 MCG/HR TD PT72: 1.0000 | MEDICATED_PATCH | TRANSDERMAL | 0 refills | Status: DC

## 2018-02-18 MED ORDER — TAMSULOSIN HCL 0.4 MG PO CAPS: 0.4000 mg | ORAL_CAPSULE | Freq: Every day | ORAL | 0 refills | Status: DC

## 2018-02-18 MED ORDER — FENTANYL 25 MCG/HR TD PT72: 1.0000 | MEDICATED_PATCH | TRANSDERMAL | 0 refills | Status: DC

## 2018-02-18 MED FILL — DEXAMETHASONE 4 MG TABLET: 4 | 28 days supply | Qty: 12 | Fill #1

## 2018-02-18 MED FILL — NINLARO 4 MG CAP: 4 | 28 days supply | Qty: 3 | Fill #1

## 2018-02-18 NOTE — Discharge Summary (Addendum)
[place] at [facility], 58 y.o., DOB Jul 08, 1960, MRN [id number]. Admission date: 02/15/2018 Discharge Date 02/18/2018 Primary MD Petra Kuba, MD Admitting Physician Fritzi Mandes, MD  Admission Diagnosis  Gastroenteritis [K52.9] Weakness [R53.1]  Discharge Diagnosis   Active Problems: Viral gastroenteritis with diarrhea Nonsustained V. tach Chronic respiratory failure COPD Chronic systolic CHF Urinary retention Multiple [place]  is a 58 y.o. male with a known history of people myeloma on chemo, COPD on chronic home oxygen, Gerd, hyperlipidemia, coronary artery disease comes to the emergency room with increasing weakness nausea and diarrhea for two days.  Patient was noted to be very dehydrated in the ED.  Also stool showed Supple virus.  Patient was treated with supportive care.  His symptoms improved.  Patient also had some nonsustained V. tach due to electrolyte imbalances.  He is continued on metoprolol.  Patient currently doing well and is back to baseline.  He was recommended to go to rehab however he states that he wants to go home.           Consults  None  Significant Tests:  See full reports for all details     Ct Abdomen Pelvis W Contrast  Result Date: 02/15/2018 CLINICAL DATA:  Elevated white blood cell count EXAM: CT ABDOMEN AND PELVIS WITH CONTRAST TECHNIQUE: Multidetector CT imaging of the abdomen and pelvis was performed using the standard protocol following bolus administration of intravenous contrast. CONTRAST:  158m OMNIPAQUE IOHEXOL 300 MG/ML  SOLN COMPARISON:  11/05/2017 and 05/23/2016 FINDINGS: Lower chest: There is consolidation at the posterior right lung base centrilobular nodules with a tree-in-bud pattern are present at both posterior lung bases. Hepatobiliary: Tiny hypodensities in the left lobe of the liver are stable. Gallbladder is decompressed. Pancreas: Unremarkable  Spleen: Unremarkable Adrenals/Urinary Tract: Adrenal glands are within normal limits. Tiny hypodensity in the lower pole of both kidneys are not significantly changed. Bladder is within normal limits. Stomach/Bowel: There is narrowing of the duodenum as it passes posterior to the SMA. There is also dilatation of the proximal duodenum. Fluid-filled loops of small and large bowel are present in the abdomen without disproportionate dilatation. The transverse and descending colon are relatively decompressed with avid enhancement of the mucosa. No pneumatosis. No stranding in the adjacent fat. Several fluid-filled small bowel loops in the mid and lower abdomen exhibit prominence of the folds. Again, there is no pneumatosis or stranding in the adjacent fat. The appendix is also borderline prominent measuring 9 mm in caliber. There is no stranding in the adjacent fat. Vascular/Lymphatic: Atherosclerotic vascular calcifications of the aorta and iliac arteries. There is no abnormal retroperitoneal adenopathy. Reproductive: Normal prostate. Other: No free-fluid.  No free intraperitoneal gas. Musculoskeletal: Compression fractures of T12, L1, and L2 are stable. A lytic lesion in the central sacrum on image 45 is stable. Multiple suspected lytic lesions throughout the visualized spine are not significantly changed. IMPRESSION: There is consolidation at the posterior right lung base. Tree-in-bud centrilobular nodules are also present at the lung bases. These findings are worrisome for an inflammatory process and could account for the leukocytosis. Small and large bowel loops have an abnormal appearance in the abdomen. Transverse and descending colon mucosa is avidly enhancing. Lower abdominal small bowel folds are prominent. The appendix is also borderline prominent. These findings suggest a diffuse process such as enteritis or possibly volume overload. A focal inflammatory process of bowel is not excluded.  Dilatation of the  duodenum with narrowing has a crosses posterior to the SMA. Nutcracker syndrome is not excluded. Multiple lytic lesions are not significantly changed. Thoracolumbar compression fractures are also stable. Aortic Atherosclerosis (ICD10-I70.0). Electronically Signed   By: Marybelle Killings M.D.   On: 02/15/2018 13:26   Dg Chest Port 1 View  Result Date: 02/16/2018 CLINICAL DATA:  CHF. EXAM: PORTABLE CHEST 1 VIEW COMPARISON:  Radiographs 01/03/2018. Lung bases from abdominal CT earlier this day. FINDINGS: Single lead left-sided pacemaker in place. Unchanged heart size and mediastinal contours. No pulmonary edema or large pleural effusion. Chronic hyperinflation is stable from prior. Right lower lobe consolidation and tree in bud pattern in both lung bases on abdominal CT not well demonstrated radiographically. No acute airspace disease or pneumothorax. No acute osseous abnormalities are seen. IMPRESSION: Lung base opacities on CT are not well demonstrated radiographically. Chronic hyperinflation, stable from prior. No evidence of CHF. Electronically Signed   By: Keith Rake M.D.   On: 02/16/2018 02:15       Today   Subjective:   Kenyatta Chuong patient denying any complaints doing well  Objective:   Blood pressure 121/84, pulse 100, temperature 98.4 F (36.9 C), temperature source Oral, resp. rate 18, height 6' (1.829 m), weight 59.4 kg, SpO2 94 %.  .  Intake/Output Summary (Last 24 hours) at 02/18/2018 1433 Last data filed at 02/18/2018 1309 Gross per 24 hour  Intake -  Output 2925 ml  Net -2925 ml    Exam VITAL SIGNS: Blood pressure 121/84, pulse 100, temperature 98.4 F (36.9 C), temperature source Oral, resp. rate 18, height 6' (1.829 m), weight 59.4 kg, SpO2 94 %.  GENERAL:  58 y.o.-year-old patient lying in the bed with no acute distress.  EYES: Pupils equal, round, reactive to light and accommodation. No scleral icterus. Extraocular muscles intact.  HEENT: Head atraumatic,  normocephalic. Oropharynx and nasopharynx clear.  NECK:  Supple, no jugular venous distention. No thyroid enlargement, no tenderness.  LUNGS: Normal breath sounds bilaterally, no wheezing, rales,rhonchi or crepitation. No use of accessory muscles of respiration.  CARDIOVASCULAR: S1, S2 normal. No murmurs, rubs, or gallops.  ABDOMEN: Soft, nontender, nondistended. Bowel sounds present. No organomegaly or mass.  EXTREMITIES: No pedal edema, cyanosis, or clubbing.  NEUROLOGIC: Cranial nerves II through XII are intact. Muscle strength 5/5 in all extremities. Sensation intact. Gait not checked.  PSYCHIATRIC: The patient is alert and oriented x 3.  SKIN: No obvious rash, lesion, or ulcer.   Data Review     CBC w Diff:  Lab Results  Component Value Date   WBC 9.6 02/18/2018   HGB 10.0 (L) 02/18/2018   HGB 10.6 (L) 05/12/2014   HCT 31.0 (L) 02/18/2018   HCT 30.9 (L) 05/12/2014   PLT 122 (L) 02/18/2018   PLT 258 05/12/2014   LYMPHOPCT 8 02/15/2018   LYMPHOPCT 20.8 05/12/2014   MONOPCT 4 02/15/2018   MONOPCT 12.7 05/12/2014   EOSPCT 0 02/15/2018   EOSPCT 3.0 05/12/2014   BASOPCT 0 02/15/2018   BASOPCT 1.0 05/12/2014   CMP:  Lab Results  Component Value Date   NA 137 02/17/2018   NA 135 05/12/2014   K 3.7 02/17/2018   K 3.3 (L) 05/12/2014   CL 111 02/17/2018   CL 101 05/12/2014   CO2 21 (L) 02/17/2018   CO2 27 05/12/2014   BUN 7 02/17/2018   BUN 9 05/12/2014   CREATININE 0.66 02/17/2018   CREATININE 0.89 05/12/2014   PROT 8.1  02/15/2018   PROT 5.9 (L) 02/17/2014   ALBUMIN 3.7 02/15/2018   ALBUMIN 2.8 (L) 02/17/2014   BILITOT 0.4 02/15/2018   BILITOT 0.2 02/17/2014   ALKPHOS 49 02/15/2018   ALKPHOS 44 (L) 02/17/2014   AST 15 02/15/2018   AST 9 (L) 02/17/2014   ALT 9 02/15/2018   ALT 12 (L) 02/17/2014  .  Micro Results Recent Results (from the past 240 hour(s))  Gastrointestinal Panel by PCR , Stool     Status: Abnormal   Collection Time: 02/15/18 11:52 AM  Result  Value Ref Range Status   Campylobacter species NOT DETECTED NOT DETECTED Final   Plesimonas shigelloides NOT DETECTED NOT DETECTED Final   Salmonella species NOT DETECTED NOT DETECTED Final   Yersinia enterocolitica NOT DETECTED NOT DETECTED Final   Vibrio species NOT DETECTED NOT DETECTED Final   Vibrio cholerae NOT DETECTED NOT DETECTED Final   Enteroaggregative E coli (EAEC) NOT DETECTED NOT DETECTED Final   Enteropathogenic E coli (EPEC) NOT DETECTED NOT DETECTED Final   Enterotoxigenic E coli (ETEC) NOT DETECTED NOT DETECTED Final   Shiga like toxin producing E coli (STEC) NOT DETECTED NOT DETECTED Final   Shigella/Enteroinvasive E coli (EIEC) NOT DETECTED NOT DETECTED Final   Cryptosporidium NOT DETECTED NOT DETECTED Final   Cyclospora cayetanensis NOT DETECTED NOT DETECTED Final   Entamoeba histolytica NOT DETECTED NOT DETECTED Final   Giardia lamblia NOT DETECTED NOT DETECTED Final   Adenovirus F40/41 NOT DETECTED NOT DETECTED Final   Astrovirus NOT DETECTED NOT DETECTED Final   Norovirus GI/GII NOT DETECTED NOT DETECTED Final   Rotavirus A NOT DETECTED NOT DETECTED Final   Sapovirus (I, II, IV, and V) DETECTED (A) NOT DETECTED Final    Comment: Performed at [facility], [address] [place], [place] [address]  C difficile quick scan w PCR reflex     Status: None   Collection Time: 02/15/18 11:52 AM  Result Value Ref Range Status   C Diff antigen NEGATIVE NEGATIVE Final   C Diff toxin NEGATIVE NEGATIVE Final   C Diff interpretation No C. difficile detected.  Final    Comment: Performed at [facility], [address] [place], [place] [address]  Blood culture (routine x 2)     Status: None (Preliminary result)   Collection Time: 02/15/18  2:52 PM  Result Value Ref Range Status   Specimen Description BLOOD BLOOD RIGHT FOREARM  Final   Special Requests   Final    BOTTLES DRAWN AEROBIC AND ANAEROBIC Blood Culture results may not be optimal due to an  excessive volume of blood received in culture bottles   Culture   Final    NO GROWTH 3 DAYS Performed at [facility], [address] [name], [place] [address]    Report Status PENDING  Incomplete  Blood culture (routine x 2)     Status: None (Preliminary result)   Collection Time: 02/15/18  2:54 PM  Result Value Ref Range Status   Specimen Description BLOOD BLOOD RIGHT WRIST  Final   Special Requests   Final    BOTTLES DRAWN AEROBIC AND ANAEROBIC Blood Culture adequate volume   Culture   Final    NO GROWTH 3 DAYS Performed at [facility], [address], [place], [place] [address]    Report Status PENDING  Incomplete        Code Status Orders  (From admission, onward)         Start  Ordered   02/15/18 1603  Full code  Continuous     02/15/18 1602        Code Status History    Date Active Date Inactive Code Status Order ID Comments User Context   05/26/2016 1100 05/28/2016 1813 DNR [id number]  Nicholes Mango, MD Inpatient   05/24/2016 1740 05/26/2016 1059 Full Code [id number]  Awilda Bill, NP Inpatient   05/23/2016 1915 05/24/2016 1740 DNR [id number]  Hillary Bow, MD ED   05/23/2016 1353 05/23/2016 1915 DNR [id number]  Eula Listen, MD ED   12/23/2014 1801 12/27/2014 1629 Full Code [id number]  Theodoro Grist, MD Inpatient    Advance Directive Documentation     Most Recent Value  Type of Advance Directive  Healthcare Power of Attorney, Living will  Pre-existing out of facility DNR order (yellow form or pink MOST form)  -  "MOST" Form in Place?  -          Follow-up Information    Petra Kuba, MD. Go on 02/21/2018.   Specialty:  Family Medicine Why:  at 10:00 a.m.  Contact information: [place] [address] [phone]           Discharge Medications   Allergies as of 02/18/2018   No Known Allergies     Medication List    STOP taking these medications   furosemide 20 MG tablet Commonly known as:   LASIX   polyethylene glycol packet Commonly known as:  MIRALAX / GLYCOLAX     TAKE these medications   acetaminophen 325 MG tablet Commonly known as:  TYLENOL Take 2 tablets (650 mg total) by mouth every 6 (six) hours as needed for mild pain (or Fever >/= 101).   albuterol 108 (90 Base) MCG/ACT inhaler Commonly known as:  PROVENTIL HFA;VENTOLIN HFA Inhale 2 puffs into the lungs every 6 (six) hours as needed for wheezing or shortness of breath.   ALPRAZolam 0.5 MG tablet Commonly known as:  XANAX Take 1 tablet (0.5 mg total) by mouth 3 (three) times daily as needed for anxiety.   aspirin 325 MG EC tablet Take 325 mg by mouth daily.   atorvastatin 20 MG tablet Commonly known as:  LIPITOR Take 20 mg by mouth daily.   dexamethasone 4 MG tablet Commonly known as:  DECADRON TAKE 3 TABLETS (12 MG TOTAL) BY MOUTH ONCE A WEEK. TAKE WITH BREAKFAST   DULoxetine 30 MG capsule Commonly known as:  CYMBALTA TAKE ONE CAPSULE BY MOUTH EVERY DAY   fentaNYL 25 MCG/HR Commonly known as:  DURAGESIC Place 1 patch (25 mcg total) onto the skin every other day. Use with a 100 mcg patch for total dose of 125 mcg   fentaNYL 100 MCG/HR Commonly known as:  DURAGESIC Place 1 patch (100 mcg total) onto the skin every other day. Use with a 25 mcg patch for total dose of 125 mcg   Fluticasone-Salmeterol 250-50 MCG/DOSE Aepb Commonly known as:  ADVAIR DISKUS Inhale 1 puff into the lungs 2 (two) times daily.   gabapentin 100 MG capsule Commonly known as:  NEURONTIN Take 2 capsules (200 mg total) by mouth 3 (three) times daily.   guaiFENesin-codeine 100-10 MG/5ML syrup Take 5 mLs by mouth every 4 (four) hours as needed for cough.   HYDROmorphone 2 MG tablet Commonly known as:  DILAUDID Take 1 tablet (2 mg total) by mouth every 4 (four) hours as needed for severe pain.   ipratropium-albuterol 0.5-2.5 (3) MG/3ML Soln Commonly known as:  DUONEB Take 3 mLs by nebulization every 4 (four) hours as  needed.   megestrol 20 MG tablet Commonly known as:  MEGACE TAKE ONE TABLET BY MOUTH ONCE DAILY   metoprolol tartrate 50 MG tablet Commonly known as:  LOPRESSOR Take 50 mg by mouth 2 (two) times daily.   NINLARO 4 MG capsule Generic drug:  ixazomib citrate TAKE 1 CAPSULE (4 MG TOTAL) BY MOUTH ONCE A WEEK. TAKE FOR 3 WEEKS ON, THEN 1 WEEK OFF. TAKE ON EMPTY STOMACH 1HR BEFORE OR 2HRS AFTER FOOD.   ondansetron 8 MG tablet Commonly known as:  ZOFRAN 1 pill every 8 hours as needed.   OXYGEN Inhale 3 L into the lungs daily.   tamsulosin 0.4 MG Caps capsule Commonly known as:  FLOMAX Take 1 capsule (0.4 mg total) by mouth daily.   tiotropium 18 MCG inhalation capsule Commonly known as:  SPIRIVA HANDIHALER Place 1 capsule (18 mcg total) into inhaler and inhale daily.            Durable Medical Equipment  (From admission, onward)         Start     Ordered   02/18/18 1114  DME Oxygen  Once    Comments:  On chronic o2  Question Answer Comment  Mode or (Route) Nasal cannula   Liters per Minute 3   Oxygen delivery system Gas      02/18/18 1113             Total Time in preparing paper work, data evaluation and todays exam - 11 minutes  Dustin Flock M.D on 02/18/2018 at 2:33 PM [place]  [phone]

## 2018-02-18 NOTE — Telephone Encounter (Signed)
[name] called reporting that patient is being discharged today asking to refill his Dilaudid and Fentanyl Hospital applied Fentanyl patch on 1/ 24 and again today 1/27 so he should have some left at home. Please advise

## 2018-02-18 NOTE — Telephone Encounter (Addendum)
Patient called me and states that he destroyed his last 2 patches when he came to hospital so that his daughter in law could not get a hold of them , He states she is back living with him again, but he is making her leave He reports also that with his increased pain, he was taking more Dilaudid than usual. He is in need of refill of his narcotics

## 2018-02-18 NOTE — Progress Notes (Signed)
Received MD order to discharge patient to home, reviewed home meds, discharge instructions, prescriptions and follow up appointments with patient and patient verbalized understanding    

## 2018-02-18 NOTE — Telephone Encounter (Signed)
Call from [facility] requesting refill of narcotics Fentanyl and Dilaudid. When checking chart I see that patient has been in hospital since Friday and I called 1C and spoke with Gerald Stabs, RN who stated patient has not been discharged and doctor has not rounded this morning. I called Megan back and told her we will not refill this as he is in the hospital and she stated he is going home today and she was trying to be proactive and get his medications on the way home. I explained that once we know he is discharged, we will see about refilling his medicine.and that he should have at least 2 or more days of medicine on hand since he has been in hospital since Friday. I asked Gerald Stabs to have patient call me when he is discharged

## 2018-02-18 NOTE — Telephone Encounter (Signed)
Received refill request for fentanyl patches and hydromorphone tablets. Per nursing refill request earlier today called in by his Megan, significant other.   Nursing advised that patient has been hospitalized since 02/15/2018, diagnosed with viral gastroenteritis with diarrhea, GI panel consistent with Saprovirus. Review of [place] reveals patient information as below.    Given hospitalization, where hospital provided pain medication, I expressed concern for previously prescribed medication and need for refill. Nursing followed up with patient who stated that his family member who is currently living with them 'will sometimes party with his medicines and he didn't want that to happen. Didn't want no babies get in to his medicines so he cut up his fentanyl patches to avoid this'. Based on this, I called the patient to clarify. He reiterated above information. I questioned what he had done with the dilaudid tablets. He became quite angry, yelling at me, cursing, stating 'what did I want him to do with those medicines, let some kid get in them. The bottle says I can take as needed and I'm taking them.' I advised him that destroying his medications due to hospitalization is not recommended, nor can we replace lost/stolen/damaged medications. He stated that his significant other has purchased a lock box to keep his medications secure since he has been hospitalized and that he plans to keep his medications safe from now on. I advised him that if he was taking more medication than was prescribed then that should have resulted in a phone call to the clinic so that he could have been evaluated in clinic. Review of Dr. Aletha Halim last note had stated pain had been stable on current regimen.  I reviewed his MAR while he has been hospitalized and saw that fentanyl patches were applied today, 02/18/2018 and that based on current prescribing, he would not need refill until 02/20/2018. I said that I would need to speak with  Dr. Rogue Bussing before I would be willing to write for any more dilaudid. Patient became argumentative, again yelling, stating that I 'must be made of money on that doctor's salary and can just run up and down the road, burning gas, whenever I want to'. He explains he can't go to the drug store every day and he needs medication today. Advised that this is why I don't advise patients to take more medication than prescribed, or to destroy their medications. Patient yelling that his cancer is eating his bones up and begins explaining MM to me. I questioned patient as to when he developed worsening pain and when he began needing increased doses of pain medications as per chart review he currently should have had approximately 18 dilaudid tablets on hand (hospitalized for 4 days). He says that he's 'going to come up there and you'll see'. I asked patient if he was threatening me. He said no but continued that he'd need to see his doctor to explain himself. I offered to make him an appointment. He again said that he could come up there whenever he wanted to. Throughout conversation patient yelling that I'm calling him a drug addict and that he's not addicted to his medications- this was never said or implied.   I discussed this with Dr. Rogue Bussing who advised having patient rtc this week for follow-up. I will send prescription for fentanyl patches for start on 02/21/18. Will send dilaudid tablets for 3 days as requested by Dr. Rogue Bussing.    Review of inpatient MAR reviewed. Per review, patient's pain controlled with Fentanyl 125 mcg q72 hours-  currently prescribed q48h. New patches were applied on 02/15/2018 and again on 02/18/2018. Patient did not receive dilaudid on 1/24 (instead received tramadol), once on 1/25, 3 times on 1/26, and 2 times on 1/27 which is significantly less than q4h prescribed. He received xanax BID during inpatient vs 3 times a day as prescribed.

## 2018-02-18 NOTE — Care Management Note (Signed)
Case Management Note  Patient Details  Name: [name] MRN: [id number] Date of Birth: 1960-02-03  Subjective/Objective:   Admitted to [facility] with the diagnosis of gastroenteritis. Lives with finance. Sees Dr. Shade Flood as primary care physician. Prescriptions are fill at [facility].  No home health in the past. No skilled nursing. [facility]. States he has a lot of equipment at home.  No falls. Fair appetite.  Discharge to home today per Dr. Posey Pronto                 Action/Plan: Physical therapy recommending skilled nursing facility. Declines these services at this time.  Discussed Home Health services. Declines these services at this time. "I think when I get home I will be alright."   Expected Discharge Date:  02/18/18               Expected Discharge Plan:     In-House Referral:   yes  Discharge planning Services   yes  Post Acute Care Choice:    Choice offered to:     DME Arranged:    DME Agency:     HH Arranged:    HH Agency:     Status of Service:     If discussed at [employer], dates discussed:    Additional Comments:  Shelbie Ammons, RN MSN CCM Care Management [phone] 02/18/2018, 2:59 PM

## 2018-02-19 LAB — HAPTOGLOBIN: Haptoglobin: 374 mg/dL — ABNORMAL HIGH (ref 29–370)

## 2018-02-20 ENCOUNTER — Other Ambulatory Visit: Payer: Self-pay | Admitting: Pharmacist

## 2018-02-20 DIAGNOSIS — M25552 Pain in left hip: Secondary | ICD-10-CM

## 2018-02-20 LAB — CULTURE, BLOOD (ROUTINE X 2)
Culture: NO GROWTH
Culture: NO GROWTH
Special Requests: ADEQUATE

## 2018-02-20 MED ORDER — NALOXONE HCL 4 MG/0.1ML NA LIQD: NASAL | 2 refills | Status: AC

## 2018-02-20 MED FILL — NARCAN 4 MG NASAL SPRAY: 4 | 10 days supply | Qty: 2 | Fill #0

## 2018-02-21 ENCOUNTER — Encounter: Payer: Self-pay | Admitting: Internal Medicine

## 2018-02-21 ENCOUNTER — Inpatient Hospital Stay (HOSPITAL_BASED_OUTPATIENT_CLINIC_OR_DEPARTMENT_OTHER): Payer: Medicaid Other | Admitting: Internal Medicine

## 2018-02-21 VITALS — BP 95/64 | HR 86 | Temp 97.1°F | Resp 16 | Wt 134.6 lb

## 2018-02-21 DIAGNOSIS — Z7982 Long term (current) use of aspirin: Secondary | ICD-10-CM | POA: Diagnosis not present

## 2018-02-21 DIAGNOSIS — F419 Anxiety disorder, unspecified: Secondary | ICD-10-CM

## 2018-02-21 DIAGNOSIS — C9002 Multiple myeloma in relapse: Secondary | ICD-10-CM | POA: Diagnosis not present

## 2018-02-21 DIAGNOSIS — F17210 Nicotine dependence, cigarettes, uncomplicated: Secondary | ICD-10-CM | POA: Diagnosis not present

## 2018-02-21 DIAGNOSIS — Z79899 Other long term (current) drug therapy: Secondary | ICD-10-CM | POA: Diagnosis not present

## 2018-02-21 DIAGNOSIS — J449 Chronic obstructive pulmonary disease, unspecified: Secondary | ICD-10-CM | POA: Diagnosis not present

## 2018-02-21 DIAGNOSIS — R110 Nausea: Secondary | ICD-10-CM

## 2018-02-21 DIAGNOSIS — G8929 Other chronic pain: Secondary | ICD-10-CM

## 2018-02-21 DIAGNOSIS — C9000 Multiple myeloma not having achieved remission: Secondary | ICD-10-CM

## 2018-02-21 DIAGNOSIS — Z95810 Presence of automatic (implantable) cardiac defibrillator: Secondary | ICD-10-CM

## 2018-02-21 DIAGNOSIS — M25552 Pain in left hip: Secondary | ICD-10-CM

## 2018-02-21 MED ORDER — FENTANYL 100 MCG/HR TD PT72: 1.0000 | MEDICATED_PATCH | TRANSDERMAL | 0 refills | Status: DC

## 2018-02-21 MED ORDER — FENTANYL 25 MCG/HR TD PT72: 1.0000 | MEDICATED_PATCH | TRANSDERMAL | 0 refills | Status: DC

## 2018-02-21 MED ORDER — HYDROMORPHONE HCL 2 MG PO TABS: 2.0000 mg | ORAL_TABLET | Freq: Four times a day (QID) | ORAL | 0 refills | Status: DC | PRN

## 2018-02-21 NOTE — Assessment & Plan Note (Addendum)
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 AUG 2019-  Bone survey shows progressive lesions in the bilateral humeri/femur. Oct 2019- PET- no lytic lesions noted.  Stable  # Currently on Ninlaro-Dex [start again 12/11]- PR noted; Jan 2020-0.3 -Kappa light chains- 2; M protein= 0.2 gm/dl.  Stable  # Diarrhea/enteritis- ? Viral; less likley sec to Ninalaro.   # Osteonecrosis of the jaw- STABLE. Denosumab discontinued.  #October 2019 PET scan Bil scattered lung nodules- up to 5 mm-  Repeat CT in march 2020-STABLE; will order CT scan at next visit.   # chronic pain/anxiety-STABLE; - on fentanyl [165mg] q48 hours; and Dilaudid 2 mg every 4 hours.  Long discussion with patient regarding importance of compliance with his narcotic pain prescription.  Pain contract was discussed and signed.  # COPD COPD wheezing stable.  Continue inhalers.  # Chronic nausea-question secondary to Ninlaro.  Continue Zofran as needed.  Stable.  DISPOSITION;  DISPOSITION;  # follow up in 3 week - MD- cbc/cmp/MM panel; K-l light chains- Dr.B

## 2018-02-21 NOTE — Progress Notes (Signed)
[facility] OFFICE PROGRESS NOTE  Patient Care Team: Petra Kuba, MD as PCP - General (Family Medicine)  Cancer Staging No matching staging information was found for the patient.   Oncology History   # SEP 2015- MULTIPLE MYELOMA [multiple PET pos Bone lesions; hypercalcemia s/p BMBx; FISH- Aneuploidy - gain of chromosome 7,9,15,FGFR3/4p16.3, and CCND1/11q13. Loss of MAF/16q23.1), cytogenetics normal 46XY] s/p Vel-Dex-Rev-Zometa; Excellent PR;   # MARCH 2016- REV-DEX ; FEB 2017-[discn Dex] Rev 25 mg 3 w On & 1 w Off; March 2017- M- protein 0.4gm/dl; K/L= 7.9; cont Rev;   # Revlimid HELD June 2018 [sec to [facility]]  # AUG 2019- RECURRENCE- NINLARO-DEX  # March 2017-  Left Middle Lobe cavitary lesion- ~44m- repeat Ct in 3-494m# AUG 2017- [facility] [[name], [place] #  ? [facility]- [id number] [phone] [id number] not follow up ]- DISCONT Zometa.; NO HBO sec to   # Chronic pain/Anxiety [ortho]  # COPD/smoking/ [[facility]- not candidate for BMT- sec to co-morbidities/poor nutritional status];     ------------------------------------------------------------------  DIAGNOSIS: MULTIPLE MYELOMA  STAGE: RECURRENT  ;GOALS: CONTROL  CURRENT/MOST RECENT THERAPY; AUG 2019- NINLARO-DEX      Multiple myeloma in relapse ([facility]     INTERVAL HISTORY:  [name] [age].o.  male pleasant patient above history of recurrent/relapse multiple myeloma-currently on Ninlaro is here for follow-up.  In the interim patient was admitted to hospital for severe diarrhea. CT scan showed colitis.  Diarrhea is improved.  Patient continues to have chronic back pain continues to be on fentanyl patches/Dilaudid.  Patient does complain of "worsening pain all over"-needing to take extra dose of Dilaudid.  Especially after recent admission.  Currently pain is improved.  Continues to have chronic shortness of breath chronic cough.  Chronic wheezing.  Continues to work..  Review of Systems  Constitutional: Positive for  malaise/fatigue. Negative for chills, diaphoresis and fever.  HENT: Negative for nosebleeds and sore throat.   Eyes: Negative for double vision.  Respiratory: Positive for cough, shortness of breath and wheezing. Negative for hemoptysis and sputum production.   Cardiovascular: Negative for chest pain, palpitations, orthopnea and leg swelling.  Gastrointestinal: Positive for nausea. Negative for abdominal pain, blood in stool, constipation, diarrhea, heartburn, melena and vomiting.  Genitourinary: Negative for dysuria, frequency and urgency.  Musculoskeletal: Positive for back pain (worse) and joint pain.  Skin: Negative.  Negative for itching and rash.  Neurological: Negative for dizziness, tingling, focal weakness, weakness and headaches.  Endo/Heme/Allergies: Does not bruise/bleed easily.  Psychiatric/Behavioral: Negative for depression. The patient is not nervous/anxious and does not have insomnia.       PAST MEDICAL HISTORY :  Past Medical History:  Diagnosis Date  . Anxiety   . Arthritis   . Atrial septal defect   . CHF (congestive heart failure) ([facility]  . COPD (chronic obstructive pulmonary disease) ([facility]  . Coronary artery disease   . Dysrhythmias   . GERD (gastroesophageal reflux disease)   . Hyperlipidemia   . Multiple myeloma ([facility]  . Multiple myeloma ([facility]  . Myocardial infarction ([facility]  . Substance abuse ([facility]    PAST SURGICAL HISTORY :   Past Surgical History:  Procedure Laterality Date  . CARDIAC DEFIBRILLATOR PLACEMENT      FAMILY HISTORY :   Family History  Problem Relation Age of Onset  . COPD Mother   . CAD Mother   . Heart attack Father   . Prostate cancer Maternal Grandfather   .  Kidney cancer Neg Hx   . Bladder Cancer Neg Hx     SOCIAL HISTORY:   Social History   Tobacco Use  . Smoking status: Current Every Day Smoker    Packs/day: 0.50    Years: 40.00    Pack years: 20.00    Types: Cigarettes  . Smokeless tobacco: Never Used   Substance Use Topics  . Alcohol use: No    Alcohol/week: 0.0 standard drinks  . Drug use: No    ALLERGIES:  has No Known Allergies.  MEDICATIONS:  Current Outpatient Medications  Medication Sig Dispense Refill  . acetaminophen (TYLENOL) 325 MG tablet Take 2 tablets (650 mg total) by mouth every 6 (six) hours as needed for mild pain (or Fever >/= 101).    Marland Kitchen albuterol (PROVENTIL HFA;VENTOLIN HFA) 108 (90 Base) MCG/ACT inhaler Inhale 2 puffs into the lungs every 6 (six) hours as needed for wheezing or shortness of breath. 1 Inhaler 6  . ALPRAZolam (XANAX) 0.5 MG tablet Take 1 tablet (0.5 mg total) by mouth 3 (three) times daily as needed for anxiety. 90 tablet 1  . aspirin 325 MG EC tablet Take 325 mg by mouth daily.    Marland Kitchen atorvastatin (LIPITOR) 20 MG tablet Take 20 mg by mouth daily.  2  . dexamethasone (DECADRON) 4 MG tablet TAKE 3 TABLETS (12 MG TOTAL) BY MOUTH ONCE A WEEK. TAKE WITH BREAKFAST 12 tablet 4  . DULoxetine (CYMBALTA) 30 MG capsule TAKE ONE CAPSULE BY MOUTH EVERY DAY 90 capsule 1  . fentaNYL (DURAGESIC) 100 MCG/HR Place 1 patch onto the skin every other day. Use with a 25 mcg patch for total dose of 125 mcg 15 patch 0  . fentaNYL (DURAGESIC) 25 MCG/HR Place 1 patch onto the skin every other day. Use with a 100 mcg patch for total dose of 125 mcg 15 patch 0  . Fluticasone-Salmeterol (ADVAIR DISKUS) 250-50 MCG/DOSE AEPB Inhale 1 puff into the lungs 2 (two) times daily. 3 each 2  . gabapentin (NEURONTIN) 100 MG capsule Take 2 capsules (200 mg total) by mouth 3 (three) times daily. 180 capsule 3  . guaiFENesin-codeine 100-10 MG/5ML syrup Take 5 mLs by mouth every 4 (four) hours as needed for cough. 120 mL 0  . HYDROmorphone (DILAUDID) 2 MG tablet Take 1 tablet (2 mg total) by mouth every 6 (six) hours as needed for severe pain. 56 tablet 0  . ipratropium-albuterol (DUONEB) 0.5-2.5 (3) MG/3ML SOLN Take 3 mLs by nebulization every 4 (four) hours as needed. 360 mL 3  . megestrol  (MEGACE) 20 MG tablet TAKE ONE TABLET BY MOUTH ONCE DAILY 30 tablet 2  . metoprolol tartrate (LOPRESSOR) 50 MG tablet Take 50 mg by mouth 2 (two) times daily.  2  . naloxone (NARCAN) nasal spray 4 mg/0.1 mL For Opioid Overdose: Call 911 and administer a single spray of Narcan in one nostril. Repeat every 39mns as needed if no or minimal response 1 kit 2  . NINLARO 4 MG capsule TAKE 1 CAPSULE (4 MG TOTAL) BY MOUTH ONCE A WEEK. TAKE FOR 3 WEEKS ON, THEN 1 WEEK OFF. TAKE ON EMPTY STOMACH 1HR BEFORE OR 2HRS AFTER FOOD. 3 capsule 4  . ondansetron (ZOFRAN) 8 MG tablet 1 pill every 8 hours as needed. 40 tablet 0  . OXYGEN Inhale 3 L into the lungs daily.    . tamsulosin (FLOMAX) 0.4 MG CAPS capsule Take 1 capsule (0.4 mg total) by mouth daily. 30 capsule 0  . tiotropium (  SPIRIVA HANDIHALER) 18 MCG inhalation capsule Place 1 capsule (18 mcg total) into inhaler and inhale daily. 30 capsule 6   No current facility-administered medications for this visit.     PHYSICAL EXAMINATION: ECOG PERFORMANCE STATUS: 2 - Symptomatic, <50% confined to bed  BP 95/64 (BP Location: Left Arm, Patient Position: Sitting, Cuff Size: Normal)   Pulse 86   Temp (!) 97.1 F (36.2 C) (Tympanic)   Resp 16   Wt 134 lb 9.6 oz (61.1 kg)   BMI 18.26 kg/m   Filed Weights   02/21/18 1148  Weight: 134 lb 9.6 oz (61.1 kg)    Physical Exam  Constitutional: He is oriented to person, place, and time.  Cachectic appearing Caucasian male patient; 2 L nasal cannula.  Accompanied by family.  He is walking by himself.  HENT:  Head: Normocephalic and atraumatic.  Mouth/Throat: Oropharynx is clear and moist. No oropharyngeal exudate.  Eyes: Pupils are equal, round, and reactive to light.  Neck: Normal range of motion. Neck supple.  Cardiovascular: Normal rate and regular rhythm.  Pulmonary/Chest: No respiratory distress. He has wheezes.  Bilateral decreased air entry.  Abdominal: Soft. Bowel sounds are normal. He exhibits no  distension and no mass. There is no abdominal tenderness. There is no rebound and no guarding.  Musculoskeletal: Normal range of motion.        General: No tenderness or edema.  Neurological: He is alert and oriented to person, place, and time.  Skin: Skin is warm.  Psychiatric: Affect normal.       LABORATORY DATA:  I have reviewed the data as listed    Component Value Date/Time   NA 137 02/17/2018 0410   NA 135 05/12/2014 1343   K 3.7 02/17/2018 0410   K 3.3 (L) 05/12/2014 1343   CL 111 02/17/2018 0410   CL 101 05/12/2014 1343   CO2 21 (L) 02/17/2018 0410   CO2 27 05/12/2014 1343   GLUCOSE 86 02/17/2018 0410   GLUCOSE 77 05/12/2014 1343   BUN 7 02/17/2018 0410   BUN 9 05/12/2014 1343   CREATININE 0.66 02/17/2018 0410   CREATININE 0.89 05/12/2014 1343   CALCIUM 7.9 (L) 02/17/2018 0410   CALCIUM 8.8 (L) 05/12/2014 1343   PROT 8.1 02/15/2018 1152   PROT 5.9 (L) 02/17/2014 1405   ALBUMIN 3.7 02/15/2018 1152   ALBUMIN 2.8 (L) 02/17/2014 1405   AST 15 02/15/2018 1152   AST 9 (L) 02/17/2014 1405   ALT 9 02/15/2018 1152   ALT 12 (L) 02/17/2014 1405   ALKPHOS 49 02/15/2018 1152   ALKPHOS 44 (L) 02/17/2014 1405   BILITOT 0.4 02/15/2018 1152   BILITOT 0.2 02/17/2014 1405   GFRNONAA >60 02/17/2018 0410   GFRNONAA >60 05/12/2014 1343   GFRAA >60 02/17/2018 0410   GFRAA >60 05/12/2014 1343    No results found for: SPEP, UPEP  Lab Results  Component Value Date   WBC 9.6 02/18/2018   NEUTROABS 18.3 (H) 02/15/2018   HGB 10.0 (L) 02/18/2018   HCT 31.0 (L) 02/18/2018   MCV 85.4 02/18/2018   PLT 122 (L) 02/18/2018      Chemistry      Component Value Date/Time   NA 137 02/17/2018 0410   NA 135 05/12/2014 1343   K 3.7 02/17/2018 0410   K 3.3 (L) 05/12/2014 1343   CL 111 02/17/2018 0410   CL 101 05/12/2014 1343   CO2 21 (L) 02/17/2018 0410   CO2 27 05/12/2014 1343  BUN 7 02/17/2018 0410   BUN 9 05/12/2014 1343   CREATININE 0.66 02/17/2018 0410   CREATININE 0.89  05/12/2014 1343      Component Value Date/Time   CALCIUM 7.9 (L) 02/17/2018 0410   CALCIUM 8.8 (L) 05/12/2014 1343   ALKPHOS 49 02/15/2018 1152   ALKPHOS 44 (L) 02/17/2014 1405   AST 15 02/15/2018 1152   AST 9 (L) 02/17/2014 1405   ALT 9 02/15/2018 1152   ALT 12 (L) 02/17/2014 1405   BILITOT 0.4 02/15/2018 1152   BILITOT 0.2 02/17/2014 1405       RADIOGRAPHIC STUDIES: I have personally reviewed the radiological images as listed and agreed with the findings in the report. No results found.   ASSESSMENT & PLAN:  Multiple myeloma in relapse ([place]) # MULTIPLE MYELOMA-AUG 2019-IgG kappa;M protein=0.6; kappa light chains 570/elevated. AUG 2019-  Bone survey shows progressive lesions in the bilateral humeri/femur. Oct 2019- PET- no lytic lesions noted.  Stable  # Currently on Ninlaro-Dex [start again 12/11]- PR noted; Jan 2020-0.3 -Kappa light chains- 2; M protein= 0.2 gm/dl.  Stable  # Diarrhea/enteritis- ? Viral; less likley sec to Ninalaro.   # Osteonecrosis of the jaw- STABLE. Denosumab discontinued.  #October 2019 PET scan Bil scattered lung nodules- up to 5 mm-  Repeat CT in march 2020-STABLE; will order CT scan at next visit.   # chronic pain/anxiety-STABLE; - on fentanyl [144mg] q48 hours; and Dilaudid 2 mg every 4 hours.  Long discussion with patient regarding importance of compliance with his narcotic pain prescription.  Pain contract was discussed and signed.  # COPD COPD wheezing stable.  Continue inhalers.  # Chronic nausea-question secondary to Ninlaro.  Continue Zofran as needed.  Stable.  DISPOSITION;  DISPOSITION;  # follow up in 3 week - MD- cbc/cmp/MM panel; K-l light chains- Dr.B   No orders of the defined types were placed in this encounter.  All questions were answered. The patient knows to call the clinic with any problems, questions or concerns.      GCammie Sickle MD 03/07/2018 9:32 AM

## 2018-02-21 NOTE — Patient Instructions (Signed)
#   HOLD off ninlaro for 2 weeks; and then start on ninlaro as directed.

## 2018-02-23 ENCOUNTER — Other Ambulatory Visit: Payer: Self-pay | Admitting: Internal Medicine

## 2018-02-23 DIAGNOSIS — I639 Cerebral infarction, unspecified: Secondary | ICD-10-CM

## 2018-02-23 HISTORY — DX: Cerebral infarction, unspecified: I63.9

## 2018-02-27 ENCOUNTER — Other Ambulatory Visit: Payer: Medicaid Other

## 2018-02-27 ENCOUNTER — Ambulatory Visit: Payer: Medicaid Other | Admitting: Internal Medicine

## 2018-03-06 ENCOUNTER — Other Ambulatory Visit: Payer: Self-pay | Admitting: *Deleted

## 2018-03-06 DIAGNOSIS — C9000 Multiple myeloma not having achieved remission: Secondary | ICD-10-CM

## 2018-03-07 ENCOUNTER — Inpatient Hospital Stay
Admission: EM | Admit: 2018-03-07 | Discharge: 2018-03-13 | DRG: 065 | Disposition: A | Discharge status: Home / Self Care | Payer: Medicaid Other | Attending: Internal Medicine | Admitting: Internal Medicine

## 2018-03-07 ENCOUNTER — Other Ambulatory Visit: Payer: Self-pay

## 2018-03-07 DIAGNOSIS — F17210 Nicotine dependence, cigarettes, uncomplicated: Secondary | ICD-10-CM | POA: Diagnosis present

## 2018-03-07 DIAGNOSIS — Z8249 Family history of ischemic heart disease and other diseases of the circulatory system: Secondary | ICD-10-CM

## 2018-03-07 DIAGNOSIS — Z79891 Long term (current) use of opiate analgesic: Secondary | ICD-10-CM

## 2018-03-07 DIAGNOSIS — Z7982 Long term (current) use of aspirin: Secondary | ICD-10-CM

## 2018-03-07 DIAGNOSIS — M1990 Unspecified osteoarthritis, unspecified site: Secondary | ICD-10-CM | POA: Diagnosis present

## 2018-03-07 DIAGNOSIS — I110 Hypertensive heart disease with heart failure: Secondary | ICD-10-CM | POA: Diagnosis present

## 2018-03-07 DIAGNOSIS — R410 Disorientation, unspecified: Secondary | ICD-10-CM

## 2018-03-07 DIAGNOSIS — G9340 Encephalopathy, unspecified: Secondary | ICD-10-CM | POA: Diagnosis present

## 2018-03-07 DIAGNOSIS — K219 Gastro-esophageal reflux disease without esophagitis: Secondary | ICD-10-CM | POA: Diagnosis present

## 2018-03-07 DIAGNOSIS — J449 Chronic obstructive pulmonary disease, unspecified: Secondary | ICD-10-CM | POA: Diagnosis present

## 2018-03-07 DIAGNOSIS — F05 Delirium due to known physiological condition: Secondary | ICD-10-CM | POA: Diagnosis present

## 2018-03-07 DIAGNOSIS — Z7951 Long term (current) use of inhaled steroids: Secondary | ICD-10-CM

## 2018-03-07 DIAGNOSIS — I959 Hypotension, unspecified: Secondary | ICD-10-CM | POA: Diagnosis not present

## 2018-03-07 DIAGNOSIS — Z515 Encounter for palliative care: Secondary | ICD-10-CM

## 2018-03-07 DIAGNOSIS — I471 Supraventricular tachycardia: Secondary | ICD-10-CM | POA: Diagnosis not present

## 2018-03-07 DIAGNOSIS — R441 Visual hallucinations: Secondary | ICD-10-CM | POA: Diagnosis present

## 2018-03-07 DIAGNOSIS — R443 Hallucinations, unspecified: Secondary | ICD-10-CM

## 2018-03-07 DIAGNOSIS — R4182 Altered mental status, unspecified: Secondary | ICD-10-CM | POA: Diagnosis present

## 2018-03-07 DIAGNOSIS — Z79899 Other long term (current) drug therapy: Secondary | ICD-10-CM

## 2018-03-07 DIAGNOSIS — Q211 Atrial septal defect: Secondary | ICD-10-CM

## 2018-03-07 DIAGNOSIS — G8929 Other chronic pain: Secondary | ICD-10-CM | POA: Diagnosis present

## 2018-03-07 DIAGNOSIS — C9002 Multiple myeloma in relapse: Secondary | ICD-10-CM | POA: Diagnosis present

## 2018-03-07 DIAGNOSIS — Z825 Family history of asthma and other chronic lower respiratory diseases: Secondary | ICD-10-CM

## 2018-03-07 DIAGNOSIS — I2510 Atherosclerotic heart disease of native coronary artery without angina pectoris: Secondary | ICD-10-CM | POA: Diagnosis present

## 2018-03-07 DIAGNOSIS — Z9981 Dependence on supplemental oxygen: Secondary | ICD-10-CM

## 2018-03-07 DIAGNOSIS — J9610 Chronic respiratory failure, unspecified whether with hypoxia or hypercapnia: Secondary | ICD-10-CM | POA: Diagnosis present

## 2018-03-07 DIAGNOSIS — R339 Retention of urine, unspecified: Secondary | ICD-10-CM | POA: Diagnosis not present

## 2018-03-07 DIAGNOSIS — I5032 Chronic diastolic (congestive) heart failure: Secondary | ICD-10-CM | POA: Diagnosis present

## 2018-03-07 DIAGNOSIS — E860 Dehydration: Secondary | ICD-10-CM | POA: Diagnosis present

## 2018-03-07 DIAGNOSIS — R29700 NIHSS score 0: Secondary | ICD-10-CM | POA: Diagnosis present

## 2018-03-07 DIAGNOSIS — I639 Cerebral infarction, unspecified: Principal | ICD-10-CM | POA: Diagnosis present

## 2018-03-07 DIAGNOSIS — I252 Old myocardial infarction: Secondary | ICD-10-CM

## 2018-03-07 DIAGNOSIS — I6523 Occlusion and stenosis of bilateral carotid arteries: Secondary | ICD-10-CM | POA: Diagnosis present

## 2018-03-07 DIAGNOSIS — Z95810 Presence of automatic (implantable) cardiac defibrillator: Secondary | ICD-10-CM

## 2018-03-07 DIAGNOSIS — R4587 Impulsiveness: Secondary | ICD-10-CM | POA: Diagnosis present

## 2018-03-07 DIAGNOSIS — Z8042 Family history of malignant neoplasm of prostate: Secondary | ICD-10-CM

## 2018-03-07 DIAGNOSIS — F419 Anxiety disorder, unspecified: Secondary | ICD-10-CM | POA: Diagnosis present

## 2018-03-07 DIAGNOSIS — E785 Hyperlipidemia, unspecified: Secondary | ICD-10-CM | POA: Diagnosis present

## 2018-03-07 LAB — COMPREHENSIVE METABOLIC PANEL
ALT: 7 U/L (ref 0–44)
AST: 14 U/L — ABNORMAL LOW (ref 15–41)
Albumin: 3.6 g/dL (ref 3.5–5.0)
Alkaline Phosphatase: 44 U/L (ref 38–126)
Anion gap: 7 (ref 5–15)
BUN: 13 mg/dL (ref 6–20)
CO2: 30 mmol/L (ref 22–32)
Calcium: 9 mg/dL (ref 8.9–10.3)
Chloride: 100 mmol/L (ref 98–111)
Creatinine, Ser: 0.89 mg/dL (ref 0.61–1.24)
GFR calc Af Amer: >60 mL/min (ref >60)
GFR calc non Af Amer: >60 mL/min (ref >60)
Glucose, Bld: 95 mg/dL (ref 70–99)
Potassium: 3.8 mmol/L (ref 3.5–5.1)
Sodium: 137 mmol/L (ref 135–145)
Total Bilirubin: 0.8 mg/dL (ref 0.3–1.2)
Total Protein: 6.9 g/dL (ref 6.5–8.1)

## 2018-03-07 LAB — URINALYSIS, COMPLETE (UACMP) WITH MICROSCOPIC
Bacteria, UA: NONE SEEN
Bilirubin Urine: NEGATIVE
Glucose, UA: NEGATIVE mg/dL
HGB URINE DIPSTICK: NEGATIVE
Ketones, ur: NEGATIVE mg/dL
Leukocytes,Ua: NEGATIVE
Nitrite: NEGATIVE
Protein, ur: NEGATIVE mg/dL
Specific Gravity, Urine: 1.018 (ref 1.005–1.030)
Squamous Epithelial / HPF: NONE SEEN (ref 0–5)
pH: 6 (ref 5.0–8.0)

## 2018-03-07 LAB — CBC WITH DIFFERENTIAL/PLATELET
Abs Immature Granulocytes: 0.07 10*3/uL (ref 0.00–0.07)
BASOS PCT: 0 %
Basophils Absolute: 0 10*3/uL (ref 0.0–0.1)
EOS ABS: 0.1 10*3/uL (ref 0.0–0.5)
Eosinophils Relative: 1 %
HCT: 34.8 % — ABNORMAL LOW (ref 39.0–52.0)
Hemoglobin: 10.8 g/dL — ABNORMAL LOW (ref 13.0–17.0)
Immature Granulocytes: 1 %
Lymphocytes Relative: 6 %
Lymphs Abs: 0.7 10*3/uL (ref 0.7–4.0)
MCH: 27.9 pg (ref 26.0–34.0)
MCHC: 31 g/dL (ref 30.0–36.0)
MCV: 89.9 fL (ref 80.0–100.0)
Monocytes Absolute: 1.1 10*3/uL — ABNORMAL HIGH (ref 0.1–1.0)
Monocytes Relative: 8 %
Neutro Abs: 10.8 10*3/uL — ABNORMAL HIGH (ref 1.7–7.7)
Neutrophils Relative %: 84 %
PLATELETS: 209 10*3/uL (ref 150–400)
RBC: 3.87 MIL/uL — ABNORMAL LOW (ref 4.22–5.81)
RDW: 14.7 % (ref 11.5–15.5)
WBC: 12.8 10*3/uL — ABNORMAL HIGH (ref 4.0–10.5)
nRBC: 0 % (ref 0.0–0.2)

## 2018-03-07 LAB — BLOOD GAS, VENOUS
Acid-Base Excess: 2.9 mmol/L — ABNORMAL HIGH (ref 0.0–2.0)
Bicarbonate: 29.8 mmol/L — ABNORMAL HIGH (ref 20.0–28.0)
O2 Saturation: 61.8 %
PCO2 VEN: 54 mmHg (ref 44.0–60.0)
PH VEN: 7.35 (ref 7.250–7.430)
Patient temperature: 37
pO2, Ven: 34 mmHg (ref 32.0–45.0)

## 2018-03-07 LAB — URINE DRUG SCREEN, QUALITATIVE (ARMC ONLY)
Amphetamines, Ur Screen: NOT DETECTED
BENZODIAZEPINE, UR SCRN: POSITIVE — AB
Barbiturates, Ur Screen: NOT DETECTED
Cannabinoid 50 Ng, Ur ~~LOC~~: POSITIVE — AB
Cocaine Metabolite,Ur ~~LOC~~: NOT DETECTED
MDMA (Ecstasy)Ur Screen: NOT DETECTED
Methadone Scn, Ur: NOT DETECTED
Opiate, Ur Screen: POSITIVE — AB
Phencyclidine (PCP) Ur S: NOT DETECTED
Tricyclic, Ur Screen: NOT DETECTED

## 2018-03-07 MED ORDER — FENTANYL 25 MCG/HR TD PT72
1.0000 | MEDICATED_PATCH | TRANSDERMAL | Status: DC
  Administered 2018-03-07 – 2018-03-13 (×4): 1 via TRANSDERMAL
  Filled 2018-03-07 (×4): qty 1

## 2018-03-07 MED ORDER — ASPIRIN EC 325 MG PO TBEC
325.0000 mg | DELAYED_RELEASE_TABLET | Freq: Every day | ORAL | Status: DC
  Administered 2018-03-07 – 2018-03-11 (×4): 325 mg via ORAL
  Filled 2018-03-07 (×4): qty 1

## 2018-03-07 MED ORDER — ACETAMINOPHEN 325 MG PO TABS
650.0000 mg | ORAL_TABLET | Freq: Four times a day (QID) | ORAL | Status: DC | PRN
  Administered 2018-03-09 – 2018-03-13 (×6): 650 mg via ORAL
  Filled 2018-03-07 (×5): qty 2

## 2018-03-07 MED ORDER — ALBUTEROL SULFATE (2.5 MG/3ML) 0.083% IN NEBU: 2.5000 mg | INHALATION_SOLUTION | Freq: Four times a day (QID) | RESPIRATORY_TRACT | Status: DC | PRN

## 2018-03-07 MED ORDER — FENTANYL 100 MCG/HR TD PT72
1.0000 | MEDICATED_PATCH | TRANSDERMAL | Status: DC
  Administered 2018-03-07 – 2018-03-13 (×4): 1 via TRANSDERMAL
  Filled 2018-03-07 (×6): qty 1

## 2018-03-07 MED ORDER — ALPRAZOLAM 0.5 MG PO TABS
0.5000 mg | ORAL_TABLET | Freq: Three times a day (TID) | ORAL | Status: DC | PRN
  Administered 2018-03-09 – 2018-03-13 (×6): 0.5 mg via ORAL
  Filled 2018-03-07 (×6): qty 1

## 2018-03-07 MED ORDER — FLUTICASONE FUROATE-VILANTEROL 200-25 MCG/INH IN AEPB
1.0000 | INHALATION_SPRAY | Freq: Every day | RESPIRATORY_TRACT | Status: DC
  Administered 2018-03-07 – 2018-03-13 (×4): 1 via RESPIRATORY_TRACT
  Filled 2018-03-07 (×2): qty 28

## 2018-03-07 MED ORDER — IPRATROPIUM-ALBUTEROL 0.5-2.5 (3) MG/3ML IN SOLN: 3.0000 mL | RESPIRATORY_TRACT | Status: DC | PRN

## 2018-03-07 MED ORDER — HYDROMORPHONE HCL 2 MG PO TABS: 2.0000 mg | ORAL_TABLET | Freq: Four times a day (QID) | ORAL | 0 refills | Status: DC | PRN
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
## 2018-03-07 NOTE — ED Provider Notes (Signed)
[facility] Emergency Department Provider Note    First MD Initiated Contact with Patient 03/07/18 1613     (approximate)  I have reviewed the triage vital signs and the nursing notes.   HISTORY  Chief Complaint Hallucinations    HPI [name] is a 58 y.o. male the below listed past medical history presents the ER via EMS under IVC for hallucination and reported agitation and aggressive behavior towards family members.  Patient does seem quite disorganized on exam.  He is unable to provide much of a history as to why he was brought to the ER.  Very difficult to follow.  Will moment he is talking about his "fiance going out and get another baby "quickly followed by "they were trying to move my for bed double wide with a pickup truck in the rain."    Past Medical History:  Diagnosis Date  . Anxiety   . Arthritis   . Atrial septal defect   . CHF (congestive heart failure) ([place])   . COPD (chronic obstructive pulmonary disease) ([place])   . Coronary artery disease   . Dysrhythmias   . GERD (gastroesophageal reflux disease)   . Hyperlipidemia   . Multiple myeloma ([place])   . Multiple myeloma ([place])   . Myocardial infarction ([place])   . Substance abuse ([place])    Family History  Problem Relation Age of Onset  . COPD Mother   . CAD Mother   . Heart attack Father   . Prostate cancer Maternal Grandfather   . Kidney cancer Neg Hx   . Bladder Cancer Neg Hx    Past Surgical History:  Procedure Laterality Date  . CARDIAC DEFIBRILLATOR PLACEMENT     Patient Active Problem List   Diagnosis Date Noted  . Gastroenteritis 02/15/2018  . Fatigue due to treatment 09/07/2016  . Palliative care by specialist   . Advance care planning   . Other insomnia   . Protein-calorie malnutrition, severe 05/25/2016  . Aspiration pneumonia ([place]) 05/23/2016  . Cough in adult 05/05/2016  . Multiple myeloma in relapse ([place]) 09/09/2015  . Congestive heart failure ([place])  01/06/2015  . Cardiomyopathy ([place]) 01/06/2015  . Nonsustained ventricular tachycardia ([place]) 01/06/2015  . Malnutrition of moderate degree 12/24/2014  . Pressure ulcer 12/24/2014  . Cellulitis of second finger of left hand   . Sepsis ([place]) 12/23/2014  . 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 Arteriosclerosis of coronary artery 08/22/2011  . Cardiac conduction disorder 08/22/2011  . Myocardial infarction ([place]) 08/22/2011  . Cardiac arrhythmia 08/22/2011  . Anxiety 08/21/2011  . Acid reflux 08/21/2011  . Chronic obstructive pulmonary disease ([place]) 07/26/2011  . Nicotine addiction 07/26/2011      Prior to Admission medications   Medication Sig Start Date End Date Taking? Authorizing Provider  acetaminophen (TYLENOL) 325 MG tablet Take 2 tablets (650 mg total) by mouth every 6 (six) hours as needed for mild pain (or Fever >/= 101). 05/28/16  Yes Gouru, Illene Silver, MD  albuterol (PROVENTIL HFA;VENTOLIN HFA) 108 (90 Base) MCG/ACT inhaler Inhale 2 puffs into the lungs every 6 (six) hours as needed for wheezing or shortness of breath. 04/09/17  Yes Flora Lipps, MD  ALPRAZolam Duanne Moron) 0.5 MG tablet Take 1 tablet (0.5 mg total) by mouth 3 (three) times daily as needed for anxiety. 02/11/18  Yes Cammie Sickle, MD  aspirin 325 MG EC tablet Take 325 mg by mouth daily.   Yes [provider]  atorvastatin (LIPITOR) 20  MG tablet Take 20 mg by mouth daily. 10/11/16  Yes [provider]  dexamethasone (DECADRON) 4 MG tablet TAKE 3 TABLETS (12 MG TOTAL) BY MOUTH ONCE A WEEK. TAKE WITH BREAKFAST 01/18/18  Yes Lloyd Huger, MD  DULoxetine (CYMBALTA) 30 MG capsule TAKE ONE CAPSULE BY MOUTH EVERY DAY 11/21/17  Yes Cammie Sickle, MD  fentaNYL (DURAGESIC) 100 MCG/HR Place 1 patch onto the skin every other day. Use with a 25 mcg patch for total dose of 125 mcg 02/21/18  Yes Cammie Sickle, MD  fentaNYL (DURAGESIC) 25 MCG/HR Place 1 patch onto  the skin every other day. Use with a 100 mcg patch for total dose of 125 mcg 02/21/18  Yes Cammie Sickle, MD  Fluticasone-Salmeterol (ADVAIR DISKUS) 250-50 MCG/DOSE AEPB Inhale 1 puff into the lungs 2 (two) times daily. 07/20/17 07/20/18 Yes Kasa, Maretta Bees, MD  gabapentin (NEURONTIN) 100 MG capsule Take 2 capsules (200 mg total) by mouth 3 (three) times daily. 11/27/17  Yes Cammie Sickle, MD  HYDROmorphone (DILAUDID) 2 MG tablet Take 1 tablet (2 mg total) by mouth every 6 (six) hours as needed for severe pain. 03/07/18  Yes Cammie Sickle, MD  ipratropium-albuterol (DUONEB) 0.5-2.5 (3) MG/3ML SOLN Take 3 mLs by nebulization every 4 (four) hours as needed. 04/09/17  Yes Flora Lipps, MD  megestrol (MEGACE) 20 MG tablet TAKE ONE TABLET BY MOUTH ONCE DAILY 02/26/18  Yes Cammie Sickle, MD  metoprolol tartrate (LOPRESSOR) 50 MG tablet Take 50 mg by mouth 2 (two) times daily. 08/21/16  Yes [provider]  naloxone (NARCAN) nasal spray 4 mg/0.1 mL For Opioid Overdose: Call 911 and administer a single spray of Narcan in one nostril. Repeat every 55mns as needed if no or minimal response 02/20/18  Yes Brahmanday, GElisha Headland MD  NINLARO 4 MG capsule TAKE 1 CAPSULE (4 MG TOTAL) BY MOUTH ONCE A WEEK. TAKE FOR 3 WEEKS ON, THEN 1 WEEK OFF. TAKE ON EMPTY STOMACH 1HR BEFORE OR 2HRS AFTER FOOD. 01/18/18  Yes FLloyd Huger MD  ondansetron (ZOFRAN) 8 MG tablet 1 pill every 8 hours as needed. 10/31/17  Yes BCammie Sickle MD  tamsulosin (FLOMAX) 0.4 MG CAPS capsule Take 1 capsule (0.4 mg total) by mouth daily. 02/18/18  Yes PDustin Flock MD  tiotropium (SPIRIVA HANDIHALER) 18 MCG inhalation capsule Place 1 capsule (18 mcg total) into inhaler and inhale daily. 04/09/17 04/09/18 Yes KFlora Lipps MD    Allergies Patient has no known allergies.    Social History Social History   Tobacco Use  . Smoking status: Current Every Day Smoker    Packs/day: 0.50    Years: 40.00     Pack years: 20.00    Types: Cigarettes  . Smokeless tobacco: Never Used  Substance Use Topics  . Alcohol use: No    Alcohol/week: 0.0 standard drinks  . Drug use: No    Review of Systems Patient denies headaches, rhinorrhea, blurry vision, numbness, shortness of breath, chest pain, edema, cough, abdominal pain, nausea, vomiting, diarrhea, dysuria, fevers, rashes or hallucinations unless otherwise stated above in HPI. ____________________________________________   PHYSICAL EXAM:  VITAL SIGNS: Vitals:   03/07/18 2000 03/07/18 2200  BP: 105/67 104/69  Pulse:    Resp:    Temp:    SpO2:      Constitutional: Alert, but disheveled appearing wearing supplemental oxygen at his baseline. Eyes: Conjunctivae are normal.  Head: Atraumatic. Nose: No congestion/rhinnorhea. Mouth/Throat: Mucous membranes are moist.  Neck: No stridor. Painless ROM.  Cardiovascular: Normal rate, regular rhythm. Grossly normal heart sounds.  Good peripheral circulation. Respiratory: Normal respiratory effort.  No retractions. Lungs with scattered wheeze throughout Gastrointestinal: Soft and nontender. No distention. No abdominal bruits. No CVA tenderness. Genitourinary:  Musculoskeletal: No lower extremity tenderness nor edema.  No joint effusions. Neurologic:  Normal speech and language. No gross focal neurologic deficits are appreciated. No facial droop Skin:  Skin is warm, dry and intact. No rash noted. Psychiatric: Disorganized thought process, very difficult to follow.  No agitation at this time with staff. ____________________________________________   LABS (all labs ordered are listed, but only abnormal results are displayed)  Results for orders placed or performed during the hospital encounter of 03/07/18 (from the past 24 hour(s))  CBC with Differential/Platelet     Status: Abnormal   Collection Time: 03/07/18  4:20 PM  Result Value Ref Range   WBC 12.8 (H) 4.0 - 10.5 K/uL   RBC 3.87 (L) 4.22  - 5.81 MIL/uL   Hemoglobin 10.8 (L) 13.0 - 17.0 g/dL   HCT 34.8 (L) 39.0 - 52.0 %   MCV 89.9 80.0 - 100.0 fL   MCH 27.9 26.0 - 34.0 pg   MCHC 31.0 30.0 - 36.0 g/dL   RDW 14.7 11.5 - 15.5 %   Platelets 209 150 - 400 K/uL   nRBC 0.0 0.0 - 0.2 %   Neutrophils Relative % 84 %   Neutro Abs 10.8 (H) 1.7 - 7.7 K/uL   Lymphocytes Relative 6 %   Lymphs Abs 0.7 0.7 - 4.0 K/uL   Monocytes Relative 8 %   Monocytes Absolute 1.1 (H) 0.1 - 1.0 K/uL   Eosinophils Relative 1 %   Eosinophils Absolute 0.1 0.0 - 0.5 K/uL   Basophils Relative 0 %   Basophils Absolute 0.0 0.0 - 0.1 K/uL   Immature Granulocytes 1 %   Abs Immature Granulocytes 0.07 0.00 - 0.07 K/uL  Comprehensive metabolic panel     Status: Abnormal   Collection Time: 03/07/18  4:20 PM  Result Value Ref Range   Sodium 137 135 - 145 mmol/L   Potassium 3.8 3.5 - 5.1 mmol/L   Chloride 100 98 - 111 mmol/L   CO2 30 22 - 32 mmol/L   Glucose, Bld 95 70 - 99 mg/dL   BUN 13 6 - 20 mg/dL   Creatinine, Ser 0.89 0.61 - 1.24 mg/dL   Calcium 9.0 8.9 - 10.3 mg/dL   Total Protein 6.9 6.5 - 8.1 g/dL   Albumin 3.6 3.5 - 5.0 g/dL   AST 14 (L) 15 - 41 U/L   ALT 7 0 - 44 U/L   Alkaline Phosphatase 44 38 - 126 U/L   Total Bilirubin 0.8 0.3 - 1.2 mg/dL   GFR calc non Af Amer >60 >60 mL/min   GFR calc Af Amer >60 >60 mL/min   Anion gap 7 5 - 15  Blood gas, venous     Status: Abnormal   Collection Time: 03/07/18  4:20 PM  Result Value Ref Range   pH, Ven 7.35 7.250 - 7.430   pCO2, Ven 54 44.0 - 60.0 mmHg   pO2, Ven 34.0 32.0 - 45.0 mmHg   Bicarbonate 29.8 (H) 20.0 - 28.0 mmol/L   Acid-Base Excess 2.9 (H) 0.0 - 2.0 mmol/L   O2 Saturation 61.8 %   Patient temperature 37.0    Collection site VENOUS    Sample type VENOUS   Urine Drug Screen, Qualitative [facility]  only)     Status: Abnormal   Collection Time: 03/07/18 10:01 PM  Result Value Ref Range   Tricyclic, Ur Screen NONE DETECTED NONE DETECTED   Amphetamines, Ur Screen NONE DETECTED NONE  DETECTED   MDMA (Ecstasy)Ur Screen NONE DETECTED NONE DETECTED   Cocaine Metabolite,Ur [place] NONE DETECTED NONE DETECTED   Opiate, Ur Screen POSITIVE (A) NONE DETECTED   Phencyclidine (PCP) Ur S NONE DETECTED NONE DETECTED   Cannabinoid 50 Ng, Ur [place] POSITIVE (A) NONE DETECTED   Barbiturates, Ur Screen NONE DETECTED NONE DETECTED   Benzodiazepine, Ur Scrn POSITIVE (A) NONE DETECTED   Methadone Scn, Ur NONE DETECTED NONE DETECTED  Urinalysis, Complete w Microscopic     Status: Abnormal   Collection Time: 03/07/18 10:01 PM  Result Value Ref Range   Color, Urine YELLOW (A) YELLOW   APPearance CLEAR (A) CLEAR   Specific Gravity, Urine 1.018 1.005 - 1.030   pH 6.0 5.0 - 8.0   Glucose, UA NEGATIVE NEGATIVE mg/dL   Hgb urine dipstick NEGATIVE NEGATIVE   Bilirubin Urine NEGATIVE NEGATIVE   Ketones, ur NEGATIVE NEGATIVE mg/dL   Protein, ur NEGATIVE NEGATIVE mg/dL   Nitrite NEGATIVE NEGATIVE   Leukocytes,Ua NEGATIVE NEGATIVE   RBC / HPF 0-5 0 - 5 RBC/hpf   WBC, UA 0-5 0 - 5 WBC/hpf   Bacteria, UA NONE SEEN NONE SEEN   Squamous Epithelial / LPF NONE SEEN 0 - 5   Mucus PRESENT    ____________________________________________ ____________________________________________  RADIOLOGY  I personally reviewed all radiographic images ordered to evaluate for the above acute complaints and reviewed radiology reports and findings.  These findings were personally discussed with the patient.  Please see medical record for radiology report.  ____________________________________________   PROCEDURES  Procedure(s) performed:  Procedures    Critical Care performed: no ____________________________________________   INITIAL IMPRESSION / ASSESSMENT AND PLAN / ED COURSE  Pertinent labs & imaging results that were available during my care of the patient were reviewed by me and considered in my medical decision making (see chart for details).   DDX: Psychosis, delirium, medication effect,  noncompliance, polysubstance abuse, Si, Hi, depression   Schneur A Pezzullo is a 58 y.o. who presents to the ED with symptoms as described above.  Patient with multiple chronic comorbidities.  Blood work sent to evaluate for the above differential is reassuring.  Based on his presenting complaints will continue IVC and have patient evaluated by psychiatry.  Have discussed with the patient and available family all diagnostics and treatments performed thus far and all questions were answered to the best of my ability. The patient demonstrates understanding and agreement with plan.       As part of my medical decision making, I reviewed the following data within the [place] notes reviewed and incorporated, Labs reviewed, notes from prior ED visits and [place] Controlled Substance Database   ____________________________________________   FINAL CLINICAL IMPRESSION(S) / ED DIAGNOSES  Final diagnoses:  Hallucinations  Chronic obstructive pulmonary disease, unspecified COPD type ([place])      NEW MEDICATIONS STARTED DURING THIS VISIT:  New Prescriptions   No medications on file     Note:  This document was prepared using Dragon voice recognition software and may include unintentional dictation errors.    Merlyn Lot, MD 03/07/18 [phone]

## 2018-03-07 NOTE — ED Notes (Signed)
Psychiatrist at bedside

## 2018-03-07 NOTE — ED Notes (Signed)
Pts girl friend Megan: [phone]. Call for questions or dc home.

## 2018-03-08 ENCOUNTER — Observation Stay: Payer: Medicaid Other

## 2018-03-08 ENCOUNTER — Emergency Department: Payer: Medicaid Other

## 2018-03-08 ENCOUNTER — Encounter: Payer: Self-pay | Admitting: Internal Medicine

## 2018-03-08 DIAGNOSIS — R4182 Altered mental status, unspecified: Secondary | ICD-10-CM | POA: Diagnosis not present

## 2018-03-08 DIAGNOSIS — R339 Retention of urine, unspecified: Secondary | ICD-10-CM | POA: Diagnosis not present

## 2018-03-08 DIAGNOSIS — R4587 Impulsiveness: Secondary | ICD-10-CM | POA: Diagnosis present

## 2018-03-08 DIAGNOSIS — I959 Hypotension, unspecified: Secondary | ICD-10-CM | POA: Diagnosis not present

## 2018-03-08 DIAGNOSIS — I639 Cerebral infarction, unspecified: Secondary | ICD-10-CM | POA: Diagnosis present

## 2018-03-08 DIAGNOSIS — I6389 Other cerebral infarction: Secondary | ICD-10-CM | POA: Diagnosis not present

## 2018-03-08 DIAGNOSIS — I2510 Atherosclerotic heart disease of native coronary artery without angina pectoris: Secondary | ICD-10-CM | POA: Diagnosis present

## 2018-03-08 DIAGNOSIS — I252 Old myocardial infarction: Secondary | ICD-10-CM | POA: Diagnosis not present

## 2018-03-08 DIAGNOSIS — R29700 NIHSS score 0: Secondary | ICD-10-CM | POA: Diagnosis present

## 2018-03-08 DIAGNOSIS — Q211 Atrial septal defect: Secondary | ICD-10-CM | POA: Diagnosis not present

## 2018-03-08 DIAGNOSIS — F05 Delirium due to known physiological condition: Secondary | ICD-10-CM | POA: Diagnosis present

## 2018-03-08 DIAGNOSIS — M1990 Unspecified osteoarthritis, unspecified site: Secondary | ICD-10-CM | POA: Diagnosis present

## 2018-03-08 DIAGNOSIS — C9002 Multiple myeloma in relapse: Secondary | ICD-10-CM | POA: Diagnosis present

## 2018-03-08 DIAGNOSIS — E860 Dehydration: Secondary | ICD-10-CM | POA: Diagnosis present

## 2018-03-08 DIAGNOSIS — I5032 Chronic diastolic (congestive) heart failure: Secondary | ICD-10-CM | POA: Diagnosis present

## 2018-03-08 DIAGNOSIS — R079 Chest pain, unspecified: Secondary | ICD-10-CM | POA: Diagnosis not present

## 2018-03-08 DIAGNOSIS — G9340 Encephalopathy, unspecified: Secondary | ICD-10-CM | POA: Diagnosis present

## 2018-03-08 DIAGNOSIS — R443 Hallucinations, unspecified: Secondary | ICD-10-CM | POA: Diagnosis not present

## 2018-03-08 DIAGNOSIS — R000 Tachycardia, unspecified: Secondary | ICD-10-CM

## 2018-03-08 DIAGNOSIS — R441 Visual hallucinations: Secondary | ICD-10-CM | POA: Diagnosis present

## 2018-03-08 DIAGNOSIS — R410 Disorientation, unspecified: Secondary | ICD-10-CM | POA: Diagnosis not present

## 2018-03-08 DIAGNOSIS — R451 Restlessness and agitation: Secondary | ICD-10-CM | POA: Diagnosis not present

## 2018-03-08 DIAGNOSIS — J449 Chronic obstructive pulmonary disease, unspecified: Secondary | ICD-10-CM | POA: Diagnosis present

## 2018-03-08 DIAGNOSIS — I110 Hypertensive heart disease with heart failure: Secondary | ICD-10-CM | POA: Diagnosis present

## 2018-03-08 DIAGNOSIS — K219 Gastro-esophageal reflux disease without esophagitis: Secondary | ICD-10-CM | POA: Diagnosis present

## 2018-03-08 DIAGNOSIS — G8929 Other chronic pain: Secondary | ICD-10-CM | POA: Diagnosis present

## 2018-03-08 DIAGNOSIS — I471 Supraventricular tachycardia: Secondary | ICD-10-CM | POA: Diagnosis not present

## 2018-03-08 DIAGNOSIS — I6523 Occlusion and stenosis of bilateral carotid arteries: Secondary | ICD-10-CM | POA: Diagnosis present

## 2018-03-08 DIAGNOSIS — Z515 Encounter for palliative care: Secondary | ICD-10-CM | POA: Diagnosis not present

## 2018-03-08 DIAGNOSIS — F419 Anxiety disorder, unspecified: Secondary | ICD-10-CM | POA: Diagnosis present

## 2018-03-08 DIAGNOSIS — E785 Hyperlipidemia, unspecified: Secondary | ICD-10-CM | POA: Diagnosis present

## 2018-03-08 DIAGNOSIS — J9610 Chronic respiratory failure, unspecified whether with hypoxia or hypercapnia: Secondary | ICD-10-CM | POA: Diagnosis present

## 2018-03-08 LAB — LACTIC ACID, PLASMA: Lactic Acid, Venous: 0.9 mmol/L (ref 0.5–1.9)

## 2018-03-08 LAB — AMMONIA: Ammonia: 15 umol/L (ref 9–35)

## 2018-03-08 LAB — VITAMIN B12: Vitamin B-12: 246 pg/mL (ref 180–914)

## 2018-03-08 LAB — TSH: TSH: 1.143 u[IU]/mL (ref 0.350–4.500)

## 2018-03-08 MED ORDER — HALOPERIDOL LACTATE 5 MG/ML IJ SOLN
5.0000 mg | Freq: Once | INTRAMUSCULAR | Status: AC
  Administered 2018-03-08: 5 mg via INTRAMUSCULAR
  Filled 2018-03-08: qty 1

## 2018-03-08 MED ORDER — ACETAMINOPHEN 160 MG/5ML PO SOLN
650.0000 mg | ORAL | Status: DC | PRN
  Filled 2018-03-08: qty 20.3

## 2018-03-08 MED ORDER — STROKE: EARLY STAGES OF RECOVERY BOOK
Freq: Once | Status: AC
  Administered 2018-03-08: 15:00:00

## 2018-03-08 MED ORDER — ACETAMINOPHEN 650 MG RE SUPP: 650.0000 mg | RECTAL | Status: DC | PRN

## 2018-03-08 MED ORDER — LACTATED RINGERS IV BOLUS
1000.0000 mL | Freq: Once | INTRAVENOUS | Status: AC
  Administered 2018-03-08: 23:00:00 1000 mL via INTRAVENOUS

## 2018-03-08 MED ORDER — TIOTROPIUM BROMIDE MONOHYDRATE 18 MCG IN CAPS
18.0000 ug | ORAL_CAPSULE | Freq: Every day | RESPIRATORY_TRACT | Status: DC
  Administered 2018-03-08 – 2018-03-13 (×6): 18 ug via RESPIRATORY_TRACT
  Filled 2018-03-08 (×2): qty 5

## 2018-03-08 MED ORDER — DIPHENHYDRAMINE HCL 50 MG/ML IJ SOLN
50.0000 mg | Freq: Once | INTRAMUSCULAR | Status: AC
  Administered 2018-03-08: 50 mg via INTRAVENOUS
  Filled 2018-03-08: qty 1

## 2018-03-08 MED ORDER — MEGESTROL ACETATE 20 MG PO TABS
20.0000 mg | ORAL_TABLET | Freq: Every day | ORAL | Status: DC
  Administered 2018-03-08 – 2018-03-11 (×4): 20 mg via ORAL
  Filled 2018-03-08 (×4): qty 1

## 2018-03-08 MED ORDER — DULOXETINE HCL 30 MG PO CPEP
30.0000 mg | ORAL_CAPSULE | Freq: Every day | ORAL | Status: DC
  Administered 2018-03-08 – 2018-03-13 (×6): 30 mg via ORAL
  Filled 2018-03-08 (×6): qty 1

## 2018-03-08 MED ORDER — HALOPERIDOL LACTATE 5 MG/ML IJ SOLN
5.0000 mg | Freq: Four times a day (QID) | INTRAMUSCULAR | Status: DC | PRN
  Administered 2018-03-08 – 2018-03-11 (×2): 5 mg via INTRAMUSCULAR
  Filled 2018-03-08 (×2): qty 1

## 2018-03-08 MED ORDER — ASPIRIN 300 MG RE SUPP: 300.0000 mg | Freq: Every day | RECTAL | Status: DC

## 2018-03-08 MED ORDER — LORAZEPAM 2 MG/ML IJ SOLN
1.0000 mg | Freq: Once | INTRAMUSCULAR | Status: AC
  Administered 2018-03-08: 1 mg via INTRAMUSCULAR
  Filled 2018-03-08: qty 1

## 2018-03-08 MED ORDER — SENNOSIDES-DOCUSATE SODIUM 8.6-50 MG PO TABS: 1.0000 | ORAL_TABLET | Freq: Every evening | ORAL | Status: DC | PRN

## 2018-03-08 MED ORDER — LORAZEPAM 2 MG/ML IJ SOLN
0.5000 mg | Freq: Once | INTRAMUSCULAR | Status: AC
  Administered 2018-03-08: 17:00:00 0.5 mg via INTRAVENOUS
  Filled 2018-03-08: qty 1

## 2018-03-08 MED ORDER — ATORVASTATIN CALCIUM 20 MG PO TABS
40.0000 mg | ORAL_TABLET | Freq: Every day | ORAL | Status: DC
  Administered 2018-03-08 – 2018-03-13 (×5): 40 mg via ORAL
  Filled 2018-03-08 (×6): qty 2

## 2018-03-08 MED ORDER — LORAZEPAM 2 MG/ML IJ SOLN
2.0000 mg | Freq: Once | INTRAMUSCULAR | Status: AC
  Administered 2018-03-08: 2 mg via INTRAVENOUS
  Filled 2018-03-08: qty 1

## 2018-03-08 MED ORDER — LORAZEPAM 2 MG/ML IJ SOLN
0.5000 mg | Freq: Four times a day (QID) | INTRAMUSCULAR | Status: DC | PRN
  Administered 2018-03-08: 19:00:00 0.5 mg via INTRAVENOUS
  Filled 2018-03-08: qty 1

## 2018-03-08 MED ORDER — ASPIRIN 325 MG PO TABS
325.0000 mg | ORAL_TABLET | Freq: Every day | ORAL | Status: DC
  Filled 2018-03-08: qty 1

## 2018-03-08 MED ORDER — SODIUM CHLORIDE 0.9 % IV BOLUS
1000.0000 mL | Freq: Once | INTRAVENOUS | Status: AC
  Administered 2018-03-08: 1000 mL via INTRAVENOUS

## 2018-03-08 MED ORDER — ACETAMINOPHEN 325 MG PO TABS
650.0000 mg | ORAL_TABLET | ORAL | Status: DC | PRN
  Filled 2018-03-08 (×2): qty 2

## 2018-03-08 MED ORDER — TAMSULOSIN HCL 0.4 MG PO CAPS
0.4000 mg | ORAL_CAPSULE | Freq: Every day | ORAL | Status: DC
  Administered 2018-03-08 – 2018-03-13 (×6): 0.4 mg via ORAL
  Filled 2018-03-08 (×6): qty 1

## 2018-03-08 MED ORDER — ENOXAPARIN SODIUM 40 MG/0.4ML ~~LOC~~ SOLN
40.0000 mg | SUBCUTANEOUS | Status: DC
  Administered 2018-03-08 – 2018-03-13 (×4): 40 mg via SUBCUTANEOUS
  Filled 2018-03-08 (×4): qty 0.4

## 2018-03-08 NOTE — ED Notes (Signed)
Hourly rounding reveals patient in room. No complaints, stable, in no acute distress. Q15 minute rounds and monitoring via Rover and Officer to continue.   

## 2018-03-08 NOTE — ED Notes (Signed)
Attempted to call report but I was asked to call back

## 2018-03-08 NOTE — BH Assessment (Signed)
TTS consulted pt's nurse and EDP regarding pt's disposition. This Probation officer was informed by the EDP that the previous doctor wanted pt to be reassessed by the on-call psychiatrist.  This writer relayed this information to Dr. Weber Cooks (Psychiatrist On-Call for ED Consults)

## 2018-03-08 NOTE — H&P (Signed)
[facility] [place] NAME: [name]    MR#:  [id number]  DATE OF BIRTH:  03-27-1960  DATE OF ADMISSION:  03/07/2018  PRIMARY CARE PHYSICIAN: Petra Kuba, MD   REQUESTING/REFERRING PHYSICIAN: Dr. Cherylann Banas  CHIEF COMPLAINT:   Chief Complaint  Patient presents with  . Hallucinations    HISTORY OF PRESENT ILLNESS:  [name]  is a 58 y.o. male with a known history of CAD, multiple myeloma chronic back pain, COPD, chronic respiratory failure on 2 L oxygen, diastolic CHF, atrial septal defect, tobacco use presents to the hospital brought in by his girlfriend due to hallucination and agitation at home.  IVC was placed.  Initially thought to be psychosis.  CT scan of the head shows right frontal CVA.  This is new from CT scan of the head in December.  Patient is confused unable to provide history.  Old records reviewed.  Discussed with ED staff.  No focal weakness found.  Being admitted for stroke.  PAST MEDICAL HISTORY:   Past Medical History:  Diagnosis Date  . Anxiety   . Arthritis   . Atrial septal defect   . CHF (congestive heart failure) ([place])   . COPD (chronic obstructive pulmonary disease) ([place])   . Coronary artery disease   . Dysrhythmias   . GERD (gastroesophageal reflux disease)   . Hyperlipidemia   . Multiple myeloma ([place])   . Multiple myeloma ([place])   . Myocardial infarction ([place])   . Substance abuse ([place])     PAST SURGICAL HISTORY:   Past Surgical History:  Procedure Laterality Date  . CARDIAC DEFIBRILLATOR PLACEMENT      SOCIAL HISTORY:   Social History   Tobacco Use  . Smoking status: Current Every Day Smoker    Packs/day: 0.50    Years: 40.00    Pack years: 20.00    Types: Cigarettes  . Smokeless tobacco: Never Used  Substance Use Topics  . Alcohol use: No    Alcohol/week: 0.0 standard drinks    FAMILY HISTORY:   Family History  Problem Relation Age of Onset  . COPD Mother   . CAD  Mother   . Heart attack Father   . Prostate cancer Maternal Grandfather   . Kidney cancer Neg Hx   . Bladder Cancer Neg Hx     DRUG ALLERGIES:  No Known Allergies  REVIEW OF SYSTEMS:   Review of Systems  Unable to perform ROS: Mental status change    MEDICATIONS AT HOME:   Prior to Admission medications   Medication Sig Start Date End Date Taking? Authorizing Provider  acetaminophen (TYLENOL) 325 MG tablet Take 2 tablets (650 mg total) by mouth every 6 (six) hours as needed for mild pain (or Fever >/= 101). 05/28/16  Yes Gouru, Illene Silver, MD  albuterol (PROVENTIL HFA;VENTOLIN HFA) 108 (90 Base) MCG/ACT inhaler Inhale 2 puffs into the lungs every 6 (six) hours as needed for wheezing or shortness of breath. 04/09/17  Yes Flora Lipps, MD  ALPRAZolam Duanne Moron) 0.5 MG tablet Take 1 tablet (0.5 mg total) by mouth 3 (three) times daily as needed for anxiety. 02/11/18  Yes Cammie Sickle, MD  aspirin 325 MG EC tablet Take 325 mg by mouth daily.   Yes [provider]  atorvastatin (LIPITOR) 20 MG tablet Take 20 mg by mouth daily. 10/11/16  Yes [provider]  dexamethasone (DECADRON) 4 MG tablet TAKE 3 TABLETS (12 MG TOTAL) BY MOUTH ONCE  A WEEK. TAKE WITH BREAKFAST 01/18/18  Yes Lloyd Huger, MD  DULoxetine (CYMBALTA) 30 MG capsule TAKE ONE CAPSULE BY MOUTH EVERY DAY 11/21/17  Yes Cammie Sickle, MD  fentaNYL (DURAGESIC) 100 MCG/HR Place 1 patch onto the skin every other day. Use with a 25 mcg patch for total dose of 125 mcg 02/21/18  Yes Cammie Sickle, MD  fentaNYL (DURAGESIC) 25 MCG/HR Place 1 patch onto the skin every other day. Use with a 100 mcg patch for total dose of 125 mcg 02/21/18  Yes Cammie Sickle, MD  Fluticasone-Salmeterol (ADVAIR DISKUS) 250-50 MCG/DOSE AEPB Inhale 1 puff into the lungs 2 (two) times daily. 07/20/17 07/20/18 Yes Kasa, Maretta Bees, MD  gabapentin (NEURONTIN) 100 MG capsule Take 2 capsules (200 mg total) by mouth 3 (three) times  daily. 11/27/17  Yes Cammie Sickle, MD  HYDROmorphone (DILAUDID) 2 MG tablet Take 1 tablet (2 mg total) by mouth every 6 (six) hours as needed for severe pain. 03/07/18  Yes Cammie Sickle, MD  ipratropium-albuterol (DUONEB) 0.5-2.5 (3) MG/3ML SOLN Take 3 mLs by nebulization every 4 (four) hours as needed. 04/09/17  Yes Flora Lipps, MD  megestrol (MEGACE) 20 MG tablet TAKE ONE TABLET BY MOUTH ONCE DAILY 02/26/18  Yes Cammie Sickle, MD  metoprolol tartrate (LOPRESSOR) 50 MG tablet Take 50 mg by mouth 2 (two) times daily. 08/21/16  Yes [provider]  naloxone (NARCAN) nasal spray 4 mg/0.1 mL For Opioid Overdose: Call 911 and administer a single spray of Narcan in one nostril. Repeat every 36mns as needed if no or minimal response 02/20/18  Yes Brahmanday, GElisha Headland MD  NINLARO 4 MG capsule TAKE 1 CAPSULE (4 MG TOTAL) BY MOUTH ONCE A WEEK. TAKE FOR 3 WEEKS ON, THEN 1 WEEK OFF. TAKE ON EMPTY STOMACH 1HR BEFORE OR 2HRS AFTER FOOD. 01/18/18  Yes FLloyd Huger MD  ondansetron (ZOFRAN) 8 MG tablet 1 pill every 8 hours as needed. 10/31/17  Yes BCammie Sickle MD  tamsulosin (FLOMAX) 0.4 MG CAPS capsule Take 1 capsule (0.4 mg total) by mouth daily. 02/18/18  Yes PDustin Flock MD  tiotropium (SPIRIVA HANDIHALER) 18 MCG inhalation capsule Place 1 capsule (18 mcg total) into inhaler and inhale daily. 04/09/17 04/09/18 Yes Kasa, KMaretta Bees MD     VITAL SIGNS:  Blood pressure 104/69, pulse (!) 103, temperature 98.7 F (37.1 C), temperature source Oral, resp. rate 16, height 6' (1.829 m), weight 61 kg, SpO2 92 %.  PHYSICAL EXAMINATION:  Physical Exam  GENERAL:  58y.o.-year-old patient sitting at side of bed.  Tremulous. EYES: Pupils equal, round, reactive to light and accommodation. No scleral icterus. Extraocular muscles intact.  HEENT: Head atraumatic, normocephalic. Oropharynx and nasopharynx clear. No oropharyngeal erythema, moist oral mucosa  NECK:  Supple, no  jugular venous distention. No thyroid enlargement, no tenderness.  LUNGS: Normal breath sounds bilaterally, no wheezing, rales, rhonchi. No use of accessory muscles of respiration.  CARDIOVASCULAR: S1, S2 normal. No murmurs, rubs, or gallops.  ABDOMEN: Soft, nontender, nondistended. Bowel sounds present. No organomegaly or mass.  EXTREMITIES: No pedal edema, cyanosis, or clubbing. + 2 pedal & radial pulses b/l.   NEUROLOGIC: Cranial nerves II through XII are intact. No focal Motor or sensory deficits appreciated b/l.  Tremors present PSYCHIATRIC: The patient is alert and awake.  Confused. SKIN: No obvious rash, lesion, or ulcer.   LABORATORY PANEL:   CBC Recent Labs  Lab 03/07/18 1620  WBC 12.8*  HGB 10.8*  HCT 34.8*  PLT 209   ------------------------------------------------------------------------------------------------------------------  Chemistries  Recent Labs  Lab 03/07/18 1620  NA 137  K 3.8  CL 100  CO2 30  GLUCOSE 95  BUN 13  CREATININE 0.89  CALCIUM 9.0  AST 14*  ALT 7  ALKPHOS 44  BILITOT 0.8   ------------------------------------------------------------------------------------------------------------------  Cardiac Enzymes No results for input(s): TROPONINI in the last 168 hours. ------------------------------------------------------------------------------------------------------------------  RADIOLOGY:  Ct Head Wo Contrast  Result Date: 03/08/2018 CLINICAL DATA:  Altered mental status with hallucinations and agitation EXAM: CT HEAD WITHOUT CONTRAST TECHNIQUE: Contiguous axial images were obtained from the base of the skull through the vertex without intravenous contrast. COMPARISON:  January 03, 2018 FINDINGS: Brain: The ventricles are normal in size and configuration. There is no demonstrable mass, hemorrhage, extra-axial fluid collection, or midline shift. There is a focal area of decreased attenuation in the periphery of the posterior mid right  frontal lobe, concerning for potential early infarct. Elsewhere brain parenchyma appears unremarkable. Vascular: No appreciable hyperdense vessel. There is calcification in each cavernous carotid artery region. Skull: The bony calvarium appears intact. Sinuses/Orbits: There is mucosal thickening in several ethmoid air cells. There is a retention cyst in the inferior right maxillary antrum. Orbits appear symmetric bilaterally. Other: Mastoid air cells are clear. IMPRESSION: Decreased attenuation in the posterior mid right frontal lobe, best appreciated on axial slice 24 series 7. Question early infarct in this area. Brain parenchyma elsewhere appears unremarkable. No mass or hemorrhage. There are foci of arterial vascular calcification. There are areas of paranasal sinus disease. Electronically Signed   By: Lowella Grip III M.D.   On: 03/08/2018 07:38     IMPRESSION AND PLAN:   *Acute CVA of right frontal lobe. -Carotid dopplers, Echo - Start aspirin and statin. - Lovenox for DVT prophylaxis. - PT/OT/Speech consult as needed per symptoms - Neuro checks every 4 hours for 24 hours. - Consult neurology.  Sent message to Dr. Irish Elders No MRI with cardiac defibrillator  *Acute encephalopathy.  Could be due to CVA.  Will also consult psychiatry to rule out psychiatric causes.  He does have history of substance abuse.  Urine drug screen positive for cocaine.  *Hypertension.  Hold medications for permissive hypertension  *COPD with chronic respiratory failure.  On 2 L oxygen.  No wheezing.  Continue home inhalers and nebulizers as needed  *Multiple myeloma.  Follows outpatient with cancer center.  DVT prophylaxis Lovenox  All the records are reviewed and case discussed with ED provider. Management plans discussed with the patient, family and they are in agreement.  CODE STATUS: Presumed full code  TOTAL TIME TAKING CARE OF THIS PATIENT: 40 minutes.   Leia Alf Deyonte Cadden M.D on 03/08/2018 at  1:51 PM  Between 7am to 6pm - Pager - [phone]  After 6pm go to [email or web address] - password [email or web address] [facility]  Office  [phone]  CC: Primary care physician; Petra Kuba, MD  Note: This dictation was prepared with Dragon dictation along with smaller phrase technology. Any transcriptional errors that result from this process are unintentional.

## 2018-03-08 NOTE — Progress Notes (Signed)
Patient receiving second bolus, patient has not voided. Bladder scanned patient with 161mls in bladder. Lung sounds are diminished, coughing at times. Notified Salary.

## 2018-03-08 NOTE — Progress Notes (Signed)
Dr Jerelyn Charles made aware that pts HR 140s, BP 86/56, 97/71, after bolus, new order for STAT EKG, 1L bolus over 2 hrs, interrogate pacemaker, ativan 0.5mg  IV every 6 hrs as needed for anxiety

## 2018-03-08 NOTE — Consult Note (Addendum)
Referring Physician: Hillary Bow MD    Chief Complaint: Altered mental status with behavioral disturbances  HPI: [name] is an 58 y.o. male with past medical history of multiple myeloma, myocardial infarction, COPD on chronic oxygen, CHF, polysubstance abuse, hyperlipidemia, CAD, defibrillator placement, and hyperthyroidism presenting to the ED on 03/07/2018 under IVC for altered mental status associated with behavioral disturbances.  Per patient's fianc who provides most of the history, patient has not been acting his normal self for the past 3 to 4 days.  Patient has had episode of visual hallucinations and delusional thoughts.  He is also been agitated and exhibiting aggressive behaviors towards the fianc.  Per patient's fianc, patient does not have thoughts of harming himself however he was acting violent at home stating he was going to kick their" teeth in".  Patient's fianc took out IVC papers due to concerns of possible violence with intent to harm himself and others. On arrival to the ED, he was afebrile with blood pressure 100/69 mm Hg and pulse rate 108 beats/min. There were no focal neurological deficits; he was alert but disoriented x4, and he did demonstrate disorganized behavior with memory deficits. NIHSS 0.  Initial labs revealed elevated white count 12.8, decreased H&H, comprehensive metabolic panel (CMP) was unremarkable, urinalysis negative for UTI, drug of abuse screen positive for cannabinoid, opiate and benzo, an insidious infectious workup was initiated, including rapid HIV, RPR, and Lyme reflex that are currently pending. Chest X-ray did not show active cardiopulmonary process.  A non-contrast head CT showed decreased attenuation in the posterior mid right frontal lobe . An MRI of the head could not be obtained due to presence of a defibrillator. The patient has remained in IVC with improvement  In behavioral disturbances.   Date last known well: Unable to determine Time  last known well: Unable to determine tPA Given: No: unable to determine LKW  Past Medical History:  Diagnosis Date  . Anxiety   . Arthritis   . Atrial septal defect   . CHF (congestive heart failure) ([place])   . COPD (chronic obstructive pulmonary disease) ([place])   . Coronary artery disease   . Dysrhythmias   . GERD (gastroesophageal reflux disease)   . Hyperlipidemia   . Multiple myeloma ([place])   . Multiple myeloma ([place])   . Myocardial infarction ([place])   . Substance abuse [facility])     Past Surgical History:  Procedure Laterality Date  . CARDIAC DEFIBRILLATOR PLACEMENT      Family History  Problem Relation Age of Onset  . COPD Mother   . CAD Mother   . Heart attack Father   . Prostate cancer Maternal Grandfather   . Kidney cancer Neg Hx   . Bladder Cancer Neg Hx    Social History:  reports that he has been smoking cigarettes. He has a 20.00 pack-year smoking history. He has never used smokeless tobacco. He reports that he does not drink alcohol or use drugs.  Allergies: No Known Allergies  Medications:  I have reviewed the patient's current medications. Prior to Admission: (Not in a hospital admission)  Prior to Admission medications   Medication Sig Start Date End Date Taking? Authorizing Provider  acetaminophen (TYLENOL) 325 MG tablet Take 2 tablets (650 mg total) by mouth every 6 (six) hours as needed for mild pain (or Fever >/= 101). 05/28/16  Yes Gouru, Aruna, MD  albuterol (PROVENTIL HFA;VENTOLIN HFA) 108 (90 Base) MCG/ACT inhaler Inhale 2 puffs into the lungs every 6 (six)  hours as needed for wheezing or shortness of breath. 04/09/17  Yes Flora Lipps, MD  ALPRAZolam Duanne Moron) 0.5 MG tablet Take 1 tablet (0.5 mg total) by mouth 3 (three) times daily as needed for anxiety. 02/11/18  Yes Cammie Sickle, MD  aspirin 325 MG EC tablet Take 325 mg by mouth daily.   Yes [provider]  atorvastatin (LIPITOR) 20 MG tablet Take 20 mg by mouth daily. 10/11/16  Yes  [provider]  dexamethasone (DECADRON) 4 MG tablet TAKE 3 TABLETS (12 MG TOTAL) BY MOUTH ONCE A WEEK. TAKE WITH BREAKFAST 01/18/18  Yes Lloyd Huger, MD  DULoxetine (CYMBALTA) 30 MG capsule TAKE ONE CAPSULE BY MOUTH EVERY DAY 11/21/17  Yes Cammie Sickle, MD  fentaNYL (DURAGESIC) 100 MCG/HR Place 1 patch onto the skin every other day. Use with a 25 mcg patch for total dose of 125 mcg 02/21/18  Yes Cammie Sickle, MD  fentaNYL (DURAGESIC) 25 MCG/HR Place 1 patch onto the skin every other day. Use with a 100 mcg patch for total dose of 125 mcg 02/21/18  Yes Cammie Sickle, MD  Fluticasone-Salmeterol (ADVAIR DISKUS) 250-50 MCG/DOSE AEPB Inhale 1 puff into the lungs 2 (two) times daily. 07/20/17 07/20/18 Yes Kasa, Maretta Bees, MD  gabapentin (NEURONTIN) 100 MG capsule Take 2 capsules (200 mg total) by mouth 3 (three) times daily. 11/27/17  Yes Cammie Sickle, MD  HYDROmorphone (DILAUDID) 2 MG tablet Take 1 tablet (2 mg total) by mouth every 6 (six) hours as needed for severe pain. 03/07/18  Yes Cammie Sickle, MD  ipratropium-albuterol (DUONEB) 0.5-2.5 (3) MG/3ML SOLN Take 3 mLs by nebulization every 4 (four) hours as needed. 04/09/17  Yes Flora Lipps, MD  megestrol (MEGACE) 20 MG tablet TAKE ONE TABLET BY MOUTH ONCE DAILY 02/26/18  Yes Cammie Sickle, MD  metoprolol tartrate (LOPRESSOR) 50 MG tablet Take 50 mg by mouth 2 (two) times daily. 08/21/16  Yes [provider]  naloxone (NARCAN) nasal spray 4 mg/0.1 mL For Opioid Overdose: Call 911 and administer a single spray of Narcan in one nostril. Repeat every 92mns as needed if no or minimal response 02/20/18  Yes Brahmanday, GElisha Headland MD  NINLARO 4 MG capsule TAKE 1 CAPSULE (4 MG TOTAL) BY MOUTH ONCE A WEEK. TAKE FOR 3 WEEKS ON, THEN 1 WEEK OFF. TAKE ON EMPTY STOMACH 1HR BEFORE OR 2HRS AFTER FOOD. 01/18/18  Yes FLloyd Huger MD  ondansetron (ZOFRAN) 8 MG tablet 1 pill every 8 hours as needed.  10/31/17  Yes BCammie Sickle MD  tamsulosin (FLOMAX) 0.4 MG CAPS capsule Take 1 capsule (0.4 mg total) by mouth daily. 02/18/18  Yes PDustin Flock MD  tiotropium (SPIRIVA HANDIHALER) 18 MCG inhalation capsule Place 1 capsule (18 mcg total) into inhaler and inhale daily. 04/09/17 04/09/18 Yes KFlora Lipps MD    Scheduled: .  stroke: mapping our early stages of recovery book   Does not apply Once  . aspirin  325 mg Oral Daily  . atorvastatin  40 mg Oral q1800  . DULoxetine  30 mg Oral Daily  . enoxaparin (LOVENOX) injection  40 mg Subcutaneous Q24H  . fentaNYL  1 patch Transdermal Q48H  . fentaNYL  1 patch Transdermal Q48H  . fluticasone furoate-vilanterol  1 puff Inhalation Daily  . megestrol  20 mg Oral Daily  . tamsulosin  0.4 mg Oral Daily  . tiotropium  18 mcg Inhalation Daily    ROS: Unable to obtain from patient  due to current altered mental status  Physical Examination: Blood pressure 110/74, pulse 92, temperature 98.4 F (36.9 C), temperature source Oral, resp. rate 16, height 6' (1.829 m), weight 61 kg, SpO2 96 %.   HEENT-  Normocephalic, no lesions, without obvious abnormality.  Normal external eye and conjunctiva.  Normal TM's bilaterally.  Normal auditory canals and external ears. Normal external nose, mucus membranes and septum.  Normal pharynx. Cardiovascular- S1, S2 normal, pulses palpable throughout   Lungs- chest clear, no wheezing, rales, normal symmetric air entry Abdomen- soft, non-tender; bowel sounds normal; no masses,  no organomegaly Extremities- no edema Lymph-no adenopathy palpable Musculoskeletal-no joint tenderness, deformity or swelling Skin-warm and dry, no hyperpigmentation, vitiligo, or suspicious lesions  Neurological Exam   Mental Status: Alert, oriented to person but not to place or time, thought content mildly inappropriate.  Able to identify fiancee by name and relationship to him. Speech fluent without evidence of aphasia.  Able to  follow simple commands but with some difficulty. Requires prompting and cueing. Attention span and concentration seemed mildly impaired.  Cranial Nerves: II: Discs flat bilaterally; Visual fields grossly normal, pupils equal, round, reactive to light and accommodation III,IV, VI: ptosis not present, extra-ocular motions intact bilaterally V,VII: smile symmetric, facial light touch sensation intact VIII: hearing normal bilaterally IX,X: gag reflex present XI: bilateral shoulder shrug XII: midline tongue extension Motor: Right :  Upper extremity   5/5 Without pronator drift      Left: Upper extremity   5/5 without pronator drift Right:   Lower extremity   5/5                                          Left: Lower extremity   5/5 Tone and bulk:normal tone throughout; no atrophy noted Sensory: Pinprick and light touch intact bilaterally Deep Tendon Reflexes: 2+ and symmetric throughout Plantars: Right: mute                              Left: mute Cerebellar: Finger-to-nose testing intact bilaterally. Heel to shin testing normal bilaterally Gait: not tested due to safety concerns  Data Reviewed  Laboratory Studies:  Basic Metabolic Panel: Recent Labs  Lab 03/07/18 1620  NA 137  K 3.8  CL 100  CO2 30  GLUCOSE 95  BUN 13  CREATININE 0.89  CALCIUM 9.0    Liver Function Tests: Recent Labs  Lab 03/07/18 1620  AST 14*  ALT 7  ALKPHOS 44  BILITOT 0.8  PROT 6.9  ALBUMIN 3.6   No results for input(s): LIPASE, AMYLASE in the last 168 hours. No results for input(s): AMMONIA in the last 168 hours.  CBC: Recent Labs  Lab 03/07/18 1620  WBC 12.8*  NEUTROABS 10.8*  HGB 10.8*  HCT 34.8*  MCV 89.9  PLT 209    Cardiac Enzymes: No results for input(s): CKTOTAL, CKMB, CKMBINDEX, TROPONINI in the last 168 hours.  BNP: Invalid input(s): POCBNP  CBG: No results for input(s): GLUCAP in the last 168 hours.  Microbiology: Results for orders placed or performed during the  hospital encounter of 02/15/18  Gastrointestinal Panel by PCR , Stool     Status: Abnormal   Collection Time: 02/15/18 11:52 AM  Result Value Ref Range Status   Campylobacter species NOT DETECTED NOT DETECTED Final   Plesimonas shigelloides NOT DETECTED  NOT DETECTED Final   Salmonella species NOT DETECTED NOT DETECTED Final   Yersinia enterocolitica NOT DETECTED NOT DETECTED Final   Vibrio species NOT DETECTED NOT DETECTED Final   Vibrio cholerae NOT DETECTED NOT DETECTED Final   Enteroaggregative E coli (EAEC) NOT DETECTED NOT DETECTED Final   Enteropathogenic E coli (EPEC) NOT DETECTED NOT DETECTED Final   Enterotoxigenic E coli (ETEC) NOT DETECTED NOT DETECTED Final   Shiga like toxin producing E coli (STEC) NOT DETECTED NOT DETECTED Final   Shigella/Enteroinvasive E coli (EIEC) NOT DETECTED NOT DETECTED Final   Cryptosporidium NOT DETECTED NOT DETECTED Final   Cyclospora cayetanensis NOT DETECTED NOT DETECTED Final   Entamoeba histolytica NOT DETECTED NOT DETECTED Final   Giardia lamblia NOT DETECTED NOT DETECTED Final   Adenovirus F40/41 NOT DETECTED NOT DETECTED Final   Astrovirus NOT DETECTED NOT DETECTED Final   Norovirus GI/GII NOT DETECTED NOT DETECTED Final   Rotavirus A NOT DETECTED NOT DETECTED Final   Sapovirus (I, II, IV, and V) DETECTED (A) NOT DETECTED Final    Comment: Performed at [facility], [address] [place], [place] [address]  C difficile quick scan w PCR reflex     Status: None   Collection Time: 02/15/18 11:52 AM  Result Value Ref Range Status   C Diff antigen NEGATIVE NEGATIVE Final   C Diff toxin NEGATIVE NEGATIVE Final   C Diff interpretation No C. difficile detected.  Final    Comment: Performed at [facility], [address] [place], [place] [address]  Blood culture (routine x 2)     Status: None   Collection Time: 02/15/18  2:52 PM  Result Value Ref Range Status   Specimen Description BLOOD BLOOD RIGHT FOREARM  Final    Special Requests   Final    BOTTLES DRAWN AEROBIC AND ANAEROBIC Blood Culture results may not be optimal due to an excessive volume of blood received in culture bottles   Culture   Final    NO GROWTH 5 DAYS Performed at [facility], [address], [place], [place] [address]    Report Status 02/20/2018 FINAL  Final  Blood culture (routine x 2)     Status: None   Collection Time: 02/15/18  2:54 PM  Result Value Ref Range Status   Specimen Description BLOOD BLOOD RIGHT WRIST  Final   Special Requests   Final    BOTTLES DRAWN AEROBIC AND ANAEROBIC Blood Culture adequate volume   Culture   Final    NO GROWTH 5 DAYS Performed at [facility], [address], [place], [place] [address]    Report Status 02/20/2018 FINAL  Final    Coagulation Studies: No results for input(s): LABPROT, INR in the last 72 hours.  Urinalysis:  Recent Labs  Lab 03/07/18 2201  COLORURINE YELLOW*  LABSPEC 1.018  PHURINE 6.0  GLUCOSEU NEGATIVE  HGBUR NEGATIVE  BILIRUBINUR NEGATIVE  KETONESUR NEGATIVE  PROTEINUR NEGATIVE  NITRITE NEGATIVE  LEUKOCYTESUR NEGATIVE    Lipid Panel:    Component Value Date/Time   CHOL 135 05/30/2012 0125   TRIG 73 05/30/2012 0125   HDL 39 (L) 05/30/2012 0125   VLDL 15 05/30/2012 0125   LDLCALC 81 05/30/2012 0125    HgbA1C:  Lab Results  Component Value Date   HGBA1C 5.4 12/23/2014    Urine Drug Screen:      Component Value Date/Time   LABOPIA POSITIVE (A) 03/07/2018 2201   COCAINSCRNUR NONE DETECTED 03/07/2018 2201  LABBENZ POSITIVE (A) 03/07/2018 2201   AMPHETMU NONE DETECTED 03/07/2018 2201   THCU POSITIVE (A) 03/07/2018 2201   LABBARB NONE DETECTED 03/07/2018 2201    Alcohol Level: No results for input(s): ETH in the last 168 hours.  Other results: EKG: there are no previous tracings available for comparison.  Imaging: Ct Head Wo Contrast  Result Date: 03/08/2018 CLINICAL DATA:  Altered mental status with hallucinations and  agitation EXAM: CT HEAD WITHOUT CONTRAST TECHNIQUE: Contiguous axial images were obtained from the base of the skull through the vertex without intravenous contrast. COMPARISON:  January 03, 2018 FINDINGS: Brain: The ventricles are normal in size and configuration. There is no demonstrable mass, hemorrhage, extra-axial fluid collection, or midline shift. There is a focal area of decreased attenuation in the periphery of the posterior mid right frontal lobe, concerning for potential early infarct. Elsewhere brain parenchyma appears unremarkable. Vascular: No appreciable hyperdense vessel. There is calcification in each cavernous carotid artery region. Skull: The bony calvarium appears intact. Sinuses/Orbits: There is mucosal thickening in several ethmoid air cells. There is a retention cyst in the inferior right maxillary antrum. Orbits appear symmetric bilaterally. Other: Mastoid air cells are clear. IMPRESSION: Decreased attenuation in the posterior mid right frontal lobe, best appreciated on axial slice 24 series 7. Question early infarct in this area. Brain parenchyma elsewhere appears unremarkable. No mass or hemorrhage. There are foci of arterial vascular calcification. There are areas of paranasal sinus disease. Electronically Signed   By: Lowella Grip III M.D.   On: 03/08/2018 07:38   Assessment: 58 y.o. male ith past medical history of multiple myeloma, myocardial infarction, COPD on chronic oxygen, CHF, polysubstance abuse, hyperlipidemia, CAD, defibrillator placement, and hyperthyroidism presenting to the ED on 03/07/2018 under IVC for altered mental status associated with behavioral disturbances. A non-contrast head CT showed decreased attenuation in the posterior mid right frontal lobe questionable infarct in this area which maybe contributing to change in mental status. However cannot rule out other etiology given hx of multiple myeloma.  Unable to obtain MRI brain due to presence of  defibrillator. Patient was on Aspirin 81 mg prior to this event. Further work up pending.  Stroke Risk Factors - carotid stenosis, family history, hyperlipidemia, hypertension and smoking  Plan: 1. HgbA1c, fasting lipid panel pending 2. Check labs:  RPR, thyroid-stimulating hormone (TSH) -reflex, lyme reflex, thiamine, Ammonia, B12 3. PT consult, OT consult, Speech consult 4. Echocardiogram 5. Repeat CT head with contrast in the morning 6. Start dual therapy Aspirin 81 mg/day and Plavix 75 mg /day with intensive management of vascular risk factor to keep systolic BP (SBP) <308 mm Hg (130 mm Hg if diabetic) 7. NPO until RN stroke swallow screen 8. Telemetry monitoring 9. Frequent neuro checks  This patient was staffed with Dr. Irish Elders, Alease Frame who personally evaluated patient, reviewed documentation and agreed with assessment and plan of care as above.  Rufina Falco, DNP, FNP-BC Board certified Nurse Practitioner Neurology Department   03/08/2018, 2:08 PM

## 2018-03-08 NOTE — Progress Notes (Signed)
Patient insistent on ambulating to restroom,  HR up to 180's, BP 96/67, 02 75% on RA, assisted back to bed, 3L 02 reapplied, sats at 91%, HR at 150. Notified Dr. Jerelyn Charles, MEWS score at 4. Received verbal order for haldol 5mg  IM now, then Q6 PRN, and to transfer to step down, 2A.

## 2018-03-08 NOTE — ED Notes (Signed)
ED Is the patient under IVC or is there intent for IVC: Yes.   Is the patient medically cleared: pt wears oxygen at times  Is there vacancy in the ED BHU: Yes.   Is the population mix appropriate for patient: Is the patient awaiting placement in inpatient or outpatient setting: Yes.   Has the patient had a psychiatric consult:  Seen by NP  Awaiting psychiatrist  Survey of unit performed for contraband, proper placement and condition of furniture, tampering with fixtures in bathroom, shower, and each patient room: Yes.  ; Findings:  APPEARANCE/BEHAVIOR Calm and cooperative NEURO ASSESSMENT Orientation: oriented x3  Denies pain Hallucinations: No.None noted (Hallucinations) denies Speech: Normal Gait: normal - unsteady at times  RESPIRATORY ASSESSMENT Even  Unlabored respirations  CARDIOVASCULAR ASSESSMENT Pulses equal   regular rate  Skin warm and dry   GASTROINTESTINAL ASSESSMENT no GI complaint EXTREMITIES Full ROM  PLAN OF CARE Provide calm/safe environment. Vital signs assessed twice daily. ED BHU Assessment once each 12-hour shift. Collaborate with TTS daily or as condition indicates. Assure the ED provider has rounded once each shift. Provide and encourage hygiene. Provide redirection as needed. Assess for escalating behavior; address immediately and inform ED provider.  Assess family dynamic and appropriateness for visitation as needed: Yes.  ; If necessary, describe findings:  Educate the patient/family about BHU procedures/visitation: Yes.  ; If necessary, describe findings:

## 2018-03-08 NOTE — Consult Note (Signed)
**Note [name]-Identified via Obfuscation** [facility] Consult   Reason for Consult:  Altered Mental Status Referring Physician:  Dr. Quentin Cornwall Patient Identification: [name] MRN:  [id number] Principal Diagnosis: Altered mental status, unspecified Diagnosis:  Principal Problem:   Altered mental status, unspecified   Total Time spent with patient: 45 minutes  Subjective:  " I don't know what happen the police said I should come with them." [name] is a 58 y.o. male patient presented to [facility] ED under involuntary commitment (IVC). Due to an verbal altercation the patient had with his girlfriend and her sister.  Consulted with Dr. Quentin Cornwall (ED provider): The patient was seen fact-to-face by this provider; chart reviewed and consulted with Dr. Quentin Cornwall on 03/07/2018 about the plan of care for the patient. It was discussed that the patient does not meet criteria for psychiatric admission. The patient is presenting with some alter mental status behavior which could be cause by his medical diagnosis.  On evaluation [name] is alert and oriented x 2-3, calm and cooperative, and mood-congruent with affect. The patient does not appear to be responding to internal or external stimuli. The patient is presenting with some delusional thinking. The patient denies any suicidal, homicidal, or self-harm ideations. The patient is not presenting with any psychotic or paranoid behaviors. During an encounter with the patient, he was able to answer some questions appropriately.  Per Dr. Quentin Cornwall HPI:  [name] is a 58 y.o. male the below listed past medical history presents the ER via EMS under IVC for hallucination and reported agitation and aggressive behavior towards family members.  Patient does seem quite disorganized on exam.  He is unable to provide much of a history as to why he was brought to the ER.  Very difficult to follow.  Will moment he is talking about his "fiance going out and get another baby "quickly followed  by "they were trying to move my for bed double wide with a pickup truck in the rain." Collaboration with girlfriend [name] [phone]): The patient girlfriend voiced that the patient began to "act strangely around christmas which lasted for 2 days." She stated that she was awaken and noticed the patient trying to break the lamb because he believed it was on fire. The patient girlfriend stated that she does not believe his behavior is mental, but more medical. He has been on Chemo for 4 years and the patient is depended on oxygen daily.  Past Psychiatric History: None disclose   Risk to Self: Suicidal Ideation: No Suicidal Intent: No Is patient at risk for suicide?: No Suicidal Plan?: No Access to Means: No What has been your use of drugs/alcohol within the last 12 months?: denies(denies) How many times?: 0 Other Self Harm Risks: denies Triggers for Past Attempts: None known Intentional Self Injurious Behavior: None Risk to Others: Homicidal Ideation: No Thoughts of Harm to Others: No Current Homicidal Intent: No Current Homicidal Plan: No Access to Homicidal Means: No History of harm to others?: No Assessment of Violence: None Noted Does patient have access to weapons?: No Criminal Charges Pending?: No Does patient have a court date: No Prior Inpatient Therapy: Prior Inpatient Therapy: No Prior Therapy Dates: 1980 Prior Therapy Facilty/Provider(s): [facility] Reason for Treatment: Substance Use Treatment Prior Outpatient Therapy: Prior Outpatient Therapy: No Does patient have an ACCT team?: No Does patient have Intensive In-House Services?  : No Does patient have [facility] services? : No Does patient have P4CC services?: No  Past Medical History:  Past  Medical History:  Diagnosis Date  . Anxiety   . Arthritis   . Atrial septal defect   . CHF (congestive heart failure) ([place])   . COPD (chronic obstructive pulmonary disease) ([place])   . Coronary artery disease   .  Dysrhythmias   . GERD (gastroesophageal reflux disease)   . Hyperlipidemia   . Multiple myeloma ([place])   . Multiple myeloma ([place])   . Myocardial infarction ([place])   . Substance abuse [facility])     Past Surgical History:  Procedure Laterality Date  . CARDIAC DEFIBRILLATOR PLACEMENT     Family History:  Family History  Problem Relation Age of Onset  . COPD Mother   . CAD Mother   . Heart attack Father   . Prostate cancer Maternal Grandfather   . Kidney cancer Neg Hx   . Bladder Cancer Neg Hx    Family Psychiatric  History: None given Social History:  Social History   Substance and Sexual Activity  Alcohol Use No  . Alcohol/week: 0.0 standard drinks     Social History   Substance and Sexual Activity  Drug Use No    Social History   Socioeconomic History  . Marital status: Divorced    Spouse name: Not on file  . Number of children: Not on file  . Years of education: Not on file  . Highest education level: Not on file  Occupational History  . Not on file  Social Needs  . Financial resource strain: Not on file  . Food insecurity:    Worry: Not on file    Inability: Not on file  . Transportation needs:    Medical: Not on file    Non-medical: Not on file  Tobacco Use  . Smoking status: Current Every Day Smoker    Packs/day: 0.50    Years: 40.00    Pack years: 20.00    Types: Cigarettes  . Smokeless tobacco: Never Used  Substance and Sexual Activity  . Alcohol use: No    Alcohol/week: 0.0 standard drinks  . Drug use: No  . Sexual activity: Not on file  Lifestyle  . Physical activity:    Days per week: Not on file    Minutes per session: Not on file  . Stress: Not on file  Relationships  . Social connections:    Talks on phone: Not on file    Gets together: Not on file    Attends religious service: Not on file    Active member of club or organization: Not on file    Attends meetings of clubs or organizations: Not on file    Relationship status: Not on file   Other Topics Concern  . Not on file  Social History Narrative  . Not on file   Additional Social History:    Allergies:  No Known Allergies  Labs:  Results for orders placed or performed during the hospital encounter of 03/07/18 (from the past 48 hour(s))  CBC with Differential/Platelet     Status: Abnormal   Collection Time: 03/07/18  4:20 PM  Result Value Ref Range   WBC 12.8 (H) 4.0 - 10.5 K/uL   RBC 3.87 (L) 4.22 - 5.81 MIL/uL   Hemoglobin 10.8 (L) 13.0 - 17.0 g/dL   HCT 34.8 (L) 39.0 - 52.0 %   MCV 89.9 80.0 - 100.0 fL   MCH 27.9 26.0 - 34.0 pg   MCHC 31.0 30.0 - 36.0 g/dL   RDW 14.7 11.5 - 15.5 %  Platelets 209 150 - 400 K/uL   nRBC 0.0 0.0 - 0.2 %   Neutrophils Relative % 84 %   Neutro Abs 10.8 (H) 1.7 - 7.7 K/uL   Lymphocytes Relative 6 %   Lymphs Abs 0.7 0.7 - 4.0 K/uL   Monocytes Relative 8 %   Monocytes Absolute 1.1 (H) 0.1 - 1.0 K/uL   Eosinophils Relative 1 %   Eosinophils Absolute 0.1 0.0 - 0.5 K/uL   Basophils Relative 0 %   Basophils Absolute 0.0 0.0 - 0.1 K/uL   Immature Granulocytes 1 %   Abs Immature Granulocytes 0.07 0.00 - 0.07 K/uL    Comment: Performed at [facility], [address] [place], [place] [address]  Comprehensive metabolic panel     Status: Abnormal   Collection Time: 03/07/18  4:20 PM  Result Value Ref Range   Sodium 137 135 - 145 mmol/L   Potassium 3.8 3.5 - 5.1 mmol/L   Chloride 100 98 - 111 mmol/L   CO2 30 22 - 32 mmol/L   Glucose, Bld 95 70 - 99 mg/dL   BUN 13 6 - 20 mg/dL   Creatinine, Ser 0.89 0.61 - 1.24 mg/dL   Calcium 9.0 8.9 - 10.3 mg/dL   Total Protein 6.9 6.5 - 8.1 g/dL   Albumin 3.6 3.5 - 5.0 g/dL   AST 14 (L) 15 - 41 U/L   ALT 7 0 - 44 U/L   Alkaline Phosphatase 44 38 - 126 U/L   Total Bilirubin 0.8 0.3 - 1.2 mg/dL   GFR calc non Af Amer >60 >60 mL/min   GFR calc Af Amer >60 >60 mL/min   Anion gap 7 5 - 15    Comment: Performed at [facility], [address] [place], [place] [address]   Blood gas, venous     Status: Abnormal   Collection Time: 03/07/18  4:20 PM  Result Value Ref Range   pH, Ven 7.35 7.250 - 7.430   pCO2, Ven 54 44.0 - 60.0 mmHg   pO2, Ven 34.0 32.0 - 45.0 mmHg   Bicarbonate 29.8 (H) 20.0 - 28.0 mmol/L   Acid-Base Excess 2.9 (H) 0.0 - 2.0 mmol/L   O2 Saturation 61.8 %   Patient temperature 37.0    Collection site VENOUS    Sample type VENOUS     Comment: Performed at [facility], [address] [place], [place] [address]  Urine Drug Screen, Qualitative ([facility] only)     Status: Abnormal   Collection Time: 03/07/18 10:01 PM  Result Value Ref Range   Tricyclic, Ur Screen NONE DETECTED NONE DETECTED   Amphetamines, Ur Screen NONE DETECTED NONE DETECTED   MDMA (Ecstasy)Ur Screen NONE DETECTED NONE DETECTED   Cocaine Metabolite,Ur [place] NONE DETECTED NONE DETECTED   Opiate, Ur Screen POSITIVE (A) NONE DETECTED   Phencyclidine (PCP) Ur S NONE DETECTED NONE DETECTED   Cannabinoid 50 Ng, Ur [place] POSITIVE (A) NONE DETECTED   Barbiturates, Ur Screen NONE DETECTED NONE DETECTED   Benzodiazepine, Ur Scrn POSITIVE (A) NONE DETECTED   Methadone Scn, Ur NONE DETECTED NONE DETECTED    Comment: (NOTE) Tricyclics + metabolites, urine    Cutoff 1000 ng/mL Amphetamines + metabolites, urine  Cutoff 1000 ng/mL MDMA (Ecstasy), urine              Cutoff 500 ng/mL Cocaine Metabolite, urine          Cutoff 300 ng/mL Opiate + metabolites, urine  Cutoff 300 ng/mL Phencyclidine (PCP), urine         Cutoff 25 ng/mL Cannabinoid, urine                 Cutoff 50 ng/mL Barbiturates + metabolites, urine  Cutoff 200 ng/mL Benzodiazepine, urine              Cutoff 200 ng/mL Methadone, urine                   Cutoff 300 ng/mL The urine drug screen provides only a preliminary, unconfirmed analytical test result and should not be used for non-medical purposes. Clinical consideration and professional judgment should be applied to any positive drug screen result due to  possible interfering substances. A more specific alternate chemical method must be used in order to obtain a confirmed analytical result. Gas chromatography / mass spectrometry (GC/MS) is the preferred confirmat ory method. Performed at [facility], [address] [place], [place] [address]   Urinalysis, Complete w Microscopic     Status: Abnormal   Collection Time: 03/07/18 10:01 PM  Result Value Ref Range   Color, Urine YELLOW (A) YELLOW   APPearance CLEAR (A) CLEAR   Specific Gravity, Urine 1.018 1.005 - 1.030   pH 6.0 5.0 - 8.0   Glucose, UA NEGATIVE NEGATIVE mg/dL   Hgb urine dipstick NEGATIVE NEGATIVE   Bilirubin Urine NEGATIVE NEGATIVE   Ketones, ur NEGATIVE NEGATIVE mg/dL   Protein, ur NEGATIVE NEGATIVE mg/dL   Nitrite NEGATIVE NEGATIVE   Leukocytes,Ua NEGATIVE NEGATIVE   RBC / HPF 0-5 0 - 5 RBC/hpf   WBC, UA 0-5 0 - 5 WBC/hpf   Bacteria, UA NONE SEEN NONE SEEN   Squamous Epithelial / LPF NONE SEEN 0 - 5   Mucus PRESENT     Comment: Performed at [facility], [address] [place],  [address]    Current Facility-Administered Medications  Medication Dose Route Frequency Provider Last Rate Last Dose  . acetaminophen (TYLENOL) tablet 650 mg  650 mg Oral Q6H PRN Merlyn Lot, MD      . albuterol (PROVENTIL) (2.5 MG/3ML) 0.083% nebulizer solution 2.5 mg  2.5 mg Inhalation Q6H PRN Merlyn Lot, MD      . ALPRAZolam Duanne Moron) tablet 0.5 mg  0.5 mg Oral TID PRN Merlyn Lot, MD      . aspirin EC tablet 325 mg  325 mg Oral Daily Merlyn Lot, MD   325 mg at 03/07/18 1825  . fentaNYL (DURAGESIC) 100 MCG/HR 1 patch  1 patch Transdermal Q48H Merlyn Lot, MD   1 patch at 03/07/18 1826  . fentaNYL (DURAGESIC) 25 MCG/HR 1 patch  1 patch Transdermal Q48H Merlyn Lot, MD   1 patch at 03/07/18 1827  . fluticasone furoate-vilanterol (BREO ELLIPTA) 200-25 MCG/INH 1 puff  1 puff Inhalation Daily Merlyn Lot, MD   1 puff at  03/07/18 1826  . ipratropium-albuterol (DUONEB) 0.5-2.5 (3) MG/3ML nebulizer solution 3 mL  3 mL Nebulization Q4H PRN Merlyn Lot, MD       Current Outpatient Medications  Medication Sig Dispense Refill  . acetaminophen (TYLENOL) 325 MG tablet Take 2 tablets (650 mg total) by mouth every 6 (six) hours as needed for mild pain (or Fever >/= 101).    Marland Kitchen albuterol (PROVENTIL HFA;VENTOLIN HFA) 108 (90 Base) MCG/ACT inhaler Inhale 2 puffs into the lungs every 6 (six) hours as needed for wheezing or shortness of breath. 1 Inhaler 6  . ALPRAZolam (XANAX) 0.5 MG  tablet Take 1 tablet (0.5 mg total) by mouth 3 (three) times daily as needed for anxiety. 90 tablet 1  . aspirin 325 MG EC tablet Take 325 mg by mouth daily.    Marland Kitchen atorvastatin (LIPITOR) 20 MG tablet Take 20 mg by mouth daily.  2  . dexamethasone (DECADRON) 4 MG tablet TAKE 3 TABLETS (12 MG TOTAL) BY MOUTH ONCE A WEEK. TAKE WITH BREAKFAST 12 tablet 4  . DULoxetine (CYMBALTA) 30 MG capsule TAKE ONE CAPSULE BY MOUTH EVERY DAY 90 capsule 1  . fentaNYL (DURAGESIC) 100 MCG/HR Place 1 patch onto the skin every other day. Use with a 25 mcg patch for total dose of 125 mcg 15 patch 0  . fentaNYL (DURAGESIC) 25 MCG/HR Place 1 patch onto the skin every other day. Use with a 100 mcg patch for total dose of 125 mcg 15 patch 0  . Fluticasone-Salmeterol (ADVAIR DISKUS) 250-50 MCG/DOSE AEPB Inhale 1 puff into the lungs 2 (two) times daily. 3 each 2  . gabapentin (NEURONTIN) 100 MG capsule Take 2 capsules (200 mg total) by mouth 3 (three) times daily. 180 capsule 3  . HYDROmorphone (DILAUDID) 2 MG tablet Take 1 tablet (2 mg total) by mouth every 6 (six) hours as needed for severe pain. 56 tablet 0  . ipratropium-albuterol (DUONEB) 0.5-2.5 (3) MG/3ML SOLN Take 3 mLs by nebulization every 4 (four) hours as needed. 360 mL 3  . megestrol (MEGACE) 20 MG tablet TAKE ONE TABLET BY MOUTH ONCE DAILY 30 tablet 2  . metoprolol tartrate (LOPRESSOR) 50 MG tablet Take 50 mg  by mouth 2 (two) times daily.  2  . naloxone (NARCAN) nasal spray 4 mg/0.1 mL For Opioid Overdose: Call 911 and administer a single spray of Narcan in one nostril. Repeat every 74mns as needed if no or minimal response 1 kit 2  . NINLARO 4 MG capsule TAKE 1 CAPSULE (4 MG TOTAL) BY MOUTH ONCE A WEEK. TAKE FOR 3 WEEKS ON, THEN 1 WEEK OFF. TAKE ON EMPTY STOMACH 1HR BEFORE OR 2HRS AFTER FOOD. 3 capsule 4  . ondansetron (ZOFRAN) 8 MG tablet 1 pill every 8 hours as needed. 40 tablet 0  . tamsulosin (FLOMAX) 0.4 MG CAPS capsule Take 1 capsule (0.4 mg total) by mouth daily. 30 capsule 0  . tiotropium (SPIRIVA HANDIHALER) 18 MCG inhalation capsule Place 1 capsule (18 mcg total) into inhaler and inhale daily. 30 capsule 6    Musculoskeletal: Strength & Muscle Tone: decreased Gait & Station: unsteady Patient leans: N/A  Psychiatric Specialty Exam: Physical Exam  Eyes: Pupils are equal, round, and reactive to light. Conjunctivae and EOM are normal.  Neck: Normal range of motion. Neck supple.  Musculoskeletal: Normal range of motion.  Neurological: He is alert.    Review of Systems  Constitutional: Positive for malaise/fatigue and weight loss.  HENT: Negative.   Respiratory: Positive for shortness of breath.   Musculoskeletal: Positive for falls.  Psychiatric/Behavioral: Positive for hallucinations and memory loss. Negative for depression, substance abuse and suicidal ideas. The patient is not nervous/anxious and does not have insomnia.     Blood pressure 104/69, pulse (!) 103, temperature 98.7 F (37.1 C), temperature source Oral, resp. rate 16, height 6' (1.829 m), weight 61 kg, SpO2 92 %.Body mass index is 18.24 kg/m.  General Appearance: Disheveled  Eye Contact:  Good  Speech:  Garbled  Volume:  Normal  Mood:  NA  Affect:  Inappropriate  Thought Process:  Disorganized  Orientation:  Full (Time,  Place, and Person)  Thought Content:  Delusions  Suicidal Thoughts:  No  Homicidal Thoughts:   No  Memory:  Recent;   Poor  Judgement:  Impaired  Insight:  Lacking  Psychomotor Activity:  Decreased  Concentration:  Concentration: Poor  Recall:  Poor  Fund of Knowledge:  Poor  Language:  Fair  Akathisia:  NA  Handed:  Right  AIMS (if indicated):     Assets:  Physical Health  ADL's:  Impaired  Cognition:  Impaired,  Mild  Sleep:        Treatment Plan Summary: Plan Patient does not meet criteria for inpatient admission  Disposition: Patient does not meet criteria for psychiatric inpatient admission.  Lamont Dowdy, NP 03/08/2018 3:34 AM

## 2018-03-08 NOTE — ED Notes (Signed)
MRI cancelled due to pt has inplanted defibrillator

## 2018-03-08 NOTE — Progress Notes (Signed)
Dr Darvin Neighbours aware that pulse 136-138, BP 90/70, new orders to be placed

## 2018-03-08 NOTE — ED Notes (Signed)

## 2018-03-08 NOTE — ED Notes (Addendum)
Pt to CT with CT staff, security and rover pt transferred on 2L O2  (chronic)

## 2018-03-08 NOTE — ED Notes (Signed)
He has returned from [place] him back into bed - warm blanket provided  He denies pain  2L O2 in place    Plan of care discussed     NP note states does not meet criteria - IVC remains in place  EDP ordered psychiatry consult   No verbalized needs or concerns at this time

## 2018-03-08 NOTE — ED Notes (Signed)
BEHAVIORAL HEALTH ROUNDING Patient sleeping: No. Patient alert and oriented: yes Behavior appropriate: Yes.  ; If no, describe:  Nutrition and fluids offered: yes Toileting and hygiene offered: Yes  Sitter present: q15 minute observations and security  monitoring Law enforcement present: Yes  ODS  

## 2018-03-08 NOTE — ED Notes (Signed)
Pts family member is at bedside - she reports that the pt wears 3L O2 chronically  - setting changed at this time

## 2018-03-08 NOTE — ED Notes (Signed)
Patient observed lying in bed with eyes closed  Even, unlabored respirations observed   NAD pt appears to be sleeping  I will continue to monitor along with every 15 minute visual observations and ongoing security monitoring    

## 2018-03-08 NOTE — ED Notes (Addendum)
MD Clapacs is currently at his bedside  - Clapacs reports seeing CT results and they are concerning for acute CVA  - I informed charge RN  - Clapacs informed EDP

## 2018-03-08 NOTE — Consult Note (Signed)
[facility] Face-to-Face Psychiatry Consult   Reason for Consult: Consult in the emergency room for this 58 year old man with significant medical problems who presented to the emergency room because of altered mental status Referring Physician: Siadecki Patient Identification: [name] MRN:  [id number] Principal Diagnosis: Altered mental status, unspecified Diagnosis:  Principal Problem:   Altered mental status, unspecified Active Problems:   CVA (cerebral vascular accident) ([place])   Total Time spent with patient: 1 hour  Subjective:   [name] is a 58 y.o. male patient admitted with "I just do not understand".  HPI: Patient seen chart reviewed.  58 year old man with COPD and multiple myeloma and chronic pain brought to the emergency room because girlfriend noticed he has had altered mental status.  Appears to have been an acute change within the last day.  Patient became irritable and impulsive and his behavior was different than usual.  Mood apparently was labile and he appeared confused.  No evidence of any drug abuse that she reported.  Patient interviewed.  He was awake and alert but clearly confused.  Did not know where he was.  Told me he thought he was at [facility].  Could not even repeat 3 words immediately much less remember any of them after a few minutes.  Patient abruptly started crying during the interview.  When I ask him why he was crying he said that he worried about the answers to the questions he was giving me.  After I reassured him he abruptly stopped crying and did not do it again.  Patient denies that he has been abusing any drugs.  Looking back in the chart I do mention and this may have nothing to do with the situation but that he had recently had worsening of his pain and there was a possibility of taking some extra Dilaudid.  Patient has not been violent or aggressive or made any suicidal statements.  Social history: Lives at home.  He tells me is with his mother.   Chart makes it sound like it may be more with his girlfriend.  Medical history: Patient has multiple myeloma.  Significant bone lesions.  Significant pain.  Also COPD.  Substance abuse history: Nothing described in the chart.  Past Psychiatric History: No known past psychiatric history.  Patient denies ever seeing a mental health provider in the past.  Denies any suicidality.  Nothing in the chart to suggest any significant mental health problems other than anxiety related to pain and illness  Risk to Self: Suicidal Ideation: No Suicidal Intent: No Is patient at risk for suicide?: No Suicidal Plan?: No Access to Means: No What has been your use of drugs/alcohol within the last 12 months?: denies(denies) How many times?: 0 Other Self Harm Risks: denies Triggers for Past Attempts: None known Intentional Self Injurious Behavior: None Risk to Others: Homicidal Ideation: No Thoughts of Harm to Others: No Current Homicidal Intent: No Current Homicidal Plan: No Access to Homicidal Means: No History of harm to others?: No Assessment of Violence: None Noted Does patient have access to weapons?: No Criminal Charges Pending?: No Does patient have a court date: No Prior Inpatient Therapy: Prior Inpatient Therapy: No Prior Therapy Dates: 1980 Prior Therapy Facilty/Provider(s): [facility] Reason for Treatment: Substance Use Treatment Prior Outpatient Therapy: Prior Outpatient Therapy: No Does patient have an ACCT team?: No Does patient have Intensive In-House Services?  : No Does patient have [facility] services? : No Does patient have P4CC services?: No  Past Medical  History:  Past Medical History:  Diagnosis Date  . Anxiety   . Arthritis   . Atrial septal defect   . CHF (congestive heart failure) ([place])   . COPD (chronic obstructive pulmonary disease) ([place])   . Coronary artery disease   . Dysrhythmias   . GERD (gastroesophageal reflux disease)   . Hyperlipidemia   . Multiple  myeloma ([place])   . Multiple myeloma ([place])   . Myocardial infarction ([place])   . Substance abuse [facility])     Past Surgical History:  Procedure Laterality Date  . CARDIAC DEFIBRILLATOR PLACEMENT     Family History:  Family History  Problem Relation Age of Onset  . COPD Mother   . CAD Mother   . Heart attack Father   . Prostate cancer Maternal Grandfather   . Kidney cancer Neg Hx   . Bladder Cancer Neg Hx    Family Psychiatric  History: Nothing known Social History:  Social History   Substance and Sexual Activity  Alcohol Use No  . Alcohol/week: 0.0 standard drinks     Social History   Substance and Sexual Activity  Drug Use No    Social History   Socioeconomic History  . Marital status: Divorced    Spouse name: Not on file  . Number of children: Not on file  . Years of education: Not on file  . Highest education level: Not on file  Occupational History  . Not on file  Social Needs  . Financial resource strain: Not on file  . Food insecurity:    Worry: Not on file    Inability: Not on file  . Transportation needs:    Medical: Not on file    Non-medical: Not on file  Tobacco Use  . Smoking status: Current Every Day Smoker    Packs/day: 0.50    Years: 40.00    Pack years: 20.00    Types: Cigarettes  . Smokeless tobacco: Never Used  Substance and Sexual Activity  . Alcohol use: No    Alcohol/week: 0.0 standard drinks  . Drug use: No  . Sexual activity: Not on file  Lifestyle  . Physical activity:    Days per week: Not on file    Minutes per session: Not on file  . Stress: Not on file  Relationships  . Social connections:    Talks on phone: Not on file    Gets together: Not on file    Attends religious service: Not on file    Active member of club or organization: Not on file    Attends meetings of clubs or organizations: Not on file    Relationship status: Not on file  Other Topics Concern  . Not on file  Social History Narrative  . Not on file    Additional Social History:    Allergies:  No Known Allergies  Labs:  Results for orders placed or performed during the hospital encounter of 03/07/18 (from the past 48 hour(s))  CBC with Differential/Platelet     Status: Abnormal   Collection Time: 03/07/18  4:20 PM  Result Value Ref Range   WBC 12.8 (H) 4.0 - 10.5 K/uL   RBC 3.87 (L) 4.22 - 5.81 MIL/uL   Hemoglobin 10.8 (L) 13.0 - 17.0 g/dL   HCT 34.8 (L) 39.0 - 52.0 %   MCV 89.9 80.0 - 100.0 fL   MCH 27.9 26.0 - 34.0 pg   MCHC 31.0 30.0 - 36.0 g/dL   RDW 14.7 11.5 -  15.5 %   Platelets 209 150 - 400 K/uL   nRBC 0.0 0.0 - 0.2 %   Neutrophils Relative % 84 %   Neutro Abs 10.8 (H) 1.7 - 7.7 K/uL   Lymphocytes Relative 6 %   Lymphs Abs 0.7 0.7 - 4.0 K/uL   Monocytes Relative 8 %   Monocytes Absolute 1.1 (H) 0.1 - 1.0 K/uL   Eosinophils Relative 1 %   Eosinophils Absolute 0.1 0.0 - 0.5 K/uL   Basophils Relative 0 %   Basophils Absolute 0.0 0.0 - 0.1 K/uL   Immature Granulocytes 1 %   Abs Immature Granulocytes 0.07 0.00 - 0.07 K/uL    Comment: Performed at [facility], [address] [place], [place] [address]  Comprehensive metabolic panel     Status: Abnormal   Collection Time: 03/07/18  4:20 PM  Result Value Ref Range   Sodium 137 135 - 145 mmol/L   Potassium 3.8 3.5 - 5.1 mmol/L   Chloride 100 98 - 111 mmol/L   CO2 30 22 - 32 mmol/L   Glucose, Bld 95 70 - 99 mg/dL   BUN 13 6 - 20 mg/dL   Creatinine, Ser 0.89 0.61 - 1.24 mg/dL   Calcium 9.0 8.9 - 10.3 mg/dL   Total Protein 6.9 6.5 - 8.1 g/dL   Albumin 3.6 3.5 - 5.0 g/dL   AST 14 (L) 15 - 41 U/L   ALT 7 0 - 44 U/L   Alkaline Phosphatase 44 38 - 126 U/L   Total Bilirubin 0.8 0.3 - 1.2 mg/dL   GFR calc non Af Amer >60 >60 mL/min   GFR calc Af Amer >60 >60 mL/min   Anion gap 7 5 - 15    Comment: Performed at [facility], [address] [place], [place] [address]  Blood gas, venous     Status: Abnormal   Collection Time: 03/07/18  4:20 PM   Result Value Ref Range   pH, Ven 7.35 7.250 - 7.430   pCO2, Ven 54 44.0 - 60.0 mmHg   pO2, Ven 34.0 32.0 - 45.0 mmHg   Bicarbonate 29.8 (H) 20.0 - 28.0 mmol/L   Acid-Base Excess 2.9 (H) 0.0 - 2.0 mmol/L   O2 Saturation 61.8 %   Patient temperature 37.0    Collection site VENOUS    Sample type VENOUS     Comment: Performed at [facility], [address] [place], [place] [address]  Urine Drug Screen, Qualitative ([facility] only)     Status: Abnormal   Collection Time: 03/07/18 10:01 PM  Result Value Ref Range   Tricyclic, Ur Screen NONE DETECTED NONE DETECTED   Amphetamines, Ur Screen NONE DETECTED NONE DETECTED   MDMA (Ecstasy)Ur Screen NONE DETECTED NONE DETECTED   Cocaine Metabolite,Ur [place] NONE DETECTED NONE DETECTED   Opiate, Ur Screen POSITIVE (A) NONE DETECTED   Phencyclidine (PCP) Ur S NONE DETECTED NONE DETECTED   Cannabinoid 50 Ng, Ur [place] POSITIVE (A) NONE DETECTED   Barbiturates, Ur Screen NONE DETECTED NONE DETECTED   Benzodiazepine, Ur Scrn POSITIVE (A) NONE DETECTED   Methadone Scn, Ur NONE DETECTED NONE DETECTED    Comment: (NOTE) Tricyclics + metabolites, urine    Cutoff 1000 ng/mL Amphetamines + metabolites, urine  Cutoff 1000 ng/mL MDMA (Ecstasy), urine              Cutoff 500 ng/mL Cocaine Metabolite, urine          Cutoff 300 ng/mL Opiate + metabolites, urine  Cutoff 300 ng/mL Phencyclidine (PCP), urine         Cutoff 25 ng/mL Cannabinoid, urine                 Cutoff 50 ng/mL Barbiturates + metabolites, urine  Cutoff 200 ng/mL Benzodiazepine, urine              Cutoff 200 ng/mL Methadone, urine                   Cutoff 300 ng/mL The urine drug screen provides only a preliminary, unconfirmed analytical test result and should not be used for non-medical purposes. Clinical consideration and professional judgment should be applied to any positive drug screen result due to possible interfering substances. A more specific alternate chemical method must  be used in order to obtain a confirmed analytical result. Gas chromatography / mass spectrometry (GC/MS) is the preferred confirmat ory method. Performed at [facility], [address] [place], [place] [address]   Urinalysis, Complete w Microscopic     Status: Abnormal   Collection Time: 03/07/18 10:01 PM  Result Value Ref Range   Color, Urine YELLOW (A) YELLOW   APPearance CLEAR (A) CLEAR   Specific Gravity, Urine 1.018 1.005 - 1.030   pH 6.0 5.0 - 8.0   Glucose, UA NEGATIVE NEGATIVE mg/dL   Hgb urine dipstick NEGATIVE NEGATIVE   Bilirubin Urine NEGATIVE NEGATIVE   Ketones, ur NEGATIVE NEGATIVE mg/dL   Protein, ur NEGATIVE NEGATIVE mg/dL   Nitrite NEGATIVE NEGATIVE   Leukocytes,Ua NEGATIVE NEGATIVE   RBC / HPF 0-5 0 - 5 RBC/hpf   WBC, UA 0-5 0 - 5 WBC/hpf   Bacteria, UA NONE SEEN NONE SEEN   Squamous Epithelial / LPF NONE SEEN 0 - 5   Mucus PRESENT     Comment: Performed at [facility], [address], [place], [place] [address]    Current Facility-Administered Medications  Medication Dose Route Frequency Provider Last Rate Last Dose  . acetaminophen (TYLENOL) tablet 650 mg  650 mg Oral Q4H PRN Hillary Bow, MD       Or  . acetaminophen (TYLENOL) solution 650 mg  650 mg Per Tube Q4H PRN Hillary Bow, MD       Or  . acetaminophen (TYLENOL) suppository 650 mg  650 mg Rectal Q4H PRN Sudini, Alveta Heimlich, MD      . acetaminophen (TYLENOL) tablet 650 mg  650 mg Oral Q6H PRN Merlyn Lot, MD      . albuterol (PROVENTIL) (2.5 MG/3ML) 0.083% nebulizer solution 2.5 mg  2.5 mg Inhalation Q6H PRN Merlyn Lot, MD      . ALPRAZolam Duanne Moron) tablet 0.5 mg  0.5 mg Oral TID PRN Merlyn Lot, MD      . aspirin EC tablet 325 mg  325 mg Oral Daily Merlyn Lot, MD   325 mg at 03/07/18 1825  . atorvastatin (LIPITOR) tablet 40 mg  40 mg Oral q1800 Sudini, Srikar, MD      . DULoxetine (CYMBALTA) DR capsule 30 mg  30 mg Oral Daily Hillary Bow, MD   30 mg  at 03/08/18 1516  . enoxaparin (LOVENOX) injection 40 mg  40 mg Subcutaneous Q24H Hillary Bow, MD   40 mg at 03/08/18 1516  . fentaNYL (DURAGESIC) 100 MCG/HR 1 patch  1 patch Transdermal Q48H Merlyn Lot, MD   1 patch at 03/07/18 1826  . fentaNYL (DURAGESIC) 25 MCG/HR 1 patch  1 patch Transdermal Q48H Merlyn Lot, MD   1  patch at 03/07/18 1827  . fluticasone furoate-vilanterol (BREO ELLIPTA) 200-25 MCG/INH 1 puff  1 puff Inhalation Daily Merlyn Lot, MD   1 puff at 03/07/18 1826  . ipratropium-albuterol (DUONEB) 0.5-2.5 (3) MG/3ML nebulizer solution 3 mL  3 mL Nebulization Q4H PRN Merlyn Lot, MD      . megestrol (MEGACE) tablet 20 mg  20 mg Oral Daily Hillary Bow, MD   20 mg at 03/08/18 1515  . senna-docusate (Senokot-S) tablet 1 tablet  1 tablet Oral QHS PRN Sudini, Alveta Heimlich, MD      . tamsulosin (FLOMAX) capsule 0.4 mg  0.4 mg Oral Daily Sudini, Alveta Heimlich, MD   0.4 mg at 03/08/18 1515  . tiotropium (SPIRIVA) inhalation capsule ([facility] use ONLY) 18 mcg  18 mcg Inhalation Daily Hillary Bow, MD   18 mcg at 03/08/18 1516    Musculoskeletal: Strength & Muscle Tone: decreased Gait & Station: normal Patient leans: N/A  Psychiatric Specialty Exam: Physical Exam  Nursing note and vitals reviewed. Constitutional: He appears well-developed.  HENT:  Head: Normocephalic and atraumatic.  Eyes: Pupils are equal, round, and reactive to light. Conjunctivae are normal.  Neck: Normal range of motion.  Cardiovascular: Normal heart sounds.  Respiratory: Effort normal. He has wheezes.  GI: Soft.  Musculoskeletal: Normal range of motion.  Neurological: He is alert.  Skin: Skin is warm and dry.  Psychiatric: His affect is labile. His speech is tangential. He is slowed and withdrawn. Thought content is not paranoid and not delusional. Cognition and memory are impaired. He expresses inappropriate judgment. He expresses no homicidal and no suicidal ideation. He exhibits abnormal  recent memory and abnormal remote memory. He is inattentive.    Review of Systems  Constitutional: Positive for malaise/fatigue and weight loss.  HENT: Negative.   Eyes: Negative.   Respiratory: Negative.   Cardiovascular: Negative.   Gastrointestinal: Negative.   Musculoskeletal: Negative.   Skin: Negative.   Neurological: Negative.   Psychiatric/Behavioral: Positive for memory loss. Negative for depression, hallucinations, substance abuse and suicidal ideas. The patient is nervous/anxious. The patient does not have insomnia.     Blood pressure 91/61, pulse (!) 123, temperature 98.3 F (36.8 C), temperature source Oral, resp. rate 20, height 6' (1.829 m), weight 56.7 kg, SpO2 94 %.Body mass index is 16.95 kg/m.  General Appearance: Disheveled  Eye Contact:  Fair  Speech:  Slow  Volume:  Decreased  Mood:  Anxious  Affect:  Inappropriate and Labile  Thought Process:  Disorganized  Orientation:  Negative  Thought Content:  Illogical and Rumination  Suicidal Thoughts:  No  Homicidal Thoughts:  No  Memory:  Immediate;   Poor Recent;   Poor Remote;   Poor  Judgement:  Impaired  Insight:  Shallow  Psychomotor Activity:  Normal  Concentration:  Concentration: Poor  Recall:  Poor  Fund of Knowledge:  Fair  Language:  Fair  Akathisia:  No  Handed:  Right  AIMS (if indicated):     Assets:  Desire for Improvement Housing Social Support  ADL's:  Impaired  Cognition:  Impaired,  Mild and Moderate  Sleep:        Treatment Plan Summary: Plan 58 year old man with multiple myeloma presented to the hospital with abrupt mental status changes.  No known past psychiatric history.  Under the circumstances most likely this would represent a medical or medication or drug situation.  Review of the work-up shows that he had a brain CT done this morning that showed frontal lobe changes that  could represent a stroke.  Seems to have been read as clearly different than the head CT he had in  December.  This would potentially account for the unpredictable mood lability sudden irritability and confusion.  At this point I am going to sign off of this as it does not appear to be a psychiatric illness.  Discontinue IVC.  Case reviewed with emergency room doctor.  Disposition: No evidence of imminent risk to self or others at present.   Patient does not meet criteria for psychiatric inpatient admission.  Alethia Berthold, MD 03/08/2018 3:19 PM

## 2018-03-08 NOTE — Progress Notes (Signed)
ED nurse Levada Dy to interrogate pacemaker soon, [employer] pac[employer]

## 2018-03-08 NOTE — ED Notes (Signed)
Patient attempted to get out of bed multiple of time. Staff redirect patient to stay in bed. Patient continued to respond to internal stimuli.

## 2018-03-08 NOTE — ED Provider Notes (Signed)
-----------------------------------------   1:08 PM on 03/08/2018 -----------------------------------------  I took over care on this patient at 7 AM from Dr. Owens Shark.  The patient had initially presented with apparent hallucinations, agitation, and aggressive behavior towards family.  He was evaluated by the psychiatry NP overnight and initially cleared psychiatrically, however he had an additional episode of agitation requiring sedation.  Dr. Owens Shark had requested that the patient have an in-person psychiatry evaluation with the MD prior to being cleared for discharge.  Based on discussion with psychiatry, he also ordered a CT head to evaluate for any acute intracranial etiology of the patient's symptoms  CT shows possible early right frontal lobe infarct.  The patient has no focal neurologic symptoms although this finding could explain some of his behavioral issues.  There was no specific time of onset noted when he first arrived, and in any case he is out of the window and not an appropriate candidate for thrombolysis or other acute intervention.  At this time he remains clinically stable.  He is calm but does continue to appear somewhat delirious.  However he did receive sedating medications and it is unclear how much of this is due to the medications.    However, he will need medical admission.  I signed the patient out to the hospitalist Dr. Darvin Neighbours.   Arta Silence, MD 03/08/18 1311

## 2018-03-09 ENCOUNTER — Inpatient Hospital Stay: Admit: 2018-03-09 | Payer: Medicaid Other

## 2018-03-09 ENCOUNTER — Inpatient Hospital Stay: Payer: Medicaid Other

## 2018-03-09 LAB — LIPID PANEL
Cholesterol: 84 mg/dL (ref 0–200)
HDL: 31 mg/dL — AB (ref >40)
LDL Cholesterol: 44 mg/dL (ref 0–99)
Total CHOL/HDL Ratio: 2.7 RATIO
Triglycerides: 47 mg/dL (ref <150)
VLDL: 9 mg/dL (ref 0–40)

## 2018-03-09 LAB — HIV ANTIBODY (ROUTINE TESTING W REFLEX): HIV Screen 4th Generation wRfx: NONREACTIVE

## 2018-03-09 LAB — RPR: RPR Ser Ql: NONREACTIVE

## 2018-03-09 MED ORDER — CYANOCOBALAMIN 1000 MCG/ML IJ SOLN
1000.0000 ug | Freq: Once | INTRAMUSCULAR | Status: AC
  Administered 2018-03-09: 1000 ug via INTRAMUSCULAR
  Filled 2018-03-09: qty 1

## 2018-03-09 MED ORDER — LACTATED RINGERS IV BOLUS
1000.0000 mL | Freq: Once | INTRAVENOUS | Status: AC
  Administered 2018-03-09: 1000 mL via INTRAVENOUS

## 2018-03-09 MED ORDER — IOHEXOL 350 MG/ML SOLN
75.0000 mL | Freq: Once | INTRAVENOUS | Status: AC | PRN
  Administered 2018-03-09: 75 mL via INTRAVENOUS

## 2018-03-09 MED ORDER — NICOTINE 14 MG/24HR TD PT24
14.0000 mg | MEDICATED_PATCH | Freq: Every day | TRANSDERMAL | Status: DC
  Administered 2018-03-09 – 2018-03-13 (×5): 14 mg via TRANSDERMAL
  Filled 2018-03-09 (×5): qty 1

## 2018-03-09 MED ORDER — METOPROLOL TARTRATE 25 MG PO TABS: 25.0000 mg | ORAL_TABLET | Freq: Two times a day (BID) | ORAL | Status: DC

## 2018-03-09 MED ORDER — METOPROLOL TARTRATE 50 MG PO TABS
50.0000 mg | ORAL_TABLET | Freq: Two times a day (BID) | ORAL | Status: DC
  Administered 2018-03-09 – 2018-03-13 (×9): 50 mg via ORAL
  Filled 2018-03-09 (×11): qty 1

## 2018-03-09 NOTE — Consult Note (Signed)
[name] is a 58 y.o. male  [id number]  Primary Cardiologist: Dr. Neoma Laming Reason for Consultation: Tachy arrythmias  HPI: 58yo male with significant medical history of presense of pacemaker, CAD, COPD, CHF, dysrhythmias, and multiple myeloma presented to the ER with gastritis, dehydration, and confusion. He developed SVT and hypotension after he was admitted for treatment and cardiology was consulted. He has not been to our office for follow up since 06/28/2017.  SVT and hypotension resolved with saline bolus and Haldol. Pacemaker was interrogate, no report from interrogation is documented.    Review of Systems: Resting comfortably, no apparent distress, but very difficult to arouse. Opened eyes briefly, but did not respond to questions.   Past Medical History:  Diagnosis Date  . Anxiety   . Arthritis   . Atrial septal defect   . CHF (congestive heart failure) ([place])   . COPD (chronic obstructive pulmonary disease) ([place])   . Coronary artery disease   . Dysrhythmias   . GERD (gastroesophageal reflux disease)   . Hyperlipidemia   . Multiple myeloma ([place])   . Multiple myeloma ([place])   . Myocardial infarction ([place])   . Substance abuse ([place])     Medications Prior to Admission  Medication Sig Dispense Refill  . acetaminophen (TYLENOL) 325 MG tablet Take 2 tablets (650 mg total) by mouth every 6 (six) hours as needed for mild pain (or Fever >/= 101).    Marland Kitchen albuterol (PROVENTIL HFA;VENTOLIN HFA) 108 (90 Base) MCG/ACT inhaler Inhale 2 puffs into the lungs every 6 (six) hours as needed for wheezing or shortness of breath. 1 Inhaler 6  . ALPRAZolam (XANAX) 0.5 MG tablet Take 1 tablet (0.5 mg total) by mouth 3 (three) times daily as needed for anxiety. 90 tablet 1  . aspirin 325 MG EC tablet Take 325 mg by mouth daily.    Marland Kitchen atorvastatin (LIPITOR) 20 MG tablet Take 20 mg by mouth daily.  2  . dexamethasone (DECADRON) 4 MG tablet TAKE 3 TABLETS (12 MG TOTAL) BY MOUTH ONCE A WEEK. TAKE  WITH BREAKFAST 12 tablet 4  . DULoxetine (CYMBALTA) 30 MG capsule TAKE ONE CAPSULE BY MOUTH EVERY DAY 90 capsule 1  . fentaNYL (DURAGESIC) 100 MCG/HR Place 1 patch onto the skin every other day. Use with a 25 mcg patch for total dose of 125 mcg 15 patch 0  . fentaNYL (DURAGESIC) 25 MCG/HR Place 1 patch onto the skin every other day. Use with a 100 mcg patch for total dose of 125 mcg 15 patch 0  . Fluticasone-Salmeterol (ADVAIR DISKUS) 250-50 MCG/DOSE AEPB Inhale 1 puff into the lungs 2 (two) times daily. 3 each 2  . gabapentin (NEURONTIN) 100 MG capsule Take 2 capsules (200 mg total) by mouth 3 (three) times daily. 180 capsule 3  . HYDROmorphone (DILAUDID) 2 MG tablet Take 1 tablet (2 mg total) by mouth every 6 (six) hours as needed for severe pain. 56 tablet 0  . ipratropium-albuterol (DUONEB) 0.5-2.5 (3) MG/3ML SOLN Take 3 mLs by nebulization every 4 (four) hours as needed. 360 mL 3  . megestrol (MEGACE) 20 MG tablet TAKE ONE TABLET BY MOUTH ONCE DAILY 30 tablet 2  . metoprolol tartrate (LOPRESSOR) 50 MG tablet Take 50 mg by mouth 2 (two) times daily.  2  . naloxone (NARCAN) nasal spray 4 mg/0.1 mL For Opioid Overdose: Call 911 and administer a single spray of Narcan in one nostril. Repeat every 12mns as needed if no or minimal response 1  kit 2  . NINLARO 4 MG capsule TAKE 1 CAPSULE (4 MG TOTAL) BY MOUTH ONCE A WEEK. TAKE FOR 3 WEEKS ON, THEN 1 WEEK OFF. TAKE ON EMPTY STOMACH 1HR BEFORE OR 2HRS AFTER FOOD. 3 capsule 4  . ondansetron (ZOFRAN) 8 MG tablet 1 pill every 8 hours as needed. 40 tablet 0  . tamsulosin (FLOMAX) 0.4 MG CAPS capsule Take 1 capsule (0.4 mg total) by mouth daily. 30 capsule 0  . tiotropium (SPIRIVA HANDIHALER) 18 MCG inhalation capsule Place 1 capsule (18 mcg total) into inhaler and inhale daily. 30 capsule 6     . aspirin  325 mg Oral Daily  . atorvastatin  40 mg Oral q1800  . DULoxetine  30 mg Oral Daily  . enoxaparin (LOVENOX) injection  40 mg Subcutaneous Q24H  .  fentaNYL  1 patch Transdermal Q48H  . fentaNYL  1 patch Transdermal Q48H  . fluticasone furoate-vilanterol  1 puff Inhalation Daily  . megestrol  20 mg Oral Daily  . metoprolol tartrate  50 mg Oral BID  . tamsulosin  0.4 mg Oral Daily  . tiotropium  18 mcg Inhalation Daily    Infusions:   No Known Allergies  Social History   Socioeconomic History  . Marital status: Divorced    Spouse name: Not on file  . Number of children: Not on file  . Years of education: Not on file  . Highest education level: Not on file  Occupational History  . Not on file  Social Needs  . Financial resource strain: Not on file  . Food insecurity:    Worry: Not on file    Inability: Not on file  . Transportation needs:    Medical: Not on file    Non-medical: Not on file  Tobacco Use  . Smoking status: Current Every Day Smoker    Packs/day: 0.50    Years: 40.00    Pack years: 20.00    Types: Cigarettes  . Smokeless tobacco: Never Used  Substance and Sexual Activity  . Alcohol use: No    Alcohol/week: 0.0 standard drinks  . Drug use: No  . Sexual activity: Not on file  Lifestyle  . Physical activity:    Days per week: Not on file    Minutes per session: Not on file  . Stress: Not on file  Relationships  . Social connections:    Talks on phone: Not on file    Gets together: Not on file    Attends religious service: Not on file    Active member of club or organization: Not on file    Attends meetings of clubs or organizations: Not on file    Relationship status: Not on file  . Intimate partner violence:    Fear of current or ex partner: Not on file    Emotionally abused: Not on file    Physically abused: Not on file    Forced sexual activity: Not on file  Other Topics Concern  . Not on file  Social History Narrative  . Not on file    Family History  Problem Relation Age of Onset  . COPD Mother   . CAD Mother   . Heart attack Father   . Prostate cancer Maternal Grandfather    . Kidney cancer Neg Hx   . Bladder Cancer Neg Hx     PHYSICAL EXAM: Vitals:   03/08/18 2303 03/08/18 2355  BP: 115/69 108/85  Pulse: (!) 145 (!) 150  Resp: 20  Temp: (!) 101.1 F (38.4 C) 99.1 F (37.3 C)  SpO2: 92% 93%    No intake or output data in the 24 hours ending 03/09/18 0507  General:  Pale, cachetic, resting comfortably, no apparent distress. HEENT: normal Neck: supple. no JVD. Carotids 2+ bilat; no bruits. No lymphadenopathy or thryomegaly appreciated. Cor: PMI nondisplaced. Regular rate & rhythm. No rubs, gallops or murmurs. Lungs: clear, reguarly unlabored breathing Abdomen: soft, nontender, nondistended. No hepatosplenomegaly. No bruits or masses. Good bowel sounds. Extremities: no cyanosis, clubbing, rash, edema Neuro:Very difficult to arouse. Opened eyes briefly, but did not respond to questions  ECG: 03/08/18 1822 144bpm Sinus tachycardia with short PR Right bundle branch block Septal infarct , age undetermined Abnormal ECG  Telemetry: 03/09/18 0730 NSR 86bpm  Results for orders placed or performed during the hospital encounter of 03/07/18 (from the past 24 hour(s))  TSH     Status: None   Collection Time: 03/08/18  2:54 PM  Result Value Ref Range   TSH 1.143 0.350 - 4.500 uIU/mL  Vitamin B12     Status: None   Collection Time: 03/08/18  2:54 PM  Result Value Ref Range   Vitamin B-12 246 180 - 914 pg/mL  Ammonia     Status: None   Collection Time: 03/08/18  2:54 PM  Result Value Ref Range   Ammonia 15 9 - 35 umol/L  Lactic acid, plasma     Status: None   Collection Time: 03/08/18  6:27 PM  Result Value Ref Range   Lactic Acid, Venous 0.9 0.5 - 1.9 mmol/L  Lipid panel     Status: Abnormal   Collection Time: 03/09/18  4:07 AM  Result Value Ref Range   Cholesterol 84 0 - 200 mg/dL   Triglycerides 47 <150 mg/dL   HDL 31 (L) >40 mg/dL   Total CHOL/HDL Ratio 2.7 RATIO   VLDL 9 0 - 40 mg/dL   LDL Cholesterol 44 0 - 99 mg/dL   Ct Head Wo  Contrast  Result Date: 03/08/2018 CLINICAL DATA:  Altered mental status with hallucinations and agitation EXAM: CT HEAD WITHOUT CONTRAST TECHNIQUE: Contiguous axial images were obtained from the base of the skull through the vertex without intravenous contrast. COMPARISON:  January 03, 2018 FINDINGS: Brain: The ventricles are normal in size and configuration. There is no demonstrable mass, hemorrhage, extra-axial fluid collection, or midline shift. There is a focal area of decreased attenuation in the periphery of the posterior mid right frontal lobe, concerning for potential early infarct. Elsewhere brain parenchyma appears unremarkable. Vascular: No appreciable hyperdense vessel. There is calcification in each cavernous carotid artery region. Skull: The bony calvarium appears intact. Sinuses/Orbits: There is mucosal thickening in several ethmoid air cells. There is a retention cyst in the inferior right maxillary antrum. Orbits appear symmetric bilaterally. Other: Mastoid air cells are clear. IMPRESSION: Decreased attenuation in the posterior mid right frontal lobe, best appreciated on axial slice 24 series 7. Question early infarct in this area. Brain parenchyma elsewhere appears unremarkable. No mass or hemorrhage. There are foci of arterial vascular calcification. There are areas of paranasal sinus disease. Electronically Signed   By: Lowella Grip III M.D.   On: 03/08/2018 07:38     ASSESSMENT AND PLAN:  Episode of apparent sustained SVT last night with hypotension and agitation. SVT and hypotension resolved with saline bolus and Haldol. Pacemaker was reportedly interrogated, no report charted.  Most recent cardiac testing as below. No office visits since 06/28/17.  Nuclear Stress  test 01/25/17:        EKG RESULTS:  Sinus tachycardia. 111/min. 52m ST depression at peak exercise.  PERFUSION/WALL MOTION FINDINGS: EF = 60%. Moderate size and intensity inferoseptal and apical wall fixed defects.  Normal wall motion.                                    IMPRESSION:  Infarction in the RCA/LAD territories with normal LVEF.  Echo 01/02/17: Normal LVEF 60%, mild MR.   CCTA 08/28/16: Quality of study: Fair  1 - Calcium score is 832.9.  2 - Right dominant system.  3 - Severe calcification of LAD and LCX with no significant CAD.  Patient is currently stable, resting comfortably, echo is pending, pacemaker interrogation pending report. Continue current medically therapy and monitor for any changes.  KJake Bathe NP-C Cell: [phone]

## 2018-03-09 NOTE — Plan of Care (Signed)
  Problem: Education: Goal: Knowledge of General Education information will improve Description: Including pain rating scale, medication(s)/side effects and non-pharmacologic comfort measures Outcome: Progressing   Problem: Education: Goal: Knowledge of disease or condition will improve Outcome: Progressing Goal: Knowledge of secondary prevention will improve Outcome: Progressing Goal: Knowledge of patient specific risk factors addressed and post discharge goals established will improve Outcome: Progressing   Problem: Coping: Goal: Will identify appropriate support needs Outcome: Progressing   Problem: Health Behavior/Discharge Planning: Goal: Ability to manage health-related needs will improve Outcome: Progressing   Problem: Self-Care: Goal: Ability to participate in self-care as condition permits will improve Outcome: Progressing Goal: Ability to communicate needs accurately will improve Outcome: Progressing   Problem: Nutrition: Goal: Risk of aspiration will decrease Outcome: Progressing   Problem: Ischemic Stroke/TIA Tissue Perfusion: Goal: Complications of ischemic stroke/TIA will be minimized Outcome: Progressing   
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
## 2018-03-09 NOTE — Evaluation (Signed)
SLP Cancellation Note  Patient Details Name: [name] MRN: [id number] DOB: 1960-06-13   Cancelled treatment:       Reason Eval/Treat Not Completed: Fatigue/lethargy limiting ability to participate  SLP{ notified NSG  About pt level of participation and to contact st if pt becomes more aware.    [place] Sauber 03/09/2018, 9:03 AM

## 2018-03-09 NOTE — Progress Notes (Signed)
Note new password

## 2018-03-09 NOTE — Evaluation (Signed)
Physical Therapy Evaluation Patient Details Name: [name] MRN: [id number] DOB: 10/01/1960 Today's Date: 03/09/2018   History of Present Illness  57 y.o. male with a known history of CAD, multiple myeloma chronic back pain, COPD, chronic respiratory failure on 2 L oxygen, diastolic CHF, atrial septal defect, tobacco use presents to the hospital brought in by his girlfriend due to hallucination and agitation at home.  IVC was placed.  Initially thought to be psychosis.  CT scan of the head shows right frontal CVA.    Clinical Impression  Pt sleeping soundly on arrival, but did readily wake up (though he did nearly fall asleep a few times t/o the interview).  He was confused and disoriented t/o the session, though he did partially recognize that he was off.  He showed ability to rise to standing w/o AD, but was unsteady and impulsive and needed considerable assist to insure safety.  He attempted to walk and nearly ran into the wall with little apparent awareness and further ambulation was deferred for safety (it was difficult to corral him back to the EOB).  He showed some minimal L sided weakness in LE but generally appeared to be WFL when he was able to follow instructions. Pt's biggest issues are related to impulsivity and cognitively.  Per improvement (and 24/7 supervision) he should likely be able to return home, but is clearly not safe to do so at this time.      Follow Up Recommendations Supervision/Assistance - 24 hour(dependent on progress, unsafe to go home at this point)    Equipment Recommendations  None recommended by PT    Recommendations for Other Services       Precautions / Restrictions Precautions Precautions: Fall Restrictions Weight Bearing Restrictions: No      Mobility  Bed Mobility Overal bed mobility: Needs Assistance Bed Mobility: Supine to Sit     Supine to sit: Min assist     General bed mobility comments: cuing and light assist to get to EOB, able to  maintain sitting balance  Transfers Overall transfer level: Needs assistance Equipment used: None Transfers: Sit to/from Stand Sit to Stand: Min assist         General transfer comment: Pt impulsively attempted to stand w/o AD, he needed assist to insure stability but was able to physically rise.  Pt unsteady and impulsive in standing, but able to maintain static balance once PT was able to cue him to slow and be more aware  Ambulation/Gait Ambulation/Gait assistance: Mod assist;Max assist Gait Distance (Feet): 10 Feet Assistive device: None       General Gait Details: Pt with impulsive and unsteady ambulation. He was able to take a few steps but nearly walking right into the wall and showed little awareness with the effort.  He did not follow instructions well and it was difficult to steer him back to the bed safely.  Further ambulation would not have been prudent this date secondary to confusion.  Stairs            Wheelchair Mobility    Modified Rankin (Stroke Patients Only)       Balance Overall balance assessment: Needs assistance Sitting-balance support: Feet supported;Bilateral upper extremity supported Sitting balance-Leahy Scale: Fair Sitting balance - Comments: Pt did reasonably well sitting unsupported at EOB   Standing balance support: No upper extremity supported Standing balance-Leahy Scale: Poor Standing balance comment: Pt unsteady and impulsive and did not seem open to trying to use walker.  He has   unsteadiness, stagger steps and generally showed poor safety awareness.                             Pertinent Vitals/Pain Pain Assessment: No/denies pain    Home Living Family/patient expects to be discharged to:: Private residence Living Arrangements: Spouse/significant other;Non-relatives/Friends Available Help at Discharge: Family;Available 24 hours/day Type of Home: House Home Access: Stairs to enter   [employer] of  Steps: 3 Home Layout: One level Home Equipment: Walker - 2 wheels Additional Comments: Pt with scattered and inconsistent answers to questions, most info gathered from previous notes    Prior Function Level of Independence: Independent with assistive device(s)         Comments: Pt reports he can walk w/o AD, out of the house some, again pt confused and unable to fully engage     Hand Dominance        Extremity/Trunk Assessment   Upper Extremity Assessment Upper Extremity Assessment: Generalized weakness;Overall WFL for tasks assessed    Lower Extremity Assessment Lower Extremity Assessment: Generalized weakness(L side weaker than R, generally limited AROM)       Communication   Communication: (confused, but able to attempt appropriate communication)  Cognition Arousal/Alertness: Lethargic Behavior During Therapy: Impulsive;Restless Overall Cognitive Status: Impaired/Different from baseline                                 General Comments: Pt with general confusion and reports "I just feel really off."  Unable to state date, location, situation, etc      General Comments      Exercises     Assessment/Plan    PT Assessment Patient needs continued PT services  PT Problem List         PT Treatment Interventions DME instruction;Gait training;Stair training;Functional mobility training;Therapeutic activities;Therapeutic exercise;Balance training;Neuromuscular re-education;Patient/family education    PT Goals (Current goals can be found in the Care Plan section)  Acute Rehab PT Goals Patient Stated Goal: unable to report goals PT Goal Formulation: Patient unable to participate in goal setting Time For Goal Achievement: 03/23/18 Potential to Achieve Goals: Fair    Frequency 7X/week   Barriers to discharge        Co-evaluation               AM-PAC PT "6 Clicks" Mobility  Outcome Measure Help needed turning from your back to your side  while in a flat bed without using bedrails?: A Little Help needed moving from lying on your back to sitting on the side of a flat bed without using bedrails?: A Little Help needed moving to and from a bed to a chair (including a wheelchair)?: A Little Help needed standing up from a chair using your arms (e.g., wheelchair or bedside chair)?: A Little Help needed to walk in hospital room?: A Lot Help needed climbing 3-5 steps with a railing? : Total 6 Click Score: 15    End of Session Equipment Utilized During Treatment: Oxygen;Gait belt(4 liters) Activity Tolerance: Patient limited by fatigue(limited by mental status) Patient left: in bed;with call bell/phone within reach;with bed alarm set Nurse Communication: Mobility status PT Visit Diagnosis: Unsteadiness on feet (R26.81);Muscle weakness (generalized) (M62.81);Difficulty in walking, not elsewhere classified (R26.2)    Time: 9798-9211 PT Time Calculation (min) (ACUTE ONLY): 34 min   Charges:   PT Evaluation $PT Eval Low Complexity: 1 Low   R , DPT 03/09/2018, 11:58 AM   

## 2018-03-09 NOTE — Progress Notes (Signed)
Daughter Octavia Bruckner Daughter # [phone] called states that we can call   Janetta Hora (Sister) # [phone] if emergency comes up with the pt.Will continue to monitor.

## 2018-03-09 NOTE — Progress Notes (Addendum)
Pt BP was at 80/61 HR 105. Pt asymptomatic. Phrmacy just sent his Fentanyl patch 100 mcg. Talked to Dr. Cristela Felt nd ordered to give Fentanyl patch and he states " I will place order for the low blood pressure". Will continue to monitor.  Update : 0018: Pt BP at 102/73 with HR 93. Talked to Dr. Cristela Felt ordered to hold 50 mg metropolol and place one time order for 25 mg. Will continue to monitor.

## 2018-03-09 NOTE — Progress Notes (Signed)
[facility] [place] NAME: [name]    MR#:  [id number]  DATE OF BIRTH:  1960-11-25  SUBJECTIVE:  CHIEF COMPLAINT:   Chief Complaint  Patient presents with  . Hallucinations   Confused. Sitter at bedside  REVIEW OF SYSTEMS:    Review of Systems  Unable to perform ROS: Mental status change    DRUG ALLERGIES:  No Known Allergies  VITALS:  Blood pressure 100/71, pulse 86, temperature 98.9 F (37.2 C), temperature source Oral, resp. rate 18, height 6' (1.829 m), weight 56.7 kg, SpO2 95 %.  PHYSICAL EXAMINATION:   Physical Exam  GENERAL:  58 y.o.-year-old patient lying in the bed with no acute distress.  EYES: Pupils equal, round, reactive to light and accommodation. No scleral icterus. Extraocular muscles intact.  HEENT: Head atraumatic, normocephalic. Oropharynx and nasopharynx clear.  NECK:  Supple, no jugular venous distention. No thyroid enlargement, no tenderness.  LUNGS: Normal breath sounds bilaterally, no wheezing, rales, rhonchi. No use of accessory muscles of respiration.  CARDIOVASCULAR: S1, S2 normal. No murmurs, rubs, or gallops.  ABDOMEN: Soft, nontender, nondistended. Bowel sounds present. No organomegaly or mass.  EXTREMITIES: No cyanosis, clubbing or edema b/l.    NEUROLOGIC: Cranial nerves II through XII are intact. No focal Motor or sensory deficits b/l.   PSYCHIATRIC: The patient is drowzy, confused SKIN: No obvious rash, lesion, or ulcer.   LABORATORY PANEL:   CBC Recent Labs  Lab 03/07/18 1620  WBC 12.8*  HGB 10.8*  HCT 34.8*  PLT 209   ------------------------------------------------------------------------------------------------------------------ Chemistries  Recent Labs  Lab 03/07/18 1620  NA 137  K 3.8  CL 100  CO2 30  GLUCOSE 95  BUN 13  CREATININE 0.89  CALCIUM 9.0  AST 14*  ALT 7  ALKPHOS 44  BILITOT 0.8    ------------------------------------------------------------------------------------------------------------------  Cardiac Enzymes No results for input(s): TROPONINI in the last 168 hours. ------------------------------------------------------------------------------------------------------------------  RADIOLOGY:  Ct Head Wo Contrast  Result Date: 03/08/2018 CLINICAL DATA:  Altered mental status with hallucinations and agitation EXAM: CT HEAD WITHOUT CONTRAST TECHNIQUE: Contiguous axial images were obtained from the base of the skull through the vertex without intravenous contrast. COMPARISON:  January 03, 2018 FINDINGS: Brain: The ventricles are normal in size and configuration. There is no demonstrable mass, hemorrhage, extra-axial fluid collection, or midline shift. There is a focal area of decreased attenuation in the periphery of the posterior mid right frontal lobe, concerning for potential early infarct. Elsewhere brain parenchyma appears unremarkable. Vascular: No appreciable hyperdense vessel. There is calcification in each cavernous carotid artery region. Skull: The bony calvarium appears intact. Sinuses/Orbits: There is mucosal thickening in several ethmoid air cells. There is a retention cyst in the inferior right maxillary antrum. Orbits appear symmetric bilaterally. Other: Mastoid air cells are clear. IMPRESSION: Decreased attenuation in the posterior mid right frontal lobe, best appreciated on axial slice 24 series 7. Question early infarct in this area. Brain parenchyma elsewhere appears unremarkable. No mass or hemorrhage. There are foci of arterial vascular calcification. There are areas of paranasal sinus disease. Electronically Signed   By: Lowella Grip III M.D.   On: 03/08/2018 07:38   US Carotid Bilateral (at [facility] Only)  Result Date: 03/09/2018 CLINICAL DATA:  CVA.  History of CAD, hyperlipidemia and smoking. EXAM: BILATERAL CAROTID DUPLEX ULTRASOUND TECHNIQUE:  Pearline Cables scale imaging, color Doppler and duplex ultrasound were performed of bilateral carotid and vertebral arteries in the neck. COMPARISON:  None. FINDINGS:  Criteria: Quantification of carotid stenosis is based on velocity parameters that correlate the residual internal carotid diameter with NASCET-based stenosis levels, using the diameter of the distal internal carotid lumen as the denominator for stenosis measurement. The following velocity measurements were obtained: RIGHT ICA: 112/29 cm/sec CCA: 38/88 cm/sec SYSTOLIC ICA/CCA RATIO:  1.1 ECA: 221 cm/sec LEFT ICA: 102/36 cm/sec CCA: 28/00 cm/sec SYSTOLIC ICA/CCA RATIO:  1.2 ECA: 104 cm/sec RIGHT CAROTID ARTERY: There is a moderate amount of eccentric echogenic plaque within the right carotid bulb (image 16), not resulting in elevated peak systolic velocities within the interrogated course the right internal carotid artery to suggest a hemodynamically significant stenosis. RIGHT VERTEBRAL ARTERY:  Antegrade Flow LEFT CAROTID ARTERY: There is a moderate to large amount of eccentric mixed echogenic plaque within the left carotid bulb (images 48 and 49), extending to involve the origin and proximal aspects of the left internal carotid artery (image 56), not resulting in elevated peak systolic velocities within the interrogated course of the left internal carotid artery to suggest a hemodynamically significant stenosis. LEFT VERTEBRAL ARTERY:  Antegrade Flow Note is made of an apparent cardiac arrhythmia. IMPRESSION: 1. Moderate to large amount of bilateral atherosclerotic plaque, left greater than right, not resulting in a hemodynamically significant stenosis within either internal carotid artery. 2. Apparent cardiac arrhythmia. Further evaluation with ECG monitoring could be performed as clinically indicated. Electronically Signed   By: Sandi Mariscal M.D.   On: 03/09/2018 09:59     ASSESSMENT AND PLAN:   *SVT overnight.  Patient given fluid boluses and  metoprolol.  Improved.  Continue telemetry monitoring.  Cardiology consulted.  No further medications or investigations advised. Cardiology to evaluate defibrillator interrogation.  *Acute CVA of right frontal lobe. -Carotid dopplers, Echo - Start aspirin and statin. - Lovenox for DVT prophylaxis. - PT/OT/Speech consult as needed per symptoms - Neuro checks every 4 hours for 24 hours. No MRI with cardiac defibrillator Discussed with neurology team.  Planning on repeating CT scan of the head with contrast.  *Acute encephalopathy.  Could be due to CVA.  Will also consult psychiatry to rule out psychiatric causes.  He does have history of substance abuse.  Urine drug screen positive for cocaine.  *Hypertension.  Hold medications for permissive hypertension  *COPD with chronic respiratory failure.  On 2-3 L oxygen.  No wheezing.  Continue home inhalers and nebulizers as needed  *Multiple myeloma.  Follows outpatient with cancer center.  Discussed with patient's fianc over the phone.  At baseline he carries on normal conversations and able to ambulate.  DVT prophylaxis Lovenox  All the records are reviewed and case discussed with Care Management/Social Worker Management plans discussed with the patient, family and they are in agreement.  CODE STATUS: FULL CODE  DVT Prophylaxis: SCDs  TOTAL TIME TAKING CARE OF THIS PATIENT: 35 minutes.   POSSIBLE D/C IN 1-2 DAYS, DEPENDING ON CLINICAL CONDITION.  Leia Alf Valon Glasscock M.D on 03/09/2018 at 11:33 AM  Between 7am to 6pm - Pager - [phone]  After 6pm go to [email or web address] - password [email or web address] [facility]  Office  [phone]  CC: Primary care physician; Petra Kuba, MD  Note: This dictation was prepared with Dragon dictation along with smaller phrase technology. Any transcriptional errors that result from this process are unintentional.

## 2018-03-09 NOTE — Progress Notes (Signed)
Family members are all giving different versions  "Fiance" said she is in charge of his medical care, but she has no papers.  Daughter says she is in charge.  Legally she has the right since there are no papers from fiance at this time.  Daughter = Octavia Bruckner [phone] - please call daughter for all medical stuff.  Phillis Knack, RN

## 2018-03-09 NOTE — Progress Notes (Signed)
OT Cancellation Note  Patient Details Name: [name] MRN: [id number] DOB: [birth year]   Cancelled Treatment:    Reason Eval/Treat Not Completed: Patient declined, no reason specified. Consult received, chart reviewed. Pt refusing therapy. Per RN, girlfriend coming to visit pt soon. Per RN, girlfriend reports that pt might be more receptive to therapy once she arrives. RN will call OT when girlfriend arrives to help maximize willingness to participate.  Jeni Salles, MPH, MS, OTR/L ascom [phone] 03/09/18, 9:53 AM

## 2018-03-09 NOTE — Progress Notes (Signed)
Family was worried the patient was hot.  Temperature was taken 3 times, it is now trending down and is 51F  Phillis Knack, RN

## 2018-03-09 NOTE — Progress Notes (Addendum)
Received verbal order from Dr. Cristela Felt for 1L of LR over 2 hours, 2mg  of IV ativan. Order in from Dr. Jerelyn Charles on prior conversation to transfer patient to 2A. Waiting for receiving RN.

## 2018-03-09 NOTE — Progress Notes (Signed)
   03/09/18 1230  Clinical Encounter Type  Visited With Family (patient's daughter [name] via telephone)  Visit Type Other (Comment) (phone call)  Referral From [facility] reached out to woman who had called in earlier inquiring about possible AD for her father, but chaplain was paged away from that call.  During this call, chaplain determined that caller's father was a current patient at [facility] provided education on how ADs work and role of patient self-determination.  Chaplain utilized active and reflective listening as patient's daughter [name] discussed family dynamics and concerns regarding patient's current living situation as reported to her by patient's brother and sister.  Daughter plans to speak with patient's nurse and chaplain recommended inquiring about involving social work and/or case management with her concerns.  Daughter open to chaplain making an introductory visit to patient; chaplain indicated that patient would be able to accept or decline the visit and that chaplain would follow patient's lead.

## 2018-03-09 NOTE — Progress Notes (Signed)
Subjective: Periods of agitation this AM and confusion. No new focal abnormalities or deficits found    Past Medical History:  Diagnosis Date  . Anxiety   . Arthritis   . Atrial septal defect   . CHF (congestive heart failure) ([place])   . COPD (chronic obstructive pulmonary disease) ([place])   . Coronary artery disease   . Dysrhythmias   . GERD (gastroesophageal reflux disease)   . Hyperlipidemia   . Multiple myeloma ([place])   . Multiple myeloma ([place])   . Myocardial infarction ([place])   . Substance abuse [facility])     Past Surgical History:  Procedure Laterality Date  . CARDIAC DEFIBRILLATOR PLACEMENT      Family History  Problem Relation Age of Onset  . COPD Mother   . CAD Mother   . Heart attack Father   . Prostate cancer Maternal Grandfather   . Kidney cancer Neg Hx   . Bladder Cancer Neg Hx    Social History:  reports that he has been smoking cigarettes. He has a 20.00 pack-year smoking history. He has never used smokeless tobacco. He reports that he does not drink alcohol or use drugs.  Allergies: No Known Allergies  Medications:  I have reviewed the patient's current medications. Prior to Admission:  Medications Prior to Admission  Medication Sig Dispense Refill Last Dose  . acetaminophen (TYLENOL) 325 MG tablet Take 2 tablets (650 mg total) by mouth every 6 (six) hours as needed for mild pain (or Fever >/= 101).   prn at prn  . albuterol (PROVENTIL HFA;VENTOLIN HFA) 108 (90 Base) MCG/ACT inhaler Inhale 2 puffs into the lungs every 6 (six) hours as needed for wheezing or shortness of breath. 1 Inhaler 6 prn at prn  . ALPRAZolam (XANAX) 0.5 MG tablet Take 1 tablet (0.5 mg total) by mouth 3 (three) times daily as needed for anxiety. 90 tablet 1 03/07/2018 at 1400  . aspirin 325 MG EC tablet Take 325 mg by mouth daily.   03/07/2018 at 0930  . atorvastatin (LIPITOR) 20 MG tablet Take 20 mg by mouth daily.  2 03/07/2018 at 0930  . dexamethasone (DECADRON) 4 MG tablet TAKE 3 TABLETS  (12 MG TOTAL) BY MOUTH ONCE A WEEK. TAKE WITH BREAKFAST 12 tablet 4 Past Week at Unknown time  . DULoxetine (CYMBALTA) 30 MG capsule TAKE ONE CAPSULE BY MOUTH EVERY DAY 90 capsule 1 03/07/2018 at 0930  . fentaNYL (DURAGESIC) 100 MCG/HR Place 1 patch onto the skin every other day. Use with a 25 mcg patch for total dose of 125 mcg 15 patch 0 03/06/2018 at 0930  . fentaNYL (DURAGESIC) 25 MCG/HR Place 1 patch onto the skin every other day. Use with a 100 mcg patch for total dose of 125 mcg 15 patch 0 03/06/2018 at 0930  . Fluticasone-Salmeterol (ADVAIR DISKUS) 250-50 MCG/DOSE AEPB Inhale 1 puff into the lungs 2 (two) times daily. 3 each 2 03/07/2018 at 0930  . gabapentin (NEURONTIN) 100 MG capsule Take 2 capsules (200 mg total) by mouth 3 (three) times daily. 180 capsule 3 03/07/2018 at 1400  . HYDROmorphone (DILAUDID) 2 MG tablet Take 1 tablet (2 mg total) by mouth every 6 (six) hours as needed for severe pain. 56 tablet 0 03/07/2018 at 1400  . ipratropium-albuterol (DUONEB) 0.5-2.5 (3) MG/3ML SOLN Take 3 mLs by nebulization every 4 (four) hours as needed. 360 mL 3 prn at prn  . megestrol (MEGACE) 20 MG tablet TAKE ONE TABLET BY MOUTH ONCE DAILY 30 tablet  2 03/07/2018 at 0930  . metoprolol tartrate (LOPRESSOR) 50 MG tablet Take 50 mg by mouth 2 (two) times daily.  2 03/07/2018 at 0930  . naloxone [facility]) nasal spray 4 mg/0.1 mL For Opioid Overdose: Call 911 and administer a single spray of Narcan in one nostril. Repeat every 7mns as needed if no or minimal response 1 kit 2   . NINLARO 4 MG capsule TAKE 1 CAPSULE (4 MG TOTAL) BY MOUTH ONCE A WEEK. TAKE FOR 3 WEEKS ON, THEN 1 WEEK OFF. TAKE ON EMPTY STOMACH 1HR BEFORE OR 2HRS AFTER FOOD. 3 capsule 4 Past Month at Unknown time  . ondansetron (ZOFRAN) 8 MG tablet 1 pill every 8 hours as needed. 40 tablet 0 prn at prn  . tamsulosin (FLOMAX) 0.4 MG CAPS capsule Take 1 capsule (0.4 mg total) by mouth daily. 30 capsule 0 03/07/2018 at 0930  . tiotropium (SPIRIVA  HANDIHALER) 18 MCG inhalation capsule Place 1 capsule (18 mcg total) into inhaler and inhale daily. 30 capsule 6 03/07/2018 at 0930   Prior to Admission medications   Medication Sig Start Date End Date Taking? Authorizing Provider  acetaminophen (TYLENOL) 325 MG tablet Take 2 tablets (650 mg total) by mouth every 6 (six) hours as needed for mild pain (or Fever >/= 101). 05/28/16  Yes Gouru, AIllene Silver MD  albuterol (PROVENTIL HFA;VENTOLIN HFA) 108 (90 Base) MCG/ACT inhaler Inhale 2 puffs into the lungs every 6 (six) hours as needed for wheezing or shortness of breath. 04/09/17  Yes KFlora Lipps MD  ALPRAZolam (Duanne Moron 0.5 MG tablet Take 1 tablet (0.5 mg total) by mouth 3 (three) times daily as needed for anxiety. 02/11/18  Yes BCammie Sickle MD  aspirin 325 MG EC tablet Take 325 mg by mouth daily.   Yes [provider]  atorvastatin (LIPITOR) 20 MG tablet Take 20 mg by mouth daily. 10/11/16  Yes [provider]  dexamethasone (DECADRON) 4 MG tablet TAKE 3 TABLETS (12 MG TOTAL) BY MOUTH ONCE A WEEK. TAKE WITH BREAKFAST 01/18/18  Yes FLloyd Huger MD  DULoxetine (CYMBALTA) 30 MG capsule TAKE ONE CAPSULE BY MOUTH EVERY DAY 11/21/17  Yes BCammie Sickle MD  fentaNYL (DURAGESIC) 100 MCG/HR Place 1 patch onto the skin every other day. Use with a 25 mcg patch for total dose of 125 mcg 02/21/18  Yes BCammie Sickle MD  fentaNYL (DURAGESIC) 25 MCG/HR Place 1 patch onto the skin every other day. Use with a 100 mcg patch for total dose of 125 mcg 02/21/18  Yes BCammie Sickle MD  Fluticasone-Salmeterol (ADVAIR DISKUS) 250-50 MCG/DOSE AEPB Inhale 1 puff into the lungs 2 (two) times daily. 07/20/17 07/20/18 Yes Kasa, KMaretta Bees MD  gabapentin (NEURONTIN) 100 MG capsule Take 2 capsules (200 mg total) by mouth 3 (three) times daily. 11/27/17  Yes BCammie Sickle MD  HYDROmorphone (DILAUDID) 2 MG tablet Take 1 tablet (2 mg total) by mouth every 6 (six) hours as needed for  severe pain. 03/07/18  Yes BCammie Sickle MD  ipratropium-albuterol (DUONEB) 0.5-2.5 (3) MG/3ML SOLN Take 3 mLs by nebulization every 4 (four) hours as needed. 04/09/17  Yes KFlora Lipps MD  megestrol (MEGACE) 20 MG tablet TAKE ONE TABLET BY MOUTH ONCE DAILY 02/26/18  Yes BCammie Sickle MD  metoprolol tartrate (LOPRESSOR) 50 MG tablet Take 50 mg by mouth 2 (two) times daily. 08/21/16  Yes [provider]  naloxone (NARCAN) nasal spray 4 mg/0.1 mL For Opioid Overdose: Call 911 and administer  a single spray of Narcan in one nostril. Repeat every 24mns as needed if no or minimal response 02/20/18  Yes Brahmanday, GElisha Headland MD  NINLARO 4 MG capsule TAKE 1 CAPSULE (4 MG TOTAL) BY MOUTH ONCE A WEEK. TAKE FOR 3 WEEKS ON, THEN 1 WEEK OFF. TAKE ON EMPTY STOMACH 1HR BEFORE OR 2HRS AFTER FOOD. 01/18/18  Yes FLloyd Huger MD  ondansetron (ZOFRAN) 8 MG tablet 1 pill every 8 hours as needed. 10/31/17  Yes BCammie Sickle MD  tamsulosin (FLOMAX) 0.4 MG CAPS capsule Take 1 capsule (0.4 mg total) by mouth daily. 02/18/18  Yes PDustin Flock MD  tiotropium (SPIRIVA HANDIHALER) 18 MCG inhalation capsule Place 1 capsule (18 mcg total) into inhaler and inhale daily. 04/09/17 04/09/18 Yes KFlora Lipps MD    Scheduled: . aspirin  325 mg Oral Daily  . atorvastatin  40 mg Oral q1800  . DULoxetine  30 mg Oral Daily  . enoxaparin (LOVENOX) injection  40 mg Subcutaneous Q24H  . fentaNYL  1 patch Transdermal Q48H  . fentaNYL  1 patch Transdermal Q48H  . fluticasone furoate-vilanterol  1 puff Inhalation Daily  . megestrol  20 mg Oral Daily  . metoprolol tartrate  50 mg Oral BID  . tamsulosin  0.4 mg Oral Daily  . tiotropium  18 mcg Inhalation Daily    ROS: Unable to obtain from patient due to current altered mental status  Physical Examination: Blood pressure 100/71, pulse 86, temperature 98.9 F (37.2 C), temperature source Oral, resp. rate 18, height 6' (1.829 m), weight 56.7 kg,  SpO2 95 %.   HEENT-  Normocephalic, no lesions, without obvious abnormality.  Normal external eye and conjunctiva.  Normal TM's bilaterally.  Normal auditory canals and external ears. Normal external nose, mucus membranes and septum.  Normal pharynx. Cardiovascular- S1, S2 normal, pulses palpable throughout   Lungs- chest clear, no wheezing, rales, normal symmetric air entry Abdomen- soft, non-tender; bowel sounds normal; no masses,  no organomegaly Extremities- no edema Lymph-no adenopathy palpable Musculoskeletal-no joint tenderness, deformity or swelling Skin-warm and dry, no hyperpigmentation, vitiligo, or suspicious lesions  Neurological Exam   Mental Status: Alert, oriented to person but not to place or time, thought content mildly inappropriate.  Able to identify fiancee by name and relationship to him. Speech fluent without evidence of aphasia.  Able to follow simple commands but with some difficulty. Requires prompting and cueing. Attention span and concentration seemed mildly impaired.  Cranial Nerves: II: Discs flat bilaterally; Visual fields grossly normal, pupils equal, round, reactive to light and accommodation III,IV, VI: ptosis not present, extra-ocular motions intact bilaterally V,VII: smile symmetric, facial light touch sensation intact VIII: hearing normal bilaterally IX,X: gag reflex present XI: bilateral shoulder shrug XII: midline tongue extension Motor: Right :  Upper extremity   5/5 Without pronator drift      Left: Upper extremity   5/5 without pronator drift Right:   Lower extremity   5/5                                          Left: Lower extremity   5/5 Tone and bulk:normal tone throughout; no atrophy noted Sensory: Pinprick and light touch intact bilaterally Deep Tendon Reflexes: 2+ and symmetric throughout Plantars: Right: mute  Left: mute Cerebellar: Finger-to-nose testing intact bilaterally. Heel to shin testing normal  bilaterally Gait: not tested due to safety concerns  Data Reviewed  Laboratory Studies:  Basic Metabolic Panel: Recent Labs  Lab 03/07/18 1620  NA 137  K 3.8  CL 100  CO2 30  GLUCOSE 95  BUN 13  CREATININE 0.89  CALCIUM 9.0    Liver Function Tests: Recent Labs  Lab 03/07/18 1620  AST 14*  ALT 7  ALKPHOS 44  BILITOT 0.8  PROT 6.9  ALBUMIN 3.6   No results for input(s): LIPASE, AMYLASE in the last 168 hours. Recent Labs  Lab 03/08/18 1454  AMMONIA 15    CBC: Recent Labs  Lab 03/07/18 1620  WBC 12.8*  NEUTROABS 10.8*  HGB 10.8*  HCT 34.8*  MCV 89.9  PLT 209    Cardiac Enzymes: No results for input(s): CKTOTAL, CKMB, CKMBINDEX, TROPONINI in the last 168 hours.  BNP: Invalid input(s): POCBNP  CBG: No results for input(s): GLUCAP in the last 168 hours.  Microbiology: Results for orders placed or performed during the hospital encounter of 02/15/18  Gastrointestinal Panel by PCR , Stool     Status: Abnormal   Collection Time: 02/15/18 11:52 AM  Result Value Ref Range Status   Campylobacter species NOT DETECTED NOT DETECTED Final   Plesimonas shigelloides NOT DETECTED NOT DETECTED Final   Salmonella species NOT DETECTED NOT DETECTED Final   Yersinia enterocolitica NOT DETECTED NOT DETECTED Final   Vibrio species NOT DETECTED NOT DETECTED Final   Vibrio cholerae NOT DETECTED NOT DETECTED Final   Enteroaggregative E coli (EAEC) NOT DETECTED NOT DETECTED Final   Enteropathogenic E coli (EPEC) NOT DETECTED NOT DETECTED Final   Enterotoxigenic E coli (ETEC) NOT DETECTED NOT DETECTED Final   Shiga like toxin producing E coli (STEC) NOT DETECTED NOT DETECTED Final   Shigella/Enteroinvasive E coli (EIEC) NOT DETECTED NOT DETECTED Final   Cryptosporidium NOT DETECTED NOT DETECTED Final   Cyclospora cayetanensis NOT DETECTED NOT DETECTED Final   Entamoeba histolytica NOT DETECTED NOT DETECTED Final   Giardia lamblia NOT DETECTED NOT DETECTED Final    Adenovirus F40/41 NOT DETECTED NOT DETECTED Final   Astrovirus NOT DETECTED NOT DETECTED Final   Norovirus GI/GII NOT DETECTED NOT DETECTED Final   Rotavirus A NOT DETECTED NOT DETECTED Final   Sapovirus (I, II, IV, and V) DETECTED (A) NOT DETECTED Final    Comment: Performed at [facility], [address] [place], [place] [address]  C difficile quick scan w PCR reflex     Status: None   Collection Time: 02/15/18 11:52 AM  Result Value Ref Range Status   C Diff antigen NEGATIVE NEGATIVE Final   C Diff toxin NEGATIVE NEGATIVE Final   C Diff interpretation No C. difficile detected.  Final    Comment: Performed at [facility], [address] [place], [place] [address]  Blood culture (routine x 2)     Status: None   Collection Time: 02/15/18  2:52 PM  Result Value Ref Range Status   Specimen Description BLOOD BLOOD RIGHT FOREARM  Final   Special Requests   Final    BOTTLES DRAWN AEROBIC AND ANAEROBIC Blood Culture results may not be optimal due to an excessive volume of blood received in culture bottles   Culture   Final    NO GROWTH 5 DAYS Performed at [facility], [address], [place], [place] [address]    Report Status 02/20/2018 FINAL  Final  Blood culture (routine x 2)     Status: None   Collection Time: 02/15/18  2:54 PM  Result Value Ref Range Status   Specimen Description BLOOD BLOOD RIGHT WRIST  Final   Special Requests   Final    BOTTLES DRAWN AEROBIC AND ANAEROBIC Blood Culture adequate volume   Culture   Final    NO GROWTH 5 DAYS Performed at [facility], [address] [place], [place] [address]    Report Status 02/20/2018 FINAL  Final    Coagulation Studies: No results for input(s): LABPROT, INR in the last 72 hours.  Urinalysis:  Recent Labs  Lab 03/07/18 2201  COLORURINE YELLOW*  LABSPEC 1.018  PHURINE 6.0  GLUCOSEU NEGATIVE  HGBUR NEGATIVE  BILIRUBINUR NEGATIVE  KETONESUR NEGATIVE  PROTEINUR NEGATIVE   NITRITE NEGATIVE  LEUKOCYTESUR NEGATIVE    Lipid Panel:    Component Value Date/Time   CHOL 84 03/09/2018 0407   CHOL 135 05/30/2012 0125   TRIG 47 03/09/2018 0407   TRIG 73 05/30/2012 0125   HDL 31 (L) 03/09/2018 0407   HDL 39 (L) 05/30/2012 0125   CHOLHDL 2.7 03/09/2018 0407   VLDL 9 03/09/2018 0407   VLDL 15 05/30/2012 0125   LDLCALC 44 03/09/2018 0407   LDLCALC 81 05/30/2012 0125    HgbA1C:  Lab Results  Component Value Date   HGBA1C 5.4 12/23/2014    Urine Drug Screen:      Component Value Date/Time   LABOPIA POSITIVE (A) 03/07/2018 2201   COCAINSCRNUR NONE DETECTED 03/07/2018 2201   LABBENZ POSITIVE (A) 03/07/2018 2201   AMPHETMU NONE DETECTED 03/07/2018 2201   THCU POSITIVE (A) 03/07/2018 2201   LABBARB NONE DETECTED 03/07/2018 2201    Alcohol Level: No results for input(s): ETH in the last 168 hours.  Other results: EKG: brief SVT yesterday   Imaging: Ct Head Wo Contrast  Result Date: 03/08/2018 CLINICAL DATA:  Altered mental status with hallucinations and agitation EXAM: CT HEAD WITHOUT CONTRAST TECHNIQUE: Contiguous axial images were obtained from the base of the skull through the vertex without intravenous contrast. COMPARISON:  January 03, 2018 FINDINGS: Brain: The ventricles are normal in size and configuration. There is no demonstrable mass, hemorrhage, extra-axial fluid collection, or midline shift. There is a focal area of decreased attenuation in the periphery of the posterior mid right frontal lobe, concerning for potential early infarct. Elsewhere brain parenchyma appears unremarkable. Vascular: No appreciable hyperdense vessel. There is calcification in each cavernous carotid artery region. Skull: The bony calvarium appears intact. Sinuses/Orbits: There is mucosal thickening in several ethmoid air cells. There is a retention cyst in the inferior right maxillary antrum. Orbits appear symmetric bilaterally. Other: Mastoid air cells are clear.  IMPRESSION: Decreased attenuation in the posterior mid right frontal lobe, best appreciated on axial slice 24 series 7. Question early infarct in this area. Brain parenchyma elsewhere appears unremarkable. No mass or hemorrhage. There are foci of arterial vascular calcification. There are areas of paranasal sinus disease. Electronically Signed   By: Lowella Grip III M.D.   On: 03/08/2018 07:38   US Carotid Bilateral (at [facility] Only)  Result Date: 03/09/2018 CLINICAL DATA:  CVA.  History of CAD, hyperlipidemia and smoking. EXAM: BILATERAL CAROTID DUPLEX ULTRASOUND TECHNIQUE: Pearline Cables scale imaging, color Doppler and duplex ultrasound were performed of bilateral carotid and vertebral arteries in the neck. COMPARISON:  None. FINDINGS: Criteria: Quantification of carotid stenosis is based on velocity parameters that correlate the residual  internal carotid diameter with NASCET-based stenosis levels, using the diameter of the distal internal carotid lumen as the denominator for stenosis measurement. The following velocity measurements were obtained: RIGHT ICA: 112/29 cm/sec CCA: 51/02 cm/sec SYSTOLIC ICA/CCA RATIO:  1.1 ECA: 221 cm/sec LEFT ICA: 102/36 cm/sec CCA: 58/52 cm/sec SYSTOLIC ICA/CCA RATIO:  1.2 ECA: 104 cm/sec RIGHT CAROTID ARTERY: There is a moderate amount of eccentric echogenic plaque within the right carotid bulb (image 16), not resulting in elevated peak systolic velocities within the interrogated course the right internal carotid artery to suggest a hemodynamically significant stenosis. RIGHT VERTEBRAL ARTERY:  Antegrade Flow LEFT CAROTID ARTERY: There is a moderate to large amount of eccentric mixed echogenic plaque within the left carotid bulb (images 48 and 49), extending to involve the origin and proximal aspects of the left internal carotid artery (image 56), not resulting in elevated peak systolic velocities within the interrogated course of the left internal carotid artery to suggest a  hemodynamically significant stenosis. LEFT VERTEBRAL ARTERY:  Antegrade Flow Note is made of an apparent cardiac arrhythmia. IMPRESSION: 1. Moderate to large amount of bilateral atherosclerotic plaque, left greater than right, not resulting in a hemodynamically significant stenosis within either internal carotid artery. 2. Apparent cardiac arrhythmia. Further evaluation with ECG monitoring could be performed as clinically indicated. Electronically Signed   By: Sandi Mariscal M.D.   On: 03/09/2018 09:59   Assessment: 58 y.o. male ith past medical history of multiple myeloma, myocardial infarction, COPD on chronic oxygen, CHF, polysubstance abuse, hyperlipidemia, CAD, defibrillator placement, and hyperthyroidism presenting to the ED on 03/07/2018 under IVC for altered mental status associated with behavioral disturbances. A non-contrast head CT showed decreased attenuation in the posterior mid right frontal lobe questionable infarct in this area which maybe contributing to change in mental status. However cannot rule out other etiology given hx of multiple myeloma.  Unable to obtain MRI brain due to presence of defibrillator. Patient was on Aspirin 81 mg prior to this event. Further work up pending.  Stroke Risk Factors - carotid stenosis, family history, hyperlipidemia, hypertension and smoking  Plan: 1. HgbA1c, fasting lipid panel pending 2. Check labs:  RPR, thyroid-stimulating hormone (TSH) -reflex, lyme reflex, thiamine, Ammonia, B12 3. PT consult, OT consult, Speech consult 4. Echocardiogram EF o 60% 5. Repeat CT head and CTA h/n ordered to be done today  6. ON ASA 325 daily  7. NPO until RN stroke swallow screen 8. Telemetry monitoring 9. Frequent neuro checks 10. Likely has hypercoagulable state in setting of multiple myeloma that has progressed.    03/09/2018, 11:28 AM

## 2018-03-09 NOTE — Progress Notes (Signed)
OT Cancellation Note  Patient Details Name: [name] MRN: [id number] DOB: [birth year]   Cancelled Treatment:    Reason Eval/Treat Not Completed: Patient at procedure or test/ unavailable. Order received, chart reviewed. Pt out of room for testing. Will re-attempt OT evaluation at later date/time as pt is available and medically appropriate.  Jeni Salles, MPH, MS, OTR/L ascom [phone] 03/09/18, 1:13 PM

## 2018-03-10 ENCOUNTER — Inpatient Hospital Stay: Admit: 2018-03-10 | Payer: Medicaid Other

## 2018-03-10 DIAGNOSIS — R079 Chest pain, unspecified: Secondary | ICD-10-CM

## 2018-03-10 LAB — COMPREHENSIVE METABOLIC PANEL
ALBUMIN: 2.8 g/dL — AB (ref 3.5–5.0)
ALT: 7 U/L (ref 0–44)
AST: 18 U/L (ref 15–41)
Alkaline Phosphatase: 41 U/L (ref 38–126)
Anion gap: 4 — ABNORMAL LOW (ref 5–15)
BUN: 6 mg/dL (ref 6–20)
CO2: 27 mmol/L (ref 22–32)
Calcium: 8 mg/dL — ABNORMAL LOW (ref 8.9–10.3)
Chloride: 108 mmol/L (ref 98–111)
Creatinine, Ser: 0.54 mg/dL — ABNORMAL LOW (ref 0.61–1.24)
GFR calc Af Amer: >60 mL/min (ref >60)
GFR calc non Af Amer: >60 mL/min (ref >60)
Glucose, Bld: 94 mg/dL (ref 70–99)
Potassium: 3.6 mmol/L (ref 3.5–5.1)
Sodium: 139 mmol/L (ref 135–145)
Total Bilirubin: 0.8 mg/dL (ref 0.3–1.2)
Total Protein: 5.7 g/dL — ABNORMAL LOW (ref 6.5–8.1)

## 2018-03-10 LAB — CBC WITH DIFFERENTIAL/PLATELET
Abs Immature Granulocytes: 0.06 10*3/uL (ref 0.00–0.07)
Basophils Absolute: 0 10*3/uL (ref 0.0–0.1)
Basophils Relative: 0 %
Eosinophils Absolute: 0.3 10*3/uL (ref 0.0–0.5)
Eosinophils Relative: 2 %
HCT: 30.2 % — ABNORMAL LOW (ref 39.0–52.0)
Hemoglobin: 9.6 g/dL — ABNORMAL LOW (ref 13.0–17.0)
Immature Granulocytes: 1 %
Lymphocytes Relative: 9 %
Lymphs Abs: 1 10*3/uL (ref 0.7–4.0)
MCH: 28.2 pg (ref 26.0–34.0)
MCHC: 31.8 g/dL (ref 30.0–36.0)
MCV: 88.8 fL (ref 80.0–100.0)
Monocytes Absolute: 1.4 10*3/uL — ABNORMAL HIGH (ref 0.1–1.0)
Monocytes Relative: 13 %
NEUTROS PCT: 75 %
Neutro Abs: 8.2 10*3/uL — ABNORMAL HIGH (ref 1.7–7.7)
Platelets: 190 10*3/uL (ref 150–400)
RBC: 3.4 MIL/uL — AB (ref 4.22–5.81)
RDW: 14.8 % (ref 11.5–15.5)
WBC: 10.9 10*3/uL — AB (ref 4.0–10.5)
nRBC: 0 % (ref 0.0–0.2)

## 2018-03-10 LAB — URINALYSIS, ROUTINE W REFLEX MICROSCOPIC
Bilirubin Urine: NEGATIVE
Glucose, UA: NEGATIVE mg/dL
Hgb urine dipstick: NEGATIVE
Ketones, ur: NEGATIVE mg/dL
Leukocytes,Ua: NEGATIVE
Nitrite: NEGATIVE
Protein, ur: NEGATIVE mg/dL
SPECIFIC GRAVITY, URINE: 1.003 — AB (ref 1.005–1.030)
pH: 7 (ref 5.0–8.0)

## 2018-03-10 LAB — PHOSPHORUS: Phosphorus: 2.8 mg/dL (ref 2.5–4.6)

## 2018-03-10 LAB — MAGNESIUM: Magnesium: 1.7 mg/dL (ref 1.7–2.4)

## 2018-03-10 LAB — TROPONIN I: Troponin I: <0.03 ng/mL (ref <0.03)

## 2018-03-10 MED ORDER — DIGOXIN 250 MCG PO TABS
0.2500 mg | ORAL_TABLET | Freq: Every day | ORAL | Status: DC
  Administered 2018-03-10 – 2018-03-13 (×4): 0.25 mg via ORAL
  Filled 2018-03-10 (×4): qty 1

## 2018-03-10 MED ORDER — METOPROLOL TARTRATE 25 MG PO TABS
25.0000 mg | ORAL_TABLET | Freq: Once | ORAL | Status: AC
  Administered 2018-03-10: 25 mg via ORAL

## 2018-03-10 NOTE — Plan of Care (Signed)
  Problem: Education: Goal: Knowledge of General Education information will improve Description Including pain rating scale, medication(s)/side effects and non-pharmacologic comfort measures Outcome: Progressing   Problem: Education: Goal: Knowledge of disease or condition will improve Outcome: Progressing   

## 2018-03-10 NOTE — Progress Notes (Signed)
Physical Therapy Treatment Patient Details Name: [name] MRN: [id number] DOB: Jul 09, 1960 Today's Date: 03/10/2018    History of Present Illness 58 y.o. male with a known history of CAD, multiple myeloma chronic back pain, COPD, chronic respiratory failure on 2 L oxygen, diastolic CHF, atrial septal defect, tobacco use presents to the hospital brought in by his girlfriend due to hallucination and agitation at home.  IVC was placed.  Initially thought to be psychosis.  CT scan of the head shows right frontal CVA.      PT Comments    Pt is able to ambulate around the nurses' station today, did not have only overt LOBs, but had unsteadiness and occasional stagger steps much of the effort.  Suggested pt use walker initially and he agreed.  Pt showed good effort with standing balance activities, but needed significant b/l UE assist to perform dynamic challenges.     Follow Up Recommendations  Home health PT;Supervision/Assistance - 24 hour     Equipment Recommendations  None recommended by PT    Recommendations for Other Services       Precautions / Restrictions Precautions Precautions: Fall Restrictions Weight Bearing Restrictions: No    Mobility  Bed Mobility Overal bed mobility: Independent Bed Mobility: Supine to Sit     Supine to sit: Modified independent (Device/Increase time)     General bed mobility comments: Pt able to easily shift up to sitting at EOB w/o assist  Transfers Overall transfer level: Modified independent Equipment used: None             General transfer comment: Pt able to rise with much more awareness and safety today. Still with some unsteadiness, balance concerns.  Ambulation/Gait Ambulation/Gait assistance: Min guard Gait Distance (Feet): 200 Feet Assistive device: None       General Gait Details: Pt much more consistent and stable than on eval, but had frequent small scale unsteadiness/stagger steps. He never needed direct phyiscal  assist to stay upright, but close CGA clearly needed    Stairs             Wheelchair Mobility    Modified Rankin (Stroke Patients Only)       Balance Overall balance assessment: Needs assistance Sitting-balance support: Feet supported;Bilateral upper extremity supported Sitting balance-Leahy Scale: Good     Standing balance support: No upper extremity supported Standing balance-Leahy Scale: Fair                              Cognition Arousal/Alertness: Awake/alert Behavior During Therapy: WFL for tasks assessed/performed Overall Cognitive Status: Impaired/Different from baseline                                 General Comments: Pt still with confusion and some disorientation, but clearly much more alert and aware than yesterday.        Exercises Other Exercises Other Exercises: performed multiple standing balance activities with minimal and faded b/l UE assist.  EO/EC with/without perterbations, heel raises, SLS and (unsuccessfully) tandem standing    General Comments        Pertinent Vitals/Pain Pain Assessment: No/denies pain    Home Living                      Prior Function            PT Goals (current goals  can now be found in the care plan section) Progress towards PT goals: Progressing toward goals    Frequency    7X/week      PT Plan Current plan remains appropriate    Co-evaluation              AM-PAC PT "6 Clicks" Mobility   Outcome Measure  Help needed turning from your back to your side while in a flat bed without using bedrails?: None Help needed moving from lying on your back to sitting on the side of a flat bed without using bedrails?: None Help needed moving to and from a bed to a chair (including a wheelchair)?: None Help needed standing up from a chair using your arms (e.g., wheelchair or bedside chair)?: A Little Help needed to walk in hospital room?: A Little   6 Click Score:  18    End of Session Equipment Utilized During Treatment: Oxygen;Gait belt Activity Tolerance: Patient limited by fatigue Patient left: in bed;with call bell/phone within reach;with bed alarm set Nurse Communication: Mobility status PT Visit Diagnosis: Unsteadiness on feet (R26.81);Muscle weakness (generalized) (M62.81);Difficulty in walking, not elsewhere classified (R26.2)     Time: 1411-1440 PT Time Calculation (min) (ACUTE ONLY): 29 min  Charges:  $Gait Training: 8-22 mins $Neuromuscular Re-education: 8-22 mins                     Kreg Shropshire, DPT 03/10/2018, 5:37 PM

## 2018-03-10 NOTE — Progress Notes (Signed)
Patient refused to do NIH scale etc. Patient refused to speak with and work with several staff members from various specialities.  They said they would come back such as speech, occupational etc.  Patient accepted neurologist instructions but then later refused other instructions from other care providers.  At the end of the shift, patient was more altert and awake and said he was sorry for "any behaviors" that he was not feeling well and was not himself.  Patient was not able to understand any education during the shift.    Phillis Knack, RN

## 2018-03-10 NOTE — Plan of Care (Signed)
  Problem: Education: Goal: Knowledge of General Education information will improve Description Including pain rating scale, medication(s)/side effects and non-pharmacologic comfort measures Outcome: Progressing   Problem: Education: Goal: Knowledge of disease or condition will improve Outcome: Progressing Goal: Knowledge of secondary prevention will improve Outcome: Progressing Goal: Knowledge of patient specific risk factors addressed and post discharge goals established will improve Outcome: Progressing   Problem: Ischemic Stroke/TIA Tissue Perfusion: Goal: Complications of ischemic stroke/TIA will be minimized Outcome: Progressing

## 2018-03-10 NOTE — Progress Notes (Signed)
Subjective: Sinus tach this AM, confusion persists.    Past Medical History:  Diagnosis Date  . Anxiety   . Arthritis   . Atrial septal defect   . CHF (congestive heart failure) ([place])   . COPD (chronic obstructive pulmonary disease) ([place])   . Coronary artery disease   . Dysrhythmias   . GERD (gastroesophageal reflux disease)   . Hyperlipidemia   . Multiple myeloma ([place])   . Multiple myeloma ([place])   . Myocardial infarction ([place])   . Substance abuse [facility])     Past Surgical History:  Procedure Laterality Date  . CARDIAC DEFIBRILLATOR PLACEMENT      Family History  Problem Relation Age of Onset  . COPD Mother   . CAD Mother   . Heart attack Father   . Prostate cancer Maternal Grandfather   . Kidney cancer Neg Hx   . Bladder Cancer Neg Hx    Social History:  reports that he has been smoking cigarettes. He has a 20.00 pack-year smoking history. He has never used smokeless tobacco. He reports that he does not drink alcohol or use drugs.  Allergies: No Known Allergies  Medications:  I have reviewed the patient's current medications. Prior to Admission:  Medications Prior to Admission  Medication Sig Dispense Refill Last Dose  . acetaminophen (TYLENOL) 325 MG tablet Take 2 tablets (650 mg total) by mouth every 6 (six) hours as needed for mild pain (or Fever >/= 101).   prn at prn  . albuterol (PROVENTIL HFA;VENTOLIN HFA) 108 (90 Base) MCG/ACT inhaler Inhale 2 puffs into the lungs every 6 (six) hours as needed for wheezing or shortness of breath. 1 Inhaler 6 prn at prn  . ALPRAZolam (XANAX) 0.5 MG tablet Take 1 tablet (0.5 mg total) by mouth 3 (three) times daily as needed for anxiety. 90 tablet 1 03/07/2018 at 1400  . aspirin 325 MG EC tablet Take 325 mg by mouth daily.   03/07/2018 at 0930  . atorvastatin (LIPITOR) 20 MG tablet Take 20 mg by mouth daily.  2 03/07/2018 at 0930  . dexamethasone (DECADRON) 4 MG tablet TAKE 3 TABLETS (12 MG TOTAL) BY MOUTH ONCE A WEEK. TAKE WITH  BREAKFAST 12 tablet 4 Past Week at Unknown time  . DULoxetine (CYMBALTA) 30 MG capsule TAKE ONE CAPSULE BY MOUTH EVERY DAY 90 capsule 1 03/07/2018 at 0930  . fentaNYL (DURAGESIC) 100 MCG/HR Place 1 patch onto the skin every other day. Use with a 25 mcg patch for total dose of 125 mcg 15 patch 0 03/06/2018 at 0930  . fentaNYL (DURAGESIC) 25 MCG/HR Place 1 patch onto the skin every other day. Use with a 100 mcg patch for total dose of 125 mcg 15 patch 0 03/06/2018 at 0930  . Fluticasone-Salmeterol (ADVAIR DISKUS) 250-50 MCG/DOSE AEPB Inhale 1 puff into the lungs 2 (two) times daily. 3 each 2 03/07/2018 at 0930  . gabapentin (NEURONTIN) 100 MG capsule Take 2 capsules (200 mg total) by mouth 3 (three) times daily. 180 capsule 3 03/07/2018 at 1400  . HYDROmorphone (DILAUDID) 2 MG tablet Take 1 tablet (2 mg total) by mouth every 6 (six) hours as needed for severe pain. 56 tablet 0 03/07/2018 at 1400  . ipratropium-albuterol (DUONEB) 0.5-2.5 (3) MG/3ML SOLN Take 3 mLs by nebulization every 4 (four) hours as needed. 360 mL 3 prn at prn  . megestrol (MEGACE) 20 MG tablet TAKE ONE TABLET BY MOUTH ONCE DAILY 30 tablet 2 03/07/2018 at 0930  . metoprolol tartrate (  LOPRESSOR) 50 MG tablet Take 50 mg by mouth 2 (two) times daily.  2 03/07/2018 at 0930  . naloxone [facility]) nasal spray 4 mg/0.1 mL For Opioid Overdose: Call 911 and administer a single spray of Narcan in one nostril. Repeat every 43mns as needed if no or minimal response 1 kit 2   . NINLARO 4 MG capsule TAKE 1 CAPSULE (4 MG TOTAL) BY MOUTH ONCE A WEEK. TAKE FOR 3 WEEKS ON, THEN 1 WEEK OFF. TAKE ON EMPTY STOMACH 1HR BEFORE OR 2HRS AFTER FOOD. 3 capsule 4 Past Month at Unknown time  . ondansetron (ZOFRAN) 8 MG tablet 1 pill every 8 hours as needed. 40 tablet 0 prn at prn  . tamsulosin (FLOMAX) 0.4 MG CAPS capsule Take 1 capsule (0.4 mg total) by mouth daily. 30 capsule 0 03/07/2018 at 0930  . tiotropium (SPIRIVA HANDIHALER) 18 MCG inhalation capsule Place 1 capsule  (18 mcg total) into inhaler and inhale daily. 30 capsule 6 03/07/2018 at 0930   Prior to Admission medications   Medication Sig Start Date End Date Taking? Authorizing Provider  acetaminophen (TYLENOL) 325 MG tablet Take 2 tablets (650 mg total) by mouth every 6 (six) hours as needed for mild pain (or Fever >/= 101). 05/28/16  Yes Gouru, AIllene Silver MD  albuterol (PROVENTIL HFA;VENTOLIN HFA) 108 (90 Base) MCG/ACT inhaler Inhale 2 puffs into the lungs every 6 (six) hours as needed for wheezing or shortness of breath. 04/09/17  Yes KFlora Lipps MD  ALPRAZolam (Duanne Moron 0.5 MG tablet Take 1 tablet (0.5 mg total) by mouth 3 (three) times daily as needed for anxiety. 02/11/18  Yes BCammie Sickle MD  aspirin 325 MG EC tablet Take 325 mg by mouth daily.   Yes [provider]  atorvastatin (LIPITOR) 20 MG tablet Take 20 mg by mouth daily. 10/11/16  Yes [provider]  dexamethasone (DECADRON) 4 MG tablet TAKE 3 TABLETS (12 MG TOTAL) BY MOUTH ONCE A WEEK. TAKE WITH BREAKFAST 01/18/18  Yes FLloyd Huger MD  DULoxetine (CYMBALTA) 30 MG capsule TAKE ONE CAPSULE BY MOUTH EVERY DAY 11/21/17  Yes BCammie Sickle MD  fentaNYL (DURAGESIC) 100 MCG/HR Place 1 patch onto the skin every other day. Use with a 25 mcg patch for total dose of 125 mcg 02/21/18  Yes BCammie Sickle MD  fentaNYL (DURAGESIC) 25 MCG/HR Place 1 patch onto the skin every other day. Use with a 100 mcg patch for total dose of 125 mcg 02/21/18  Yes BCammie Sickle MD  Fluticasone-Salmeterol (ADVAIR DISKUS) 250-50 MCG/DOSE AEPB Inhale 1 puff into the lungs 2 (two) times daily. 07/20/17 07/20/18 Yes Kasa, KMaretta Bees MD  gabapentin (NEURONTIN) 100 MG capsule Take 2 capsules (200 mg total) by mouth 3 (three) times daily. 11/27/17  Yes BCammie Sickle MD  HYDROmorphone (DILAUDID) 2 MG tablet Take 1 tablet (2 mg total) by mouth every 6 (six) hours as needed for severe pain. 03/07/18  Yes BCammie Sickle MD   ipratropium-albuterol (DUONEB) 0.5-2.5 (3) MG/3ML SOLN Take 3 mLs by nebulization every 4 (four) hours as needed. 04/09/17  Yes KFlora Lipps MD  megestrol (MEGACE) 20 MG tablet TAKE ONE TABLET BY MOUTH ONCE DAILY 02/26/18  Yes BCammie Sickle MD  metoprolol tartrate (LOPRESSOR) 50 MG tablet Take 50 mg by mouth 2 (two) times daily. 08/21/16  Yes [provider]  naloxone (NARCAN) nasal spray 4 mg/0.1 mL For Opioid Overdose: Call 911 and administer a single spray of Narcan in one nostril.  Repeat every 38mns as needed if no or minimal response 02/20/18  Yes Brahmanday, GElisha Headland MD  NINLARO 4 MG capsule TAKE 1 CAPSULE (4 MG TOTAL) BY MOUTH ONCE A WEEK. TAKE FOR 3 WEEKS ON, THEN 1 WEEK OFF. TAKE ON EMPTY STOMACH 1HR BEFORE OR 2HRS AFTER FOOD. 01/18/18  Yes FLloyd Huger MD  ondansetron (ZOFRAN) 8 MG tablet 1 pill every 8 hours as needed. 10/31/17  Yes BCammie Sickle MD  tamsulosin (FLOMAX) 0.4 MG CAPS capsule Take 1 capsule (0.4 mg total) by mouth daily. 02/18/18  Yes PDustin Flock MD  tiotropium (SPIRIVA HANDIHALER) 18 MCG inhalation capsule Place 1 capsule (18 mcg total) into inhaler and inhale daily. 04/09/17 04/09/18 Yes KFlora Lipps MD    Scheduled: . aspirin  325 mg Oral Daily  . atorvastatin  40 mg Oral q1800  . digoxin  0.25 mg Oral Daily  . DULoxetine  30 mg Oral Daily  . enoxaparin (LOVENOX) injection  40 mg Subcutaneous Q24H  . fentaNYL  1 patch Transdermal Q48H  . fentaNYL  1 patch Transdermal Q48H  . fluticasone furoate-vilanterol  1 puff Inhalation Daily  . megestrol  20 mg Oral Daily  . metoprolol tartrate  50 mg Oral BID  . nicotine  14 mg Transdermal Daily  . tamsulosin  0.4 mg Oral Daily  . tiotropium  18 mcg Inhalation Daily    ROS: Unable to obtain from patient due to current altered mental status  Physical Examination: Blood pressure 102/70, pulse (!) 117, temperature 98.1 F (36.7 C), temperature source Oral, resp. rate 18, height 6'  (1.829 m), weight 56.7 kg, SpO2 92 %.   HEENT-  Normocephalic, no lesions, without obvious abnormality.  Normal external eye and conjunctiva.  Normal TM's bilaterally.  Normal auditory canals and external ears. Normal external nose, mucus membranes and septum.  Normal pharynx. Cardiovascular- S1, S2 normal, pulses palpable throughout   Lungs- chest clear, no wheezing, rales, normal symmetric air entry Abdomen- soft, non-tender; bowel sounds normal; no masses,  no organomegaly Extremities- no edema Lymph-no adenopathy palpable Musculoskeletal-no joint tenderness, deformity or swelling Skin-warm and dry, no hyperpigmentation, vitiligo, or suspicious lesions  Neurological Exam   Mental Status: Alert, oriented to person but not to place or time, thought content mildly inappropriate.  Able to identify fiancee by name and relationship to him. Speech fluent without evidence of aphasia.  Able to follow simple commands but with some difficulty. Requires prompting and cueing. Attention span and concentration seemed mildly impaired.  Cranial Nerves: II: Discs flat bilaterally; Visual fields grossly normal, pupils equal, round, reactive to light and accommodation III,IV, VI: ptosis not present, extra-ocular motions intact bilaterally V,VII: smile symmetric, facial light touch sensation intact VIII: hearing normal bilaterally IX,X: gag reflex present XI: bilateral shoulder shrug XII: midline tongue extension Motor: Right :  Upper extremity   5/5 Without pronator drift      Left: Upper extremity   5/5 without pronator drift Right:   Lower extremity   5/5                                          Left: Lower extremity   5/5 Tone and bulk:normal tone throughout; no atrophy noted Sensory: Pinprick and light touch intact bilaterally Deep Tendon Reflexes: 2+ and symmetric throughout Plantars: Right: mute  Left: mute Cerebellar: Finger-to-nose testing intact bilaterally. Heel  to shin testing normal bilaterally Gait: not tested due to safety concerns  Data Reviewed  Laboratory Studies:  Basic Metabolic Panel: Recent Labs  Lab 03/07/18 1620 03/10/18 0409  NA 137 139  K 3.8 3.6  CL 100 108  CO2 30 27  GLUCOSE 95 94  BUN 13 6  CREATININE 0.89 0.54*  CALCIUM 9.0 8.0*  MG  --  1.7  PHOS  --  2.8    Liver Function Tests: Recent Labs  Lab 03/07/18 1620 03/10/18 0409  AST 14* 18  ALT 7 7  ALKPHOS 44 41  BILITOT 0.8 0.8  PROT 6.9 5.7*  ALBUMIN 3.6 2.8*   No results for input(s): LIPASE, AMYLASE in the last 168 hours. Recent Labs  Lab 03/08/18 1454  AMMONIA 15    CBC: Recent Labs  Lab 03/07/18 1620 03/10/18 0409  WBC 12.8* 10.9*  NEUTROABS 10.8* 8.2*  HGB 10.8* 9.6*  HCT 34.8* 30.2*  MCV 89.9 88.8  PLT 209 190    Cardiac Enzymes: No results for input(s): CKTOTAL, CKMB, CKMBINDEX, TROPONINI in the last 168 hours.  BNP: Invalid input(s): POCBNP  CBG: No results for input(s): GLUCAP in the last 168 hours.  Microbiology: Results for orders placed or performed during the hospital encounter of 02/15/18  Gastrointestinal Panel by PCR , Stool     Status: Abnormal   Collection Time: 02/15/18 11:52 AM  Result Value Ref Range Status   Campylobacter species NOT DETECTED NOT DETECTED Final   Plesimonas shigelloides NOT DETECTED NOT DETECTED Final   Salmonella species NOT DETECTED NOT DETECTED Final   Yersinia enterocolitica NOT DETECTED NOT DETECTED Final   Vibrio species NOT DETECTED NOT DETECTED Final   Vibrio cholerae NOT DETECTED NOT DETECTED Final   Enteroaggregative E coli (EAEC) NOT DETECTED NOT DETECTED Final   Enteropathogenic E coli (EPEC) NOT DETECTED NOT DETECTED Final   Enterotoxigenic E coli (ETEC) NOT DETECTED NOT DETECTED Final   Shiga like toxin producing E coli (STEC) NOT DETECTED NOT DETECTED Final   Shigella/Enteroinvasive E coli (EIEC) NOT DETECTED NOT DETECTED Final   Cryptosporidium NOT DETECTED NOT DETECTED  Final   Cyclospora cayetanensis NOT DETECTED NOT DETECTED Final   Entamoeba histolytica NOT DETECTED NOT DETECTED Final   Giardia lamblia NOT DETECTED NOT DETECTED Final   Adenovirus F40/41 NOT DETECTED NOT DETECTED Final   Astrovirus NOT DETECTED NOT DETECTED Final   Norovirus GI/GII NOT DETECTED NOT DETECTED Final   Rotavirus A NOT DETECTED NOT DETECTED Final   Sapovirus (I, II, IV, and V) DETECTED (A) NOT DETECTED Final    Comment: Performed at [facility], [address] [place], [place] [address]  C difficile quick scan w PCR reflex     Status: None   Collection Time: 02/15/18 11:52 AM  Result Value Ref Range Status   C Diff antigen NEGATIVE NEGATIVE Final   C Diff toxin NEGATIVE NEGATIVE Final   C Diff interpretation No C. difficile detected.  Final    Comment: Performed at [facility], [address] [place], [place] [address]  Blood culture (routine x 2)     Status: None   Collection Time: 02/15/18  2:52 PM  Result Value Ref Range Status   Specimen Description BLOOD BLOOD RIGHT FOREARM  Final   Special Requests   Final    BOTTLES DRAWN AEROBIC AND ANAEROBIC Blood Culture results may not be optimal due to an excessive volume of  blood received in culture bottles   Culture   Final    NO GROWTH 5 DAYS Performed at [facility], [address] [place], [place] [address]    Report Status 02/20/2018 FINAL  Final  Blood culture (routine x 2)     Status: None   Collection Time: 02/15/18  2:54 PM  Result Value Ref Range Status   Specimen Description BLOOD BLOOD RIGHT WRIST  Final   Special Requests   Final    BOTTLES DRAWN AEROBIC AND ANAEROBIC Blood Culture adequate volume   Culture   Final    NO GROWTH 5 DAYS Performed at [facility], [address], [place], [place] [address]    Report Status 02/20/2018 FINAL  Final    Coagulation Studies: No results for input(s): LABPROT, INR in the last 72 hours.  Urinalysis:  Recent Labs   Lab 03/07/18 2201  COLORURINE YELLOW*  LABSPEC 1.018  PHURINE 6.0  GLUCOSEU NEGATIVE  HGBUR NEGATIVE  BILIRUBINUR NEGATIVE  KETONESUR NEGATIVE  PROTEINUR NEGATIVE  NITRITE NEGATIVE  LEUKOCYTESUR NEGATIVE    Lipid Panel:    Component Value Date/Time   CHOL 84 03/09/2018 0407   CHOL 135 05/30/2012 0125   TRIG 47 03/09/2018 0407   TRIG 73 05/30/2012 0125   HDL 31 (L) 03/09/2018 0407   HDL 39 (L) 05/30/2012 0125   CHOLHDL 2.7 03/09/2018 0407   VLDL 9 03/09/2018 0407   VLDL 15 05/30/2012 0125   LDLCALC 44 03/09/2018 0407   LDLCALC 81 05/30/2012 0125    HgbA1C:  Lab Results  Component Value Date   HGBA1C 5.4 12/23/2014    Urine Drug Screen:      Component Value Date/Time   LABOPIA POSITIVE (A) 03/07/2018 2201   COCAINSCRNUR NONE DETECTED 03/07/2018 2201   LABBENZ POSITIVE (A) 03/07/2018 2201   AMPHETMU NONE DETECTED 03/07/2018 2201   THCU POSITIVE (A) 03/07/2018 2201   LABBARB NONE DETECTED 03/07/2018 2201    Alcohol Level: No results for input(s): ETH in the last 168 hours.  Other results: EKG: brief SVT yesterday   Imaging: Ct Angio Head W Or Wo Contrast  Result Date: 03/09/2018 CLINICAL DATA:  Altered mental status. Possible right frontal lobe infarct on CT. EXAM: CT ANGIOGRAPHY HEAD AND NECK TECHNIQUE: Multidetector CT imaging of the head and neck was performed using the standard protocol during bolus administration of intravenous contrast. Multiplanar CT image reconstructions and MIPs were obtained to evaluate the vascular anatomy. Carotid stenosis measurements (when applicable) are obtained utilizing NASCET criteria, using the distal internal carotid diameter as the denominator. CONTRAST:  54m OMNIPAQUE IOHEXOL 350 MG/ML SOLN COMPARISON:  Head CT 03/08/2018. Carotid Doppler ultrasound 05/29/2012. FINDINGS: CT HEAD FINDINGS Brain: No definite acute infarct, intracranial hemorrhage, mass, midline shift, or extra-axial fluid collection is identified. The focal  region of hypodensity in the right frontal lobe described on yesterday's CT is not clearly persistent on today's study and may have reflected volume averaging through a sulcus. There is mild cerebral atrophy. Scattered cerebral white matter hypodensities are nonspecific but compatible with mild chronic small vessel ischemic disease. Vascular: Calcified atherosclerosis at the skull base. No hyperdense vessel. Skull: No fracture or focal osseous lesion. Sinuses: Right maxillary sinus mucous retention cyst. Clear mastoid air cells. Orbits: Unremarkable. Review of the MIP images confirms the above findings CTA NECK FINDINGS Aortic arch: Standard 3 vessel aortic arch with widely patent arch vessel origins. Right carotid system: Patent with mild calcified plaque at the carotid bifurcation. No  evidence of significant stenosis or dissection. Left carotid system: Patent with moderate calcified plaque at the carotid bifurcation. No evidence of significant stenosis or dissection. Tortuous distal cervical ICA. Vertebral arteries: Patent without evidence of significant stenosis or dissection. Moderately dominant left vertebral artery. Skeleton: Poor dentition with multiple dental caries and missing teeth. Other neck: No evidence of acute abnormality or mass. Upper chest: Emphysema. Review of the MIP images confirms the above findings CTA HEAD FINDINGS Anterior circulation: The internal carotid arteries are patent from skull base to carotid termini with nonstenotic plaque bilaterally. ACAs and MCAs are patent without evidence of proximal branch occlusion or significant proximal stenosis. No aneurysm is identified. Posterior circulation: The intracranial vertebral arteries are patent to the basilar. Patent PICA and SCA origins are identified bilaterally. The basilar artery is patent with a mild stenosis in its midportion. There are moderately large posterior communicating arteries bilaterally with hypoplasia of the P1 segments. No  flow limiting proximal PCA stenosis is evident. No aneurysm is identified. Venous sinuses: Patent. Anatomic variants: Predominantly fetal origin of the PCAs. Delayed phase: No abnormal enhancement. Review of the MIP images confirms the above findings IMPRESSION: 1. Intracranial atherosclerosis without major branch occlusion or flow limiting proximal stenosis. 2. Widely patent cervical carotid and vertebral arteries. 3. No evidence of acute infarct or hemorrhage on noncontrast head CT. 4.  Emphysema (ICD10-J43.9). Electronically Signed   By: Logan Bores M.D.   On: 03/09/2018 14:06   Ct Angio Neck W Or Wo Contrast  Result Date: 03/09/2018 CLINICAL DATA:  Altered mental status. Possible right frontal lobe infarct on CT. EXAM: CT ANGIOGRAPHY HEAD AND NECK TECHNIQUE: Multidetector CT imaging of the head and neck was performed using the standard protocol during bolus administration of intravenous contrast. Multiplanar CT image reconstructions and MIPs were obtained to evaluate the vascular anatomy. Carotid stenosis measurements (when applicable) are obtained utilizing NASCET criteria, using the distal internal carotid diameter as the denominator. CONTRAST:  25m OMNIPAQUE IOHEXOL 350 MG/ML SOLN COMPARISON:  Head CT 03/08/2018. Carotid Doppler ultrasound 05/29/2012. FINDINGS: CT HEAD FINDINGS Brain: No definite acute infarct, intracranial hemorrhage, mass, midline shift, or extra-axial fluid collection is identified. The focal region of hypodensity in the right frontal lobe described on yesterday's CT is not clearly persistent on today's study and may have reflected volume averaging through a sulcus. There is mild cerebral atrophy. Scattered cerebral white matter hypodensities are nonspecific but compatible with mild chronic small vessel ischemic disease. Vascular: Calcified atherosclerosis at the skull base. No hyperdense vessel. Skull: No fracture or focal osseous lesion. Sinuses: Right maxillary sinus mucous  retention cyst. Clear mastoid air cells. Orbits: Unremarkable. Review of the MIP images confirms the above findings CTA NECK FINDINGS Aortic arch: Standard 3 vessel aortic arch with widely patent arch vessel origins. Right carotid system: Patent with mild calcified plaque at the carotid bifurcation. No evidence of significant stenosis or dissection. Left carotid system: Patent with moderate calcified plaque at the carotid bifurcation. No evidence of significant stenosis or dissection. Tortuous distal cervical ICA. Vertebral arteries: Patent without evidence of significant stenosis or dissection. Moderately dominant left vertebral artery. Skeleton: Poor dentition with multiple dental caries and missing teeth. Other neck: No evidence of acute abnormality or mass. Upper chest: Emphysema. Review of the MIP images confirms the above findings CTA HEAD FINDINGS Anterior circulation: The internal carotid arteries are patent from skull base to carotid termini with nonstenotic plaque bilaterally. ACAs and MCAs are patent without evidence of proximal branch occlusion or significant  proximal stenosis. No aneurysm is identified. Posterior circulation: The intracranial vertebral arteries are patent to the basilar. Patent PICA and SCA origins are identified bilaterally. The basilar artery is patent with a mild stenosis in its midportion. There are moderately large posterior communicating arteries bilaterally with hypoplasia of the P1 segments. No flow limiting proximal PCA stenosis is evident. No aneurysm is identified. Venous sinuses: Patent. Anatomic variants: Predominantly fetal origin of the PCAs. Delayed phase: No abnormal enhancement. Review of the MIP images confirms the above findings IMPRESSION: 1. Intracranial atherosclerosis without major branch occlusion or flow limiting proximal stenosis. 2. Widely patent cervical carotid and vertebral arteries. 3. No evidence of acute infarct or hemorrhage on noncontrast head CT. 4.   Emphysema (ICD10-J43.9). Electronically Signed   By: Logan Bores M.D.   On: 03/09/2018 14:06   US Carotid Bilateral (at [facility] Only)  Result Date: 03/09/2018 CLINICAL DATA:  CVA.  History of CAD, hyperlipidemia and smoking. EXAM: BILATERAL CAROTID DUPLEX ULTRASOUND TECHNIQUE: Pearline Cables scale imaging, color Doppler and duplex ultrasound were performed of bilateral carotid and vertebral arteries in the neck. COMPARISON:  None. FINDINGS: Criteria: Quantification of carotid stenosis is based on velocity parameters that correlate the residual internal carotid diameter with NASCET-based stenosis levels, using the diameter of the distal internal carotid lumen as the denominator for stenosis measurement. The following velocity measurements were obtained: RIGHT ICA: 112/29 cm/sec CCA: 38/75 cm/sec SYSTOLIC ICA/CCA RATIO:  1.1 ECA: 221 cm/sec LEFT ICA: 102/36 cm/sec CCA: 64/33 cm/sec SYSTOLIC ICA/CCA RATIO:  1.2 ECA: 104 cm/sec RIGHT CAROTID ARTERY: There is a moderate amount of eccentric echogenic plaque within the right carotid bulb (image 16), not resulting in elevated peak systolic velocities within the interrogated course the right internal carotid artery to suggest a hemodynamically significant stenosis. RIGHT VERTEBRAL ARTERY:  Antegrade Flow LEFT CAROTID ARTERY: There is a moderate to large amount of eccentric mixed echogenic plaque within the left carotid bulb (images 48 and 49), extending to involve the origin and proximal aspects of the left internal carotid artery (image 56), not resulting in elevated peak systolic velocities within the interrogated course of the left internal carotid artery to suggest a hemodynamically significant stenosis. LEFT VERTEBRAL ARTERY:  Antegrade Flow Note is made of an apparent cardiac arrhythmia. IMPRESSION: 1. Moderate to large amount of bilateral atherosclerotic plaque, left greater than right, not resulting in a hemodynamically significant stenosis within either internal  carotid artery. 2. Apparent cardiac arrhythmia. Further evaluation with ECG monitoring could be performed as clinically indicated. Electronically Signed   By: Sandi Mariscal M.D.   On: 03/09/2018 09:59   Assessment: 58 y.o. male ith past medical history of multiple myeloma, myocardial infarction, COPD on chronic oxygen, CHF, polysubstance abuse, hyperlipidemia, CAD, defibrillator placement, and hyperthyroidism presenting to the ED on 03/07/2018 under IVC for altered mental status associated with behavioral disturbances. A non-contrast head CT showed decreased attenuation in the posterior mid right frontal lobe questionable infarct in this area which maybe contributing to change in mental status. However cannot rule out other etiology given hx of multiple myeloma.  Unable to obtain MRI brain due to presence of defibrillator. Patient was on Aspirin 81 mg prior to this event. Further work up pending.  Stroke Risk Factors - carotid stenosis, family history, hyperlipidemia, hypertension and smoking  Plan:  - CTA no large vessel stenosis/occlusion - multiple myeloma as per primary team - unable to obtain MRI due to PPM - Pt/ot   03/10/2018, 11:28 AM

## 2018-03-10 NOTE — Progress Notes (Signed)
Pt has been running Sinus Tach on tele HR100-120's, even after receiving metoprolol this morning. Dr. Humphrey Rolls notified and received verbal orders for Digoxin 0.25mg . Will continue to monitor.

## 2018-03-10 NOTE — Progress Notes (Signed)
[facility] [place] NAME: [name]    MR#:  [id number]  DATE OF BIRTH:  1960-03-22  SUBJECTIVE:  CHIEF COMPLAINT:   Chief Complaint  Patient presents with  . Hallucinations   Awake and alert.  Girlfriend at bedside.  Continues to have tachycardia.  REVIEW OF SYSTEMS:    Review of Systems  Constitutional: Positive for malaise/fatigue. Negative for chills and fever.  HENT: Negative for sore throat.   Eyes: Negative for blurred vision, double vision and pain.  Respiratory: Negative for cough, hemoptysis, shortness of breath and wheezing.   Cardiovascular: Negative for chest pain, palpitations, orthopnea and leg swelling.  Gastrointestinal: Negative for abdominal pain, constipation, diarrhea, heartburn, nausea and vomiting.  Genitourinary: Negative for dysuria and hematuria.  Musculoskeletal: Negative for back pain and joint pain.  Skin: Negative for rash.  Neurological: Negative for sensory change, speech change, focal weakness and headaches.  Endo/Heme/Allergies: Does not bruise/bleed easily.  Psychiatric/Behavioral: Negative for depression. The patient is not nervous/anxious.     DRUG ALLERGIES:  No Known Allergies  VITALS:  Blood pressure 102/70, pulse (!) 117, temperature 98.1 F (36.7 C), temperature source Oral, resp. rate 18, height 6' (1.829 m), weight 56.7 kg, SpO2 92 %.  PHYSICAL EXAMINATION:   Physical Exam  GENERAL:  58 y.o.-year-old patient lying in the bed with no acute distress.  EYES: Pupils equal, round, reactive to light and accommodation. No scleral icterus. Extraocular muscles intact.  HEENT: Head atraumatic, normocephalic. Oropharynx and nasopharynx clear.  NECK:  Supple, no jugular venous distention. No thyroid enlargement, no tenderness.  LUNGS: Normal breath sounds bilaterally, no wheezing, rales, rhonchi. No use of accessory muscles of respiration.  CARDIOVASCULAR: S1, S2 .  Tachycardia ABDOMEN:  Soft, nontender, nondistended. Bowel sounds present. No organomegaly or mass.  EXTREMITIES: No cyanosis, clubbing or edema b/l.    NEUROLOGIC: Cranial nerves II through XII are intact. No focal Motor or sensory deficits b/l.   PSYCHIATRIC: The patient is awake and alert SKIN: No obvious rash, lesion, or ulcer.   LABORATORY PANEL:   CBC Recent Labs  Lab 03/10/18 0409  WBC 10.9*  HGB 9.6*  HCT 30.2*  PLT 190   ------------------------------------------------------------------------------------------------------------------ Chemistries  Recent Labs  Lab 03/10/18 0409  NA 139  K 3.6  CL 108  CO2 27  GLUCOSE 94  BUN 6  CREATININE 0.54*  CALCIUM 8.0*  MG 1.7  AST 18  ALT 7  ALKPHOS 41  BILITOT 0.8   ------------------------------------------------------------------------------------------------------------------  Cardiac Enzymes No results for input(s): TROPONINI in the last 168 hours. ------------------------------------------------------------------------------------------------------------------  RADIOLOGY:  Ct Angio Head W Or Wo Contrast  Result Date: 03/09/2018 CLINICAL DATA:  Altered mental status. Possible right frontal lobe infarct on CT. EXAM: CT ANGIOGRAPHY HEAD AND NECK TECHNIQUE: Multidetector CT imaging of the head and neck was performed using the standard protocol during bolus administration of intravenous contrast. Multiplanar CT image reconstructions and MIPs were obtained to evaluate the vascular anatomy. Carotid stenosis measurements (when applicable) are obtained utilizing NASCET criteria, using the distal internal carotid diameter as the denominator. CONTRAST:  80m OMNIPAQUE IOHEXOL 350 MG/ML SOLN COMPARISON:  Head CT 03/08/2018. Carotid Doppler ultrasound 05/29/2012. FINDINGS: CT HEAD FINDINGS Brain: No definite acute infarct, intracranial hemorrhage, mass, midline shift, or extra-axial fluid collection is identified. The focal region of hypodensity in the  right frontal lobe described on yesterday's CT is not clearly persistent on today's study and may have reflected volume averaging through a sulcus.  There is mild cerebral atrophy. Scattered cerebral white matter hypodensities are nonspecific but compatible with mild chronic small vessel ischemic disease. Vascular: Calcified atherosclerosis at the skull base. No hyperdense vessel. Skull: No fracture or focal osseous lesion. Sinuses: Right maxillary sinus mucous retention cyst. Clear mastoid air cells. Orbits: Unremarkable. Review of the MIP images confirms the above findings CTA NECK FINDINGS Aortic arch: Standard 3 vessel aortic arch with widely patent arch vessel origins. Right carotid system: Patent with mild calcified plaque at the carotid bifurcation. No evidence of significant stenosis or dissection. Left carotid system: Patent with moderate calcified plaque at the carotid bifurcation. No evidence of significant stenosis or dissection. Tortuous distal cervical ICA. Vertebral arteries: Patent without evidence of significant stenosis or dissection. Moderately dominant left vertebral artery. Skeleton: Poor dentition with multiple dental caries and missing teeth. Other neck: No evidence of acute abnormality or mass. Upper chest: Emphysema. Review of the MIP images confirms the above findings CTA HEAD FINDINGS Anterior circulation: The internal carotid arteries are patent from skull base to carotid termini with nonstenotic plaque bilaterally. ACAs and MCAs are patent without evidence of proximal branch occlusion or significant proximal stenosis. No aneurysm is identified. Posterior circulation: The intracranial vertebral arteries are patent to the basilar. Patent PICA and SCA origins are identified bilaterally. The basilar artery is patent with a mild stenosis in its midportion. There are moderately large posterior communicating arteries bilaterally with hypoplasia of the P1 segments. No flow limiting proximal PCA  stenosis is evident. No aneurysm is identified. Venous sinuses: Patent. Anatomic variants: Predominantly fetal origin of the PCAs. Delayed phase: No abnormal enhancement. Review of the MIP images confirms the above findings IMPRESSION: 1. Intracranial atherosclerosis without major branch occlusion or flow limiting proximal stenosis. 2. Widely patent cervical carotid and vertebral arteries. 3. No evidence of acute infarct or hemorrhage on noncontrast head CT. 4.  Emphysema (ICD10-J43.9). Electronically Signed   By: Logan Bores M.D.   On: 03/09/2018 14:06   Ct Angio Neck W Or Wo Contrast  Result Date: 03/09/2018 CLINICAL DATA:  Altered mental status. Possible right frontal lobe infarct on CT. EXAM: CT ANGIOGRAPHY HEAD AND NECK TECHNIQUE: Multidetector CT imaging of the head and neck was performed using the standard protocol during bolus administration of intravenous contrast. Multiplanar CT image reconstructions and MIPs were obtained to evaluate the vascular anatomy. Carotid stenosis measurements (when applicable) are obtained utilizing NASCET criteria, using the distal internal carotid diameter as the denominator. CONTRAST:  32m OMNIPAQUE IOHEXOL 350 MG/ML SOLN COMPARISON:  Head CT 03/08/2018. Carotid Doppler ultrasound 05/29/2012. FINDINGS: CT HEAD FINDINGS Brain: No definite acute infarct, intracranial hemorrhage, mass, midline shift, or extra-axial fluid collection is identified. The focal region of hypodensity in the right frontal lobe described on yesterday's CT is not clearly persistent on today's study and may have reflected volume averaging through a sulcus. There is mild cerebral atrophy. Scattered cerebral white matter hypodensities are nonspecific but compatible with mild chronic small vessel ischemic disease. Vascular: Calcified atherosclerosis at the skull base. No hyperdense vessel. Skull: No fracture or focal osseous lesion. Sinuses: Right maxillary sinus mucous retention cyst. Clear mastoid air  cells. Orbits: Unremarkable. Review of the MIP images confirms the above findings CTA NECK FINDINGS Aortic arch: Standard 3 vessel aortic arch with widely patent arch vessel origins. Right carotid system: Patent with mild calcified plaque at the carotid bifurcation. No evidence of significant stenosis or dissection. Left carotid system: Patent with moderate calcified plaque at the carotid bifurcation. No evidence  of significant stenosis or dissection. Tortuous distal cervical ICA. Vertebral arteries: Patent without evidence of significant stenosis or dissection. Moderately dominant left vertebral artery. Skeleton: Poor dentition with multiple dental caries and missing teeth. Other neck: No evidence of acute abnormality or mass. Upper chest: Emphysema. Review of the MIP images confirms the above findings CTA HEAD FINDINGS Anterior circulation: The internal carotid arteries are patent from skull base to carotid termini with nonstenotic plaque bilaterally. ACAs and MCAs are patent without evidence of proximal branch occlusion or significant proximal stenosis. No aneurysm is identified. Posterior circulation: The intracranial vertebral arteries are patent to the basilar. Patent PICA and SCA origins are identified bilaterally. The basilar artery is patent with a mild stenosis in its midportion. There are moderately large posterior communicating arteries bilaterally with hypoplasia of the P1 segments. No flow limiting proximal PCA stenosis is evident. No aneurysm is identified. Venous sinuses: Patent. Anatomic variants: Predominantly fetal origin of the PCAs. Delayed phase: No abnormal enhancement. Review of the MIP images confirms the above findings IMPRESSION: 1. Intracranial atherosclerosis without major branch occlusion or flow limiting proximal stenosis. 2. Widely patent cervical carotid and vertebral arteries. 3. No evidence of acute infarct or hemorrhage on noncontrast head CT. 4.  Emphysema (ICD10-J43.9).  Electronically Signed   By: Logan Bores M.D.   On: 03/09/2018 14:06   US Carotid Bilateral (at [facility] And [facility] Only)  Result Date: 03/09/2018 CLINICAL DATA:  CVA.  History of CAD, hyperlipidemia and smoking. EXAM: BILATERAL CAROTID DUPLEX ULTRASOUND TECHNIQUE: Pearline Cables scale imaging, color Doppler and duplex ultrasound were performed of bilateral carotid and vertebral arteries in the neck. COMPARISON:  None. FINDINGS: Criteria: Quantification of carotid stenosis is based on velocity parameters that correlate the residual internal carotid diameter with NASCET-based stenosis levels, using the diameter of the distal internal carotid lumen as the denominator for stenosis measurement. The following velocity measurements were obtained: RIGHT ICA: 112/29 cm/sec CCA: 54/09 cm/sec SYSTOLIC ICA/CCA RATIO:  1.1 ECA: 221 cm/sec LEFT ICA: 102/36 cm/sec CCA: 81/19 cm/sec SYSTOLIC ICA/CCA RATIO:  1.2 ECA: 104 cm/sec RIGHT CAROTID ARTERY: There is a moderate amount of eccentric echogenic plaque within the right carotid bulb (image 16), not resulting in elevated peak systolic velocities within the interrogated course the right internal carotid artery to suggest a hemodynamically significant stenosis. RIGHT VERTEBRAL ARTERY:  Antegrade Flow LEFT CAROTID ARTERY: There is a moderate to large amount of eccentric mixed echogenic plaque within the left carotid bulb (images 48 and 49), extending to involve the origin and proximal aspects of the left internal carotid artery (image 56), not resulting in elevated peak systolic velocities within the interrogated course of the left internal carotid artery to suggest a hemodynamically significant stenosis. LEFT VERTEBRAL ARTERY:  Antegrade Flow Note is made of an apparent cardiac arrhythmia. IMPRESSION: 1. Moderate to large amount of bilateral atherosclerotic plaque, left greater than right, not resulting in a hemodynamically significant stenosis within either internal carotid artery. 2. Apparent  cardiac arrhythmia. Further evaluation with ECG monitoring could be performed as clinically indicated. Electronically Signed   By: Sandi Mariscal M.D.   On: 03/09/2018 09:59     ASSESSMENT AND PLAN:   *SVT.  Presently with sinus tachycardia.  Received metoprolol.  Borderline blood pressure. IV digoxin stat. Neurology on board.  *Acute CVA of right frontal lobe.  CTA does not show any acute findings. Unable to get MRI due to defibrillator Echocardiogram pending aspirin and statin. - Lovenox for DVT prophylaxis. - PT/OT/Speech consult as needed  per symptoms Discussed with neurology team.    *Acute encephalopathy.  This is improved.  Likely medication related.  *Hypertension.  Hold medications for permissive hypertension  *COPD with chronic respiratory failure.  On 2-3 L oxygen.  No wheezing.  Continue home inhalers and nebulizers as needed  *Multiple myeloma.  Follows outpatient with cancer center.  Discussed with patient's fianc over the phone.  At baseline he carries on normal conversations and able to ambulate.  DVT prophylaxis Lovenox  All the records are reviewed and case discussed with Care Management/Social Worker Management plans discussed with the patient, family and they are in agreement.  CODE STATUS: FULL CODE  DVT Prophylaxis: SCDs  TOTAL CRITICAL CARE TIME TAKING CARE OF THIS PATIENT: 35 minutes.   POSSIBLE D/C IN 1-2 DAYS, DEPENDING ON CLINICAL CONDITION.  Leia Alf An Schnabel M.D on 03/10/2018 at 11:56 AM  Between 7am to 6pm - Pager - [phone]  After 6pm go to [email or web address] - password [email or web address] [facility]  Office  [phone]  CC: Primary care physician; Petra Kuba, MD  Note: This dictation was prepared with Dragon dictation along with smaller phrase technology. Any transcriptional errors that result from this process are unintentional.

## 2018-03-11 ENCOUNTER — Telehealth: Payer: Self-pay | Admitting: *Deleted

## 2018-03-11 ENCOUNTER — Inpatient Hospital Stay
Admit: 2018-03-11 | Discharge: 2018-03-11 | Disposition: A | Discharge status: Home / Self Care | Payer: Medicaid Other | Attending: Internal Medicine | Admitting: Internal Medicine

## 2018-03-11 DIAGNOSIS — I639 Cerebral infarction, unspecified: Principal | ICD-10-CM

## 2018-03-11 LAB — ECHOCARDIOGRAM COMPLETE
Height: 72 in
Weight: 2000.01 oz

## 2018-03-11 LAB — HEMOGLOBIN A1C
Hgb A1c MFr Bld: 5.1 % (ref 4.8–5.6)
Mean Plasma Glucose: 100 mg/dL

## 2018-03-11 LAB — VITAMIN B1: Vitamin B1 (Thiamine): 66.6 nmol/L (ref 66.5–200.0)

## 2018-03-11 LAB — B. BURGDORFI ANTIBODIES: B burgdorferi Ab IgG+IgM: <0.91 {ISR} (ref 0.00–0.90)

## 2018-03-11 MED ORDER — HALOPERIDOL 1 MG PO TABS
1.0000 mg | ORAL_TABLET | Freq: Four times a day (QID) | ORAL | Status: DC | PRN
  Filled 2018-03-11: qty 1

## 2018-03-11 MED ORDER — LORAZEPAM 2 MG/ML IJ SOLN
2.0000 mg | Freq: Once | INTRAMUSCULAR | Status: AC
  Administered 2018-03-11: 2 mg via INTRAVENOUS
  Filled 2018-03-11: qty 1

## 2018-03-11 MED ORDER — ASPIRIN EC 81 MG PO TBEC
81.0000 mg | DELAYED_RELEASE_TABLET | Freq: Every day | ORAL | Status: DC
  Administered 2018-03-12 – 2018-03-13 (×2): 81 mg via ORAL
  Filled 2018-03-11 (×2): qty 1

## 2018-03-11 MED ORDER — CLOPIDOGREL BISULFATE 75 MG PO TABS
75.0000 mg | ORAL_TABLET | Freq: Every day | ORAL | Status: DC
  Administered 2018-03-11 – 2018-03-13 (×3): 75 mg via ORAL
  Filled 2018-03-11 (×3): qty 1

## 2018-03-11 MED ORDER — HALOPERIDOL 5 MG PO TABS
5.0000 mg | ORAL_TABLET | Freq: Every day | ORAL | Status: DC
  Administered 2018-03-11 – 2018-03-12 (×2): 5 mg via ORAL
  Filled 2018-03-11 (×3): qty 1

## 2018-03-11 MED ORDER — ENSURE ENLIVE PO LIQD
237.0000 mL | Freq: Three times a day (TID) | ORAL | Status: DC
  Administered 2018-03-13 (×2): 237 mL via ORAL

## 2018-03-11 MED ORDER — MEGESTROL ACETATE 400 MG/10ML PO SUSP
320.0000 mg | Freq: Two times a day (BID) | ORAL | Status: DC
  Administered 2018-03-11 – 2018-03-13 (×5): 320 mg via ORAL
  Filled 2018-03-11 (×6): qty 10

## 2018-03-11 MED ORDER — DIPHENHYDRAMINE HCL 25 MG PO CAPS
50.0000 mg | ORAL_CAPSULE | Freq: Every day | ORAL | Status: DC
  Administered 2018-03-11 – 2018-03-12 (×2): 50 mg via ORAL
  Filled 2018-03-11 (×2): qty 2

## 2018-03-11 MED ORDER — QUETIAPINE FUMARATE 25 MG PO TABS: 25.0000 mg | ORAL_TABLET | Freq: Every day | ORAL | Status: DC

## 2018-03-11 MED ORDER — ADULT MULTIVITAMIN W/MINERALS CH
1.0000 | ORAL_TABLET | Freq: Every day | ORAL | Status: DC
  Administered 2018-03-12 – 2018-03-13 (×2): 1 via ORAL
  Filled 2018-03-11 (×2): qty 1

## 2018-03-11 NOTE — Progress Notes (Signed)
PT Cancellation Note  Patient Details Name: [name] MRN: [id number] DOB: December 13, 1960   Cancelled Treatment:    Reason Eval/Treat Not Completed: Fatigue/lethargy limiting ability to participate. Pt again soundly sleeping this afternoon; family notes he was awake for a little while earlier in the afternoon and agreeable to attempt awaking. Pt awoken through voice/touch and has extreme difficulty keeping eyes open as well as carrying on a conversation/speak very clearly. Does state he "needs a good sleep" Pt unable to participate in meaningful therapy at this time. Re attempt at a later date.    Larae Grooms, PTA 03/11/2018, 7:48 PM

## 2018-03-11 NOTE — Plan of Care (Signed)
  Problem: Education: Goal: Knowledge of General Education information will improve Description: Including pain rating scale, medication(s)/side effects and non-pharmacologic comfort measures Outcome: Progressing   Problem: Education: Goal: Knowledge of disease or condition will improve Outcome: Progressing Goal: Knowledge of secondary prevention will improve Outcome: Progressing Goal: Knowledge of patient specific risk factors addressed and post discharge goals established will improve Outcome: Progressing   Problem: Coping: Goal: Will identify appropriate support needs Outcome: Progressing   Problem: Health Behavior/Discharge Planning: Goal: Ability to manage health-related needs will improve Outcome: Progressing   Problem: Self-Care: Goal: Ability to participate in self-care as condition permits will improve Outcome: Progressing Goal: Ability to communicate needs accurately will improve Outcome: Progressing   Problem: Nutrition: Goal: Risk of aspiration will decrease Outcome: Progressing   Problem: Ischemic Stroke/TIA Tissue Perfusion: Goal: Complications of ischemic stroke/TIA will be minimized Outcome: Progressing   

## 2018-03-11 NOTE — Progress Notes (Signed)
*  PRELIMINARY RESULTS* Echocardiogram 2D Echocardiogram has been performed.  [name] [name] 03/11/2018, 8:38 AM

## 2018-03-11 NOTE — NC FL2 (Addendum)
[place] LEVEL OF CARE SCREENING TOOL     IDENTIFICATION  Patient Name: [name] Birthdate: April 29, 1960 Sex: male Admission Date (Current Location): 03/07/2018  [place] and [place] Number:  Selena Lesser [id number] [place] and Address:  [facility], [address] [place], [place] [address]      Provider Number: [id number]  Attending Physician Name and Address:  Hillary Bow, MD  Relative Name and Phone Number:  Jackie Plum Daughter   [phone] or Jonah, Gingras   [phone] or Janetta Hora Sister   [phone] or Betts,Megan Relative [phone]  [phone]     Current Level of Care: Hospital Recommended Level of Care: [place] Prior Approval Number:    Date Approved/Denied: 03/11/18 PASRR Number:   [id number]  Discharge Plan: SNF    Current Diagnoses: Patient Active Problem List   Diagnosis Date Noted  . Altered mental status, unspecified 03/08/2018  . CVA (cerebral vascular accident) ([place]) 03/08/2018  . Gastroenteritis 02/15/2018  . Fatigue due to treatment 09/07/2016  . Palliative care by specialist   . Advance care planning   . Other insomnia   . Protein-calorie malnutrition, severe 05/25/2016  . Aspiration pneumonia ([place]) 05/23/2016  . Cough in adult 05/05/2016  . Multiple myeloma in relapse ([place]) 09/09/2015  . Congestive heart failure ([place]) 01/06/2015  . Cardiomyopathy ([place]) 01/06/2015  . Nonsustained ventricular tachycardia ([place]) 01/06/2015  . Malnutrition of moderate degree 12/24/2014  . Pressure ulcer 12/24/2014  . Cellulitis of second finger of left hand   . Sepsis ([place]) 12/23/2014  . 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 Arteriosclerosis of coronary artery 08/22/2011  . Cardiac conduction disorder 08/22/2011  . Myocardial infarction ([place]) 08/22/2011  . Cardiac arrhythmia 08/22/2011  . Anxiety 08/21/2011  . Acid  reflux 08/21/2011  . Chronic obstructive pulmonary disease ([place]) 07/26/2011  . Nicotine addiction 07/26/2011    Orientation RESPIRATION BLADDER Height & Weight     Self  O2(2L) Continent Weight: 125 lb (56.7 kg) Height:  6' (182.9 cm)  BEHAVIORAL SYMPTOMS/MOOD NEUROLOGICAL BOWEL NUTRITION STATUS      Continent Diet(Cardiac diet)  AMBULATORY STATUS COMMUNICATION OF NEEDS Skin     Verbally Normal                       Personal Care Assistance Level of Assistance  Feeding   Feeding assistance: Limited assistance       Functional Limitations Info  Sight, Hearing, Speech Sight Info: Adequate   Speech Info: Adequate    SPECIAL CARE FACTORS FREQUENCY  PT (By licensed PT), OT (By licensed OT)     PT Frequency: minimum 2x a week OT Frequency: minimum 2x a week            Contractures Contractures Info: Not present    Additional Factors Info  Code Status, Allergies, Psychotropic Code Status Info: Full Code Allergies Info: No Known Allergies  Psychotropic Info: haloperidol (HALDOL) tablet 5 mg          Current Medications (03/11/2018):  This is the current hospital active medication list Current Facility-Administered Medications  Medication Dose Route Frequency Provider Last Rate Last Dose  . acetaminophen (TYLENOL) tablet 650 mg  650 mg Oral Q4H PRN Sudini, Alveta Heimlich, MD       Or  . acetaminophen (TYLENOL) solution 650 mg  650 mg Per Tube Q4H PRN Hillary Bow, MD       Or  . acetaminophen (TYLENOL) suppository  650 mg  650 mg Rectal Q4H PRN Sudini, Alveta Heimlich, MD      . acetaminophen (TYLENOL) tablet 650 mg  650 mg Oral Q6H PRN Merlyn Lot, MD   650 mg at 03/10/18 2030  . albuterol (PROVENTIL) (2.5 MG/3ML) 0.083% nebulizer solution 2.5 mg  2.5 mg Inhalation Q6H PRN Merlyn Lot, MD      . ALPRAZolam Duanne Moron) tablet 0.5 mg  0.5 mg Oral TID PRN Merlyn Lot, MD   0.5 mg at 03/10/18 2334  . [START ON 03/12/2018] aspirin EC tablet 81 mg  81 mg Oral Daily  Ouma, Bing Neighbors, NP      . atorvastatin (LIPITOR) tablet 40 mg  40 mg Oral q1800 Hillary Bow, MD   40 mg at 03/11/18 1826  . clopidogrel (PLAVIX) tablet 75 mg  75 mg Oral Daily Lang Snow, NP   75 mg at 03/11/18 1358  . digoxin (LANOXIN) tablet 0.25 mg  0.25 mg Oral Daily Neoma Laming A, MD   0.25 mg at 03/11/18 0950  . diphenhydrAMINE (BENADRYL) capsule 50 mg  50 mg Oral QHS Lavella Hammock, MD      . DULoxetine (CYMBALTA) DR capsule 30 mg  30 mg Oral Daily Hillary Bow, MD   30 mg at 03/11/18 0950  . enoxaparin (LOVENOX) injection 40 mg  40 mg Subcutaneous Q24H Hillary Bow, MD   40 mg at 03/11/18 1358  . feeding supplement (ENSURE ENLIVE) (ENSURE ENLIVE) liquid 237 mL  237 mL Oral TID BM Sudini, Srikar, MD      . fentaNYL (DURAGESIC) 100 MCG/HR 1 patch  1 patch Transdermal Q48H Merlyn Lot, MD   1 patch at 03/11/18 1833  . fentaNYL (DURAGESIC) 25 MCG/HR 1 patch  1 patch Transdermal Q48H Merlyn Lot, MD   1 patch at 03/11/18 1827  . fluticasone furoate-vilanterol (BREO ELLIPTA) 200-25 MCG/INH 1 puff  1 puff Inhalation Daily Merlyn Lot, MD   1 puff at 03/09/18 1112  . haloperidol (HALDOL) tablet 1 mg  1 mg Oral Q6H PRN Lavella Hammock, MD      . haloperidol (HALDOL) tablet 5 mg  5 mg Oral QHS Lavella Hammock, MD      . haloperidol lactate (HALDOL) injection 5 mg  5 mg Intramuscular Q6H PRN Salary, Holly Bodily D, MD   5 mg at 03/11/18 0206  . ipratropium-albuterol (DUONEB) 0.5-2.5 (3) MG/3ML nebulizer solution 3 mL  3 mL Nebulization Q4H PRN Merlyn Lot, MD      . megestrol (MEGACE) 400 MG/10ML suspension 320 mg  320 mg Oral BID Hillary Bow, MD   320 mg at 03/11/18 1800  . metoprolol tartrate (LOPRESSOR) tablet 50 mg  50 mg Oral BID Arta Silence, MD   50 mg at 03/11/18 0950  . [START ON 03/12/2018] multivitamin with minerals tablet 1 tablet  1 tablet Oral Daily Sudini, Srikar, MD      . nicotine (NICODERM CQ - dosed in mg/24 hours) patch  14 mg  14 mg Transdermal Daily Hillary Bow, MD   14 mg at 03/11/18 1804  . senna-docusate (Senokot-S) tablet 1 tablet  1 tablet Oral QHS PRN Sudini, Alveta Heimlich, MD      . tamsulosin (FLOMAX) capsule 0.4 mg  0.4 mg Oral Daily Hillary Bow, MD   0.4 mg at 03/11/18 0950  . tiotropium (SPIRIVA) inhalation capsule ([facility] use ONLY) 18 mcg  18 mcg Inhalation Daily Hillary Bow, MD   18 mcg at 03/11/18 0950     Discharge Medications:  Please see discharge summary for a list of discharge medications.  Relevant Imaging Results:  Relevant Lab Results:   Additional Information SSN [id number]  [name] Ludwig, [place]

## 2018-03-11 NOTE — Progress Notes (Addendum)
Subjective: Patient awake, alert and oriented x4.  Per nursing report,  patient was extremely agitated, aggressive and confused overnight with episodes of hallucination and paranoia.  Patient required PRN alprazolam, Haldol and Ativan.  Currently with a sitter at the bedside.  Per nursing reports patient is also retaining urine.  Objective: Current vital signs: BP 110/78 (BP Location: Right Arm)   Pulse 95   Temp 98.5 F (36.9 C) (Oral)   Resp 18   Ht 6' (1.829 m)   Wt 56.7 kg   SpO2 96%   BMI 16.95 kg/m  Vital signs in last 24 hours: Temp:  [98.4 F (36.9 C)-98.6 F (37 C)] 98.5 F (36.9 C) (02/17 0949) Pulse Rate:  [95-131] 95 (02/17 0949) Resp:  [18-21] 18 (02/17 0949) BP: (99-120)/(72-93) 110/78 (02/17 0949) SpO2:  [91 %-97 %] 96 % (02/17 0949)  Intake/Output from previous day: 02/16 0701 - 02/17 0700 In: 480 [P.O.:480] Out: 1200 [Urine:1200] Intake/Output this shift: Total I/O In: 0  Out: 750 [Urine:750] Nutritional status:  Diet Order            Diet Heart Room service appropriate? Yes; Fluid consistency: Thin  Diet effective now             Physical Exam   HEENT-  Normocephalic, no lesions, without obvious abnormality.  Normal external eye and conjunctiva.  Normal TM's bilaterally.  Normal auditory canals and external ears. Normal external nose, mucus membranes and septum.  Normal pharynx. Cardiovascular- S1, S2 normal, pulses palpable throughout   Lungs- chest clear, no wheezing, rales, normal symmetric air entry Abdomen- soft, non-tender; bowel sounds normal; no masses,  no organomegaly Extremities- no edema Lymph-no adenopathy palpable Musculoskeletal-no joint tenderness, deformity or swelling Skin-warm and dry, no hyperpigmentation, vitiligo, or suspicious lesions  Neurological Exam  Mental Status: Alert, oriented to person but not to place or time, thought content mildly inappropriate.  Speech fluent without evidence of aphasia. Able to follow  simple commands but with some difficulty. Requires prompting and cueing. Attention span and concentration seemed mildly impaired.  Cranial Nerves: II: Discs flat bilaterally; Visual fields grossly normal, pupils equal, round, reactive to light and accommodation III,IV, VI: ptosis not present, extra-ocular motions intact bilaterally V,VII: smile symmetric, facial light touch sensationintact VIII: hearing normal bilaterally IX,X: gag reflex present XI: bilateral shoulder shrug XII: midline tongue extension Motor: Right :Upper extremity 5/5Without pronator driftLeft: Upper extremity 5/5 without pronator drift Right:Lower extremity 5/5Left: Lower extremity 5/5 Tone and bulk:normal tone throughout; no atrophy noted Sensory: Pinprick and light touchintact bilaterally Deep Tendon Reflexes: 2+ and symmetric throughout Plantars: Right:muteLeft: mute Cerebellar: Finger-to-nosetesting intact bilaterally.Heel to shin testing normal bilaterally Gait: not tested due to safety concerns  Data Reviewed  Lab Results: Basic Metabolic Panel: Recent Labs  Lab 03/07/18 1620 03/10/18 0409  NA 137 139  K 3.8 3.6  CL 100 108  CO2 30 27  GLUCOSE 95 94  BUN 13 6  CREATININE 0.89 0.54*  CALCIUM 9.0 8.0*  MG  --  1.7  PHOS  --  2.8    Liver Function Tests: Recent Labs  Lab 03/07/18 1620 03/10/18 0409  AST 14* 18  ALT 7 7  ALKPHOS 44 41  BILITOT 0.8 0.8  PROT 6.9 5.7*  ALBUMIN 3.6 2.8*   No results for input(s): LIPASE, AMYLASE in the last 168 hours. Recent Labs  Lab 03/08/18 1454  AMMONIA 15    CBC: Recent Labs  Lab 03/07/18 1620 03/10/18 0409  WBC 12.8* 10.9*  NEUTROABS 10.8* 8.2*  HGB 10.8* 9.6*  HCT 34.8* 30.2*  MCV 89.9 88.8  PLT 209 190    Cardiac Enzymes: Recent Labs  Lab 03/10/18 2224  TROPONINI <0.03    Lipid Panel: Recent Labs  Lab 03/09/18 0407  CHOL 84   TRIG 47  HDL 31*  CHOLHDL 2.7  VLDL 9  LDLCALC 44    CBG: No results for input(s): GLUCAP in the last 168 hours.  Microbiology: Results for orders placed or performed during the hospital encounter of 02/15/18  Gastrointestinal Panel by PCR , Stool     Status: Abnormal   Collection Time: 02/15/18 11:52 AM  Result Value Ref Range Status   Campylobacter species NOT DETECTED NOT DETECTED Final   Plesimonas shigelloides NOT DETECTED NOT DETECTED Final   Salmonella species NOT DETECTED NOT DETECTED Final   Yersinia enterocolitica NOT DETECTED NOT DETECTED Final   Vibrio species NOT DETECTED NOT DETECTED Final   Vibrio cholerae NOT DETECTED NOT DETECTED Final   Enteroaggregative E coli (EAEC) NOT DETECTED NOT DETECTED Final   Enteropathogenic E coli (EPEC) NOT DETECTED NOT DETECTED Final   Enterotoxigenic E coli (ETEC) NOT DETECTED NOT DETECTED Final   Shiga like toxin producing E coli (STEC) NOT DETECTED NOT DETECTED Final   Shigella/Enteroinvasive E coli (EIEC) NOT DETECTED NOT DETECTED Final   Cryptosporidium NOT DETECTED NOT DETECTED Final   Cyclospora cayetanensis NOT DETECTED NOT DETECTED Final   Entamoeba histolytica NOT DETECTED NOT DETECTED Final   Giardia lamblia NOT DETECTED NOT DETECTED Final   Adenovirus F40/41 NOT DETECTED NOT DETECTED Final   Astrovirus NOT DETECTED NOT DETECTED Final   Norovirus GI/GII NOT DETECTED NOT DETECTED Final   Rotavirus A NOT DETECTED NOT DETECTED Final   Sapovirus (I, II, IV, and V) DETECTED (A) NOT DETECTED Final    Comment: Performed at [facility], [address] [place], [place] [address]  C difficile quick scan w PCR reflex     Status: None   Collection Time: 02/15/18 11:52 AM  Result Value Ref Range Status   C Diff antigen NEGATIVE NEGATIVE Final   C Diff toxin NEGATIVE NEGATIVE Final   C Diff interpretation No C. difficile detected.  Final    Comment: Performed at [facility], [address]  [place], [place] [address]  Blood culture (routine x 2)     Status: None   Collection Time: 02/15/18  2:52 PM  Result Value Ref Range Status   Specimen Description BLOOD BLOOD RIGHT FOREARM  Final   Special Requests   Final    BOTTLES DRAWN AEROBIC AND ANAEROBIC Blood Culture results may not be optimal due to an excessive volume of blood received in culture bottles   Culture   Final    NO GROWTH 5 DAYS Performed at [facility], [address], [place], [place] [address]    Report Status 02/20/2018 FINAL  Final  Blood culture (routine x 2)     Status: None   Collection Time: 02/15/18  2:54 PM  Result Value Ref Range Status   Specimen Description BLOOD BLOOD RIGHT WRIST  Final   Special Requests   Final    BOTTLES DRAWN AEROBIC AND ANAEROBIC Blood Culture adequate volume   Culture   Final    NO GROWTH 5 DAYS Performed at [facility], [address] [place], [place] [address]    Report Status 02/20/2018 FINAL  Final    Coagulation Studies: No results for input(s):  LABPROT, INR in the last 72 hours.  Imaging: Ct Angio Head W Or Wo Contrast  Result Date: 03/09/2018 CLINICAL DATA:  Altered mental status. Possible right frontal lobe infarct on CT. EXAM: CT ANGIOGRAPHY HEAD AND NECK TECHNIQUE: Multidetector CT imaging of the head and neck was performed using the standard protocol during bolus administration of intravenous contrast. Multiplanar CT image reconstructions and MIPs were obtained to evaluate the vascular anatomy. Carotid stenosis measurements (when applicable) are obtained utilizing NASCET criteria, using the distal internal carotid diameter as the denominator. CONTRAST:  16m OMNIPAQUE IOHEXOL 350 MG/ML SOLN COMPARISON:  Head CT 03/08/2018. Carotid Doppler ultrasound 05/29/2012. FINDINGS: CT HEAD FINDINGS Brain: No definite acute infarct, intracranial hemorrhage, mass, midline shift, or extra-axial fluid collection is identified. The focal region of hypodensity in  the right frontal lobe described on yesterday's CT is not clearly persistent on today's study and may have reflected volume averaging through a sulcus. There is mild cerebral atrophy. Scattered cerebral white matter hypodensities are nonspecific but compatible with mild chronic small vessel ischemic disease. Vascular: Calcified atherosclerosis at the skull base. No hyperdense vessel. Skull: No fracture or focal osseous lesion. Sinuses: Right maxillary sinus mucous retention cyst. Clear mastoid air cells. Orbits: Unremarkable. Review of the MIP images confirms the above findings CTA NECK FINDINGS Aortic arch: Standard 3 vessel aortic arch with widely patent arch vessel origins. Right carotid system: Patent with mild calcified plaque at the carotid bifurcation. No evidence of significant stenosis or dissection. Left carotid system: Patent with moderate calcified plaque at the carotid bifurcation. No evidence of significant stenosis or dissection. Tortuous distal cervical ICA. Vertebral arteries: Patent without evidence of significant stenosis or dissection. Moderately dominant left vertebral artery. Skeleton: Poor dentition with multiple dental caries and missing teeth. Other neck: No evidence of acute abnormality or mass. Upper chest: Emphysema. Review of the MIP images confirms the above findings CTA HEAD FINDINGS Anterior circulation: The internal carotid arteries are patent from skull base to carotid termini with nonstenotic plaque bilaterally. ACAs and MCAs are patent without evidence of proximal branch occlusion or significant proximal stenosis. No aneurysm is identified. Posterior circulation: The intracranial vertebral arteries are patent to the basilar. Patent PICA and SCA origins are identified bilaterally. The basilar artery is patent with a mild stenosis in its midportion. There are moderately large posterior communicating arteries bilaterally with hypoplasia of the P1 segments. No flow limiting proximal  PCA stenosis is evident. No aneurysm is identified. Venous sinuses: Patent. Anatomic variants: Predominantly fetal origin of the PCAs. Delayed phase: No abnormal enhancement. Review of the MIP images confirms the above findings IMPRESSION: 1. Intracranial atherosclerosis without major branch occlusion or flow limiting proximal stenosis. 2. Widely patent cervical carotid and vertebral arteries. 3. No evidence of acute infarct or hemorrhage on noncontrast head CT. 4.  Emphysema (ICD10-J43.9). Electronically Signed   By: ALogan BoresM.D.   On: 03/09/2018 14:06   Ct Angio Neck W Or Wo Contrast  Result Date: 03/09/2018 CLINICAL DATA:  Altered mental status. Possible right frontal lobe infarct on CT. EXAM: CT ANGIOGRAPHY HEAD AND NECK TECHNIQUE: Multidetector CT imaging of the head and neck was performed using the standard protocol during bolus administration of intravenous contrast. Multiplanar CT image reconstructions and MIPs were obtained to evaluate the vascular anatomy. Carotid stenosis measurements (when applicable) are obtained utilizing NASCET criteria, using the distal internal carotid diameter as the denominator. CONTRAST:  768mOMNIPAQUE IOHEXOL 350 MG/ML SOLN COMPARISON:  Head CT 03/08/2018. Carotid Doppler ultrasound 05/29/2012.  FINDINGS: CT HEAD FINDINGS Brain: No definite acute infarct, intracranial hemorrhage, mass, midline shift, or extra-axial fluid collection is identified. The focal region of hypodensity in the right frontal lobe described on yesterday's CT is not clearly persistent on today's study and may have reflected volume averaging through a sulcus. There is mild cerebral atrophy. Scattered cerebral white matter hypodensities are nonspecific but compatible with mild chronic small vessel ischemic disease. Vascular: Calcified atherosclerosis at the skull base. No hyperdense vessel. Skull: No fracture or focal osseous lesion. Sinuses: Right maxillary sinus mucous retention cyst. Clear mastoid  air cells. Orbits: Unremarkable. Review of the MIP images confirms the above findings CTA NECK FINDINGS Aortic arch: Standard 3 vessel aortic arch with widely patent arch vessel origins. Right carotid system: Patent with mild calcified plaque at the carotid bifurcation. No evidence of significant stenosis or dissection. Left carotid system: Patent with moderate calcified plaque at the carotid bifurcation. No evidence of significant stenosis or dissection. Tortuous distal cervical ICA. Vertebral arteries: Patent without evidence of significant stenosis or dissection. Moderately dominant left vertebral artery. Skeleton: Poor dentition with multiple dental caries and missing teeth. Other neck: No evidence of acute abnormality or mass. Upper chest: Emphysema. Review of the MIP images confirms the above findings CTA HEAD FINDINGS Anterior circulation: The internal carotid arteries are patent from skull base to carotid termini with nonstenotic plaque bilaterally. ACAs and MCAs are patent without evidence of proximal branch occlusion or significant proximal stenosis. No aneurysm is identified. Posterior circulation: The intracranial vertebral arteries are patent to the basilar. Patent PICA and SCA origins are identified bilaterally. The basilar artery is patent with a mild stenosis in its midportion. There are moderately large posterior communicating arteries bilaterally with hypoplasia of the P1 segments. No flow limiting proximal PCA stenosis is evident. No aneurysm is identified. Venous sinuses: Patent. Anatomic variants: Predominantly fetal origin of the PCAs. Delayed phase: No abnormal enhancement. Review of the MIP images confirms the above findings IMPRESSION: 1. Intracranial atherosclerosis without major branch occlusion or flow limiting proximal stenosis. 2. Widely patent cervical carotid and vertebral arteries. 3. No evidence of acute infarct or hemorrhage on noncontrast head CT. 4.  Emphysema (ICD10-J43.9).  Electronically Signed   By: Logan Bores M.D.   On: 03/09/2018 14:06   Medications:  I have reviewed the patient's current medications. Prior to Admission:  Medications Prior to Admission  Medication Sig Dispense Refill Last Dose  . acetaminophen (TYLENOL) 325 MG tablet Take 2 tablets (650 mg total) by mouth every 6 (six) hours as needed for mild pain (or Fever >/= 101).   prn at prn  . albuterol (PROVENTIL HFA;VENTOLIN HFA) 108 (90 Base) MCG/ACT inhaler Inhale 2 puffs into the lungs every 6 (six) hours as needed for wheezing or shortness of breath. 1 Inhaler 6 prn at prn  . ALPRAZolam (XANAX) 0.5 MG tablet Take 1 tablet (0.5 mg total) by mouth 3 (three) times daily as needed for anxiety. 90 tablet 1 03/07/2018 at 1400  . aspirin 325 MG EC tablet Take 325 mg by mouth daily.   03/07/2018 at 0930  . atorvastatin (LIPITOR) 20 MG tablet Take 20 mg by mouth daily.  2 03/07/2018 at 0930  . dexamethasone (DECADRON) 4 MG tablet TAKE 3 TABLETS (12 MG TOTAL) BY MOUTH ONCE A WEEK. TAKE WITH BREAKFAST 12 tablet 4 Past Week at Unknown time  . DULoxetine (CYMBALTA) 30 MG capsule TAKE ONE CAPSULE BY MOUTH EVERY DAY 90 capsule 1 03/07/2018 at 0930  . fentaNYL (  DURAGESIC) 100 MCG/HR Place 1 patch onto the skin every other day. Use with a 25 mcg patch for total dose of 125 mcg 15 patch 0 03/06/2018 at 0930  . fentaNYL (DURAGESIC) 25 MCG/HR Place 1 patch onto the skin every other day. Use with a 100 mcg patch for total dose of 125 mcg 15 patch 0 03/06/2018 at 0930  . Fluticasone-Salmeterol (ADVAIR DISKUS) 250-50 MCG/DOSE AEPB Inhale 1 puff into the lungs 2 (two) times daily. 3 each 2 03/07/2018 at 0930  . gabapentin (NEURONTIN) 100 MG capsule Take 2 capsules (200 mg total) by mouth 3 (three) times daily. 180 capsule 3 03/07/2018 at 1400  . HYDROmorphone (DILAUDID) 2 MG tablet Take 1 tablet (2 mg total) by mouth every 6 (six) hours as needed for severe pain. 56 tablet 0 03/07/2018 at 1400  . ipratropium-albuterol (DUONEB)  0.5-2.5 (3) MG/3ML SOLN Take 3 mLs by nebulization every 4 (four) hours as needed. 360 mL 3 prn at prn  . megestrol (MEGACE) 20 MG tablet TAKE ONE TABLET BY MOUTH ONCE DAILY 30 tablet 2 03/07/2018 at 0930  . metoprolol tartrate (LOPRESSOR) 50 MG tablet Take 50 mg by mouth 2 (two) times daily.  2 03/07/2018 at 0930  . naloxone [facility]) nasal spray 4 mg/0.1 mL For Opioid Overdose: Call 911 and administer a single spray of Narcan in one nostril. Repeat every 88mns as needed if no or minimal response 1 kit 2   . NINLARO 4 MG capsule TAKE 1 CAPSULE (4 MG TOTAL) BY MOUTH ONCE A WEEK. TAKE FOR 3 WEEKS ON, THEN 1 WEEK OFF. TAKE ON EMPTY STOMACH 1HR BEFORE OR 2HRS AFTER FOOD. 3 capsule 4 Past Month at Unknown time  . ondansetron (ZOFRAN) 8 MG tablet 1 pill every 8 hours as needed. 40 tablet 0 prn at prn  . tamsulosin (FLOMAX) 0.4 MG CAPS capsule Take 1 capsule (0.4 mg total) by mouth daily. 30 capsule 0 03/07/2018 at 0930  . tiotropium (SPIRIVA HANDIHALER) 18 MCG inhalation capsule Place 1 capsule (18 mcg total) into inhaler and inhale daily. 30 capsule 6 03/07/2018 at 0930   Scheduled: . aspirin  325 mg Oral Daily  . atorvastatin  40 mg Oral q1800  . digoxin  0.25 mg Oral Daily  . DULoxetine  30 mg Oral Daily  . enoxaparin (LOVENOX) injection  40 mg Subcutaneous Q24H  . fentaNYL  1 patch Transdermal Q48H  . fentaNYL  1 patch Transdermal Q48H  . fluticasone furoate-vilanterol  1 puff Inhalation Daily  . megestrol  20 mg Oral Daily  . metoprolol tartrate  50 mg Oral BID  . nicotine  14 mg Transdermal Daily  . tamsulosin  0.4 mg Oral Daily  . tiotropium  18 mcg Inhalation Daily   Patient seen and examined.  Clinical course and management discussed.  Necessary edits performed.  I agree with the above.  Assessment and plan of care developed and discussed below.    Assessment: 58y.o. male ith past medical history of multiple myeloma, myocardial infarction, COPD on chronic oxygen, CHF, polysubstance abuse,  hyperlipidemia, CAD, defibrillator placement, and hyperthyroidism presenting to the ED on 03/07/2018 under IVC for altered mental status associated with behavioral disturbances. A non-contrast head CT showed decreased attenuation in the posterior mid right frontal lobe questionable infarct in this area which is likely contributing to change in mental status. However cannot rule out additional contributing medical issues given hx of multiple myeloma.  Unable to obtain MRI brain due to presence of  defibrillator.  Echocardiogram did not show cardiac source of emboli.  Further work up revealed unremarkable RPR, thyroid-stimulating hormone (TSH). Noted with low  B12.  LDL 44.  A1c 5.1.  Patient was on Aspirin 81 mg prior to this event.    Plan: 1. Continue dual therapy Aspirin 81 mg/day and Plavix 75 mg /day x 30 days then stop Aspirin and continue with Plavix 75 mg/day.  Continue statin.   2. B12 replacement 3. Labs:Lyme reflex, thiamine pending 4. PT consult, OT consult, Speech consult 5. Avoid benzodiazepine due to paradoxical worsening of behavioral disturbances 6. Telemetry monitoring 7. Frequent neuro checks  This patient was staffed with Dr. Magda Paganini, Doy Mince who personally evaluated patient, reviewed documentation and agreed with assessment and plan of care as above.  Rufina Falco, DNP, FNP-BC Board certified Nurse Practitioner Neurology Department    LOS: 3 days   03/11/2018  12:20 PM  Alexis Goodell, MD Neurology [phone]  03/11/2018  1:30 PM

## 2018-03-11 NOTE — Progress Notes (Addendum)
[facility] [place] NAME: [name]    MR#:  [id number]  DATE OF BIRTH:  [birth year]  SUBJECTIVE:  CHIEF COMPLAINT:   Chief Complaint  Patient presents with  . Hallucinations   Drowzy. Agitated overnight.  REVIEW OF SYSTEMS:    Review of Systems  Constitutional: Positive for malaise/fatigue. Negative for chills and fever.  HENT: Negative for sore throat.   Eyes: Negative for blurred vision, double vision and pain.  Respiratory: Negative for cough, hemoptysis, shortness of breath and wheezing.   Cardiovascular: Negative for chest pain, palpitations, orthopnea and leg swelling.  Gastrointestinal: Negative for abdominal pain, constipation, diarrhea, heartburn, nausea and vomiting.  Genitourinary: Negative for dysuria and hematuria.  Musculoskeletal: Negative for back pain and joint pain.  Skin: Positive for itching. Negative for rash.  Neurological: Negative for sensory change, speech change, focal weakness and headaches.  Endo/Heme/Allergies: Does not bruise/bleed easily.  Psychiatric/Behavioral: Negative for depression. The patient is not nervous/anxious.    DRUG ALLERGIES:  No Known Allergies  VITALS:  Blood pressure 110/78, pulse 95, temperature 98.5 F (36.9 C), temperature source Oral, resp. rate 18, height 6' (1.829 m), weight 56.7 kg, SpO2 96 %.  PHYSICAL EXAMINATION:   Physical Exam  GENERAL:  58 y.o.-year-old patient lying in the bed with no acute distress.  EYES: Pupils equal, round, reactive to light and accommodation. No scleral icterus. Extraocular muscles intact.  HEENT: Head atraumatic, normocephalic. Oropharynx and nasopharynx clear.  NECK:  Supple, no jugular venous distention. No thyroid enlargement, no tenderness.  LUNGS: Normal breath sounds bilaterally, no wheezing, rales, rhonchi. No use of accessory muscles of respiration. CARDIOVASCULAR: S1, S2 .  Tachycardia. ABDOMEN: Soft, nontender, nondistended.  Bowel sounds present. No organomegaly or mass. EXTREMITIES: No cyanosis, clubbing or edema b/l. NEUROLOGIC: Not following commands PSYCHIATRIC: The patient is drowzy SKIN: No obvious rash, lesion, or ulcer.   LABORATORY PANEL:   CBC Recent Labs  Lab 03/10/18 0409  WBC 10.9*  HGB 9.6*  HCT 30.2*  PLT 190   ------------------------------------------------------------------------------------------------------------------ Chemistries  Recent Labs  Lab 03/10/18 0409  NA 139  K 3.6  CL 108  CO2 27  GLUCOSE 94  BUN 6  CREATININE 0.54*  CALCIUM 8.0*  MG 1.7  AST 18  ALT 7  ALKPHOS 41  BILITOT 0.8   ------------------------------------------------------------------------------------------------------------------  Cardiac Enzymes Recent Labs  Lab 03/10/18 2224  TROPONINI <0.03   ------------------------------------------------------------------------------------------------------------------  RADIOLOGY:  Ct Angio Head W Or Wo Contrast  Result Date: 03/09/2018 CLINICAL DATA:  Altered mental status. Possible right frontal lobe infarct on CT. EXAM: CT ANGIOGRAPHY HEAD AND NECK TECHNIQUE: Multidetector CT imaging of the head and neck was performed using the standard protocol during bolus administration of intravenous contrast. Multiplanar CT image reconstructions and MIPs were obtained to evaluate the vascular anatomy. Carotid stenosis measurements (when applicable) are obtained utilizing NASCET criteria, using the distal internal carotid diameter as the denominator. CONTRAST:  30m OMNIPAQUE IOHEXOL 350 MG/ML SOLN COMPARISON:  Head CT 03/08/2018. Carotid Doppler ultrasound 05/29/2012. FINDINGS: CT HEAD FINDINGS Brain: No definite acute infarct, intracranial hemorrhage, mass, midline shift, or extra-axial fluid collection is identified. The focal region of hypodensity in the right frontal lobe described on yesterday's CT is not clearly persistent on today's study and may have  reflected volume averaging through a sulcus. There is mild cerebral atrophy. Scattered cerebral white matter hypodensities are nonspecific but compatible with mild chronic small vessel ischemic disease. Vascular: Calcified atherosclerosis at the skull  base. No hyperdense vessel. Skull: No fracture or focal osseous lesion. Sinuses: Right maxillary sinus mucous retention cyst. Clear mastoid air cells. Orbits: Unremarkable. Review of the MIP images confirms the above findings CTA NECK FINDINGS Aortic arch: Standard 3 vessel aortic arch with widely patent arch vessel origins. Right carotid system: Patent with mild calcified plaque at the carotid bifurcation. No evidence of significant stenosis or dissection. Left carotid system: Patent with moderate calcified plaque at the carotid bifurcation. No evidence of significant stenosis or dissection. Tortuous distal cervical ICA. Vertebral arteries: Patent without evidence of significant stenosis or dissection. Moderately dominant left vertebral artery. Skeleton: Poor dentition with multiple dental caries and missing teeth. Other neck: No evidence of acute abnormality or mass. Upper chest: Emphysema. Review of the MIP images confirms the above findings CTA HEAD FINDINGS Anterior circulation: The internal carotid arteries are patent from skull base to carotid termini with nonstenotic plaque bilaterally. ACAs and MCAs are patent without evidence of proximal branch occlusion or significant proximal stenosis. No aneurysm is identified. Posterior circulation: The intracranial vertebral arteries are patent to the basilar. Patent PICA and SCA origins are identified bilaterally. The basilar artery is patent with a mild stenosis in its midportion. There are moderately large posterior communicating arteries bilaterally with hypoplasia of the P1 segments. No flow limiting proximal PCA stenosis is evident. No aneurysm is identified. Venous sinuses: Patent. Anatomic variants: Predominantly  fetal origin of the PCAs. Delayed phase: No abnormal enhancement. Review of the MIP images confirms the above findings IMPRESSION: 1. Intracranial atherosclerosis without major branch occlusion or flow limiting proximal stenosis. 2. Widely patent cervical carotid and vertebral arteries. 3. No evidence of acute infarct or hemorrhage on noncontrast head CT. 4.  Emphysema (ICD10-J43.9). Electronically Signed   By: Logan Bores M.D.   On: 03/09/2018 14:06   Ct Angio Neck W Or Wo Contrast  Result Date: 03/09/2018 CLINICAL DATA:  Altered mental status. Possible right frontal lobe infarct on CT. EXAM: CT ANGIOGRAPHY HEAD AND NECK TECHNIQUE: Multidetector CT imaging of the head and neck was performed using the standard protocol during bolus administration of intravenous contrast. Multiplanar CT image reconstructions and MIPs were obtained to evaluate the vascular anatomy. Carotid stenosis measurements (when applicable) are obtained utilizing NASCET criteria, using the distal internal carotid diameter as the denominator. CONTRAST:  55m OMNIPAQUE IOHEXOL 350 MG/ML SOLN COMPARISON:  Head CT 03/08/2018. Carotid Doppler ultrasound 05/29/2012. FINDINGS: CT HEAD FINDINGS Brain: No definite acute infarct, intracranial hemorrhage, mass, midline shift, or extra-axial fluid collection is identified. The focal region of hypodensity in the right frontal lobe described on yesterday's CT is not clearly persistent on today's study and may have reflected volume averaging through a sulcus. There is mild cerebral atrophy. Scattered cerebral white matter hypodensities are nonspecific but compatible with mild chronic small vessel ischemic disease. Vascular: Calcified atherosclerosis at the skull base. No hyperdense vessel. Skull: No fracture or focal osseous lesion. Sinuses: Right maxillary sinus mucous retention cyst. Clear mastoid air cells. Orbits: Unremarkable. Review of the MIP images confirms the above findings CTA NECK FINDINGS  Aortic arch: Standard 3 vessel aortic arch with widely patent arch vessel origins. Right carotid system: Patent with mild calcified plaque at the carotid bifurcation. No evidence of significant stenosis or dissection. Left carotid system: Patent with moderate calcified plaque at the carotid bifurcation. No evidence of significant stenosis or dissection. Tortuous distal cervical ICA. Vertebral arteries: Patent without evidence of significant stenosis or dissection. Moderately dominant left vertebral artery. Skeleton: Poor dentition  with multiple dental caries and missing teeth. Other neck: No evidence of acute abnormality or mass. Upper chest: Emphysema. Review of the MIP images confirms the above findings CTA HEAD FINDINGS Anterior circulation: The internal carotid arteries are patent from skull base to carotid termini with nonstenotic plaque bilaterally. ACAs and MCAs are patent without evidence of proximal branch occlusion or significant proximal stenosis. No aneurysm is identified. Posterior circulation: The intracranial vertebral arteries are patent to the basilar. Patent PICA and SCA origins are identified bilaterally. The basilar artery is patent with a mild stenosis in its midportion. There are moderately large posterior communicating arteries bilaterally with hypoplasia of the P1 segments. No flow limiting proximal PCA stenosis is evident. No aneurysm is identified. Venous sinuses: Patent. Anatomic variants: Predominantly fetal origin of the PCAs. Delayed phase: No abnormal enhancement. Review of the MIP images confirms the above findings IMPRESSION: 1. Intracranial atherosclerosis without major branch occlusion or flow limiting proximal stenosis. 2. Widely patent cervical carotid and vertebral arteries. 3. No evidence of acute infarct or hemorrhage on noncontrast head CT. 4.  Emphysema (ICD10-J43.9). Electronically Signed   By: Logan Bores M.D.   On: 03/09/2018 14:06     ASSESSMENT AND PLAN:   *SVT.    On metoprolol.  Digoxin scheduled daily added by cardiology.  *Acute CVA of right frontal lobe.  CTA does not show any acute findings. Unable to get MRI due to defibrillator Echocardiogram pending Aspirin and statin. - Lovenox for DVT prophylaxis. - PT/OT/Speech consult as needed per symptoms Discussed with neurology team.  Not CVA  *Acute encephalopathy.  Agitated overnight. Likely psychosis/sundowning. Discussed with Dr. Leverne Humbles to follow up on  the patient.  Dr. Weber Cooks saw the patient on Friday.  *Hypertension.  ON metoprolol  *COPD with chronic respiratory failure.  On 2-3 L oxygen.  No wheezing. Continue home inhalers and nebulizers as needed.  *Multiple myeloma.  Follows outpatient with cancer center.  We will consult palliative care due to declining functional status.  Patient unable to make decisions for himself.  Discussed with Daughter over phone.  DVT prophylaxis Lovenox  All the records are reviewed and case discussed with Care Management/Social Worker Management plans discussed with the patient, family and they are in agreement.  CODE STATUS: FULL CODE  DVT Prophylaxis: SCDs  TOTAL CRITICAL CARE TIME TAKING CARE OF THIS PATIENT: 35 minutes.   POSSIBLE D/C IN 1-2 DAYS, DEPENDING ON CLINICAL CONDITION.  Leia Alf Enid Maultsby M.D on 03/11/2018 at 12:27 PM  Between 7am to 6pm - Pager - [phone]  After 6pm go to [email or web address] - password [email or web address] [facility]  Office  [phone]  CC: Primary care physician; Petra Kuba, MD  Note: This dictation was prepared with Dragon dictation along with smaller phrase technology. Any transcriptional errors that result from this process are unintentional.

## 2018-03-11 NOTE — Progress Notes (Signed)
Initial Nutrition Assessment  DOCUMENTATION CODES:   Severe malnutrition in context of chronic illness, Underweight  INTERVENTION:  Recommend liberalizing diet to regular.  Provide Ensure Enlive po TID, each supplement provides 350 kcal and 20 grams of protein. Patient prefers strawberry.  Provide daily MVI.  Current Megace dose not high enough to stimulate appetite. Consider increasing to 300 mg BID.  NUTRITION DIAGNOSIS:   Severe Malnutrition related to chronic illness(COPD, CHF, multiple myeloma) as evidenced by severe fat depletion, severe muscle depletion.  GOAL:   Patient will meet greater than or equal to 90% of their needs  MONITOR:   PO intake, Supplement acceptance, Labs, Weight trends, Skin, I & O's  REASON FOR ASSESSMENT:   Other (Comment)(Low BMI)    ASSESSMENT:   58 year old male with PMHx of anxiety, multiple myeloma on Ninlaro-Dex, CHF, COPD, GERD, substance abuse, hx MI, CAD, HLD, arthritis, hx cardiac defibrillator placement admitted with SVT, acute encephalopathy.   Patient was very lethargic this AM and unable to provide any history. He is known to this RD from a previous admission. Meal completion has been 0% so far this admission. On previous admission a few weeks ago patient reported a decreased appetite and intake for the past 5 years. He was eating 2 small meals per day and drinking one bottle of Boost original per day at that time.  UBW had been 180-185 lbs and patient slowly lost weight over time to around 130 lbs. Patient currently 56.7 kg (125 lbs).  Medications reviewed and include: fentanyl patch, Megace 20 mg daily, Seroquel 25 mg QHS, Flomax.  Labs reviewed: Creatinine 0.54, Anion gap 4.  NUTRITION - FOCUSED PHYSICAL EXAM:    Most Recent Value  Orbital Region  Severe depletion  Upper Arm Region  Severe depletion  Thoracic and Lumbar Region  Severe depletion  Buccal Region  Severe depletion  Temple Region  Severe depletion  Clavicle  Bone Region  Severe depletion  Clavicle and Acromion Bone Region  Severe depletion  Scapular Bone Region  Severe depletion  Dorsal Hand  Severe depletion  Patellar Region  Severe depletion  Anterior Thigh Region  Severe depletion  Posterior Calf Region  Severe depletion  Edema (RD Assessment)  None  Hair  Reviewed  Eyes  Reviewed  Mouth  Reviewed  Skin  Reviewed  Nails  Reviewed     Diet Order:   Diet Order            Diet Heart Room service appropriate? Yes; Fluid consistency: Thin  Diet effective now             EDUCATION NEEDS:   Not appropriate for education at this time  Skin:  Skin Assessment: Skin Integrity Issues:(MSAD to groin and perineum; ecchymosis)  Last BM:  03/10/2018 - large type 3  Height:   Ht Readings from Last 1 Encounters:  03/07/18 6' (1.829 m)   Weight:   Wt Readings from Last 1 Encounters:  03/08/18 56.7 kg   Ideal Body Weight:  80.9 kg  BMI:  Body mass index is 16.95 kg/m.  Estimated Nutritional Needs:   Kcal:  9163-8466  Protein:  90-100 grams  Fluid:  1.9-2.2 L/day  Willey Blade, MS, RD, LDN Office: [phone] Pager: [phone] After Hours/Weekend Pager: [phone]

## 2018-03-11 NOTE — Telephone Encounter (Signed)
Patient in hospital    ASSESSMENT AND PLAN:   *SVT.  Presently with sinus tachycardia.  Received metoprolol.  Borderline blood pressure. IV digoxin stat. Neurology on board.  *Acute CVA of right frontal lobe.  CTA does not show any acute findings. Unable to get MRI due to defibrillator Echocardiogram pending aspirin and statin. - Lovenox for DVT prophylaxis. - PT/OT/Speech consult as needed per symptoms Discussed with neurology team.    *Acute encephalopathy.  This is improved.  Likely medication related.  *Hypertension. Hold medications for permissive hypertension  *COPD with chronic respiratory failure. On 2-3 L oxygen. No wheezing. Continue home inhalers and nebulizers as needed  *Multiple myeloma. Follows outpatient with cancer center.  Discussed with patient's fianc over the phone.  At baseline he carries on normal conversations and able to ambulate.  DVT prophylaxis Lovenox

## 2018-03-11 NOTE — Progress Notes (Signed)
Bladder scanned pt yielded >870 cc, Dr. Darvin Neighbours notified received verbal orders to insert foley catheter. Will continue to monitor.
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
## 2018-03-11 NOTE — Progress Notes (Signed)
OT Cancellation Note  Patient Details Name: [name] MRN: [id number] DOB: 18-Sep-1960   Cancelled Treatment:    Reason Eval/Treat Not Completed: Other (comment) Order received, chart reviewed. Upon arrival to room pt asleep in bed with sitter at bedside. Pt deeply asleep. Per sitter, pt not responding to verbal or tactile stimuli. Will re-attempt at a later time when pt available and appropriate for [place].   Shara Blazing, M.S., OTR/L Ascom: [phone] 03/11/18, 9:30 AM
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
## 2018-03-11 NOTE — Progress Notes (Signed)
SUBJECTIVE: Patient was very agitated over night and required multiple doses of antipsychotics and anxiolytics, now sleeping deeply. Not responsive to verbal stimuli.    Vitals:   03/10/18 2200 03/10/18 2305 03/11/18 0147 03/11/18 0435  BP:  120/82 107/73 99/72  Pulse: (!) 110 (!) 105 (!) 105 95  Resp:  (!) 21 (!) 21 20  Temp:  98.4 F (36.9 C)  98.5 F (36.9 C)  TempSrc:  Oral  Oral  SpO2: 94% 97% 94% 94%  Weight:      Height:        Intake/Output Summary (Last 24 hours) at 03/11/2018 0831 Last data filed at 03/10/2018 2130 Gross per 24 hour  Intake 480 ml  Output 1200 ml  Net -720 ml    LABS: Basic Metabolic Panel: Recent Labs    03/10/18 0409  NA 139  K 3.6  CL 108  CO2 27  GLUCOSE 94  BUN 6  CREATININE 0.54*  CALCIUM 8.0*  MG 1.7  PHOS 2.8   Liver Function Tests: Recent Labs    03/10/18 0409  AST 18  ALT 7  ALKPHOS 41  BILITOT 0.8  PROT 5.7*  ALBUMIN 2.8*   No results for input(s): LIPASE, AMYLASE in the last 72 hours. CBC: Recent Labs    03/10/18 0409  WBC 10.9*  NEUTROABS 8.2*  HGB 9.6*  HCT 30.2*  MCV 88.8  PLT 190   Cardiac Enzymes: Recent Labs    03/10/18 2224  TROPONINI <0.03   BNP: Invalid input(s): POCBNP D-Dimer: No results for input(s): DDIMER in the last 72 hours. Hemoglobin A1C: No results for input(s): HGBA1C in the last 72 hours. Fasting Lipid Panel: Recent Labs    03/09/18 0407  CHOL 84  HDL 31*  LDLCALC 44  TRIG 47  CHOLHDL 2.7   Thyroid Function Tests: Recent Labs    03/08/18 1454  TSH 1.143   Anemia Panel: Recent Labs    03/08/18 1454  VITAMINB12 246     PHYSICAL EXAM General: Pale, Cachetic, sleeping, no apparent distress HEENT:  Normocephalic and atramatic Neck:  No JVD.  Lungs: Clear bilaterally to auscultation and percussion. Heart: HRRR . Normal S1 and S2 without gallops or murmurs.  Abdomen: Bowel sounds are positive, abdomen soft and non-tender  Msk:  Back normal, normal gait. Normal  strength and tone for age. Extremities: No clubbing, cyanosis or edema.   Neuro: Sleeping, sedated Psych:  Unresponsive, sleeping  TELEMETRY: NSR 89bpm  ASSESSMENT AND PLAN:  Intermittent tachycardia/SVT: Heart rate increases with agitation but is WNL when sedated. EKG done last night 2200 showed sinus tachycardia 108bpm, no ischemic changes, troponin negative. Blood pressure is also borderline low with sedation. Continue 0.25mg  digoxin, 50mg  Metoprolol BID and will continue to monitor.  Echo done this morning, pending reading.   Principal Problem:   Altered mental status, unspecified Active Problems:   CVA (cerebral vascular accident) ([place])    Jake Bathe, NP-C 03/11/2018 8:31 AM Cell: [phone]

## 2018-03-11 NOTE — Progress Notes (Addendum)
Pnt extremely agitated and confused overnight. Pnt hallucinating, paranoid, verbally aggressive, and thought that someone was coming to "kill him". Pnt educated and reoriented however no change in orientation. Gave PRN Xanax, Haldol, and also given a 1x order for Ativan that finally made pnt less agitated. Sitter at bedside but pnt was requiring 2-3 people in the room to keep pnt in room and not calling out.   Notified Dr of acute urinary retention. Required 1x I&O catheter following a bladder scan of approximately  665ml's and pnts inability to pee. Pnt stood at bedside and was not able to urinate. Urine yellow and clear. Heart rate during moments of urinary retention sustained 130-145's however following I&O and scheduled metoprolol, heart rate gradually came down to WNL.

## 2018-03-12 DIAGNOSIS — R410 Disorientation, unspecified: Secondary | ICD-10-CM

## 2018-03-12 DIAGNOSIS — Z515 Encounter for palliative care: Secondary | ICD-10-CM

## 2018-03-12 MED ORDER — METOPROLOL SUCCINATE ER 25 MG PO TB24
25.0000 mg | ORAL_TABLET | Freq: Every day | ORAL | Status: DC
  Administered 2018-03-12: 25 mg via ORAL
  Filled 2018-03-12: qty 1

## 2018-03-12 MED ORDER — CLOPIDOGREL BISULFATE 75 MG PO TABS: 75.0000 mg | ORAL_TABLET | Freq: Every day | ORAL | 0 refills | Status: DC

## 2018-03-12 MED ORDER — DIGOXIN 250 MCG PO TABS: 0.2500 mg | ORAL_TABLET | Freq: Every day | ORAL | 0 refills | Status: DC

## 2018-03-12 MED ORDER — ASPIRIN 81 MG PO TBEC: 81.0000 mg | DELAYED_RELEASE_TABLET | Freq: Every day | ORAL | Status: AC

## 2018-03-12 NOTE — Progress Notes (Signed)
Physical Therapy Treatment Patient Details Name: [name] MRN: [id number] DOB: Jun 21, 1960 Today's Date: 03/12/2018    History of Present Illness 58 y.o. male with a known history of CAD, multiple myeloma chronic back pain, COPD, chronic respiratory failure on 2 L oxygen, diastolic CHF, atrial septal defect, tobacco use presents to the hospital brought in by his girlfriend due to hallucination and agitation at home.  IVC was placed.  Initially thought to be psychosis.  CT scan of the head shows right frontal CVA.      PT Comments    Pt presents with minor deficits in strength, transfers, gait, balance, and activity tolerance but overall performed well during the session.  Pt was Ind with bed mobility tasks and CGA with transfers requiring extra effort to stand but no physical assist.  Pt demonstrated fair stability in standing during static and dynamic balance training without UE support.  Pt was able to amb 150' with a RW with slow cadence but was steady without LOB.  Pt's HR increased from 110 bpm to 145 bpm after amb but reported no adverse symptoms, nsg notified.  Stair training deferred secondary to elevated HR with amb.  Pt will benefit from HHPT services upon discharge to safely address above deficits for decreased caregiver assistance and eventual return to PLOF.     Follow Up Recommendations  Home health PT;Supervision/Assistance - 24 hour     Equipment Recommendations  None recommended by PT    Recommendations for Other Services       Precautions / Restrictions Precautions Precautions: Fall Restrictions Weight Bearing Restrictions: No    Mobility  Bed Mobility Overal bed mobility: Independent             General bed mobility comments: Good effort in and out of bed without the use of the rail  Transfers Overall transfer level: Needs assistance Equipment used: Rolling walker (2 wheeled) Transfers: Sit to/from Stand Sit to Stand: Min guard;From elevated  surface         General transfer comment: Extra effort required during sit to/from stand from an elevated EOB but no physical assistance required  Ambulation/Gait Ambulation/Gait assistance: Min guard Gait Distance (Feet): 150 Feet Assistive device: Rolling walker (2 wheeled) Gait Pattern/deviations: Step-through pattern;Trunk flexed;Decreased step length - right;Decreased step length - left Gait velocity: reduced   General Gait Details: Slow cadence with short B step length but steady without LOB; min verbal cues for amb closer to the RW with upright posture; HR 145 bpm after amb up from 110 at rest, nursing notified   Stairs             Wheelchair Mobility    Modified Rankin (Stroke Patients Only)       Balance Overall balance assessment: Needs assistance Sitting-balance support: Feet supported;Bilateral upper extremity supported Sitting balance-Leahy Scale: Good     Standing balance support: No upper extremity supported Standing balance-Leahy Scale: Fair                              Cognition Arousal/Alertness: Awake/alert Behavior During Therapy: WFL for tasks assessed/performed Overall Cognitive Status: Within Functional Limits for tasks assessed                                        Exercises Other Exercises Other Exercises: Multiple sit to/from stand transfers Other  Exercises: Static and dynamic standing balance training including reaching outside BOS    General Comments        Pertinent Vitals/Pain Pain Assessment: No/denies pain    Home Living                      Prior Function            PT Goals (current goals can now be found in the care plan section) Progress towards PT goals: Progressing toward goals    Frequency    Min 2X/week(Per MD note, no CVA)      PT Plan Frequency needs to be updated    Co-evaluation              AM-PAC PT "6 Clicks" Mobility   Outcome Measure  Help  needed turning from your back to your side while in a flat bed without using bedrails?: None Help needed moving from lying on your back to sitting on the side of a flat bed without using bedrails?: None Help needed moving to and from a bed to a chair (including a wheelchair)?: None Help needed standing up from a chair using your arms (e.g., wheelchair or bedside chair)?: A Little Help needed to walk in hospital room?: A Little Help needed climbing 3-5 steps with a railing? : A Little 6 Click Score: 21    End of Session Equipment Utilized During Treatment: Oxygen;Gait belt Activity Tolerance: Patient tolerated treatment well Patient left: in bed;with call bell/phone within reach;with nursing/sitter in room(Pt declined up in chair; sitter present) Nurse Communication: Mobility status;Other (comment)(Pt's HR to 145 with amb) PT Visit Diagnosis: Unsteadiness on feet (R26.81);Muscle weakness (generalized) (M62.81);Difficulty in walking, not elsewhere classified (R26.2)     Time: 3291-9166 PT Time Calculation (min) (ACUTE ONLY): 23 min  Charges:  $Gait Training: 8-22 mins $Therapeutic Exercise: 8-22 mins                     D. Scott Tamila Gaulin PT, DPT 03/12/18, 12:19 PM

## 2018-03-12 NOTE — Clinical Social Work Note (Addendum)
CSW spoke with APS case worker is [name], [phone] she asked how patient was doing today.  CSW informed her that patient is alert and oriented today, and he spoke to the case manager and is planning to go to his nephew's house and will discharge with home health along with a hospital bed per request from nephew. Per APS patient should not return to girlfriend's house due to unsafe conditions, patient may possibly need legal guardian.  APS is requesting medical records to continue investigation.  CSW continuing to follow patient's progress and APS will continue to follow after discharge.  [name]. [name], MSW, [place]  03/12/2018 4:32 PM

## 2018-03-12 NOTE — Consult Note (Signed)
[place]  Telephone:([phone] [phone]   Name: [name] Date: 03/12/2018 MRN: [id number]  DOB: 1960-11-27  Patient Care Team: Petra Kuba, MD as PCP - General (Family Medicine)    REASON FOR CONSULTATION: Palliative Care consult requested for this 58 y.o. male with multiple medical problems including recurrent multiple myeloma with multiple PET positive bone lesions most recently treated on [place], O2 dependent COPD, diastolic dysfunction with history of CHF, atrial septal defect, and tobacco abuse.  Patient was admitted to the hospital on 03/08/2018 under involuntary commitment for agitation and hallucinations.  Initially, patient was thought to have had an acute CVA on head CT but reimaging appeared consistent with artifact.  Patient's hospitalization has been complicated by persistent agitation requiring a sitter.  He has been seen by both neurology and psychiatry and AMS thought probably related to medications.  Palliative care was consulted to help address goals.  SOCIAL HISTORY:     reports that he has been smoking cigarettes. He has a 20.00 pack-year smoking history. He has never used smokeless tobacco. He reports that he does not drink alcohol or use drugs.  ADVANCE DIRECTIVES:  Patient is not married but lives at home with his significant other and several other family members.  Patient has 2 sons and a daughter.  Daughter lives in [place].  CODE STATUS: Full code  PAST MEDICAL HISTORY: Past Medical History:  Diagnosis Date  . Anxiety   . Arthritis   . Atrial septal defect   . CHF (congestive heart failure) ([place])   . COPD (chronic obstructive pulmonary disease) ([place])   . Coronary artery disease   . Dysrhythmias   . GERD (gastroesophageal reflux disease)   . Hyperlipidemia   . Multiple myeloma ([place])   . Multiple myeloma ([place])   . Myocardial infarction ([place])   . Substance abuse ([place])      PAST SURGICAL HISTORY:  Past Surgical History:  Procedure Laterality Date  . CARDIAC DEFIBRILLATOR PLACEMENT      HEMATOLOGY/ONCOLOGY HISTORY:  Oncology History   # SEP 2015- MULTIPLE MYELOMA [multiple PET pos Bone lesions; hypercalcemia s/p BMBx; FISH- Aneuploidy - gain of chromosome 7,9,15,FGFR3/4p16.3, and CCND1/11q13. Loss of MAF/16q23.1), cytogenetics normal 46XY] s/p Vel-Dex-Rev-Zometa; Excellent PR;   # MARCH 2016- REV-DEX ; FEB 2017-[discn Dex] Rev 25 mg 3 w On & 1 w Off; March 2017- M- protein 0.4gm/dl; K/L= 7.9; cont Rev;   # Revlimid HELD June 2018 [sec to [facility]]  # AUG 2019- RECURRENCE- NINLARO-DEX  # March 2017-  Left Middle Lobe cavitary lesion- ~24m- repeat Ct in 3-459m# AUG 2017- ON[facility] [[name], [place] #  ? [facility]- [phone] not follow up ]- DISCONT Zometa.; NO HBO sec to   # Chronic pain/Anxiety [ortho]  # COPD/smoking/ [[facility]- not candidate for BMT- sec to co-morbidities/poor nutritional status];     ------------------------------------------------------------------  DIAGNOSIS: MULTIPLE MYELOMA  STAGE: RECURRENT  ;GOALS: CONTROL  CURRENT/MOST RECENT THERAPY; AUG 2019- NINLARO-DEX      Multiple myeloma in relapse ([place]   ALLERGIES:  has No Known Allergies.  MEDICATIONS:  Current Facility-Administered Medications  Medication Dose Route Frequency Provider Last Rate Last Dose  . acetaminophen (TYLENOL) tablet 650 mg  650 mg Oral Q4H PRN Sudini, SrAlveta HeimlichMD       Or  . acetaminophen (TYLENOL) solution 650 mg  650 mg Per Tube Q4H PRN SuHillary BowMD       Or  .  acetaminophen (TYLENOL) suppository 650 mg  650 mg Rectal Q4H PRN Sudini, Alveta Heimlich, MD      . acetaminophen (TYLENOL) tablet 650 mg  650 mg Oral Q6H PRN Merlyn Lot, MD   650 mg at 03/12/18 0854  . albuterol (PROVENTIL) (2.5 MG/3ML) 0.083% nebulizer solution 2.5 mg  2.5 mg Inhalation Q6H PRN Merlyn Lot, MD      . ALPRAZolam Duanne Moron) tablet 0.5 mg  0.5 mg Oral TID PRN Merlyn Lot, MD   0.5 mg at 03/12/18 1009  . aspirin EC tablet 81 mg  81 mg Oral Daily Lang Snow, NP   81 mg at 03/12/18 1003  . atorvastatin (LIPITOR) tablet 40 mg  40 mg Oral q1800 Hillary Bow, MD   40 mg at 03/11/18 1826  . clopidogrel (PLAVIX) tablet 75 mg  75 mg Oral Daily Lang Snow, NP   75 mg at 03/12/18 1003  . digoxin (LANOXIN) tablet 0.25 mg  0.25 mg Oral Daily Neoma Laming A, MD   0.25 mg at 03/12/18 1004  . diphenhydrAMINE (BENADRYL) capsule 50 mg  50 mg Oral QHS Lavella Hammock, MD   50 mg at 03/11/18 2145  . DULoxetine (CYMBALTA) DR capsule 30 mg  30 mg Oral Daily Hillary Bow, MD   30 mg at 03/12/18 1004  . enoxaparin (LOVENOX) injection 40 mg  40 mg Subcutaneous Q24H Hillary Bow, MD   40 mg at 03/12/18 1259  . feeding supplement (ENSURE ENLIVE) (ENSURE ENLIVE) liquid 237 mL  237 mL Oral TID BM Sudini, Srikar, MD      . fentaNYL (DURAGESIC) 100 MCG/HR 1 patch  1 patch Transdermal Q48H Merlyn Lot, MD   1 patch at 03/11/18 1833  . fentaNYL (DURAGESIC) 25 MCG/HR 1 patch  1 patch Transdermal Q48H Merlyn Lot, MD   1 patch at 03/11/18 1827  . fluticasone furoate-vilanterol (BREO ELLIPTA) 200-25 MCG/INH 1 puff  1 puff Inhalation Daily Merlyn Lot, MD   1 puff at 03/12/18 1003  . haloperidol (HALDOL) tablet 1 mg  1 mg Oral Q6H PRN Lavella Hammock, MD      . haloperidol (HALDOL) tablet 5 mg  5 mg Oral QHS Lavella Hammock, MD   5 mg at 03/11/18 2145  . haloperidol lactate (HALDOL) injection 5 mg  5 mg Intramuscular Q6H PRN Loney Hering D, MD   5 mg at 03/11/18 0206  . ipratropium-albuterol (DUONEB) 0.5-2.5 (3) MG/3ML nebulizer solution 3 mL  3 mL Nebulization Q4H PRN Merlyn Lot, MD      . megestrol (MEGACE) 400 MG/10ML suspension 320 mg  320 mg Oral BID Hillary Bow, MD   320 mg at 03/12/18 1003  . metoprolol tartrate (LOPRESSOR) tablet 50 mg  50 mg Oral BID Arta Silence, MD   50 mg at 03/12/18 1003  . multivitamin with  minerals tablet 1 tablet  1 tablet Oral Daily Hillary Bow, MD   1 tablet at 03/12/18 1003  . nicotine (NICODERM CQ - dosed in mg/24 hours) patch 14 mg  14 mg Transdermal Daily Hillary Bow, MD   14 mg at 03/11/18 1804  . senna-docusate (Senokot-S) tablet 1 tablet  1 tablet Oral QHS PRN Sudini, Alveta Heimlich, MD      . tamsulosin (FLOMAX) capsule 0.4 mg  0.4 mg Oral Daily Sudini, Srikar, MD   0.4 mg at 03/12/18 1003  . tiotropium (SPIRIVA) inhalation capsule ([facility] use ONLY) 18 mcg  18 mcg Inhalation Daily Hillary Bow, MD   18 mcg  at 03/12/18 1004    VITAL SIGNS: BP 111/83 (BP Location: Right Arm)   Pulse (!) 118   Temp 98.4 F (36.9 C) (Oral)   Resp 18   Ht 6' (1.829 m)   Wt 125 lb (56.7 kg)   SpO2 95%   BMI 16.95 kg/m  Filed Weights   03/07/18 1556 03/08/18 1440  Weight: 134 lb 7.7 oz (61 kg) 125 lb (56.7 kg)    Estimated body mass index is 16.95 kg/m as calculated from the following:   Height as of this encounter: 6' (1.829 m).   Weight as of this encounter: 125 lb (56.7 kg).  LABS: CBC:    Component Value Date/Time   WBC 10.9 (H) 03/10/2018 0409   HGB 9.6 (L) 03/10/2018 0409   HGB 10.6 (L) 05/12/2014 1343   HCT 30.2 (L) 03/10/2018 0409   HCT 30.9 (L) 05/12/2014 1343   PLT 190 03/10/2018 0409   PLT 258 05/12/2014 1343   MCV 88.8 03/10/2018 0409   MCV 92 05/12/2014 1343   NEUTROABS 8.2 (H) 03/10/2018 0409   NEUTROABS 3.6 05/12/2014 1343   LYMPHSABS 1.0 03/10/2018 0409   LYMPHSABS 1.2 05/12/2014 1343   MONOABS 1.4 (H) 03/10/2018 0409   MONOABS 0.7 05/12/2014 1343   EOSABS 0.3 03/10/2018 0409   EOSABS 0.2 05/12/2014 1343   BASOSABS 0.0 03/10/2018 0409   BASOSABS 0.1 05/12/2014 1343   Comprehensive Metabolic Panel:    Component Value Date/Time   NA 139 03/10/2018 0409   NA 135 05/12/2014 1343   K 3.6 03/10/2018 0409   K 3.3 (L) 05/12/2014 1343   CL 108 03/10/2018 0409   CL 101 05/12/2014 1343   CO2 27 03/10/2018 0409   CO2 27 05/12/2014 1343   BUN 6  03/10/2018 0409   BUN 9 05/12/2014 1343   CREATININE 0.54 (L) 03/10/2018 0409   CREATININE 0.89 05/12/2014 1343   GLUCOSE 94 03/10/2018 0409   GLUCOSE 77 05/12/2014 1343   CALCIUM 8.0 (L) 03/10/2018 0409   CALCIUM 8.8 (L) 05/12/2014 1343   AST 18 03/10/2018 0409   AST 9 (L) 02/17/2014 1405   ALT 7 03/10/2018 0409   ALT 12 (L) 02/17/2014 1405   ALKPHOS 41 03/10/2018 0409   ALKPHOS 44 (L) 02/17/2014 1405   BILITOT 0.8 03/10/2018 0409   BILITOT 0.2 02/17/2014 1405   PROT 5.7 (L) 03/10/2018 0409   PROT 5.9 (L) 02/17/2014 1405   ALBUMIN 2.8 (L) 03/10/2018 0409   ALBUMIN 2.8 (L) 02/17/2014 1405    RADIOGRAPHIC STUDIES: Ct Angio Head W Or Wo Contrast  Result Date: 03/09/2018 CLINICAL DATA:  Altered mental status. Possible right frontal lobe infarct on CT. EXAM: CT ANGIOGRAPHY HEAD AND NECK TECHNIQUE: Multidetector CT imaging of the head and neck was performed using the standard protocol during bolus administration of intravenous contrast. Multiplanar CT image reconstructions and MIPs were obtained to evaluate the vascular anatomy. Carotid stenosis measurements (when applicable) are obtained utilizing NASCET criteria, using the distal internal carotid diameter as the denominator. CONTRAST:  73m OMNIPAQUE IOHEXOL 350 MG/ML SOLN COMPARISON:  Head CT 03/08/2018. Carotid Doppler ultrasound 05/29/2012. FINDINGS: CT HEAD FINDINGS Brain: No definite acute infarct, intracranial hemorrhage, mass, midline shift, or extra-axial fluid collection is identified. The focal region of hypodensity in the right frontal lobe described on yesterday's CT is not clearly persistent on today's study and may have reflected volume averaging through a sulcus. There is mild cerebral atrophy. Scattered cerebral white matter hypodensities are nonspecific but compatible  with mild chronic small vessel ischemic disease. Vascular: Calcified atherosclerosis at the skull base. No hyperdense vessel. Skull: No fracture or focal osseous  lesion. Sinuses: Right maxillary sinus mucous retention cyst. Clear mastoid air cells. Orbits: Unremarkable. Review of the MIP images confirms the above findings CTA NECK FINDINGS Aortic arch: Standard 3 vessel aortic arch with widely patent arch vessel origins. Right carotid system: Patent with mild calcified plaque at the carotid bifurcation. No evidence of significant stenosis or dissection. Left carotid system: Patent with moderate calcified plaque at the carotid bifurcation. No evidence of significant stenosis or dissection. Tortuous distal cervical ICA. Vertebral arteries: Patent without evidence of significant stenosis or dissection. Moderately dominant left vertebral artery. Skeleton: Poor dentition with multiple dental caries and missing teeth. Other neck: No evidence of acute abnormality or mass. Upper chest: Emphysema. Review of the MIP images confirms the above findings CTA HEAD FINDINGS Anterior circulation: The internal carotid arteries are patent from skull base to carotid termini with nonstenotic plaque bilaterally. ACAs and MCAs are patent without evidence of proximal branch occlusion or significant proximal stenosis. No aneurysm is identified. Posterior circulation: The intracranial vertebral arteries are patent to the basilar. Patent PICA and SCA origins are identified bilaterally. The basilar artery is patent with a mild stenosis in its midportion. There are moderately large posterior communicating arteries bilaterally with hypoplasia of the P1 segments. No flow limiting proximal PCA stenosis is evident. No aneurysm is identified. Venous sinuses: Patent. Anatomic variants: Predominantly fetal origin of the PCAs. Delayed phase: No abnormal enhancement. Review of the MIP images confirms the above findings IMPRESSION: 1. Intracranial atherosclerosis without major branch occlusion or flow limiting proximal stenosis. 2. Widely patent cervical carotid and vertebral arteries. 3. No evidence of acute  infarct or hemorrhage on noncontrast head CT. 4.  Emphysema (ICD10-J43.9). Electronically Signed   By: Logan Bores M.D.   On: 03/09/2018 14:06   Ct Head Wo Contrast  Result Date: 03/08/2018 CLINICAL DATA:  Altered mental status with hallucinations and agitation EXAM: CT HEAD WITHOUT CONTRAST TECHNIQUE: Contiguous axial images were obtained from the base of the skull through the vertex without intravenous contrast. COMPARISON:  January 03, 2018 FINDINGS: Brain: The ventricles are normal in size and configuration. There is no demonstrable mass, hemorrhage, extra-axial fluid collection, or midline shift. There is a focal area of decreased attenuation in the periphery of the posterior mid right frontal lobe, concerning for potential early infarct. Elsewhere brain parenchyma appears unremarkable. Vascular: No appreciable hyperdense vessel. There is calcification in each cavernous carotid artery region. Skull: The bony calvarium appears intact. Sinuses/Orbits: There is mucosal thickening in several ethmoid air cells. There is a retention cyst in the inferior right maxillary antrum. Orbits appear symmetric bilaterally. Other: Mastoid air cells are clear. IMPRESSION: Decreased attenuation in the posterior mid right frontal lobe, best appreciated on axial slice 24 series 7. Question early infarct in this area. Brain parenchyma elsewhere appears unremarkable. No mass or hemorrhage. There are foci of arterial vascular calcification. There are areas of paranasal sinus disease. Electronically Signed   By: Lowella Grip III M.D.   On: 03/08/2018 07:38   Ct Angio Neck W Or Wo Contrast  Result Date: 03/09/2018 CLINICAL DATA:  Altered mental status. Possible right frontal lobe infarct on CT. EXAM: CT ANGIOGRAPHY HEAD AND NECK TECHNIQUE: Multidetector CT imaging of the head and neck was performed using the standard protocol during bolus administration of intravenous contrast. Multiplanar CT image reconstructions and  MIPs were obtained to evaluate  the vascular anatomy. Carotid stenosis measurements (when applicable) are obtained utilizing NASCET criteria, using the distal internal carotid diameter as the denominator. CONTRAST:  41m OMNIPAQUE IOHEXOL 350 MG/ML SOLN COMPARISON:  Head CT 03/08/2018. Carotid Doppler ultrasound 05/29/2012. FINDINGS: CT HEAD FINDINGS Brain: No definite acute infarct, intracranial hemorrhage, mass, midline shift, or extra-axial fluid collection is identified. The focal region of hypodensity in the right frontal lobe described on yesterday's CT is not clearly persistent on today's study and may have reflected volume averaging through a sulcus. There is mild cerebral atrophy. Scattered cerebral white matter hypodensities are nonspecific but compatible with mild chronic small vessel ischemic disease. Vascular: Calcified atherosclerosis at the skull base. No hyperdense vessel. Skull: No fracture or focal osseous lesion. Sinuses: Right maxillary sinus mucous retention cyst. Clear mastoid air cells. Orbits: Unremarkable. Review of the MIP images confirms the above findings CTA NECK FINDINGS Aortic arch: Standard 3 vessel aortic arch with widely patent arch vessel origins. Right carotid system: Patent with mild calcified plaque at the carotid bifurcation. No evidence of significant stenosis or dissection. Left carotid system: Patent with moderate calcified plaque at the carotid bifurcation. No evidence of significant stenosis or dissection. Tortuous distal cervical ICA. Vertebral arteries: Patent without evidence of significant stenosis or dissection. Moderately dominant left vertebral artery. Skeleton: Poor dentition with multiple dental caries and missing teeth. Other neck: No evidence of acute abnormality or mass. Upper chest: Emphysema. Review of the MIP images confirms the above findings CTA HEAD FINDINGS Anterior circulation: The internal carotid arteries are patent from skull base to carotid termini  with nonstenotic plaque bilaterally. ACAs and MCAs are patent without evidence of proximal branch occlusion or significant proximal stenosis. No aneurysm is identified. Posterior circulation: The intracranial vertebral arteries are patent to the basilar. Patent PICA and SCA origins are identified bilaterally. The basilar artery is patent with a mild stenosis in its midportion. There are moderately large posterior communicating arteries bilaterally with hypoplasia of the P1 segments. No flow limiting proximal PCA stenosis is evident. No aneurysm is identified. Venous sinuses: Patent. Anatomic variants: Predominantly fetal origin of the PCAs. Delayed phase: No abnormal enhancement. Review of the MIP images confirms the above findings IMPRESSION: 1. Intracranial atherosclerosis without major branch occlusion or flow limiting proximal stenosis. 2. Widely patent cervical carotid and vertebral arteries. 3. No evidence of acute infarct or hemorrhage on noncontrast head CT. 4.  Emphysema (ICD10-J43.9). Electronically Signed   By: ALogan BoresM.D.   On: 03/09/2018 14:06   Ct Abdomen Pelvis W Contrast  Result Date: 02/15/2018 CLINICAL DATA:  Elevated white blood cell count EXAM: CT ABDOMEN AND PELVIS WITH CONTRAST TECHNIQUE: Multidetector CT imaging of the abdomen and pelvis was performed using the standard protocol following bolus administration of intravenous contrast. CONTRAST:  1011mOMNIPAQUE IOHEXOL 300 MG/ML  SOLN COMPARISON:  11/05/2017 and 05/23/2016 FINDINGS: Lower chest: There is consolidation at the posterior right lung base centrilobular nodules with a tree-in-bud pattern are present at both posterior lung bases. Hepatobiliary: Tiny hypodensities in the left lobe of the liver are stable. Gallbladder is decompressed. Pancreas: Unremarkable Spleen: Unremarkable Adrenals/Urinary Tract: Adrenal glands are within normal limits. Tiny hypodensity in the lower pole of both kidneys are not significantly changed.  Bladder is within normal limits. Stomach/Bowel: There is narrowing of the duodenum as it passes posterior to the SMA. There is also dilatation of the proximal duodenum. Fluid-filled loops of small and large bowel are present in the abdomen without disproportionate dilatation. The transverse and descending  colon are relatively decompressed with avid enhancement of the mucosa. No pneumatosis. No stranding in the adjacent fat. Several fluid-filled small bowel loops in the mid and lower abdomen exhibit prominence of the folds. Again, there is no pneumatosis or stranding in the adjacent fat. The appendix is also borderline prominent measuring 9 mm in caliber. There is no stranding in the adjacent fat. Vascular/Lymphatic: Atherosclerotic vascular calcifications of the aorta and iliac arteries. There is no abnormal retroperitoneal adenopathy. Reproductive: Normal prostate. Other: No free-fluid.  No free intraperitoneal gas. Musculoskeletal: Compression fractures of T12, L1, and L2 are stable. A lytic lesion in the central sacrum on image 45 is stable. Multiple suspected lytic lesions throughout the visualized spine are not significantly changed. IMPRESSION: There is consolidation at the posterior right lung base. Tree-in-bud centrilobular nodules are also present at the lung bases. These findings are worrisome for an inflammatory process and could account for the leukocytosis. Small and large bowel loops have an abnormal appearance in the abdomen. Transverse and descending colon mucosa is avidly enhancing. Lower abdominal small bowel folds are prominent. The appendix is also borderline prominent. These findings suggest a diffuse process such as enteritis or possibly volume overload. A focal inflammatory process of bowel is not excluded. Dilatation of the duodenum with narrowing has a crosses posterior to the SMA. Nutcracker syndrome is not excluded. Multiple lytic lesions are not significantly changed. Thoracolumbar  compression fractures are also stable. Aortic Atherosclerosis (ICD10-I70.0). Electronically Signed   By: Marybelle Killings M.D.   On: 02/15/2018 13:26   US Carotid Bilateral (at [facility] Only)  Result Date: 03/09/2018 CLINICAL DATA:  CVA.  History of CAD, hyperlipidemia and smoking. EXAM: BILATERAL CAROTID DUPLEX ULTRASOUND TECHNIQUE: Pearline Cables scale imaging, color Doppler and duplex ultrasound were performed of bilateral carotid and vertebral arteries in the neck. COMPARISON:  None. FINDINGS: Criteria: Quantification of carotid stenosis is based on velocity parameters that correlate the residual internal carotid diameter with NASCET-based stenosis levels, using the diameter of the distal internal carotid lumen as the denominator for stenosis measurement. The following velocity measurements were obtained: RIGHT ICA: 112/29 cm/sec CCA: 32/95 cm/sec SYSTOLIC ICA/CCA RATIO:  1.1 ECA: 221 cm/sec LEFT ICA: 102/36 cm/sec CCA: 18/84 cm/sec SYSTOLIC ICA/CCA RATIO:  1.2 ECA: 104 cm/sec RIGHT CAROTID ARTERY: There is a moderate amount of eccentric echogenic plaque within the right carotid bulb (image 16), not resulting in elevated peak systolic velocities within the interrogated course the right internal carotid artery to suggest a hemodynamically significant stenosis. RIGHT VERTEBRAL ARTERY:  Antegrade Flow LEFT CAROTID ARTERY: There is a moderate to large amount of eccentric mixed echogenic plaque within the left carotid bulb (images 48 and 49), extending to involve the origin and proximal aspects of the left internal carotid artery (image 56), not resulting in elevated peak systolic velocities within the interrogated course of the left internal carotid artery to suggest a hemodynamically significant stenosis. LEFT VERTEBRAL ARTERY:  Antegrade Flow Note is made of an apparent cardiac arrhythmia. IMPRESSION: 1. Moderate to large amount of bilateral atherosclerotic plaque, left greater than right, not resulting in a  hemodynamically significant stenosis within either internal carotid artery. 2. Apparent cardiac arrhythmia. Further evaluation with ECG monitoring could be performed as clinically indicated. Electronically Signed   By: Sandi Mariscal M.D.   On: 03/09/2018 09:59   Dg Chest Port 1 View  Result Date: 02/16/2018 CLINICAL DATA:  CHF. EXAM: PORTABLE CHEST 1 VIEW COMPARISON:  Radiographs 01/03/2018. Lung bases from abdominal CT earlier this  day. FINDINGS: Single lead left-sided pacemaker in place. Unchanged heart size and mediastinal contours. No pulmonary edema or large pleural effusion. Chronic hyperinflation is stable from prior. Right lower lobe consolidation and tree in bud pattern in both lung bases on abdominal CT not well demonstrated radiographically. No acute airspace disease or pneumothorax. No acute osseous abnormalities are seen. IMPRESSION: Lung base opacities on CT are not well demonstrated radiographically. Chronic hyperinflation, stable from prior. No evidence of CHF. Electronically Signed   By: Keith Rake M.D.   On: 02/16/2018 02:15    PERFORMANCE STATUS (ECOG) : 1 - Symptomatic but completely ambulatory  Review of Systems As noted above. Otherwise, a complete review of systems is negative.  Physical Exam General: NAD, frail appearing, thin Cardiovascular: regular rate and rhythm Pulmonary: clear ant fields Abdomen: soft, nontender, + bowel sounds GU: no suprapubic tenderness Extremities: no edema, no joint deformities Skin: no rashes Neurological: Weakness, some confusion  IMPRESSION: Patient remains intermittently confused.  Sitter at bedside.  However, I suspect that patient's mental status is improving.  He was able to come up with right answers to questions when given a few minutes.  He knew he was in the hospital in [place].  He knew his oncologist was Dr. Rogue Bussing. He thought it was January 2020 but says he is hoping to go home before it snows (apparently weather  forecast is calling for possible flurries later this week).   Patient tells me that his daughter lives in [place] and that she is not fond of his girlfriend.  Patient was in agreement with me calling his daughter but suggested that he would want his girlfriend involved in decision-making.  I called and spoke with patient's daughter.  She verbalized multiple concerns regarding patient's home situation.  She says that the home is in a severe state of disrepair.  In the home is patient, his girlfriend and her 1-monthold child, his previous girlfriend's (she is now deceased) son, and his daughter-in-law and her child. Apparently patient is supposed to be the primary caregiver for his ex-girlfriend's son but family is concerned that patient is not able to fully care for himself let alone another person. Family is also concerned about reports of ongoing drug use and diversion in the home.   Daughter says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

## 2018-03-12 NOTE — Progress Notes (Signed)
Patient's daughter called.  This RN updated her on the patient's progress that he got some sleep last night after being given meds, but was still fidgety and attempting to get up out of bed and was also confused upon waking.  Patient finally relaxed and rested around 0400.

## 2018-03-12 NOTE — Progress Notes (Signed)
[facility] [place] NAME: [name]    MR#:  [id number]  DATE OF BIRTH:  Jun 08, 1960  SUBJECTIVE:  CHIEF COMPLAINT:   Chief Complaint  Patient presents with  . Hallucinations   Alert and oriented today.  REVIEW OF SYSTEMS:    Review of Systems  Constitutional: Positive for malaise/fatigue. Negative for chills and fever.  HENT: Negative for sore throat.   Eyes: Negative for blurred vision, double vision and pain.  Respiratory: Negative for cough, hemoptysis, shortness of breath and wheezing.   Cardiovascular: Negative for chest pain, palpitations, orthopnea and leg swelling.  Gastrointestinal: Negative for abdominal pain, constipation, diarrhea, heartburn, nausea and vomiting.  Genitourinary: Negative for dysuria and hematuria.  Musculoskeletal: Negative for back pain and joint pain.  Skin: Positive for itching. Negative for rash.  Neurological: Negative for sensory change, speech change, focal weakness and headaches.  Endo/Heme/Allergies: Does not bruise/bleed easily.  Psychiatric/Behavioral: Negative for depression. The patient is not nervous/anxious.    DRUG ALLERGIES:  No Known Allergies  VITALS:  Blood pressure (!) 89/61, pulse 97, temperature 98.6 F (37 C), temperature source Oral, resp. rate 19, height 6' (1.829 m), weight 56.7 kg, SpO2 94 %.  PHYSICAL EXAMINATION:   Physical Exam  GENERAL:  58 y.o.-year-old patient lying in the bed with no acute distress.  EYES: Pupils equal, round, reactive to light and accommodation. No scleral icterus. Extraocular muscles intact.  HEENT: Head atraumatic, normocephalic. Oropharynx and nasopharynx clear.  NECK:  Supple, no jugular venous distention. No thyroid enlargement, no tenderness.  LUNGS: Normal breath sounds bilaterally, no wheezing, rales, rhonchi. No use of accessory muscles of respiration. CARDIOVASCULAR: S1, S2 .  Tachycardia. ABDOMEN: Soft, nontender, nondistended. Bowel  sounds present. No organomegaly or mass. EXTREMITIES: No cyanosis, clubbing or edema b/l. NEUROLOGIC: Not following commands PSYCHIATRIC: The patient is alert and oriented SKIN: No obvious rash, lesion, or ulcer.   LABORATORY PANEL:   CBC Recent Labs  Lab 03/10/18 0409  WBC 10.9*  HGB 9.6*  HCT 30.2*  PLT 190   ------------------------------------------------------------------------------------------------------------------ Chemistries  Recent Labs  Lab 03/10/18 0409  NA 139  K 3.6  CL 108  CO2 27  GLUCOSE 94  BUN 6  CREATININE 0.54*  CALCIUM 8.0*  MG 1.7  AST 18  ALT 7  ALKPHOS 41  BILITOT 0.8   ------------------------------------------------------------------------------------------------------------------  Cardiac Enzymes Recent Labs  Lab 03/10/18 2224  TROPONINI <0.03   ------------------------------------------------------------------------------------------------------------------  RADIOLOGY:  No results found.   ASSESSMENT AND PLAN:   *SVT.   On metoprolol.  Digoxin scheduled daily added by cardiology. HR - 80s  *Acute CVA of right frontal lobe?  CTA does not show any acute findings. Unable to get MRI due to defibrillator Echocardiogram normal Aspirin and statin. - Lovenox for DVT prophylaxis. - PT/OT/Speech consult as needed per symptoms Discussed with neurology team. Not clear if this is CVA but recommendation to treat with ASA, Plavix and statin.  *Acute encephalopathy.  Agitated overnight. Likely psychosis/sundowning. Haldol QHS in hospital.  *Hypertension.  On metoprolol  *COPD with chronic respiratory failure.  On 2-3 L oxygen.  No wheezing. Continue home inhalers and nebulizers as needed.  *Multiple myeloma.  Follows outpatient with cancer center.  * Urinary retention Foley removed but patient still has not voided.  Liekly d/c in AM after voiding and family able to pick up.   Discussed with Daughter over  phone.  DVT prophylaxis Lovenox  All the records are reviewed and  case discussed with Care Management/Social Worker Management plans discussed with the patient, family and they are in agreement.  CODE STATUS: FULL CODE  DVT Prophylaxis: SCDs  TOTAL TIME TAKING CARE OF THIS PATIENT: 35 minutes.   POSSIBLE D/C IN 1-2 DAYS, DEPENDING ON CLINICAL CONDITION.  [name] M.D on 03/12/2018 at 9:35 PM  Between 7am to 6pm - Pager - [phone]  After 6pm go to [email or web address] - password [email or web address] [facility]  Office  [phone]  CC: Primary care physician; Petra Kuba, MD  Note: This dictation was prepared with Dragon dictation along with smaller phrase technology. Any transcriptional errors that result from this process are unintentional.

## 2018-03-12 NOTE — Evaluation (Signed)
Occupational Therapy Evaluation Patient Details Name: [name] MRN: [id number] DOB: Dec 05, 1960 Today's Date: 03/12/2018    History of Present Illness 58 y.o. male with a known history of CAD, multiple myeloma chronic back pain, COPD, chronic respiratory failure on 2 L oxygen, diastolic CHF, atrial septal defect, tobacco use presents to the hospital brought in by his girlfriend due to hallucination and agitation at home.  IVC was placed.  Initially thought to be psychosis.  CT scan of the head shows questionable right frontal CVA.     Clinical Impression   Pt seen for OT evaluation this date. Pt was independent in all ADLs and mobility, living in a 1 story home with his fiance, fiance's 56yo son, pt's son and ex-wife, and pt's 37mograndson. Pt on 3 liters of O2 at home. Pt reports becoming easily fatigued or out of breath with moderate exertion, requiring occasional assist for housekeeping tasks and has endorsed that showers are difficulty for him. Pt currently requires minimal assist for LB ADL and sup-CGA for mobility due to poor activity tolerance. Pt follows all commands, alert and orientedx3. Pt educated in energy conservation conservation strategies including pursed lip breathing, activity pacing, home/routines modifications, work simplification, AE/DME, prioritizing of meaningful occupations, and falls prevention. Will provide handout next session. Pt verbalized understanding but would benefit from additional skilled OT services to maximize recall and carryover of learned techniques and facilitate implementation of learned techniques into daily routines. Upon discharge, recommend HHOT services and 24/7 supervision for safety.    Follow Up Recommendations  Home health OT;Supervision/Assistance - 24 hour    Equipment Recommendations  Other (comment)(TBD)    Recommendations for Other Services       Precautions / Restrictions Precautions Precautions: Fall Restrictions Weight Bearing  Restrictions: No      Mobility Bed Mobility Overal bed mobility: Modified Independent Bed Mobility: Supine to Sit;Sit to Supine     Supine to sit: Modified independent (Device/Increase time) Sit to supine: Modified independent (Device/Increase time)   General bed mobility comments: additional time/effort but able to do without assist  Transfers Overall transfer level: Needs assistance Equipment used: Rolling walker (2 wheeled) Transfers: Sit to/from Stand Sit to Stand: Min guard;From elevated surface         General transfer comment: Extra effort required during sit to/from stand from an elevated EOB but no physical assistance required    Balance Overall balance assessment: Needs assistance Sitting-balance support: Feet supported;Bilateral upper extremity supported Sitting balance-Leahy Scale: Good     Standing balance support: No upper extremity supported Standing balance-Leahy Scale: Fair                             ADL either performed or assessed with clinical judgement   ADL Overall ADL's : Needs assistance/impaired                                       General ADL Comments: MIN A for LB ADL, CGA for toilet t/f with BSC frame over toilet     Vision Baseline Vision/History: Wears glasses Wears Glasses: At all times Patient Visual Report: No change from baseline       Perception     Praxis      Pertinent Vitals/Pain Pain Assessment: No/denies pain     Hand Dominance Right   Extremity/Trunk Assessment Upper Extremity Assessment  Upper Extremity Assessment: Overall WFL for tasks assessed   Lower Extremity Assessment Lower Extremity Assessment: Generalized weakness   Cervical / Trunk Assessment Cervical / Trunk Assessment: Kyphotic   Communication Communication Communication: No difficulties   Cognition Arousal/Alertness: Awake/alert Behavior During Therapy: WFL for tasks assessed/performed Overall Cognitive Status:  Within Functional Limits for tasks assessed                                     General Comments       Exercises Other Exercises Other Exercises: pt instructed in energy conservation strategies to maximize safety/indep in the home, including activity pacing, AE/DME, pursed lip breathing, work simplification   Shoulder Instructions      Home Living Family/patient expects to be discharged to:: Private residence Living Arrangements: Spouse/significant other;Non-relatives/Friends Available Help at Discharge: Family;Available 24 hours/day Type of Home: House Home Access: Stairs to enter [employer] of Steps: 3 Entrance Stairs-Rails: None Home Layout: One level     Bathroom Shower/Tub: Teacher, early years/pre: Standard     Home Equipment: Environmental consultant - 2 wheels;Bedside commode;Grab bars - tub/shower          Prior Functioning/Environment Level of Independence: Independent with assistive device(s)        Comments: Pt reports he can walk w/o AD, out of the house some but has difficulty with longer distances or standing for long periods of time. 3L O2 at home. No falls in past 12 months.        OT Problem List: Decreased strength;Decreased knowledge of use of DME or AE;Decreased safety awareness;Impaired balance (sitting and/or standing);Decreased cognition      OT Treatment/Interventions: Self-care/ADL training;Balance training;Therapeutic exercise;Therapeutic activities;DME and/or AE instruction;Cognitive remediation/compensation;Patient/family education    OT Goals(Current goals can be found in the care plan section) Acute Rehab OT Goals Patient Stated Goal: pt wants to return home and recover OT Goal Formulation: With patient/family Time For Goal Achievement: 03/26/18 Potential to Achieve Goals: Good ADL Goals Pt Will Perform Lower Body Dressing: with modified independence;sit to/from stand(using learned ECS) Pt Will Transfer to Toilet:  with supervision;ambulating(elevated toilet, LRAD for amb) Additional ADL Goal #1: Pt will utilize pursed lip breathing during exertional activity to support participation and minimize SOB with PRN verbal cues to use. Additional ADL Goal #2: Pt will verbalize plan to implement at least 2 learned ECS to maximize safety and independence in the home.  OT Frequency: Min 1X/week   Barriers to D/C:            Co-evaluation              AM-PAC OT "6 Clicks" Daily Activity     Outcome Measure Help from another person eating meals?: None Help from another person taking care of personal grooming?: None Help from another person toileting, which includes using toliet, bedpan, or urinal?: A Little Help from another person bathing (including washing, rinsing, drying)?: A Little Help from another person to put on and taking off regular upper body clothing?: None Help from another person to put on and taking off regular lower body clothing?: A Little 6 Click Score: 21   End of Session    Activity Tolerance: Patient tolerated treatment well Patient left: in bed;with call bell/phone within reach;with bed alarm set  OT Visit Diagnosis: Other abnormalities of gait and mobility (R26.89);Muscle weakness (generalized) (M62.81)  Time: 7782-4235 OT Time Calculation (min): 34 min Charges:  OT General Charges $OT Visit: 1 Visit OT Evaluation $OT Eval Low Complexity: 1 Low OT Treatments $Self Care/Home Management : 23-37 mins  Jeni Salles, MPH, MS, OTR/L ascom [phone] 03/12/18, 2:24 PM

## 2018-03-12 NOTE — Progress Notes (Signed)
SUBJECTIVE: Patient is feeling much better   Vitals:   03/11/18 1758 03/11/18 2007 03/11/18 2144 03/12/18 0645  BP: 109/74 92/74 112/77 (!) 132/92  Pulse: (!) 103 92 (!) 116 99  Resp:  14  14  Temp:  98.6 F (37 C)  97.9 F (36.6 C)  TempSrc:  Oral  Oral  SpO2: 95% 94%  94%  Weight:      Height:        Intake/Output Summary (Last 24 hours) at 03/12/2018 0949 Last data filed at 03/11/2018 2304 Gross per 24 hour  Intake 0 ml  Output 1201 ml  Net -1201 ml    LABS: Basic Metabolic Panel: Recent Labs    03/10/18 0409  NA 139  K 3.6  CL 108  CO2 27  GLUCOSE 94  BUN 6  CREATININE 0.54*  CALCIUM 8.0*  MG 1.7  PHOS 2.8   Liver Function Tests: Recent Labs    03/10/18 0409  AST 18  ALT 7  ALKPHOS 41  BILITOT 0.8  PROT 5.7*  ALBUMIN 2.8*   No results for input(s): LIPASE, AMYLASE in the last 72 hours. CBC: Recent Labs    03/10/18 0409  WBC 10.9*  NEUTROABS 8.2*  HGB 9.6*  HCT 30.2*  MCV 88.8  PLT 190   Cardiac Enzymes: Recent Labs    03/10/18 2224  TROPONINI <0.03   BNP: Invalid input(s): POCBNP D-Dimer: No results for input(s): DDIMER in the last 72 hours. Hemoglobin A1C: No results for input(s): HGBA1C in the last 72 hours. Fasting Lipid Panel: No results for input(s): CHOL, HDL, LDLCALC, TRIG, CHOLHDL, LDLDIRECT in the last 72 hours. Thyroid Function Tests: No results for input(s): TSH, T4TOTAL, T3FREE, THYROIDAB in the last 72 hours.  Invalid input(s): FREET3 Anemia Panel: No results for input(s): VITAMINB12, FOLATE, FERRITIN, TIBC, IRON, RETICCTPCT in the last 72 hours.   PHYSICAL EXAM General: Well developed, well nourished, in no acute distress HEENT:  Normocephalic and atramatic Neck:  No JVD.  Lungs: Clear bilaterally to auscultation and percussion. Heart: HRRR . Normal S1 and S2 without gallops or murmurs.  Abdomen: Bowel sounds are positive, abdomen soft and non-tender  Msk:  Back normal, normal gait. Normal strength and tone  for age. Extremities: No clubbing, cyanosis or edema.   Neuro: Alert and oriented X 3. Psych:  Good affect, responds appropriately  TELEMETRY: Sinus rhythm  ASSESSMENT AND PLAN: Sinus tachycardia with occasional SVT on digoxin right now 0.25 and will add metoprolol succinate 25 mg once a day.  Ejection fraction on echo was normal.  Principal Problem:   Altered mental status, unspecified Active Problems:   CVA (cerebral vascular accident) ([place])    [name] David, MD, [facility] 03/12/2018 9:49 AM

## 2018-03-12 NOTE — Progress Notes (Addendum)
Subjective: Patient awake, alert and oriented x4.  Per nursing report, patient was able to sleep last night with less agitation/restlessness noted.   Objective: Current vital signs: BP 111/83 (BP Location: Right Arm)   Pulse (!) 118   Temp 98.4 F (36.9 C) (Oral)   Resp 18   Ht 6' (1.829 m)   Wt 56.7 kg   SpO2 95%   BMI 16.95 kg/m  Vital signs in last 24 hours: Temp:  [97.9 F (36.6 C)-98.6 F (37 C)] 98.4 F (36.9 C) (02/18 1005) Pulse Rate:  [92-118] 118 (02/18 1005) Resp:  [14-18] 18 (02/18 1005) BP: (92-132)/(63-92) 111/83 (02/18 1005) SpO2:  [92 %-95 %] 95 % (02/18 1005)  Intake/Output from previous day: 02/17 0701 - 02/18 0700 In: 0  Out: 1201 [Urine:1200; Stool:1] Intake/Output this shift: Total I/O In: 240 [P.O.:240] Out: -  Nutritional status:  Diet Order            Diet regular Room service appropriate? Yes; Fluid consistency: Thin  Diet effective now             Neurological Exam  Mental Status: Alert, orientedto person but not to place or time, thought contentmildly inappropriate.Speech fluent without evidence of aphasia. Able to followsimplecommandsbut with somedifficulty. Requires prompting and cueing.Attention span and concentration seemed mildly impaired. Cranial Nerves: II: Discs flat bilaterally; Visual fields grossly normal, pupils equal, round, reactive to light and accommodation III,IV, VI: ptosis not present, extra-ocular motions intact bilaterally V,VII: smile symmetric, facial light touch sensationintact VIII: hearing normal bilaterally IX,X: gag reflex present XI: bilateral shoulder shrug XII: midline tongue extension Motor: Right :Upper extremity 5/5Without pronator driftLeft: Upper extremity 5/5 without pronator drift Right:Lower extremity 5/5Left: Lower extremity 5/5 Tone and bulk:normal tone throughout; no atrophy noted Sensory: Pinprick and light  touchintact bilaterally Deep Tendon Reflexes: 2+ and symmetric throughout Plantars: Right:muteLeft: mute Cerebellar: Finger-to-nosetesting intact bilaterally.Heel to shin testing normal bilaterally Gait: not tested due to safety concerns  Data Reviewed  Lab Results: Basic Metabolic Panel: Recent Labs  Lab 03/07/18 1620 03/10/18 0409  NA 137 139  K 3.8 3.6  CL 100 108  CO2 30 27  GLUCOSE 95 94  BUN 13 6  CREATININE 0.89 0.54*  CALCIUM 9.0 8.0*  MG  --  1.7  PHOS  --  2.8    Liver Function Tests: Recent Labs  Lab 03/07/18 1620 03/10/18 0409  AST 14* 18  ALT 7 7  ALKPHOS 44 41  BILITOT 0.8 0.8  PROT 6.9 5.7*  ALBUMIN 3.6 2.8*   No results for input(s): LIPASE, AMYLASE in the last 168 hours. Recent Labs  Lab 03/08/18 1454  AMMONIA 15    CBC: Recent Labs  Lab 03/07/18 1620 03/10/18 0409  WBC 12.8* 10.9*  NEUTROABS 10.8* 8.2*  HGB 10.8* 9.6*  HCT 34.8* 30.2*  MCV 89.9 88.8  PLT 209 190    Cardiac Enzymes: Recent Labs  Lab 03/10/18 2224  TROPONINI <0.03    Lipid Panel: Recent Labs  Lab 03/09/18 0407  CHOL 84  TRIG 47  HDL 31*  CHOLHDL 2.7  VLDL 9  LDLCALC 44    CBG: No results for input(s): GLUCAP in the last 168 hours.  Microbiology: Results for orders placed or performed during the hospital encounter of 02/15/18  Gastrointestinal Panel by PCR , Stool     Status: Abnormal   Collection Time: 02/15/18 11:52 AM  Result Value Ref Range Status   Campylobacter species NOT DETECTED NOT DETECTED Final  Plesimonas shigelloides NOT DETECTED NOT DETECTED Final   Salmonella species NOT DETECTED NOT DETECTED Final   Yersinia enterocolitica NOT DETECTED NOT DETECTED Final   Vibrio species NOT DETECTED NOT DETECTED Final   Vibrio cholerae NOT DETECTED NOT DETECTED Final   Enteroaggregative E coli (EAEC) NOT DETECTED NOT DETECTED Final   Enteropathogenic E coli (EPEC) NOT DETECTED NOT DETECTED Final    Enterotoxigenic E coli (ETEC) NOT DETECTED NOT DETECTED Final   Shiga like toxin producing E coli (STEC) NOT DETECTED NOT DETECTED Final   Shigella/Enteroinvasive E coli (EIEC) NOT DETECTED NOT DETECTED Final   Cryptosporidium NOT DETECTED NOT DETECTED Final   Cyclospora cayetanensis NOT DETECTED NOT DETECTED Final   Entamoeba histolytica NOT DETECTED NOT DETECTED Final   Giardia lamblia NOT DETECTED NOT DETECTED Final   Adenovirus F40/41 NOT DETECTED NOT DETECTED Final   Astrovirus NOT DETECTED NOT DETECTED Final   Norovirus GI/GII NOT DETECTED NOT DETECTED Final   Rotavirus A NOT DETECTED NOT DETECTED Final   Sapovirus (I, II, IV, and V) DETECTED (A) NOT DETECTED Final    Comment: Performed at [facility], [address] [place], [place] [address]  C difficile quick scan w PCR reflex     Status: None   Collection Time: 02/15/18 11:52 AM  Result Value Ref Range Status   C Diff antigen NEGATIVE NEGATIVE Final   C Diff toxin NEGATIVE NEGATIVE Final   C Diff interpretation No C. difficile detected.  Final    Comment: Performed at [facility], [address] [place], [place] [address]  Blood culture (routine x 2)     Status: None   Collection Time: 02/15/18  2:52 PM  Result Value Ref Range Status   Specimen Description BLOOD BLOOD RIGHT FOREARM  Final   Special Requests   Final    BOTTLES DRAWN AEROBIC AND ANAEROBIC Blood Culture results may not be optimal due to an excessive volume of blood received in culture bottles   Culture   Final    NO GROWTH 5 DAYS Performed at [facility], [address], [place], [place] [address]    Report Status 02/20/2018 FINAL  Final  Blood culture (routine x 2)     Status: None   Collection Time: 02/15/18  2:54 PM  Result Value Ref Range Status   Specimen Description BLOOD BLOOD RIGHT WRIST  Final   Special Requests   Final    BOTTLES DRAWN AEROBIC AND ANAEROBIC Blood Culture adequate volume   Culture   Final    NO  GROWTH 5 DAYS Performed at [facility], [address] [place], [place] [address]    Report Status 02/20/2018 FINAL  Final    Coagulation Studies: No results for input(s): LABPROT, INR in the last 72 hours.  Imaging: No results found.  Medications:  I have reviewed the patient's current medications. Prior to Admission:  Medications Prior to Admission  Medication Sig Dispense Refill Last Dose  . acetaminophen (TYLENOL) 325 MG tablet Take 2 tablets (650 mg total) by mouth every 6 (six) hours as needed for mild pain (or Fever >/= 101).   prn at prn  . albuterol (PROVENTIL HFA;VENTOLIN HFA) 108 (90 Base) MCG/ACT inhaler Inhale 2 puffs into the lungs every 6 (six) hours as needed for wheezing or shortness of breath. 1 Inhaler 6 prn at prn  . ALPRAZolam (XANAX) 0.5 MG tablet Take 1 tablet (0.5 mg total) by mouth 3 (three) times daily as needed for  anxiety. 90 tablet 1 03/07/2018 at 1400  . aspirin 325 MG EC tablet Take 325 mg by mouth daily.   03/07/2018 at 0930  . atorvastatin (LIPITOR) 20 MG tablet Take 20 mg by mouth daily.  2 03/07/2018 at 0930  . dexamethasone (DECADRON) 4 MG tablet TAKE 3 TABLETS (12 MG TOTAL) BY MOUTH ONCE A WEEK. TAKE WITH BREAKFAST 12 tablet 4 Past Week at Unknown time  . DULoxetine (CYMBALTA) 30 MG capsule TAKE ONE CAPSULE BY MOUTH EVERY DAY 90 capsule 1 03/07/2018 at 0930  . fentaNYL (DURAGESIC) 100 MCG/HR Place 1 patch onto the skin every other day. Use with a 25 mcg patch for total dose of 125 mcg 15 patch 0 03/06/2018 at 0930  . fentaNYL (DURAGESIC) 25 MCG/HR Place 1 patch onto the skin every other day. Use with a 100 mcg patch for total dose of 125 mcg 15 patch 0 03/06/2018 at 0930  . Fluticasone-Salmeterol (ADVAIR DISKUS) 250-50 MCG/DOSE AEPB Inhale 1 puff into the lungs 2 (two) times daily. 3 each 2 03/07/2018 at 0930  . gabapentin (NEURONTIN) 100 MG capsule Take 2 capsules (200 mg total) by mouth 3 (three) times daily. 180 capsule 3 03/07/2018 at 1400  .  HYDROmorphone (DILAUDID) 2 MG tablet Take 1 tablet (2 mg total) by mouth every 6 (six) hours as needed for severe pain. 56 tablet 0 03/07/2018 at 1400  . ipratropium-albuterol (DUONEB) 0.5-2.5 (3) MG/3ML SOLN Take 3 mLs by nebulization every 4 (four) hours as needed. 360 mL 3 prn at prn  . megestrol (MEGACE) 20 MG tablet TAKE ONE TABLET BY MOUTH ONCE DAILY 30 tablet 2 03/07/2018 at 0930  . metoprolol tartrate (LOPRESSOR) 50 MG tablet Take 50 mg by mouth 2 (two) times daily.  2 03/07/2018 at 0930  . naloxone [facility]) nasal spray 4 mg/0.1 mL For Opioid Overdose: Call 911 and administer a single spray of Narcan in one nostril. Repeat every 4mns as needed if no or minimal response 1 kit 2   . NINLARO 4 MG capsule TAKE 1 CAPSULE (4 MG TOTAL) BY MOUTH ONCE A WEEK. TAKE FOR 3 WEEKS ON, THEN 1 WEEK OFF. TAKE ON EMPTY STOMACH 1HR BEFORE OR 2HRS AFTER FOOD. 3 capsule 4 Past Month at Unknown time  . ondansetron (ZOFRAN) 8 MG tablet 1 pill every 8 hours as needed. 40 tablet 0 prn at prn  . tamsulosin (FLOMAX) 0.4 MG CAPS capsule Take 1 capsule (0.4 mg total) by mouth daily. 30 capsule 0 03/07/2018 at 0930  . tiotropium (SPIRIVA HANDIHALER) 18 MCG inhalation capsule Place 1 capsule (18 mcg total) into inhaler and inhale daily. 30 capsule 6 03/07/2018 at 0930   Scheduled: . aspirin  81 mg Oral Daily  . atorvastatin  40 mg Oral q1800  . clopidogrel  75 mg Oral Daily  . digoxin  0.25 mg Oral Daily  . diphenhydrAMINE  50 mg Oral QHS  . DULoxetine  30 mg Oral Daily  . enoxaparin (LOVENOX) injection  40 mg Subcutaneous Q24H  . feeding supplement (ENSURE ENLIVE)  237 mL Oral TID BM  . fentaNYL  1 patch Transdermal Q48H  . fentaNYL  1 patch Transdermal Q48H  . fluticasone furoate-vilanterol  1 puff Inhalation Daily  . haloperidol  5 mg Oral QHS  . megestrol  320 mg Oral BID  . metoprolol tartrate  50 mg Oral BID  . multivitamin with minerals  1 tablet Oral Daily  . nicotine  14 mg Transdermal Daily  . tamsulosin  0.4 mg Oral Daily  . tiotropium  18 mcg Inhalation Daily   Patient seen and examined.  Clinical course and management discussed.  Necessary edits performed.  I agree with the above.  Assessment and plan of care developed and discussed below.    Assessment:57 y.o.maleith past medical history of multiple myeloma, myocardial infarction, COPD on chronic oxygen, CHF, polysubstance abuse, hyperlipidemia, CAD, defibrillator placement, and hyperthyroidism presenting to the ED on 03/07/2018 under IVC for altered mental status associated with behavioral disturbances. A non-contrast head CT showed decreased attenuation in the posterior mid right frontal lobe questionable infarct in this area which is likely contributing to change in mental status. However cannot rule out additional contributing medical issues given hx of multiple myeloma. Unable to obtain MRI brain due to presence of defibrillator.  Echocardiogram did not show cardiac source of emboli.  workup  including rapid HIV, RPR, and Lyme reflex, thiamine, ammonia, and TSH are ultimately negative. Patient was on Aspirin 81 mg prior to this event. Mental status continues to improve. No further work up from neurological stand point indicated at this time.  Plan: 1. Continue dual therapy Aspirin 81 mg/day and Plavix 75 mg /day x 30 days then stop Aspirin and continue with Plavix 75 mg/day.  Continue statin.   2. B12 replacement 3. PT consult, OT consult, Speech consult 4. Avoid benzodiazepine due to paradoxical worsening of behavioral disturbances 5. Telemetry monitoring 6. Frequent neuro checks  This patient was staffed with Dr. Magda Paganini, Doy Mince who personally evaluated patient, reviewed documentation and agreed with assessment and plan of care as above.  Rufina Falco, DNP, FNP-BC Board certified Nurse Practitioner Neurology Department   LOS: 4 days   03/12/2018  1:44 PM  No further neurologic intervention is recommended at this time.   If further questions arise, please call or page at that time.  Thank you for allowing neurology to participate in the care of this patient.  Alexis Goodell, MD Neurology [phone]  03/12/2018  2:29 PM
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
## 2018-03-12 NOTE — Plan of Care (Signed)
  Problem: Health Behavior/Discharge Planning: Goal: Ability to manage health-related needs will improve Outcome: Progressing Note:  Foley removed earlier in shift. Sitter order also d/c'd. Patient may be d/c'd later in shift. Will continue to monitor. Wenda Low [facility]

## 2018-03-13 DIAGNOSIS — I6389 Other cerebral infarction: Secondary | ICD-10-CM

## 2018-03-13 DIAGNOSIS — R451 Restlessness and agitation: Secondary | ICD-10-CM

## 2018-03-13 LAB — CBC
HCT: 38.4 % — ABNORMAL LOW (ref 39.0–52.0)
Hemoglobin: 12.1 g/dL — ABNORMAL LOW (ref 13.0–17.0)
MCH: 27.6 pg (ref 26.0–34.0)
MCHC: 31.5 g/dL (ref 30.0–36.0)
MCV: 87.5 fL (ref 80.0–100.0)
Platelets: 425 10*3/uL — ABNORMAL HIGH (ref 150–400)
RBC: 4.39 MIL/uL (ref 4.22–5.81)
RDW: 15 % (ref 11.5–15.5)
WBC: 8.4 10*3/uL (ref 4.0–10.5)
nRBC: 0 % (ref 0.0–0.2)

## 2018-03-13 LAB — DIGOXIN LEVEL: Digoxin Level: 0.4 ng/mL — ABNORMAL LOW (ref 0.8–2.0)

## 2018-03-13 MED ORDER — CYANOCOBALAMIN 1000 MCG PO TABS: 1000.0000 ug | ORAL_TABLET | Freq: Every day | ORAL | 0 refills | Status: DC

## 2018-03-13 MED ORDER — METOPROLOL TARTRATE 25 MG PO TABS: 25.0000 mg | ORAL_TABLET | Freq: Two times a day (BID) | ORAL | 0 refills | Status: DC

## 2018-03-13 MED ORDER — VITAMIN B-12 1000 MCG PO TABS
1000.0000 ug | ORAL_TABLET | Freq: Every day | ORAL | Status: DC
  Administered 2018-03-13: 1000 ug via ORAL
  Filled 2018-03-13: qty 1

## 2018-03-13 MED ORDER — METOPROLOL TARTRATE 25 MG PO TABS: 25.0000 mg | ORAL_TABLET | Freq: Two times a day (BID) | ORAL | Status: DC

## 2018-03-13 NOTE — Progress Notes (Signed)
Patient given discharge instructions with fiance at bedside. Precriptions given to patient. IV taken out and tele monitor off. Patient and fiance verbalized understanding with no further questions or concerns. Patient's fiance brought oxygen tank. NO other issues patient going home with fiance.

## 2018-03-13 NOTE — Consult Note (Addendum)
[name] Psychiatry Consult   Reason for Consult: altered mental status and agitation Referring Physician: Siadecki Patient Identification: [name] MRN:  [id number] Principal Diagnosis: Altered mental status, unspecified Diagnosis:  Principal Problem:   Altered mental status, unspecified Active Problems:   CVA (cerebral vascular accident) [name])   Palliative care encounter  Patient is seen, chart is reviewed. Total Time spent with patient: 45 minutes  Subjective: "I am anxious about going to live with my girlfriend, but my family can't take me."  HPI: [name] is a 58 y.o. male patient admitted with a known history of CAD, multiple myeloma chronic back pain, COPD, chronic respiratory failure on 2 L oxygen, diastolic CHF, atrial septal defect, tobacco use presents to [name] brought in by his girlfriend due to hallucination and agitation at home.  IVC was placed.  Initially thought to be psychosis.  CT scan of the head shows right frontal CVA.  This is new from CT scan of the head in December.  Patient is confused unable to provide history.  Old records reviewed.  Discussed with ED staff.  No focal weakness found.  Being admitted for stroke.  Initial CT scan showed signs suggestive of stroke, however reread revealed artifact, and repeat CT scan did not show stroke.  Psychiatry is reconsulted to evaluate patient for agitation, after he was disoriented and pulling at IV lines 2/16 and 2/17.  Patient required IM Haldol with good relief.  03/13/2018 psychiatry consult requested for capacity for medical decision-making.  03/11/2018 collateral from sister: Expresses concern of patient's substance use on top of his multiple myeloma.  She expresses concern that patient's girlfriend is also a substance abuser and has been providing [name] with drugs.  She states that there are children that live in the home that have been exposed to violence as well as drug use, she states that she  has called child protective services to start a report.  Of note, Adult Protective Services has also been contacted due to concerns of patient significant others lack of adequate care of [name]. Home medications are reviewed with sister.  03/13/2018.  Patient no longer requires sitter at bedside. Patient is awake and oriented. He denies SI, HI, AVH. He is smiling and has appropriate conversation. He has some anxiety about returning home, but hopeful that things will go well with support services at home.  Patient reports that he is disappointed that his family that seem to be so concerned about him no longer wants to assist with his discharge and allowing them to come to his house.  He recognizes the risks of substance abuse triggers living with his girlfriend who is a known substance abuser.  He does note though that there are children in the home, who adore him.  He is looking forward to spending time with them.  While this writer is present, patient states that he recognizes it would be a safer environment to live with his sister, and he attempts to call her.  Discussion with social work reveals that patient's sister's home does not have enough for him to care for patient.  Patient expresses understanding, and is hopeful that he will be able to avoid drug use, and enjoy quality time with his significant other and her children in his home.  Patient is aware of decision to be made.  He is aware of risks and benefits of possible options.  He is able to make a clear choice, and has capacity for medical decision making.  Past Psychiatric History: No known past psychiatric history.  Patient denies ever seeing a mental health provider in the past.  Denies any suicidality.  Nothing in the chart to suggest any significant mental health problems other than anxiety related to pain and illness  Risk to Self: Suicidal Ideation: No Suicidal Intent: No Is patient at risk for suicide?: No Suicidal Plan?: No Access to  Means: No What has been your use of drugs/alcohol within the last 12 months?: denies(denies) How many times?: 0 Other Self Harm Risks: denies Triggers for Past Attempts: None known Intentional Self Injurious Behavior: None Risk to Others: Homicidal Ideation: No Thoughts of Harm to Others: No Current Homicidal Intent: No Current Homicidal Plan: No Access to Homicidal Means: No History of harm to others?: No Assessment of Violence: None Noted Does patient have access to weapons?: No Criminal Charges Pending?: No Does patient have a court date: No Prior Inpatient Therapy: Prior Inpatient Therapy: No Prior Therapy Dates: 1980 Prior Therapy Facilty/Provider(s): [name] Reason for Treatment: Substance Use Treatment Prior Outpatient Therapy: Prior Outpatient Therapy: No Does patient have an [name] team?: No Does patient have Intensive In-House Services?  : No Does patient have [name] services? : No Does patient have [name] services?: No  Past Medical History:  Past Medical History:  Diagnosis Date  . Anxiety   . Arthritis   . Atrial septal defect   . CHF (congestive heart failure) ([name])   . COPD (chronic obstructive pulmonary disease) ([name])   . Coronary artery disease   . Dysrhythmias   . GERD (gastroesophageal reflux disease)   . Hyperlipidemia   . Multiple myeloma ([name])   . Multiple myeloma ([name])   . Myocardial infarction ([name])   . Substance abuse [name])     Past Surgical History:  Procedure Laterality Date  . CARDIAC DEFIBRILLATOR PLACEMENT     Medical history: Patient has multiple myeloma.  Significant bone lesions.  Significant pain.  Also COPD.  Family History:  Family History  Problem Relation Age of Onset  . COPD Mother   . CAD Mother   . Heart attack Father   . Prostate cancer Maternal Grandfather   . Kidney cancer Neg Hx   . Bladder Cancer Neg Hx    Family Psychiatric  History: Nothing known  Social History:  Social History   Substance and Sexual  Activity  Alcohol Use No  . Alcohol/week: 0.0 standard drinks     Social History   Substance and Sexual Activity  Drug Use No    Social History   Socioeconomic History  . Marital status: Divorced    Spouse name: Not on file  . Number of children: Not on file  . Years of education: Not on file  . Highest education level: Not on file  Occupational History  . Not on file  Social Needs  . Financial resource strain: Not on file  . Food insecurity:    Worry: Not on file    Inability: Not on file  . Transportation needs:    Medical: Not on file    Non-medical: Not on file  Tobacco Use  . Smoking status: Current Every Day Smoker    Packs/day: 0.50    Years: 40.00    Pack years: 20.00    Types: Cigarettes  . Smokeless tobacco: Never Used  Substance and Sexual Activity  . Alcohol use: No    Alcohol/week: 0.0 standard drinks  . Drug use: No  . Sexual activity: Not on file  Lifestyle  . Physical activity:    Days per week: Not on file    Minutes per session: Not on file  . Stress: Not on file  Relationships  . Social connections:    Talks on phone: Not on file    Gets together: Not on file    Attends religious service: Not on file    Active member of club or organization: Not on file    Attends meetings of clubs or organizations: Not on file    Relationship status: Not on file  Other Topics Concern  . Not on file  Social History Narrative  . Not on file   Additional Social History:   Social history: Lives at home with his girlfriend.  Substance abuse history: Nothing described in the chart.  Allergies:  No Known Allergies  Labs:  Results for orders placed or performed during [name] encounter of 03/07/18 (from the past 48 hour(s))  CBC     Status: Abnormal   Collection Time: 03/13/18  5:45 AM  Result Value Ref Range   WBC 8.4 4.0 - 10.5 K/uL   RBC 4.39 4.22 - 5.81 MIL/uL   Hemoglobin 12.1 (L) 13.0 - 17.0 g/dL   HCT 38.4 (L) 39.0 - 52.0 %   MCV 87.5  80.0 - 100.0 fL   MCH 27.6 26.0 - 34.0 pg   MCHC 31.5 30.0 - 36.0 g/dL   RDW 15.0 11.5 - 15.5 %   Platelets 425 (H) 150 - 400 K/uL   nRBC 0.0 0.0 - 0.2 %    Comment: Performed at [name], [address] [place], [place] [address]  Digoxin level     Status: Abnormal   Collection Time: 03/13/18  5:45 AM  Result Value Ref Range   Digoxin Level 0.4 (L) 0.8 - 2.0 ng/mL    Comment: Performed at [name], [address] [place], [place] [address]    Current Facility-Administered Medications  Medication Dose Route Frequency Provider Last Rate Last Dose  . acetaminophen (TYLENOL) tablet 650 mg  650 mg Oral Q4H PRN Hillary Bow, MD       Or  . acetaminophen (TYLENOL) solution 650 mg  650 mg Per Tube Q4H PRN Sudini, Alveta Heimlich, MD       Or  . acetaminophen (TYLENOL) suppository 650 mg  650 mg Rectal Q4H PRN Sudini, Alveta Heimlich, MD      . acetaminophen (TYLENOL) tablet 650 mg  650 mg Oral Q6H PRN Merlyn Lot, MD   650 mg at 03/13/18 0908  . albuterol (PROVENTIL) (2.5 MG/3ML) 0.083% nebulizer solution 2.5 mg  2.5 mg Inhalation Q6H PRN Merlyn Lot, MD      . ALPRAZolam Duanne Moron) tablet 0.5 mg  0.5 mg Oral TID PRN Merlyn Lot, MD   0.5 mg at 03/13/18 1207  . aspirin EC tablet 81 mg  81 mg Oral Daily Lang Snow, NP   81 mg at 03/13/18 0908  . atorvastatin (LIPITOR) tablet 40 mg  40 mg Oral q1800 Hillary Bow, MD   40 mg at 03/12/18 1716  . clopidogrel (PLAVIX) tablet 75 mg  75 mg Oral Daily Lang Snow, NP   75 mg at 03/13/18 1829  . digoxin (LANOXIN) tablet 0.25 mg  0.25 mg Oral Daily Neoma Laming A, MD   0.25 mg at 03/13/18 0910  . diphenhydrAMINE (BENADRYL) capsule 50 mg  50 mg Oral QHS Lavella Hammock, MD   50 mg at 03/12/18 2300  . DULoxetine (  CYMBALTA) DR capsule 30 mg  30 mg Oral Daily Hillary Bow, MD   30 mg at 03/13/18 0908  . enoxaparin (LOVENOX) injection 40 mg  40 mg Subcutaneous Q24H Hillary Bow, MD   40 mg at  03/13/18 1306  . feeding supplement (ENSURE ENLIVE) (ENSURE ENLIVE) liquid 237 mL  237 mL Oral TID BM Sudini, Srikar, MD   237 mL at 03/13/18 1306  . fentaNYL (DURAGESIC) 100 MCG/HR 1 patch  1 patch Transdermal Q48H Merlyn Lot, MD   1 patch at 03/13/18 1636  . fentaNYL (DURAGESIC) 25 MCG/HR 1 patch  1 patch Transdermal Q48H Merlyn Lot, MD   1 patch at 03/13/18 1624  . fluticasone furoate-vilanterol (BREO ELLIPTA) 200-25 MCG/INH 1 puff  1 puff Inhalation Daily Merlyn Lot, MD   1 puff at 03/13/18 0911  . haloperidol (HALDOL) tablet 1 mg  1 mg Oral Q6H PRN Lavella Hammock, MD      . haloperidol (HALDOL) tablet 5 mg  5 mg Oral QHS Lavella Hammock, MD   5 mg at 03/12/18 2259  . haloperidol lactate (HALDOL) injection 5 mg  5 mg Intramuscular Q6H PRN Loney Hering D, MD   5 mg at 03/11/18 0206  . ipratropium-albuterol (DUONEB) 0.5-2.5 (3) MG/3ML nebulizer solution 3 mL  3 mL Nebulization Q4H PRN Merlyn Lot, MD      . megestrol (MEGACE) 400 MG/10ML suspension 320 mg  320 mg Oral BID Hillary Bow, MD   320 mg at 03/13/18 0909  . metoprolol tartrate (LOPRESSOR) tablet 25 mg  25 mg Oral BID Vaughan Basta, MD      . multivitamin with minerals tablet 1 tablet  1 tablet Oral Daily Hillary Bow, MD   1 tablet at 03/13/18 0909  . nicotine (NICODERM CQ - dosed in mg/24 hours) patch 14 mg  14 mg Transdermal Daily Hillary Bow, MD   14 mg at 03/13/18 1623  . senna-docusate (Senokot-S) tablet 1 tablet  1 tablet Oral QHS PRN Sudini, Alveta Heimlich, MD      . tamsulosin (FLOMAX) capsule 0.4 mg  0.4 mg Oral Daily Hillary Bow, MD   0.4 mg at 03/13/18 0909  . tiotropium (SPIRIVA) inhalation capsule ([facility] use ONLY) 18 mcg  18 mcg Inhalation Daily Hillary Bow, MD   18 mcg at 03/13/18 0911  . vitamin B-12 (CYANOCOBALAMIN) tablet 1,000 mcg  1,000 mcg Oral Daily Vaughan Basta, MD   1,000 mcg at 03/13/18 1207    Musculoskeletal: Strength & Muscle Tone: decreased Gait &  Station: normal Patient leans: N/A  Psychiatric Specialty Exam: Physical Exam  Nursing note and vitals reviewed. Constitutional: He is oriented to person, place, and time. He appears well-developed. No distress.  HENT:  Head: Normocephalic and atraumatic.  Eyes: EOM are normal.  Neck: Normal range of motion.  Cardiovascular: Normal rate.  Respiratory: Effort normal. No respiratory distress. He has no wheezes.  GI: Soft.  Musculoskeletal: Normal range of motion.  Neurological: He is alert and oriented to person, place, and time.  Skin: Skin is warm and dry.  Psychiatric: He has a normal mood and affect. His speech is normal and behavior is normal. Thought content is not paranoid and not delusional. He expresses no homicidal and no suicidal ideation.    Review of Systems  Constitutional: Positive for malaise/fatigue and weight loss.  HENT: Negative.   Eyes: Negative.   Respiratory: Negative.   Cardiovascular: Negative.   Gastrointestinal: Negative.   Musculoskeletal: Negative.   Skin: Negative.  Neurological: Negative.   Psychiatric/Behavioral: Negative for depression, hallucinations, memory loss, substance abuse and suicidal ideas. The patient is not nervous/anxious and does not have insomnia.     Blood pressure 102/70, pulse 81, temperature 98.3 F (36.8 C), temperature source Oral, resp. rate 18, height 6' (1.829 m), weight 55.1 kg, SpO2 94 %.Body mass index is 16.48 kg/m.  General Appearance: Fairly Groomed  Eye Contact:  Good  Speech:  Clear and Coherent  Volume:  Normal  Mood:  Euthymic  Affect:  Congruent  Thought Process:  Descriptions of Associations: Intact  Orientation:  Full (Time, Place, and Person)  Thought Content:  Logical and Hallucinations: None  Suicidal Thoughts:  No  Homicidal Thoughts:  No  Memory:  fair  Judgement:  Impaired  Insight:  Shallow  Psychomotor Activity:  Normal  Concentration:  Concentration: Good  Recall:  [place] of Knowledge:   Fair  Language:  Good  Akathisia:  No  Handed:  Right  AIMS (if indicated):     Assets:  Desire for Improvement Housing Social Support  ADL's:  Impaired  Cognition:  Impaired,  Mild and Moderate  Sleep:        Treatment Plan Summary: 57 year old man with multiple myeloma presented to [name] with abrupt mental status changes.  No known past psychiatric history.  Under the circumstances most likely this would represent a medical or medication or drug situation with contribution from frontal lobe stroke.  I have reviewed medication recommendations with neurology for patient to continue at home: 5 mg Haldol at bedtime with Benadryl 50 mg at bedtime.  Haldol 1 mg twice daily during the day as needed for agitation episodes. Involuntary commitment has been rescinded on 03/08/2018.  Patient has capacity for decision making. Patient expresses understanding, and is hopeful that he will be able to avoid drug use, and enjoy quality time with his significant other and her children in his home.  Patient is aware of decision to be made.  He is aware of risks and benefits of possible options.  He is able to make a clear choice, and has capacity for medical decision making.  Medications reviewed and the following changes are recommended: Continue Haldol 5 mg at bedtime, to prevent nighttime delirium and agitated behaviors.  Give with Benadryl 50 mg to prevent dystonia. Haldol 1 mg every 6 hours as needed for agitation and violent outbursts.  Disposition: No evidence of imminent risk to self or others at present.   Patient does not meet criteria for psychiatric inpatient admission.  He was able to engage in safety planning including plan to return emergency department or contact emergency services if he feels unable to maintain his own safety or the safety of others. Patient had no further questions, comments, or concerns. Discharge into care of girlfriend via ambulance, who agrees to maintain  patient safety.    Lavella Hammock, MD 03/13/2018 4:55 PM

## 2018-03-13 NOTE — Progress Notes (Signed)
Per CCMD patient had 12 beats of wide QRS vtach. Patient laying in the room with no complaints. Asymptomatic, Dr. Anselm Jungling notified. No new orders at this time.

## 2018-03-13 NOTE — Progress Notes (Signed)
SUBJECTIVE:Feeling well today. No chest pain or shortness of breath.   Vitals:   03/12/18 2000 03/12/18 2252 03/13/18 0446 03/13/18 0744  BP: (!) 89/61 110/88 (!) 87/58 96/72  Pulse: 97 99 87 84  Resp: 19  18 16   Temp: 98.6 F (37 C)  98.4 F (36.9 C) 98.6 F (37 C)  TempSrc: Oral  Oral Oral  SpO2: 94%  96% 100%  Weight:   55.1 kg   Height:        Intake/Output Summary (Last 24 hours) at 03/13/2018 0854 Last data filed at 03/13/2018 0545 Gross per 24 hour  Intake 240 ml  Output 700 ml  Net -460 ml    LABS: Basic Metabolic Panel: No results for input(s): NA, K, CL, CO2, GLUCOSE, BUN, CREATININE, CALCIUM, MG, PHOS in the last 72 hours. Liver Function Tests: No results for input(s): AST, ALT, ALKPHOS, BILITOT, PROT, ALBUMIN in the last 72 hours. No results for input(s): LIPASE, AMYLASE in the last 72 hours. CBC: Recent Labs    03/13/18 0545  WBC 8.4  HGB 12.1*  HCT 38.4*  MCV 87.5  PLT 425*   Cardiac Enzymes: Recent Labs    03/10/18 2224  TROPONINI <0.03   BNP: Invalid input(s): POCBNP D-Dimer: No results for input(s): DDIMER in the last 72 hours. Hemoglobin A1C: No results for input(s): HGBA1C in the last 72 hours. Fasting Lipid Panel: No results for input(s): CHOL, HDL, LDLCALC, TRIG, CHOLHDL, LDLDIRECT in the last 72 hours. Thyroid Function Tests: No results for input(s): TSH, T4TOTAL, T3FREE, THYROIDAB in the last 72 hours.  Invalid input(s): FREET3 Anemia Panel: No results for input(s): VITAMINB12, FOLATE, FERRITIN, TIBC, IRON, RETICCTPCT in the last 72 hours.   PHYSICAL EXAM General: Cachetic, pale, no apparent distress HEENT:  Normocephalic and atramatic Neck:  No JVD.  Lungs: Clear bilaterally to auscultation and percussion. Heart: HRRR . Normal S1 and S2 without gallops or murmurs.  Abdomen: Bowel sounds are positive, abdomen soft and non-tender  Msk:  Back normal, normal gait. Normal strength and tone for age. Extremities: No clubbing,  cyanosis or edema.   Neuro: Alert and oriented X 3. Psych:  Good affect, responds appropriately  TELEMETRY: NSR 85bpm  ASSESSMENT AND PLAN:  Intermittent sinus tachycardia/SVT: Heart rate well controlled with digoxin 0.25mg /day and metoprolol 50mg  BID. Blood pressure remains borderline low. Will continue to monitor and consider weaning to metoprolol 25mg  BID if consistently low.    Principal Problem:   Altered mental status, unspecified Active Problems:   CVA (cerebral vascular accident) [facility])   Palliative care encounter    [name] Bathe, NP-C 03/13/2018 8:54 AM Cell: [phone]

## 2018-03-13 NOTE — Progress Notes (Signed)
   03/13/18 1800  Clinical Encounter Type  Visited With Patient  Visit Type Follow-up  Referral From Nurse  Ch received a page requesting an AD education for the pt. Pt noted that he plans to have one done with his fiance once he gets discharged.

## 2018-03-13 NOTE — Plan of Care (Signed)
  Problem: Education: Goal: Knowledge of General Education information will improve Description Including pain rating scale, medication(s)/side effects and non-pharmacologic comfort measures Outcome: Adequate for Discharge   Problem: Education: Goal: Knowledge of disease or condition will improve Outcome: Adequate for Discharge Goal: Knowledge of secondary prevention will improve Outcome: Adequate for Discharge Goal: Knowledge of patient specific risk factors addressed and post discharge goals established will improve Outcome: Adequate for Discharge   Problem: Coping: Goal: Will identify appropriate support needs Outcome: Adequate for Discharge   Problem: Health Behavior/Discharge Planning: Goal: Ability to manage health-related needs will improve Outcome: Adequate for Discharge   Problem: Self-Care: Goal: Ability to participate in self-care as condition permits will improve Outcome: Adequate for Discharge Goal: Ability to communicate needs accurately will improve Outcome: Adequate for Discharge   Problem: Nutrition: Goal: Risk of aspiration will decrease Outcome: Adequate for Discharge   Problem: Ischemic Stroke/TIA Tissue Perfusion: Goal: Complications of ischemic stroke/TIA will be minimized Outcome: Adequate for Discharge

## 2018-03-13 NOTE — Progress Notes (Addendum)
Patients bladder scan revealed 28ml. MD made aware- no new orders at this time. Patient stable for discharge. Nephew called to notify of patients discharge- per nephew he does not feel like it is "safe" to take the patient in. He verbalized the issue with drug abuse and per nephew does not want that issue in his house. Case manager and MD made aware. Currently trying to find arrangements for patients discharge.  Update 1729: per Education officer, museum, patient will be re-evalualted by psych and once cleared by them patient can go home.   Psych has cleared patient and patient will be going back home with fiance. Per patient fiance will be picking up patient. Patient made aware patients fiance will need to bring oxygen tank up to the room. Patient agreeable.

## 2018-03-13 NOTE — Care Management Note (Signed)
Case Management Note  Patient Details  Name: [name] MRN: [id number] Date of Birth: [birth year]  Subjective/Objective:    Previous plan for patient discharge to nephew Jason's house has changed as [name] refuses to take him home.  This RNCM spoke with patient via phone this evening.  He is planning to DC with girlfriend and her 28 mo. old child.  [facility] and [facility] refuse home health services for patient as he admits to polysubstance abuse and the home situation may be unsafe.  Per patient tonight on the phone; he and his girlfriend will need to find a new place to live because the home they live in now is the patients deceased girlfriends home and he has lived there since she passed with her 53 yr old son with Down's syndrome.  This child has social services in place and he feels now he and his girlfriend may not be able to return.  Offered outpatient PT services and patient declines.               Action/Plan:   Expected Discharge Date:  03/13/18               Expected Discharge Plan:  Home/Self Care  In-House Referral:     Discharge planning Services  CM Consult  Post Acute Care Choice:    Choice offered to:     DME Arranged:    DME Agency:     HH Arranged:    HH Agency:     Status of Service:  Completed, signed off  If discussed at [employer] of Stay Meetings, dates discussed:    Additional Comments:  [name], RN 03/13/2018, 6:06 PM

## 2018-03-13 NOTE — Evaluation (Addendum)
Speech Language Pathology Evaluation Patient Details Name: [name] MRN: [id number] DOB: [birth year] Today's Date: 03/13/2018 Time: 1115-1150 SLP Time Calculation (min) (ACUTE ONLY): 35 min  Problem List:  Patient Active Problem List   Diagnosis Date Noted  . Palliative care encounter   . Altered mental status, unspecified 03/08/2018  . CVA (cerebral vascular accident) ([place]) 03/08/2018  . Gastroenteritis 02/15/2018  . Fatigue due to treatment 09/07/2016  . Palliative care by specialist   . Advance care planning   . Other insomnia   . Protein-calorie malnutrition, severe 05/25/2016  . Aspiration pneumonia ([place]) 05/23/2016  . Cough in adult 05/05/2016  . Multiple myeloma in relapse ([place]) 09/09/2015  . Congestive heart failure ([place]) 01/06/2015  . Cardiomyopathy ([place]) 01/06/2015  . Nonsustained ventricular tachycardia ([place]) 01/06/2015  . Malnutrition of moderate degree 12/24/2014  . Pressure ulcer 12/24/2014  . Cellulitis of second finger of left hand   . Sepsis ([place]) 12/23/2014  . 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 Arteriosclerosis of coronary artery 08/22/2011  . Cardiac conduction disorder 08/22/2011  . Myocardial infarction ([place]) 08/22/2011  . Cardiac arrhythmia 08/22/2011  . Anxiety 08/21/2011  . Acid reflux 08/21/2011  . Chronic obstructive pulmonary disease ([place]) 07/26/2011  . Nicotine addiction 07/26/2011   Past Medical History:  Past Medical History:  Diagnosis Date  . Anxiety   . Arthritis   . Atrial septal defect   . CHF (congestive heart failure) ([place])   . COPD (chronic obstructive pulmonary disease) ([place])   . Coronary artery disease   . Dysrhythmias   . GERD (gastroesophageal reflux disease)   . Hyperlipidemia   . Multiple myeloma ([place])   . Multiple myeloma ([place])   . Myocardial infarction ([place])   . Substance abuse [facility])    Past Surgical History:  Past Surgical History:  Procedure Laterality Date  . CARDIAC  DEFIBRILLATOR PLACEMENT     HPI:  Per admitting H&P: pt is a 58 y.o. male with a known history of CAD, multiple myeloma chronic back pain, COPD, chronic respiratory failure on 2 L oxygen, diastolic CHF, atrial septal defect, tobacco use presents to the hospital brought in by his girlfriend due to hallucination and agitation at home.  IVC was placed.  Initially thought to be psychosis.  CT scan of the head shows right frontal CVA.  This is new from CT scan of the head in December.  Patient is confused unable to provide history.  Old records reviewed.  Discussed with ED staff.  No focal weakness found.  Being admitted for stroke.   Assessment / Plan / Recommendation Clinical Impression  ST consulted to administer cognitive-linguistic screen, to assess strengths and needs post right frontal CVA. ST administered the [facility] Cognitive Assessment (MOCA-B) at bedside to screen pt's executive function, immediate recall, fluency, orientation, calculation, abstraction, delayed recall, visuoperception, naming, and attention.  Pt presents w/ mild+ cognitive-linguistic deficits during informal screening; responds well to verbal cues during engagement. Pt shared he does have visual deficits and typically wears bi-focal glasses at home. Pt is right-handed and does not have any hearing impairments. Pt exhibits strengths in the areas of executive function (identifying and completing a sequencing pattern), immediate recall of 5 arbitrary words, orientation (to self, time, day, month, year, place, city), and naming (picture naming of animals). ST informally assessed pt's attention and noted he demonstrated adequate attention through eye contact, body language (nodding), and appropriately following directions.  Pt exhibited mild deficits in the areas of delayed recall, fluency,  and visuoperception. Pt correctly recalled the 5 arbitrary words used in the earlier in screen w/ category cues for every trial. Pt appropriately  named 10 fruits in ~1 minute, exhibiting a slight deficit in fluency (typically is 13+), however it is important to note that the pt expressed he was "trying to think about fruits he would see in the grocery store," demonstrating strategic thinking. Additionally, pt appears to exhibit a slight deficit in visuoperception, in which he was prompted to name typical objects in a jumbled picture. Pt correctly named 6/10, though 9-10 is typical. Important to note this could be d/t visual deficits and glasses not available for informal screen.  Pt appears to have more difficulty w/ higher-level tasks including calculation and abstraction (hypothetical  problem-solving how to make $13 w/ various different bills) and categorization (ex- train and boat).  Recommend: Pt could benefit from more formal cognitive-linguistic assessment to identify further, specific needs if deficits/changes from his baseline are noted at home. Pt to f/u w/ PCP at discharge for Skilled ST service referral d/t stating he was "not sure" he wanted any services at this time.   SLP Assessment  SLP Recommendation/Assessment: All further Speech Lanaguage Pathology  needs can be addressed in the next venue of care SLP Visit Diagnosis: Cognitive communication deficit (R41.841)    Follow Up Recommendations  Outpatient SLP(TBD)    Frequency and Duration min 1 x/week  2 weeks      SLP Evaluation Cognition  Overall Cognitive Status: Impaired/Different from baseline Arousal/Alertness: Awake/alert Orientation Level: Oriented X4 Attention: Focused;Divided Focused Attention: Appears intact Divided Attention: Appears intact Memory: Impaired Memory Impairment: Decreased recall of new information Awareness: Appears intact Problem Solving: Impaired Problem Solving Impairment: Verbal basic Executive Function: Sequencing;Decision Making;Initiating Sequencing: Appears intact Decision Making: Appears intact Initiating: Appears  intact Safety/Judgment: Appears intact       Comprehension  Auditory Comprehension Overall Auditory Comprehension: Appears within functional limits for tasks assessed Yes/No Questions: Within Functional Limits Commands: Within Functional Limits Conversation: Simple Interfering Components: Visual impairments;Processing speed;Working Field seismologist: Research officer, trade union Discrimination: Within [employer] Reading Comprehension Reading Status: Within funtional limits(typically wears bifocal glasses; N/A for screening procedure)    Expression Expression Primary Mode of Expression: Verbal Verbal Expression Overall Verbal Expression: Appears within functional limits for tasks assessed Initiation: No impairment Automatic Speech: Name;Social Response Level of Generative/Spontaneous Verbalization: Conversation Repetition: No impairment Naming: No impairment Pragmatics: No impairment Effective Techniques: Sentence completion;Phonemic cues Written Expression Dominant Hand: Right Written Expression: Not tested   Oral / Motor  Motor Speech Overall Motor Speech: Appears within functional limits for tasks assessed Respiration: Within functional limits Phonation: Normal;Low vocal intensity Resonance: Within functional limits Articulation: Within functional limitis Intelligibility: Intelligible Motor Planning: Witnin functional limits Motor Speech Errors: Not applicable   GO                    Emeline General, Graduate Student SLP 03/13/2018, 12:34 PM

## 2018-03-13 NOTE — Clinical Social Work Note (Signed)
CSW spoke to [place], [phone], and [phone], and informed her that patient's nephew Abrian Hanover, [phone] informed bedside nurse that he does not want to take patient back to his house which was the original plan.  CSW spoke to patient's nephew [name] [phone] and he is concerned for his grand kids safety due to patient's history of drug use.  Patient's sister also is unable to take him because she does not have the space for him because she only has a two bedroom apartment, and has a 39 year old daughter.  Patient's nephew and sister feel that when they are not home he may use drugs.  Patient's nephew does not want to take the chance because he is concerned for his safety and his grand kids who live behind nephew's house.  CSW spoke with the patient who was alert and oriented x4, and he also uses oxygen continuously.  CSW informed patient that his nephew is unable to have patient live with him.  Patient was disappointed, but he said he will talk with him at another time.  Patient said he plans to return to his fiance's home.  Patient was pleasant and talkative, CSW discussed with patient about history of substance abuse, patient states he has tried to go to rehab, he did better for awhile but then he would start using again.  CSW provided resources for patient on substance abuse in case he wants to try to go again.  Patient was appreciative of information given.  Patient talked about losing his wife to cancer, and she never informed him, patient expressed some sadness about losing her, he is also helping take care of a 60 year old at his current fiance's house.  Patient did not express any other concerns about returning back home, he said he is nervous but also looking forward to returning home.    Psych saw patient and stated he does have capacity to make his own decisions.    CSW updated APS worker on patient's discharge plan, she will continue to follow patient's case  after discharge.  CSW was informed that patient's fiancee will pick him up from the hospital and she will bring his portable oxygen tank.  Patient will be discharging back to the home with his girlfriend.  [name]. [place], MSW, [place]  03/13/2018 6:04 PM

## 2018-03-19 ENCOUNTER — Inpatient Hospital Stay: Payer: Medicaid Other | Attending: Internal Medicine | Admitting: Internal Medicine

## 2018-03-19 ENCOUNTER — Inpatient Hospital Stay: Payer: Medicaid Other

## 2018-03-19 VITALS — BP 108/78 | HR 112 | Temp 96.6°F | Resp 18 | Ht 72.0 in | Wt 127.0 lb

## 2018-03-19 DIAGNOSIS — C9002 Multiple myeloma in relapse: Secondary | ICD-10-CM | POA: Diagnosis present

## 2018-03-19 DIAGNOSIS — G8929 Other chronic pain: Secondary | ICD-10-CM | POA: Insufficient documentation

## 2018-03-19 DIAGNOSIS — F419 Anxiety disorder, unspecified: Secondary | ICD-10-CM | POA: Insufficient documentation

## 2018-03-19 DIAGNOSIS — I252 Old myocardial infarction: Secondary | ICD-10-CM | POA: Diagnosis not present

## 2018-03-19 DIAGNOSIS — Z95810 Presence of automatic (implantable) cardiac defibrillator: Secondary | ICD-10-CM | POA: Insufficient documentation

## 2018-03-19 DIAGNOSIS — F17210 Nicotine dependence, cigarettes, uncomplicated: Secondary | ICD-10-CM | POA: Insufficient documentation

## 2018-03-19 DIAGNOSIS — J449 Chronic obstructive pulmonary disease, unspecified: Secondary | ICD-10-CM | POA: Insufficient documentation

## 2018-03-19 DIAGNOSIS — Z7982 Long term (current) use of aspirin: Secondary | ICD-10-CM | POA: Diagnosis not present

## 2018-03-19 DIAGNOSIS — E785 Hyperlipidemia, unspecified: Secondary | ICD-10-CM | POA: Diagnosis not present

## 2018-03-19 DIAGNOSIS — Z79899 Other long term (current) drug therapy: Secondary | ICD-10-CM | POA: Diagnosis not present

## 2018-03-19 DIAGNOSIS — I509 Heart failure, unspecified: Secondary | ICD-10-CM | POA: Diagnosis not present

## 2018-03-19 DIAGNOSIS — I2510 Atherosclerotic heart disease of native coronary artery without angina pectoris: Secondary | ICD-10-CM | POA: Diagnosis not present

## 2018-03-19 DIAGNOSIS — R110 Nausea: Secondary | ICD-10-CM | POA: Insufficient documentation

## 2018-03-19 DIAGNOSIS — R5383 Other fatigue: Secondary | ICD-10-CM | POA: Insufficient documentation

## 2018-03-19 DIAGNOSIS — R5381 Other malaise: Secondary | ICD-10-CM | POA: Insufficient documentation

## 2018-03-19 DIAGNOSIS — M1990 Unspecified osteoarthritis, unspecified site: Secondary | ICD-10-CM | POA: Insufficient documentation

## 2018-03-19 DIAGNOSIS — C9000 Multiple myeloma not having achieved remission: Secondary | ICD-10-CM

## 2018-03-19 DIAGNOSIS — R410 Disorientation, unspecified: Secondary | ICD-10-CM | POA: Diagnosis not present

## 2018-03-19 DIAGNOSIS — K219 Gastro-esophageal reflux disease without esophagitis: Secondary | ICD-10-CM | POA: Insufficient documentation

## 2018-03-19 DIAGNOSIS — Z0289 Encounter for other administrative examinations: Secondary | ICD-10-CM

## 2018-03-19 LAB — CBC WITH DIFFERENTIAL/PLATELET
Abs Immature Granulocytes: 0.1 10*3/uL — ABNORMAL HIGH (ref 0.00–0.07)
Basophils Absolute: 0 10*3/uL (ref 0.0–0.1)
Basophils Relative: 0 %
Eosinophils Absolute: 0.2 10*3/uL (ref 0.0–0.5)
Eosinophils Relative: 2 %
HCT: 37.4 % — ABNORMAL LOW (ref 39.0–52.0)
Hemoglobin: 11.9 g/dL — ABNORMAL LOW (ref 13.0–17.0)
Immature Granulocytes: 1 %
Lymphocytes Relative: 13 %
Lymphs Abs: 1.7 10*3/uL (ref 0.7–4.0)
MCH: 27.7 pg (ref 26.0–34.0)
MCHC: 31.8 g/dL (ref 30.0–36.0)
MCV: 87.2 fL (ref 80.0–100.0)
MONOS PCT: 5 %
Monocytes Absolute: 0.7 10*3/uL (ref 0.1–1.0)
Neutro Abs: 10.1 10*3/uL — ABNORMAL HIGH (ref 1.7–7.7)
Neutrophils Relative %: 79 %
Platelets: 399 10*3/uL (ref 150–400)
RBC: 4.29 MIL/uL (ref 4.22–5.81)
RDW: 16.1 % — ABNORMAL HIGH (ref 11.5–15.5)
WBC: 12.8 10*3/uL — ABNORMAL HIGH (ref 4.0–10.5)
nRBC: 0 % (ref 0.0–0.2)

## 2018-03-19 LAB — COMPREHENSIVE METABOLIC PANEL
ALT: 8 U/L (ref 0–44)
AST: 15 U/L (ref 15–41)
Albumin: 3.6 g/dL (ref 3.5–5.0)
Alkaline Phosphatase: 43 U/L (ref 38–126)
Anion gap: 8 (ref 5–15)
BUN: 12 mg/dL (ref 6–20)
CO2: 26 mmol/L (ref 22–32)
Calcium: 8.8 mg/dL — ABNORMAL LOW (ref 8.9–10.3)
Chloride: 102 mmol/L (ref 98–111)
Creatinine, Ser: 0.76 mg/dL (ref 0.61–1.24)
GFR calc non Af Amer: >60 mL/min (ref >60)
GLUCOSE: 95 mg/dL (ref 70–99)
Potassium: 3.9 mmol/L (ref 3.5–5.1)
Sodium: 136 mmol/L (ref 135–145)
Total Bilirubin: 0.4 mg/dL (ref 0.3–1.2)
Total Protein: 7.1 g/dL (ref 6.5–8.1)

## 2018-03-19 MED ORDER — HYDROMORPHONE HCL 2 MG PO TABS: 2.0000 mg | ORAL_TABLET | ORAL | 0 refills | Status: DC | PRN

## 2018-03-19 MED ORDER — FENTANYL 25 MCG/HR TD PT72: 1.0000 | MEDICATED_PATCH | TRANSDERMAL | 0 refills | Status: DC

## 2018-03-19 MED ORDER — FENTANYL 100 MCG/HR TD PT72: 1.0000 | MEDICATED_PATCH | TRANSDERMAL | 0 refills | Status: DC

## 2018-03-19 NOTE — Progress Notes (Signed)
[facility] OFFICE PROGRESS NOTE  Patient Care Team: Petra Kuba, MD as PCP - General (Family Medicine)  Cancer Staging No matching staging information was found for the patient.   Oncology History   # SEP 2015- MULTIPLE MYELOMA [multiple PET pos Bone lesions; hypercalcemia s/p BMBx; FISH- Aneuploidy - gain of chromosome 7,9,15,FGFR3/4p16.3, and CCND1/11q13. Loss of MAF/16q23.1), cytogenetics normal 46XY] s/p Vel-Dex-Rev-Zometa; Excellent PR;   # MARCH 2016- REV-DEX ; FEB 2017-[discn Dex] Rev 25 mg 3 w On & 1 w Off; March 2017- M- protein 0.4gm/dl; K/L= 7.9; cont Rev;   # Revlimid HELD June 2018 [sec to ON[facility]]  # AUG 2019- RECURRENCE- NINLARO-DEX  # March 2017-  Left Middle Lobe cavitary lesion- ~46m- repeat Ct in 3-452m# AUG 2017- ONJ [[name], [place] #  ? ON[facility]- [id number] not follow up ]- DISCONT Zometa.; NO HBO sec to   # Chronic pain/Anxiety [ortho]  # COPD/smoking/ [[facility]- not candidate for BMT- sec to co-morbidities/poor nutritional status];     ------------------------------------------------------------------  DIAGNOSIS: MULTIPLE MYELOMA  STAGE: RECURRENT  ;GOALS: CONTROL  CURRENT/MOST RECENT THERAPY; AUG 2019- NINLARO-DEX      Multiple myeloma in relapse ([place]     INTERVAL HISTORY:  [name] [age].o.  male pleasant patient above history of recurrent/relapse multiple myeloma-currently on Ninlaro is here for follow-up.  Patient was recently admitted to hospital for mental status changes-initially thought to have a stroke on CT scan noncontrast.  However CT scan with contrast showed no acute stroke.   Patient also had episodes of confusion/thought to be encephalopathic-sundowning.  Patient since discharge complains of worsening pain in his back/joints all over.  He has been taking Dilaudid more frequently.  Review of Systems  Constitutional: Positive for malaise/fatigue. Negative for chills, diaphoresis and fever.  HENT:  Negative for nosebleeds and sore throat.   Eyes: Negative for double vision.  Respiratory: Positive for cough, shortness of breath and wheezing. Negative for hemoptysis and sputum production.   Cardiovascular: Negative for chest pain, palpitations, orthopnea and leg swelling.  Gastrointestinal: Positive for nausea. Negative for abdominal pain, blood in stool, constipation, diarrhea, heartburn, melena and vomiting.  Genitourinary: Negative for dysuria, frequency and urgency.  Musculoskeletal: Positive for back pain (worse) and joint pain.  Skin: Negative.  Negative for itching and rash.  Neurological: Negative for dizziness, tingling, focal weakness, weakness and headaches.  Endo/Heme/Allergies: Does not bruise/bleed easily.  Psychiatric/Behavioral: Negative for depression. The patient is not nervous/anxious and does not have insomnia.       PAST MEDICAL HISTORY :  Past Medical History:  Diagnosis Date  . Anxiety   . Arthritis   . Atrial septal defect   . CHF (congestive heart failure) ([place]  . COPD (chronic obstructive pulmonary disease) ([facility]CO[place]  . Coronary artery disease   . Dysrhythmias   . GERD (gastroesophageal reflux disease)   . Hyperlipidemia   . Multiple myeloma ([facility]  . Multiple myeloma (HC[facility] [place]  . Myocardial infarction ([facility]  . Substance abuse ([facility] [place]    PAST SURGICAL HISTORY :   Past Surgical History:  Procedure Laterality Date  . CARDIAC DEFIBRILLATOR PLACEMENT      FAMILY HISTORY :   Family History  Problem Relation Age of Onset  . COPD Mother   . CAD Mother   . Heart attack Father   . Prostate cancer Maternal Grandfather   . Kidney cancer Neg Hx   . Bladder Cancer Neg Hx  SOCIAL HISTORY:   Social History   Tobacco Use  . Smoking status: Current Every Day Smoker    Packs/day: 0.50    Years: 40.00    Pack years: 20.00    Types: Cigarettes  . Smokeless tobacco: Never Used  Substance Use Topics  . Alcohol use: No    Alcohol/week: 0.0 standard  drinks  . Drug use: No    ALLERGIES:  has No Known Allergies.  MEDICATIONS:  Current Outpatient Medications  Medication Sig Dispense Refill  . acetaminophen (TYLENOL) 325 MG tablet Take 2 tablets (650 mg total) by mouth every 6 (six) hours as needed for mild pain (or Fever >/= 101).    Marland Kitchen albuterol (PROVENTIL HFA;VENTOLIN HFA) 108 (90 Base) MCG/ACT inhaler Inhale 2 puffs into the lungs every 6 (six) hours as needed for wheezing or shortness of breath. 1 Inhaler 6  . ALPRAZolam (XANAX) 0.5 MG tablet Take 1 tablet (0.5 mg total) by mouth 3 (three) times daily as needed for anxiety. 90 tablet 1  . aspirin EC 81 MG EC tablet Take 1 tablet (81 mg total) by mouth daily.    Marland Kitchen atorvastatin (LIPITOR) 20 MG tablet Take 20 mg by mouth daily.  2  . clopidogrel (PLAVIX) 75 MG tablet Take 1 tablet (75 mg total) by mouth daily. 30 tablet 0  . dexamethasone (DECADRON) 4 MG tablet TAKE 3 TABLETS (12 MG TOTAL) BY MOUTH ONCE A WEEK. TAKE WITH BREAKFAST 12 tablet 4  . digoxin (LANOXIN) 0.25 MG tablet Take 1 tablet (0.25 mg total) by mouth daily. 30 tablet 0  . DULoxetine (CYMBALTA) 30 MG capsule TAKE ONE CAPSULE BY MOUTH EVERY DAY 90 capsule 1  . fentaNYL (DURAGESIC) 100 MCG/HR Place 1 patch onto the skin every other day. Use with a 25 mcg patch for total dose of 125 mcg 15 patch 0  . fentaNYL (DURAGESIC) 25 MCG/HR Place 1 patch onto the skin every other day. Use with a 100 mcg patch for total dose of 125 mcg 15 patch 0  . Fluticasone-Salmeterol (ADVAIR DISKUS) 250-50 MCG/DOSE AEPB Inhale 1 puff into the lungs 2 (two) times daily. 3 each 2  . gabapentin (NEURONTIN) 100 MG capsule Take 2 capsules (200 mg total) by mouth 3 (three) times daily. 180 capsule 3  . HYDROmorphone (DILAUDID) 2 MG tablet Take 1 tablet (2 mg total) by mouth every 4 (four) hours as needed for severe pain. 84 tablet 0  . ipratropium-albuterol (DUONEB) 0.5-2.5 (3) MG/3ML SOLN Take 3 mLs by nebulization every 4 (four) hours as needed. 360 mL 3   . megestrol (MEGACE) 20 MG tablet TAKE ONE TABLET BY MOUTH ONCE DAILY 30 tablet 2  . metoprolol tartrate (LOPRESSOR) 25 MG tablet Take 1 tablet (25 mg total) by mouth 2 (two) times daily. 60 tablet 0  . NINLARO 4 MG capsule TAKE 1 CAPSULE (4 MG TOTAL) BY MOUTH ONCE A WEEK. TAKE FOR 3 WEEKS ON, THEN 1 WEEK OFF. TAKE ON EMPTY STOMACH 1HR BEFORE OR 2HRS AFTER FOOD. 3 capsule 4  . ondansetron (ZOFRAN) 8 MG tablet 1 pill every 8 hours as needed. 40 tablet 0  . tamsulosin (FLOMAX) 0.4 MG CAPS capsule Take 1 capsule (0.4 mg total) by mouth daily. 30 capsule 0  . tiotropium (SPIRIVA HANDIHALER) 18 MCG inhalation capsule Place 1 capsule (18 mcg total) into inhaler and inhale daily. 30 capsule 6  . vitamin B-12 1000 MCG tablet Take 1 tablet (1,000 mcg total) by mouth daily. 30 tablet 0  .  naloxone (NARCAN) nasal spray 4 mg/0.1 mL For Opioid Overdose: Call 911 and administer a single spray of Narcan in one nostril. Repeat every 58mns as needed if no or minimal response (Patient not taking: Reported on 03/19/2018) 1 kit 2   No current facility-administered medications for this visit.     PHYSICAL EXAMINATION: ECOG PERFORMANCE STATUS: 2 - Symptomatic, <50% confined to bed  BP 108/78   Pulse (!) 112   Temp (!) 96.6 F (35.9 C) (Tympanic)   Resp 18   Ht 6' (1.829 m)   Wt 127 lb (57.6 kg)   BMI 17.22 kg/m   Filed Weights   03/19/18 1040  Weight: 127 lb (57.6 kg)    Physical Exam  Constitutional: He is oriented to person, place, and time.  Cachectic appearing Caucasian male patient; 2 L nasal cannula.  Accompanied by family.  He is walking by himself.  HENT:  Head: Normocephalic and atraumatic.  Mouth/Throat: Oropharynx is clear and moist. No oropharyngeal exudate.  Eyes: Pupils are equal, round, and reactive to light.  Neck: Normal range of motion. Neck supple.  Cardiovascular: Normal rate and regular rhythm.  Pulmonary/Chest: No respiratory distress. He has wheezes.  Bilateral decreased air  entry.  Abdominal: Soft. Bowel sounds are normal. He exhibits no distension and no mass. There is no abdominal tenderness. There is no rebound and no guarding.  Musculoskeletal: Normal range of motion.        General: No tenderness or edema.  Neurological: He is alert and oriented to person, place, and time.  Skin: Skin is warm.  Psychiatric: Affect normal.       LABORATORY DATA:  I have reviewed the data as listed    Component Value Date/Time   NA 136 03/19/2018 1017   NA 135 05/12/2014 1343   K 3.9 03/19/2018 1017   K 3.3 (L) 05/12/2014 1343   CL 102 03/19/2018 1017   CL 101 05/12/2014 1343   CO2 26 03/19/2018 1017   CO2 27 05/12/2014 1343   GLUCOSE 95 03/19/2018 1017   GLUCOSE 77 05/12/2014 1343   BUN 12 03/19/2018 1017   BUN 9 05/12/2014 1343   CREATININE 0.76 03/19/2018 1017   CREATININE 0.89 05/12/2014 1343   CALCIUM 8.8 (L) 03/19/2018 1017   CALCIUM 8.8 (L) 05/12/2014 1343   PROT 7.1 03/19/2018 1017   PROT 5.9 (L) 02/17/2014 1405   ALBUMIN 3.6 03/19/2018 1017   ALBUMIN 2.8 (L) 02/17/2014 1405   AST 15 03/19/2018 1017   AST 9 (L) 02/17/2014 1405   ALT 8 03/19/2018 1017   ALT 12 (L) 02/17/2014 1405   ALKPHOS 43 03/19/2018 1017   ALKPHOS 44 (L) 02/17/2014 1405   BILITOT 0.4 03/19/2018 1017   BILITOT 0.2 02/17/2014 1405   GFRNONAA >60 03/19/2018 1017   GFRNONAA >60 05/12/2014 1343   GFRAA >60 03/19/2018 1017   GFRAA >60 05/12/2014 1343    No results found for: SPEP, UPEP  Lab Results  Component Value Date   WBC 12.8 (H) 03/19/2018   NEUTROABS 10.1 (H) 03/19/2018   HGB 11.9 (L) 03/19/2018   HCT 37.4 (L) 03/19/2018   MCV 87.2 03/19/2018   PLT 399 03/19/2018      Chemistry      Component Value Date/Time   NA 136 03/19/2018 1017   NA 135 05/12/2014 1343   K 3.9 03/19/2018 1017   K 3.3 (L) 05/12/2014 1343   CL 102 03/19/2018 1017   CL 101 05/12/2014 1343  CO2 26 03/19/2018 1017   CO2 27 05/12/2014 1343   BUN 12 03/19/2018 1017   BUN 9  05/12/2014 1343   CREATININE 0.76 03/19/2018 1017   CREATININE 0.89 05/12/2014 1343      Component Value Date/Time   CALCIUM 8.8 (L) 03/19/2018 1017   CALCIUM 8.8 (L) 05/12/2014 1343   ALKPHOS 43 03/19/2018 1017   ALKPHOS 44 (L) 02/17/2014 1405   AST 15 03/19/2018 1017   AST 9 (L) 02/17/2014 1405   ALT 8 03/19/2018 1017   ALT 12 (L) 02/17/2014 1405   BILITOT 0.4 03/19/2018 1017   BILITOT 0.2 02/17/2014 1405       RADIOGRAPHIC STUDIES: I have personally reviewed the radiological images as listed and agreed with the findings in the report. No results found.   ASSESSMENT & PLAN:  Multiple myeloma in relapse ([place]) # MULTIPLE MYELOMA-AUG 2019-IgG kappa;M protein=0.6; kappa light chains 570/elevated. AUG 2019-  Bone survey shows progressive lesions in the bilateral humeri/femur. Oct 2019- PET- no lytic lesions noted. ? Worse- based on pain [see below]  # Currently on Ninlaro-Dex [start again 12/11]- PR noted; Jan 2020-0.3 -Kappa light chains- 2; M protein= 0.2 gm/dl.  Stable.   # Started back on NIlaro on 2/20- tolerating well.   # ?acute stroke/ mental status changes- on anti-platelet therapy; improved.   # Osteonecrosis of the jaw- X-geva discontinued. Stable.   #October 2019 PET scan Bil scattered lung nodules- up to 5 mm; will repeat imaging in April2020.   # chronic pain/anxiety- worsening; on fentanyl [169mg] q 48 hours; and Dilaudid 2 mg every 4 hours [increased]. Discussed with [name]. Will get skeletal survey.   # COPD COPD wheezing stable.  Continue inhalers.  # Chronic nausea-question secondary to Ninlaro.  Continue Zofran as needed.stable.   # Debility- hospital bed/ wheel chair;   DISPOSITION;  # Bone survey today # follow up in 2 week - MD-labs-cbc/bmp # follow up with [name] in 2 weeks-Dr.B   Orders Placed This Encounter  Procedures  . DG Bone Survey Met    Standing Status:   Future    Standing Expiration Date:   05/19/2019    Order Specific Question:    Reason for Exam (SYMPTOM  OR DIAGNOSIS REQUIRED)    Answer:   multiple myeloma    Order Specific Question:   [facility]    [facility]    Standing Status:   Future    Standing Expiration Date:   03/20/2019  . Basic metabolic panel    Standing Status:   Future    Standing Expiration Date:   03/20/2019  . Comprehensive Drug Analysis,[facility]    Standing Status:   Future    Standing Expiration Date:   03/20/2019   All questions were answered. The patient knows to call the clinic with any problems, questions or concerns.      GCammie Sickle MD 03/19/2018 1:36 PM

## 2018-03-19 NOTE — Assessment & Plan Note (Addendum)
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 AUG 2019-  Bone survey shows progressive lesions in the bilateral humeri/femur. Oct 2019- PET- no lytic lesions noted. ? Worse- based on pain [see below]  # Currently on Ninlaro-Dex [start again 12/11]- PR noted; Jan 2020-0.3 -Kappa light chains- 2; M protein= 0.2 gm/dl.  Stable.   # Started back on NIlaro on 2/20- tolerating well.   # ?acute stroke/ mental status changes- on anti-platelet therapy; improved.   # Osteonecrosis of the jaw- X-geva discontinued. Stable.   #October 2019 PET scan Bil scattered lung nodules- up to 5 mm; will repeat imaging in April2020.   # chronic pain/anxiety- worsening; on fentanyl [125mcg] q 48 hours; and Dilaudid 2 mg every 4 hours [increased]. Discussed with [name]. Will get skeletal survey.   # COPD COPD wheezing stable.  Continue inhalers.  # Chronic nausea-question secondary to Ninlaro.  Continue Zofran as needed.stable.   # Debility- hospital bed/ wheel chair;   DISPOSITION;  # Bone survey today # follow up in 2 week - MD-labs-cbc/bmp # follow up with [name] in 2 weeks-Dr.B 

## 2018-03-20 LAB — KAPPA/LAMBDA LIGHT CHAINS
Kappa free light chain: 23 mg/L — ABNORMAL HIGH (ref 3.3–19.4)
Kappa, lambda light chain ratio: 2.07 — ABNORMAL HIGH (ref 0.26–1.65)
Lambda free light chains: 11.1 mg/L (ref 5.7–26.3)

## 2018-03-22 ENCOUNTER — Telehealth: Payer: Self-pay | Admitting: *Deleted

## 2018-03-22 LAB — MULTIPLE MYELOMA PANEL, SERUM
Albumin SerPl Elph-Mcnc: 3.4 g/dL (ref 2.9–4.4)
Albumin/Glob SerPl: 1.1 (ref 0.7–1.7)
Alpha 1: 0.3 g/dL (ref 0.0–0.4)
Alpha2 Glob SerPl Elph-Mcnc: 1 g/dL (ref 0.4–1.0)
B-Globulin SerPl Elph-Mcnc: 0.8 g/dL (ref 0.7–1.3)
GAMMA GLOB SERPL ELPH-MCNC: 0.9 g/dL (ref 0.4–1.8)
GLOBULIN, TOTAL: 3.1 g/dL (ref 2.2–3.9)
IgA: 94 mg/dL (ref 90–386)
IgG (Immunoglobin G), Serum: 1087 mg/dL (ref 700–1600)
IgM (Immunoglobulin M), Srm: 56 mg/dL (ref 20–172)
M PROTEIN SERPL ELPH-MCNC: 0.4 g/dL — AB
Total Protein ELP: 6.5 g/dL (ref 6.0–8.5)

## 2018-03-22 NOTE — Telephone Encounter (Signed)
[facility]ders faxed to [facility]

## 2018-03-22 NOTE — Telephone Encounter (Signed)
Call from [facility] asking if we ordered his Hospital Bed and Wheelchair. Requesting a call with answer [phone]

## 2018-03-23 NOTE — Discharge Summary (Signed)
[facility] NAME: [name]    MR#:  [id number]  DATE OF BIRTH:  1960/11/11  DATE OF ADMISSION:  03/07/2018 ADMITTING PHYSICIAN: Hillary Bow, MD  DATE OF DISCHARGE: 03/13/2018  6:56 PM  PRIMARY CARE PHYSICIAN: Petra Kuba, MD    ADMISSION DIAGNOSIS:  Hallucinations [R44.3] Delirium [R41.0] CVA (cerebral vascular accident) ([place]) [I63.9] Cerebrovascular accident (CVA), unspecified mechanism ([place]) [I63.9] Chronic obstructive pulmonary disease, unspecified COPD type ([place]) [J44.9]  DISCHARGE DIAGNOSIS:  Principal Problem:   Altered mental status, unspecified Active Problems:   CVA (cerebral vascular accident) [facility])   Palliative care encounter   SECONDARY DIAGNOSIS:   Past Medical History:  Diagnosis Date  . Anxiety   . Arthritis   . Atrial septal defect   . CHF (congestive heart failure) ([place])   . COPD (chronic obstructive pulmonary disease) ([place])   . Coronary artery disease   . Dysrhythmias   . GERD (gastroesophageal reflux disease)   . Hyperlipidemia   . Multiple myeloma ([place])   . Multiple myeloma ([place])   . Myocardial infarction ([place])   . Substance abuse [facility])     HOSPITAL COURSE:   *SVT.   On metoprolol.  Digoxin scheduled daily added by cardiology. HR - 80s  *Acute CVA of right frontal lobe?  CTA does not show any acute findings. Unable to get MRI due to defibrillator Echocardiogram normal Aspirin and statin. - Lovenox for DVT prophylaxis. - PT/OT/Speech consult as needed per symptoms Discussed with neurology team. Not clear if this is CVA but recommendation to treat with ASA, Plavix and statin.  *Acute encephalopathy.  Agitated overnight. Likely psychosis/sundowning. Haldol QHS in hospital.  *Hypertension.  On metoprolol  *COPD with chronic respiratory failure. On 2-3 L oxygen. No wheezing. Continue home inhalers and nebulizers as needed.  *Multiple myeloma. Follows  outpatient with cancer center.  * Urinary retention Foley removed pt urinated fine by next day  DISCHARGE CONDITIONS:   Stable.  CONSULTS OBTAINED:  Treatment Team:  Catarina Hartshorn, MD Dionisio David, MD Lavella Hammock, MD  DRUG ALLERGIES:  No Known Allergies  DISCHARGE MEDICATIONS:   Allergies as of 03/13/2018   No Known Allergies     Medication List    TAKE these medications   acetaminophen 325 MG tablet Commonly known as:  TYLENOL Take 2 tablets (650 mg total) by mouth every 6 (six) hours as needed for mild pain (or Fever >/= 101).   albuterol 108 (90 Base) MCG/ACT inhaler Commonly known as:  PROVENTIL HFA;VENTOLIN HFA Inhale 2 puffs into the lungs every 6 (six) hours as needed for wheezing or shortness of breath.   ALPRAZolam 0.5 MG tablet Commonly known as:  XANAX Take 1 tablet (0.5 mg total) by mouth 3 (three) times daily as needed for anxiety.   aspirin 81 MG EC tablet Take 1 tablet (81 mg total) by mouth daily. What changed:    medication strength  how much to take   atorvastatin 20 MG tablet Commonly known as:  LIPITOR Take 20 mg by mouth daily.   clopidogrel 75 MG tablet Commonly known as:  PLAVIX Take 1 tablet (75 mg total) by mouth daily.   cyanocobalamin 1000 MCG tablet Take 1 tablet (1,000 mcg total) by mouth daily.   dexamethasone 4 MG tablet Commonly known as:  DECADRON TAKE 3 TABLETS (12 MG TOTAL) BY MOUTH ONCE A WEEK. TAKE WITH BREAKFAST   digoxin 0.25 MG tablet Commonly known as:  LANOXIN Take 1 tablet (0.25 mg total) by mouth daily.   DULoxetine 30 MG capsule Commonly known as:  CYMBALTA TAKE ONE CAPSULE BY MOUTH EVERY DAY   Fluticasone-Salmeterol 250-50 MCG/DOSE Aepb Commonly known as:  ADVAIR DISKUS Inhale 1 puff into the lungs 2 (two) times daily.   gabapentin 100 MG capsule Commonly known as:  NEURONTIN Take 2 capsules (200 mg total) by mouth 3 (three) times daily.   ipratropium-albuterol 0.5-2.5 (3) MG/3ML  Soln Commonly known as:  DUONEB Take 3 mLs by nebulization every 4 (four) hours as needed.   megestrol 20 MG tablet Commonly known as:  MEGACE TAKE ONE TABLET BY MOUTH ONCE DAILY   metoprolol tartrate 25 MG tablet Commonly known as:  LOPRESSOR Take 1 tablet (25 mg total) by mouth 2 (two) times daily. What changed:    medication strength  how much to take   naloxone 4 MG/0.1ML Liqd nasal spray kit Commonly known as:  NARCAN For Opioid Overdose: Call 911 and administer a single spray of Narcan in one nostril. Repeat every 21mns as needed if no or minimal response   NINLARO 4 MG capsule Generic drug:  ixazomib citrate TAKE 1 CAPSULE (4 MG TOTAL) BY MOUTH ONCE A WEEK. TAKE FOR 3 WEEKS ON, THEN 1 WEEK OFF. TAKE ON EMPTY STOMACH 1HR BEFORE OR 2HRS AFTER FOOD.   ondansetron 8 MG tablet Commonly known as:  ZOFRAN 1 pill every 8 hours as needed.   tamsulosin 0.4 MG Caps capsule Commonly known as:  FLOMAX Take 1 capsule (0.4 mg total) by mouth daily.   tiotropium 18 MCG inhalation capsule Commonly known as:  SPIRIVA HANDIHALER Place 1 capsule (18 mcg total) into inhaler and inhale daily.        DISCHARGE INSTRUCTIONS:    Follow with PCP in 1 week.  If you experience worsening of your admission symptoms, develop shortness of breath, life threatening emergency, suicidal or homicidal thoughts you must seek medical attention immediately by calling 911 or calling your MD immediately  if symptoms less severe.  You Must read complete instructions/literature along with all the possible adverse reactions/side effects for all the Medicines you take and that have been prescribed to you. Take any new Medicines after you have completely understood and accept all the possible adverse reactions/side effects.   Please note  You were cared for by a hospitalist during your hospital stay. If you have any questions about your discharge medications or the care you received while you were in the  hospital after you are discharged, you can call the unit and asked to speak with the hospitalist on call if the hospitalist that took care of you is not available. Once you are discharged, your primary care physician will handle any further medical issues. Please note that NO REFILLS for any discharge medications will be authorized once you are discharged, as it is imperative that you return to your primary care physician (or establish a relationship with a primary care physician if you do not have one) for your aftercare needs so that they can reassess your need for medications and monitor your lab values.    Today   CHIEF COMPLAINT:   Chief Complaint  Patient presents with  . Hallucinations    HISTORY OF PRESENT ILLNESS:  [name] is a 58y.o. male with a known history of CAD, multiple myeloma chronic back pain, COPD, chronic respiratory failure on 2 L oxygen, diastolic CHF, atrial septal defect, tobacco use presents to the  hospital brought in by his girlfriend due to hallucination and agitation at home.  IVC was placed.  Initially thought to be psychosis.  CT scan of the head shows right frontal CVA.  This is new from CT scan of the head in December.  Patient is confused unable to provide history.  Old records reviewed.  Discussed with ED staff.  No focal weakness found.  Being admitted for stroke.   VITAL SIGNS:  Blood pressure 102/70, pulse 81, temperature 98.3 F (36.8 C), temperature source Oral, resp. rate 18, height 6' (1.829 m), weight 55.1 kg, SpO2 94 %.  I/O:  No intake or output data in the 24 hours ending 03/23/18 0811  PHYSICAL EXAMINATION:   GENERAL:  58 y.o.-year-old patient lying in the bed with no acute distress.  EYES: Pupils equal, round, reactive to light and accommodation. No scleral icterus. Extraocular muscles intact.  HEENT: Head atraumatic, normocephalic. Oropharynx and nasopharynx clear.  NECK:  Supple, no jugular venous distention. No thyroid enlargement,  no tenderness.  LUNGS: Normal breath sounds bilaterally, no wheezing, rales, rhonchi. No use of accessory muscles of respiration. CARDIOVASCULAR: S1, S2 .  Tachycardia. ABDOMEN: Soft, nontender, nondistended. Bowel sounds present. No organomegaly or mass. EXTREMITIES: No cyanosis, clubbing or edema b/l. NEUROLOGIC: Not following commands PSYCHIATRIC: The patient is alert and oriented SKIN: No obvious rash, lesion, or ulcer.   DATA REVIEW:   CBC Recent Labs  Lab 03/19/18 1017  WBC 12.8*  HGB 11.9*  HCT 37.4*  PLT 399    Chemistries  Recent Labs  Lab 03/19/18 1017  NA 136  K 3.9  CL 102  CO2 26  GLUCOSE 95  BUN 12  CREATININE 0.76  CALCIUM 8.8*  AST 15  ALT 8  ALKPHOS 43  BILITOT 0.4    Cardiac Enzymes No results for input(s): TROPONINI in the last 168 hours.  Microbiology Results  Results for orders placed or performed during the hospital encounter of 02/15/18  Gastrointestinal Panel by PCR , Stool     Status: Abnormal   Collection Time: 02/15/18 11:52 AM  Result Value Ref Range Status   Campylobacter species NOT DETECTED NOT DETECTED Final   Plesimonas shigelloides NOT DETECTED NOT DETECTED Final   Salmonella species NOT DETECTED NOT DETECTED Final   Yersinia enterocolitica NOT DETECTED NOT DETECTED Final   Vibrio species NOT DETECTED NOT DETECTED Final   Vibrio cholerae NOT DETECTED NOT DETECTED Final   Enteroaggregative E coli (EAEC) NOT DETECTED NOT DETECTED Final   Enteropathogenic E coli (EPEC) NOT DETECTED NOT DETECTED Final   Enterotoxigenic E coli (ETEC) NOT DETECTED NOT DETECTED Final   Shiga like toxin producing E coli (STEC) NOT DETECTED NOT DETECTED Final   Shigella/Enteroinvasive E coli (EIEC) NOT DETECTED NOT DETECTED Final   Cryptosporidium NOT DETECTED NOT DETECTED Final   Cyclospora cayetanensis NOT DETECTED NOT DETECTED Final   Entamoeba histolytica NOT DETECTED NOT DETECTED Final   Giardia lamblia NOT DETECTED NOT DETECTED Final    Adenovirus F40/41 NOT DETECTED NOT DETECTED Final   Astrovirus NOT DETECTED NOT DETECTED Final   Norovirus GI/GII NOT DETECTED NOT DETECTED Final   Rotavirus A NOT DETECTED NOT DETECTED Final   Sapovirus (I, II, IV, and V) DETECTED (A) NOT DETECTED Final    Comment: Performed at [facility], [address] [place], [place] [address]  C difficile quick scan w PCR reflex     Status: None   Collection Time: 02/15/18 11:52 AM  Result Value Ref  Range Status   C Diff antigen NEGATIVE NEGATIVE Final   C Diff toxin NEGATIVE NEGATIVE Final   C Diff interpretation No C. difficile detected.  Final    Comment: Performed at [facility], [address] [place], [place] [address]  Blood culture (routine x 2)     Status: None   Collection Time: 02/15/18  2:52 PM  Result Value Ref Range Status   Specimen Description BLOOD BLOOD RIGHT FOREARM  Final   Special Requests   Final    BOTTLES DRAWN AEROBIC AND ANAEROBIC Blood Culture results may not be optimal due to an excessive volume of blood received in culture bottles   Culture   Final    NO GROWTH 5 DAYS Performed at [facility], [address], [place], [place] [address]    Report Status 02/20/2018 FINAL  Final  Blood culture (routine x 2)     Status: None   Collection Time: 02/15/18  2:54 PM  Result Value Ref Range Status   Specimen Description BLOOD BLOOD RIGHT WRIST  Final   Special Requests   Final    BOTTLES DRAWN AEROBIC AND ANAEROBIC Blood Culture adequate volume   Culture   Final    NO GROWTH 5 DAYS Performed at [facility], [address] [place], [place] [address]    Report Status 02/20/2018 FINAL  Final    RADIOLOGY:  No results found.  EKG:   Orders placed or performed during the hospital encounter of 03/07/18  . EKG 12-Lead  . EKG 12-Lead  . EKG 12-Lead  . EKG 12-Lead  . EKG 12-Lead  . EKG 12-Lead  . EKG 12-Lead  . EKG 12-Lead  . EKG 12-Lead  . EKG 12-Lead       Management plans discussed with the patient, family and they are in agreement.  CODE STATUS:  Code Status History    Date Active Date Inactive Code Status Order ID Comments User Context   03/08/2018 1322 03/13/2018 2201 Full Code [id number]  Hillary Bow, MD ED   02/15/2018 1602 02/18/2018 2010 Full Code [id number]  Fritzi Mandes, MD Inpatient   05/26/2016 1100 05/28/2016 1813 DNR [id number]  Nicholes Mango, MD Inpatient   05/24/2016 1740 05/26/2016 1059 Full Code [id number]  Awilda Bill, NP Inpatient   05/23/2016 1915 05/24/2016 1740 DNR [id number]  Hillary Bow, MD ED   05/23/2016 1353 05/23/2016 1915 DNR [id number]  Eula Listen, MD ED   12/23/2014 1801 12/27/2014 1629 Full Code [id number]  Theodoro Grist, MD Inpatient    Advance Directive Documentation     Most Recent Value  Type of Advance Directive  Living will, Healthcare Power of Attorney  Pre-existing out of facility DNR order (yellow form or pink MOST form)  -  "MOST" Form in Place?  -      TOTAL TIME TAKING CARE OF THIS PATIENT: 35 minutes.    Vaughan Basta M.D on 03/23/2018 at 8:11 AM  Between 7am to 6pm - Pager - [phone]  After 6pm go to [email or web address] - password [email or web address] [facility]  Office  [phone]  CC: Primary care physician; Petra Kuba, MD   Note: This dictation was prepared with Dragon dictation along with smaller phrase technology. Any transcriptional errors that result from this process are unintentional.

## 2018-03-28 ENCOUNTER — Other Ambulatory Visit: Payer: Self-pay | Admitting: *Deleted

## 2018-03-28 DIAGNOSIS — C9000 Multiple myeloma not having achieved remission: Secondary | ICD-10-CM

## 2018-03-29 ENCOUNTER — Other Ambulatory Visit: Payer: Self-pay | Admitting: Internal Medicine

## 2018-03-29 DIAGNOSIS — C9000 Multiple myeloma not having achieved remission: Secondary | ICD-10-CM

## 2018-03-29 MED ORDER — DULOXETINE HCL 30 MG PO CPEP: 30.0000 mg | ORAL_CAPSULE | Freq: Every day | ORAL | 3 refills | Status: DC

## 2018-03-29 MED ORDER — GABAPENTIN 100 MG PO CAPS: 200.0000 mg | ORAL_CAPSULE | Freq: Three times a day (TID) | ORAL | 3 refills | Status: DC

## 2018-04-02 ENCOUNTER — Inpatient Hospital Stay: Payer: Medicaid Other | Attending: Internal Medicine

## 2018-04-02 ENCOUNTER — Encounter: Payer: Self-pay | Admitting: Internal Medicine

## 2018-04-02 ENCOUNTER — Inpatient Hospital Stay (HOSPITAL_BASED_OUTPATIENT_CLINIC_OR_DEPARTMENT_OTHER): Payer: Medicaid Other | Admitting: Hospice and Palliative Medicine

## 2018-04-02 ENCOUNTER — Other Ambulatory Visit: Payer: Self-pay

## 2018-04-02 ENCOUNTER — Other Ambulatory Visit: Payer: Self-pay | Admitting: *Deleted

## 2018-04-02 ENCOUNTER — Inpatient Hospital Stay (HOSPITAL_BASED_OUTPATIENT_CLINIC_OR_DEPARTMENT_OTHER): Payer: Medicaid Other | Admitting: Internal Medicine

## 2018-04-02 VITALS — BP 90/63 | HR 111 | Temp 97.6°F | Resp 18

## 2018-04-02 DIAGNOSIS — Z515 Encounter for palliative care: Secondary | ICD-10-CM

## 2018-04-02 DIAGNOSIS — Z9981 Dependence on supplemental oxygen: Secondary | ICD-10-CM | POA: Diagnosis not present

## 2018-04-02 DIAGNOSIS — Z7982 Long term (current) use of aspirin: Secondary | ICD-10-CM

## 2018-04-02 DIAGNOSIS — R5383 Other fatigue: Secondary | ICD-10-CM

## 2018-04-02 DIAGNOSIS — I5040 Unspecified combined systolic (congestive) and diastolic (congestive) heart failure: Secondary | ICD-10-CM | POA: Insufficient documentation

## 2018-04-02 DIAGNOSIS — C9000 Multiple myeloma not having achieved remission: Secondary | ICD-10-CM

## 2018-04-02 DIAGNOSIS — Z7902 Long term (current) use of antithrombotics/antiplatelets: Secondary | ICD-10-CM | POA: Insufficient documentation

## 2018-04-02 DIAGNOSIS — K219 Gastro-esophageal reflux disease without esophagitis: Secondary | ICD-10-CM

## 2018-04-02 DIAGNOSIS — R531 Weakness: Secondary | ICD-10-CM

## 2018-04-02 DIAGNOSIS — I252 Old myocardial infarction: Secondary | ICD-10-CM

## 2018-04-02 DIAGNOSIS — I509 Heart failure, unspecified: Secondary | ICD-10-CM

## 2018-04-02 DIAGNOSIS — K0889 Other specified disorders of teeth and supporting structures: Secondary | ICD-10-CM | POA: Diagnosis not present

## 2018-04-02 DIAGNOSIS — R5381 Other malaise: Secondary | ICD-10-CM | POA: Insufficient documentation

## 2018-04-02 DIAGNOSIS — M549 Dorsalgia, unspecified: Secondary | ICD-10-CM

## 2018-04-02 DIAGNOSIS — I2510 Atherosclerotic heart disease of native coronary artery without angina pectoris: Secondary | ICD-10-CM | POA: Diagnosis not present

## 2018-04-02 DIAGNOSIS — M1990 Unspecified osteoarthritis, unspecified site: Secondary | ICD-10-CM | POA: Insufficient documentation

## 2018-04-02 DIAGNOSIS — R110 Nausea: Secondary | ICD-10-CM | POA: Diagnosis not present

## 2018-04-02 DIAGNOSIS — Q211 Atrial septal defect: Secondary | ICD-10-CM | POA: Diagnosis not present

## 2018-04-02 DIAGNOSIS — R05 Cough: Secondary | ICD-10-CM | POA: Insufficient documentation

## 2018-04-02 DIAGNOSIS — F419 Anxiety disorder, unspecified: Secondary | ICD-10-CM | POA: Insufficient documentation

## 2018-04-02 DIAGNOSIS — J449 Chronic obstructive pulmonary disease, unspecified: Secondary | ICD-10-CM

## 2018-04-02 DIAGNOSIS — E785 Hyperlipidemia, unspecified: Secondary | ICD-10-CM

## 2018-04-02 DIAGNOSIS — F17210 Nicotine dependence, cigarettes, uncomplicated: Secondary | ICD-10-CM

## 2018-04-02 DIAGNOSIS — C9002 Multiple myeloma in relapse: Secondary | ICD-10-CM

## 2018-04-02 DIAGNOSIS — G893 Neoplasm related pain (acute) (chronic): Secondary | ICD-10-CM | POA: Insufficient documentation

## 2018-04-02 DIAGNOSIS — Z95810 Presence of automatic (implantable) cardiac defibrillator: Secondary | ICD-10-CM

## 2018-04-02 DIAGNOSIS — G8929 Other chronic pain: Secondary | ICD-10-CM | POA: Diagnosis not present

## 2018-04-02 DIAGNOSIS — Z79899 Other long term (current) drug therapy: Secondary | ICD-10-CM

## 2018-04-02 DIAGNOSIS — Z0289 Encounter for other administrative examinations: Secondary | ICD-10-CM

## 2018-04-02 DIAGNOSIS — Z85810 Personal history of malignant neoplasm of tongue: Secondary | ICD-10-CM

## 2018-04-02 LAB — BASIC METABOLIC PANEL
Anion gap: 7 (ref 5–15)
BUN: 10 mg/dL (ref 6–20)
CALCIUM: 8.8 mg/dL — AB (ref 8.9–10.3)
CO2: 28 mmol/L (ref 22–32)
Chloride: 103 mmol/L (ref 98–111)
Creatinine, Ser: 0.73 mg/dL (ref 0.61–1.24)
GFR calc Af Amer: >60 mL/min (ref >60)
GFR calc non Af Amer: >60 mL/min (ref >60)
Glucose, Bld: 96 mg/dL (ref 70–99)
Potassium: 3.2 mmol/L — ABNORMAL LOW (ref 3.5–5.1)
Sodium: 138 mmol/L (ref 135–145)

## 2018-04-02 LAB — CBC WITH DIFFERENTIAL/PLATELET
Abs Immature Granulocytes: 0.02 10*3/uL (ref 0.00–0.07)
Basophils Absolute: 0 10*3/uL (ref 0.0–0.1)
Basophils Relative: 0 %
Eosinophils Absolute: 0.4 10*3/uL (ref 0.0–0.5)
Eosinophils Relative: 4 %
HCT: 39.2 % (ref 39.0–52.0)
Hemoglobin: 12.5 g/dL — ABNORMAL LOW (ref 13.0–17.0)
Immature Granulocytes: 0 %
Lymphocytes Relative: 18 %
Lymphs Abs: 1.6 10*3/uL (ref 0.7–4.0)
MCH: 27.9 pg (ref 26.0–34.0)
MCHC: 31.9 g/dL (ref 30.0–36.0)
MCV: 87.5 fL (ref 80.0–100.0)
MONO ABS: 1.1 10*3/uL — AB (ref 0.1–1.0)
Monocytes Relative: 12 %
Neutro Abs: 6.2 10*3/uL (ref 1.7–7.7)
Neutrophils Relative %: 66 %
Platelets: 182 10*3/uL (ref 150–400)
RBC: 4.48 MIL/uL (ref 4.22–5.81)
RDW: 15 % (ref 11.5–15.5)
WBC: 9.3 10*3/uL (ref 4.0–10.5)
nRBC: 0 % (ref 0.0–0.2)

## 2018-04-02 MED ORDER — HYDROMORPHONE HCL 2 MG PO TABS: 2.0000 mg | ORAL_TABLET | ORAL | 0 refills | Status: DC | PRN

## 2018-04-02 MED ORDER — ONDANSETRON HCL 8 MG PO TABS: ORAL_TABLET | ORAL | 3 refills | Status: DC

## 2018-04-02 NOTE — Telephone Encounter (Signed)
Patient is out of medicine and needs this filled TODAY

## 2018-04-02 NOTE — Progress Notes (Signed)
[place]  Telephone:([phone] [phone]   Name: [name] Date: 04/02/2018 MRN: [id number]  DOB: 12/06/1960  Patient Care Team: Petra Kuba, MD as PCP - General (Family Medicine)    REASON FOR CONSULTATION: Palliative Care consult requested for this 58 y.o. male with multiple medical problems including recurrent multiple myeloma with multiple PET positive bone lesions most recently treated on Nin[place]o, O2 dependent COPD, diastolic dysfunction with history of CHF, atrial septal defect, and tobacco abuse.  Patient was admitted to the hospital on 03/08/2018 to 03/13/2018 under involuntary commitment for agitation and hallucinations.  Initially, patient was thought to have had an acute CVA on head CT but reimaging appeared consistent with artifact.  AMS was thought probably related to medications.  Patient has had chronic pain and anxiety on fentanyl and hydromorphone. Palliative care was consulted to help address goals and assist with symptom management.  SOCIAL HISTORY:    Patient is not married but lives at home with a significant other and several unrelated friends.  He has 2 sons and a daughter but reportedly is estranged from children.  ADVANCE DIRECTIVES:  Patient significant other is his healthcare power of attorney  CODE STATUS: Limited code (MOST form completed on 04/02/18)  PAST MEDICAL HISTORY: Past Medical History:  Diagnosis Date  . Anxiety   . Arthritis   . Atrial septal defect   . CHF (congestive heart failure) ([place])   . COPD (chronic obstructive pulmonary disease) ([place])   . Coronary artery disease   . Dysrhythmias   . GERD (gastroesophageal reflux disease)   . Hyperlipidemia   . Multiple myeloma ([place])   . Multiple myeloma ([place])   . Myocardial infarction ([place])   . Substance abuse ([place])     PAST SURGICAL HISTORY:  Past Surgical History:  Procedure Laterality Date  . CARDIAC DEFIBRILLATOR  PLACEMENT      HEMATOLOGY/ONCOLOGY HISTORY:  Oncology History   # SEP 2015- MULTIPLE MYELOMA [multiple PET pos Bone lesions; hypercalcemia s/p BMBx; FISH- Aneuploidy - gain of chromosome 7,9,15,FGFR3/4p16.3, and CCND1/11q13. Loss of MAF/16q23.1), cytogenetics normal 46XY] s/p Vel-Dex-Rev-Zometa; Excellent PR;   # MARCH 2016- REV-DEX ; FEB 2017-[discn Dex] Rev 25 mg 3 w On & 1 w Off; March 2017- M- protein 0.4gm/dl; K/L= 7.9; cont Rev;   # Revlimid HELD June 2018 [sec to [facility]]  # AUG 2019- RECURRENCE- NINLARO-DEX  # March 2017-  Left Middle Lobe cavitary lesion- ~92m- repeat Ct in 3-421m# AUG 2017- [facility] [[name], [place] #  ? [facility]- [id number] not follow up ]- DISCONT Zometa.; NO HBO sec to   # Chronic pain/Anxiety [ortho]  # COPD/smoking/ [[facility]- not candidate for BMT- sec to co-morbidities/poor nutritional status];     ------------------------------------------------------------------  DIAGNOSIS: MULTIPLE MYELOMA  STAGE: RECURRENT  ;GOALS: CONTROL  CURRENT/MOST RECENT THERAPY; AUG 2019- NINLARO-DEX      Multiple myeloma in relapse ([place]   ALLERGIES:  has No Known Allergies.  MEDICATIONS:  Current Outpatient Medications  Medication Sig Dispense Refill  . acetaminophen (TYLENOL) 325 MG tablet Take 2 tablets (650 mg total) by mouth every 6 (six) hours as needed for mild pain (or Fever >/= 101).    . Marland Kitchenlbuterol (PROVENTIL HFA;VENTOLIN HFA) 108 (90 Base) MCG/ACT inhaler Inhale 2 puffs into the lungs every 6 (six) hours as needed for wheezing or shortness of breath. 1 Inhaler 6  . ALPRAZolam (XANAX) 0.5 MG tablet Take 1 tablet (0.5 mg total) by  mouth 3 (three) times daily as needed for anxiety. 90 tablet 1  . aspirin EC 81 MG EC tablet Take 1 tablet (81 mg total) by mouth daily.    Marland Kitchen atorvastatin (LIPITOR) 20 MG tablet Take 20 mg by mouth daily.  2  . clopidogrel (PLAVIX) 75 MG tablet Take 1 tablet (75 mg total) by mouth daily. 30 tablet 0  . dexamethasone (DECADRON) 4 MG  tablet TAKE 3 TABLETS (12 MG TOTAL) BY MOUTH ONCE A WEEK. TAKE WITH BREAKFAST 12 tablet 4  . digoxin (LANOXIN) 0.25 MG tablet Take 1 tablet (0.25 mg total) by mouth daily. 30 tablet 0  . DULoxetine (CYMBALTA) 30 MG capsule Take 1 capsule (30 mg total) by mouth daily. 90 capsule 3  . fentaNYL (DURAGESIC) 100 MCG/HR Place 1 patch onto the skin every other day. Use with a 25 mcg patch for total dose of 125 mcg 15 patch 0  . fentaNYL (DURAGESIC) 25 MCG/HR Place 1 patch onto the skin every other day. Use with a 100 mcg patch for total dose of 125 mcg 15 patch 0  . Fluticasone-Salmeterol (ADVAIR DISKUS) 250-50 MCG/DOSE AEPB Inhale 1 puff into the lungs 2 (two) times daily. 3 each 2  . gabapentin (NEURONTIN) 100 MG capsule Take 2 capsules (200 mg total) by mouth 3 (three) times daily. 180 capsule 3  . HYDROmorphone (DILAUDID) 2 MG tablet Take 1 tablet (2 mg total) by mouth every 4 (four) hours as needed for severe pain. 84 tablet 0  . ipratropium-albuterol (DUONEB) 0.5-2.5 (3) MG/3ML SOLN Take 3 mLs by nebulization every 4 (four) hours as needed. 360 mL 3  . megestrol (MEGACE) 20 MG tablet TAKE ONE TABLET BY MOUTH ONCE DAILY 30 tablet 2  . metoprolol tartrate (LOPRESSOR) 25 MG tablet Take 1 tablet (25 mg total) by mouth 2 (two) times daily. 60 tablet 0  . naloxone (NARCAN) nasal spray 4 mg/0.1 mL For Opioid Overdose: Call 911 and administer a single spray of Narcan in one nostril. Repeat every 13mns as needed if no or minimal response (Patient not taking: Reported on 03/19/2018) 1 kit 2  . NINLARO 4 MG capsule TAKE 1 CAPSULE (4 MG TOTAL) BY MOUTH ONCE A WEEK. TAKE FOR 3 WEEKS ON, THEN 1 WEEK OFF. TAKE ON EMPTY STOMACH 1HR BEFORE OR 2HRS AFTER FOOD. (Patient not taking: Reported on 04/02/2018) 3 capsule 4  . ondansetron (ZOFRAN) 8 MG tablet 1 pill every 8 hours as needed. 60 tablet 3  . tamsulosin (FLOMAX) 0.4 MG CAPS capsule Take 1 capsule (0.4 mg total) by mouth daily. (Patient not taking: Reported on  04/02/2018) 30 capsule 0  . tiotropium (SPIRIVA HANDIHALER) 18 MCG inhalation capsule Place 1 capsule (18 mcg total) into inhaler and inhale daily. 30 capsule 6  . vitamin B-12 1000 MCG tablet Take 1 tablet (1,000 mcg total) by mouth daily. 30 tablet 0   No current facility-administered medications for this visit.     VITAL SIGNS: There were no vitals taken for this visit. There were no vitals filed for this visit.  Estimated body mass index is 17.22 kg/m as calculated from the following:   Height as of 03/19/18: 6' (1.829 m).   Weight as of 03/19/18: 127 lb (57.6 kg).  LABS: CBC:    Component Value Date/Time   WBC 9.3 04/02/2018 1036   HGB 12.5 (L) 04/02/2018 1036   HGB 10.6 (L) 05/12/2014 1343   HCT 39.2 04/02/2018 1036   HCT 30.9 (L) 05/12/2014 1343  PLT 182 04/02/2018 1036   PLT 258 05/12/2014 1343   MCV 87.5 04/02/2018 1036   MCV 92 05/12/2014 1343   NEUTROABS 6.2 04/02/2018 1036   NEUTROABS 3.6 05/12/2014 1343   LYMPHSABS 1.6 04/02/2018 1036   LYMPHSABS 1.2 05/12/2014 1343   MONOABS 1.1 (H) 04/02/2018 1036   MONOABS 0.7 05/12/2014 1343   EOSABS 0.4 04/02/2018 1036   EOSABS 0.2 05/12/2014 1343   BASOSABS 0.0 04/02/2018 1036   BASOSABS 0.1 05/12/2014 1343   Comprehensive Metabolic Panel:    Component Value Date/Time   NA 138 04/02/2018 1036   NA 135 05/12/2014 1343   K 3.2 (L) 04/02/2018 1036   K 3.3 (L) 05/12/2014 1343   CL 103 04/02/2018 1036   CL 101 05/12/2014 1343   CO2 28 04/02/2018 1036   CO2 27 05/12/2014 1343   BUN 10 04/02/2018 1036   BUN 9 05/12/2014 1343   CREATININE 0.73 04/02/2018 1036   CREATININE 0.89 05/12/2014 1343   GLUCOSE 96 04/02/2018 1036   GLUCOSE 77 05/12/2014 1343   CALCIUM 8.8 (L) 04/02/2018 1036   CALCIUM 8.8 (L) 05/12/2014 1343   AST 15 03/19/2018 1017   AST 9 (L) 02/17/2014 1405   ALT 8 03/19/2018 1017   ALT 12 (L) 02/17/2014 1405   ALKPHOS 43 03/19/2018 1017   ALKPHOS 44 (L) 02/17/2014 1405   BILITOT 0.4 03/19/2018 1017     BILITOT 0.2 02/17/2014 1405   PROT 7.1 03/19/2018 1017   PROT 5.9 (L) 02/17/2014 1405   ALBUMIN 3.6 03/19/2018 1017   ALBUMIN 2.8 (L) 02/17/2014 1405    RADIOGRAPHIC STUDIES: Ct Angio Head W Or Wo Contrast  Result Date: 03/09/2018 CLINICAL DATA:  Altered mental status. Possible right frontal lobe infarct on CT. EXAM: CT ANGIOGRAPHY HEAD AND NECK TECHNIQUE: Multidetector CT imaging of the head and neck was performed using the standard protocol during bolus administration of intravenous contrast. Multiplanar CT image reconstructions and MIPs were obtained to evaluate the vascular anatomy. Carotid stenosis measurements (when applicable) are obtained utilizing NASCET criteria, using the distal internal carotid diameter as the denominator. CONTRAST:  6m OMNIPAQUE IOHEXOL 350 MG/ML SOLN COMPARISON:  Head CT 03/08/2018. Carotid Doppler ultrasound 05/29/2012. FINDINGS: CT HEAD FINDINGS Brain: No definite acute infarct, intracranial hemorrhage, mass, midline shift, or extra-axial fluid collection is identified. The focal region of hypodensity in the right frontal lobe described on yesterday's CT is not clearly persistent on today's study and may have reflected volume averaging through a sulcus. There is mild cerebral atrophy. Scattered cerebral white matter hypodensities are nonspecific but compatible with mild chronic small vessel ischemic disease. Vascular: Calcified atherosclerosis at the skull base. No hyperdense vessel. Skull: No fracture or focal osseous lesion. Sinuses: Right maxillary sinus mucous retention cyst. Clear mastoid air cells. Orbits: Unremarkable. Review of the MIP images confirms the above findings CTA NECK FINDINGS Aortic arch: Standard 3 vessel aortic arch with widely patent arch vessel origins. Right carotid system: Patent with mild calcified plaque at the carotid bifurcation. No evidence of significant stenosis or dissection. Left carotid system: Patent with moderate calcified plaque  at the carotid bifurcation. No evidence of significant stenosis or dissection. Tortuous distal cervical ICA. Vertebral arteries: Patent without evidence of significant stenosis or dissection. Moderately dominant left vertebral artery. Skeleton: Poor dentition with multiple dental caries and missing teeth. Other neck: No evidence of acute abnormality or mass. Upper chest: Emphysema. Review of the MIP images confirms the above findings CTA HEAD FINDINGS Anterior circulation: The internal  carotid arteries are patent from skull base to carotid termini with nonstenotic plaque bilaterally. ACAs and MCAs are patent without evidence of proximal branch occlusion or significant proximal stenosis. No aneurysm is identified. Posterior circulation: The intracranial vertebral arteries are patent to the basilar. Patent PICA and SCA origins are identified bilaterally. The basilar artery is patent with a mild stenosis in its midportion. There are moderately large posterior communicating arteries bilaterally with hypoplasia of the P1 segments. No flow limiting proximal PCA stenosis is evident. No aneurysm is identified. Venous sinuses: Patent. Anatomic variants: Predominantly fetal origin of the PCAs. Delayed phase: No abnormal enhancement. Review of the MIP images confirms the above findings IMPRESSION: 1. Intracranial atherosclerosis without major branch occlusion or flow limiting proximal stenosis. 2. Widely patent cervical carotid and vertebral arteries. 3. No evidence of acute infarct or hemorrhage on noncontrast head CT. 4.  Emphysema (ICD10-J43.9). Electronically Signed   By: Logan Bores M.D.   On: 03/09/2018 14:06   Ct Head Wo Contrast  Result Date: 03/08/2018 CLINICAL DATA:  Altered mental status with hallucinations and agitation EXAM: CT HEAD WITHOUT CONTRAST TECHNIQUE: Contiguous axial images were obtained from the base of the skull through the vertex without intravenous contrast. COMPARISON:  January 03, 2018  FINDINGS: Brain: The ventricles are normal in size and configuration. There is no demonstrable mass, hemorrhage, extra-axial fluid collection, or midline shift. There is a focal area of decreased attenuation in the periphery of the posterior mid right frontal lobe, concerning for potential early infarct. Elsewhere brain parenchyma appears unremarkable. Vascular: No appreciable hyperdense vessel. There is calcification in each cavernous carotid artery region. Skull: The bony calvarium appears intact. Sinuses/Orbits: There is mucosal thickening in several ethmoid air cells. There is a retention cyst in the inferior right maxillary antrum. Orbits appear symmetric bilaterally. Other: Mastoid air cells are clear. IMPRESSION: Decreased attenuation in the posterior mid right frontal lobe, best appreciated on axial slice 24 series 7. Question early infarct in this area. Brain parenchyma elsewhere appears unremarkable. No mass or hemorrhage. There are foci of arterial vascular calcification. There are areas of paranasal sinus disease. Electronically Signed   By: Lowella Grip III M.D.   On: 03/08/2018 07:38   Ct Angio Neck W Or Wo Contrast  Result Date: 03/09/2018 CLINICAL DATA:  Altered mental status. Possible right frontal lobe infarct on CT. EXAM: CT ANGIOGRAPHY HEAD AND NECK TECHNIQUE: Multidetector CT imaging of the head and neck was performed using the standard protocol during bolus administration of intravenous contrast. Multiplanar CT image reconstructions and MIPs were obtained to evaluate the vascular anatomy. Carotid stenosis measurements (when applicable) are obtained utilizing NASCET criteria, using the distal internal carotid diameter as the denominator. CONTRAST:  38m OMNIPAQUE IOHEXOL 350 MG/ML SOLN COMPARISON:  Head CT 03/08/2018. Carotid Doppler ultrasound 05/29/2012. FINDINGS: CT HEAD FINDINGS Brain: No definite acute infarct, intracranial hemorrhage, mass, midline shift, or extra-axial fluid  collection is identified. The focal region of hypodensity in the right frontal lobe described on yesterday's CT is not clearly persistent on today's study and may have reflected volume averaging through a sulcus. There is mild cerebral atrophy. Scattered cerebral white matter hypodensities are nonspecific but compatible with mild chronic small vessel ischemic disease. Vascular: Calcified atherosclerosis at the skull base. No hyperdense vessel. Skull: No fracture or focal osseous lesion. Sinuses: Right maxillary sinus mucous retention cyst. Clear mastoid air cells. Orbits: Unremarkable. Review of the MIP images confirms the above findings CTA NECK FINDINGS Aortic arch: Standard 3 vessel aortic  arch with widely patent arch vessel origins. Right carotid system: Patent with mild calcified plaque at the carotid bifurcation. No evidence of significant stenosis or dissection. Left carotid system: Patent with moderate calcified plaque at the carotid bifurcation. No evidence of significant stenosis or dissection. Tortuous distal cervical ICA. Vertebral arteries: Patent without evidence of significant stenosis or dissection. Moderately dominant left vertebral artery. Skeleton: Poor dentition with multiple dental caries and missing teeth. Other neck: No evidence of acute abnormality or mass. Upper chest: Emphysema. Review of the MIP images confirms the above findings CTA HEAD FINDINGS Anterior circulation: The internal carotid arteries are patent from skull base to carotid termini with nonstenotic plaque bilaterally. ACAs and MCAs are patent without evidence of proximal branch occlusion or significant proximal stenosis. No aneurysm is identified. Posterior circulation: The intracranial vertebral arteries are patent to the basilar. Patent PICA and SCA origins are identified bilaterally. The basilar artery is patent with a mild stenosis in its midportion. There are moderately large posterior communicating arteries bilaterally  with hypoplasia of the P1 segments. No flow limiting proximal PCA stenosis is evident. No aneurysm is identified. Venous sinuses: Patent. Anatomic variants: Predominantly fetal origin of the PCAs. Delayed phase: No abnormal enhancement. Review of the MIP images confirms the above findings IMPRESSION: 1. Intracranial atherosclerosis without major branch occlusion or flow limiting proximal stenosis. 2. Widely patent cervical carotid and vertebral arteries. 3. No evidence of acute infarct or hemorrhage on noncontrast head CT. 4.  Emphysema (ICD10-J43.9). Electronically Signed   By: Logan Bores M.D.   On: 03/09/2018 14:06   US Carotid Bilateral (at [facility] And [facility] Only)  Result Date: 03/09/2018 CLINICAL DATA:  CVA.  History of CAD, hyperlipidemia and smoking. EXAM: BILATERAL CAROTID DUPLEX ULTRASOUND TECHNIQUE: Pearline Cables scale imaging, color Doppler and duplex ultrasound were performed of bilateral carotid and vertebral arteries in the neck. COMPARISON:  None. FINDINGS: Criteria: Quantification of carotid stenosis is based on velocity parameters that correlate the residual internal carotid diameter with NASCET-based stenosis levels, using the diameter of the distal internal carotid lumen as the denominator for stenosis measurement. The following velocity measurements were obtained: RIGHT ICA: 112/29 cm/sec CCA: 76/54 cm/sec SYSTOLIC ICA/CCA RATIO:  1.1 ECA: 221 cm/sec LEFT ICA: 102/36 cm/sec CCA: 65/03 cm/sec SYSTOLIC ICA/CCA RATIO:  1.2 ECA: 104 cm/sec RIGHT CAROTID ARTERY: There is a moderate amount of eccentric echogenic plaque within the right carotid bulb (image 16), not resulting in elevated peak systolic velocities within the interrogated course the right internal carotid artery to suggest a hemodynamically significant stenosis. RIGHT VERTEBRAL ARTERY:  Antegrade Flow LEFT CAROTID ARTERY: There is a moderate to large amount of eccentric mixed echogenic plaque within the left carotid bulb (images 48 and 49), extending  to involve the origin and proximal aspects of the left internal carotid artery (image 56), not resulting in elevated peak systolic velocities within the interrogated course of the left internal carotid artery to suggest a hemodynamically significant stenosis. LEFT VERTEBRAL ARTERY:  Antegrade Flow Note is made of an apparent cardiac arrhythmia. IMPRESSION: 1. Moderate to large amount of bilateral atherosclerotic plaque, left greater than right, not resulting in a hemodynamically significant stenosis within either internal carotid artery. 2. Apparent cardiac arrhythmia. Further evaluation with ECG monitoring could be performed as clinically indicated. Electronically Signed   By: Sandi Mariscal M.D.   On: 03/09/2018 09:59    PERFORMANCE STATUS (ECOG) : 1 - Symptomatic but completely ambulatory  REVIEW OF SYSTEMS: As noted above. Otherwise, a complete review of  systems is negative.  PHYSICAL EXAM: General: NAD, frail appearing, thin Cardiovascular: regular rate and rhythm Pulmonary: clear ant fields Abdomen: soft, nontender, + bowel sounds GU: no suprapubic tenderness Extremities: no edema, no joint deformities Skin: no rashes Neurological: Weakness but otherwise nonfocal  IMPRESSION: Patient known to me from the hospital.  I met with him and his significant other today in the clinic.  I reintroduced palliative care services and attempted to establish therapeutic rapport.  Patient endorses acute on chronic pain.  Skeletal survey has been ordered by Dr. Rogue Bussing.  Patient has been on transdermal fentanyl 125 mcg every 72 hours and hydromorphone 2 mg every 4 hours as needed.  He is also on dexamethasone, gabapentin, and Cymbalta.  Patient's previous urine toxicology tested positive for cocaine.  Patient denies any recent illicit substance use other than THC.  Patient is deemed relatively high risk for opioid prescribing given history of substance abuse and family reports of misuse in the home.  We  will check comprehensive UDS today.  Further plan and recommendations to follow after toxicology results are reviewed.  Patient says that he has completed ACP documents including HC POA and living will but needs to bring them here for [place] to have on file.  He says he is appointed his "fiance" to be his [facility] POA.  Both say they have discussed CODE STATUS.  Patient would not want CPR in the event of cardiopulmonary arrest but would be okay with hospitalization and intubation for short-term if needed.  Patient says he would not want a tracheostomy or feeding tube or to be sustained long-term on a ventilator.   I completed a MOST form today. The patient and family outlined their wishes for the following treatment decisions:  Cardiopulmonary Resuscitation: Do Not Attempt Resuscitation (DNR/No CPR)  Medical Interventions: Full Scope of Treatment: Use intubation, advanced airway interventions, mechanical ventilation, cardioversion as indicated, medical treatment, IV fluids, etc, also provide comfort measures. Transfer to the hospital if indicated  Antibiotics: Antibiotics if indicated  IV Fluids: IV fluids if indicated  Feeding Tube: No feeding tube    PLAN: -Continue current scope of treatment -We will check comprehensive UDS -Limited Code -MOST form completed -RTC in 1 month   Patient expressed understanding and was in agreement with this plan. He also understands that He can call clinic at any time with any questions, concerns, or complaints.     Time Total: 30 minutes  Visit consisted of counseling and education dealing with the complex and emotionally intense issues of symptom management and palliative care in the setting of serious and potentially life-threatening illness.Greater than 50%  of this time was spent counseling and coordinating care related to the above assessment and plan.  Signed by: Altha Harm, PhD, NP-C [phone] (Work Cell)

## 2018-04-02 NOTE — Progress Notes (Signed)
[facility] OFFICE PROGRESS NOTE  Patient Care Team: Petra Kuba, MD as PCP - General (Family Medicine)  Cancer Staging No matching staging information was found for the patient.   Oncology History   # SEP 2015- MULTIPLE MYELOMA [multiple PET pos Bone lesions; hypercalcemia s/p BMBx; FISH- Aneuploidy - gain of chromosome 7,9,15,FGFR3/4p16.3, and CCND1/11q13. Loss of MAF/16q23.1), cytogenetics normal 46XY] s/p Vel-Dex-Rev-Zometa; Excellent PR;   # MARCH 2016- REV-DEX ; FEB 2017-[discn Dex] Rev 25 mg 3 w On & 1 w Off; March 2017- M- protein 0.4gm/dl; K/L= 7.9; cont Rev;   # Revlimid HELD June 2018 [sec to [facility]]  # AUG 2019- RECURRENCE- NINLARO-DEX  # March 2017-  Left Middle Lobe cavitary lesion- ~16m- repeat Ct in 3-479m# AUG 2017- [facility] [[name], [place] #  ? [facility]- [id number] not follow up ]- DISCONT Zometa.; NO HBO sec to   # Chronic pain/Anxiety [ortho]  # COPD/smoking/ [[facility]- not candidate for BMT- sec to co-morbidities/poor nutritional status];     ------------------------------------------------------------------  DIAGNOSIS: MULTIPLE MYELOMA  STAGE: RECURRENT  ;GOALS: CONTROL  CURRENT/MOST RECENT THERAPY; AUG 2019- NINLARO-DEX      Multiple myeloma in relapse ([facility]     INTERVAL HISTORY:  [name] [id number].[id number].  male pleasant patient above history of recurrent/relapse multiple myeloma-currently on Ninlaro is here for follow-up.  Patient has not had any hospitalizations in the last visit.  Patient has not had any mental status changes/worsening symptoms of stroke.   He continues to feel poorly.  Continues to complain of worsening joint pains back pain.  Continues pain Dilaudid/fentanyl patch and Xanax for anxiety.  Review of Systems  Constitutional: Positive for malaise/fatigue. Negative for chills, diaphoresis and fever.  HENT: Negative for nosebleeds and sore throat.   Eyes: Negative for double vision.  Respiratory: Positive for  cough, shortness of breath and wheezing. Negative for hemoptysis and sputum production.   Cardiovascular: Negative for chest pain, palpitations, orthopnea and leg swelling.  Gastrointestinal: Positive for nausea. Negative for abdominal pain, blood in stool, constipation, diarrhea, heartburn, melena and vomiting.  Genitourinary: Negative for dysuria, frequency and urgency.  Musculoskeletal: Positive for back pain (worse) and joint pain.  Skin: Negative.  Negative for itching and rash.  Neurological: Negative for dizziness, tingling, focal weakness, weakness and headaches.  Endo/Heme/Allergies: Does not bruise/bleed easily.  Psychiatric/Behavioral: Negative for depression. The patient is not nervous/anxious and does not have insomnia.       PAST MEDICAL HISTORY :  Past Medical History:  Diagnosis Date  . Anxiety   . Arthritis   . Atrial septal defect   . CHF (congestive heart failure) ([facility]  . COPD (chronic obstructive pulmonary disease) ([facility]  . Coronary artery disease   . Dysrhythmias   . GERD (gastroesophageal reflux disease)   . Hyperlipidemia   . Multiple myeloma ([facility]  . Multiple myeloma ([facility]CM[facility]  . Myocardial infarction ([facility]  . Substance abuse ([facility]    PAST SURGICAL HISTORY :   Past Surgical History:  Procedure Laterality Date  . CARDIAC DEFIBRILLATOR PLACEMENT      FAMILY HISTORY :   Family History  Problem Relation Age of Onset  . COPD Mother   . CAD Mother   . Heart attack Father   . Prostate cancer Maternal Grandfather   . Kidney cancer Neg Hx   . Bladder Cancer Neg Hx     SOCIAL HISTORY:   Social History   Tobacco  Use  . Smoking status: Current Every Day Smoker    Packs/day: 0.50    Years: 40.00    Pack years: 20.00    Types: Cigarettes  . Smokeless tobacco: Never Used  Substance Use Topics  . Alcohol use: No    Alcohol/week: 0.0 standard drinks  . Drug use: No    ALLERGIES:  has No Known Allergies.  MEDICATIONS:  Current Outpatient  Medications  Medication Sig Dispense Refill  . acetaminophen (TYLENOL) 325 MG tablet Take 2 tablets (650 mg total) by mouth every 6 (six) hours as needed for mild pain (or Fever >/= 101).    Marland Kitchen albuterol (PROVENTIL HFA;VENTOLIN HFA) 108 (90 Base) MCG/ACT inhaler Inhale 2 puffs into the lungs every 6 (six) hours as needed for wheezing or shortness of breath. 1 Inhaler 6  . ALPRAZolam (XANAX) 0.5 MG tablet Take 1 tablet (0.5 mg total) by mouth 3 (three) times daily as needed for anxiety. 90 tablet 1  . aspirin EC 81 MG EC tablet Take 1 tablet (81 mg total) by mouth daily.    Marland Kitchen atorvastatin (LIPITOR) 20 MG tablet Take 20 mg by mouth daily.  2  . clopidogrel (PLAVIX) 75 MG tablet Take 1 tablet (75 mg total) by mouth daily. 30 tablet 0  . dexamethasone (DECADRON) 4 MG tablet TAKE 3 TABLETS (12 MG TOTAL) BY MOUTH ONCE A WEEK. TAKE WITH BREAKFAST 12 tablet 4  . digoxin (LANOXIN) 0.25 MG tablet Take 1 tablet (0.25 mg total) by mouth daily. 30 tablet 0  . DULoxetine (CYMBALTA) 30 MG capsule Take 1 capsule (30 mg total) by mouth daily. 90 capsule 3  . fentaNYL (DURAGESIC) 100 MCG/HR Place 1 patch onto the skin every other day. Use with a 25 mcg patch for total dose of 125 mcg 15 patch 0  . fentaNYL (DURAGESIC) 25 MCG/HR Place 1 patch onto the skin every other day. Use with a 100 mcg patch for total dose of 125 mcg 15 patch 0  . Fluticasone-Salmeterol (ADVAIR DISKUS) 250-50 MCG/DOSE AEPB Inhale 1 puff into the lungs 2 (two) times daily. 3 each 2  . gabapentin (NEURONTIN) 100 MG capsule Take 2 capsules (200 mg total) by mouth 3 (three) times daily. 180 capsule 3  . HYDROmorphone (DILAUDID) 2 MG tablet Take 1 tablet (2 mg total) by mouth every 4 (four) hours as needed for severe pain. 15 tablet 0  . ipratropium-albuterol (DUONEB) 0.5-2.5 (3) MG/3ML SOLN Take 3 mLs by nebulization every 4 (four) hours as needed. 360 mL 3  . megestrol (MEGACE) 20 MG tablet TAKE ONE TABLET BY MOUTH ONCE DAILY 30 tablet 2  .  metoprolol tartrate (LOPRESSOR) 25 MG tablet Take 1 tablet (25 mg total) by mouth 2 (two) times daily. 60 tablet 0  . tiotropium (SPIRIVA HANDIHALER) 18 MCG inhalation capsule Place 1 capsule (18 mcg total) into inhaler and inhale daily. 30 capsule 6  . vitamin B-12 1000 MCG tablet Take 1 tablet (1,000 mcg total) by mouth daily. 30 tablet 0  . naloxone (NARCAN) nasal spray 4 mg/0.1 mL For Opioid Overdose: Call 911 and administer a single spray of Narcan in one nostril. Repeat every 87mns as needed if no or minimal response (Patient not taking: Reported on 03/19/2018) 1 kit 2  . NINLARO 4 MG capsule TAKE 1 CAPSULE (4 MG TOTAL) BY MOUTH ONCE A WEEK. TAKE FOR 3 WEEKS ON, THEN 1 WEEK OFF. TAKE ON EMPTY STOMACH 1HR BEFORE OR 2HRS AFTER FOOD. (Patient not taking: Reported on  04/02/2018) 3 capsule 4  . ondansetron (ZOFRAN) 8 MG tablet 1 pill every 8 hours as needed. 60 tablet 3  . tamsulosin (FLOMAX) 0.4 MG CAPS capsule Take 1 capsule (0.4 mg total) by mouth daily. (Patient not taking: Reported on 04/02/2018) 30 capsule 0   No current facility-administered medications for this visit.     PHYSICAL EXAMINATION: ECOG PERFORMANCE STATUS: 2 - Symptomatic, <50% confined to bed  BP 90/63   Pulse (!) 111   Temp 97.6 F (36.4 C) (Tympanic)   Resp 18   There were no vitals filed for this visit.  Physical Exam  Constitutional: He is oriented to person, place, and time.  Cachectic appearing Caucasian male patient; 2 L nasal cannula.  Accompanied by family.  He is walking by himself.  HENT:  Head: Normocephalic and atraumatic.  Mouth/Throat: Oropharynx is clear and moist. No oropharyngeal exudate.  Eyes: Pupils are equal, round, and reactive to light.  Neck: Normal range of motion. Neck supple.  Cardiovascular: Normal rate and regular rhythm.  Pulmonary/Chest: No respiratory distress. He has wheezes.  Bilateral decreased air entry.  Abdominal: Soft. Bowel sounds are normal. He exhibits no distension and no  mass. There is no abdominal tenderness. There is no rebound and no guarding.  Musculoskeletal: Normal range of motion.        General: No tenderness or edema.  Neurological: He is alert and oriented to person, place, and time.  Skin: Skin is warm.  Psychiatric: Affect normal.       LABORATORY DATA:  I have reviewed the data as listed    Component Value Date/Time   NA 138 04/02/2018 1036   NA 135 05/12/2014 1343   K 3.2 (L) 04/02/2018 1036   K 3.3 (L) 05/12/2014 1343   CL 103 04/02/2018 1036   CL 101 05/12/2014 1343   CO2 28 04/02/2018 1036   CO2 27 05/12/2014 1343   GLUCOSE 96 04/02/2018 1036   GLUCOSE 77 05/12/2014 1343   BUN 10 04/02/2018 1036   BUN 9 05/12/2014 1343   CREATININE 0.73 04/02/2018 1036   CREATININE 0.89 05/12/2014 1343   CALCIUM 8.8 (L) 04/02/2018 1036   CALCIUM 8.8 (L) 05/12/2014 1343   PROT 7.1 03/19/2018 1017   PROT 5.9 (L) 02/17/2014 1405   ALBUMIN 3.6 03/19/2018 1017   ALBUMIN 2.8 (L) 02/17/2014 1405   AST 15 03/19/2018 1017   AST 9 (L) 02/17/2014 1405   ALT 8 03/19/2018 1017   ALT 12 (L) 02/17/2014 1405   ALKPHOS 43 03/19/2018 1017   ALKPHOS 44 (L) 02/17/2014 1405   BILITOT 0.4 03/19/2018 1017   BILITOT 0.2 02/17/2014 1405   GFRNONAA >60 04/02/2018 1036   GFRNONAA >60 05/12/2014 1343   GFRAA >60 04/02/2018 1036   GFRAA >60 05/12/2014 1343    No results found for: SPEP, UPEP  Lab Results  Component Value Date   WBC 9.3 04/02/2018   NEUTROABS 6.2 04/02/2018   HGB 12.5 (L) 04/02/2018   HCT 39.2 04/02/2018   MCV 87.5 04/02/2018   PLT 182 04/02/2018      Chemistry      Component Value Date/Time   NA 138 04/02/2018 1036   NA 135 05/12/2014 1343   K 3.2 (L) 04/02/2018 1036   K 3.3 (L) 05/12/2014 1343   CL 103 04/02/2018 1036   CL 101 05/12/2014 1343   CO2 28 04/02/2018 1036   CO2 27 05/12/2014 1343   BUN 10 04/02/2018 1036   BUN 9  05/12/2014 1343   CREATININE 0.73 04/02/2018 1036   CREATININE 0.89 05/12/2014 1343       Component Value Date/Time   CALCIUM 8.8 (L) 04/02/2018 1036   CALCIUM 8.8 (L) 05/12/2014 1343   ALKPHOS 43 03/19/2018 1017   ALKPHOS 44 (L) 02/17/2014 1405   AST 15 03/19/2018 1017   AST 9 (L) 02/17/2014 1405   ALT 8 03/19/2018 1017   ALT 12 (L) 02/17/2014 1405   BILITOT 0.4 03/19/2018 1017   BILITOT 0.2 02/17/2014 1405       RADIOGRAPHIC STUDIES: I have personally reviewed the radiological images as listed and agreed with the findings in the report. No results found.   ASSESSMENT & PLAN:  Multiple myeloma in relapse ([place]) # MULTIPLE MYELOMA-AUG 2019-IgG kappa;M protein=0.6; kappa light chains 570/elevated. AUG 2019-  Bone survey shows progressive lesions in the bilateral humeri/femur. Oct 2019- PET- no lytic lesions noted.  Stable.  # Currently on Ninlaro-Dex [start again 12/11]- PR noted; FEB 2020-0.4 -Kappa light chains-slightly abnormal.  Continue Ninlaro at this time.  # ?acute stroke/ mental status changes- on anti-platelet therapy; stable.  #October 2019 PET scan Bil scattered lung nodules- up to 5 mm; will repeat imaging in April 2020.  Stable  # chronic pain/anxiety- worsening; on fentanyl [116mg] q 48 hours; and Dilaudid 2 mg every 4 hours [increased].  Patient initially-declined urine drug screen; stating unable to urinate.  Inform patient that he will not get pain medication without a drug screen.  Discussed with [name].  #Dental extractions recommended given patient's poor dentition.  History of osteonecrosis of the jaw.  # COPD COPD wheezing-STABLE.   # Chronic nausea-question secondary to Ninlaro.  Continue Zofran as needed. STABLE.   #Overall debility/borderline performance status-hospital bed/ wheelchair.  Will be sent again.  DISPOSITION:  # Urine drug screen today # follow up in 3 week - MD-labs-cbc/bmp/ MM panel; K-l light chains # follow up with [name] in 3 weeks-Dr.B  Addendum: While awaiting on patient drug screen patient given Dilaudid refill for 2  days.  Will await urine drug screen for further refills.   No orders of the defined types were placed in this encounter.  All questions were answered. The patient knows to call the clinic with any problems, questions or concerns.      GCammie Sickle MD 04/02/2018 9:34 PM

## 2018-04-02 NOTE — Assessment & Plan Note (Addendum)
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 AUG 2019-  Bone survey shows progressive lesions in the bilateral humeri/femur. Oct 2019- PET- no lytic lesions noted.  Stable.  # Currently on Ninlaro-Dex [start again 12/11]- PR noted; FEB 2020-0.4 -Kappa light chains-slightly abnormal.  Continue Ninlaro at this time.  # ?acute stroke/ mental status changes- on anti-platelet therapy; stable.  #October 2019 PET scan Bil scattered lung nodules- up to 5 mm; will repeat imaging in April 2020.  Stable  # chronic pain/anxiety- worsening; on fentanyl [17mg] q 48 hours; and Dilaudid 2 mg every 4 hours [increased].  Patient initially-declined urine drug screen; stating unable to urinate.  Inform patient that he will not get pain medication without a drug screen.  Discussed with [name].  #Dental extractions recommended given patient's poor dentition.  History of osteonecrosis of the jaw.  # COPD COPD wheezing-STABLE.   # Chronic nausea-question secondary to Ninlaro.  Continue Zofran as needed. STABLE.   #Overall debility/borderline performance status-hospital bed/ wheelchair.  Will be sent again.  DISPOSITION:  # Urine drug screen today # follow up in 3 week - MD-labs-cbc/bmp/ MM panel; K-l light chains # follow up with [name] in 3 weeks-Dr.B  Addendum: While awaiting on patient drug screen patient given Dilaudid refill for 2 days.  Will await urine drug screen for further refills.

## 2018-04-02 NOTE — Progress Notes (Signed)
Patient has adv. Directives at home that have now been notarized. He will bring these in at the next apt.

## 2018-04-02 NOTE — Telephone Encounter (Signed)
Patient informed that physician will send 2 days worth of medicine waiting for UDS to come back. Left message on voice mail

## 2018-04-03 ENCOUNTER — Telehealth: Payer: Self-pay | Admitting: Hospice and Palliative Medicine
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
## 2018-04-04 ENCOUNTER — Other Ambulatory Visit: Payer: Self-pay

## 2018-04-04 ENCOUNTER — Ambulatory Visit
Admission: RE | Admit: 2018-04-04 | Discharge: 2018-04-04 | Disposition: A | Discharge status: Home / Self Care | Payer: Medicaid Other | Source: Ambulatory Visit | Attending: Internal Medicine | Admitting: Internal Medicine

## 2018-04-04 DIAGNOSIS — C9002 Multiple myeloma in relapse: Secondary | ICD-10-CM | POA: Diagnosis present

## 2018-04-05 ENCOUNTER — Other Ambulatory Visit: Payer: Self-pay | Admitting: *Deleted

## 2018-04-05 DIAGNOSIS — C9000 Multiple myeloma not having achieved remission: Secondary | ICD-10-CM

## 2018-04-05 MED ORDER — HYDROMORPHONE HCL 2 MG PO TABS: 2.0000 mg | ORAL_TABLET | ORAL | 0 refills | Status: DC | PRN

## 2018-04-05 NOTE — Telephone Encounter (Signed)
[name] called asking if drug test is back yet stating he took his last dilaudid this morning. I check with lab who said it takes a week to get results back. Please advise if you will refill his hydromorphone today until Tuesday or Wednesday when the results should be back

## 2018-04-05 NOTE — Telephone Encounter (Signed)
Per VO Dr Rogue Bussing, refill for 6 days

## 2018-04-07 LAB — MISC LABCORP TEST (SEND OUT)
LabCorp test name: 791194
Labcorp test code: 791194

## 2018-04-08 MED FILL — DEXAMETHASONE 4 MG TABLET: 4 | 28 days supply | Qty: 12 | Fill #2

## 2018-04-08 MED FILL — NINLARO 4 MG CAP: 4 | 28 days supply | Qty: 3 | Fill #2

## 2018-04-10 ENCOUNTER — Telehealth: Payer: Self-pay | Admitting: *Deleted

## 2018-04-10 ENCOUNTER — Telehealth: Payer: Self-pay | Admitting: Internal Medicine

## 2018-04-10 NOTE — Telephone Encounter (Signed)
Patient accepts appointment for 1130 tomorrow morning

## 2018-04-10 NOTE — Telephone Encounter (Signed)
Patient returned call to office and also [name] called for refill of Dilaudid and Xanax

## 2018-04-10 NOTE — Telephone Encounter (Signed)
Per Dr. Jacinto Reap, he is holding prescriptions until patient's appt tomorrow.

## 2018-04-10 NOTE — Telephone Encounter (Signed)
Discussed with [name], PC. Will have pt come and discuss UDS/ pain management tomorrow. dix

## 2018-04-10 NOTE — Telephone Encounter (Signed)
I called patient back and he accepts 1130 appointment for tomorrow

## 2018-04-11 ENCOUNTER — Other Ambulatory Visit: Payer: Self-pay

## 2018-04-11 ENCOUNTER — Encounter: Payer: Self-pay | Admitting: Internal Medicine

## 2018-04-11 ENCOUNTER — Inpatient Hospital Stay (HOSPITAL_BASED_OUTPATIENT_CLINIC_OR_DEPARTMENT_OTHER): Payer: Medicaid Other | Admitting: Internal Medicine

## 2018-04-11 ENCOUNTER — Inpatient Hospital Stay (HOSPITAL_BASED_OUTPATIENT_CLINIC_OR_DEPARTMENT_OTHER): Payer: Medicaid Other | Admitting: Hospice and Palliative Medicine

## 2018-04-11 VITALS — BP 93/62 | HR 108 | Temp 96.9°F | Resp 18 | Ht 72.0 in | Wt 125.0 lb

## 2018-04-11 DIAGNOSIS — K219 Gastro-esophageal reflux disease without esophagitis: Secondary | ICD-10-CM

## 2018-04-11 DIAGNOSIS — Z9981 Dependence on supplemental oxygen: Secondary | ICD-10-CM

## 2018-04-11 DIAGNOSIS — Z515 Encounter for palliative care: Secondary | ICD-10-CM | POA: Diagnosis not present

## 2018-04-11 DIAGNOSIS — I252 Old myocardial infarction: Secondary | ICD-10-CM

## 2018-04-11 DIAGNOSIS — G8929 Other chronic pain: Secondary | ICD-10-CM | POA: Diagnosis not present

## 2018-04-11 DIAGNOSIS — R5381 Other malaise: Secondary | ICD-10-CM

## 2018-04-11 DIAGNOSIS — Z79899 Other long term (current) drug therapy: Secondary | ICD-10-CM

## 2018-04-11 DIAGNOSIS — Z7902 Long term (current) use of antithrombotics/antiplatelets: Secondary | ICD-10-CM

## 2018-04-11 DIAGNOSIS — I2510 Atherosclerotic heart disease of native coronary artery without angina pectoris: Secondary | ICD-10-CM

## 2018-04-11 DIAGNOSIS — J449 Chronic obstructive pulmonary disease, unspecified: Secondary | ICD-10-CM

## 2018-04-11 DIAGNOSIS — R5383 Other fatigue: Secondary | ICD-10-CM

## 2018-04-11 DIAGNOSIS — M1990 Unspecified osteoarthritis, unspecified site: Secondary | ICD-10-CM

## 2018-04-11 DIAGNOSIS — G893 Neoplasm related pain (acute) (chronic): Secondary | ICD-10-CM

## 2018-04-11 DIAGNOSIS — C9000 Multiple myeloma not having achieved remission: Secondary | ICD-10-CM

## 2018-04-11 DIAGNOSIS — Q211 Atrial septal defect: Secondary | ICD-10-CM

## 2018-04-11 DIAGNOSIS — E785 Hyperlipidemia, unspecified: Secondary | ICD-10-CM

## 2018-04-11 DIAGNOSIS — C9002 Multiple myeloma in relapse: Secondary | ICD-10-CM | POA: Diagnosis not present

## 2018-04-11 DIAGNOSIS — Z7982 Long term (current) use of aspirin: Secondary | ICD-10-CM

## 2018-04-11 DIAGNOSIS — Z95810 Presence of automatic (implantable) cardiac defibrillator: Secondary | ICD-10-CM

## 2018-04-11 DIAGNOSIS — F419 Anxiety disorder, unspecified: Secondary | ICD-10-CM

## 2018-04-11 DIAGNOSIS — F17210 Nicotine dependence, cigarettes, uncomplicated: Secondary | ICD-10-CM

## 2018-04-11 DIAGNOSIS — R110 Nausea: Secondary | ICD-10-CM

## 2018-04-11 DIAGNOSIS — I5040 Unspecified combined systolic (congestive) and diastolic (congestive) heart failure: Secondary | ICD-10-CM

## 2018-04-11 DIAGNOSIS — R05 Cough: Secondary | ICD-10-CM

## 2018-04-11 DIAGNOSIS — I509 Heart failure, unspecified: Secondary | ICD-10-CM

## 2018-04-11 DIAGNOSIS — M549 Dorsalgia, unspecified: Secondary | ICD-10-CM

## 2018-04-11 MED ORDER — FENTANYL 100 MCG/HR TD PT72: 1.0000 | MEDICATED_PATCH | TRANSDERMAL | 0 refills | Status: DC

## 2018-04-11 MED ORDER — FENTANYL 25 MCG/HR TD PT72: 1.0000 | MEDICATED_PATCH | TRANSDERMAL | 0 refills | Status: DC

## 2018-04-11 MED ORDER — ALPRAZOLAM 0.5 MG PO TABS: 0.5000 mg | ORAL_TABLET | Freq: Three times a day (TID) | ORAL | 0 refills | Status: DC | PRN

## 2018-04-11 NOTE — Progress Notes (Signed)
[name]  Telephone:([phone] [phone]   Name: [name] Date: 04/11/2018 MRN: [id number]  DOB: [birth year]  Patient Care Team: Petra Kuba, MD as PCP - General (Family Medicine)    REASON FOR CONSULTATION: Palliative Care consult requested for this 58 y.o. male with multiple medical problems including recurrent multiple myeloma with multiple PET positive bone lesions most recently treated on Ninlaro, O2 dependent COPD, diastolic dysfunction with history of CHF, atrial septal defect, and tobacco abuse.  Patient was admitted to the hospital on 03/08/2018 to 03/13/2018 under involuntary commitment for agitation and hallucinations.  Initially, patient was thought to have had an acute CVA on head CT but reimaging appeared consistent with artifact.  AMS was thought probably related to medications.  Patient has had chronic pain and anxiety on fentanyl and hydromorphone. Palliative care was consulted to help address goals and assist with symptom management.  SOCIAL HISTORY:    Patient is not married but lives at home with a significant other and several unrelated friends.  He has 2 sons and a daughter but reportedly is estranged from children.  ADVANCE DIRECTIVES:  Patient significant other is his healthcare power of attorney  CODE STATUS: Limited code (MOST form completed on 04/02/18)  PAST MEDICAL HISTORY: Past Medical History:  Diagnosis Date   Anxiety    Arthritis    Atrial septal defect    CHF (congestive heart failure) (HCC)    COPD (chronic obstructive pulmonary disease) ([place])    Coronary artery disease    Dysrhythmias    GERD (gastroesophageal reflux disease)    Hyperlipidemia    Multiple myeloma (HCC)    Multiple myeloma ([place])    Myocardial infarction ([place])    Substance abuse ([place])     PAST SURGICAL HISTORY:  Past Surgical History:  Procedure Laterality Date   CARDIAC DEFIBRILLATOR  PLACEMENT      HEMATOLOGY/ONCOLOGY HISTORY:  Oncology History   # SEP 2015- MULTIPLE MYELOMA [multiple PET pos Bone lesions; hypercalcemia s/p BMBx; FISH- Aneuploidy - gain of chromosome 7,9,15,FGFR3/4p16.3, and CCND1/11q13. Loss of MAF/16q23.1), cytogenetics normal 46XY] s/p Vel-Dex-Rev-Zometa; Excellent PR;   # MARCH 2016- REV-DEX ; FEB 2017-[discn Dex] Rev 25 mg 3 w On & 1 w Off; March 2017- M- protein 0.4gm/dl; K/L= 7.9; cont Rev;   # Revlimid HELD June 2018 [sec to ONJ]  # AUG 2019- RECURRENCE- NINLARO-DEX  # March 2017-  Left Middle Lobe cavitary lesion- ~22m- repeat Ct in 3-488m# AUG 2017- ONJ [[name], [place] #  ? ONJ- [phone] not follow up ]- DISCONT Zometa.; NO HBO sec to   # Chronic pain/Anxiety [ortho]  # COPD/smoking/ [[facility]- not candidate for BMT- sec to co-morbidities/poor nutritional status];     ------------------------------------------------------------------  DIAGNOSIS: MULTIPLE MYELOMA  STAGE: RECURRENT  ;GOALS: CONTROL  CURRENT/MOST RECENT THERAPY; AUG 2019- NINLARO-DEX      Multiple [place]oma in relapse ([facility]   ALLERGIES:  has No Known Allergies.  MEDICATIONS:  Current Outpatient Medications  Medication Sig Dispense Refill   acetaminophen (TYLENOL) 325 MG tablet Take 2 tablets (650 mg total) by mouth every 6 (six) hours as needed for mild pain (or Fever >/= 101).     albuterol (PROVENTIL HFA;VENTOLIN HFA) 108 (90 Base) MCG/ACT inhaler Inhale 2 puffs into the lungs every 6 (six) hours as needed for wheezing or shortness of breath. 1 Inhaler 6   ALPRAZolam (XANAX) 0.5 MG tablet Take 1 tablet (0.5 mg total) by  mouth 3 (three) times daily as needed for anxiety. 90 tablet 0   aspirin EC 81 MG EC tablet Take 1 tablet (81 mg total) by mouth daily.     atorvastatin (LIPITOR) 20 MG tablet Take 20 mg by mouth daily.  2   clopidogrel (PLAVIX) 75 MG tablet Take 1 tablet (75 mg total) by mouth daily. 30 tablet 0   dexamethasone (DECADRON) 4 MG  tablet TAKE 3 TABLETS (12 MG TOTAL) BY MOUTH ONCE A WEEK. TAKE WITH BREAKFAST 12 tablet 4   digoxin (LANOXIN) 0.25 MG tablet Take 1 tablet (0.25 mg total) by mouth daily. 30 tablet 0   DULoxetine (CYMBALTA) 30 MG capsule Take 1 capsule (30 mg total) by mouth daily. 90 capsule 3   fentaNYL (DURAGESIC) 100 MCG/HR Place 1 patch onto the skin every 3 (three) days. Use with a 25 mcg patch for total dose of 125 mcg 5 patch 0   fentaNYL (DURAGESIC) 25 MCG/HR Place 1 patch onto the skin every 3 (three) days. Use with a 100 mcg patch for total dose of 125 mcg 5 patch 0   Fluticasone-Salmeterol (ADVAIR DISKUS) 250-50 MCG/DOSE AEPB Inhale 1 puff into the lungs 2 (two) times daily. 3 each 2   gabapentin (NEURONTIN) 100 MG capsule Take 2 capsules (200 mg total) by mouth 3 (three) times daily. 180 capsule 3   HYDROmorphone (DILAUDID) 2 MG tablet Take 1 tablet (2 mg total) by mouth every 4 (four) hours as needed for severe pain. 24 tablet 0   ipratropium-albuterol (DUONEB) 0.5-2.5 (3) MG/3ML SOLN Take 3 mLs by nebulization every 4 (four) hours as needed. 360 mL 3   megestrol (MEGACE) 20 MG tablet TAKE ONE TABLET BY MOUTH ONCE DAILY 30 tablet 2   metoprolol tartrate (LOPRESSOR) 25 MG tablet Take 1 tablet (25 mg total) by mouth 2 (two) times daily. 60 tablet 0   naloxone (NARCAN) nasal spray 4 mg/0.1 mL For Opioid Overdose: Call 911 and administer a single spray of Narcan in one nostril. Repeat every 22mns as needed if no or minimal response (Patient not taking: Reported on 03/19/2018) 1 kit 2   NINLARO 4 MG capsule TAKE 1 CAPSULE (4 MG TOTAL) BY MOUTH ONCE A WEEK. TAKE FOR 3 WEEKS ON, THEN 1 WEEK OFF. TAKE ON EMPTY STOMACH 1HR BEFORE OR 2HRS AFTER FOOD. 3 capsule 4   ondansetron (ZOFRAN) 8 MG tablet 1 pill every 8 hours as needed. 60 tablet 3   tamsulosin (FLOMAX) 0.4 MG CAPS capsule Take 1 capsule (0.4 mg total) by mouth daily. 30 capsule 0   tiotropium (SPIRIVA HANDIHALER) 18 MCG inhalation capsule  Place 1 capsule (18 mcg total) into inhaler and inhale daily. 30 capsule 6   vitamin B-12 1000 MCG tablet Take 1 tablet (1,000 mcg total) by mouth daily. 30 tablet 0   No current facility-administered medications for this visit.     VITAL SIGNS: There were no vitals taken for this visit. There were no vitals filed for this visit.  Estimated body mass index is 16.95 kg/m as calculated from the following:   Height as of an earlier encounter on 04/11/18: 6' (1.829 m).   Weight as of an earlier encounter on 04/11/18: 125 lb (56.7 kg).  LABS: CBC:    Component Value Date/Time   WBC 9.3 04/02/2018 1036   HGB 12.5 (L) 04/02/2018 1036   HGB 10.6 (L) 05/12/2014 1343   HCT 39.2 04/02/2018 1036   HCT 30.9 (L) 05/12/2014 1343   PLT  182 04/02/2018 1036   PLT 258 05/12/2014 1343   MCV 87.5 04/02/2018 1036   MCV 92 05/12/2014 1343   NEUTROABS 6.2 04/02/2018 1036   NEUTROABS 3.6 05/12/2014 1343   LYMPHSABS 1.6 04/02/2018 1036   LYMPHSABS 1.2 05/12/2014 1343   MONOABS 1.1 (H) 04/02/2018 1036   MONOABS 0.7 05/12/2014 1343   EOSABS 0.4 04/02/2018 1036   EOSABS 0.2 05/12/2014 1343   BASOSABS 0.0 04/02/2018 1036   BASOSABS 0.1 05/12/2014 1343   Comprehensive Metabolic Panel:    Component Value Date/Time   NA 138 04/02/2018 1036   NA 135 05/12/2014 1343   K 3.2 (L) 04/02/2018 1036   K 3.3 (L) 05/12/2014 1343   CL 103 04/02/2018 1036   CL 101 05/12/2014 1343   CO2 28 04/02/2018 1036   CO2 27 05/12/2014 1343   BUN 10 04/02/2018 1036   BUN 9 05/12/2014 1343   CREATININE 0.73 04/02/2018 1036   CREATININE 0.89 05/12/2014 1343   GLUCOSE 96 04/02/2018 1036   GLUCOSE 77 05/12/2014 1343   CALCIUM 8.8 (L) 04/02/2018 1036   CALCIUM 8.8 (L) 05/12/2014 1343   AST 15 03/19/2018 1017   AST 9 (L) 02/17/2014 1405   ALT 8 03/19/2018 1017   ALT 12 (L) 02/17/2014 1405   ALKPHOS 43 03/19/2018 1017   ALKPHOS 44 (L) 02/17/2014 1405   BILITOT 0.4 03/19/2018 1017   BILITOT 0.2 02/17/2014 1405   PROT  7.1 03/19/2018 1017   PROT 5.9 (L) 02/17/2014 1405   ALBUMIN 3.6 03/19/2018 1017   ALBUMIN 2.8 (L) 02/17/2014 1405    RADIOGRAPHIC STUDIES: Dg Bone Survey Met  Result Date: 04/05/2018 CLINICAL DATA:  Multiple myeloma. EXAM: METASTATIC BONE SURVEY COMPARISON:  PET-CT 11/05/2017.  Bone survey 09/17/2017. FINDINGS: Multiple lucencies again noted throughout the skull. Similar findings noted on prior exam. Diffuse cervicothoracic and thoracolumbar spine osteopenia and degenerative change. Stable T6, T12, L1, L2 compression fractures. Multiple lucencies are again noted about the proximal humeri. Similar findings noted on prior exam. Stable lucencies noted about the proximal femurs bilaterally. Stable nodularity noted over the right upper chest. Cardiac pacer again noted. Carotid vascular calcification again noted. IMPRESSION: Stable diffuse changes consistent with the patient's known multiple myeloma. No new lesions identified. Electronically Signed   By: Marcello Moores  Register   On: 04/05/2018 06:06    PERFORMANCE STATUS (ECOG) : 1 - Symptomatic but completely ambulatory  REVIEW OF SYSTEMS: As noted above. Otherwise, a complete review of systems is negative.  PHYSICAL EXAM: full exam deferred General: NAD, frail appearing, thin Extremities: no edema Skin: no rashes Neurological: Weakness but otherwise nonfocal  IMPRESSION: I met with patient and significant other today in the clinic to follow-up on results of UDS toxicology.  Note that I have reviewed the results independently with the toxicologist for [place]. I have also talked to [facility], the lab that processed the results. I have also discussed patient's case and plan with Dr. Rogue Bussing at length.  I discussed with patient my concerns that he was positive for oxycodone metabolites but hydromorphone was not present on UDS.  Patient seemed somewhat incredulous but then explained that it might have been possible for him to have mistakenly taken his  girlfriend's Percocet.  He said said that hydromorphone and oxycodone look identical. He then claimed that he believes the APS worker stole his fentanyl patches.  Patient showed me a 100 mcg fentanyl patch dated today (3/19) that was applied to his left thigh but could not locate the  25 mcg patch.  We discussed the option of referral to a pain clinic either in [place] or [place] to assume management of his pain medications. I explained that we would not be prescribing the hydromorphone here as it was not present on his UDS. If patient opted to forgo pain clinic referral, we discussed the option of [place] continuing to prescribe the fentanyl (as it was present on his UDS) but that we would reduce the frequency to one patch every three days. Patient would also be subject to numbered patches and patch counts.   I also offered referral to [facility] for addiction management but patient refused.   After some deliberation, patient requested transfer to a pain clinic. Will send a referral to [facility] in [place]: -Continue current scope of treatment -Referral to [facility] clinic in [place] in 1 month   Patient expressed understanding and was in agreement with this plan. He also understands that He can call clinic at any time with any questions, concerns, or complaints.     Time Total: 30 minutes  Visit consisted of counseling and education dealing with the complex and emotionally intense issues of symptom management and palliative care in the setting of serious and potentially life-threatening illness.Greater than 50%  of this time was spent counseling and coordinating care related to the above assessment and plan.  Signed by: Altha Harm, PhD, NP-C [phone] (Work Cell)

## 2018-04-11 NOTE — Progress Notes (Signed)
[name]  Patient Care Team: Petra Kuba, MD as PCP - General (Family Medicine)  Cancer Staging No matching staging information was found for the patient.   Oncology History   # SEP 2015- MULTIPLE MYELOMA [multiple PET pos Bone lesions; hypercalcemia s/p BMBx; FISH- Aneuploidy - gain of chromosome 7,9,15,FGFR3/4p16.3, and CCND1/11q13. Loss of MAF/16q23.1), cytogenetics normal 46XY] s/p Vel-Dex-Rev-Zometa; Excellent PR;   # MARCH 2016- REV-DEX ; FEB 2017-[discn Dex] Rev 25 mg 3 w On & 1 w Off; March 2017- M- protein 0.4gm/dl; K/L= 7.9; cont Rev;   # Revlimid HELD June 2018 [sec to ONJ]  # AUG 2019- RECURRENCE- NINLARO-DEX  # March 2017-  Left Middle Lobe cavitary lesion- ~18m- repeat Ct in 3-436m# AUG 2017- ONJ [[name], [place] #  ? ONJ- [phone] not follow up ]- DISCONT Zometa.; NO HBO sec to   # Chronic pain/Anxiety [ortho]  # COPD/smoking/ [[facility]- not candidate for BMT- sec to co-morbidities/poor nutritional status];    # MARCH 2020-PAIN CONTRACT: BREACH-  DECLINE ANY NARCOTICS/BENZO  ---------------------------------------------------------------  DIAGNOSIS: MULTIPLE MYELOMA  STAGE: RECURRENT  ;GOALS: CONTROL  CURRENT/MOST RECENT THERAPY; AUG 2019- NINLARO-DEX      Multiple [facility]oma in relapse ([facility]     INTERVAL HISTORY:  [name] 89.o.  male pleasant patient above history of recurrent/relapse multiple myeloma-currently on Ninlaro is here for follow-up/review results of his skeletal survey and discussed regarding pain medication refills.  Patient has not had any hospitalizations.  He has not had any repeat strokes or any COPD exacerbation.  Continues to have chronic nausea chronic shortness of breath.  Continues to have chronic fatigue.  He continues to feel poorly.  Continues to complain of worsening joint pains back pain.  Patient has run out of fentanyl patches/Dilaudid.  Review of Systems   Constitutional: Positive for malaise/fatigue. Negative for chills, diaphoresis and fever.  HENT: Negative for nosebleeds and sore throat.   Eyes: Negative for double vision.  Respiratory: Positive for cough, shortness of breath and wheezing. Negative for hemoptysis and sputum production.   Cardiovascular: Negative for chest pain, palpitations, orthopnea and leg swelling.  Gastrointestinal: Positive for nausea. Negative for abdominal pain, blood in stool, constipation, diarrhea, heartburn, melena and vomiting.  Genitourinary: Negative for dysuria, frequency and urgency.  Musculoskeletal: Positive for back pain (worse) and joint pain.  Skin: Negative.  Negative for itching and rash.  Neurological: Negative for dizziness, tingling, focal weakness, weakness and headaches.  Endo/Heme/Allergies: Does not bruise/bleed easily.  Psychiatric/Behavioral: Negative for depression. The patient is not nervous/anxious and does not have insomnia.       PAST MEDICAL HISTORY :  Past Medical History:  Diagnosis Date  . Anxiety   . Arthritis   . Atrial septal defect   . CHF (congestive heart failure) ([facility]  . COPD (chronic obstructive pulmonary disease) ([facility]  . Coronary artery disease   . Dysrhythmias   . GERD (gastroesophageal reflux disease)   . Hyperlipidemia   . Multiple myeloma ([facility]  . Multiple myeloma ([facility]  . Myocardial infarction ([facility]  . Substance abuse ([facility]    PAST SURGICAL HISTORY :   Past Surgical History:  Procedure Laterality Date  . CARDIAC DEFIBRILLATOR PLACEMENT      FAMILY HISTORY :   Family History  Problem Relation Age of Onset  . COPD Mother   . CAD Mother   . Heart attack Father   . Prostate  cancer Maternal Grandfather   . Kidney cancer Neg Hx   . Bladder Cancer Neg Hx     SOCIAL HISTORY:   Social History   Tobacco Use  . Smoking status: Current Every Day Smoker    Packs/day: 0.50    Years: 40.00    Pack years: 20.00    Types: Cigarettes  .  Smokeless tobacco: Never Used  Substance Use Topics  . Alcohol use: No    Alcohol/week: 0.0 standard drinks  . Drug use: No    ALLERGIES:  has No Known Allergies.  MEDICATIONS:  Current Outpatient Medications  Medication Sig Dispense Refill  . acetaminophen (TYLENOL) 325 MG tablet Take 2 tablets (650 mg total) by mouth every 6 (six) hours as needed for mild pain (or Fever >/= 101).    Marland Kitchen albuterol (PROVENTIL HFA;VENTOLIN HFA) 108 (90 Base) MCG/ACT inhaler Inhale 2 puffs into the lungs every 6 (six) hours as needed for wheezing or shortness of breath. 1 Inhaler 6  . ALPRAZolam (XANAX) 0.5 MG tablet Take 1 tablet (0.5 mg total) by mouth 3 (three) times daily as needed for anxiety. 90 tablet 0  . aspirin EC 81 MG EC tablet Take 1 tablet (81 mg total) by mouth daily.    Marland Kitchen atorvastatin (LIPITOR) 20 MG tablet Take 20 mg by mouth daily.  2  . clopidogrel (PLAVIX) 75 MG tablet Take 1 tablet (75 mg total) by mouth daily. 30 tablet 0  . dexamethasone (DECADRON) 4 MG tablet TAKE 3 TABLETS (12 MG TOTAL) BY MOUTH ONCE A WEEK. TAKE WITH BREAKFAST 12 tablet 4  . digoxin (LANOXIN) 0.25 MG tablet Take 1 tablet (0.25 mg total) by mouth daily. 30 tablet 0  . DULoxetine (CYMBALTA) 30 MG capsule Take 1 capsule (30 mg total) by mouth daily. 90 capsule 3  . fentaNYL (DURAGESIC) 100 MCG/HR Place 1 patch onto the skin every 3 (three) days. Use with a 25 mcg patch for total dose of 125 mcg 5 patch 0  . fentaNYL (DURAGESIC) 25 MCG/HR Place 1 patch onto the skin every 3 (three) days. Use with a 100 mcg patch for total dose of 125 mcg 5 patch 0  . Fluticasone-Salmeterol (ADVAIR DISKUS) 250-50 MCG/DOSE AEPB Inhale 1 puff into the lungs 2 (two) times daily. 3 each 2  . gabapentin (NEURONTIN) 100 MG capsule Take 2 capsules (200 mg total) by mouth 3 (three) times daily. 180 capsule 3  . HYDROmorphone (DILAUDID) 2 MG tablet Take 1 tablet (2 mg total) by mouth every 4 (four) hours as needed for severe pain. 24 tablet 0  .  ipratropium-albuterol (DUONEB) 0.5-2.5 (3) MG/3ML SOLN Take 3 mLs by nebulization every 4 (four) hours as needed. 360 mL 3  . megestrol (MEGACE) 20 MG tablet TAKE ONE TABLET BY MOUTH ONCE DAILY 30 tablet 2  . metoprolol tartrate (LOPRESSOR) 25 MG tablet Take 1 tablet (25 mg total) by mouth 2 (two) times daily. 60 tablet 0  . NINLARO 4 MG capsule TAKE 1 CAPSULE (4 MG TOTAL) BY MOUTH ONCE A WEEK. TAKE FOR 3 WEEKS ON, THEN 1 WEEK OFF. TAKE ON EMPTY STOMACH 1HR BEFORE OR 2HRS AFTER FOOD. 3 capsule 4  . ondansetron (ZOFRAN) 8 MG tablet 1 pill every 8 hours as needed. 60 tablet 3  . tamsulosin (FLOMAX) 0.4 MG CAPS capsule Take 1 capsule (0.4 mg total) by mouth daily. 30 capsule 0  . tiotropium (SPIRIVA HANDIHALER) 18 MCG inhalation capsule Place 1 capsule (18 mcg total) into inhaler and  inhale daily. 30 capsule 6  . vitamin B-12 1000 MCG tablet Take 1 tablet (1,000 mcg total) by mouth daily. 30 tablet 0  . naloxone (NARCAN) nasal spray 4 mg/0.1 mL For Opioid Overdose: Call 911 and administer a single spray of Narcan in one nostril. Repeat every 42mns as needed if no or minimal response (Patient not taking: Reported on 03/19/2018) 1 kit 2   No current facility-administered medications for this visit.     PHYSICAL EXAMINATION: ECOG PERFORMANCE STATUS: 2 - Symptomatic, <50% confined to bed  BP 93/62   Pulse (!) 108   Temp (!) 96.9 F (36.1 C) (Tympanic)   Resp 18   Ht 6' (1.829 m)   Wt 125 lb (56.7 kg)   BMI 16.95 kg/m   Filed Weights   04/11/18 1137  Weight: 125 lb (56.7 kg)    Physical Exam  Constitutional: He is oriented to person, place, and time.  Cachectic appearing Caucasian male patient; 2 L nasal cannula.  Accompanied by family.  He is walking by himself.  HENT:  Head: Normocephalic and atraumatic.  Mouth/Throat: Oropharynx is clear and moist. No oropharyngeal exudate.  Eyes: Pupils are equal, round, and reactive to light.  Neck: Normal range of motion. Neck supple.   Cardiovascular: Normal rate and regular rhythm.  Pulmonary/Chest: No respiratory distress. He has wheezes.  Bilateral decreased air entry.  Abdominal: Soft. Bowel sounds are normal. He exhibits no distension and no mass. There is no abdominal tenderness. There is no rebound and no guarding.  Musculoskeletal: Normal range of motion.        General: No tenderness or edema.  Neurological: He is alert and oriented to person, place, and time.  Skin: Skin is warm.  Psychiatric: Affect normal.       LABORATORY DATA:  I have reviewed the data as listed    Component Value Date/Time   NA 138 04/02/2018 1036   NA 135 05/12/2014 1343   K 3.2 (L) 04/02/2018 1036   K 3.3 (L) 05/12/2014 1343   CL 103 04/02/2018 1036   CL 101 05/12/2014 1343   CO2 28 04/02/2018 1036   CO2 27 05/12/2014 1343   GLUCOSE 96 04/02/2018 1036   GLUCOSE 77 05/12/2014 1343   BUN 10 04/02/2018 1036   BUN 9 05/12/2014 1343   CREATININE 0.73 04/02/2018 1036   CREATININE 0.89 05/12/2014 1343   CALCIUM 8.8 (L) 04/02/2018 1036   CALCIUM 8.8 (L) 05/12/2014 1343   PROT 7.1 03/19/2018 1017   PROT 5.9 (L) 02/17/2014 1405   ALBUMIN 3.6 03/19/2018 1017   ALBUMIN 2.8 (L) 02/17/2014 1405   AST 15 03/19/2018 1017   AST 9 (L) 02/17/2014 1405   ALT 8 03/19/2018 1017   ALT 12 (L) 02/17/2014 1405   ALKPHOS 43 03/19/2018 1017   ALKPHOS 44 (L) 02/17/2014 1405   BILITOT 0.4 03/19/2018 1017   BILITOT 0.2 02/17/2014 1405   GFRNONAA >60 04/02/2018 1036   GFRNONAA >60 05/12/2014 1343   GFRAA >60 04/02/2018 1036   GFRAA >60 05/12/2014 1343    No results found for: SPEP, UPEP  Lab Results  Component Value Date   WBC 9.3 04/02/2018   NEUTROABS 6.2 04/02/2018   HGB 12.5 (L) 04/02/2018   HCT 39.2 04/02/2018   MCV 87.5 04/02/2018   PLT 182 04/02/2018      Chemistry      Component Value Date/Time   NA 138 04/02/2018 1036   NA 135 05/12/2014 1343   K  3.2 (L) 04/02/2018 1036   K 3.3 (L) 05/12/2014 1343   CL 103  04/02/2018 1036   CL 101 05/12/2014 1343   CO2 28 04/02/2018 1036   CO2 27 05/12/2014 1343   BUN 10 04/02/2018 1036   BUN 9 05/12/2014 1343   CREATININE 0.73 04/02/2018 1036   CREATININE 0.89 05/12/2014 1343      Component Value Date/Time   CALCIUM 8.8 (L) 04/02/2018 1036   CALCIUM 8.8 (L) 05/12/2014 1343   ALKPHOS 43 03/19/2018 1017   ALKPHOS 44 (L) 02/17/2014 1405   AST 15 03/19/2018 1017   AST 9 (L) 02/17/2014 1405   ALT 8 03/19/2018 1017   ALT 12 (L) 02/17/2014 1405   BILITOT 0.4 03/19/2018 1017   BILITOT 0.2 02/17/2014 1405       RADIOGRAPHIC STUDIES: I have personally reviewed the radiological images as listed and agreed with the findings in the report. No results found.   ASSESSMENT & PLAN:  Multiple myeloma in relapse ([place]) # MULTIPLE MYELOMA-AUG 2019-IgG kappa;M protein=0.6; kappa light chains 570/elevated. AUG 2019-  Bone survey shows progressive lesions in the bilateral humeri/femur. Oct 2019- PET- no lytic lesions noted.  Skeletal survey March 2020 stable.  No new lesions. STABLE.   # Currently on Ninlaro-Dex [start again 12/11]- PR noted; MARCH  2020-0.4 -Kappa light chains-slightly abnormal.  Continue Ninlaro at this time.   # ?acute stroke/ mental status changes- on anti-platelet therapy; stable.   #October 2019 PET scan Bil scattered lung nodules- up to 5 mm; will repeat imaging in May 2020. STABLE.   #Dental extractions recommended given patient's poor dentition.  History of osteonecrosis of the jaw.  # COPD wheezing- stable.  # Chronic nausea-question secondary to Ninlaro.  Continue Zofran as needed. Stable.   #Chronic pain-Long discussion with the patient/family regarding-the discrepant urine drug screen [patient has metabolites of oxycodone; and not of Dilaudid].  Patient has been prescribed Dilaudid not oxycodone.  Significant concerns about diversion of narcotics.  Discussed that I would not r refill any of patient's narcotic/or benzodiazepine  prescriptions.  Will recommend evaluation at pain clinic.  Patient family agreement.  Discussed with [name].  Also discussed with adult protective services.  # DISPOSITION: # pain clinic referral #Keep appt as planned/[name] appt the same day- dr.B   No orders of the defined types were placed in this encounter.  All questions were answered. The patient knows to call the clinic with any problems, questions or concerns.      Cammie Sickle, MD 04/15/2018 12:56 PM

## 2018-04-11 NOTE — Assessment & Plan Note (Addendum)
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 AUG 2019-  Bone survey shows progressive lesions in the bilateral humeri/femur. Oct 2019- PET- no lytic lesions noted.  Skeletal survey March 2020 stable.  No new lesions. STABLE.   # Currently on Ninlaro-Dex [start again 12/11]- PR noted; MARCH  2020-0.4 -Kappa light chains-slightly abnormal.  Continue Ninlaro at this time.   # ?acute stroke/ mental status changes- on anti-platelet therapy; stable.   #October 2019 PET scan Bil scattered lung nodules- up to 5 mm; will repeat imaging in May 2020. STABLE.   #Dental extractions recommended given patient's poor dentition.  History of osteonecrosis of the jaw.  # COPD wheezing- stable.  # Chronic nausea-question secondary to Ninlaro.  Continue Zofran as needed. Stable.   #Chronic pain-Long discussion with the patient/family regarding-the discrepant urine drug screen [patient has metabolites of oxycodone; and not of Dilaudid].  Patient has been prescribed Dilaudid not oxycodone.  Significant concerns about diversion of narcotics.  Discussed that I would not r refill any of patient's narcotic/or benzodiazepine prescriptions.  Will recommend evaluation at pain clinic.  Patient family agreement.  Discussed with [name] borders.  Also discussed with adult protective services.  # DISPOSITION: # pain clinic referral #Keep appt as planned/[name] appt the same day- dr.B 

## 2018-04-15 ENCOUNTER — Other Ambulatory Visit: Payer: Self-pay | Admitting: *Deleted

## 2018-04-15 DIAGNOSIS — C9000 Multiple myeloma not having achieved remission: Secondary | ICD-10-CM

## 2018-04-15 DIAGNOSIS — F419 Anxiety disorder, unspecified: Secondary | ICD-10-CM

## 2018-04-16 ENCOUNTER — Telehealth: Payer: Self-pay | Admitting: Hospice and Palliative Medicine

## 2018-04-16 NOTE — Telephone Encounter (Signed)
I spoke with [facility]. Patient is scheduled to be seen on 04/19/18 at 1030 in the [facility] office.

## 2018-04-23 ENCOUNTER — Inpatient Hospital Stay: Payer: Medicaid Other | Admitting: Internal Medicine

## 2018-04-23 ENCOUNTER — Encounter: Payer: Medicaid Other | Admitting: Hospice and Palliative Medicine

## 2018-04-23 ENCOUNTER — Inpatient Hospital Stay: Payer: Medicaid Other

## 2018-05-01 MED FILL — DEXAMETHASONE 4 MG TABLET: 4 | 28 days supply | Qty: 12 | Fill #3

## 2018-05-01 MED FILL — NINLARO 4 MG CAP: 4 | 28 days supply | Qty: 3 | Fill #3

## 2018-05-03 ENCOUNTER — Telehealth: Payer: Self-pay | Admitting: Hospice and Palliative Medicine

## 2018-05-03 NOTE — Telephone Encounter (Signed)
I spoke with Dr. Sula Soda, with [id number] pain clinic. [phone]  Dr. Sula Soda expressed concerns with patient's UDS in their clinic. Apparently, pt was not using fentanyl appropriately and had an inappropriately negative UDS. He also had some difficulty with obtaining the urine sample. There is concern for active diversion in the home.   Dr. Sula Soda is recommending consideration for an intrathecal pain pump and would like to speak with Dr. Rogue Bussing regarding pt's prognosis.   Case discussed with Dr. Rogue Bussing.

## 2018-05-06 ENCOUNTER — Telehealth: Payer: Self-pay | Admitting: Internal Medicine

## 2018-05-06 NOTE — Telephone Encounter (Signed)
  Spoke to Dr. Lina Sayre clinic.  Given the concerns for narcotic evaluation/and at the same time given the patient's pain needing narcotic pain medication-I think it is reasonable to proceed with intrathecal pain pump.  Pain physician will proceed with a trial in his office.  And if patient is a candidate for intrathecal pain pump-make a referral for same.  Patient will need clearance from his PCP.  [name]-please reach out to PCP regarding clearance for the intrathecal pump.  Thank you, [name] for helping me out with this challenging patient's situation.

## 2018-05-07 ENCOUNTER — Emergency Department: Payer: Medicaid Other

## 2018-05-07 ENCOUNTER — Other Ambulatory Visit: Payer: Self-pay

## 2018-05-07 ENCOUNTER — Emergency Department
Admission: EM | Admit: 2018-05-07 | Discharge: 2018-05-07 | Disposition: A | Discharge status: Home / Self Care | Payer: Medicaid Other | Attending: Emergency Medicine | Admitting: Emergency Medicine

## 2018-05-07 ENCOUNTER — Telehealth: Payer: Self-pay | Admitting: Hospice and Palliative Medicine

## 2018-05-07 DIAGNOSIS — R531 Weakness: Secondary | ICD-10-CM

## 2018-05-07 DIAGNOSIS — F17210 Nicotine dependence, cigarettes, uncomplicated: Secondary | ICD-10-CM | POA: Insufficient documentation

## 2018-05-07 DIAGNOSIS — R42 Dizziness and giddiness: Secondary | ICD-10-CM | POA: Insufficient documentation

## 2018-05-07 DIAGNOSIS — Z79899 Other long term (current) drug therapy: Secondary | ICD-10-CM | POA: Insufficient documentation

## 2018-05-07 DIAGNOSIS — Z7982 Long term (current) use of aspirin: Secondary | ICD-10-CM | POA: Diagnosis not present

## 2018-05-07 DIAGNOSIS — R197 Diarrhea, unspecified: Secondary | ICD-10-CM | POA: Insufficient documentation

## 2018-05-07 DIAGNOSIS — J449 Chronic obstructive pulmonary disease, unspecified: Secondary | ICD-10-CM | POA: Diagnosis not present

## 2018-05-07 DIAGNOSIS — I509 Heart failure, unspecified: Secondary | ICD-10-CM | POA: Insufficient documentation

## 2018-05-07 DIAGNOSIS — I2510 Atherosclerotic heart disease of native coronary artery without angina pectoris: Secondary | ICD-10-CM | POA: Diagnosis not present

## 2018-05-07 DIAGNOSIS — S5002XA Contusion of left elbow, initial encounter: Secondary | ICD-10-CM

## 2018-05-07 LAB — LACTIC ACID, PLASMA: Lactic Acid, Venous: 1.3 mmol/L (ref 0.5–1.9)

## 2018-05-07 LAB — CBC WITH DIFFERENTIAL/PLATELET
Abs Immature Granulocytes: 0.04 10*3/uL (ref 0.00–0.07)
Basophils Absolute: 0 10*3/uL (ref 0.0–0.1)
Basophils Relative: 0 %
Eosinophils Absolute: 0.1 10*3/uL (ref 0.0–0.5)
Eosinophils Relative: 2 %
HCT: 38.2 % — ABNORMAL LOW (ref 39.0–52.0)
Hemoglobin: 12.5 g/dL — ABNORMAL LOW (ref 13.0–17.0)
Immature Granulocytes: 1 %
Lymphocytes Relative: 17 %
Lymphs Abs: 1.4 10*3/uL (ref 0.7–4.0)
MCH: 28.1 pg (ref 26.0–34.0)
MCHC: 32.7 g/dL (ref 30.0–36.0)
MCV: 85.8 fL (ref 80.0–100.0)
Monocytes Absolute: 0.7 10*3/uL (ref 0.1–1.0)
Monocytes Relative: 9 %
Neutro Abs: 5.9 10*3/uL (ref 1.7–7.7)
Neutrophils Relative %: 71 %
Platelets: 253 10*3/uL (ref 150–400)
RBC: 4.45 MIL/uL (ref 4.22–5.81)
RDW: 14.6 % (ref 11.5–15.5)
WBC: 8.2 10*3/uL (ref 4.0–10.5)
nRBC: 0 % (ref 0.0–0.2)

## 2018-05-07 LAB — COMPREHENSIVE METABOLIC PANEL
ALT: 7 U/L (ref 0–44)
AST: 12 U/L — ABNORMAL LOW (ref 15–41)
Albumin: 3.8 g/dL (ref 3.5–5.0)
Alkaline Phosphatase: 29 U/L — ABNORMAL LOW (ref 38–126)
Anion gap: 9 (ref 5–15)
BUN: 12 mg/dL (ref 6–20)
CO2: 25 mmol/L (ref 22–32)
Calcium: 9.1 mg/dL (ref 8.9–10.3)
Chloride: 106 mmol/L (ref 98–111)
Creatinine, Ser: 0.72 mg/dL (ref 0.61–1.24)
GFR calc Af Amer: >60 mL/min (ref >60)
GFR calc non Af Amer: >60 mL/min (ref >60)
Glucose, Bld: 120 mg/dL — ABNORMAL HIGH (ref 70–99)
Potassium: 3 mmol/L — ABNORMAL LOW (ref 3.5–5.1)
Sodium: 140 mmol/L (ref 135–145)
Total Bilirubin: 0.4 mg/dL (ref 0.3–1.2)
Total Protein: 6.5 g/dL (ref 6.5–8.1)

## 2018-05-07 LAB — URINALYSIS, COMPLETE (UACMP) WITH MICROSCOPIC
Bacteria, UA: NONE SEEN
Bilirubin Urine: NEGATIVE
Glucose, UA: NEGATIVE mg/dL
Hgb urine dipstick: NEGATIVE
Ketones, ur: NEGATIVE mg/dL
Leukocytes,Ua: NEGATIVE
Nitrite: NEGATIVE
Protein, ur: NEGATIVE mg/dL
Specific Gravity, Urine: 1.015 (ref 1.005–1.030)
Squamous Epithelial / HPF: NONE SEEN (ref 0–5)
pH: 7 (ref 5.0–8.0)

## 2018-05-07 LAB — TROPONIN I: Troponin I: <0.03 ng/mL (ref <0.03)

## 2018-05-07 MED ORDER — SODIUM CHLORIDE 0.9 % IV BOLUS
1000.0000 mL | Freq: Once | INTRAVENOUS | Status: AC
  Administered 2018-05-07: 1000 mL via INTRAVENOUS

## 2018-05-07 NOTE — ED Provider Notes (Addendum)
[facility] Emergency Department Provider Note ____________________________________________   First MD Initiated Contact with Patient 05/07/18 1726     (approximate)  I have reviewed the triage vital signs and the nursing notes.   HISTORY  Chief Complaint Weakness and Dizziness    HPI [name] is a 57 y.o. male with PMH as noted below who presents with multiple complaints.  Primarily he reports generalized weakness that has been gradual over the last few days.  It is associated with lightheadedness and some diarrhea.  He denies associated vomiting or fever.  He has no shortness of breath.  In addition he has worsening peripheral neuropathy pain to his upper and lower extremities.  Past Medical History:  Diagnosis Date  . Anxiety   . Arthritis   . Atrial septal defect   . CHF (congestive heart failure) ([place])   . COPD (chronic obstructive pulmonary disease) ([place])   . Coronary artery disease   . Dysrhythmias   . GERD (gastroesophageal reflux disease)   . Hyperlipidemia   . Multiple myeloma ([place])   . Multiple myeloma ([place])   . Myocardial infarction ([place])   . Substance abuse [facility])     Patient Active Problem List   Diagnosis Date Noted  . Palliative care encounter   . Altered mental status, unspecified 03/08/2018  . CVA (cerebral vascular accident) ([place]) 03/08/2018  . Gastroenteritis 02/15/2018  . Fatigue due to treatment 09/07/2016  . Palliative care by specialist   . Advance care planning   . Other insomnia   . Protein-calorie malnutrition, severe 05/25/2016  . Aspiration pneumonia ([place]) 05/23/2016  . Cough in adult 05/05/2016  . Multiple myeloma in relapse ([place]) 09/09/2015  . Congestive heart failure ([place]) 01/06/2015  . Cardiomyopathy ([place]) 01/06/2015  . Nonsustained ventricular tachycardia ([place]) 01/06/2015  . Malnutrition of moderate degree 12/24/2014  . Pressure ulcer 12/24/2014  . Cellulitis of second finger of left hand   .  Sepsis ([place]) 12/23/2014  . 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 Arteriosclerosis of coronary artery 08/22/2011  . Cardiac conduction disorder 08/22/2011  . Myocardial infarction ([place]) 08/22/2011  . Cardiac arrhythmia 08/22/2011  . Anxiety 08/21/2011  . Acid reflux 08/21/2011  . Chronic obstructive pulmonary disease ([place]) 07/26/2011  . Nicotine addiction 07/26/2011    Past Surgical History:  Procedure Laterality Date  . CARDIAC DEFIBRILLATOR PLACEMENT      Prior to Admission medications   Medication Sig Start Date End Date Taking? Authorizing Provider  acetaminophen (TYLENOL) 325 MG tablet Take 2 tablets (650 mg total) by mouth every 6 (six) hours as needed for mild pain (or Fever >/= 101). 05/28/16   Gouru, Illene Silver, MD  albuterol (PROVENTIL HFA;VENTOLIN HFA) 108 (90 Base) MCG/ACT inhaler Inhale 2 puffs into the lungs every 6 (six) hours as needed for wheezing or shortness of breath. 04/09/17   Flora Lipps, MD  ALPRAZolam Duanne Moron) 0.5 MG tablet Take 1 tablet (0.5 mg total) by mouth 3 (three) times daily as needed for anxiety. 04/11/18   Cammie Sickle, MD  aspirin EC 81 MG EC tablet Take 1 tablet (81 mg total) by mouth daily. 03/13/18   Hillary Bow, MD  atorvastatin (LIPITOR) 20 MG tablet Take 20 mg by mouth daily. 10/11/16   [provider]  clopidogrel (PLAVIX) 75 MG tablet Take 1 tablet (75 mg total) by mouth daily. 03/13/18   Sudini, Srikar, MD  dexamethasone (DECADRON) 4 MG tablet TAKE 3 TABLETS (12 MG TOTAL) BY MOUTH ONCE  A WEEK. TAKE WITH BREAKFAST 01/18/18   Lloyd Huger, MD  digoxin (LANOXIN) 0.25 MG tablet Take 1 tablet (0.25 mg total) by mouth daily. 03/13/18   Hillary Bow, MD  DULoxetine (CYMBALTA) 30 MG capsule Take 1 capsule (30 mg total) by mouth daily. 03/29/18   Cammie Sickle, MD  fentaNYL (DURAGESIC) 100 MCG/HR Place 1 patch onto the skin every 3 (three) days. Use with a 25 mcg patch for total dose of 125 mcg  04/11/18   Cammie Sickle, MD  fentaNYL (DURAGESIC) 25 MCG/HR Place 1 patch onto the skin every 3 (three) days. Use with a 100 mcg patch for total dose of 125 mcg 04/11/18   Cammie Sickle, MD  Fluticasone-Salmeterol (ADVAIR DISKUS) 250-50 MCG/DOSE AEPB Inhale 1 puff into the lungs 2 (two) times daily. 07/20/17 07/20/18  Flora Lipps, MD  gabapentin (NEURONTIN) 100 MG capsule Take 2 capsules (200 mg total) by mouth 3 (three) times daily. 03/29/18   Cammie Sickle, MD  HYDROmorphone (DILAUDID) 2 MG tablet Take 1 tablet (2 mg total) by mouth every 4 (four) hours as needed for severe pain. 04/05/18   Cammie Sickle, MD  ipratropium-albuterol (DUONEB) 0.5-2.5 (3) MG/3ML SOLN Take 3 mLs by nebulization every 4 (four) hours as needed. 04/09/17   Flora Lipps, MD  megestrol (MEGACE) 20 MG tablet TAKE ONE TABLET BY MOUTH ONCE DAILY 02/26/18   Cammie Sickle, MD  metoprolol tartrate (LOPRESSOR) 25 MG tablet Take 1 tablet (25 mg total) by mouth 2 (two) times daily. 03/13/18   Vaughan Basta, MD  naloxone [facility]) nasal spray 4 mg/0.1 mL For Opioid Overdose: Call 911 and administer a single spray of Narcan in one nostril. Repeat every 68mns as needed if no or minimal response Patient not taking: Reported on 03/19/2018 02/20/18   BCammie Sickle MD  NINLARO 4 MG capsule TAKE 1 CAPSULE (4 MG TOTAL) BY MOUTH ONCE A WEEK. TAKE FOR 3 WEEKS ON, THEN 1 WEEK OFF. TAKE ON EMPTY STOMACH 1HR BEFORE OR 2HRS AFTER FOOD. 01/18/18   FLloyd Huger MD  ondansetron (ZOFRAN) 8 MG tablet 1 pill every 8 hours as needed. 04/02/18   BCammie Sickle MD  tamsulosin (FLOMAX) 0.4 MG CAPS capsule Take 1 capsule (0.4 mg total) by mouth daily. 02/18/18   PDustin Flock MD  tiotropium (SPIRIVA HANDIHALER) 18 MCG inhalation capsule Place 1 capsule (18 mcg total) into inhaler and inhale daily. 04/09/17 04/11/18  KFlora Lipps MD  vitamin B-12 1000 MCG tablet Take 1 tablet (1,000 mcg total) by  mouth daily. 03/14/18   VVaughan Basta MD    Allergies Patient has no known allergies.  Family History  Problem Relation Age of Onset  . COPD Mother   . CAD Mother   . Heart attack Father   . Prostate cancer Maternal Grandfather   . Kidney cancer Neg Hx   . Bladder Cancer Neg Hx     Social History Social History   Tobacco Use  . Smoking status: Current Every Day Smoker    Packs/day: 0.50    Years: 40.00    Pack years: 20.00    Types: Cigarettes  . Smokeless tobacco: Never Used  Substance Use Topics  . Alcohol use: No    Alcohol/week: 0.0 standard drinks  . Drug use: No    Review of Systems  Constitutional: No fever. Positive for generalized weakness. Eyes: No redness. ENT: No sore throat. Cardiovascular: Denies chest pain. Respiratory: Denies shortness of breath.  Gastrointestinal: No vomiting.  Positive for diarrhea.  Genitourinary: Negative for dysuria.  Musculoskeletal: Negative for back pain. Skin: Negative for rash. Neurological: Negative for headache.  Positive for peripheral neuropathy.   ____________________________________________   PHYSICAL EXAM:  VITAL SIGNS: ED Triage Vitals  Enc Vitals Group     BP 05/07/18 1717 110/81     Pulse Rate 05/07/18 1717 (!) 108     Resp 05/07/18 1717 17     Temp 05/07/18 1717 98.5 F (36.9 C)     Temp Source 05/07/18 1717 Oral     SpO2 05/07/18 1717 99 %     Weight 05/07/18 1712 126 lb (57.2 kg)     Height 05/07/18 1712 6' (1.829 m)     Head Circumference --      Peak Flow --      Pain Score 05/07/18 1712 0     Pain Loc --      Pain Edu? --      Excl. in Micro? --     Constitutional: Alert and oriented.  Somewhat chronically weak appearing but in no acute distress. Eyes: Conjunctivae are normal.  EOMI.  PERRLA. Head: Atraumatic. Nose: Nasal congestion. Mouth/Throat: Mucous membranes are moist.   Neck: Normal range of motion.  Cardiovascular: Tachycardic, regular rhythm. Grossly normal heart  sounds.  Good peripheral circulation. Respiratory: Slightly increased respiratory effort.  No retractions. Lungs CTAB. Gastrointestinal: Soft and nontender. No distention.  Genitourinary: No flank tenderness. Musculoskeletal: Extremities warm and well perfused.  Left elbow with superficial bruising but full range of motion and no bony tenderness. Neurologic:  Normal speech and language.  Motor intact in all extremities.  Normal coordination.  No gross focal neurologic deficits are appreciated.  Skin:  Skin is warm and dry. No rash noted. Psychiatric: Mood and affect are normal. Speech and behavior are normal.  ____________________________________________   LABS (all labs ordered are listed, but only abnormal results are displayed)  Labs Reviewed  CBC WITH DIFFERENTIAL/PLATELET - Abnormal; Notable for the following components:      Result Value   Hemoglobin 12.5 (*)    HCT 38.2 (*)    All other components within normal limits  COMPREHENSIVE METABOLIC PANEL - Abnormal; Notable for the following components:   Potassium 3.0 (*)    Glucose, Bld 120 (*)    AST 12 (*)    Alkaline Phosphatase 29 (*)    All other components within normal limits  URINALYSIS, COMPLETE (UACMP) WITH MICROSCOPIC - Abnormal; Notable for the following components:   Color, Urine YELLOW (*)    APPearance CLEAR (*)    All other components within normal limits  TROPONIN I  LACTIC ACID, PLASMA  LACTIC ACID, PLASMA   ____________________________________________  EKG  ED ECG REPORT I, Arta Silence, the attending physician, personally viewed and interpreted this ECG.  Date: 05/07/2018 EKG Time: 1719 Rate: 102 Rhythm: Sinus tachycardia QRS Axis: normal Intervals: RBBB, LPFB ST/T Wave abnormalities: normal Narrative Interpretation: no evidence of acute ischemia; no significant change when compared to EKG of 03/10/2018  ____________________________________________  RADIOLOGY  CXR: No focal infiltrate  or other acute abnormality  ____________________________________________   PROCEDURES  Procedure(s) performed: No  Procedures  Critical Care performed: No ____________________________________________   INITIAL IMPRESSION / ASSESSMENT AND PLAN / ED COURSE  Pertinent labs & imaging results that were available during my care of the patient were reviewed by me and considered in my medical decision making (see chart for details).  58 year old male with PMH as  noted above presents with multiple symptoms over the last several days, primarily generalized weakness and lightheadedness as well as worsening neuropathy pain.  He also had a fall with no LOC and hit his head and right elbow.  He denies shortness of breath or fever.  On exam, the patient is overall relatively comfortable appearing.  He has mild tachycardia but otherwise normal vital signs and is afebrile.  O2 is in the high 90s on his normal oxygen by nasal cannula.  Neuro exam is nonfocal.  He has full range of motion at the left elbow with bruising but no bony tenderness.  I reviewed the past medical records in [place].  The patient was admitted in February with CVA and encephalopathy.  I confirmed his past medical history of COPD as well as multiple myeloma.  Differential for today's presentation includes dehydration, electrolyte abnormality or other metabolic etiology, gastroenteritis or other infectious cause, or less likely cardiac etiology.  We will give fluids and obtain lab work-up.  There is no indication for CT of his head given the lack of focal neurologic findings.  ----------------------------------------- 9:06 PM on 05/07/2018 -----------------------------------------  The lab work-up is unremarkable.  The patient is feeling better after fluids.  His tachycardia has resolved.  At this time, he is stable for discharge home.  The patient feels well to go home.  I counseled him on the results of the work-up.  Return  precautions given, and he expresses understanding.  ____________________  [name] was evaluated in Emergency Department on 05/07/2018 for the symptoms described in the history of present illness. He was evaluated in the context of the global COVID-19 pandemic, which necessitated consideration that the patient might be at risk for infection with the SARS-CoV-2 virus that causes COVID-19. Institutional protocols and algorithms that pertain to the evaluation of patients at risk for COVID-19 are in a state of rapid change based on information released by regulatory bodies including the CDC and federal and state organizations. These policies and algorithms were followed during the patient's care in the ED. ____________________________________________   FINAL CLINICAL IMPRESSION(S) / ED DIAGNOSES  Final diagnoses:  Generalized weakness  Contusion of left elbow, initial encounter      NEW MEDICATIONS STARTED DURING THIS VISIT:  New Prescriptions   No medications on file     Note:  This document was prepared using Dragon voice recognition software and may include unintentional dictation errors.       Arta Silence, MD 05/07/18 2109

## 2018-05-07 NOTE — Telephone Encounter (Signed)
I spoke with patient's PCP, Dr. Shade Flood. I updated him on patient's current plan of care including intrathecal pain pump. We discussed medical clearance for the procedure. He will call and speak with Dr. Sula Soda.

## 2018-05-07 NOTE — ED Notes (Addendum)
This RN has answered call bell multiple times. PT has been provided warm blankets twice, provided tissues, shoes taken off, belongings placed in bag, bed rearranged, bed moved, paper towels provided, and IV pole moved. PT is requesting new surgical mask.  PT states he cannot urinate at this time.

## 2018-05-07 NOTE — ED Notes (Signed)
Pt's blankets rearranged for him.

## 2018-05-07 NOTE — ED Notes (Signed)
PT attempting to provide urine sample

## 2018-05-07 NOTE — Discharge Instructions (Addendum)
Return to the ER for new, worsening, persistent weakness, lightheadedness, fever, difficulty breathing, or any other new or worsening symptoms that concern you.  Follow-up with your regular doctor.

## 2018-05-07 NOTE — ED Triage Notes (Signed)
PT to ED from home via EMS. PT c/o weakness/lightheadedness/diarrhea for 3 days. HX copd and bone cancer. PT appears out of breath during conversation. PT on 3L o2 chronically    101/71 95 112 heart rate  cbg 188 98 oral

## 2018-05-07 NOTE — ED Notes (Addendum)
Pt states he cannot feel his feet, this is not a new problem.  PT is also very anxious about being in ED, to the point of being tearful. RN encouraged pt that we would take our best care of him.

## 2018-05-08 ENCOUNTER — Other Ambulatory Visit: Payer: Self-pay | Admitting: *Deleted

## 2018-05-08 DIAGNOSIS — C9000 Multiple myeloma not having achieved remission: Secondary | ICD-10-CM

## 2018-05-08 DIAGNOSIS — F419 Anxiety disorder, unspecified: Secondary | ICD-10-CM

## 2018-05-08 NOTE — Telephone Encounter (Signed)
[name]- we have decided NOT to prescribe any narcotics/benzos for him. He recently was prescribed some pain medications from pain clinic, in [place]. Please speak to [place].

## 2018-05-11 ENCOUNTER — Other Ambulatory Visit: Payer: Self-pay | Admitting: Internal Medicine

## 2018-05-11 DIAGNOSIS — C9000 Multiple myeloma not having achieved remission: Secondary | ICD-10-CM

## 2018-05-11 DIAGNOSIS — F419 Anxiety disorder, unspecified: Secondary | ICD-10-CM

## 2018-05-12 ENCOUNTER — Encounter: Payer: Self-pay | Admitting: *Deleted

## 2018-05-12 ENCOUNTER — Emergency Department
Admission: EM | Admit: 2018-05-12 | Discharge: 2018-05-13 | Disposition: A | Discharge status: Home / Self Care | Payer: Medicaid Other | Attending: Internal Medicine | Admitting: Internal Medicine

## 2018-05-12 ENCOUNTER — Other Ambulatory Visit: Payer: Self-pay

## 2018-05-12 DIAGNOSIS — E785 Hyperlipidemia, unspecified: Secondary | ICD-10-CM | POA: Insufficient documentation

## 2018-05-12 DIAGNOSIS — I509 Heart failure, unspecified: Secondary | ICD-10-CM | POA: Insufficient documentation

## 2018-05-12 DIAGNOSIS — F419 Anxiety disorder, unspecified: Secondary | ICD-10-CM | POA: Insufficient documentation

## 2018-05-12 DIAGNOSIS — F17210 Nicotine dependence, cigarettes, uncomplicated: Secondary | ICD-10-CM | POA: Diagnosis not present

## 2018-05-12 DIAGNOSIS — I2510 Atherosclerotic heart disease of native coronary artery without angina pectoris: Secondary | ICD-10-CM | POA: Insufficient documentation

## 2018-05-12 DIAGNOSIS — J449 Chronic obstructive pulmonary disease, unspecified: Secondary | ICD-10-CM | POA: Diagnosis not present

## 2018-05-12 DIAGNOSIS — R197 Diarrhea, unspecified: Secondary | ICD-10-CM | POA: Insufficient documentation

## 2018-05-12 LAB — COMPREHENSIVE METABOLIC PANEL
ALT: 7 U/L (ref 0–44)
AST: 10 U/L — ABNORMAL LOW (ref 15–41)
Albumin: 3.8 g/dL (ref 3.5–5.0)
Alkaline Phosphatase: 31 U/L — ABNORMAL LOW (ref 38–126)
Anion gap: 8 (ref 5–15)
BUN: 9 mg/dL (ref 6–20)
CO2: 26 mmol/L (ref 22–32)
Calcium: 8.9 mg/dL (ref 8.9–10.3)
Chloride: 108 mmol/L (ref 98–111)
Creatinine, Ser: 0.62 mg/dL (ref 0.61–1.24)
GFR calc Af Amer: >60 mL/min (ref >60)
GFR calc non Af Amer: >60 mL/min (ref >60)
Glucose, Bld: 94 mg/dL (ref 70–99)
Potassium: 3 mmol/L — ABNORMAL LOW (ref 3.5–5.1)
Sodium: 142 mmol/L (ref 135–145)
Total Bilirubin: 0.6 mg/dL (ref 0.3–1.2)
Total Protein: 6.5 g/dL (ref 6.5–8.1)

## 2018-05-12 LAB — CBC WITH DIFFERENTIAL/PLATELET
Abs Immature Granulocytes: 0.04 10*3/uL (ref 0.00–0.07)
Basophils Absolute: 0 10*3/uL (ref 0.0–0.1)
Basophils Relative: 0 %
Eosinophils Absolute: 0.1 10*3/uL (ref 0.0–0.5)
Eosinophils Relative: 1 %
HCT: 41.8 % (ref 39.0–52.0)
Hemoglobin: 13.2 g/dL (ref 13.0–17.0)
Immature Granulocytes: 0 %
Lymphocytes Relative: 16 %
Lymphs Abs: 1.4 10*3/uL (ref 0.7–4.0)
MCH: 27.7 pg (ref 26.0–34.0)
MCHC: 31.6 g/dL (ref 30.0–36.0)
MCV: 87.6 fL (ref 80.0–100.0)
Monocytes Absolute: 0.6 10*3/uL (ref 0.1–1.0)
Monocytes Relative: 6 %
Neutro Abs: 7.1 10*3/uL (ref 1.7–7.7)
Neutrophils Relative %: 77 %
Platelets: 279 10*3/uL (ref 150–400)
RBC: 4.77 MIL/uL (ref 4.22–5.81)
RDW: 14.6 % (ref 11.5–15.5)
WBC: 9.2 10*3/uL (ref 4.0–10.5)
nRBC: 0 % (ref 0.0–0.2)

## 2018-05-12 LAB — DIGOXIN LEVEL: Digoxin Level: 0.7 ng/mL — ABNORMAL LOW (ref 0.8–2.0)

## 2018-05-12 LAB — LIPASE, BLOOD: Lipase: 23 U/L (ref 11–51)

## 2018-05-12 MED ORDER — MORPHINE SULFATE (PF) 4 MG/ML IV SOLN
4.0000 mg | Freq: Once | INTRAVENOUS | Status: AC
  Administered 2018-05-12: 20:00:00 4 mg via INTRAVENOUS
  Filled 2018-05-12: qty 1

## 2018-05-12 MED ORDER — ONDANSETRON HCL 4 MG/2ML IJ SOLN
4.0000 mg | Freq: Once | INTRAMUSCULAR | Status: AC
  Administered 2018-05-12: 4 mg via INTRAVENOUS
  Filled 2018-05-12: qty 2

## 2018-05-12 MED ORDER — HYDROMORPHONE HCL 1 MG/ML IJ SOLN
1.0000 mg | Freq: Once | INTRAMUSCULAR | Status: AC
  Administered 2018-05-12: 22:00:00 1 mg via INTRAVENOUS
  Filled 2018-05-12: qty 1

## 2018-05-12 MED ORDER — SODIUM CHLORIDE 0.9 % IV SOLN
Freq: Once | INTRAVENOUS | Status: AC
  Administered 2018-05-12: 20:00:00 via INTRAVENOUS

## 2018-05-12 NOTE — ED Provider Notes (Signed)
[facility] Emergency Department Provider Note       Time seen: ----------------------------------------- 7:37 PM on 05/12/2018 -----------------------------------------   I have reviewed the triage vital signs and the nursing notes.  HISTORY   Chief Complaint Diarrhea    HPI [name] is a 58 y.o. male with a history of anxiety, arthritis, CHF, COPD, coronary artery disease, hyperlipidemia, multiple myeloma, MI, substance abuse who presents to the ED for persistent diarrhea.  Patient states has had nonstop diarrhea for the past 2 days.  Currently last chemotherapy was 2 days ago.  He is also run out of his pain medicine and anxiety medicine.  Past Medical History:  Diagnosis Date  . Anxiety   . Arthritis   . Atrial septal defect   . CHF (congestive heart failure) ([place])   . COPD (chronic obstructive pulmonary disease) ([place])   . Coronary artery disease   . Dysrhythmias   . GERD (gastroesophageal reflux disease)   . Hyperlipidemia   . Multiple myeloma ([place])   . Multiple myeloma ([place])   . Myocardial infarction ([place])   . Substance abuse [facility])     Patient Active Problem List   Diagnosis Date Noted  . Palliative care encounter   . Altered mental status, unspecified 03/08/2018  . CVA (cerebral vascular accident) ([place]) 03/08/2018  . Gastroenteritis 02/15/2018  . Fatigue due to treatment 09/07/2016  . Palliative care by specialist   . Advance care planning   . Other insomnia   . Protein-calorie malnutrition, severe 05/25/2016  . Aspiration pneumonia ([place]) 05/23/2016  . Cough in adult 05/05/2016  . Multiple myeloma in relapse ([place]) 09/09/2015  . Congestive heart failure ([place]) 01/06/2015  . Cardiomyopathy ([place]) 01/06/2015  . Nonsustained ventricular tachycardia ([place]) 01/06/2015  . Malnutrition of moderate degree 12/24/2014  . Pressure ulcer 12/24/2014  . Cellulitis of second finger of left hand   . Sepsis ([place]) 12/23/2014  . 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 Arteriosclerosis of coronary artery 08/22/2011  . Cardiac conduction disorder 08/22/2011  . Myocardial infarction ([place]) 08/22/2011  . Cardiac arrhythmia 08/22/2011  . Anxiety 08/21/2011  . Acid reflux 08/21/2011  . Chronic obstructive pulmonary disease ([place]) 07/26/2011  . Nicotine addiction 07/26/2011    Past Surgical History:  Procedure Laterality Date  . CARDIAC DEFIBRILLATOR PLACEMENT      Allergies Patient has no known allergies.  Social History Social History   Tobacco Use  . Smoking status: Current Every Day Smoker    Packs/day: 0.50    Years: 40.00    Pack years: 20.00    Types: Cigarettes  . Smokeless tobacco: Never Used  Substance Use Topics  . Alcohol use: No    Alcohol/week: 0.0 standard drinks  . Drug use: No   Review of Systems Constitutional: Negative for fever. Cardiovascular: Negative for chest pain. Respiratory: Negative for shortness of breath. Gastrointestinal: Positive for diarrhea Musculoskeletal: Negative for back pain.  Positive for diffuse pain Skin: Negative for rash. Neurological: Negative for headaches, focal weakness or numbness.  All systems negative/normal/unremarkable except as stated in the HPI  ____________________________________________   PHYSICAL EXAM:  VITAL SIGNS: ED Triage Vitals  Enc Vitals Group     BP 05/12/18 1929 121/89     Pulse Rate 05/12/18 1929 (!) 104     Resp --      Temp 05/12/18 1929 97.9 F (36.6 C)     Temp Source 05/12/18 1929 Oral     SpO2 05/12/18 1929 96 %  Weight 05/12/18 1931 125 lb (56.7 kg)     Height 05/12/18 1931 6' (1.829 m)     Head Circumference --      Peak Flow --      Pain Score 05/12/18 1931 10     Pain Loc --      Pain Edu? --      Excl. in [place]? --    Constitutional: Alert and oriented.  Chronically ill-appearing, no distress Eyes: Conjunctivae are normal. Normal extraocular movements. ENT      Head:  Normocephalic and atraumatic.      Nose: No congestion/rhinnorhea.      Mouth/Throat: Mucous membranes are moist.      Neck: No stridor. Cardiovascular: Normal rate, regular rhythm. No murmurs, rubs, or gallops. Respiratory: Normal respiratory effort without tachypnea nor retractions. Breath sounds are clear and equal bilaterally. No wheezes/rales/rhonchi. Gastrointestinal: Soft and nontender. Normal bowel sounds Musculoskeletal: Nontender with normal range of motion in extremities. No lower extremity tenderness nor edema. Neurologic:  Normal speech and language. No gross focal neurologic deficits are appreciated.  Skin:  Skin is warm, dry and intact. No rash noted. Psychiatric: Mood and affect are normal. Speech and behavior are normal.  ___________________________________________  ED COURSE:  As part of my medical decision making, I reviewed the following data within the [place] History obtained from family if available, nursing notes, old chart and ekg, as well as notes from prior ED visits. Patient presented for persistent diarrhea, we will assess with labs and imaging as indicated at this time.   Procedures  [name] was evaluated in Emergency Department on 05/12/2018 for the symptoms described in the history of present illness. He was evaluated in the context of the global COVID-19 pandemic, which necessitated consideration that the patient might be at risk for infection with the SARS-CoV-2 virus that causes COVID-19. Institutional protocols and algorithms that pertain to the evaluation of patients at risk for COVID-19 are in a state of rapid change based on information released by regulatory bodies including the CDC and federal and state organizations. These policies and algorithms were followed during the patient's care in the ED.  ____________________________________________   LABS (pertinent positives/negatives)  Labs Reviewed  COMPREHENSIVE METABOLIC PANEL  - Abnormal; Notable for the following components:      Result Value   Potassium 3.0 (*)    AST 10 (*)    Alkaline Phosphatase 31 (*)    All other components within normal limits  DIGOXIN LEVEL - Abnormal; Notable for the following components:   Digoxin Level 0.7 (*)    All other components within normal limits  GASTROINTESTINAL PANEL BY PCR, STOOL (REPLACES STOOL CULTURE)  C DIFFICILE QUICK SCREEN W PCR REFLEX  CBC WITH DIFFERENTIAL/PLATELET  LIPASE, BLOOD  URINALYSIS, COMPLETE (UACMP) WITH MICROSCOPIC   ____________________________________________   DIFFERENTIAL DIAGNOSIS   Infectious diarrhea, withdrawal, dehydration, electrolyte abnormality  FINAL ASSESSMENT AND PLAN  Diarrhea   Plan: The patient had presented for persistent diarrhea. Patient's labs so far have been unremarkable.  Patient's clinical course is complicated by chronic pain and in the past dependency on narcotics.  Have discussed with his oncologist who does not recommend prescribing opiates for him to go home with.   Laurence Aly, MD    Note: This note was generated in part or whole with voice recognition software. Voice recognition is usually quite accurate but there are transcription errors that can and very often do occur. I apologize for any  typographical errors that were not detected and corrected.     Earleen Newport, MD 05/12/18 2329

## 2018-05-12 NOTE — ED Notes (Signed)
Patient given warm blanket.

## 2018-05-12 NOTE — ED Notes (Signed)
Patient states his oncologist "cut me off my medication, ( Xanax and Dilaudid, "pain patch") because I took the wrong medicine.

## 2018-05-12 NOTE — ED Notes (Signed)
Patient placed on bedpan.

## 2018-05-12 NOTE — ED Notes (Signed)
Patient states he is unable to void or have a bowel movement at this time.

## 2018-05-12 NOTE — ED Notes (Signed)
Patient requested pain medication. Dr. Jimmye Norman aware.

## 2018-05-12 NOTE — Progress Notes (Signed)
Called patient to prescreen for COVID 19.  Patient states he has had one episode of "watery diarrhea" today and is in severe pain.  Rates pain an 11 on a 0/10 scale.  Patient is scheduled to see [name] tomorrow fdayor pallative care.  I informed patient I would send [name] and Dr. Rogue Bussing a message about his pain and diarrhea.  He was encouraged to discuss this with [name] at his visit tomorrow.

## 2018-05-12 NOTE — ED Triage Notes (Signed)
Per EMS report, patient c/o diarrhea for two days. Patient is being treated for multiple myeloma, last chem was two days ago.  B/P 104/70, pulse 98, temp 98.1 orally.

## 2018-05-12 NOTE — ED Notes (Signed)
Patient removed from the bedpan and repositioned on the stretcher.

## 2018-05-12 NOTE — ED Notes (Signed)
Patient remains on bedpan per his request. Patient requested ice water. Dr. Jimmye Norman aware.

## 2018-05-13 ENCOUNTER — Telehealth: Payer: Self-pay | Admitting: *Deleted

## 2018-05-13 ENCOUNTER — Inpatient Hospital Stay: Payer: Medicaid Other | Attending: Hospice and Palliative Medicine | Admitting: Hospice and Palliative Medicine

## 2018-05-13 ENCOUNTER — Telehealth: Payer: Self-pay | Admitting: Internal Medicine

## 2018-05-13 DIAGNOSIS — G893 Neoplasm related pain (acute) (chronic): Secondary | ICD-10-CM | POA: Diagnosis not present

## 2018-05-13 DIAGNOSIS — Z710 Person encountering health services to consult on behalf of another person: Secondary | ICD-10-CM

## 2018-05-13 DIAGNOSIS — Z515 Encounter for palliative care: Secondary | ICD-10-CM | POA: Diagnosis not present

## 2018-05-13 DIAGNOSIS — C9002 Multiple myeloma in relapse: Secondary | ICD-10-CM

## 2018-05-13 LAB — GASTROINTESTINAL PANEL BY PCR, STOOL (REPLACES STOOL CULTURE)

## 2018-05-13 LAB — C DIFFICILE QUICK SCREEN W PCR REFLEX
C Diff antigen: NEGATIVE
C Diff interpretation: NOT DETECTED
C Diff toxin: NEGATIVE

## 2018-05-13 NOTE — Telephone Encounter (Signed)
Discussed with [name] Borders-regarding patient's recent visit to the ER for likely narcotic withdrawal.  Will reach out to patient regarding a follow-up in the clinic that was planned this afternoon.  Also reach out to [facility] clinic regarding patient's pain management. FYI

## 2018-05-13 NOTE — ED Notes (Addendum)
ED provider and this RN to bedside, pt on cell phone, pt's previous report of "a ball back there" (rectal blockage) is denied now, pt POC updated - pt up dated family via cell phone  e

## 2018-05-13 NOTE — Discharge Instructions (Addendum)
Please return for any fever or if you get very lightheaded or weak.  Please follow-up with your regular doctor.  You can try some Pepto-Bismol that may help for the diarrhea.

## 2018-05-13 NOTE — ED Provider Notes (Signed)
Patient now just passing a lot of gas.  His pain is better.  He denies feeling any fecal impaction or anything similar.  His stool studies are negative.  He is not having any other symptoms of withdrawal.  I will let him go at this point.  He can return for any other problems.  If he gets lightheaded we can have him come back and give some fluids.   Nena Polio, MD 05/13/18 [phone]

## 2018-05-13 NOTE — Progress Notes (Signed)
Virtual Visit via Telephone Note  I connected with [name] on 05/13/18 at  1:00 PM EDT by telephone and verified that I am speaking with the correct person using two identifiers.   I discussed the limitations, risks, security and privacy concerns of performing an evaluation and management service by telephone and the availability of in person appointments. I also discussed with the patient that there may be a patient responsible charge related to this service. The patient expressed understanding and agreed to proceed.   History of Present Illness: Palliative Care consult requested for this57 y.o.malewith multiple medical problems including recurrent multiple myeloma with multiple PET positive bone lesions most recently treated on [place], O2 dependent COPD, diastolic dysfunction with history of CHF, atrial septal defect, and tobacco abuse.Patient was admitted to the hospital on 03/08/2018 to 03/13/2018 under involuntary commitment for agitation and hallucinations. Initially, patient was thought to have had an acute CVA on head CT but reimaging appeared consistent with artifact. AMS was thought probably related to medications.  Patient has had chronic pain and anxiety on fentanyl and hydromorphone.Palliative care was consulted to help address goals and assist with symptom management.   Observations/Objective: Patient presented yesterday to the ER with acute pain and diarrhea.  Diarrhea was likely associated with opioid withdrawal.  I called today to speak with patient and significant other.  Pain is reportedly still severe.  I called and spoke with Dr. Sula Soda with [facility].  He will see patient today or tomorrow for virtual visit and plan to prescribe a week at a time of opioids until patient can be implanted with intrathecal pain pump.   Note that patient was supposed to see Dr. Sula Soda last Friday (05/10/18) but did not show to his appointment.  Dr. Sula Soda requested help coordinating  medical clearance for pain pump from cardiology.  Apparently general or local anesthesia is an option. Clearly, local/regional anesthesia would pose reduced risk given patient's comorbidities. I called Dr. Laurelyn Sickle office and left a voicemail.   Assessment and Plan: Recommended f/u with pain clinic Called Dr. Laurelyn Sickle office regarding medical clearance Discussed with Dr. Rogue Bussing    I discussed the assessment and treatment plan with the patient. The patient was provided an opportunity to ask questions and all were answered. The patient agreed with the plan and demonstrated an understanding of the instructions.   The patient was advised to call back or seek an in-person evaluation if the symptoms worsen or if the condition fails to improve as anticipated.  I provided 15 minutes of non-face-to-face time during this encounter.   Irean Hong, NP

## 2018-05-13 NOTE — ED Notes (Signed)
Peripheral IV discontinued. Catheter intact. No signs of infiltration or redness. Gauze applied to IV site.   Discharge instructions reviewed with patient. Questions fielded by this RN. Patient verbalizes understanding of instructions. Patient discharged home in stable condition per malinda. No acute distress noted at time of discharge.

## 2018-05-13 NOTE — ED Notes (Signed)
Call from family member - "can't start car to pick up", pt returned to room from lobby, family requests transport via EMS, Dr Rip Harbour and Modena Nunnery, [place] notified

## 2018-05-13 NOTE — ED Notes (Signed)
Reports and VS to EMS att

## 2018-05-13 NOTE — ED Notes (Signed)
Pt back to room from lobby toilet  Pt apologizes for daughter-in-law calling and "doing all that"

## 2018-05-13 NOTE — ED Notes (Signed)
Report given to Noel RN 

## 2018-05-13 NOTE — Telephone Encounter (Signed)
Patients wife left vm on triage line requested for todays appt with [name] B to be cancelled

## 2018-05-14 ENCOUNTER — Telehealth: Payer: Self-pay | Admitting: *Deleted

## 2018-05-14 NOTE — Telephone Encounter (Signed)
Patients girlfriend left message on triage vm "why can you people not just leave him alone, he is dying".  I did not return the call.  Notified [name] B and Heather J of this vm.

## 2018-05-15 ENCOUNTER — Telehealth: Payer: Self-pay | Admitting: *Deleted

## 2018-05-15 NOTE — Telephone Encounter (Signed)
Per Jackelyn Poling at Dr. Kendell Bane office. Patient will be evaluated by Dr. Alvie Heidelberg tomorrow for pt's self-referral.  She wanted Dr. Rogue Bussing to be made aware.

## 2018-05-22 ENCOUNTER — Encounter: Payer: Self-pay | Admitting: Internal Medicine

## 2018-05-22 ENCOUNTER — Ambulatory Visit: Payer: Medicaid Other | Admitting: Internal Medicine

## 2018-05-22 ENCOUNTER — Ambulatory Visit (INDEPENDENT_AMBULATORY_CARE_PROVIDER_SITE_OTHER): Payer: Medicaid Other | Admitting: Internal Medicine

## 2018-05-22 DIAGNOSIS — J449 Chronic obstructive pulmonary disease, unspecified: Secondary | ICD-10-CM

## 2018-05-22 DIAGNOSIS — C9000 Multiple myeloma not having achieved remission: Secondary | ICD-10-CM

## 2018-05-22 DIAGNOSIS — F419 Anxiety disorder, unspecified: Secondary | ICD-10-CM

## 2018-05-22 DIAGNOSIS — F17210 Nicotine dependence, cigarettes, uncomplicated: Secondary | ICD-10-CM

## 2018-05-22 MED ORDER — FLUTICASONE-SALMETEROL 250-50 MCG/DOSE IN AEPB: 1.0000 | INHALATION_SPRAY | Freq: Two times a day (BID) | RESPIRATORY_TRACT | 2 refills | Status: AC

## 2018-05-22 MED ORDER — TIOTROPIUM BROMIDE MONOHYDRATE 18 MCG IN CAPS: 18.0000 ug | ORAL_CAPSULE | Freq: Every day | RESPIRATORY_TRACT | 6 refills | Status: AC

## 2018-05-22 MED ORDER — ALBUTEROL SULFATE HFA 108 (90 BASE) MCG/ACT IN AERS: 2.0000 | INHALATION_SPRAY | Freq: Four times a day (QID) | RESPIRATORY_TRACT | 6 refills | Status: DC | PRN

## 2018-05-22 MED ORDER — IPRATROPIUM-ALBUTEROL 0.5-2.5 (3) MG/3ML IN SOLN: 3.0000 mL | RESPIRATORY_TRACT | 3 refills | Status: AC | PRN

## 2018-05-22 NOTE — Progress Notes (Signed)
Name: [name] MRN:   [id number] DOB:   [birth year]           VIDEO/TELEPHONE VISIT    In the setting of the current Covid19 crisis, you are scheduled for a  visit with me on 05/22/2018  Just as we do with many in-office visits, in order for you to participate in this visit, we must obtain consent.   I can obtain your verbal consent now.  PATIENT AGREES AND CONFIRMS -YES This Visit has Audio and Visual Capabilities for optimal patient care experience   Evaluation Performed:  Follow-up visit  This visit type was conducted due to national recommendations for restrictions regarding the COVID-19 Pandemic (e.g. social distancing).  This format is felt to be most appropriate for this patient at this time.  All issues noted in this document were discussed and addressed.     Virtual Visit via Telephone Note     I connected with patient on 05/22/2018  by telephone and verified that I am speaking with the correct person using two identifiers.   I discussed the limitations, risks, security and privacy concerns of performing an evaluation and management service by telephone and the availability of in person appointments. I also discussed with the patient that there may be a patient responsible charge related to this service. The patient expressed understanding and agreed to proceed.   Location of the patient: Home Location of provider: Home Participating persons: Patient and provider only   BRIEF PATIENT DESCRIPTION:  58 yo male with known history of Diastolic Heart failure, COPD and Multiple Myeloma admitted with acute encephalopathy, nausea/vomiting, acute on chronic hypoxic respiratory failure secondary to possible aspiration pneumonia and AECOPD  CXR 05/23/16 I have Independently reviewed images of CXR   Interpretation:no acutue process, no effusions  SIGNIFICANT EVENTS  5/1 Patient admitted to the ICU with increased sob requiring NRB    CC  Follow up COPD   HPI   +chronic SOB and DOE Still smokes  Uses and benefits from oxygen 24/7  3L Nasal cannula Patient uses and benefits from oxygen therapy He needs this to survive  Did not tolerate Chantix or nicotine patches   Patient in pain Wants pain meds-sees palliative care and pain clinic   Smoking Assessment and Cessation Counseling   Upon further questioning, Patient smokes 1/2 PPD  I have advised patient to quit/stop smoking as soon as possible due to high risk for multiple medical problems  Patient is NOT willing to quit smoking  I have advised patient that we can assist and have options of Nicotine replacement therapy. I also advised patient on behavioral therapy and can provide oral medication therapy in conjunction with the other therapies  Follow up next Office visit  for assessment of smoking cessation  Smoking cessation counseling advised for 4 minutes      Review of Systems:  Gen:  Denies  fever, sweats, chills weigh loss  HEENT: Denies blurred vision, double vision, ear pain, eye pain, hearing loss, nose bleeds, sore throat Cardiac:  No dizziness, chest pain or heaviness, chest tightness,edema, No JVD Resp:   +-shortness of breath,+wheezing, -hemoptysis,  Gi: Denies swallowing difficulty, stomach pain, nausea or vomiting, diarrhea, constipation, bowel incontinence Gu:  Denies bladder incontinence, burning urine Ext:   Denies Joint pain, stiffness or swelling Skin: Denies  skin rash, easy bruising or bleeding or hives Endoc:  Denies polyuria, polydipsia , polyphagia or weight change Psych:   Denies depression, insomnia or hallucinations  Other:  All other systems negative    ASSESSMENT / PLAN: 58 year old  end-stage COPD Gold stage D with progressive chronic hypoxic respiratory failure from his COPD in the setting of pulmonary cachexia and severe deconditioned state with ongoing tobacco abuse with underlying multiple myeloma   SOB AND DOE  Related to end stage  COPD ongoing tobacco abuse   COPD end-stage severe Gold stage D Continue inhalers as prescribed There may be a point time that he will need to switch to nebulized therapy to support his respiratory status due to severe respiratory insufficiency   Chronic Hypoxic resp failure from end stage COPD Continue oxygen as per patient uses and benefits from oxygen daily and nightly Patient needs oxygen to survive  Cough related to COPD and smoking Smoking cessation of most Continue inhalers for COPD  Tobacco abuse Smoking cessation strongly advised Patient does not tolerate Chantix or nicotine patches because it gives him nightmares  Multiple myeloma follow-up oncology as scheduled   CHRONIC PAIN Recommend follow up Hospice and Palliative care medicine  COVID-19 Education: The signs and symptoms of COVID-19 were discussed with the patient and how to seek care for testing (follow up with PCP or arrange E-visit).  The importance of social distancing was discussed today.   Patient  satisfied with Plan of action and management. All questions answered   TOTAL TIME SPENT 24 minutes   Corrin Parker, M.D.  [facility]  Medical Director [place] Director [facility] Cardio-Pulmonary Department

## 2018-05-22 NOTE — Patient Instructions (Signed)
Continue inhalers as prescribed continue oxygen as prescribed Recommend hospice referral

## 2018-05-23 ENCOUNTER — Other Ambulatory Visit: Payer: Medicaid Other

## 2018-05-23 ENCOUNTER — Ambulatory Visit: Payer: Medicaid Other | Admitting: Internal Medicine

## 2018-05-25 ENCOUNTER — Other Ambulatory Visit: Payer: Self-pay | Admitting: Internal Medicine

## 2018-05-30 ENCOUNTER — Telehealth: Payer: Self-pay | Admitting: Internal Medicine

## 2018-05-30 NOTE — Telephone Encounter (Signed)
FYI- Discussed with Dr. Forrest Moron from [facility] with the current management for multiple myeloma; recommends continued current therapy.  Dr.G is aware of patient's narcotic drug abuse/diversion.    Dr.G encourages the patient to follow-up with [place] regarding continued care for multiple myeloma.  Patient had canceled his appointment with [place]; he will need to call us to reschedule appointments.

## 2018-06-05 MED FILL — DEXAMETHASONE 4 MG TABLET: 4 | 28 days supply | Qty: 12 | Fill #4

## 2018-06-05 MED FILL — NINLARO 4 MG CAP: 4 | 28 days supply | Qty: 3 | Fill #4

## 2018-06-25 ENCOUNTER — Telehealth: Payer: Self-pay | Admitting: *Deleted

## 2018-06-25 ENCOUNTER — Ambulatory Visit (HOSPITAL_BASED_OUTPATIENT_CLINIC_OR_DEPARTMENT_OTHER): Payer: Medicaid Other | Admitting: Hospice and Palliative Medicine

## 2018-06-25 DIAGNOSIS — G893 Neoplasm related pain (acute) (chronic): Secondary | ICD-10-CM

## 2018-06-25 DIAGNOSIS — Z515 Encounter for palliative care: Secondary | ICD-10-CM

## 2018-06-25 DIAGNOSIS — C9002 Multiple myeloma in relapse: Secondary | ICD-10-CM

## 2018-06-25 NOTE — Telephone Encounter (Addendum)
Patient called reporting that he can't keep anything on his stomach, he is vomiting, he also reports uncontrollable diarrhea for a month or more Wearing depends; and lower abdominal and back pain as well as his hips. He states he cannot keep going to the [place] doc he was referred to because he wants to cut him and put a pain pump in him and he is afraid of this because of him having cancer and what happened with Santiago Glad his wife when she got cut with her cancer. He was in tears and reports that he is in such pain he has tried to get drugs from the street and cannot find any. I Advised that he go to the ER if he is that bad and he reports that he has been to ER several times in past few months. Please advise

## 2018-06-26 ENCOUNTER — Other Ambulatory Visit: Payer: Self-pay | Admitting: *Deleted

## 2018-06-26 DIAGNOSIS — C9002 Multiple myeloma in relapse: Secondary | ICD-10-CM

## 2018-06-26 NOTE — Progress Notes (Signed)
Virtual Visit via Telephone Note  I connected with Pet[place]ia on 06/26/18 at  3:00 PM EDT by telephone and verified that I am speaking with the correct person using two identifiers.   I discussed the limitations, risks, security and privacy concerns of performing an evaluation and management service by telephone and the availability of in person appointments. I also discussed with the patient that there may be a patient responsible charge related to this service. The patient expressed understanding and agreed to proceed.   History of Present Illness: Palliative Care consult requested for this57 y.o.malewith multiple medical problems including recurrent multiple myeloma with multiple PET positive bone lesions most recently treated on Ninlaro, O2 dependent COPD, diastolic dysfunction with history of CHF, atrial septal defect, and tobacco abuse.Patient was admitted to the hospital on 03/08/2018 to 03/13/2018 under involuntary commitment for agitation and hallucinations. Initially, patient was thought to have had an acute CVA on head CT but reimaging appeared consistent with artifact. AMS was thought probably related to medications. Patient has had chronic pain and anxiety and was previously managed on transdermal fentanyl and oral hydromorphone. However, patient had several inappropriate urine toxicology screens and he repeatedly violated the terms of our clinic opioid contract.  He was referred to pain management at [facility] in [facility].  Palliative care was consulted to help address goals and assist with symptom management.   Observations/Objective: Patient sent a message into our clinic that he continues to have intractable generalized pain and that he has been evaluated in the ER but they could not offer him assistance.  He has had nausea and diarrhea.  There was a comment about that he had tried to find street drugs to alleviate his pain but could not locate any.  I called and spoke with Dr. Sula Soda  at [facility].  Patient was last seen in the clinic on 05/13/2018.  There is clear evidence of addiction and concern for diversion and medication misuse.  Patient also repeatedly had inappropriate urine toxicology at the pain clinic.  A pain pump was recommended as a way to manage patient's pain but limit risk of diversion.  Patient would not consent to a pain pump.  He was subsequently started on Suboxone with the plan for weekly follow-up but refused to come back to the clinic. Dr. Sula Soda kindly agreed to follow up if patient would return to the clinic.   I then called and spoke with patient by phone.  Patient continues to endorse chronic pain that sounds unchanged from his baseline.  He has had episodes of diarrhea, which I suspect might reflect withdrawal symptoms when he is unable to find a source of opioids.  Patient does admit to taking opioids that are not prescribed.  Patient understands that we will not be prescribing opioids from our clinic.  We discussed his return to pain management but patient does not want return to [facility]. He requests a referral to [facility] in [place]. Patient says his ex-wife was recently seen at [facility] and liked the experience.  Patient says that he would consent to a pain pump if it is recommended by the physician at [facility]. Patient is also interested in mental health services at [facility]. He denies SI/HI.   Note the patient sought out a second opinion with medical oncology at [facility].  Patient says that they "had nothing to offer me."  He would like to continue receiving cancer care through Dr. Rogue Bussing.   Assessment and Plan: -Referral to [facility]  [facility]  Case and plan discussed with Dr. Rogue Bussing.   Follow Up Instructions: Will reschedule clinic visit with Dr. Rogue Bussing   I discussed the assessment and treatment plan with the patient. The patient was provided an opportunity to ask questions and all were answered. The patient agreed with  the plan and demonstrated an understanding of the instructions.   The patient was advised to call back or seek an in-person evaluation if the symptoms worsen or if the condition fails to improve as anticipated.  I provided 30 minutes of non-face-to-face time during this encounter.   Irean Hong, NP

## 2018-06-26 NOTE — Telephone Encounter (Signed)
[name] contacted the patient. See np notes

## 2018-06-28 ENCOUNTER — Telehealth: Payer: Self-pay | Admitting: *Deleted

## 2018-06-28 NOTE — Telephone Encounter (Signed)
Per v/o Billey Chang, NP - faxed referral to [facility] center - pain mgmt - [phone]

## 2018-07-02 ENCOUNTER — Other Ambulatory Visit: Payer: Self-pay | Admitting: Pharmacist

## 2018-07-02 ENCOUNTER — Other Ambulatory Visit: Payer: Self-pay | Admitting: Oncology

## 2018-07-02 ENCOUNTER — Other Ambulatory Visit: Payer: Self-pay | Admitting: Internal Medicine

## 2018-07-02 DIAGNOSIS — C9002 Multiple myeloma in relapse: Secondary | ICD-10-CM

## 2018-07-02 MED ORDER — IXAZOMIB CITRATE 4 MG PO CAPS: 4.0000 mg | ORAL_CAPSULE | ORAL | 4 refills | Status: DC

## 2018-07-02 NOTE — Telephone Encounter (Signed)
Heather- I just sent script for ninalro to [facility]. Also spoke to [employer].  We will not refill his narcotics, though.

## 2018-07-02 NOTE — Telephone Encounter (Signed)
Patient's apt is not until 07/11/2018. He has not seen our physician in the clinic due to previous transfer of care. rcvd notification last week that patient wants to transfer back to Dr. Rogue Bussing.  Dr. Jacinto Reap - do you want to hold off on refilling this script until patient is established back with you and had labs drawn?

## 2018-07-02 NOTE — Progress Notes (Signed)
I refilled Ninlaro; pending pt's appt next week in clinic.  GB

## 2018-07-04 MED FILL — NINLARO 4 MG CAP: 4 | 28 days supply | Qty: 3 | Fill #0

## 2018-07-11 ENCOUNTER — Inpatient Hospital Stay: Payer: Medicaid Other | Admitting: Internal Medicine

## 2018-07-11 ENCOUNTER — Inpatient Hospital Stay: Payer: Medicaid Other

## 2018-07-11 NOTE — Assessment & Plan Note (Deleted)
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 AUG 2019-  Bone survey shows progressive lesions in the bilateral humeri/femur. Oct 2019- PET- no lytic lesions noted.  Skeletal survey March 2020 stable.  No new lesions. STABLE.   # Currently on Ninlaro-Dex [start again 12/11]- PR noted; MARCH  2020-0.4 -Kappa light chains-slightly abnormal.  Continue Ninlaro at this time.   # ?acute stroke/ mental status changes- on anti-platelet therapy; stable.   #October 2019 PET scan Bil scattered lung nodules- up to 5 mm; will repeat imaging in May 2020. STABLE.   #Dental extractions recommended given patient's poor dentition.  History of osteonecrosis of the jaw.  # COPD wheezing- stable.  # Chronic nausea-question secondary to Ninlaro.  Continue Zofran as needed. Stable.   #Chronic pain-Long discussion with the patient/family regarding-the discrepant urine drug screen [patient has metabolites of oxycodone; and not of Dilaudid].  Patient has been prescribed Dilaudid not oxycodone.  Significant concerns about diversion of narcotics.  Discussed that I would not r refill any of patient's narcotic/or benzodiazepine prescriptions.  Will recommend evaluation at pain clinic.  Patient family agreement.  Discussed with [name] borders.  Also discussed with adult protective services.  # DISPOSITION: # pain clinic referral #Keep appt as planned/[name] appt the same day- dr.B

## 2018-07-11 NOTE — Progress Notes (Deleted)
[place] OFFICE PROGRESS NOTE  Patient Care Team: Petra Kuba, MD as PCP - General (Family Medicine)  Cancer Staging No matching staging information was found for the patient.   Oncology History Overview Note  # SEP 2015- MULTIPLE MYELOMA [multiple PET pos Bone lesions; hypercalcemia s/p BMBx; FISH- Aneuploidy - gain of chromosome 7,9,15,FGFR3/4p16.3, and CCND1/11q13. Loss of MAF/16q23.1), cytogenetics normal 46XY] s/p Vel-Dex-Rev-Zometa; Excellent PR;   # MARCH 2016- REV-DEX ; FEB 2017-[discn Dex] Rev 25 mg 3 w On & 1 w Off; March 2017- M- protein 0.4gm/dl; K/L= 7.9; cont Rev;   # Revlimid HELD June 2018 [sec to [facility]]  # AUG 2019- RECURRENCE- NINLARO-DEX  # March 2017-  Left Middle Lobe cavitary lesion- ~14m- repeat Ct in 3-486m# AUG 2017- [facility] [[name], [place] #  ? [facility]- [id number] not follow up ]- DISCONT Zometa.; NO HBO sec to   # Chronic pain/Anxiety [ortho]  # COPD/smoking/ [[facility]- not candidate for BMT- sec to co-morbidities/poor nutritional status];    # MARCH 2020-PAIN CONTRACT: BREACH-  DECLINE ANY NARCOTICS/BENZO  ---------------------------------------------------------------  DIAGNOSIS: MULTIPLE MYELOMA  STAGE: RECURRENT  ;GOALS: CONTROL  CURRENT/MOST RECENT THERAPY; AUG 2019- NINLARO-DEX    Multiple myeloma in relapse ([facility]     INTERVAL HISTORY:  [name] [age].o.  male pleasant patient above history of recurrent/relapse multiple myeloma-currently on Ninlaro is here for follow-up/review results of his skeletal survey and discussed regarding pain medication refills.  Patient has not had any hospitalizations.  He has not had any repeat strokes or any COPD exacerbation.  Continues to have chronic nausea chronic shortness of breath.  Continues to have chronic fatigue.  He continues to feel poorly.  Continues to complain of worsening joint pains back pain.  Patient has run out of fentanyl patches/Dilaudid.  Review of  Systems  Constitutional: Positive for malaise/fatigue. Negative for chills, diaphoresis and fever.  HENT: Negative for nosebleeds and sore throat.   Eyes: Negative for double vision.  Respiratory: Positive for cough, shortness of breath and wheezing. Negative for hemoptysis and sputum production.   Cardiovascular: Negative for chest pain, palpitations, orthopnea and leg swelling.  Gastrointestinal: Positive for nausea. Negative for abdominal pain, blood in stool, constipation, diarrhea, heartburn, melena and vomiting.  Genitourinary: Negative for dysuria, frequency and urgency.  Musculoskeletal: Positive for back pain (worse) and joint pain.  Skin: Negative.  Negative for itching and rash.  Neurological: Negative for dizziness, tingling, focal weakness, weakness and headaches.  Endo/Heme/Allergies: Does not bruise/bleed easily.  Psychiatric/Behavioral: Negative for depression. The patient is not nervous/anxious and does not have insomnia.       PAST MEDICAL HISTORY :  Past Medical History:  Diagnosis Date  . Anxiety   . Arthritis   . Atrial septal defect   . CHF (congestive heart failure) ([facility]  . COPD (chronic obstructive pulmonary disease) ([facility]  . Coronary artery disease   . Dysrhythmias   . GERD (gastroesophageal reflux disease)   . Hyperlipidemia   . Multiple myeloma (HCP[place]  . Multiple myeloma (HCW[place]  . Myocardial infarction (HCM[place]  . Substance abuse (HC[place]    PAST SURGICAL HISTORY :   Past Surgical History:  Procedure Laterality Date  . CARDIAC DEFIBRILLATOR PLACEMENT      FAMILY HISTORY :   Family History  Problem Relation Age of Onset  . COPD Mother   . CAD Mother   . Heart attack Father   . Prostate cancer  Maternal Grandfather   . Kidney cancer Neg Hx   . Bladder Cancer Neg Hx     SOCIAL HISTORY:   Social History   Tobacco Use  . Smoking status: Current Every Day Smoker    Packs/day: 0.50    Years: 40.00    Pack years: 20.00    Types: Cigarettes   . Smokeless tobacco: Never Used  Substance Use Topics  . Alcohol use: No    Alcohol/week: 0.0 standard drinks  . Drug use: No    ALLERGIES:  has No Known Allergies.  MEDICATIONS:  Current Outpatient Medications  Medication Sig Dispense Refill  . acetaminophen (TYLENOL) 325 MG tablet Take 2 tablets (650 mg total) by mouth every 6 (six) hours as needed for mild pain (or Fever >/= 101).    Marland Kitchen albuterol (VENTOLIN HFA) 108 (90 Base) MCG/ACT inhaler Inhale 2 puffs into the lungs every 6 (six) hours as needed for wheezing or shortness of breath. 1 Inhaler 6  . ALPRAZolam (XANAX) 0.5 MG tablet Take 1 tablet (0.5 mg total) by mouth 3 (three) times daily as needed for anxiety. 90 tablet 0  . aspirin EC 81 MG EC tablet Take 1 tablet (81 mg total) by mouth daily.    Marland Kitchen atorvastatin (LIPITOR) 20 MG tablet Take 20 mg by mouth daily.  2  . clopidogrel (PLAVIX) 75 MG tablet Take 1 tablet (75 mg total) by mouth daily. 30 tablet 0  . dexamethasone (DECADRON) 4 MG tablet TAKE 3 TABLETS (12 MG TOTAL) BY MOUTH ONCE A WEEK. TAKE WITH BREAKFAST 12 tablet 4  . digoxin (LANOXIN) 0.25 MG tablet Take 1 tablet (0.25 mg total) by mouth daily. 30 tablet 0  . DULoxetine (CYMBALTA) 30 MG capsule Take 1 capsule (30 mg total) by mouth daily. 90 capsule 3  . fentaNYL (DURAGESIC) 100 MCG/HR Place 1 patch onto the skin every 3 (three) days. Use with a 25 mcg patch for total dose of 125 mcg 5 patch 0  . fentaNYL (DURAGESIC) 25 MCG/HR Place 1 patch onto the skin every 3 (three) days. Use with a 100 mcg patch for total dose of 125 mcg 5 patch 0  . Fluticasone-Salmeterol (ADVAIR DISKUS) 250-50 MCG/DOSE AEPB Inhale 1 puff into the lungs 2 (two) times daily. 3 each 2  . gabapentin (NEURONTIN) 100 MG capsule Take 2 capsules (200 mg total) by mouth 3 (three) times daily. 180 capsule 3  . HYDROmorphone (DILAUDID) 2 MG tablet Take 1 tablet (2 mg total) by mouth every 4 (four) hours as needed for severe pain. 24 tablet 0  .  ipratropium-albuterol (DUONEB) 0.5-2.5 (3) MG/3ML SOLN Take 3 mLs by nebulization every 4 (four) hours as needed. 360 mL 3  . ixazomib citrate (NINLARO) 4 MG capsule Take 1 capsule (4 mg total) by mouth once a week. Take for 3weeks on, then 1week off. Take on an empty stomach 1hr before or 2hrs after food 3 capsule 4  . megestrol (MEGACE) 20 MG tablet TAKE ONE TABLET BY MOUTH ONCE DAILY 30 tablet 2  . metoprolol tartrate (LOPRESSOR) 25 MG tablet Take 1 tablet (25 mg total) by mouth 2 (two) times daily. 60 tablet 0  . naloxone (NARCAN) nasal spray 4 mg/0.1 mL For Opioid Overdose: Call 911 and administer a single spray of Narcan in one nostril. Repeat every 57mns as needed if no or minimal response (Patient not taking: Reported on 03/19/2018) 1 kit 2  . ondansetron (ZOFRAN) 8 MG tablet 1 pill every 8 hours as  needed. 60 tablet 3  . tamsulosin (FLOMAX) 0.4 MG CAPS capsule Take 1 capsule (0.4 mg total) by mouth daily. 30 capsule 0  . tiotropium (SPIRIVA HANDIHALER) 18 MCG inhalation capsule Place 1 capsule (18 mcg total) into inhaler and inhale daily. 30 capsule 6  . vitamin B-12 1000 MCG tablet Take 1 tablet (1,000 mcg total) by mouth daily. 30 tablet 0   No current facility-administered medications for this visit.     PHYSICAL EXAMINATION: ECOG PERFORMANCE STATUS: 2 - Symptomatic, <50% confined to bed  There were no vitals taken for this visit.  There were no vitals filed for this visit.  Physical Exam  Constitutional: He is oriented to person, place, and time.  Cachectic appearing Caucasian male patient; 2 L nasal cannula.  Accompanied by family.  He is walking by himself.  HENT:  Head: Normocephalic and atraumatic.  Mouth/Throat: Oropharynx is clear and moist. No oropharyngeal exudate.  Eyes: Pupils are equal, round, and reactive to light.  Neck: Normal range of motion. Neck supple.  Cardiovascular: Normal rate and regular rhythm.  Pulmonary/Chest: No respiratory distress. He has wheezes.   Bilateral decreased air entry.  Abdominal: Soft. Bowel sounds are normal. He exhibits no distension and no mass. There is no abdominal tenderness. There is no rebound and no guarding.  Musculoskeletal: Normal range of motion.        General: No tenderness or edema.  Neurological: He is alert and oriented to person, place, and time.  Skin: Skin is warm.  Psychiatric: Affect normal.       LABORATORY DATA:  I have reviewed the data as listed    Component Value Date/Time   NA 142 05/12/2018 1940   NA 135 05/12/2014 1343   K 3.0 (L) 05/12/2018 1940   K 3.3 (L) 05/12/2014 1343   CL 108 05/12/2018 1940   CL 101 05/12/2014 1343   CO2 26 05/12/2018 1940   CO2 27 05/12/2014 1343   GLUCOSE 94 05/12/2018 1940   GLUCOSE 77 05/12/2014 1343   BUN 9 05/12/2018 1940   BUN 9 05/12/2014 1343   CREATININE 0.62 05/12/2018 1940   CREATININE 0.89 05/12/2014 1343   CALCIUM 8.9 05/12/2018 1940   CALCIUM 8.8 (L) 05/12/2014 1343   PROT 6.5 05/12/2018 1940   PROT 5.9 (L) 02/17/2014 1405   ALBUMIN 3.8 05/12/2018 1940   ALBUMIN 2.8 (L) 02/17/2014 1405   AST 10 (L) 05/12/2018 1940   AST 9 (L) 02/17/2014 1405   ALT 7 05/12/2018 1940   ALT 12 (L) 02/17/2014 1405   ALKPHOS 31 (L) 05/12/2018 1940   ALKPHOS 44 (L) 02/17/2014 1405   BILITOT 0.6 05/12/2018 1940   BILITOT 0.2 02/17/2014 1405   GFRNONAA >60 05/12/2018 1940   GFRNONAA >60 05/12/2014 1343   GFRAA >60 05/12/2018 1940   GFRAA >60 05/12/2014 1343    No results found for: SPEP, UPEP  Lab Results  Component Value Date   WBC 9.2 05/12/2018   NEUTROABS 7.1 05/12/2018   HGB 13.2 05/12/2018   HCT 41.8 05/12/2018   MCV 87.6 05/12/2018   PLT 279 05/12/2018      Chemistry      Component Value Date/Time   NA 142 05/12/2018 1940   NA 135 05/12/2014 1343   K 3.0 (L) 05/12/2018 1940   K 3.3 (L) 05/12/2014 1343   CL 108 05/12/2018 1940   CL 101 05/12/2014 1343   CO2 26 05/12/2018 1940   CO2 27 05/12/2014 1343   BUN  9 05/12/2018 1940    BUN 9 05/12/2014 1343   CREATININE 0.62 05/12/2018 1940   CREATININE 0.89 05/12/2014 1343      Component Value Date/Time   CALCIUM 8.9 05/12/2018 1940   CALCIUM 8.8 (L) 05/12/2014 1343   ALKPHOS 31 (L) 05/12/2018 1940   ALKPHOS 44 (L) 02/17/2014 1405   AST 10 (L) 05/12/2018 1940   AST 9 (L) 02/17/2014 1405   ALT 7 05/12/2018 1940   ALT 12 (L) 02/17/2014 1405   BILITOT 0.6 05/12/2018 1940   BILITOT 0.2 02/17/2014 1405       RADIOGRAPHIC STUDIES: I have personally reviewed the radiological images as listed and agreed with the findings in the report. No results found.   ASSESSMENT & PLAN:  No problem-specific Assessment & Plan notes found for this encounter.   No orders of the defined types were placed in this encounter.  All questions were answered. The patient knows to call the clinic with any problems, questions or concerns.      Cammie Sickle, MD 07/11/2018 7:59 AM

## 2018-07-16 ENCOUNTER — Other Ambulatory Visit: Payer: Medicaid Other

## 2018-07-16 ENCOUNTER — Inpatient Hospital Stay: Payer: Medicaid Other | Admitting: Internal Medicine

## 2018-07-21 ENCOUNTER — Other Ambulatory Visit: Payer: Self-pay | Admitting: Internal Medicine

## 2018-07-21 DIAGNOSIS — C9000 Multiple myeloma not having achieved remission: Secondary | ICD-10-CM

## 2018-07-23 ENCOUNTER — Inpatient Hospital Stay: Payer: Medicaid Other | Admitting: Hospice and Palliative Medicine

## 2018-07-23 ENCOUNTER — Telehealth: Payer: Self-pay | Admitting: *Deleted

## 2018-07-23 ENCOUNTER — Other Ambulatory Visit: Payer: Self-pay

## 2018-07-23 ENCOUNTER — Inpatient Hospital Stay: Payer: Medicaid Other | Admitting: Internal Medicine

## 2018-07-23 ENCOUNTER — Inpatient Hospital Stay: Payer: Medicaid Other | Attending: Internal Medicine

## 2018-07-23 DIAGNOSIS — C9002 Multiple myeloma in relapse: Secondary | ICD-10-CM | POA: Diagnosis not present

## 2018-07-23 DIAGNOSIS — G893 Neoplasm related pain (acute) (chronic): Secondary | ICD-10-CM | POA: Insufficient documentation

## 2018-07-23 LAB — CBC WITH DIFFERENTIAL/PLATELET
Abs Immature Granulocytes: 0.03 10*3/uL (ref 0.00–0.07)
Basophils Absolute: 0 10*3/uL (ref 0.0–0.1)
Basophils Relative: 0 %
Eosinophils Absolute: 0.2 10*3/uL (ref 0.0–0.5)
Eosinophils Relative: 2 %
HCT: 42.4 % (ref 39.0–52.0)
Hemoglobin: 13.4 g/dL (ref 13.0–17.0)
Immature Granulocytes: 0 %
Lymphocytes Relative: 18 %
Lymphs Abs: 1.4 10*3/uL (ref 0.7–4.0)
MCH: 27.9 pg (ref 26.0–34.0)
MCHC: 31.6 g/dL (ref 30.0–36.0)
MCV: 88.3 fL (ref 80.0–100.0)
Monocytes Absolute: 0.8 10*3/uL (ref 0.1–1.0)
Monocytes Relative: 10 %
Neutro Abs: 5.5 10*3/uL (ref 1.7–7.7)
Neutrophils Relative %: 70 %
Platelets: 191 10*3/uL (ref 150–400)
RBC: 4.8 MIL/uL (ref 4.22–5.81)
RDW: 14.7 % (ref 11.5–15.5)
WBC: 7.9 10*3/uL (ref 4.0–10.5)
nRBC: 0 % (ref 0.0–0.2)

## 2018-07-23 LAB — COMPREHENSIVE METABOLIC PANEL
ALT: 12 U/L (ref 0–44)
AST: 20 U/L (ref 15–41)
Albumin: 3.9 g/dL (ref 3.5–5.0)
Alkaline Phosphatase: 41 U/L (ref 38–126)
Anion gap: 10 (ref 5–15)
BUN: 6 mg/dL (ref 6–20)
CO2: 32 mmol/L (ref 22–32)
Calcium: 8.9 mg/dL (ref 8.9–10.3)
Chloride: 96 mmol/L — ABNORMAL LOW (ref 98–111)
Creatinine, Ser: 0.81 mg/dL (ref 0.61–1.24)
GFR calc Af Amer: >60 mL/min (ref >60)
GFR calc non Af Amer: >60 mL/min (ref >60)
Glucose, Bld: 130 mg/dL — ABNORMAL HIGH (ref 70–99)
Potassium: 3.4 mmol/L — ABNORMAL LOW (ref 3.5–5.1)
Sodium: 138 mmol/L (ref 135–145)
Total Bilirubin: 0.4 mg/dL (ref 0.3–1.2)
Total Protein: 6.9 g/dL (ref 6.5–8.1)

## 2018-07-23 NOTE — Telephone Encounter (Signed)
Patient came out of the exam room at 1400. Pt stated that that he had to go and could not wait any longer to be seen. He had been in the clinic since 1215 pm and the wife and kids have been sitting in the car this long. He can not keep them waiting any longer. Pt notified that the provider would be with him momentarily; however, pt declined to wait. Apt with Dr. Rogue Bussing at 115 and lab apt at 1245 pm.

## 2018-07-23 NOTE — Progress Notes (Unsigned)
[facility] OFFICE PROGRESS NOTE  Patient Care Team: Petra Kuba, MD as PCP - General (Family Medicine)  Cancer Staging No matching staging information was found for the patient.   Oncology History Overview Note  # SEP 2015- MULTIPLE MYELOMA [multiple PET pos Bone lesions; hypercalcemia s/p BMBx; FISH- Aneuploidy - gain of chromosome 7,9,15,FGFR3/4p16.3, and CCND1/11q13. Loss of MAF/16q23.1), cytogenetics normal 46XY] s/p Vel-Dex-Rev-Zometa; Excellent PR;   # MARCH 2016- REV-DEX ; FEB 2017-[discn Dex] Rev 25 mg 3 w On & 1 w Off; March 2017- M- protein 0.4gm/dl; K/L= 7.9; cont Rev;   # Revlimid HELD June 2018 [sec to [facility]]  # AUG 2019- RECURRENCE- NINLARO-DEX  # March 2017-  Left Middle Lobe cavitary lesion- ~14m- repeat Ct in 3-486m# AUG 2017- [facility] [[name], [place] #  ? [facility]- [id number] not follow up ]- DISCONT Zometa.; NO HBO sec to   # Chronic pain/Anxiety [ortho]  # COPD/smoking/ [[facility]- not candidate for BMT- sec to co-morbidities/poor nutritional status];    # MARCH 2020-PAIN CONTRACT: BREACH-  DECLINE ANY NARCOTICS/BENZO  ---------------------------------------------------------------  DIAGNOSIS: MULTIPLE MYELOMA  STAGE: RECURRENT  ;GOALS: CONTROL  CURRENT/MOST RECENT THERAPY; AUG 2019- NINLARO-DEX    Multiple myeloma in relapse ([facility]     INTERVAL HISTORY:  [name] [id number].  male pleasant patient above history of recurrent/relapse multiple myeloma-currently on Ninlaro is here for follow-up/review results of his skeletal survey and discussed regarding pain medication refills.  Patient has not had any hospitalizations.  He has not had any repeat strokes or any COPD exacerbation.  Continues to have chronic nausea chronic shortness of breath.  Continues to have chronic fatigue.  He continues to feel poorly.  Continues to complain of worsening joint pains back pain.  Patient has run out of fentanyl patches/Dilaudid.  Review of  Systems  Constitutional: Positive for malaise/fatigue. Negative for chills, diaphoresis and fever.  HENT: Negative for nosebleeds and sore throat.   Eyes: Negative for double vision.  Respiratory: Positive for cough, shortness of breath and wheezing. Negative for hemoptysis and sputum production.   Cardiovascular: Negative for chest pain, palpitations, orthopnea and leg swelling.  Gastrointestinal: Positive for nausea. Negative for abdominal pain, blood in stool, constipation, diarrhea, heartburn, melena and vomiting.  Genitourinary: Negative for dysuria, frequency and urgency.  Musculoskeletal: Positive for back pain (worse) and joint pain.  Skin: Negative.  Negative for itching and rash.  Neurological: Negative for dizziness, tingling, focal weakness, weakness and headaches.  Endo/Heme/Allergies: Does not bruise/bleed easily.  Psychiatric/Behavioral: Negative for depression. The patient is not nervous/anxious and does not have insomnia.       PAST MEDICAL HISTORY :  Past Medical History:  Diagnosis Date  . Anxiety   . Arthritis   . Atrial septal defect   . CHF (congestive heart failure) ([facility]  . COPD (chronic obstructive pulmonary disease) ([facility]  . Coronary artery disease   . Dysrhythmias   . GERD (gastroesophageal reflux disease)   . Hyperlipidemia   . Multiple myeloma ([facility]  . Multiple myeloma ([facility]  . Myocardial infarction ([facility]  . Substance abuse ([facility]    PAST SURGICAL HISTORY :   Past Surgical History:  Procedure Laterality Date  . CARDIAC DEFIBRILLATOR PLACEMENT      FAMILY HISTORY :   Family History  Problem Relation Age of Onset  . COPD Mother   . CAD Mother   . Heart attack Father   . Prostate cancer  Maternal Grandfather   . Kidney cancer Neg Hx   . Bladder Cancer Neg Hx     SOCIAL HISTORY:   Social History   Tobacco Use  . Smoking status: Current Every Day Smoker    Packs/day: 0.50    Years: 40.00    Pack years: 20.00    Types: Cigarettes   . Smokeless tobacco: Never Used  Substance Use Topics  . Alcohol use: No    Alcohol/week: 0.0 standard drinks  . Drug use: No    ALLERGIES:  has No Known Allergies.  MEDICATIONS:  Current Outpatient Medications  Medication Sig Dispense Refill  . acetaminophen (TYLENOL) 325 MG tablet Take 2 tablets (650 mg total) by mouth every 6 (six) hours as needed for mild pain (or Fever >/= 101).    Marland Kitchen albuterol (VENTOLIN HFA) 108 (90 Base) MCG/ACT inhaler Inhale 2 puffs into the lungs every 6 (six) hours as needed for wheezing or shortness of breath. 1 Inhaler 6  . ALPRAZolam (XANAX) 0.5 MG tablet Take 1 tablet (0.5 mg total) by mouth 3 (three) times daily as needed for anxiety. 90 tablet 0  . aspirin EC 81 MG EC tablet Take 1 tablet (81 mg total) by mouth daily.    Marland Kitchen atorvastatin (LIPITOR) 20 MG tablet Take 20 mg by mouth daily.  2  . clopidogrel (PLAVIX) 75 MG tablet Take 1 tablet (75 mg total) by mouth daily. 30 tablet 0  . dexamethasone (DECADRON) 4 MG tablet TAKE 3 TABLETS (12 MG TOTAL) BY MOUTH ONCE A WEEK. TAKE WITH BREAKFAST 12 tablet 4  . digoxin (LANOXIN) 0.25 MG tablet Take 1 tablet (0.25 mg total) by mouth daily. 30 tablet 0  . DULoxetine (CYMBALTA) 30 MG capsule Take 1 capsule (30 mg total) by mouth daily. 90 capsule 3  . fentaNYL (DURAGESIC) 100 MCG/HR Place 1 patch onto the skin every 3 (three) days. Use with a 25 mcg patch for total dose of 125 mcg 5 patch 0  . fentaNYL (DURAGESIC) 25 MCG/HR Place 1 patch onto the skin every 3 (three) days. Use with a 100 mcg patch for total dose of 125 mcg 5 patch 0  . Fluticasone-Salmeterol (ADVAIR DISKUS) 250-50 MCG/DOSE AEPB Inhale 1 puff into the lungs 2 (two) times daily. 3 each 2  . gabapentin (NEURONTIN) 100 MG capsule TAKE TWO CAPSULES BY MOUTH THREE TIMES DAILY 180 capsule 3  . HYDROmorphone (DILAUDID) 2 MG tablet Take 1 tablet (2 mg total) by mouth every 4 (four) hours as needed for severe pain. 24 tablet 0  . ipratropium-albuterol (DUONEB)  0.5-2.5 (3) MG/3ML SOLN Take 3 mLs by nebulization every 4 (four) hours as needed. 360 mL 3  . ixazomib citrate (NINLARO) 4 MG capsule Take 1 capsule (4 mg total) by mouth once a week. Take for 3weeks on, then 1week off. Take on an empty stomach 1hr before or 2hrs after food 3 capsule 4  . megestrol (MEGACE) 20 MG tablet TAKE ONE TABLET BY MOUTH ONCE DAILY 30 tablet 2  . metoprolol tartrate (LOPRESSOR) 25 MG tablet Take 1 tablet (25 mg total) by mouth 2 (two) times daily. 60 tablet 0  . naloxone (NARCAN) nasal spray 4 mg/0.1 mL For Opioid Overdose: Call 911 and administer a single spray of Narcan in one nostril. Repeat every 49mns as needed if no or minimal response (Patient not taking: Reported on 03/19/2018) 1 kit 2  . ondansetron (ZOFRAN) 8 MG tablet 1 pill every 8 hours as needed. 60 tablet 3  .  tamsulosin (FLOMAX) 0.4 MG CAPS capsule Take 1 capsule (0.4 mg total) by mouth daily. 30 capsule 0  . tiotropium (SPIRIVA HANDIHALER) 18 MCG inhalation capsule Place 1 capsule (18 mcg total) into inhaler and inhale daily. 30 capsule 6  . vitamin B-12 1000 MCG tablet Take 1 tablet (1,000 mcg total) by mouth daily. 30 tablet 0   No current facility-administered medications for this visit.     PHYSICAL EXAMINATION: ECOG PERFORMANCE STATUS: 2 - Symptomatic, <50% confined to bed  There were no vitals taken for this visit.  There were no vitals filed for this visit.  Physical Exam  Constitutional: He is oriented to person, place, and time.  Cachectic appearing Caucasian male patient; 2 L nasal cannula.  Accompanied by family.  He is walking by himself.  HENT:  Head: Normocephalic and atraumatic.  Mouth/Throat: Oropharynx is clear and moist. No oropharyngeal exudate.  Eyes: Pupils are equal, round, and reactive to light.  Neck: Normal range of motion. Neck supple.  Cardiovascular: Normal rate and regular rhythm.  Pulmonary/Chest: No respiratory distress. He has wheezes.  Bilateral decreased air  entry.  Abdominal: Soft. Bowel sounds are normal. He exhibits no distension and no mass. There is no abdominal tenderness. There is no rebound and no guarding.  Musculoskeletal: Normal range of motion.        General: No tenderness or edema.  Neurological: He is alert and oriented to person, place, and time.  Skin: Skin is warm.  Psychiatric: Affect normal.       LABORATORY DATA:  I have reviewed the data as listed    Component Value Date/Time   NA 142 05/12/2018 1940   NA 135 05/12/2014 1343   K 3.0 (L) 05/12/2018 1940   K 3.3 (L) 05/12/2014 1343   CL 108 05/12/2018 1940   CL 101 05/12/2014 1343   CO2 26 05/12/2018 1940   CO2 27 05/12/2014 1343   GLUCOSE 94 05/12/2018 1940   GLUCOSE 77 05/12/2014 1343   BUN 9 05/12/2018 1940   BUN 9 05/12/2014 1343   CREATININE 0.62 05/12/2018 1940   CREATININE 0.89 05/12/2014 1343   CALCIUM 8.9 05/12/2018 1940   CALCIUM 8.8 (L) 05/12/2014 1343   PROT 6.5 05/12/2018 1940   PROT 5.9 (L) 02/17/2014 1405   ALBUMIN 3.8 05/12/2018 1940   ALBUMIN 2.8 (L) 02/17/2014 1405   AST 10 (L) 05/12/2018 1940   AST 9 (L) 02/17/2014 1405   ALT 7 05/12/2018 1940   ALT 12 (L) 02/17/2014 1405   ALKPHOS 31 (L) 05/12/2018 1940   ALKPHOS 44 (L) 02/17/2014 1405   BILITOT 0.6 05/12/2018 1940   BILITOT 0.2 02/17/2014 1405   GFRNONAA >60 05/12/2018 1940   GFRNONAA >60 05/12/2014 1343   GFRAA >60 05/12/2018 1940   GFRAA >60 05/12/2014 1343    No results found for: SPEP, UPEP  Lab Results  Component Value Date   WBC 9.2 05/12/2018   NEUTROABS 7.1 05/12/2018   HGB 13.2 05/12/2018   HCT 41.8 05/12/2018   MCV 87.6 05/12/2018   PLT 279 05/12/2018      Chemistry      Component Value Date/Time   NA 142 05/12/2018 1940   NA 135 05/12/2014 1343   K 3.0 (L) 05/12/2018 1940   K 3.3 (L) 05/12/2014 1343   CL 108 05/12/2018 1940   CL 101 05/12/2014 1343   CO2 26 05/12/2018 1940   CO2 27 05/12/2014 1343   BUN 9 05/12/2018 1940   BUN  9 05/12/2014 1343    CREATININE 0.62 05/12/2018 1940   CREATININE 0.89 05/12/2014 1343      Component Value Date/Time   CALCIUM 8.9 05/12/2018 1940   CALCIUM 8.8 (L) 05/12/2014 1343   ALKPHOS 31 (L) 05/12/2018 1940   ALKPHOS 44 (L) 02/17/2014 1405   AST 10 (L) 05/12/2018 1940   AST 9 (L) 02/17/2014 1405   ALT 7 05/12/2018 1940   ALT 12 (L) 02/17/2014 1405   BILITOT 0.6 05/12/2018 1940   BILITOT 0.2 02/17/2014 1405       RADIOGRAPHIC STUDIES: I have personally reviewed the radiological images as listed and agreed with the findings in the report. No results found.   ASSESSMENT & PLAN:  No problem-specific Assessment & Plan notes found for this encounter.   No orders of the defined types were placed in this encounter.  All questions were answered. The patient knows to call the clinic with any problems, questions or concerns.      Cammie Sickle, MD 07/23/2018 8:18 AM

## 2018-07-23 NOTE — Assessment & Plan Note (Signed)
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 AUG 2019-  Bone survey shows progressive lesions in the bilateral humeri/femur. Oct 2019- PET- no lytic lesions noted.  Skeletal survey March 2020 stable.  No new lesions. STABLE.   # Currently on Ninlaro-Dex [start again 12/11]- PR noted; MARCH  2020-0.4 -Kappa light chains-slightly abnormal.  Continue Ninlaro at this time.   # ?acute stroke/ mental status changes- on anti-platelet therapy; stable.   #October 2019 PET scan Bil scattered lung nodules- up to 5 mm; will repeat imaging in May 2020. STABLE.   #Dental extractions recommended given patient's poor dentition.  History of osteonecrosis of the jaw.  # COPD wheezing- stable.  # Chronic nausea-question secondary to Ninlaro.  Continue Zofran as needed. Stable.   #Chronic pain-Long discussion with the patient/family regarding-the discrepant urine drug screen [patient has metabolites of oxycodone; and not of Dilaudid].  Patient has been prescribed Dilaudid not oxycodone.  Significant concerns about diversion of narcotics.  Discussed that I would not r refill any of patient's narcotic/or benzodiazepine prescriptions.  Will recommend evaluation at pain clinic.  Patient family agreement.  Discussed with [name] borders.  Also discussed with adult protective services.  # DISPOSITION: # pain clinic referral #Keep appt as planned/[name] appt the same day- dr.B 

## 2018-07-23 NOTE — Progress Notes (Unsigned)
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  His feet and ankles are now swelling that does increase through the day.  Patient was evaluated by [facility] on 07/19/18 and was started on Buprenorphine transdermal weekly.  He states that this pain medication has been a life changer for the better.

## 2018-07-23 NOTE — Telephone Encounter (Signed)
Contact [facility] pain mgmt center. Patient has cnl his pain mgmt apts. Last apt was scheduled on 07/22/2018. He has r/s his apt to 07/30/2018 as a new patient consult.  Dr. Rogue Bussing and Billey Chang, NP made aware.

## 2018-07-24 LAB — MULTIPLE MYELOMA PANEL, SERUM
Albumin SerPl Elph-Mcnc: 3.6 g/dL (ref 2.9–4.4)
Albumin/Glob SerPl: 1.6 (ref 0.7–1.7)
Alpha 1: 0.3 g/dL (ref 0.0–0.4)
Alpha2 Glob SerPl Elph-Mcnc: 0.7 g/dL (ref 0.4–1.0)
B-Globulin SerPl Elph-Mcnc: 0.7 g/dL (ref 0.7–1.3)
Gamma Glob SerPl Elph-Mcnc: 0.7 g/dL (ref 0.4–1.8)
Globulin, Total: 2.3 g/dL (ref 2.2–3.9)
IgA: 59 mg/dL — ABNORMAL LOW (ref 90–386)
IgG (Immunoglobin G), Serum: 835 mg/dL (ref 603–1613)
IgM (Immunoglobulin M), Srm: 41 mg/dL (ref 20–172)
M Protein SerPl Elph-Mcnc: 0.3 g/dL — ABNORMAL HIGH
Total Protein ELP: 5.9 g/dL — ABNORMAL LOW (ref 6.0–8.5)

## 2018-07-24 LAB — KAPPA/LAMBDA LIGHT CHAINS
Kappa free light chain: 14.1 mg/L (ref 3.3–19.4)
Kappa, lambda light chain ratio: 2.24 — ABNORMAL HIGH (ref 0.26–1.65)
Lambda free light chains: 6.3 mg/L (ref 5.7–26.3)

## 2018-08-01 MED FILL — NINLARO 4 MG CAP: 4 | 28 days supply | Qty: 3 | Fill #1

## 2018-08-05 ENCOUNTER — Other Ambulatory Visit: Payer: Self-pay

## 2018-08-06 ENCOUNTER — Inpatient Hospital Stay (HOSPITAL_BASED_OUTPATIENT_CLINIC_OR_DEPARTMENT_OTHER): Payer: Medicaid Other | Admitting: Hospice and Palliative Medicine

## 2018-08-06 ENCOUNTER — Inpatient Hospital Stay: Payer: Medicaid Other | Attending: Internal Medicine | Admitting: Internal Medicine

## 2018-08-06 ENCOUNTER — Other Ambulatory Visit: Payer: Self-pay

## 2018-08-06 VITALS — BP 105/71 | HR 98 | Temp 96.9°F | Resp 20 | Ht 72.0 in | Wt 122.0 lb

## 2018-08-06 DIAGNOSIS — Z7982 Long term (current) use of aspirin: Secondary | ICD-10-CM | POA: Diagnosis not present

## 2018-08-06 DIAGNOSIS — F17210 Nicotine dependence, cigarettes, uncomplicated: Secondary | ICD-10-CM

## 2018-08-06 DIAGNOSIS — C9002 Multiple myeloma in relapse: Secondary | ICD-10-CM

## 2018-08-06 DIAGNOSIS — Z95810 Presence of automatic (implantable) cardiac defibrillator: Secondary | ICD-10-CM | POA: Diagnosis not present

## 2018-08-06 DIAGNOSIS — C9000 Multiple myeloma not having achieved remission: Secondary | ICD-10-CM

## 2018-08-06 DIAGNOSIS — E785 Hyperlipidemia, unspecified: Secondary | ICD-10-CM

## 2018-08-06 DIAGNOSIS — Z79899 Other long term (current) drug therapy: Secondary | ICD-10-CM | POA: Diagnosis not present

## 2018-08-06 DIAGNOSIS — Z7951 Long term (current) use of inhaled steroids: Secondary | ICD-10-CM

## 2018-08-06 DIAGNOSIS — F419 Anxiety disorder, unspecified: Secondary | ICD-10-CM

## 2018-08-06 DIAGNOSIS — Z515 Encounter for palliative care: Secondary | ICD-10-CM

## 2018-08-06 DIAGNOSIS — G893 Neoplasm related pain (acute) (chronic): Secondary | ICD-10-CM

## 2018-08-06 DIAGNOSIS — I252 Old myocardial infarction: Secondary | ICD-10-CM | POA: Diagnosis not present

## 2018-08-06 DIAGNOSIS — I509 Heart failure, unspecified: Secondary | ICD-10-CM

## 2018-08-06 DIAGNOSIS — R911 Solitary pulmonary nodule: Secondary | ICD-10-CM

## 2018-08-06 DIAGNOSIS — J449 Chronic obstructive pulmonary disease, unspecified: Secondary | ICD-10-CM

## 2018-08-06 MED ORDER — IXAZOMIB CITRATE 3 MG PO CAPS: 3.0000 mg | ORAL_CAPSULE | ORAL | 4 refills | Status: DC

## 2018-08-06 MED ORDER — GABAPENTIN 300 MG PO CAPS: 300.0000 mg | ORAL_CAPSULE | Freq: Three times a day (TID) | ORAL | 3 refills | Status: DC

## 2018-08-06 NOTE — Progress Notes (Signed)
[facility] OFFICE PROGRESS NOTE  Patient Care Team: Petra Kuba, MD as PCP - General (Family Medicine)  Cancer Staging No matching staging information was found for the patient.   Oncology History Overview Note  # SEP 2015- MULTIPLE MYELOMA [multiple PET pos Bone lesions; hypercalcemia s/p BMBx; FISH- Aneuploidy - gain of chromosome 7,9,15,FGFR3/4p16.3, and CCND1/11q13. Loss of MAF/16q23.1), cytogenetics normal 46XY] s/p Vel-Dex-Rev-Zometa; Excellent PR;   # MARCH 2016- REV-DEX ; FEB 2017-[discn Dex] Rev 25 mg 3 w On & 1 w Off; March 2017- M- protein 0.4gm/dl; K/L= 7.9; cont Rev;   # Revlimid HELD June 2018 [sec to ONJ]  # AUG 2019- RECURRENCE- NINLARO-DEX  # March 2017-  Left Middle Lobe cavitary lesion- ~79m- repeat Ct in 3-474m# AUG 2017- ONJ [[name], [place] #  ? ONJ- [phone] not follow up ]- DISCONT Zometa.; NO HBO sec to   # Chronic pain/Anxiety [ortho]  # COPD/smoking/ [[facility]- not candidate for BMT- sec to co-morbidities/poor nutritional status];    # MARCH 2020-PAIN CONTRACT: BREACH-  DECLINE ANY NARCOTICS/BENZO  ---------------------------------------------------------------  DIAGNOSIS: MULTIPLE MYELOMA  STAGE: RECURRENT  ;GOALS: CONTROL  CURRENT/MOST RECENT THERAPY; AUG 2019- NINLARO-DEX    [place]     INTERVAL HISTORY:  [name] [age].o.  male pleasant patient above history of recurrent/relapse multiple myeloma-currently on NiKennieth Rads here for follow-up.  [facility]  In the interim [place] currently on buprenorphine pain patch; he has not had any breakthrough pain medication.  States his pain is doing extremely well.  He continues to complain of worsening pain/shooting and numbness in his bilateral extremities.  Chronic shortness of breath chronic cough not any worse.  Appetite  is improved.  He is gaining weight.  Nausea is improved.  Review of Systems  Constitutional: Positive for malaise/fatigue. Negative for chills, diaphoresis and fever.  HENT: Negative for nosebleeds and sore throat.   Eyes: Negative for double vision.  Respiratory: Positive for cough, shortness of breath and wheezing. Negative for hemoptysis and sputum production.   Cardiovascular: Negative for chest pain, palpitations, orthopnea and leg swelling.  Gastrointestinal: Negative for abdominal pain, blood in stool, constipation, diarrhea, heartburn, melena and vomiting.  Genitourinary: Negative for dysuria, frequency and urgency.  Musculoskeletal: Positive for back pain (worse) and joint pain.  Skin: Negative.  Negative for itching and rash.  Neurological: Positive for tingling. Negative for dizziness, focal weakness, weakness and headaches.  Endo/Heme/Allergies: Does not bruise/bleed easily.  Psychiatric/Behavioral: Negative for depression. The patient is not nervous/anxious and does not have insomnia.       PAST MEDICAL HISTORY :  Past Medical History:  Diagnosis Date  . Anxiety   . Arthritis   . Atrial septal defect   . CHF (congestive heart failure) ([facility]  . COPD (chronic obstructive pulmonary disease) ([facility] [place]  . Coronary artery disease   . Dysrhythmias   . GERD (gastroesophageal reflux disease)   . Hyperlipidemia   . Multiple myeloma ([facility]  . Multiple myeloma ([facility]  . Myocardial infarction ([facility]  . Substance abuse ([facility]    PAST SURGICAL HISTORY :   Past Surgical History:  Procedure Laterality Date  . CARDIAC DEFIBRILLATOR PLACEMENT      FAMILY HISTORY :   Family History  Problem Relation Age of Onset  . COPD Mother   . CAD Mother   .  Heart attack Father   . Prostate cancer Maternal Grandfather   . Kidney cancer Neg Hx   . Bladder Cancer Neg Hx     SOCIAL HISTORY:   Social History   Tobacco Use  . Smoking status: Current Every Day Smoker    Packs/day: 0.50     Years: 40.00    Pack years: 20.00    Types: Cigarettes  . Smokeless tobacco: Never Used  Substance Use Topics  . Alcohol use: No    Alcohol/week: 0.0 standard drinks  . Drug use: No    ALLERGIES:  has No Known Allergies.  MEDICATIONS:  Current Outpatient Medications  Medication Sig Dispense Refill  . acetaminophen (TYLENOL) 325 MG tablet Take 2 tablets (650 mg total) by mouth every 6 (six) hours as needed for mild pain (or Fever >/= 101).    Marland Kitchen albuterol (VENTOLIN HFA) 108 (90 Base) MCG/ACT inhaler Inhale 2 puffs into the lungs every 6 (six) hours as needed for wheezing or shortness of breath. 1 Inhaler 6  . aspirin EC 81 MG EC tablet Take 1 tablet (81 mg total) by mouth daily.    Marland Kitchen atorvastatin (LIPITOR) 20 MG tablet Take 20 mg by mouth daily.  2  . buprenorphine (BUTRANS) 10 MCG/HR PTWK patch Place 1 patch onto the skin once a week.    . clopidogrel (PLAVIX) 75 MG tablet Take 1 tablet (75 mg total) by mouth daily. 30 tablet 0  . dexamethasone (DECADRON) 4 MG tablet TAKE 3 TABLETS (12 MG TOTAL) BY MOUTH ONCE A WEEK. TAKE WITH BREAKFAST 12 tablet 4  . digoxin (LANOXIN) 0.25 MG tablet Take 1 tablet (0.25 mg total) by mouth daily. 30 tablet 0  . DULoxetine (CYMBALTA) 30 MG capsule Take 1 capsule (30 mg total) by mouth daily. 90 capsule 3  . Fluticasone-Salmeterol (ADVAIR DISKUS) 250-50 MCG/DOSE AEPB Inhale 1 puff into the lungs 2 (two) times daily. 3 each 2  . ipratropium-albuterol (DUONEB) 0.5-2.5 (3) MG/3ML SOLN Take 3 mLs by nebulization every 4 (four) hours as needed. 360 mL 3  . ixazomib citrate (NINLARO) 3 MG capsule Take 1 capsule (3 mg total) by mouth once a week. Take for 3weeks on, then 1week off. Take on an empty stomach 1hr before or 2hrs after food 3 capsule 4  . metoprolol succinate (TOPROL-XL) 50 MG 24 hr tablet Take 1 tablet by mouth 2 (two) times a day.    . ondansetron (ZOFRAN) 8 MG tablet 1 pill every 8 hours as needed. 60 tablet 3  . tamsulosin (FLOMAX) 0.4 MG CAPS  capsule Take 1 capsule (0.4 mg total) by mouth daily. 30 capsule 0  . tiotropium (SPIRIVA HANDIHALER) 18 MCG inhalation capsule Place 1 capsule (18 mcg total) into inhaler and inhale daily. 30 capsule 6  . vitamin B-12 1000 MCG tablet Take 1 tablet (1,000 mcg total) by mouth daily. 30 tablet 0  . gabapentin (NEURONTIN) 300 MG capsule Take 1 capsule (300 mg total) by mouth 3 (three) times daily. 90 capsule 3  . naloxone (NARCAN) nasal spray 4 mg/0.1 mL For Opioid Overdose: Call 911 and administer a single spray of Narcan in one nostril. Repeat every 63mns as needed if no or minimal response (Patient not taking: Reported on 08/06/2018) 1 kit 2   No current facility-administered medications for this visit.     PHYSICAL EXAMINATION: ECOG PERFORMANCE STATUS: 2 - Symptomatic, <50% confined to bed  BP 105/71   Pulse 98   Temp (!) 96.9 F (36.1 C) (  Tympanic)   Resp 20   Ht 6' (1.829 m)   Wt 122 lb (55.3 kg)   SpO2 98%   BMI 16.55 kg/m   Filed Weights   08/06/18 1344  Weight: 122 lb (55.3 kg)    Physical Exam  Constitutional: He is oriented to person, place, and time.  Cachectic appearing Caucasian male patient; 2 L nasal cannula.  He is alone.  He is walking by himself.  HENT:  Head: Normocephalic and atraumatic.  Mouth/Throat: Oropharynx is clear and moist. No oropharyngeal exudate.  Eyes: Pupils are equal, round, and reactive to light.  Neck: Normal range of motion. Neck supple.  Cardiovascular: Normal rate and regular rhythm.  Pulmonary/Chest: No respiratory distress. He has wheezes.  Bilateral decreased air entry.  Abdominal: Soft. Bowel sounds are normal. He exhibits no distension and no mass. There is no abdominal tenderness. There is no rebound and no guarding.  Musculoskeletal: Normal range of motion.        General: No tenderness or edema.  Neurological: He is alert and oriented to person, place, and time.  Skin: Skin is warm.  Psychiatric: Affect normal.        LABORATORY DATA:  I have reviewed the data as listed    Component Value Date/Time   NA 138 07/23/2018 1252   NA 135 05/12/2014 1343   K 3.4 (L) 07/23/2018 1252   K 3.3 (L) 05/12/2014 1343   CL 96 (L) 07/23/2018 1252   CL 101 05/12/2014 1343   CO2 32 07/23/2018 1252   CO2 27 05/12/2014 1343   GLUCOSE 130 (H) 07/23/2018 1252   GLUCOSE 77 05/12/2014 1343   BUN 6 07/23/2018 1252   BUN 9 05/12/2014 1343   CREATININE 0.81 07/23/2018 1252   CREATININE 0.89 05/12/2014 1343   CALCIUM 8.9 07/23/2018 1252   CALCIUM 8.8 (L) 05/12/2014 1343   PROT 6.9 07/23/2018 1252   PROT 5.9 (L) 02/17/2014 1405   ALBUMIN 3.9 07/23/2018 1252   ALBUMIN 2.8 (L) 02/17/2014 1405   AST 20 07/23/2018 1252   AST 9 (L) 02/17/2014 1405   ALT 12 07/23/2018 1252   ALT 12 (L) 02/17/2014 1405   ALKPHOS 41 07/23/2018 1252   ALKPHOS 44 (L) 02/17/2014 1405   BILITOT 0.4 07/23/2018 1252   BILITOT 0.2 02/17/2014 1405   GFRNONAA >60 07/23/2018 1252   GFRNONAA >60 05/12/2014 1343   GFRAA >60 07/23/2018 1252   GFRAA >60 05/12/2014 1343    No results found for: SPEP, UPEP  Lab Results  Component Value Date   WBC 7.9 07/23/2018   NEUTROABS 5.5 07/23/2018   HGB 13.4 07/23/2018   HCT 42.4 07/23/2018   MCV 88.3 07/23/2018   PLT 191 07/23/2018      Chemistry      Component Value Date/Time   NA 138 07/23/2018 1252   NA 135 05/12/2014 1343   K 3.4 (L) 07/23/2018 1252   K 3.3 (L) 05/12/2014 1343   CL 96 (L) 07/23/2018 1252   CL 101 05/12/2014 1343   CO2 32 07/23/2018 1252   CO2 27 05/12/2014 1343   BUN 6 07/23/2018 1252   BUN 9 05/12/2014 1343   CREATININE 0.81 07/23/2018 1252   CREATININE 0.89 05/12/2014 1343      Component Value Date/Time   CALCIUM 8.9 07/23/2018 1252   CALCIUM 8.8 (L) 05/12/2014 1343   ALKPHOS 41 07/23/2018 1252   ALKPHOS 44 (L) 02/17/2014 1405   AST 20 07/23/2018 1252   AST 9 (  L) 02/17/2014 1405   ALT 12 07/23/2018 1252   ALT 12 (L) 02/17/2014 1405   BILITOT 0.4  07/23/2018 1252   BILITOT 0.2 02/17/2014 1405       RADIOGRAPHIC STUDIES: I have personally reviewed the radiological images as listed and agreed with the findings in the report. No results found.   ASSESSMENT & PLAN:  Multiple myeloma in relapse ([place]) # MULTIPLE MYELOMA-AUG 2019 recurrent;  AUG 2019-  Bone survey shows progressive lesions in the bilateral humeri/femur. Oct 2019- PET- no lytic lesions noted.  Clinically stable.  # Currently on Ninlaro-Dex [start again 12/11]- PR noted; June 2020 0.3 -Kappa light chains-slightly abnormal.  Continue Ninlaro-Dex weekly.  Tolerating Ninlaro-well except for diarrhea/see below  # Diarrhea- 3-4 loose stools ? ninlaro /on immodium; next time will reduce dose to 3 mg.  New prescription will be sent.  # ?acute stroke/ mental status changes- on anti-platelet therapy; stable.   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  # COPD- advair/spiriva- Stable.   # Chronic nausea-question secondary to Ninlaro.  Continue Zofran as needed. Stable.   #Chronic pain-currently on buprenorphine pain patch once a week.  Clinically stable; followed by pain clinic.  #Neuropathy-slightly worse recommend increasing the gabapentin to 300 3 times daily.  # DISPOSITION: # follow up in 4 weeks- cbc/cmp/MMpanel/K-L light chain- CT chest prior- Dr.B   Orders Placed This Encounter  Procedures  . CT CHEST WO CONTRAST    Standing Status:   Future    Standing Expiration Date:   08/06/2019    Order Specific Question:   Preferred imaging location?    Answer:   [facility]    Order Specific Question:   Radiology Contrast Protocol - do NOT remove file path    Answer:   [email or web address]    Order Specific Question:   ** REASON FOR EXAM (FREE TEXT)    Answer:   lung nodule- right middle lobe  . CBC with Differential     Standing Status:   Future    Standing Expiration Date:   08/06/2019  . Comprehensive metabolic panel    Standing Status:   Future    Standing Expiration Date:   08/06/2019  . Multiple Myeloma Panel (SPEP&IFE w/QIG)    Standing Status:   Future    Standing Expiration Date:   08/06/2019  . Kappa/lambda light chains    Standing Status:   Future    Standing Expiration Date:   08/06/2019   All questions were answered. The patient knows to call the clinic with any problems, questions or concerns.      Cammie Sickle, MD 08/06/2018 2:26 PM

## 2018-08-06 NOTE — Assessment & Plan Note (Addendum)
#  MULTIPLE MYELOMA-AUG 2019 recurrent;  AUG 2019-  Bone survey shows progressive lesions in the bilateral humeri/femur. Oct 2019- PET- no lytic lesions noted.  Clinically stable.  # Currently on Ninlaro-Dex [start again 12/11]- PR noted; June 2020 0.3 -Kappa light chains-slightly abnormal.  Continue Ninlaro-Dex weekly.  Tolerating Ninlaro-well except for diarrhea/see below  # Diarrhea- 3-4 loose stools ? ninlaro /on immodium; next time will reduce dose to 3 mg.  New prescription will be sent.  # ?acute stroke/ mental status changes- on anti-platelet therapy; stable.   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  # COPD- advair/spiriva- Stable.   # Chronic nausea-question secondary to Ninlaro.  Continue Zofran as needed. Stable.   #Chronic pain-currently on buprenorphine pain patch once a week.  Clinically stable; followed by pain clinic.  #Neuropathy-slightly worse recommend increasing the gabapentin to 300 3 times daily.  # DISPOSITION: # follow up in 4 weeks- cbc/cmp/MMpanel/K-L light chain- CT chest prior- Dr.B

## 2018-08-06 NOTE — Progress Notes (Signed)
[facility]  Telephone:([phone] [phone]   Name: [name] Date: 08/06/2018 MRN: [id number]  DOB: Dec 19, 1960  Patient Care Team: Petra Kuba, MD as PCP - General (Family Medicine)    REASON FOR CONSULTATION: Palliative Care consult requested for this57 y.o.malewith multiple medical problems including recurrent multiple myeloma with multiple PET positive bone lesions most recently treated on Ninlaro, O2 dependent COPD, diastolic dysfunction with history of CHF, atrial septal defect, and tobacco abuse.Patient was admitted to the hospital on 03/08/2018 to 03/13/2018 under involuntary commitment for agitation and hallucinations. Initially, patient was thought to have had an acute CVA on head CT but reimaging appeared consistent with artifact. AMS was thought probably related to medications.  Patient has had chronic pain and anxiety on fentanyl and hydromorphone.Palliative care was consulted to help address goals and assist with symptom management.   SOCIAL HISTORY:     reports that he has been smoking cigarettes. He has a 20.00 pack-year smoking history. He has never used smokeless tobacco. He reports that he does not drink alcohol or use drugs.   Patient is not married but lives at home with a significant other and several unrelated friends.  He has 2 sons and a daughter but reportedly is estranged from children.  ADVANCE DIRECTIVES:  Patient's significant other is his healthcare power of attorney. ACP documents not on file.   CODE STATUS: Limited code (MOST form completed on 04/02/18)  PAST MEDICAL HISTORY: Past Medical History:  Diagnosis Date   Anxiety    Arthritis    Atrial septal defect    CHF (congestive heart failure) (HCC)    COPD (chronic obstructive pulmonary disease) ([place])    Coronary artery disease    Dysrhythmias    GERD (gastroesophageal reflux disease)    Hyperlipidemia     Multiple myeloma (HCC)    Multiple myeloma (HCC)    Myocardial infarction ([place])    Substance abuse ([place])     PAST SURGICAL HISTORY:  Past Surgical History:  Procedure Laterality Date   CARDIAC DEFIBRILLATOR PLACEMENT      HEMATOLOGY/ONCOLOGY HISTORY:  Oncology History Overview Note  # SEP 2015- MULTIPLE MYELOMA [multiple PET pos Bone lesions; hypercalcemia s/p BMBx; FISH- Aneuploidy - gain of chromosome 7,9,15,FGFR3/4p16.3, and CCND1/11q13. Loss of MAF/16q23.1), cytogenetics normal 46XY] s/p Vel-Dex-Rev-Zometa; Excellent PR;   # MARCH 2016- REV-DEX ; FEB 2017-[discn Dex] Rev 25 mg 3 w On & 1 w Off; March 2017- M- protein 0.4gm/dl; K/L= 7.9; cont Rev;   # Revlimid HELD June 2018 [sec to ONJ]  # AUG 2019- RECURRENCE- NINLARO-DEX  # March 2017-  Left Middle Lobe cavitary lesion- ~42m- repeat Ct in 3-452m# AUG 2017- ONJ [[name], [place] #  ? ONJ- [phone] not follow up ]- DISCONT Zometa.; NO HBO sec to   # Chronic pain/Anxiety [ortho]  # COPD/smoking/ [[facility]- not candidate for BMT- sec to co-morbidities/poor nutritional status];    # MARCH 2020-PAIN CONTRACT: BREACH-  DECLINE ANY NARCOTICS/BENZO  ---------------------------------------------------------------  DIAGNOSIS: MULTIPLE MYELOMA  STAGE: RECURRENT  ;GOALS: CONTROL  CURRENT/MOST RECENT THERAPY; AUG 2019- NINLARO-DEX    [place] ([facility]   ALLERGIES:  has No Known Allergies.  MEDICATIONS:  Current Outpatient Medications  Medication Sig Dispense Refill   acetaminophen (TYLENOL) 325 MG tablet Take 2 tablets (650 mg total) by mouth every 6 (six) hours as needed for mild pain (or Fever >/= 101).     albuterol (VENTOLIN HFA)  108 (90 Base) MCG/ACT inhaler Inhale 2 puffs into the lungs every 6 (six) hours as needed for wheezing or shortness of breath. 1 Inhaler 6   aspirin EC 81 MG EC tablet Take 1 tablet (81 mg total) by mouth daily.     atorvastatin (LIPITOR) 20 MG tablet Take 20 mg  by mouth daily.  2   buprenorphine (BUTRANS) 10 MCG/HR PTWK patch Place 1 patch onto the skin once a week.     clopidogrel (PLAVIX) 75 MG tablet Take 1 tablet (75 mg total) by mouth daily. 30 tablet 0   dexamethasone (DECADRON) 4 MG tablet TAKE 3 TABLETS (12 MG TOTAL) BY MOUTH ONCE A WEEK. TAKE WITH BREAKFAST 12 tablet 4   digoxin (LANOXIN) 0.25 MG tablet Take 1 tablet (0.25 mg total) by mouth daily. 30 tablet 0   DULoxetine (CYMBALTA) 30 MG capsule Take 1 capsule (30 mg total) by mouth daily. 90 capsule 3   Fluticasone-Salmeterol (ADVAIR DISKUS) 250-50 MCG/DOSE AEPB Inhale 1 puff into the lungs 2 (two) times daily. 3 each 2   gabapentin (NEURONTIN) 300 MG capsule Take 1 capsule (300 mg total) by mouth 3 (three) times daily. 90 capsule 3   ipratropium-albuterol (DUONEB) 0.5-2.5 (3) MG/3ML SOLN Take 3 mLs by nebulization every 4 (four) hours as needed. 360 mL 3   ixazomib citrate (NINLARO) 3 MG capsule Take 1 capsule (3 mg total) by mouth once a week. Take for 3weeks on, then 1week off. Take on an empty stomach 1hr before or 2hrs after food 3 capsule 4   metoprolol succinate (TOPROL-XL) 50 MG 24 hr tablet Take 1 tablet by mouth 2 (two) times a day.     naloxone (NARCAN) nasal spray 4 mg/0.1 mL For Opioid Overdose: Call 911 and administer a single spray of Narcan in one nostril. Repeat every 28mns as needed if no or minimal response (Patient not taking: Reported on 08/06/2018) 1 kit 2   ondansetron (ZOFRAN) 8 MG tablet 1 pill every 8 hours as needed. 60 tablet 3   tamsulosin (FLOMAX) 0.4 MG CAPS capsule Take 1 capsule (0.4 mg total) by mouth daily. 30 capsule 0   tiotropium (SPIRIVA HANDIHALER) 18 MCG inhalation capsule Place 1 capsule (18 mcg total) into inhaler and inhale daily. 30 capsule 6   vitamin B-12 1000 MCG tablet Take 1 tablet (1,000 mcg total) by mouth daily. 30 tablet 0   No current facility-administered medications for this visit.     VITAL SIGNS: There were no vitals  taken for this visit. There were no vitals filed for this visit.  Estimated body mass index is 16.55 kg/m as calculated from the following:   Height as of an earlier encounter on 08/06/18: 6' (1.829 m).   Weight as of an earlier encounter on 08/06/18: 122 lb (55.3 kg).  LABS: CBC:    Component Value Date/Time   WBC 7.9 07/23/2018 1252   HGB 13.4 07/23/2018 1252   HGB 10.6 (L) 05/12/2014 1343   HCT 42.4 07/23/2018 1252   HCT 30.9 (L) 05/12/2014 1343   PLT 191 07/23/2018 1252   PLT 258 05/12/2014 1343   MCV 88.3 07/23/2018 1252   MCV 92 05/12/2014 1343   NEUTROABS 5.5 07/23/2018 1252   NEUTROABS 3.6 05/12/2014 1343   LYMPHSABS 1.4 07/23/2018 1252   LYMPHSABS 1.2 05/12/2014 1343   MONOABS 0.8 07/23/2018 1252   MONOABS 0.7 05/12/2014 1343   EOSABS 0.2 07/23/2018 1252   EOSABS 0.2 05/12/2014 1343   BASOSABS 0.0 07/23/2018  1252   BASOSABS 0.1 05/12/2014 1343   Comprehensive Metabolic Panel:    Component Value Date/Time   NA 138 07/23/2018 1252   NA 135 05/12/2014 1343   K 3.4 (L) 07/23/2018 1252   K 3.3 (L) 05/12/2014 1343   CL 96 (L) 07/23/2018 1252   CL 101 05/12/2014 1343   CO2 32 07/23/2018 1252   CO2 27 05/12/2014 1343   BUN 6 07/23/2018 1252   BUN 9 05/12/2014 1343   CREATININE 0.81 07/23/2018 1252   CREATININE 0.89 05/12/2014 1343   GLUCOSE 130 (H) 07/23/2018 1252   GLUCOSE 77 05/12/2014 1343   CALCIUM 8.9 07/23/2018 1252   CALCIUM 8.8 (L) 05/12/2014 1343   AST 20 07/23/2018 1252   AST 9 (L) 02/17/2014 1405   ALT 12 07/23/2018 1252   ALT 12 (L) 02/17/2014 1405   ALKPHOS 41 07/23/2018 1252   ALKPHOS 44 (L) 02/17/2014 1405   BILITOT 0.4 07/23/2018 1252   BILITOT 0.2 02/17/2014 1405   PROT 6.9 07/23/2018 1252   PROT 5.9 (L) 02/17/2014 1405   ALBUMIN 3.9 07/23/2018 1252   ALBUMIN 2.8 (L) 02/17/2014 1405    RADIOGRAPHIC STUDIES: No results found.  PERFORMANCE STATUS (ECOG) : 1 - Symptomatic but completely ambulatory  Review of Systems Unless otherwise  noted, a complete review of systems is negative.  Physical Exam General: NAD, frail appearing, thin Pulmonary: Unlabored, on O2 via nasal cannula Extremities: no edema, no joint deformities Skin: no rashes Neurological: Weakness but otherwise nonfocal  IMPRESSION: Routine follow-up visit today in the clinic.  Patient seems to be doing quite well.  He says that his pain is stable on Butrans patch.  He is now being followed at [facility] pain clinic in [place].  He says that his functioning and quality of life has significantly improved on current pain regimen.  Patient is eating well.  His has actually gained weight and is up 6 pounds over the past month to current weight of 122 pounds.  Patient does have continued diarrhea, which is possibly related to his Ninlaro.  Patient denies other distressing symptoms.  He reports sleeping well.  Moods are well controlled and he denies depression.  He does have continued neuropathy in his feet and toes.  Discussed with Dr. Rogue Bussing, who plans to increase his gabapentin to 300 mg 3 times daily.  Patient is also on Cymbalta and could dose adjust that as needed.  PLAN: -Continue current scope of treatment -Patient followed by pain clinic -Agree with increased dose of gabapentin to 300 mg 3 times daily -Continue Cymbalta -RTC in 1 month   Patient expressed understanding and was in agreement with this plan. He also understands that He can call the clinic at any time with any questions, concerns, or complaints.     Time Total: 15 minutes  Visit consisted of counseling and education dealing with the complex and emotionally intense issues of symptom management and palliative care in the setting of serious and potentially life-threatening illness.Greater than 50%  of this time was spent counseling and coordinating care related to the above assessment and plan.  Signed by: Altha Harm, PhD, NP-C [phone] (Work Cell)

## 2018-08-11 ENCOUNTER — Other Ambulatory Visit: Payer: Self-pay | Admitting: Internal Medicine

## 2018-08-21 ENCOUNTER — Ambulatory Visit
Admission: RE | Admit: 2018-08-21 | Discharge: 2018-08-21 | Disposition: A | Discharge status: Home / Self Care | Payer: Medicaid Other | Source: Ambulatory Visit | Attending: Internal Medicine | Admitting: Internal Medicine

## 2018-08-21 ENCOUNTER — Other Ambulatory Visit: Payer: Self-pay

## 2018-08-21 DIAGNOSIS — R911 Solitary pulmonary nodule: Secondary | ICD-10-CM | POA: Insufficient documentation

## 2018-08-21 DIAGNOSIS — C9002 Multiple myeloma in relapse: Secondary | ICD-10-CM | POA: Diagnosis not present

## 2018-08-29 MED FILL — NINLARO 3 MG CAPSULE: 3 | 28 days supply | Qty: 3 | Fill #0

## 2018-09-03 ENCOUNTER — Inpatient Hospital Stay: Payer: Medicaid Other | Attending: Internal Medicine

## 2018-09-03 ENCOUNTER — Inpatient Hospital Stay (HOSPITAL_BASED_OUTPATIENT_CLINIC_OR_DEPARTMENT_OTHER): Payer: Medicaid Other | Admitting: Hospice and Palliative Medicine

## 2018-09-03 ENCOUNTER — Inpatient Hospital Stay (HOSPITAL_BASED_OUTPATIENT_CLINIC_OR_DEPARTMENT_OTHER): Payer: Medicaid Other | Admitting: Internal Medicine

## 2018-09-03 ENCOUNTER — Other Ambulatory Visit: Payer: Self-pay

## 2018-09-03 ENCOUNTER — Encounter: Payer: Self-pay | Admitting: Internal Medicine

## 2018-09-03 DIAGNOSIS — C9002 Multiple myeloma in relapse: Secondary | ICD-10-CM | POA: Insufficient documentation

## 2018-09-03 DIAGNOSIS — Z7982 Long term (current) use of aspirin: Secondary | ICD-10-CM | POA: Insufficient documentation

## 2018-09-03 DIAGNOSIS — C9000 Multiple myeloma not having achieved remission: Secondary | ICD-10-CM

## 2018-09-03 DIAGNOSIS — F17210 Nicotine dependence, cigarettes, uncomplicated: Secondary | ICD-10-CM | POA: Insufficient documentation

## 2018-09-03 DIAGNOSIS — Z515 Encounter for palliative care: Secondary | ICD-10-CM

## 2018-09-03 DIAGNOSIS — Z79899 Other long term (current) drug therapy: Secondary | ICD-10-CM | POA: Diagnosis not present

## 2018-09-03 DIAGNOSIS — Q211 Atrial septal defect: Secondary | ICD-10-CM | POA: Insufficient documentation

## 2018-09-03 DIAGNOSIS — J449 Chronic obstructive pulmonary disease, unspecified: Secondary | ICD-10-CM | POA: Insufficient documentation

## 2018-09-03 DIAGNOSIS — Z9981 Dependence on supplemental oxygen: Secondary | ICD-10-CM | POA: Diagnosis not present

## 2018-09-03 LAB — CBC WITH DIFFERENTIAL/PLATELET
Abs Immature Granulocytes: 0.07 10*3/uL (ref 0.00–0.07)
Basophils Absolute: 0 10*3/uL (ref 0.0–0.1)
Basophils Relative: 0 %
Eosinophils Absolute: 0 10*3/uL (ref 0.0–0.5)
Eosinophils Relative: 0 %
HCT: 39.9 % (ref 39.0–52.0)
Hemoglobin: 12.8 g/dL — ABNORMAL LOW (ref 13.0–17.0)
Immature Granulocytes: 0 %
Lymphocytes Relative: 2 %
Lymphs Abs: 0.4 10*3/uL — ABNORMAL LOW (ref 0.7–4.0)
MCH: 28.6 pg (ref 26.0–34.0)
MCHC: 32.1 g/dL (ref 30.0–36.0)
MCV: 89.3 fL (ref 80.0–100.0)
Monocytes Absolute: 0.2 10*3/uL (ref 0.1–1.0)
Monocytes Relative: 1 %
Neutro Abs: 18.3 10*3/uL — ABNORMAL HIGH (ref 1.7–7.7)
Neutrophils Relative %: 97 %
Platelets: 284 10*3/uL (ref 150–400)
RBC: 4.47 MIL/uL (ref 4.22–5.81)
RDW: 14.5 % (ref 11.5–15.5)
WBC: 19.1 10*3/uL — ABNORMAL HIGH (ref 4.0–10.5)
nRBC: 0 % (ref 0.0–0.2)

## 2018-09-03 LAB — COMPREHENSIVE METABOLIC PANEL
ALT: 10 U/L (ref 0–44)
AST: 17 U/L (ref 15–41)
Albumin: 4 g/dL (ref 3.5–5.0)
Alkaline Phosphatase: 44 U/L (ref 38–126)
Anion gap: 8 (ref 5–15)
BUN: 11 mg/dL (ref 6–20)
CO2: 29 mmol/L (ref 22–32)
Calcium: 8.8 mg/dL — ABNORMAL LOW (ref 8.9–10.3)
Chloride: 101 mmol/L (ref 98–111)
Creatinine, Ser: 1.04 mg/dL (ref 0.61–1.24)
GFR calc Af Amer: >60 mL/min (ref >60)
GFR calc non Af Amer: >60 mL/min (ref >60)
Glucose, Bld: 107 mg/dL — ABNORMAL HIGH (ref 70–99)
Potassium: 4.1 mmol/L (ref 3.5–5.1)
Sodium: 138 mmol/L (ref 135–145)
Total Bilirubin: 0.6 mg/dL (ref 0.3–1.2)
Total Protein: 6.9 g/dL (ref 6.5–8.1)

## 2018-09-03 NOTE — Progress Notes (Signed)
[place]  Telephone:([phone] [phone]   Name: [name] Date: 09/03/2018 MRN: [id number]  DOB: 10-12-1960  Patient Care Team: Petra Kuba, MD as PCP - General (Family Medicine)    REASON FOR CONSULTATION: Palliative Care consult requested for this57 y.o.malewith multiple medical problems including recurrent multiple myeloma with multiple PET positive bone lesions most recently treated on Ninlaro, O2 dependent COPD, diastolic dysfunction with history of CHF, atrial septal defect, and tobacco abuse.Patient was admitted to the hospital on 03/08/2018 to 03/13/2018 under involuntary commitment for agitation and hallucinations. Initially, patient was thought to have had an acute CVA on head CT but reimaging appeared consistent with artifact. AMS was thought probably related to medications.  Patient has had chronic pain and anxiety on fentanyl and hydromorphone.Palliative care was consulted to help address goals and assist with symptom management.   SOCIAL HISTORY:     reports that he has been smoking cigarettes. He has a 20.00 pack-year smoking history. He has never used smokeless tobacco. He reports that he does not drink alcohol or use drugs.   Patient is not married but lives at home with a significant other and several unrelated friends.  He has 2 sons and a daughter but reportedly is estranged from children.  ADVANCE DIRECTIVES:  Patient's significant other is his healthcare power of attorney. ACP documents not on file.   CODE STATUS: Limited code (MOST form completed on 04/02/18)  PAST MEDICAL HISTORY: Past Medical History:  Diagnosis Date   Anxiety    Arthritis    Atrial septal defect    CHF (congestive heart failure) (HCC)    COPD (chronic obstructive pulmonary disease) ([place])    Coronary artery disease    Dysrhythmias    GERD (gastroesophageal reflux disease)    Hyperlipidemia     Multiple myeloma (HCC)    Multiple myeloma (HCC)    Myocardial infarction ([place])    Substance abuse ([place])     PAST SURGICAL HISTORY:  Past Surgical History:  Procedure Laterality Date   CARDIAC DEFIBRILLATOR PLACEMENT      HEMATOLOGY/ONCOLOGY HISTORY:  Oncology History Overview Note  # SEP 2015- MULTIPLE MYELOMA [multiple PET pos Bone lesions; hypercalcemia s/p BMBx; FISH- Aneuploidy - gain of chromosome 7,9,15,FGFR3/4p16.3, and CCND1/11q13. Loss of MAF/16q23.1), cytogenetics normal 46XY] s/p Vel-Dex-Rev-Zometa; Excellent PR;   # MARCH 2016- REV-DEX ; FEB 2017-[discn Dex] Rev 25 mg 3 w On & 1 w Off; March 2017- M- protein 0.4gm/dl; K/L= 7.9; cont Rev;   # Revlimid HELD June 2018 [sec to ONJ]  # AUG 2019- RECURRENCE- NINLARO-DEX  # March 2017-  Left Middle Lobe cavitary lesion- ~66m- repeat Ct in 3-457m# AUG 2017- ONJ [[name], [place] #  ? ONJ- [phone] not follow up ]- DISCONT Zometa.; NO HBO sec to   # Chronic pain/Anxiety [ortho]  # COPD/smoking/ [[facility]- not candidate for BMT- sec to co-morbidities/poor nutritional status];    # MARCH 2020-PAIN CONTRACT: BREACH-  DECLINE ANY NARCOTICS/BENZO  ---------------------------------------------------------------  DIAGNOSIS: MULTIPLE MYELOMA  STAGE: RECURRENT  ;GOALS: CONTROL  CURRENT/MOST RECENT THERAPY; AUG 2019- NINLARO-DEX    [place]   ALLERGIES:  has No Known Allergies.  MEDICATIONS:  Current Outpatient Medications  Medication Sig Dispense Refill   acetaminophen (TYLENOL) 325 MG tablet Take 2 tablets (650 mg total) by mouth every 6 (six) hours as needed for mild pain (or Fever >/= 101). (Patient not taking: Reported on 09/03/2018)  albuterol (VENTOLIN HFA) 108 (90 Base) MCG/ACT inhaler Inhale 2 puffs into the lungs every 6 (six) hours as needed for wheezing or shortness of breath. (Patient not taking: Reported on 09/03/2018) 1 Inhaler 6   aspirin EC 81 MG EC tablet Take 1  tablet (81 mg total) by mouth daily.     atorvastatin (LIPITOR) 20 MG tablet Take 20 mg by mouth daily.  2   buprenorphine (BUTRANS) 10 MCG/HR PTWK patch Place 1 patch onto the skin once a week.     clopidogrel (PLAVIX) 75 MG tablet Take 1 tablet (75 mg total) by mouth daily. 30 tablet 0   dexamethasone (DECADRON) 4 MG tablet TAKE 3 TABLETS (12 MG TOTAL) BY MOUTH ONCE A WEEK. TAKE WITH BREAKFAST 12 tablet 4   digoxin (LANOXIN) 0.25 MG tablet Take 1 tablet (0.25 mg total) by mouth daily. 30 tablet 0   DULoxetine (CYMBALTA) 30 MG capsule Take 1 capsule (30 mg total) by mouth daily. 90 capsule 3   Fluticasone-Salmeterol (ADVAIR DISKUS) 250-50 MCG/DOSE AEPB Inhale 1 puff into the lungs 2 (two) times daily. 3 each 2   gabapentin (NEURONTIN) 300 MG capsule Take 1 capsule (300 mg total) by mouth 3 (three) times daily. 90 capsule 3   ipratropium-albuterol (DUONEB) 0.5-2.5 (3) MG/3ML SOLN Take 3 mLs by nebulization every 4 (four) hours as needed. 360 mL 3   ixazomib citrate (NINLARO) 3 MG capsule Take 1 capsule (3 mg total) by mouth once a week. Take for 3weeks on, then 1week off. Take on an empty stomach 1hr before or 2hrs after food 3 capsule 4   metoprolol succinate (TOPROL-XL) 50 MG 24 hr tablet Take 1 tablet by mouth 2 (two) times a day.     naloxone (NARCAN) nasal spray 4 mg/0.1 mL For Opioid Overdose: Call 911 and administer a single spray of Narcan in one nostril. Repeat every 46mns as needed if no or minimal response (Patient not taking: Reported on 08/06/2018) 1 kit 2   ondansetron (ZOFRAN) 8 MG tablet TAKE ONE TABLET BY MOUTH EVERY 8 HOURS AS NEEDED 60 tablet 3   tamsulosin (FLOMAX) 0.4 MG CAPS capsule Take 1 capsule (0.4 mg total) by mouth daily. 30 capsule 0   tiotropium (SPIRIVA HANDIHALER) 18 MCG inhalation capsule Place 1 capsule (18 mcg total) into inhaler and inhale daily. 30 capsule 6   vitamin B-12 1000 MCG tablet Take 1 tablet (1,000 mcg total) by mouth daily. 30 tablet 0     No current facility-administered medications for this visit.     VITAL SIGNS: There were no vitals taken for this visit. There were no vitals filed for this visit.  Estimated body mass index is 15.62 kg/m as calculated from the following:   Height as of an earlier encounter on 09/03/18: 6' (1.829 m).   Weight as of an earlier encounter on 09/03/18: 115 lb 3.2 oz (52.3 kg).  LABS: CBC:    Component Value Date/Time   WBC 19.1 (H) 09/03/2018 1423   HGB 12.8 (L) 09/03/2018 1423   HGB 10.6 (L) 05/12/2014 1343   HCT 39.9 09/03/2018 1423   HCT 30.9 (L) 05/12/2014 1343   PLT 284 09/03/2018 1423   PLT 258 05/12/2014 1343   MCV 89.3 09/03/2018 1423   MCV 92 05/12/2014 1343   NEUTROABS 18.3 (H) 09/03/2018 1423   NEUTROABS 3.6 05/12/2014 1343   LYMPHSABS 0.4 (L) 09/03/2018 1423   LYMPHSABS 1.2 05/12/2014 1343   MONOABS 0.2 09/03/2018 1423   MONOABS 0.7  05/12/2014 1343   EOSABS 0.0 09/03/2018 1423   EOSABS 0.2 05/12/2014 1343   BASOSABS 0.0 09/03/2018 1423   BASOSABS 0.1 05/12/2014 1343   Comprehensive Metabolic Panel:    Component Value Date/Time   NA 138 09/03/2018 1423   NA 135 05/12/2014 1343   K 4.1 09/03/2018 1423   K 3.3 (L) 05/12/2014 1343   CL 101 09/03/2018 1423   CL 101 05/12/2014 1343   CO2 29 09/03/2018 1423   CO2 27 05/12/2014 1343   BUN 11 09/03/2018 1423   BUN 9 05/12/2014 1343   CREATININE 1.04 09/03/2018 1423   CREATININE 0.89 05/12/2014 1343   GLUCOSE 107 (H) 09/03/2018 1423   GLUCOSE 77 05/12/2014 1343   CALCIUM 8.8 (L) 09/03/2018 1423   CALCIUM 8.8 (L) 05/12/2014 1343   AST 17 09/03/2018 1423   AST 9 (L) 02/17/2014 1405   ALT 10 09/03/2018 1423   ALT 12 (L) 02/17/2014 1405   ALKPHOS 44 09/03/2018 1423   ALKPHOS 44 (L) 02/17/2014 1405   BILITOT 0.6 09/03/2018 1423   BILITOT 0.2 02/17/2014 1405   PROT 6.9 09/03/2018 1423   PROT 5.9 (L) 02/17/2014 1405   ALBUMIN 4.0 09/03/2018 1423   ALBUMIN 2.8 (L) 02/17/2014 1405    RADIOGRAPHIC  STUDIES: Ct Chest Wo Contrast  Result Date: 08/22/2018 CLINICAL DATA:  Followup pulmonary nodule EXAM: CT CHEST WITHOUT CONTRAST TECHNIQUE: Multidetector CT imaging of the chest was performed following the standard protocol without IV contrast. COMPARISON:  CT chest 04/05/2015 FINDINGS: Cardiovascular: Heart size is normal. No pericardial effusion. Aortic atherosclerosis. Calcification in the LAD, left circumflex and RCA coronary arteries noted. Mediastinum/Nodes: Normal appearance of the thyroid gland. The trachea appears patent and is midline. Normal appearance of the esophagus. No enlarged mediastinal or hilar lymph nodes. Lungs/Pleura: Moderate change of emphysema. No pleural effusion identified. Diffuse bronchial wall thickening. Mild cylindrical bronchiectasis identified bilaterally. Multiple less than 5 mm lung nodules are scattered throughout both lungs. Most of these were present on study from 2017 compatible with a benign process. Several of these are new however including a 4 mm nodule within the lingula, image 102/4. Also new is a 3 mm nodule within the anterior left upper lobe. Upper Abdomen: No acute abnormality. Musculoskeletal: Diffuse osteopenia with diffuse lucent lesions throughout the visualized thoracic skeleton. Compression deformities are identified involving T6, T9, T12, L1, and L3. These are similar to previous exam. The similar appearance of distal body of sternum fracture. IMPRESSION: 1. No acute cardiopulmonary abnormalities. 2. Scattered small, less than 5 mm pulmonary nodules. Most of these are unchanged. There are 2 new nodules identified the largest measures 5 mm. No follow-up needed if patient is low-risk (and has no known or suspected primary neoplasm). Non-contrast chest CT can be considered in 12 months if patient is high-risk. This recommendation follows the consensus statement: Guidelines for Management of Incidental Pulmonary Nodules Detected on CT Images: From the  Fleischner Society 2017; Radiology 2017; 284:228-243. 3. Aortic Atherosclerosis (ICD10-I70.0) and Emphysema (ICD10-J43.9). Coronary artery calcifications 4. Diffuse osteopenia with multiple lucent lesions throughout the bony thorax compatible with the history of myeloma. Electronically Signed   By: Kerby Moors M.D.   On: 08/22/2018 09:17    PERFORMANCE STATUS (ECOG) : 1 - Symptomatic but completely ambulatory  Review of Systems Unless otherwise noted, a complete review of systems is negative.  Physical Exam General: NAD, frail appearing, thin Pulmonary: Unlabored, on O2 via nasal cannula Extremities: no edema, no joint deformities Skin: no  rashes Neurological: Weakness but otherwise nonfocal  IMPRESSION: Routine follow-up visit today in the clinic.    Patient seems to be doing reasonably well.  He denies any changes or concerns.  He reports his pain is well controlled and he continues to receive care at the pain clinic at [facility], [place].  Patient reports a stable home environment after his daughter-in-law left some months back.  He says things have been much calmer and he blames her for stealing his pain medications.  Patient says he eats well but he continues to lose weight.  His weight was up to 122 pounds in July but is back down to 115 pounds today.  He does drink Boost once a day.  Discussed increasing supplements to twice or 3 times daily.  Patient is interested in speaking with the dietitian.  PLAN: -Continue current scope of treatment -Patient followed by pain clinic -Referral to RD -RTC in 1 month   Patient expressed understanding and was in agreement with this plan. He also understands that He can call the clinic at any time with any questions, concerns, or complaints.     Time Total: 15 minutes  Visit consisted of counseling and education dealing with the complex and emotionally intense issues of symptom management and palliative care in the setting of  serious and potentially life-threatening illness.Greater than 50%  of this time was spent counseling and coordinating care related to the above assessment and plan.  Signed by: Altha Harm, PhD, NP-C [phone] (Work Cell)

## 2018-09-03 NOTE — Progress Notes (Signed)
Patient c/o lower abdomen cramping x 2 weeks

## 2018-09-03 NOTE — Progress Notes (Signed)
[place] OFFICE PROGRESS NOTE  Patient Care Team: Petra Kuba, MD as PCP - General (Family Medicine)  Cancer Staging No matching staging information was found for the patient.   Oncology History Overview Note  # SEP 2015- MULTIPLE MYELOMA [multiple PET pos Bone lesions; hypercalcemia s/p BMBx; FISH- Aneuploidy - gain of chromosome 7,9,15,FGFR3/4p16.3, and CCND1/11q13. Loss of MAF/16q23.1), cytogenetics normal 46XY] s/p Vel-Dex-Rev-Zometa; Excellent PR;   # MARCH 2016- REV-DEX ; FEB 2017-[discn Dex] Rev 25 mg 3 w On & 1 w Off; March 2017- M- protein 0.4gm/dl; K/L= 7.9; cont Rev;   # Revlimid HELD June 2018 [sec to ONJ]  # AUG 2019- RECURRENCE- NINLARO-DEX  # March 2017-  Left Middle Lobe cavitary lesion- ~4m- repeat Ct in 3-460m# AUG 2017- ONJ [[name], [place] #  ? ONJ- [phone] not follow up ]- DISCONT Zometa.; NO HBO sec to   # Chronic pain/Anxiety [ortho]  # COPD/smoking/ [[facility]- not candidate for BMT- sec to co-morbidities/poor nutritional status];    # MARCH 2020-PAIN CONTRACT: BREACH-  DECLINE ANY NARCOTICS/BENZO  ---------------------------------------------------------------  DIAGNOSIS: MULTIPLE MYELOMA  STAGE: RECURRENT  ;GOALS: CONTROL  CURRENT/MOST RECENT THERAPY; AUG 2019- NINLARO-DEX    [place]     INTERVAL HISTORY:  [name] [age].o.  male pleasant patient above history of recurrent/relapse multiple myeloma-currently on Ninlaro is here for follow-up/review results CT scan.  Approximate 2 weeks ago patient noted to have intermittent abdominal pain.  Currently resolved.   His chronic back pain joint pains are stable.  He continues to be on buprenorphine pain patch.  He is not needing any breakthrough pain medication.  Has chronic shortness of breath chronic cough.  Not any worse.  Appetite is improved.  No weight loss.  Chronic mild nausea stable.   Review of Systems  Constitutional:  Positive for malaise/fatigue. Negative for chills, diaphoresis and fever.  HENT: Negative for nosebleeds and sore throat.   Eyes: Negative for double vision.  Respiratory: Positive for cough, shortness of breath and wheezing. Negative for hemoptysis and sputum production.   Cardiovascular: Negative for chest pain, palpitations, orthopnea and leg swelling.  Gastrointestinal: Negative for abdominal pain, blood in stool, constipation, diarrhea, heartburn, melena and vomiting.  Genitourinary: Negative for dysuria, frequency and urgency.  Musculoskeletal: Positive for back pain and joint pain.  Skin: Negative.  Negative for itching and rash.  Neurological: Positive for tingling. Negative for dizziness, focal weakness, weakness and headaches.  Endo/Heme/Allergies: Does not bruise/bleed easily.  Psychiatric/Behavioral: Negative for depression. The patient is not nervous/anxious and does not have insomnia.       PAST MEDICAL HISTORY :  Past Medical History:  Diagnosis Date  . Anxiety   . Arthritis   . Atrial septal defect   . CHF (congestive heart failure) ([facility]  . COPD (chronic obstructive pulmonary disease) ([facility]  . Coronary artery disease   . Dysrhythmias   . GERD (gastroesophageal reflux disease)   . Hyperlipidemia   . Multiple myeloma ([facility]  . Multiple myeloma ([facility]  . Myocardial infarction ([facility]  . Substance abuse ([facility]    PAST SURGICAL HISTORY :   Past Surgical History:  Procedure Laterality Date  . CARDIAC DEFIBRILLATOR PLACEMENT      FAMILY HISTORY :   Family History  Problem Relation Age of Onset  . COPD Mother   . CAD Mother   . Heart attack Father   . Prostate cancer  Maternal Grandfather   . Kidney cancer Neg Hx   . Bladder Cancer Neg Hx     SOCIAL HISTORY:   Social History   Tobacco Use  . Smoking status: Current Every Day Smoker    Packs/day: 0.50    Years: 40.00    Pack years: 20.00    Types: Cigarettes  . Smokeless tobacco: Never Used   Substance Use Topics  . Alcohol use: No    Alcohol/week: 0.0 standard drinks  . Drug use: No    ALLERGIES:  has No Known Allergies.  MEDICATIONS:  Current Outpatient Medications  Medication Sig Dispense Refill  . aspirin EC 81 MG EC tablet Take 1 tablet (81 mg total) by mouth daily.    Marland Kitchen atorvastatin (LIPITOR) 20 MG tablet Take 20 mg by mouth daily.  2  . buprenorphine (BUTRANS) 10 MCG/HR PTWK patch Place 1 patch onto the skin once a week.    . clopidogrel (PLAVIX) 75 MG tablet Take 1 tablet (75 mg total) by mouth daily. 30 tablet 0  . dexamethasone (DECADRON) 4 MG tablet TAKE 3 TABLETS (12 MG TOTAL) BY MOUTH ONCE A WEEK. TAKE WITH BREAKFAST 12 tablet 4  . digoxin (LANOXIN) 0.25 MG tablet Take 1 tablet (0.25 mg total) by mouth daily. 30 tablet 0  . DULoxetine (CYMBALTA) 30 MG capsule Take 1 capsule (30 mg total) by mouth daily. 90 capsule 3  . Fluticasone-Salmeterol (ADVAIR DISKUS) 250-50 MCG/DOSE AEPB Inhale 1 puff into the lungs 2 (two) times daily. 3 each 2  . gabapentin (NEURONTIN) 300 MG capsule Take 1 capsule (300 mg total) by mouth 3 (three) times daily. 90 capsule 3  . ipratropium-albuterol (DUONEB) 0.5-2.5 (3) MG/3ML SOLN Take 3 mLs by nebulization every 4 (four) hours as needed. 360 mL 3  . ixazomib citrate (NINLARO) 3 MG capsule Take 1 capsule (3 mg total) by mouth once a week. Take for 3weeks on, then 1week off. Take on an empty stomach 1hr before or 2hrs after food 3 capsule 4  . metoprolol succinate (TOPROL-XL) 50 MG 24 hr tablet Take 1 tablet by mouth 2 (two) times a day.    . ondansetron (ZOFRAN) 8 MG tablet TAKE ONE TABLET BY MOUTH EVERY 8 HOURS AS NEEDED 60 tablet 3  . tamsulosin (FLOMAX) 0.4 MG CAPS capsule Take 1 capsule (0.4 mg total) by mouth daily. 30 capsule 0  . tiotropium (SPIRIVA HANDIHALER) 18 MCG inhalation capsule Place 1 capsule (18 mcg total) into inhaler and inhale daily. 30 capsule 6  . vitamin B-12 1000 MCG tablet Take 1 tablet (1,000 mcg total) by mouth  daily. 30 tablet 0  . acetaminophen (TYLENOL) 325 MG tablet Take 2 tablets (650 mg total) by mouth every 6 (six) hours as needed for mild pain (or Fever >/= 101). (Patient not taking: Reported on 09/03/2018)    . albuterol (VENTOLIN HFA) 108 (90 Base) MCG/ACT inhaler Inhale 2 puffs into the lungs every 6 (six) hours as needed for wheezing or shortness of breath. (Patient not taking: Reported on 09/03/2018) 1 Inhaler 6  . naloxone (NARCAN) nasal spray 4 mg/0.1 mL For Opioid Overdose: Call 911 and administer a single spray of Narcan in one nostril. Repeat every 30mns as needed if no or minimal response (Patient not taking: Reported on 08/06/2018) 1 kit 2   No current facility-administered medications for this visit.     PHYSICAL EXAMINATION: ECOG PERFORMANCE STATUS: 2 - Symptomatic, <50% confined to bed  BP (!) 85/61 (BP Location: Right Arm,  Patient Position: Sitting)   Pulse 94   Temp (!) 97.5 F (36.4 C) (Tympanic)   Resp 18   Ht 6' (1.829 m)   Wt 115 lb 3.2 oz (52.3 kg)   BMI 15.62 kg/m   Filed Weights   09/03/18 1442  Weight: 115 lb 3.2 oz (52.3 kg)    Physical Exam  Constitutional: He is oriented to person, place, and time.  Cachectic appearing Caucasian male patient; 2 L nasal cannula.  He is alone.  He is walking by himself.  HENT:  Head: Normocephalic and atraumatic.  Mouth/Throat: Oropharynx is clear and moist. No oropharyngeal exudate.  Eyes: Pupils are equal, round, and reactive to light.  Neck: Normal range of motion. Neck supple.  Cardiovascular: Normal rate and regular rhythm.  Pulmonary/Chest: No respiratory distress. He has wheezes.  Bilateral decreased air entry.  Abdominal: Soft. Bowel sounds are normal. He exhibits no distension and no mass. There is no abdominal tenderness. There is no rebound and no guarding.  Musculoskeletal: Normal range of motion.        General: No tenderness or edema.  Neurological: He is alert and oriented to person, place, and time.   Skin: Skin is warm.  Psychiatric: Affect normal.       LABORATORY DATA:  I have reviewed the data as listed    Component Value Date/Time   NA 138 09/03/2018 1423   NA 135 05/12/2014 1343   K 4.1 09/03/2018 1423   K 3.3 (L) 05/12/2014 1343   CL 101 09/03/2018 1423   CL 101 05/12/2014 1343   CO2 29 09/03/2018 1423   CO2 27 05/12/2014 1343   GLUCOSE 107 (H) 09/03/2018 1423   GLUCOSE 77 05/12/2014 1343   BUN 11 09/03/2018 1423   BUN 9 05/12/2014 1343   CREATININE 1.04 09/03/2018 1423   CREATININE 0.89 05/12/2014 1343   CALCIUM 8.8 (L) 09/03/2018 1423   CALCIUM 8.8 (L) 05/12/2014 1343   PROT 6.9 09/03/2018 1423   PROT 5.9 (L) 02/17/2014 1405   ALBUMIN 4.0 09/03/2018 1423   ALBUMIN 2.8 (L) 02/17/2014 1405   AST 17 09/03/2018 1423   AST 9 (L) 02/17/2014 1405   ALT 10 09/03/2018 1423   ALT 12 (L) 02/17/2014 1405   ALKPHOS 44 09/03/2018 1423   ALKPHOS 44 (L) 02/17/2014 1405   BILITOT 0.6 09/03/2018 1423   BILITOT 0.2 02/17/2014 1405   GFRNONAA >60 09/03/2018 1423   GFRNONAA >60 05/12/2014 1343   GFRAA >60 09/03/2018 1423   GFRAA >60 05/12/2014 1343    No results found for: SPEP, UPEP  Lab Results  Component Value Date   WBC 19.1 (H) 09/03/2018   NEUTROABS 18.3 (H) 09/03/2018   HGB 12.8 (L) 09/03/2018   HCT 39.9 09/03/2018   MCV 89.3 09/03/2018   PLT 284 09/03/2018      Chemistry      Component Value Date/Time   NA 138 09/03/2018 1423   NA 135 05/12/2014 1343   K 4.1 09/03/2018 1423   K 3.3 (L) 05/12/2014 1343   CL 101 09/03/2018 1423   CL 101 05/12/2014 1343   CO2 29 09/03/2018 1423   CO2 27 05/12/2014 1343   BUN 11 09/03/2018 1423   BUN 9 05/12/2014 1343   CREATININE 1.04 09/03/2018 1423   CREATININE 0.89 05/12/2014 1343      Component Value Date/Time   CALCIUM 8.8 (L) 09/03/2018 1423   CALCIUM 8.8 (L) 05/12/2014 1343   ALKPHOS 44 09/03/2018 1423  ALKPHOS 44 (L) 02/17/2014 1405   AST 17 09/03/2018 1423   AST 9 (L) 02/17/2014 1405   ALT 10  09/03/2018 1423   ALT 12 (L) 02/17/2014 1405   BILITOT 0.6 09/03/2018 1423   BILITOT 0.2 02/17/2014 1405       RADIOGRAPHIC STUDIES: I have personally reviewed the radiological images as listed and agreed with the findings in the report. No results found.   ASSESSMENT & PLAN:  Multiple myeloma in relapse ([place]) # MULTIPLE MYELOMA-AUG 2019 recurrent;  AUG 2019-  Bone survey shows progressive lesions in the bilateral humeri/femur. Oct 2019- PET- no lytic lesions noted.  Clinically stable.  # Currently on Ninlaro-Dex [start again 12/11]- PR noted; June 2020 0.3 -Kappa light chains-slightly abnormal.  Continue Ninlaro-Dex weekly.    # Continue Nillaro 53m weekly with Dex  # ?acute stroke/ mental status changes- on anti-platelet therapy; stable.  #October 2019 PET scan Bil scattered lung nodules- up to 5 mm; AUG 2020- CT overall stable; will repeat in 6-12 months.   # COPD- advair/spiriva-stable  # Chronic nausea-question secondary to Ninlaro.  Continue Zofran as needed.  Stable  #Chronic pain-currently on buprenorphine pain patch once a week.  Stable.  #Neuropathy- STABLE;  gabapentin to 300 3 times daily.  # DISPOSITION: # follow up in 4 weeks- cbc/cmp/MMpanel/K-L light chain-- Dr.B   Orders Placed This Encounter  Procedures  . CBC with Differential    Standing Status:   Future    Standing Expiration Date:   09/03/2019  . Comprehensive metabolic panel    Standing Status:   Future    Standing Expiration Date:   09/03/2019  . Multiple Myeloma Panel (SPEP&IFE w/QIG)    Standing Status:   Future    Standing Expiration Date:   09/03/2019  . Kappa/lambda light chains    Standing Status:   Future    Standing Expiration Date:   09/03/2019   All questions were answered. The patient knows to call the clinic with any problems, questions or concerns.      GCammie Sickle MD 09/09/2018 4:52 PM

## 2018-09-03 NOTE — Assessment & Plan Note (Addendum)
#  MULTIPLE MYELOMA-AUG 2019 recurrent;  AUG 2019-  Bone survey shows progressive lesions in the bilateral humeri/femur. Oct 2019- PET- no lytic lesions noted.  Clinically stable.  # Currently on Ninlaro-Dex [start again 12/11]- PR noted; June 2020 0.3 -Kappa light chains-slightly abnormal.  Continue Ninlaro-Dex weekly.    # Continue Nillaro '3mg'$  weekly with Dex  # ?acute stroke/ mental status changes- on anti-platelet therapy; stable.  #October 2019 PET scan Bil scattered lung nodules- up to 5 mm; AUG 2020- CT overall stable; will repeat in 6-12 months.   # COPD- advair/spiriva-stable  # Chronic nausea-question secondary to Ninlaro.  Continue Zofran as needed.  Stable  #Chronic pain-currently on buprenorphine pain patch once a week.  Stable.  #Neuropathy- STABLE;  gabapentin to 300 3 times daily.  # DISPOSITION: # follow up in 4 weeks- cbc/cmp/MMpanel/K-L light chain-- Dr.B

## 2018-09-04 LAB — MULTIPLE MYELOMA PANEL, SERUM
Albumin SerPl Elph-Mcnc: 3.7 g/dL (ref 2.9–4.4)
Albumin/Glob SerPl: 1.6 (ref 0.7–1.7)
Alpha 1: 0.2 g/dL (ref 0.0–0.4)
Alpha2 Glob SerPl Elph-Mcnc: 0.8 g/dL (ref 0.4–1.0)
B-Globulin SerPl Elph-Mcnc: 0.7 g/dL (ref 0.7–1.3)
Gamma Glob SerPl Elph-Mcnc: 0.7 g/dL (ref 0.4–1.8)
Globulin, Total: 2.4 g/dL (ref 2.2–3.9)
IgA: 57 mg/dL — ABNORMAL LOW (ref 90–386)
IgG (Immunoglobin G), Serum: 818 mg/dL (ref 603–1613)
IgM (Immunoglobulin M), Srm: 39 mg/dL (ref 20–172)
M Protein SerPl Elph-Mcnc: 0.3 g/dL — ABNORMAL HIGH
Total Protein ELP: 6.1 g/dL (ref 6.0–8.5)

## 2018-09-04 LAB — KAPPA/LAMBDA LIGHT CHAINS
Kappa free light chain: 13.9 mg/L (ref 3.3–19.4)
Kappa, lambda light chain ratio: 3.02 — ABNORMAL HIGH (ref 0.26–1.65)
Lambda free light chains: 4.6 mg/L — ABNORMAL LOW (ref 5.7–26.3)

## 2018-09-12 ENCOUNTER — Telehealth: Payer: Self-pay

## 2018-09-12 NOTE — Telephone Encounter (Signed)
Nutrition  Patient referred to RD from Billey Chang, NP for weight loss.  Called patient this am.  No answer.  Left message with call back number.   [name] B. Zenia Resides, [place], [place] Registered Dietitian [phone] (pager)

## 2018-09-26 ENCOUNTER — Telehealth: Payer: Self-pay

## 2018-09-26 MED FILL — NINLARO 3 MG CAPSULE: 3 | 28 days supply | Qty: 3 | Fill #1

## 2018-09-26 NOTE — Telephone Encounter (Signed)
Nutrition  Have not received call back from message left on 09/12/2018.    Called patient again this am.  No answer.  Left message with call back number on voicemail.   Dovber Ernest B. Zenia Resides, [place], [place] Registered Dietitian [phone] (pager)

## 2018-10-04 ENCOUNTER — Inpatient Hospital Stay: Payer: Medicaid Other | Admitting: Hospice and Palliative Medicine

## 2018-10-04 ENCOUNTER — Inpatient Hospital Stay: Payer: Medicaid Other

## 2018-10-04 ENCOUNTER — Inpatient Hospital Stay: Payer: Medicaid Other | Admitting: Internal Medicine

## 2018-10-07 ENCOUNTER — Telehealth: Payer: Self-pay

## 2018-10-07 NOTE — Telephone Encounter (Signed)
Nutrition  3rd attempt to call patient for nutrition assessment via phone.  No answer.  Left message with call back number on voicemail.  Maryjo Ragon B. Zenia Resides, [place], [place] Registered Dietitian [phone] (pager)

## 2018-10-08 ENCOUNTER — Inpatient Hospital Stay (HOSPITAL_BASED_OUTPATIENT_CLINIC_OR_DEPARTMENT_OTHER): Payer: Medicaid Other | Admitting: Hospice and Palliative Medicine

## 2018-10-08 DIAGNOSIS — Z515 Encounter for palliative care: Secondary | ICD-10-CM

## 2018-10-08 NOTE — Progress Notes (Signed)
Telephone visit was scheduled for today. I tried several times to call patient at the scheduled time but the phone went straight to voicemail. Message left.

## 2018-10-09 ENCOUNTER — Other Ambulatory Visit: Payer: Self-pay

## 2018-10-09 ENCOUNTER — Encounter: Payer: Self-pay | Admitting: Internal Medicine

## 2018-10-09 NOTE — Progress Notes (Signed)
Patient stated that he had been doing well with no complaints. 

## 2018-10-10 ENCOUNTER — Inpatient Hospital Stay (HOSPITAL_BASED_OUTPATIENT_CLINIC_OR_DEPARTMENT_OTHER): Payer: Medicaid Other | Admitting: Hospice and Palliative Medicine

## 2018-10-10 ENCOUNTER — Other Ambulatory Visit: Payer: Self-pay

## 2018-10-10 ENCOUNTER — Inpatient Hospital Stay (HOSPITAL_BASED_OUTPATIENT_CLINIC_OR_DEPARTMENT_OTHER): Payer: Medicaid Other | Admitting: Internal Medicine

## 2018-10-10 ENCOUNTER — Inpatient Hospital Stay: Payer: Medicaid Other | Attending: Internal Medicine

## 2018-10-10 DIAGNOSIS — C9002 Multiple myeloma in relapse: Secondary | ICD-10-CM | POA: Insufficient documentation

## 2018-10-10 DIAGNOSIS — Z79899 Other long term (current) drug therapy: Secondary | ICD-10-CM | POA: Diagnosis not present

## 2018-10-10 DIAGNOSIS — F17210 Nicotine dependence, cigarettes, uncomplicated: Secondary | ICD-10-CM | POA: Insufficient documentation

## 2018-10-10 DIAGNOSIS — Z95810 Presence of automatic (implantable) cardiac defibrillator: Secondary | ICD-10-CM | POA: Diagnosis not present

## 2018-10-10 DIAGNOSIS — G8929 Other chronic pain: Secondary | ICD-10-CM | POA: Diagnosis not present

## 2018-10-10 DIAGNOSIS — Z7982 Long term (current) use of aspirin: Secondary | ICD-10-CM | POA: Insufficient documentation

## 2018-10-10 DIAGNOSIS — I252 Old myocardial infarction: Secondary | ICD-10-CM | POA: Insufficient documentation

## 2018-10-10 DIAGNOSIS — F419 Anxiety disorder, unspecified: Secondary | ICD-10-CM | POA: Diagnosis not present

## 2018-10-10 DIAGNOSIS — J449 Chronic obstructive pulmonary disease, unspecified: Secondary | ICD-10-CM | POA: Insufficient documentation

## 2018-10-10 DIAGNOSIS — E785 Hyperlipidemia, unspecified: Secondary | ICD-10-CM | POA: Diagnosis not present

## 2018-10-10 DIAGNOSIS — Z515 Encounter for palliative care: Secondary | ICD-10-CM | POA: Diagnosis not present

## 2018-10-10 DIAGNOSIS — Z7951 Long term (current) use of inhaled steroids: Secondary | ICD-10-CM | POA: Diagnosis not present

## 2018-10-10 DIAGNOSIS — R110 Nausea: Secondary | ICD-10-CM | POA: Diagnosis not present

## 2018-10-10 DIAGNOSIS — G629 Polyneuropathy, unspecified: Secondary | ICD-10-CM | POA: Diagnosis not present

## 2018-10-10 LAB — CBC WITH DIFFERENTIAL/PLATELET
Abs Immature Granulocytes: 0.04 10*3/uL (ref 0.00–0.07)
Basophils Absolute: 0 10*3/uL (ref 0.0–0.1)
Basophils Relative: 0 %
Eosinophils Absolute: 0.1 10*3/uL (ref 0.0–0.5)
Eosinophils Relative: 1 %
HCT: 39.6 % (ref 39.0–52.0)
Hemoglobin: 12.5 g/dL — ABNORMAL LOW (ref 13.0–17.0)
Immature Granulocytes: 1 %
Lymphocytes Relative: 24 %
Lymphs Abs: 2 10*3/uL (ref 0.7–4.0)
MCH: 28.9 pg (ref 26.0–34.0)
MCHC: 31.6 g/dL (ref 30.0–36.0)
MCV: 91.7 fL (ref 80.0–100.0)
Monocytes Absolute: 0.8 10*3/uL (ref 0.1–1.0)
Monocytes Relative: 9 %
Neutro Abs: 5.4 10*3/uL (ref 1.7–7.7)
Neutrophils Relative %: 65 %
Platelets: 194 10*3/uL (ref 150–400)
RBC: 4.32 MIL/uL (ref 4.22–5.81)
RDW: 13.9 % (ref 11.5–15.5)
WBC: 8.3 10*3/uL (ref 4.0–10.5)
nRBC: 0 % (ref 0.0–0.2)

## 2018-10-10 LAB — COMPREHENSIVE METABOLIC PANEL
ALT: 8 U/L (ref 0–44)
AST: 16 U/L (ref 15–41)
Albumin: 4 g/dL (ref 3.5–5.0)
Alkaline Phosphatase: 36 U/L — ABNORMAL LOW (ref 38–126)
Anion gap: 8 (ref 5–15)
BUN: 6 mg/dL (ref 6–20)
CO2: 29 mmol/L (ref 22–32)
Calcium: 8.8 mg/dL — ABNORMAL LOW (ref 8.9–10.3)
Chloride: 102 mmol/L (ref 98–111)
Creatinine, Ser: 0.96 mg/dL (ref 0.61–1.24)
GFR calc Af Amer: >60 mL/min (ref >60)
GFR calc non Af Amer: >60 mL/min (ref >60)
Glucose, Bld: 102 mg/dL — ABNORMAL HIGH (ref 70–99)
Potassium: 3.4 mmol/L — ABNORMAL LOW (ref 3.5–5.1)
Sodium: 139 mmol/L (ref 135–145)
Total Bilirubin: 0.4 mg/dL (ref 0.3–1.2)
Total Protein: 6.7 g/dL (ref 6.5–8.1)

## 2018-10-10 NOTE — Progress Notes (Signed)
[place]  Telephone:([phone] [phone]   Name: [name] Date: 10/10/2018 MRN: [id number]  DOB: 1960-07-21  Patient Care Team: Petra Kuba, MD as PCP - General (Family Medicine)    REASON FOR CONSULTATION: Palliative Care consult requested for this57 y.o.malewith multiple medical problems including recurrent multiple myeloma with multiple PET positive bone lesions most recently treated on Ninlaro, O2 dependent COPD, diastolic dysfunction with history of CHF, atrial septal defect, and tobacco abuse.Patient was admitted to the hospital on 03/08/2018 to 03/13/2018 under involuntary commitment for agitation and hallucinations. Initially, patient was thought to have had an acute CVA on head CT but reimaging appeared consistent with artifact. AMS was thought probably related to medications.  Patient has had chronic pain and anxiety on fentanyl and hydromorphone.Palliative care was consulted to help address goals and assist with symptom management.   SOCIAL HISTORY:     reports that he has been smoking cigarettes. He has a 20.00 pack-year smoking history. He has never used smokeless tobacco. He reports that he does not drink alcohol or use drugs.   Patient is not married but lives at home with a significant other and several unrelated friends.  He has 2 sons and a daughter but reportedly is estranged from children.  ADVANCE DIRECTIVES:  Patient's significant other is his healthcare power of attorney. ACP documents not on file.   CODE STATUS: Limited code (MOST form completed on 04/02/18)  PAST MEDICAL HISTORY: Past Medical History:  Diagnosis Date  . Anxiety   . Arthritis   . Atrial septal defect   . CHF (congestive heart failure) ([place])   . COPD (chronic obstructive pulmonary disease) ([place])   . Coronary artery disease   . Dysrhythmias   . GERD (gastroesophageal reflux disease)   . Hyperlipidemia   .  Multiple myeloma ([place])   . Multiple myeloma ([place])   . Myocardial infarction ([place])   . Substance abuse ([place])     PAST SURGICAL HISTORY:  Past Surgical History:  Procedure Laterality Date  . CARDIAC DEFIBRILLATOR PLACEMENT      HEMATOLOGY/ONCOLOGY HISTORY:  Oncology History Overview Note  # SEP 2015- MULTIPLE MYELOMA [multiple PET pos Bone lesions; hypercalcemia s/p BMBx; FISH- Aneuploidy - gain of chromosome 7,9,15,FGFR3/4p16.3, and CCND1/11q13. Loss of MAF/16q23.1), cytogenetics normal 46XY] s/p Vel-Dex-Rev-Zometa; Excellent PR;   # MARCH 2016- REV-DEX ; FEB 2017-[discn Dex] Rev 25 mg 3 w On & 1 w Off; March 2017- M- protein 0.4gm/dl; K/L= 7.9; cont Rev;   # Revlimid HELD June 2018 [sec to ONJ]  # AUG 2019- RECURRENCE- NINLARO-DEX  # March 2017-  Left Middle Lobe cavitary lesion- ~23m- repeat Ct in 3-456m# AUG 2017- ONJ [[name], [place] #  ? ONJ- [phone] not follow up ]- DISCONT Zometa.; NO HBO sec to   # Chronic pain/Anxiety [ortho]  # COPD/smoking/ [[facility]- not candidate for BMT- sec to co-morbidities/poor nutritional status];    # MARCH 2020-PAIN CONTRACT: BREACH-  DECLINE ANY NARCOTICS/BENZO  ---------------------------------------------------------------  DIAGNOSIS: MULTIPLE MYELOMA  STAGE: RECURRENT  ;GOALS: CONTROL  CURRENT/MOST RECENT THERAPY; AUG 2019- NINLARO-DEX    [place] [facility]   ALLERGIES:  has No Known Allergies.  MEDICATIONS:  Current Outpatient Medications  Medication Sig Dispense Refill  . acetaminophen (TYLENOL) 325 MG tablet Take 2 tablets (650 mg total) by mouth every 6 (six) hours as needed for mild pain (or Fever >/= 101).    . Marland Kitchenlbuterol (VENTOLIN HFA)  108 (90 Base) MCG/ACT inhaler Inhale 2 puffs into the lungs every 6 (six) hours as needed for wheezing or shortness of breath. 1 Inhaler 6  . aspirin EC 81 MG EC tablet Take 1 tablet (81 mg total) by mouth daily.    Marland Kitchen atorvastatin (LIPITOR) 20 MG tablet Take 20 mg  by mouth daily.  2  . buprenorphine (BUTRANS) 10 MCG/HR PTWK patch Place 1 patch onto the skin once a week.    . Buprenorphine (BUTRANS) 15 MCG/HR PTWK Place 1 patch onto the skin once a week.    . clopidogrel (PLAVIX) 75 MG tablet Take 1 tablet (75 mg total) by mouth daily. 30 tablet 0  . dexamethasone (DECADRON) 4 MG tablet TAKE 3 TABLETS (12 MG TOTAL) BY MOUTH ONCE A WEEK. TAKE WITH BREAKFAST 12 tablet 4  . digoxin (LANOXIN) 0.25 MG tablet Take 1 tablet (0.25 mg total) by mouth daily. 30 tablet 0  . DULoxetine (CYMBALTA) 30 MG capsule Take 1 capsule (30 mg total) by mouth daily. 90 capsule 3  . Fluticasone-Salmeterol (ADVAIR DISKUS) 250-50 MCG/DOSE AEPB Inhale 1 puff into the lungs 2 (two) times daily. 3 each 2  . gabapentin (NEURONTIN) 300 MG capsule Take 1 capsule (300 mg total) by mouth 3 (three) times daily. 90 capsule 3  . ipratropium-albuterol (DUONEB) 0.5-2.5 (3) MG/3ML SOLN Take 3 mLs by nebulization every 4 (four) hours as needed. 360 mL 3  . ixazomib citrate (NINLARO) 3 MG capsule Take 1 capsule (3 mg total) by mouth once a week. Take for 3weeks on, then 1week off. Take on an empty stomach 1hr before or 2hrs after food 3 capsule 4  . metoprolol succinate (TOPROL-XL) 50 MG 24 hr tablet Take 1 tablet by mouth 2 (two) times a day.    . metoprolol tartrate (LOPRESSOR) 50 MG tablet Take 50 mg by mouth 2 (two) times daily.    . naloxone (NARCAN) nasal spray 4 mg/0.1 mL For Opioid Overdose: Call 911 and administer a single spray of Narcan in one nostril. Repeat every 75mns as needed if no or minimal response 1 kit 2  . ondansetron (ZOFRAN) 8 MG tablet TAKE ONE TABLET BY MOUTH EVERY 8 HOURS AS NEEDED 60 tablet 3  . potassium chloride SA (K-DUR) 20 MEQ tablet Take 20 mEq by mouth daily.    . tamsulosin (FLOMAX) 0.4 MG CAPS capsule Take 1 capsule (0.4 mg total) by mouth daily. 30 capsule 0  . tiotropium (SPIRIVA HANDIHALER) 18 MCG inhalation capsule Place 1 capsule (18 mcg total) into inhaler and  inhale daily. 30 capsule 6  . vitamin B-12 1000 MCG tablet Take 1 tablet (1,000 mcg total) by mouth daily. 30 tablet 0   No current facility-administered medications for this visit.     VITAL SIGNS: There were no vitals taken for this visit. There were no vitals filed for this visit.  Estimated body mass index is 16.74 kg/m as calculated from the following:   Height as of an earlier encounter on 10/10/18: 5' 11"  (1.803 m).   Weight as of an earlier encounter on 10/10/18: 120 lb (54.4 kg).  LABS: CBC:    Component Value Date/Time   WBC 8.3 10/10/2018 1036   HGB 12.5 (L) 10/10/2018 1036   HGB 10.6 (L) 05/12/2014 1343   HCT 39.6 10/10/2018 1036   HCT 30.9 (L) 05/12/2014 1343   PLT 194 10/10/2018 1036   PLT 258 05/12/2014 1343   MCV 91.7 10/10/2018 1036   MCV 92 05/12/2014 1343  NEUTROABS 5.4 10/10/2018 1036   NEUTROABS 3.6 05/12/2014 1343   LYMPHSABS 2.0 10/10/2018 1036   LYMPHSABS 1.2 05/12/2014 1343   MONOABS 0.8 10/10/2018 1036   MONOABS 0.7 05/12/2014 1343   EOSABS 0.1 10/10/2018 1036   EOSABS 0.2 05/12/2014 1343   BASOSABS 0.0 10/10/2018 1036   BASOSABS 0.1 05/12/2014 1343   Comprehensive Metabolic Panel:    Component Value Date/Time   NA 139 10/10/2018 1036   NA 135 05/12/2014 1343   K 3.4 (L) 10/10/2018 1036   K 3.3 (L) 05/12/2014 1343   CL 102 10/10/2018 1036   CL 101 05/12/2014 1343   CO2 29 10/10/2018 1036   CO2 27 05/12/2014 1343   BUN 6 10/10/2018 1036   BUN 9 05/12/2014 1343   CREATININE 0.96 10/10/2018 1036   CREATININE 0.89 05/12/2014 1343   GLUCOSE 102 (H) 10/10/2018 1036   GLUCOSE 77 05/12/2014 1343   CALCIUM 8.8 (L) 10/10/2018 1036   CALCIUM 8.8 (L) 05/12/2014 1343   AST 16 10/10/2018 1036   AST 9 (L) 02/17/2014 1405   ALT 8 10/10/2018 1036   ALT 12 (L) 02/17/2014 1405   ALKPHOS 36 (L) 10/10/2018 1036   ALKPHOS 44 (L) 02/17/2014 1405   BILITOT 0.4 10/10/2018 1036   BILITOT 0.2 02/17/2014 1405   PROT 6.7 10/10/2018 1036   PROT 5.9 (L)  02/17/2014 1405   ALBUMIN 4.0 10/10/2018 1036   ALBUMIN 2.8 (L) 02/17/2014 1405    RADIOGRAPHIC STUDIES: No results found.  PERFORMANCE STATUS (ECOG) : 1 - Symptomatic but completely ambulatory  Review of Systems Unless otherwise noted, a complete review of systems is negative.  Physical Exam General: NAD, frail appearing, thin Pulmonary: Unlabored, on O2 via nasal cannula Extremities: no edema, no joint deformities Skin: no rashes Neurological: Weakness but otherwise nonfocal  IMPRESSION: Routine follow-up visit today in the clinic.    Patient says he is doing well. He denies any significant changes. He continues to be followed by [facility]. They recently increased his Butrans patch to 31mg. Pain has subsequently been improved. Patient denies other distressing symptoms.   Functionally, he seems to be doing well. He appears to be emotionally coping well with his illness.   Patient reports that his oral intake is improved. He is trying to focus on high protein foods. Weight is stable at 120lbs.   Case discussed with Dr. BRogue Bussing   PLAN: -Continue current scope of treatment -Patient followed by pain clinic -RTC in 3 months   Patient expressed understanding and was in agreement with this plan. He also understands that He can call the clinic at any time with any questions, concerns, or complaints.     Time Total: 15 minutes  Visit consisted of counseling and education dealing with the complex and emotionally intense issues of symptom management and palliative care in the setting of serious and potentially life-threatening illness.Greater than 50%  of this time was spent counseling and coordinating care related to the above assessment and plan.  Signed by: JAltha Harm PhD, NP-C [phone](Work Cell)

## 2018-10-10 NOTE — Progress Notes (Signed)
[facility] OFFICE PROGRESS NOTE  Patient Care Team: Petra Kuba, MD as PCP - General (Family Medicine)  Cancer Staging No matching staging information was found for the patient.   Oncology History Overview Note  # SEP 2015- MULTIPLE MYELOMA [multiple PET pos Bone lesions; hypercalcemia s/p BMBx; FISH- Aneuploidy - gain of chromosome 7,9,15,FGFR3/4p16.3, and CCND1/11q13. Loss of MAF/16q23.1), cytogenetics normal 46XY] s/p Vel-Dex-Rev-Zometa; Excellent PR;   # MARCH 2016- REV-DEX ; FEB 2017-[discn Dex] Rev 25 mg 3 w On & 1 w Off; March 2017- M- protein 0.4gm/dl; K/L= 7.9; cont Rev;   # Revlimid HELD June 2018 [sec to ONJ]  # AUG 2019- RECURRENCE- NINLARO-DEX  # March 2017-  Left Middle Lobe cavitary lesion- ~19m- repeat Ct in 3-411m# AUG 2017- ONJ [[name], [place] #  ? ONJ- [phone] not follow up ]- DISCONT Zometa.; NO HBO sec to   # Chronic pain/Anxiety [ortho]  # COPD/smoking/ [[facility]- not candidate for BMT- sec to co-morbidities/poor nutritional status];    # MARCH 2020-PAIN CONTRACT: BREACH-  DECLINE ANY NARCOTICS/BENZO  ---------------------------------------------------------------  DIAGNOSIS: MULTIPLE MYELOMA  STAGE: RECURRENT  ;GOALS: CONTROL  CURRENT/MOST RECENT THERAPY; AUG 2019- NINLARO-DEX    Multiple myeloma in relapse ([facility]     INTERVAL HISTORY:  [name] [age].o.  male pleasant patient above history of recurrent/relapse multiple myeloma-currently on NiKennieth [name] here for follow-up.  Patient states that his back pain is gotten worse recently.  His pain patch buprenorphine has been increased to 15 mcg weekly.  He has not been needing any breakthrough pain medication.  Denies any weakness in the legs.  Chronic nausea; no vomiting.  Patient has chronic shortness of breath cough wheezing.  For any worse.   Review of Systems  Constitutional: Positive for malaise/fatigue. Negative for chills, diaphoresis and fever.  HENT:  Negative for nosebleeds and sore throat.   Eyes: Negative for double vision.  Respiratory: Positive for cough, shortness of breath and wheezing. Negative for hemoptysis and sputum production.   Cardiovascular: Negative for chest pain, palpitations, orthopnea and leg swelling.  Gastrointestinal: Negative for abdominal pain, blood in stool, constipation, diarrhea, heartburn, melena and vomiting.  Genitourinary: Negative for dysuria, frequency and urgency.  Musculoskeletal: Positive for back pain and joint pain.  Skin: Negative.  Negative for itching and rash.  Neurological: Positive for tingling. Negative for dizziness, focal weakness, weakness and headaches.  Endo/Heme/Allergies: Does not bruise/bleed easily.  Psychiatric/Behavioral: Negative for depression. The patient is not nervous/anxious and does not have insomnia.       PAST MEDICAL HISTORY :  Past Medical History:  Diagnosis Date  . Anxiety   . Arthritis   . Atrial septal defect   . CHF (congestive heart failure) ([facility]  . COPD (chronic obstructive pulmonary disease) ([facility] [place]  . Coronary artery disease   . Dysrhythmias   . GERD (gastroesophageal reflux disease)   . Hyperlipidemia   . Multiple myeloma ([facility] [place]  . Multiple myeloma ([facility]  . Myocardial infarction ([facility]  . Substance abuse ([facility]    PAST SURGICAL HISTORY :   Past Surgical History:  Procedure Laterality Date  . CARDIAC DEFIBRILLATOR PLACEMENT      FAMILY HISTORY :   Family History  Problem Relation Age of Onset  . COPD Mother   . CAD Mother   . Heart attack Father   . Prostate cancer Maternal Grandfather   . Kidney cancer Neg Hx   . Bladder  Cancer Neg Hx     SOCIAL HISTORY:   Social History   Tobacco Use  . Smoking status: Current Every Day Smoker    Packs/day: 0.50    Years: 40.00    Pack years: 20.00    Types: Cigarettes  . Smokeless tobacco: Never Used  Substance Use Topics  . Alcohol use: No    Alcohol/week: 0.0 standard drinks  . Drug  use: No    ALLERGIES:  has No Known Allergies.  MEDICATIONS:  Current Outpatient Medications  Medication Sig Dispense Refill  . acetaminophen (TYLENOL) 325 MG tablet Take 2 tablets (650 mg total) by mouth every 6 (six) hours as needed for mild pain (or Fever >/= 101).    Marland Kitchen albuterol (VENTOLIN HFA) 108 (90 Base) MCG/ACT inhaler Inhale 2 puffs into the lungs every 6 (six) hours as needed for wheezing or shortness of breath. 1 Inhaler 6  . aspirin EC 81 MG EC tablet Take 1 tablet (81 mg total) by mouth daily.    Marland Kitchen atorvastatin (LIPITOR) 20 MG tablet Take 20 mg by mouth daily.  2  . Buprenorphine (BUTRANS) 15 MCG/HR PTWK Place 1 patch onto the skin once a week.    . clopidogrel (PLAVIX) 75 MG tablet Take 1 tablet (75 mg total) by mouth daily. 30 tablet 0  . dexamethasone (DECADRON) 4 MG tablet TAKE 3 TABLETS (12 MG TOTAL) BY MOUTH ONCE A WEEK. TAKE WITH BREAKFAST 12 tablet 4  . digoxin (LANOXIN) 0.25 MG tablet Take 1 tablet (0.25 mg total) by mouth daily. 30 tablet 0  . DULoxetine (CYMBALTA) 30 MG capsule Take 1 capsule (30 mg total) by mouth daily. 90 capsule 3  . Fluticasone-Salmeterol (ADVAIR DISKUS) 250-50 MCG/DOSE AEPB Inhale 1 puff into the lungs 2 (two) times daily. 3 each 2  . gabapentin (NEURONTIN) 300 MG capsule Take 1 capsule (300 mg total) by mouth 3 (three) times daily. 90 capsule 3  . ipratropium-albuterol (DUONEB) 0.5-2.5 (3) MG/3ML SOLN Take 3 mLs by nebulization every 4 (four) hours as needed. 360 mL 3  . ixazomib citrate (NINLARO) 3 MG capsule Take 1 capsule (3 mg total) by mouth once a week. Take for 3weeks on, then 1week off. Take on an empty stomach 1hr before or 2hrs after food 3 capsule 4  . metoprolol succinate (TOPROL-XL) 50 MG 24 hr tablet Take 1 tablet by mouth 2 (two) times a day.    . naloxone (NARCAN) nasal spray 4 mg/0.1 mL For Opioid Overdose: Call 911 and administer a single spray of Narcan in one nostril. Repeat every 79mns as needed if no or minimal response 1 kit  2  . ondansetron (ZOFRAN) 8 MG tablet TAKE ONE TABLET BY MOUTH EVERY 8 HOURS AS NEEDED 60 tablet 3  . tamsulosin (FLOMAX) 0.4 MG CAPS capsule Take 1 capsule (0.4 mg total) by mouth daily. 30 capsule 0  . tiotropium (SPIRIVA HANDIHALER) 18 MCG inhalation capsule Place 1 capsule (18 mcg total) into inhaler and inhale daily. 30 capsule 6  . vitamin B-12 1000 MCG tablet Take 1 tablet (1,000 mcg total) by mouth daily. 30 tablet 0  . buprenorphine (BUTRANS) 10 MCG/HR PTWK patch Place 1 patch onto the skin once a week.    . metoprolol tartrate (LOPRESSOR) 50 MG tablet Take 50 mg by mouth 2 (two) times daily.    . potassium chloride SA (K-DUR) 20 MEQ tablet Take 20 mEq by mouth daily.     No current facility-administered medications for this visit.  PHYSICAL EXAMINATION: ECOG PERFORMANCE STATUS: 2 - Symptomatic, <50% confined to bed  BP 104/69 (BP Location: Left Arm, Patient Position: Sitting)   Pulse 74   Temp (!) 96.8 F (36 C) (Tympanic)   Resp 20   Ht _0  (1.803 m)   Wt 120 lb (54.4 kg)   BMI 16.74 kg/m   Filed Weights   10/10/18 1055  Weight: 120 lb (54.4 kg)    Physical Exam  Constitutional: He is oriented to person, place, and time.  Cachectic appearing Caucasian male patient; 2 L nasal cannula.  He is alone.  He is walking by himself.  HENT:  Head: Normocephalic and atraumatic.  Mouth/Throat: Oropharynx is clear and moist. No oropharyngeal exudate.  Eyes: Pupils are equal, round, and reactive to light.  Neck: Normal range of motion. Neck supple.  Cardiovascular: Normal rate and regular rhythm.  Pulmonary/Chest: No respiratory distress. He has wheezes.  Bilateral decreased air entry.  Abdominal: Soft. Bowel sounds are normal. He exhibits no distension and no mass. There is no abdominal tenderness. There is no rebound and no guarding.  Musculoskeletal: Normal range of motion.        General: No tenderness or edema.  Neurological: He is alert and oriented to person,  place, and time.  Skin: Skin is warm.  Psychiatric: Affect normal.       LABORATORY DATA:  I have reviewed the data as listed    Component Value Date/Time   NA 139 10/10/2018 1036   NA 135 05/12/2014 1343   K 3.4 (L) 10/10/2018 1036   K 3.3 (L) 05/12/2014 1343   CL 102 10/10/2018 1036   CL 101 05/12/2014 1343   CO2 29 10/10/2018 1036   CO2 27 05/12/2014 1343   GLUCOSE 102 (H) 10/10/2018 1036   GLUCOSE 77 05/12/2014 1343   BUN 6 10/10/2018 1036   BUN 9 05/12/2014 1343   CREATININE 0.96 10/10/2018 1036   CREATININE 0.89 05/12/2014 1343   CALCIUM 8.8 (L) 10/10/2018 1036   CALCIUM 8.8 (L) 05/12/2014 1343   PROT 6.7 10/10/2018 1036   PROT 5.9 (L) 02/17/2014 1405   ALBUMIN 4.0 10/10/2018 1036   ALBUMIN 2.8 (L) 02/17/2014 1405   AST 16 10/10/2018 1036   AST 9 (L) 02/17/2014 1405   ALT 8 10/10/2018 1036   ALT 12 (L) 02/17/2014 1405   ALKPHOS 36 (L) 10/10/2018 1036   ALKPHOS 44 (L) 02/17/2014 1405   BILITOT 0.4 10/10/2018 1036   BILITOT 0.2 02/17/2014 1405   GFRNONAA >60 10/10/2018 1036   GFRNONAA >60 05/12/2014 1343   GFRAA >60 10/10/2018 1036   GFRAA >60 05/12/2014 1343    No results found for: SPEP, UPEP  Lab Results  Component Value Date   WBC 8.3 10/10/2018   NEUTROABS 5.4 10/10/2018   HGB 12.5 (L) 10/10/2018   HCT 39.6 10/10/2018   MCV 91.7 10/10/2018   PLT 194 10/10/2018      Chemistry      Component Value Date/Time   NA 139 10/10/2018 1036   NA 135 05/12/2014 1343   K 3.4 (L) 10/10/2018 1036   K 3.3 (L) 05/12/2014 1343   CL 102 10/10/2018 1036   CL 101 05/12/2014 1343   CO2 29 10/10/2018 1036   CO2 27 05/12/2014 1343   BUN 6 10/10/2018 1036   BUN 9 05/12/2014 1343   CREATININE 0.96 10/10/2018 1036   CREATININE 0.89 05/12/2014 1343      Component Value Date/Time   CALCIUM 8.8 (  L) 10/10/2018 1036   CALCIUM 8.8 (L) 05/12/2014 1343   ALKPHOS 36 (L) 10/10/2018 1036   ALKPHOS 44 (L) 02/17/2014 1405   AST 16 10/10/2018 1036   AST 9 (L)  02/17/2014 1405   ALT 8 10/10/2018 1036   ALT 12 (L) 02/17/2014 1405   BILITOT 0.4 10/10/2018 1036   BILITOT 0.2 02/17/2014 1405       RADIOGRAPHIC STUDIES: I have personally reviewed the radiological images as listed and agreed with the findings in the report. No results found.   ASSESSMENT & PLAN:  Multiple myeloma in relapse ([place]) # MULTIPLE MYELOMA-AUG 2019 recurrent;  AUG 2019-  Bone survey shows progressive lesions in the bilateral humeri/femur. Oct 2019- PET- no lytic lesions noted.  STABLE.  # Currently on Ninlaro-Dex [start again 12/11]- PR noted; aug 2020- 0.3 -Kappa light chains-slightly abnormal 3.0.  Continue Ninlaro-Dex weekly.    #Bilateral subcentimeter lung nodules -AUG 2020- CT overall stable; will repeat in 6-12 months.   # COPD- advair/spiriva/O2-stable  # Chronic nausea-question secondary to Ninlaro.  Continue Zofran as needed.  Stable  #Chronic pain- worse; increased to 15 mgcurrently on buprenorphine pain patch once a week.  Followed by pain management; patient not needing any breakthrough pain medication.  #Neuropathy-stable gabapentin to 300 3 times daily.  # DISPOSITION: # follow up in 4 weeks- cbc/cmp/MMpanel/K-L light chain-- Dr.B   No orders of the defined types were placed in this encounter.  All questions were answered. The patient knows to call the clinic with any problems, questions or concerns.      Cammie Sickle, MD 10/11/2018 7:45 AM

## 2018-10-10 NOTE — Assessment & Plan Note (Addendum)
#  MULTIPLE MYELOMA-AUG 2019 recurrent;  AUG 2019-  Bone survey shows progressive lesions in the bilateral humeri/femur. Oct 2019- PET- no lytic lesions noted.  STABLE.  # Currently on Ninlaro-Dex [start again 12/11]- PR noted; aug 2020- 0.3 -Kappa light chains-slightly abnormal 3.0.  Continue Ninlaro-Dex weekly.    #Bilateral subcentimeter lung nodules -AUG 2020- CT overall stable; will repeat in 6-12 months.   # COPD- advair/spiriva/O2-stable  # Chronic nausea-question secondary to Ninlaro.  Continue Zofran as needed.  Stable  #Chronic pain- worse; increased to 15 mgcurrently on buprenorphine pain patch once a week.  Followed by pain management; patient not needing any breakthrough pain medication.  #Neuropathy-stable gabapentin to 300 3 times daily.  # DISPOSITION: # follow up in 4 weeks- cbc/cmp/MMpanel/K-L light chain-- Dr.B 

## 2018-10-11 ENCOUNTER — Inpatient Hospital Stay: Payer: Medicaid Other | Admitting: Hospice and Palliative Medicine

## 2018-10-11 LAB — KAPPA/LAMBDA LIGHT CHAINS
Kappa free light chain: 14.5 mg/L (ref 3.3–19.4)
Kappa, lambda light chain ratio: 3.15 — ABNORMAL HIGH (ref 0.26–1.65)
Lambda free light chains: 4.6 mg/L — ABNORMAL LOW (ref 5.7–26.3)

## 2018-10-12 LAB — MULTIPLE MYELOMA PANEL, SERUM
Albumin SerPl Elph-Mcnc: 3.6 g/dL (ref 2.9–4.4)
Albumin/Glob SerPl: 1.6 (ref 0.7–1.7)
Alpha 1: 0.2 g/dL (ref 0.0–0.4)
Alpha2 Glob SerPl Elph-Mcnc: 0.8 g/dL (ref 0.4–1.0)
B-Globulin SerPl Elph-Mcnc: 0.8 g/dL (ref 0.7–1.3)
Gamma Glob SerPl Elph-Mcnc: 0.6 g/dL (ref 0.4–1.8)
Globulin, Total: 2.4 g/dL (ref 2.2–3.9)
IgA: 50 mg/dL — ABNORMAL LOW (ref 90–386)
IgG (Immunoglobin G), Serum: 748 mg/dL (ref 603–1613)
IgM (Immunoglobulin M), Srm: 30 mg/dL (ref 20–172)
M Protein SerPl Elph-Mcnc: 0.2 g/dL — ABNORMAL HIGH
Total Protein ELP: 6 g/dL (ref 6.0–8.5)

## 2018-10-17 ENCOUNTER — Other Ambulatory Visit: Payer: Self-pay | Admitting: Internal Medicine

## 2018-10-17 DIAGNOSIS — C9002 Multiple myeloma in relapse: Secondary | ICD-10-CM

## 2018-10-24 MED FILL — NINLARO 3 MG CAPSULE: 3 | 28 days supply | Qty: 3 | Fill #2

## 2018-11-06 ENCOUNTER — Telehealth: Payer: Self-pay

## 2018-11-06 NOTE — Telephone Encounter (Signed)
Pre-visit assessment call was placed prior to [place] appointment with Dr. Rogue Bussing on 11/07/2018. Pt reports that he had forgotten about his appointment. He requests that his appointment be rescheduled due to a conflicting appointment with another provider. Dr. Aletha Halim team notified and will call the pt to reschedule.

## 2018-11-07 ENCOUNTER — Inpatient Hospital Stay: Payer: Medicaid Other

## 2018-11-07 ENCOUNTER — Inpatient Hospital Stay: Payer: Medicaid Other | Admitting: Internal Medicine

## 2018-11-07 ENCOUNTER — Inpatient Hospital Stay: Payer: Medicaid Other | Admitting: Hospice and Palliative Medicine

## 2018-11-11 ENCOUNTER — Other Ambulatory Visit: Payer: Self-pay

## 2018-11-11 ENCOUNTER — Encounter: Payer: Self-pay | Admitting: *Deleted

## 2018-11-21 MED FILL — NINLARO 3 MG CAPSULE: 3 | 28 days supply | Qty: 3 | Fill #3

## 2018-11-27 ENCOUNTER — Inpatient Hospital Stay: Payer: Medicaid Other

## 2018-11-27 ENCOUNTER — Inpatient Hospital Stay: Payer: Medicaid Other | Admitting: Hospice and Palliative Medicine

## 2018-11-27 ENCOUNTER — Other Ambulatory Visit: Payer: Self-pay | Admitting: Internal Medicine

## 2018-11-27 ENCOUNTER — Inpatient Hospital Stay: Payer: Medicaid Other | Admitting: Internal Medicine

## 2018-11-27 DIAGNOSIS — J449 Chronic obstructive pulmonary disease, unspecified: Secondary | ICD-10-CM

## 2018-12-02 ENCOUNTER — Other Ambulatory Visit: Payer: Self-pay

## 2018-12-02 DIAGNOSIS — C9002 Multiple myeloma in relapse: Secondary | ICD-10-CM

## 2018-12-03 ENCOUNTER — Inpatient Hospital Stay: Payer: Medicaid Other | Attending: Internal Medicine

## 2018-12-03 ENCOUNTER — Inpatient Hospital Stay: Payer: Medicaid Other | Admitting: Internal Medicine

## 2018-12-03 DIAGNOSIS — Z7982 Long term (current) use of aspirin: Secondary | ICD-10-CM | POA: Insufficient documentation

## 2018-12-03 DIAGNOSIS — F329 Major depressive disorder, single episode, unspecified: Secondary | ICD-10-CM | POA: Insufficient documentation

## 2018-12-03 DIAGNOSIS — Z95810 Presence of automatic (implantable) cardiac defibrillator: Secondary | ICD-10-CM | POA: Insufficient documentation

## 2018-12-03 DIAGNOSIS — Z79899 Other long term (current) drug therapy: Secondary | ICD-10-CM | POA: Insufficient documentation

## 2018-12-03 DIAGNOSIS — F419 Anxiety disorder, unspecified: Secondary | ICD-10-CM | POA: Insufficient documentation

## 2018-12-03 DIAGNOSIS — Z8042 Family history of malignant neoplasm of prostate: Secondary | ICD-10-CM | POA: Insufficient documentation

## 2018-12-03 DIAGNOSIS — I252 Old myocardial infarction: Secondary | ICD-10-CM | POA: Insufficient documentation

## 2018-12-03 DIAGNOSIS — Z7951 Long term (current) use of inhaled steroids: Secondary | ICD-10-CM | POA: Insufficient documentation

## 2018-12-03 DIAGNOSIS — J449 Chronic obstructive pulmonary disease, unspecified: Secondary | ICD-10-CM | POA: Insufficient documentation

## 2018-12-03 DIAGNOSIS — I4891 Unspecified atrial fibrillation: Secondary | ICD-10-CM | POA: Insufficient documentation

## 2018-12-03 DIAGNOSIS — Z8673 Personal history of transient ischemic attack (TIA), and cerebral infarction without residual deficits: Secondary | ICD-10-CM | POA: Insufficient documentation

## 2018-12-03 DIAGNOSIS — E785 Hyperlipidemia, unspecified: Secondary | ICD-10-CM | POA: Insufficient documentation

## 2018-12-03 DIAGNOSIS — R918 Other nonspecific abnormal finding of lung field: Secondary | ICD-10-CM | POA: Insufficient documentation

## 2018-12-03 DIAGNOSIS — F17210 Nicotine dependence, cigarettes, uncomplicated: Secondary | ICD-10-CM | POA: Insufficient documentation

## 2018-12-03 DIAGNOSIS — C9002 Multiple myeloma in relapse: Secondary | ICD-10-CM | POA: Insufficient documentation

## 2018-12-03 DIAGNOSIS — Z8249 Family history of ischemic heart disease and other diseases of the circulatory system: Secondary | ICD-10-CM | POA: Insufficient documentation

## 2018-12-03 NOTE — Progress Notes (Deleted)
[place] OFFICE PROGRESS NOTE  Patient Care Team: Petra Kuba, MD as PCP - General (Family Medicine)  Cancer Staging No matching staging information was found for the patient.   Oncology History Overview Note  # SEP 2015- MULTIPLE MYELOMA [multiple PET pos Bone lesions; hypercalcemia s/p BMBx; FISH- Aneuploidy - gain of chromosome 7,9,15,FGFR3/4p16.3, and CCND1/11q13. Loss of MAF/16q23.1), cytogenetics normal 46XY] s/p Vel-Dex-Rev-Zometa; Excellent PR;   # MARCH 2016- REV-DEX ; FEB 2017-[discn Dex] Rev 25 mg 3 w On & 1 w Off; March 2017- M- protein 0.4gm/dl; K/L= 7.9; cont Rev;   # Revlimid HELD June 2018 [sec to ONJ]  # AUG 2019- RECURRENCE- NINLARO-DEX  # March 2017-  Left Middle Lobe cavitary lesion- ~50m- repeat Ct in 3-483m# AUG 2017- ONJ [[name], [place] #  ? ONJ- [phone] not follow up ]- DISCONT Zometa.; NO HBO sec to   # Chronic pain/Anxiety [ortho]  # COPD/smoking/ [[facility]- not candidate for BMT- sec to co-morbidities/poor nutritional status];    # MARCH 2020-PAIN CONTRACT: BREACH-  DECLINE ANY NARCOTICS/BENZO  ---------------------------------------------------------------  DIAGNOSIS: MULTIPLE MYELOMA  STAGE: RECURRENT  ;GOALS: CONTROL  CURRENT/MOST RECENT THERAPY; AUG 2019- NINLARO-DEX    Multiple [place] in relapse ([place]     INTERVAL HISTORY:  [name] [age].o.  male pleasant patient above history of recurrent/relapse multiple myeloma-currently on NiKennieth Rads here for follow-up.  Patient states that his back pain is gotten worse recently.  His pain patch buprenorphine has been increased to 15 mcg weekly.  He has not been needing any breakthrough pain medication.  Denies any weakness in the legs.  Chronic nausea; no vomiting.  Patient has chronic shortness of breath cough wheezing.  For any worse.   Review of Systems  Constitutional: Positive for malaise/fatigue. Negative for chills, diaphoresis and fever.  HENT:  Negative for nosebleeds and sore throat.   Eyes: Negative for double vision.  Respiratory: Positive for cough, shortness of breath and wheezing. Negative for hemoptysis and sputum production.   Cardiovascular: Negative for chest pain, palpitations, orthopnea and leg swelling.  Gastrointestinal: Negative for abdominal pain, blood in stool, constipation, diarrhea, heartburn, melena and vomiting.  Genitourinary: Negative for dysuria, frequency and urgency.  Musculoskeletal: Positive for back pain and joint pain.  Skin: Negative.  Negative for itching and rash.  Neurological: Positive for tingling. Negative for dizziness, focal weakness, weakness and headaches.  Endo/Heme/Allergies: Does not bruise/bleed easily.  Psychiatric/Behavioral: Negative for depression. The patient is not nervous/anxious and does not have insomnia.       PAST MEDICAL HISTORY :  Past Medical History:  Diagnosis Date  . Anxiety   . Arthritis   . Atrial septal defect   . CHF (congestive heart failure) (HCMaype[facility]  . COPD (chronic obstructive pulmonary disease) (HCC)    on 3L O2 - continuous  . Coronary artery disease   . CVA (cerebral vascular accident) ([facility]CHarveys Lake02/[place]   "mild" - no deficits  . Dysrhythmias   . GERD (gastroesophageal reflux disease)   . Hyperlipidemia   . ICD (implantable cardioverter-defibrillator) in place [facility]22/2011   [facility] Multiple myeloma ([facility]  . Myocardial infarction (HCBang[place]  . Neuropathy    feet, legs, hands  . Substance abuse (HCWestern    PAST SURGICAL HISTORY :   Past Surgical History:  Procedure Laterality Date  . CARDIAC DEFIBRILLATOR PLACEMENT  08/22/2011   [employer] single chamber placed by Dr JoMarchia Meiers  [facility]  . TOOTH EXTRACTION      FAMILY HISTORY :   Family History  Problem Relation Age of Onset  . COPD Mother   . CAD Mother   . Heart attack Father   . Prostate cancer Maternal Grandfather   . Kidney cancer Neg Hx   . Bladder  Cancer Neg Hx     SOCIAL HISTORY:   Social History   Tobacco Use  . Smoking status: Current Every Day Smoker    Packs/day: 0.50    Years: 40.00    Pack years: 20.00    Types: Cigarettes  . Smokeless tobacco: Never Used  Substance Use Topics  . Alcohol use: No    Alcohol/week: 0.0 standard drinks  . Drug use: No    ALLERGIES:  has No Known Allergies.  MEDICATIONS:  Current Outpatient Medications  Medication Sig Dispense Refill  . acetaminophen (TYLENOL) 325 MG tablet Take 2 tablets (650 mg total) by mouth every 6 (six) hours as needed for mild pain (or Fever >/= 101).    Marland Kitchen aspirin EC 81 MG EC tablet Take 1 tablet (81 mg total) by mouth daily.    . Buprenorphine (BUTRANS) 15 MCG/HR PTWK Place 1 patch onto the skin once a week.    Marland Kitchen dexamethasone (DECADRON) 4 MG tablet TAKE THREE TABLETS BY MOUTH ONCE A WEEK WITH BREAKFAST 30 tablet 4  . DULoxetine (CYMBALTA) 30 MG capsule Take 1 capsule (30 mg total) by mouth daily. 90 capsule 3  . Fluticasone-Salmeterol (ADVAIR DISKUS) 250-50 MCG/DOSE AEPB Inhale 1 puff into the lungs 2 (two) times daily. 3 each 2  . gabapentin (NEURONTIN) 300 MG capsule Take 1 capsule (300 mg total) by mouth 3 (three) times daily. 90 capsule 3  . ipratropium-albuterol (DUONEB) 0.5-2.5 (3) MG/3ML SOLN Take 3 mLs by nebulization every 4 (four) hours as needed. (Patient not taking: Reported on 11/11/2018) 360 mL 3  . ixazomib citrate (NINLARO) 3 MG capsule Take 1 capsule (3 mg total) by mouth once a week. Take for 3weeks on, then 1week off. Take on an empty stomach 1hr before or 2hrs after food 3 capsule 4  . metoprolol tartrate (LOPRESSOR) 50 MG tablet Take 50 mg by mouth 2 (two) times daily.    . naloxone (NARCAN) nasal spray 4 mg/0.1 mL For Opioid Overdose: Call 911 and administer a single spray of Narcan in one nostril. Repeat every 21mns as needed if no or minimal response 1 kit 2  . ondansetron (ZOFRAN) 8 MG tablet TAKE ONE TABLET BY MOUTH EVERY 8 HOURS AS  NEEDED (Patient not taking: Reported on 11/11/2018) 60 tablet 3  . OXYGEN Inhale 3 L into the lungs continuous.    .Marland KitchenPROAIR HFA 108 (90 Base) MCG/ACT inhaler INHALE 2 PUFFS BY MOUTH INTO THE LUNGS EVERY 6 HOURS AS NEEDED FOR WHEEZING OR SHORTNESS OF BREATH 8.5 g 6  . tiotropium (SPIRIVA HANDIHALER) 18 MCG inhalation capsule Place 1 capsule (18 mcg total) into inhaler and inhale daily. 30 capsule 6   No current facility-administered medications for this visit.     PHYSICAL EXAMINATION: ECOG PERFORMANCE STATUS: 2 - Symptomatic, <50% confined to bed  There were no vitals taken for this visit.  There were no vitals filed for this visit.  Physical Exam  Constitutional: He is oriented to person, place, and time.  Cachectic appearing Caucasian male patient; 2 L nasal cannula.  He is alone.  He is walking by himself.  HENT:  Head: Normocephalic and atraumatic.  Mouth/Throat: Oropharynx is clear and moist. No oropharyngeal exudate.  Eyes: Pupils are equal, round, and reactive to light.  Neck: Normal range of motion. Neck supple.  Cardiovascular: Normal rate and regular rhythm.  Pulmonary/Chest: No respiratory distress. He has wheezes.  Bilateral decreased air entry.  Abdominal: Soft. Bowel sounds are normal. He exhibits no distension and no mass. There is no abdominal tenderness. There is no rebound and no guarding.  Musculoskeletal: Normal range of motion.        General: No tenderness or edema.  Neurological: He is alert and oriented to person, place, and time.  Skin: Skin is warm.  Psychiatric: Affect normal.       LABORATORY DATA:  I have reviewed the data as listed    Component Value Date/Time   NA 139 10/10/2018 1036   NA 135 05/12/2014 1343   K 3.4 (L) 10/10/2018 1036   K 3.3 (L) 05/12/2014 1343   CL 102 10/10/2018 1036   CL 101 05/12/2014 1343   CO2 29 10/10/2018 1036   CO2 27 05/12/2014 1343   GLUCOSE 102 (H) 10/10/2018 1036   GLUCOSE 77 05/12/2014 1343   BUN 6  10/10/2018 1036   BUN 9 05/12/2014 1343   CREATININE 0.96 10/10/2018 1036   CREATININE 0.89 05/12/2014 1343   CALCIUM 8.8 (L) 10/10/2018 1036   CALCIUM 8.8 (L) 05/12/2014 1343   PROT 6.7 10/10/2018 1036   PROT 5.9 (L) 02/17/2014 1405   ALBUMIN 4.0 10/10/2018 1036   ALBUMIN 2.8 (L) 02/17/2014 1405   AST 16 10/10/2018 1036   AST 9 (L) 02/17/2014 1405   ALT 8 10/10/2018 1036   ALT 12 (L) 02/17/2014 1405   ALKPHOS 36 (L) 10/10/2018 1036   ALKPHOS 44 (L) 02/17/2014 1405   BILITOT 0.4 10/10/2018 1036   BILITOT 0.2 02/17/2014 1405   GFRNONAA >60 10/10/2018 1036   GFRNONAA >60 05/12/2014 1343   GFRAA >60 10/10/2018 1036   GFRAA >60 05/12/2014 1343    No results found for: SPEP, UPEP  Lab Results  Component Value Date   WBC 8.3 10/10/2018   NEUTROABS 5.4 10/10/2018   HGB 12.5 (L) 10/10/2018   HCT 39.6 10/10/2018   MCV 91.7 10/10/2018   PLT 194 10/10/2018      Chemistry      Component Value Date/Time   NA 139 10/10/2018 1036   NA 135 05/12/2014 1343   K 3.4 (L) 10/10/2018 1036   K 3.3 (L) 05/12/2014 1343   CL 102 10/10/2018 1036   CL 101 05/12/2014 1343   CO2 29 10/10/2018 1036   CO2 27 05/12/2014 1343   BUN 6 10/10/2018 1036   BUN 9 05/12/2014 1343   CREATININE 0.96 10/10/2018 1036   CREATININE 0.89 05/12/2014 1343      Component Value Date/Time   CALCIUM 8.8 (L) 10/10/2018 1036   CALCIUM 8.8 (L) 05/12/2014 1343   ALKPHOS 36 (L) 10/10/2018 1036   ALKPHOS 44 (L) 02/17/2014 1405   AST 16 10/10/2018 1036   AST 9 (L) 02/17/2014 1405   ALT 8 10/10/2018 1036   ALT 12 (L) 02/17/2014 1405   BILITOT 0.4 10/10/2018 1036   BILITOT 0.2 02/17/2014 1405       RADIOGRAPHIC STUDIES: I have personally reviewed the radiological images as listed and agreed with the findings in the report. No results found.   ASSESSMENT & PLAN:  No problem-specific Assessment & Plan notes found for this encounter.   No orders of the defined types were  placed in this encounter.  All  questions were answered. The patient knows to call the clinic with any problems, questions or concerns.      Cammie Sickle, MD 12/03/2018 8:07 AM

## 2018-12-03 NOTE — Assessment & Plan Note (Deleted)
#  MULTIPLE MYELOMA-AUG 2019 recurrent;  AUG 2019-  Bone survey shows progressive lesions in the bilateral humeri/femur. Oct 2019- PET- no lytic lesions noted.  STABLE.  # Currently on Ninlaro-Dex [start again 12/11]- PR noted; aug 2020- 0.3 -Kappa light chains-slightly abnormal 3.0.  Continue Ninlaro-Dex weekly.    #Bilateral subcentimeter lung nodules -AUG 2020- CT overall stable; will repeat in 6-12 months.   # COPD- advair/spiriva/O2-stable  # Chronic nausea-question secondary to Ninlaro.  Continue Zofran as needed.  Stable  #Chronic pain- worse; increased to 15 mgcurrently on buprenorphine pain patch once a week.  Followed by pain management; patient not needing any breakthrough pain medication.  #Neuropathy-stable gabapentin to 300 3 times daily.  # DISPOSITION: # follow up in 4 weeks- cbc/cmp/MMpanel/K-L light chain-- Dr.B

## 2018-12-06 ENCOUNTER — Encounter: Payer: Self-pay | Admitting: *Deleted

## 2018-12-09 ENCOUNTER — Other Ambulatory Visit: Payer: Self-pay | Admitting: *Deleted

## 2018-12-10 ENCOUNTER — Other Ambulatory Visit: Payer: Self-pay | Admitting: *Deleted

## 2018-12-10 ENCOUNTER — Other Ambulatory Visit: Payer: Self-pay

## 2018-12-10 ENCOUNTER — Other Ambulatory Visit: Payer: Self-pay | Admitting: Internal Medicine

## 2018-12-10 ENCOUNTER — Other Ambulatory Visit
Admission: RE | Admit: 2018-12-10 | Discharge: 2018-12-10 | Disposition: A | Discharge status: Home / Self Care | Payer: Medicaid Other | Source: Ambulatory Visit | Attending: Ophthalmology | Admitting: Ophthalmology

## 2018-12-10 DIAGNOSIS — G893 Neoplasm related pain (acute) (chronic): Secondary | ICD-10-CM

## 2018-12-10 DIAGNOSIS — C9002 Multiple myeloma in relapse: Secondary | ICD-10-CM

## 2018-12-10 DIAGNOSIS — Z01812 Encounter for preprocedural laboratory examination: Secondary | ICD-10-CM | POA: Diagnosis not present

## 2018-12-10 DIAGNOSIS — Z20828 Contact with and (suspected) exposure to other viral communicable diseases: Secondary | ICD-10-CM | POA: Diagnosis not present

## 2018-12-10 MED ORDER — GABAPENTIN 300 MG PO CAPS: 300.0000 mg | ORAL_CAPSULE | Freq: Three times a day (TID) | ORAL | 0 refills | Status: DC

## 2018-12-10 NOTE — Telephone Encounter (Signed)
This was refused on another encounter. Patient needs appointment first as he has missed several appts

## 2018-12-11 ENCOUNTER — Other Ambulatory Visit: Payer: Self-pay

## 2018-12-11 LAB — SARS CORONAVIRUS 2 (TAT 6-24 HRS): SARS Coronavirus 2: NEGATIVE

## 2018-12-11 NOTE — Progress Notes (Signed)
Patient pre screened for office appointment, no questions or concerns today. Patient reminded of upcoming appointment time and date. Patient unavailable wife supplied information.

## 2018-12-12 ENCOUNTER — Other Ambulatory Visit: Payer: Self-pay

## 2018-12-12 ENCOUNTER — Inpatient Hospital Stay: Payer: Medicaid Other

## 2018-12-12 ENCOUNTER — Inpatient Hospital Stay (HOSPITAL_BASED_OUTPATIENT_CLINIC_OR_DEPARTMENT_OTHER): Payer: Medicaid Other | Admitting: Internal Medicine

## 2018-12-12 DIAGNOSIS — Z7982 Long term (current) use of aspirin: Secondary | ICD-10-CM | POA: Diagnosis not present

## 2018-12-12 DIAGNOSIS — I252 Old myocardial infarction: Secondary | ICD-10-CM | POA: Diagnosis not present

## 2018-12-12 DIAGNOSIS — Z8673 Personal history of transient ischemic attack (TIA), and cerebral infarction without residual deficits: Secondary | ICD-10-CM | POA: Diagnosis not present

## 2018-12-12 DIAGNOSIS — Z8042 Family history of malignant neoplasm of prostate: Secondary | ICD-10-CM | POA: Diagnosis not present

## 2018-12-12 DIAGNOSIS — Z79899 Other long term (current) drug therapy: Secondary | ICD-10-CM | POA: Diagnosis not present

## 2018-12-12 DIAGNOSIS — Z8249 Family history of ischemic heart disease and other diseases of the circulatory system: Secondary | ICD-10-CM | POA: Diagnosis not present

## 2018-12-12 DIAGNOSIS — Z7951 Long term (current) use of inhaled steroids: Secondary | ICD-10-CM | POA: Diagnosis not present

## 2018-12-12 DIAGNOSIS — F419 Anxiety disorder, unspecified: Secondary | ICD-10-CM | POA: Diagnosis not present

## 2018-12-12 DIAGNOSIS — C9002 Multiple myeloma in relapse: Secondary | ICD-10-CM | POA: Diagnosis not present

## 2018-12-12 DIAGNOSIS — G893 Neoplasm related pain (acute) (chronic): Secondary | ICD-10-CM

## 2018-12-12 DIAGNOSIS — F329 Major depressive disorder, single episode, unspecified: Secondary | ICD-10-CM | POA: Diagnosis not present

## 2018-12-12 DIAGNOSIS — R918 Other nonspecific abnormal finding of lung field: Secondary | ICD-10-CM | POA: Diagnosis not present

## 2018-12-12 DIAGNOSIS — F17210 Nicotine dependence, cigarettes, uncomplicated: Secondary | ICD-10-CM | POA: Diagnosis not present

## 2018-12-12 DIAGNOSIS — I4891 Unspecified atrial fibrillation: Secondary | ICD-10-CM | POA: Diagnosis not present

## 2018-12-12 DIAGNOSIS — Z95810 Presence of automatic (implantable) cardiac defibrillator: Secondary | ICD-10-CM | POA: Diagnosis not present

## 2018-12-12 DIAGNOSIS — E785 Hyperlipidemia, unspecified: Secondary | ICD-10-CM | POA: Diagnosis not present

## 2018-12-12 DIAGNOSIS — J449 Chronic obstructive pulmonary disease, unspecified: Secondary | ICD-10-CM | POA: Diagnosis not present

## 2018-12-12 LAB — CBC WITH DIFFERENTIAL/PLATELET
Abs Immature Granulocytes: 0.06 10*3/uL (ref 0.00–0.07)
Basophils Absolute: 0 10*3/uL (ref 0.0–0.1)
Basophils Relative: 0 %
Eosinophils Absolute: 0 10*3/uL (ref 0.0–0.5)
Eosinophils Relative: 0 %
HCT: 37.2 % — ABNORMAL LOW (ref 39.0–52.0)
Hemoglobin: 12.1 g/dL — ABNORMAL LOW (ref 13.0–17.0)
Immature Granulocytes: 1 %
Lymphocytes Relative: 10 %
Lymphs Abs: 1.3 10*3/uL (ref 0.7–4.0)
MCH: 28.6 pg (ref 26.0–34.0)
MCHC: 32.5 g/dL (ref 30.0–36.0)
MCV: 87.9 fL (ref 80.0–100.0)
Monocytes Absolute: 1 10*3/uL (ref 0.1–1.0)
Monocytes Relative: 8 %
Neutro Abs: 10.4 10*3/uL — ABNORMAL HIGH (ref 1.7–7.7)
Neutrophils Relative %: 81 %
Platelets: 251 10*3/uL (ref 150–400)
RBC: 4.23 MIL/uL (ref 4.22–5.81)
RDW: 14.2 % (ref 11.5–15.5)
WBC: 12.8 10*3/uL — ABNORMAL HIGH (ref 4.0–10.5)
nRBC: 0 % (ref 0.0–0.2)

## 2018-12-12 LAB — COMPREHENSIVE METABOLIC PANEL
ALT: 10 U/L (ref 0–44)
AST: 17 U/L (ref 15–41)
Albumin: 3.7 g/dL (ref 3.5–5.0)
Alkaline Phosphatase: 39 U/L (ref 38–126)
Anion gap: 7 (ref 5–15)
BUN: 13 mg/dL (ref 6–20)
CO2: 25 mmol/L (ref 22–32)
Calcium: 8.7 mg/dL — ABNORMAL LOW (ref 8.9–10.3)
Chloride: 99 mmol/L (ref 98–111)
Creatinine, Ser: 0.81 mg/dL (ref 0.61–1.24)
GFR calc Af Amer: >60 mL/min (ref >60)
GFR calc non Af Amer: >60 mL/min (ref >60)
Glucose, Bld: 117 mg/dL — ABNORMAL HIGH (ref 70–99)
Potassium: 3.6 mmol/L (ref 3.5–5.1)
Sodium: 131 mmol/L — ABNORMAL LOW (ref 135–145)
Total Bilirubin: 0.5 mg/dL (ref 0.3–1.2)
Total Protein: 6.8 g/dL (ref 6.5–8.1)

## 2018-12-12 MED ORDER — GABAPENTIN 300 MG PO CAPS: 300.0000 mg | ORAL_CAPSULE | Freq: Three times a day (TID) | ORAL | 0 refills | Status: DC

## 2018-12-12 NOTE — Progress Notes (Signed)
[place] OFFICE PROGRESS NOTE  Patient Care Team: Petra Kuba, MD as PCP - General (Family Medicine)  Cancer Staging No matching staging information was found for the patient.   Oncology History Overview Note  # SEP 2015- MULTIPLE MYELOMA [multiple PET pos Bone lesions; hypercalcemia s/p BMBx; FISH- Aneuploidy - gain of chromosome 7,9,15,FGFR3/4p16.3, and CCND1/11q13. Loss of MAF/16q23.1), cytogenetics normal 46XY] s/p Vel-Dex-Rev-Zometa; Excellent PR;   # MARCH 2016- REV-DEX ; FEB 2017-[discn Dex] Rev 25 mg 3 w On & 1 w Off; March 2017- M- protein 0.4gm/dl; K/L= 7.9; cont Rev;   # Revlimid HELD June 2018 [sec to ONJ]  # AUG 2019- RECURRENCE- NINLARO-DEX  # March 2017-  Left Middle Lobe cavitary lesion- ~38m- repeat Ct in 3-410m# AUG 2017- ONJ [[name], [place] #  ? ONJ- [phone] not follow up ]- DISCONT Zometa.; NO HBO sec to   # Chronic pain/Anxiety [ortho]  # COPD/smoking/ [[facility]- not candidate for BMT- sec to co-morbidities/poor nutritional status];    # MARCH 2020-PAIN CONTRACT: BREACH-  DECLINE ANY NARCOTICS/BENZO  ---------------------------------------------------------------  DIAGNOSIS: MULTIPLE MYELOMA  STAGE: RECURRENT  ;GOALS: CONTROL  CURRENT/MOST RECENT THERAPY; AUG 2019- NINLARO-DEX    Multiple [place] in relapse ([place]CP[place]     INTERVAL HISTORY:  [name] [age].o.  male pleasant patient above history of recurrent/relapse multiple myeloma-currently on Ninlaro-dex is here for follow-up.  Patient states his back pain is better controlled.  He is currently on the buprenorphine pain patch.  He is not needing any breakthrough pain medication.  Chronic shortness of breath chronic cough not any worse.  Chronic mild nausea-currently stable.  Denies any significant diarrhea.   Review of Systems  Constitutional: Positive for malaise/fatigue. Negative for chills, diaphoresis and fever.  HENT: Negative for nosebleeds and sore  throat.   Eyes: Negative for double vision.  Respiratory: Positive for cough and shortness of breath. Negative for hemoptysis and sputum production.   Cardiovascular: Negative for chest pain, palpitations, orthopnea and leg swelling.  Gastrointestinal: Negative for abdominal pain, blood in stool, constipation, diarrhea, heartburn, melena and vomiting.  Genitourinary: Negative for dysuria, frequency and urgency.  Musculoskeletal: Positive for back pain and joint pain.  Skin: Negative.  Negative for itching and rash.  Neurological: Positive for tingling. Negative for dizziness, focal weakness, weakness and headaches.  Endo/Heme/Allergies: Does not bruise/bleed easily.  Psychiatric/Behavioral: Negative for depression. The patient is not nervous/anxious and does not have insomnia.       PAST MEDICAL HISTORY :  Past Medical History:  Diagnosis Date  . Anxiety   . Arthritis   . Atrial septal defect   . CHF (congestive heart failure) (HCMull[place]  . COPD (chronic obstructive pulmonary disease) (HCC)    on 3L O2 - continuous  . Coronary artery disease   . CVA (cerebral vascular accident) (HCBelle [place]02/2020   "mild" - no deficits  . Depression   . Dyspnea    with exertion  . Dysrhythmia    nonsustained VT, Afib  . Dysrhythmias   . GERD (gastroesophageal reflux disease)   . Hyperlipidemia   . ICD (implantable cardioverter-defibrillator) in place 08/22/2011   [facility]. Mal[facility]ished ([facility]CT[facility]  . Multiple myeloma (HCC)    multiple myeloma  . Myocardial infarction (HCGirardville  . Neuropathy    feet, legs, hands  . Presence of permanent cardiac pacemaker    defib pacemaker  . Substance abuse (HCAirport Heights    PAST  SURGICAL HISTORY :   Past Surgical History:  Procedure Laterality Date  . CARDIAC DEFIBRILLATOR PLACEMENT  08/22/2011   [employer] single chamber placed by Dr Marchia Meiers, [place]  . TOOTH EXTRACTION      FAMILY HISTORY :   Family History  Problem Relation  Age of Onset  . COPD Mother   . CAD Mother   . Heart attack Father   . Prostate cancer Maternal Grandfather   . Kidney cancer Neg Hx   . Bladder Cancer Neg Hx     SOCIAL HISTORY:   Social History   Tobacco Use  . Smoking status: Current Every Day Smoker    Packs/day: 0.50    Years: 40.00    Pack years: 20.00    Types: Cigarettes  . Smokeless tobacco: Never Used  Substance Use Topics  . Alcohol use: No    Alcohol/week: 0.0 standard drinks  . Drug use: No    ALLERGIES:  has No Known Allergies.  MEDICATIONS:  Current Outpatient Medications  Medication Sig Dispense Refill  . acetaminophen (TYLENOL) 325 MG tablet Take 2 tablets (650 mg total) by mouth every 6 (six) hours as needed for mild pain (or Fever >/= 101).    Marland Kitchen aspirin EC 81 MG EC tablet Take 1 tablet (81 mg total) by mouth daily.    . Buprenorphine (BUTRANS) 15 MCG/HR PTWK Place 1 patch onto the skin once a week.    Marland Kitchen dexamethasone (DECADRON) 4 MG tablet TAKE THREE TABLETS BY MOUTH ONCE A WEEK WITH BREAKFAST (Patient taking differently: Take 12 mg by mouth every Wednesday. ) 30 tablet 4  . DULoxetine (CYMBALTA) 30 MG capsule Take 1 capsule (30 mg total) by mouth daily. 90 capsule 3  . Fluticasone-Salmeterol (ADVAIR DISKUS) 250-50 MCG/DOSE AEPB Inhale 1 puff into the lungs 2 (two) times daily. 3 each 2  . gabapentin (NEURONTIN) 300 MG capsule Take 1 capsule (300 mg total) by mouth 3 (three) times daily. 90 capsule 0  . ipratropium-albuterol (DUONEB) 0.5-2.5 (3) MG/3ML SOLN Take 3 mLs by nebulization every 4 (four) hours as needed. (Patient taking differently: Take 3 mLs by nebulization every 4 (four) hours as needed. Asthma) 360 mL 3  . ixazomib citrate (NINLARO) 3 MG capsule Take 1 capsule (3 mg total) by mouth once a week. Take for 3weeks on, then 1week off. Take on an empty stomach 1hr before or 2hrs after food 3 capsule 4  . metoprolol tartrate (LOPRESSOR) 50 MG tablet Take 50 mg by mouth 2 (two) times daily.    .  naloxone (NARCAN) nasal spray 4 mg/0.1 mL For Opioid Overdose: Call 911 and administer a single spray of Narcan in one nostril. Repeat every 41mns as needed if no or minimal response 1 kit 2  . ondansetron (ZOFRAN) 8 MG tablet TAKE ONE TABLET BY MOUTH EVERY 8 HOURS AS NEEDED (Patient taking differently: Take 8 mg by mouth every 8 (eight) hours as needed for nausea or vomiting. TAKE ONE TABLET BY MOUTH EVERY 8 HOURS AS NEEDED) 60 tablet 3  . OXYGEN Inhale 3 L into the lungs continuous.    .Marland KitchenPROAIR HFA 108 (90 Base) MCG/ACT inhaler INHALE 2 PUFFS BY MOUTH INTO THE LUNGS EVERY 6 HOURS AS NEEDED FOR WHEEZING OR SHORTNESS OF BREATH (Patient taking differently: Inhale 2 puffs into the lungs every 6 (six) hours as needed for wheezing or shortness of breath. ) 8.5 g 6  . tiotropium (SPIRIVA HANDIHALER) 18 MCG inhalation capsule Place 1 capsule (18  mcg total) into inhaler and inhale daily. 30 capsule 6   No current facility-administered medications for this visit.     PHYSICAL EXAMINATION: ECOG PERFORMANCE STATUS: 2 - Symptomatic, <50% confined to bed  BP 99/70 (BP Location: Left Arm, Patient Position: Sitting, Cuff Size: Normal)   Pulse 78   Temp (!) 96.2 F (35.7 C) (Tympanic)   Wt 118 lb 9 oz (53.8 kg)   SpO2 99% Comment: on 3 liters of o2  BMI 16.08 kg/m   Filed Weights   12/12/18 1133  Weight: 118 lb 9 oz (53.8 kg)    Physical Exam  Constitutional: He is oriented to person, place, and time.  Cachectic appearing Caucasian male patient; 2 L nasal cannula.  He is alone.  He is walking by himself.  HENT:  Head: Normocephalic and atraumatic.  Mouth/Throat: Oropharynx is clear and moist. No oropharyngeal exudate.  Eyes: Pupils are equal, round, and reactive to light.  Neck: Normal range of motion. Neck supple.  Cardiovascular: Normal rate and regular rhythm.  Pulmonary/Chest: No respiratory distress. He has wheezes.  Bilateral decreased air entry.  Abdominal: Soft. Bowel sounds are normal.  He exhibits no distension and no mass. There is no abdominal tenderness. There is no rebound and no guarding.  Musculoskeletal: Normal range of motion.        General: No tenderness or edema.  Neurological: He is alert and oriented to person, place, and time.  Skin: Skin is warm.  Psychiatric: Affect normal.       LABORATORY DATA:  I have reviewed the data as listed    Component Value Date/Time   NA 131 (L) 12/12/2018 1122   NA 135 05/12/2014 1343   K 3.6 12/12/2018 1122   K 3.3 (L) 05/12/2014 1343   CL 99 12/12/2018 1122   CL 101 05/12/2014 1343   CO2 25 12/12/2018 1122   CO2 27 05/12/2014 1343   GLUCOSE 117 (H) 12/12/2018 1122   GLUCOSE 77 05/12/2014 1343   BUN 13 12/12/2018 1122   BUN 9 05/12/2014 1343   CREATININE 0.81 12/12/2018 1122   CREATININE 0.89 05/12/2014 1343   CALCIUM 8.7 (L) 12/12/2018 1122   CALCIUM 8.8 (L) 05/12/2014 1343   PROT 6.8 12/12/2018 1122   PROT 5.9 (L) 02/17/2014 1405   ALBUMIN 3.7 12/12/2018 1122   ALBUMIN 2.8 (L) 02/17/2014 1405   AST 17 12/12/2018 1122   AST 9 (L) 02/17/2014 1405   ALT 10 12/12/2018 1122   ALT 12 (L) 02/17/2014 1405   ALKPHOS 39 12/12/2018 1122   ALKPHOS 44 (L) 02/17/2014 1405   BILITOT 0.5 12/12/2018 1122   BILITOT 0.2 02/17/2014 1405   GFRNONAA >60 12/12/2018 1122   GFRNONAA >60 05/12/2014 1343   GFRAA >60 12/12/2018 1122   GFRAA >60 05/12/2014 1343    No results found for: SPEP, UPEP  Lab Results  Component Value Date   WBC 12.8 (H) 12/12/2018   NEUTROABS 10.4 (H) 12/12/2018   HGB 12.1 (L) 12/12/2018   HCT 37.2 (L) 12/12/2018   MCV 87.9 12/12/2018   PLT 251 12/12/2018      Chemistry      Component Value Date/Time   NA 131 (L) 12/12/2018 1122   NA 135 05/12/2014 1343   K 3.6 12/12/2018 1122   K 3.3 (L) 05/12/2014 1343   CL 99 12/12/2018 1122   CL 101 05/12/2014 1343   CO2 25 12/12/2018 1122   CO2 27 05/12/2014 1343   BUN 13 12/12/2018 1122  BUN 9 05/12/2014 1343   CREATININE 0.81 12/12/2018  1122   CREATININE 0.89 05/12/2014 1343      Component Value Date/Time   CALCIUM 8.7 (L) 12/12/2018 1122   CALCIUM 8.8 (L) 05/12/2014 1343   ALKPHOS 39 12/12/2018 1122   ALKPHOS 44 (L) 02/17/2014 1405   AST 17 12/12/2018 1122   AST 9 (L) 02/17/2014 1405   ALT 10 12/12/2018 1122   ALT 12 (L) 02/17/2014 1405   BILITOT 0.5 12/12/2018 1122   BILITOT 0.2 02/17/2014 1405       RADIOGRAPHIC STUDIES: I have personally reviewed the radiological images as listed and agreed with the findings in the report. No results found.   ASSESSMENT & PLAN:  Multiple myeloma in relapse ([place]) # MULTIPLE MYELOMA-AUG 2019 recurrent;   Oct 2019- PET- no lytic lesions noted. STABLE.   # Currently on Ninlaro-Dex [start again 01/02/2018]- PR noted; OCT  2020- 0.2 -Kappa light chains-slightly abnormal 3.0.  Continue Ninlaro-Dex weekly.    #Bilateral subcentimeter lung nodules -AUG 2020- CT overall stable; will order imaging at next visit.   # COPD- advair/spiriva/O2- STABLE  # Chronic nausea-question secondary to Ninlaro. STABLE.   #Chronic pain-  STABLE; 15 mgcurrently on buprenorphine pain patch once a week; pain management]; ; patient not needing any prn medications.   #Neuropathy-STABLE. gabapentin to 300 TID; refilled .  # DISPOSITION: # follow up in 4 weeks- cbc/cmp/MMpanel/K-L light chain-- Dr.B   Orders Placed This Encounter  Procedures  . CBC with Differential    Standing Status:   Future    Standing Expiration Date:   12/12/2019  . Comprehensive metabolic panel    Standing Status:   Future    Standing Expiration Date:   12/12/2019  . Multiple Myeloma Panel (SPEP&IFE w/QIG)    Standing Status:   Future    Standing Expiration Date:   12/12/2019  . Kappa/lambda light chains    Standing Status:   Future    Standing Expiration Date:   12/12/2019   All questions were answered. The patient knows to call the clinic with any problems, questions or concerns.      Cammie Sickle,  MD 12/13/2018 6:47 AM

## 2018-12-12 NOTE — Assessment & Plan Note (Addendum)
#  MULTIPLE MYELOMA-AUG 2019 recurrent;   Oct 2019- PET- no lytic lesions noted. STABLE.   # Currently on Ninlaro-Dex [start again 01/02/2018]- PR noted; OCT  2020- 0.2 -Kappa light chains-slightly abnormal 3.0.  Continue Ninlaro-Dex weekly.    #Bilateral subcentimeter lung nodules -AUG 2020- CT overall stable; will order imaging at next visit.   # COPD- advair/spiriva/O2- STABLE  # Chronic nausea-question secondary to Ninlaro. STABLE.   #Chronic pain-  STABLE; 15 mgcurrently on buprenorphine pain patch once a week; pain management]; ; patient not needing any prn medications.   #Neuropathy-STABLE. gabapentin to 300 TID; refilled .  # DISPOSITION: # follow up in 4 weeks- cbc/cmp/MMpanel/K-L light chain-- Dr.B

## 2018-12-12 NOTE — Progress Notes (Signed)
Secure chat to Dr Ola Spurr re: pt scheduled for cataract removal on 12/13/2018. Pt has ICD- ok to proceed.

## 2018-12-12 NOTE — Progress Notes (Signed)
Notified Dr Inda Coke office left message re: Defib/pacemaker form. Pt having surgery 12/13/2018

## 2018-12-13 ENCOUNTER — Encounter: Payer: Self-pay | Admitting: Anesthesiology

## 2018-12-13 ENCOUNTER — Ambulatory Visit
Admission: RE | Admit: 2018-12-13 | Discharge: 2018-12-13 | Disposition: A | Discharge status: Home / Self Care | Payer: Medicaid Other | Attending: Ophthalmology | Admitting: Ophthalmology

## 2018-12-13 ENCOUNTER — Ambulatory Visit: Payer: Self-pay | Admitting: Anesthesiology

## 2018-12-13 ENCOUNTER — Other Ambulatory Visit: Payer: Self-pay

## 2018-12-13 ENCOUNTER — Encounter: Payer: Self-pay | Admitting: *Deleted

## 2018-12-13 ENCOUNTER — Encounter
Admission: RE | Disposition: A | Discharge status: Home / Self Care | Payer: Self-pay | Source: Home / Self Care | Attending: Ophthalmology

## 2018-12-13 DIAGNOSIS — I4510 Unspecified right bundle-branch block: Secondary | ICD-10-CM | POA: Diagnosis not present

## 2018-12-13 DIAGNOSIS — I509 Heart failure, unspecified: Secondary | ICD-10-CM | POA: Insufficient documentation

## 2018-12-13 DIAGNOSIS — Z951 Presence of aortocoronary bypass graft: Secondary | ICD-10-CM | POA: Diagnosis not present

## 2018-12-13 DIAGNOSIS — H2511 Age-related nuclear cataract, right eye: Secondary | ICD-10-CM | POA: Insufficient documentation

## 2018-12-13 DIAGNOSIS — K219 Gastro-esophageal reflux disease without esophagitis: Secondary | ICD-10-CM | POA: Diagnosis not present

## 2018-12-13 DIAGNOSIS — Z955 Presence of coronary angioplasty implant and graft: Secondary | ICD-10-CM | POA: Diagnosis not present

## 2018-12-13 DIAGNOSIS — Z9981 Dependence on supplemental oxygen: Secondary | ICD-10-CM | POA: Diagnosis not present

## 2018-12-13 DIAGNOSIS — E785 Hyperlipidemia, unspecified: Secondary | ICD-10-CM | POA: Insufficient documentation

## 2018-12-13 DIAGNOSIS — F17210 Nicotine dependence, cigarettes, uncomplicated: Secondary | ICD-10-CM | POA: Insufficient documentation

## 2018-12-13 DIAGNOSIS — I2510 Atherosclerotic heart disease of native coronary artery without angina pectoris: Secondary | ICD-10-CM | POA: Diagnosis not present

## 2018-12-13 DIAGNOSIS — F419 Anxiety disorder, unspecified: Secondary | ICD-10-CM | POA: Insufficient documentation

## 2018-12-13 DIAGNOSIS — Z95810 Presence of automatic (implantable) cardiac defibrillator: Secondary | ICD-10-CM | POA: Insufficient documentation

## 2018-12-13 DIAGNOSIS — F329 Major depressive disorder, single episode, unspecified: Secondary | ICD-10-CM | POA: Diagnosis not present

## 2018-12-13 DIAGNOSIS — J449 Chronic obstructive pulmonary disease, unspecified: Secondary | ICD-10-CM | POA: Insufficient documentation

## 2018-12-13 DIAGNOSIS — Z8673 Personal history of transient ischemic attack (TIA), and cerebral infarction without residual deficits: Secondary | ICD-10-CM | POA: Diagnosis not present

## 2018-12-13 HISTORY — DX: Cardiac arrhythmia, unspecified: I49.9

## 2018-12-13 HISTORY — DX: Polyneuropathy, unspecified: G62.9

## 2018-12-13 HISTORY — PX: CATARACT EXTRACTION W/PHACO: SHX586

## 2018-12-13 HISTORY — DX: Dyspnea, unspecified: R06.00

## 2018-12-13 HISTORY — DX: Depression, unspecified: F32.A

## 2018-12-13 HISTORY — DX: Presence of cardiac pacemaker: Z95.0

## 2018-12-13 HISTORY — DX: Unspecified protein-calorie malnutrition: E46

## 2018-12-13 LAB — KAPPA/LAMBDA LIGHT CHAINS
Kappa free light chain: 11.9 mg/L (ref 3.3–19.4)
Kappa, lambda light chain ratio: 2.7 — ABNORMAL HIGH (ref 0.26–1.65)
Lambda free light chains: 4.4 mg/L — ABNORMAL LOW (ref 5.7–26.3)

## 2018-12-13 SURGERY — PHACOEMULSIFICATION, CATARACT, WITH IOL INSERTION
Anesthesia: Monitor Anesthesia Care | Site: Eye | Laterality: Right

## 2018-12-13 MED ORDER — MOXIFLOXACIN HCL 0.5 % OP SOLN
OPHTHALMIC | Status: DC | PRN
  Administered 2018-12-13: 0.2 mL via OPHTHALMIC

## 2018-12-13 MED ORDER — POVIDONE-IODINE 5 % OP SOLN
OPHTHALMIC | Status: DC | PRN
  Administered 2018-12-13: 1 via OPHTHALMIC

## 2018-12-13 MED ORDER — FENTANYL CITRATE (PF) 100 MCG/2ML IJ SOLN
INTRAMUSCULAR | Status: AC
  Filled 2018-12-13: qty 2

## 2018-12-13 MED ORDER — TETRACAINE HCL 0.5 % OP SOLN
OPHTHALMIC | Status: AC
  Administered 2018-12-13: 1 [drp] via OPHTHALMIC
  Filled 2018-12-13: qty 4

## 2018-12-13 MED ORDER — LIDOCAINE HCL (PF) 4 % IJ SOLN
INTRAOCULAR | Status: DC | PRN
  Administered 2018-12-13: 4 mL via OPHTHALMIC

## 2018-12-13 MED ORDER — MOXIFLOXACIN HCL 0.5 % OP SOLN
OPHTHALMIC | Status: AC
  Filled 2018-12-13: qty 3

## 2018-12-13 MED ORDER — NA CHONDROIT SULF-NA HYALURON 40-17 MG/ML IO SOLN
INTRAOCULAR | Status: DC | PRN
  Administered 2018-12-13: 1 mL via INTRAOCULAR

## 2018-12-13 MED ORDER — ARMC OPHTHALMIC DILATING DROPS
1.0000 "application " | OPHTHALMIC | Status: AC
  Administered 2018-12-13: 1 via OPHTHALMIC

## 2018-12-13 MED ORDER — ONDANSETRON HCL 4 MG/2ML IJ SOLN
INTRAMUSCULAR | Status: DC | PRN
  Administered 2018-12-13: 4 mg via INTRAVENOUS

## 2018-12-13 MED ORDER — ARMC OPHTHALMIC DILATING DROPS
OPHTHALMIC | Status: AC
  Filled 2018-12-13: qty 0.5

## 2018-12-13 MED ORDER — MOXIFLOXACIN HCL 0.5 % OP SOLN: 1.0000 [drp] | Freq: Once | OPHTHALMIC | Status: DC

## 2018-12-13 MED ORDER — CARBACHOL 0.01 % IO SOLN
INTRAOCULAR | Status: DC | PRN
  Administered 2018-12-13: 0.5 mL via INTRAOCULAR

## 2018-12-13 MED ORDER — MIDAZOLAM HCL 2 MG/2ML IJ SOLN
INTRAMUSCULAR | Status: AC
  Filled 2018-12-13: qty 2

## 2018-12-13 MED ORDER — MIDAZOLAM HCL 2 MG/2ML IJ SOLN
INTRAMUSCULAR | Status: DC | PRN
  Administered 2018-12-13: 1 mg via INTRAVENOUS

## 2018-12-13 MED ORDER — FENTANYL CITRATE (PF) 100 MCG/2ML IJ SOLN
INTRAMUSCULAR | Status: DC | PRN
  Administered 2018-12-13: 50 ug via INTRAVENOUS

## 2018-12-13 MED ORDER — EPINEPHRINE PF 1 MG/ML IJ SOLN
INTRAOCULAR | Status: DC | PRN
  Administered 2018-12-13: 10:00:00 via OPHTHALMIC

## 2018-12-13 MED ORDER — SODIUM CHLORIDE 0.9 % IV SOLN
INTRAVENOUS | Status: DC
  Administered 2018-12-13: 08:00:00 via INTRAVENOUS

## 2018-12-13 MED ORDER — TETRACAINE HCL 0.5 % OP SOLN
1.0000 [drp] | Freq: Once | OPHTHALMIC | Status: AC
  Administered 2018-12-13: 08:00:00 1 [drp] via OPHTHALMIC

## 2018-12-13 SURGICAL SUPPLY — 16 items
GLOVE BIO SURGEON STRL SZ8 (GLOVE) ×2 IMPLANT
GLOVE BIOGEL M 6.5 STRL (GLOVE) ×2 IMPLANT
GLOVE SURG LX 8.0 MICRO (GLOVE) ×1
GLOVE SURG LX STRL 8.0 MICRO (GLOVE) ×1 IMPLANT
GOWN STRL REUS W/ TWL LRG LVL3 (GOWN DISPOSABLE) ×2 IMPLANT
GOWN STRL REUS W/TWL LRG LVL3 (GOWN DISPOSABLE) ×4
LABEL CATARACT MEDS ST (LABEL) ×2 IMPLANT
LENS IOL TECNIS ITEC 18.0 (Intraocular Lens) ×1 IMPLANT
PACK CATARACT (MISCELLANEOUS) ×2 IMPLANT
PACK CATARACT BRASINGTON LX (MISCELLANEOUS) ×2 IMPLANT
PACK EYE AFTER SURG (MISCELLANEOUS) ×2 IMPLANT
SOL BSS BAG (MISCELLANEOUS) ×2
SOLUTION BSS BAG (MISCELLANEOUS) ×1 IMPLANT
SYR 5ML LL (SYRINGE) ×2 IMPLANT
WATER STERILE IRR 250ML POUR (IV SOLUTION) ×2 IMPLANT
WIPE NON LINTING 3.25X3.25 (MISCELLANEOUS) ×2 IMPLANT

## 2018-12-13 NOTE — OR Nursing (Signed)
Patient arrived to [facility] in stable condition. He denies CP or feeling faint. HR is running in the high 30's to 65. BP 90/59. Dr. Rosey Bath notified. 12 lead EKG obtained. Dr. Rosey Bath has reviewed and reports it is okay to proceed with surgery today.I have instructed patient to follow up with his cardiologist for low HR and low BP. He reports he will do so soon.

## 2018-12-13 NOTE — Anesthesia Postprocedure Evaluation (Signed)
Anesthesia Post Note  Patient: [name]  Procedure(s) Performed: CATARACT EXTRACTION PHACO AND INTRAOCULAR LENS PLACEMENT (IOC) RIGHT (Right Eye)  Patient location during evaluation: PACU Anesthesia Type: MAC Level of consciousness: awake and alert Pain management: pain level controlled Vital Signs Assessment: post-procedure vital signs reviewed and stable Respiratory status: spontaneous breathing, nonlabored ventilation, respiratory function stable and patient connected to nasal cannula oxygen Cardiovascular status: stable and blood pressure returned to baseline Postop Assessment: no apparent nausea or vomiting Anesthetic complications: no     Last Vitals:  Vitals:   12/13/18 0811 12/13/18 0949  BP: (!) 91/59 105/73  Pulse: (!) 43 (!) 40  Resp: 18 14  Temp: (!) 36.3 C 36.8 C  SpO2: 100% 97%    Last Pain:  Vitals:   12/13/18 0949  TempSrc: Temporal  PainSc: 0-No pain                 Martha Clan

## 2018-12-13 NOTE — Anesthesia Preprocedure Evaluation (Signed)
Anesthesia Evaluation  Patient identified by MRN, date of birth, ID band Patient awake    Reviewed: Allergy & Precautions, H&P , NPO status , Patient's Chart, lab work & pertinent test results, reviewed documented beta blocker date and time   History of Anesthesia Complications Negative for: history of anesthetic complications  Airway Mallampati: I  TM Distance: >3 FB Neck ROM: full    Dental  (+) Dental Advidsory Given, Poor Dentition, Missing, Chipped   Pulmonary shortness of breath, with exertion and Long-Term Oxygen Therapy, COPD,  COPD inhaler and oxygen dependent, neg recent URI, Current Smoker,    Pulmonary exam normal        Cardiovascular Exercise Tolerance: Good (-) hypertension(-) angina+ CAD, + Past MI and +CHF  (-) Cardiac Stents and (-) CABG Normal cardiovascular exam+ dysrhythmias Atrial Fibrillation + pacemaker + Cardiac Defibrillator (-) Valvular Problems/Murmurs     Neuro/Psych neg Seizures PSYCHIATRIC DISORDERS Anxiety Depression CVA, No Residual Symptoms    GI/Hepatic Neg liver ROS, GERD  ,  Endo/Other  negative endocrine ROS  Renal/GU negative Renal ROS  negative genitourinary   Musculoskeletal   Abdominal   Peds  Hematology negative hematology ROS (+)   Anesthesia Other Findings Past Medical History: No date: Anxiety No date: Arthritis No date: Atrial septal defect No date: CHF (congestive heart failure) (HCC) No date: COPD (chronic obstructive pulmonary disease) (HCC)     Comment:  on 3L O2 - continuous No date: Coronary artery disease 02/2018: CVA (cerebral vascular accident) ([employer])     Comment:  "mild" - no deficits No date: Depression No date: Dyspnea     Comment:  with exertion No date: Dysrhythmia     Comment:  nonsustained VT, Afib No date: Dysrhythmias No date: GERD (gastroesophageal reflux disease) No date: Hyperlipidemia 08/22/2011: ICD (implantable  cardioverter-defibrillator) in place     Comment:  Research officer, political party No date: Malnourished [facility]) No date: Multiple myeloma ([place])     Comment:  multiple myeloma No date: Myocardial infarction ([place]) No date: Neuropathy     Comment:  feet, legs, hands No date: Presence of permanent cardiac pacemaker     Comment:  defib pacemaker No date: Substance abuse (HCC)   Reproductive/Obstetrics negative OB ROS                             Anesthesia Physical Anesthesia Plan  ASA: IV  Anesthesia Plan: MAC   Post-op Pain Management:    Induction: Intravenous  PONV Risk Score and Plan:   Airway Management Planned: Natural Airway and Nasal Cannula  Additional Equipment:   Intra-op Plan:   Post-operative Plan:   Informed Consent: I have reviewed the patients History and Physical, chart, labs and discussed the procedure including the risks, benefits and alternatives for the proposed anesthesia with the patient or authorized representative who has indicated his/her understanding and acceptance.     Dental Advisory Given  Plan Discussed with: Anesthesiologist, CRNA and Surgeon  Anesthesia Plan Comments:         Anesthesia Quick Evaluation

## 2018-12-13 NOTE — Discharge Instructions (Signed)
Eye Surgery Discharge Instructions    Expect mild scratchy sensation or mild soreness. DO NOT RUB YOUR EYE!  The day of surgery:  Minimal physical activity, but bed rest is not required  No reading, computer work, or close hand work  No bending, lifting, or straining.  May watch TV  For 24 hours:  No driving, legal decisions, or alcoholic beverages  Safety precautions  Eat anything you prefer: It is better to start with liquids, then soup then solid foods.  _____ Eye patch should be worn until postoperative exam tomorrow.  ____ Solar shield eyeglasses should be worn for comfort in the sunlight/patch while sleeping  Resume all regular medications including aspirin or Coumadin if these were discontinued prior to surgery. You may shower, bathe, shave, or wash your hair. Tylenol may be taken for mild discomfort.  Call your doctor if you experience significant pain, nausea, or vomiting, fever > 101 or other signs of infection. [phone] or [phone] Specific instructions:  Follow-up Information    Birder Robson, MD Follow up on 12/13/2018.   Specialty: Ophthalmology Why: appointment time is 3:00 pm Contact information: [address] [place] [id number] [phone]         AMBULATORY SURGERY  DISCHARGE INSTRUCTIONS   1) The drugs that you were given will stay in your system until tomorrow so for the next 24 hours you should not:  A) Drive an automobile B) Make any legal decisions C) Drink any alcoholic beverage   2) You may resume regular meals tomorrow.  Today it is better to start with liquids and gradually work up to solid foods.  You may eat anything you prefer, but it is better to start with liquids, then soup and crackers, and gradually work up to solid foods.   3) Please notify your doctor immediately if you have any unusual bleeding, trouble breathing, redness and pain at the surgery site, drainage, fever, or pain not relieved by  medication.    4) Additional Instructions:        Please contact your physician with any problems or Same Day Surgery at [phone], Monday through Friday 6 am to 4 pm, or [place] at [facility] number at [phone].

## 2018-12-13 NOTE — H&P (Signed)
All labs reviewed. Abnormal studies sent to patients PCP when indicated.  Previous H&P reviewed, patient examined, there are NO CHANGES.  [name] Porfilio11/20/20209:17 AM

## 2018-12-13 NOTE — Transfer of Care (Signed)
Immediate Anesthesia Transfer of Care Note  Patient: [name]  Procedure(s) Performed: CATARACT EXTRACTION PHACO AND INTRAOCULAR LENS PLACEMENT (IOC) RIGHT (Right Eye)  Patient Location: PACU  Anesthesia Type:MAC  Level of Consciousness: awake, alert  and oriented  Airway & Oxygen Therapy: Patient Spontanous Breathing and Patient connected to nasal cannula oxygen  Post-op Assessment: Report given to RN and Post -op Vital signs reviewed and stable  Post vital signs: Reviewed and stable  Last Vitals:  Vitals Value Taken Time  BP    Temp    Pulse    Resp    SpO2      Last Pain:  Vitals:   12/13/18 0811  TempSrc: Tympanic  PainSc: 0-No pain         Complications: No apparent anesthesia complications

## 2018-12-13 NOTE — Anesthesia Post-op Follow-up Note (Signed)
Anesthesia QCDR form completed.        

## 2018-12-13 NOTE — Op Note (Signed)
PREOPERATIVE DIAGNOSIS:  Nuclear sclerotic cataract of the right eye.   POSTOPERATIVE DIAGNOSIS:  NUCLEAR SCLEROTIC CATARACT RIGHT EYE   OPERATIVE PROCEDURE: Procedure(s): CATARACT EXTRACTION PHACO AND INTRAOCULAR LENS PLACEMENT ([place]) RIGHT   SURGEON:  Birder Robson, MD.   ANESTHESIA:  Anesthesiologist: Martha Clan, MD CRNA: Disser, Einar Grad, CRNA; Jerrye Noble, CRNA  1.      Managed anesthesia care. 2.      0.51ml of Shugarcaine was instilled in the eye following the paracentesis.   COMPLICATIONS:  None.   TECHNIQUE:   Stop and chop   DESCRIPTION OF PROCEDURE:  The patient was examined and consented in the preoperative holding area where the aforementioned topical anesthesia was applied to the right eye and then brought back to the Operating Room where the right eye was prepped and draped in the usual sterile ophthalmic fashion and a lid speculum was placed. A paracentesis was created with the side port blade and the anterior chamber was filled with viscoelastic. A near clear corneal incision was performed with the steel keratome. A continuous curvilinear capsulorrhexis was performed with a cystotome followed by the capsulorrhexis forceps. Hydrodissection and hydrodelineation were carried out with BSS on a blunt cannula. The lens was removed in a stop and chop  technique and the remaining cortical material was removed with the irrigation-aspiration handpiece. The capsular bag was inflated with viscoelastic and the Technis ZCB00  lens was placed in the capsular bag without complication. The remaining viscoelastic was removed from the eye with the irrigation-aspiration handpiece. The wounds were hydrated. The anterior chamber was flushed with Miostat and the eye was inflated to physiologic pressure. 0.3ml of Vigamox was placed in the anterior chamber. The wounds were found to be water tight. The eye was dressed with Vigamox. The patient was given protective glasses to wear throughout the  day and a shield with which to sleep tonight. The patient was also given drops with which to begin a drop regimen today and will follow-up with me in one day. Implant Name Type Inv. Item Serial No. Manufacturer Lot No. LRB No. Used Action  LENS IOL DIOP 18.0 - [id number] Intraocular Lens LENS IOL DIOP 18.0 [id number] AMO  Right 1 Implanted   Procedure(s) with comments: CATARACT EXTRACTION PHACO AND INTRAOCULAR LENS PLACEMENT (IOC) RIGHT (Right) - [place] 01:21.4 CDE 15.56 FLUID PACK LOT # [id number]  Electronically signed: Birder Robson 12/13/2018 9:47 AM

## 2018-12-16 LAB — MULTIPLE MYELOMA PANEL, SERUM
Albumin SerPl Elph-Mcnc: 3.4 g/dL (ref 2.9–4.4)
Albumin/Glob SerPl: 1.4 (ref 0.7–1.7)
Alpha 1: 0.3 g/dL (ref 0.0–0.4)
Alpha2 Glob SerPl Elph-Mcnc: 0.8 g/dL (ref 0.4–1.0)
B-Globulin SerPl Elph-Mcnc: 0.8 g/dL (ref 0.7–1.3)
Gamma Glob SerPl Elph-Mcnc: 0.7 g/dL (ref 0.4–1.8)
Globulin, Total: 2.5 g/dL (ref 2.2–3.9)
IgA: 62 mg/dL — ABNORMAL LOW (ref 90–386)
IgG (Immunoglobin G), Serum: 815 mg/dL (ref 603–1613)
IgM (Immunoglobulin M), Srm: 37 mg/dL (ref 20–172)
M Protein SerPl Elph-Mcnc: 0.2 g/dL — ABNORMAL HIGH
Total Protein ELP: 5.9 g/dL — ABNORMAL LOW (ref 6.0–8.5)

## 2018-12-17 MED FILL — NINLARO 3 MG CAPSULE: 3 | 28 days supply | Qty: 3 | Fill #4

## 2018-12-26 ENCOUNTER — Encounter: Payer: Self-pay | Admitting: *Deleted

## 2019-01-09 ENCOUNTER — Encounter: Payer: Medicaid Other | Admitting: Hospice and Palliative Medicine

## 2019-01-09 ENCOUNTER — Inpatient Hospital Stay: Payer: Medicaid Other

## 2019-01-09 ENCOUNTER — Inpatient Hospital Stay: Payer: Medicaid Other | Admitting: Internal Medicine

## 2019-01-13 ENCOUNTER — Other Ambulatory Visit: Payer: Self-pay | Admitting: Internal Medicine

## 2019-01-13 DIAGNOSIS — C9002 Multiple myeloma in relapse: Secondary | ICD-10-CM

## 2019-01-14 MED FILL — NINLARO 3 MG CAPSULE: 3 | 28 days supply | Qty: 3 | Fill #0

## 2019-01-20 ENCOUNTER — Other Ambulatory Visit: Payer: Self-pay | Admitting: Internal Medicine

## 2019-01-20 DIAGNOSIS — G893 Neoplasm related pain (acute) (chronic): Secondary | ICD-10-CM

## 2019-01-20 DIAGNOSIS — C9002 Multiple myeloma in relapse: Secondary | ICD-10-CM

## 2019-01-21 ENCOUNTER — Other Ambulatory Visit: Payer: Self-pay

## 2019-01-21 ENCOUNTER — Other Ambulatory Visit
Admission: RE | Admit: 2019-01-21 | Discharge: 2019-01-21 | Disposition: A | Discharge status: Home / Self Care | Payer: Medicaid Other | Source: Ambulatory Visit | Attending: Ophthalmology | Admitting: Ophthalmology

## 2019-01-21 DIAGNOSIS — Z20828 Contact with and (suspected) exposure to other viral communicable diseases: Secondary | ICD-10-CM | POA: Diagnosis not present

## 2019-01-21 DIAGNOSIS — Z01812 Encounter for preprocedural laboratory examination: Secondary | ICD-10-CM | POA: Diagnosis not present

## 2019-01-21 LAB — SARS CORONAVIRUS 2 (TAT 6-24 HRS): SARS Coronavirus 2: NEGATIVE

## 2019-01-23 ENCOUNTER — Ambulatory Visit: Payer: Medicaid Other | Admitting: Registered Nurse

## 2019-01-23 ENCOUNTER — Ambulatory Visit
Admission: RE | Admit: 2019-01-23 | Discharge: 2019-01-23 | Disposition: A | Discharge status: Home / Self Care | Payer: Medicaid Other | Attending: Ophthalmology | Admitting: Ophthalmology

## 2019-01-23 ENCOUNTER — Encounter
Admission: RE | Disposition: A | Discharge status: Home / Self Care | Payer: Self-pay | Source: Home / Self Care | Attending: Ophthalmology

## 2019-01-23 ENCOUNTER — Encounter: Payer: Self-pay | Admitting: Ophthalmology

## 2019-01-23 DIAGNOSIS — Z79899 Other long term (current) drug therapy: Secondary | ICD-10-CM | POA: Insufficient documentation

## 2019-01-23 DIAGNOSIS — Z7982 Long term (current) use of aspirin: Secondary | ICD-10-CM | POA: Insufficient documentation

## 2019-01-23 DIAGNOSIS — Z7951 Long term (current) use of inhaled steroids: Secondary | ICD-10-CM | POA: Insufficient documentation

## 2019-01-23 DIAGNOSIS — F17200 Nicotine dependence, unspecified, uncomplicated: Secondary | ICD-10-CM | POA: Insufficient documentation

## 2019-01-23 DIAGNOSIS — I252 Old myocardial infarction: Secondary | ICD-10-CM | POA: Diagnosis not present

## 2019-01-23 DIAGNOSIS — F329 Major depressive disorder, single episode, unspecified: Secondary | ICD-10-CM | POA: Diagnosis not present

## 2019-01-23 DIAGNOSIS — H2512 Age-related nuclear cataract, left eye: Secondary | ICD-10-CM | POA: Diagnosis not present

## 2019-01-23 DIAGNOSIS — C9000 Multiple myeloma not having achieved remission: Secondary | ICD-10-CM | POA: Insufficient documentation

## 2019-01-23 DIAGNOSIS — I2510 Atherosclerotic heart disease of native coronary artery without angina pectoris: Secondary | ICD-10-CM | POA: Diagnosis not present

## 2019-01-23 DIAGNOSIS — F419 Anxiety disorder, unspecified: Secondary | ICD-10-CM | POA: Insufficient documentation

## 2019-01-23 DIAGNOSIS — G629 Polyneuropathy, unspecified: Secondary | ICD-10-CM | POA: Insufficient documentation

## 2019-01-23 DIAGNOSIS — J449 Chronic obstructive pulmonary disease, unspecified: Secondary | ICD-10-CM | POA: Insufficient documentation

## 2019-01-23 DIAGNOSIS — Z8673 Personal history of transient ischemic attack (TIA), and cerebral infarction without residual deficits: Secondary | ICD-10-CM | POA: Diagnosis not present

## 2019-01-23 DIAGNOSIS — Z950 Presence of cardiac pacemaker: Secondary | ICD-10-CM | POA: Insufficient documentation

## 2019-01-23 DIAGNOSIS — Z9981 Dependence on supplemental oxygen: Secondary | ICD-10-CM | POA: Diagnosis not present

## 2019-01-23 DIAGNOSIS — I509 Heart failure, unspecified: Secondary | ICD-10-CM | POA: Insufficient documentation

## 2019-01-23 DIAGNOSIS — Z7952 Long term (current) use of systemic steroids: Secondary | ICD-10-CM | POA: Diagnosis not present

## 2019-01-23 HISTORY — PX: CATARACT EXTRACTION W/PHACO: SHX586

## 2019-01-23 HISTORY — DX: Other allergic rhinitis: J30.89

## 2019-01-23 SURGERY — PHACOEMULSIFICATION, CATARACT, WITH IOL INSERTION
Anesthesia: Monitor Anesthesia Care | Site: Eye | Laterality: Left

## 2019-01-23 MED ORDER — TETRACAINE HCL 0.5 % OP SOLN
1.0000 [drp] | Freq: Two times a day (BID) | OPHTHALMIC | Status: AC
  Administered 2019-01-23: 09:00:00 1 [drp] via OPHTHALMIC

## 2019-01-23 MED ORDER — NA CHONDROIT SULF-NA HYALURON 40-17 MG/ML IO SOLN
INTRAOCULAR | Status: DC | PRN
  Administered 2019-01-23: 1 mL via INTRAOCULAR

## 2019-01-23 MED ORDER — LIDOCAINE HCL (PF) 4 % IJ SOLN
INTRAOCULAR | Status: DC | PRN
  Administered 2019-01-23: 4 mL via OPHTHALMIC

## 2019-01-23 MED ORDER — ARMC OPHTHALMIC DILATING DROPS
OPHTHALMIC | Status: AC
  Administered 2019-01-23: 1 via OPHTHALMIC
  Filled 2019-01-23: qty 0.5

## 2019-01-23 MED ORDER — MOXIFLOXACIN HCL 0.5 % OP SOLN
OPHTHALMIC | Status: DC | PRN
  Administered 2019-01-23: 0.2 mL via OPHTHALMIC

## 2019-01-23 MED ORDER — MIDAZOLAM HCL 2 MG/2ML IJ SOLN
INTRAMUSCULAR | Status: AC
  Filled 2019-01-23: qty 2

## 2019-01-23 MED ORDER — FENTANYL CITRATE (PF) 100 MCG/2ML IJ SOLN
INTRAMUSCULAR | Status: AC
  Filled 2019-01-23: qty 2

## 2019-01-23 MED ORDER — CARBACHOL 0.01 % IO SOLN
INTRAOCULAR | Status: DC | PRN
  Administered 2019-01-23: 0.5 mL via INTRAOCULAR

## 2019-01-23 MED ORDER — MIDAZOLAM HCL 2 MG/2ML IJ SOLN
INTRAMUSCULAR | Status: DC | PRN
  Administered 2019-01-23: .5 mg via INTRAVENOUS
  Administered 2019-01-23: 1 mg via INTRAVENOUS
  Administered 2019-01-23: .5 mg via INTRAVENOUS

## 2019-01-23 MED ORDER — POVIDONE-IODINE 5 % OP SOLN
OPHTHALMIC | Status: DC | PRN
  Administered 2019-01-23: 1 via OPHTHALMIC

## 2019-01-23 MED ORDER — SODIUM CHLORIDE 0.9 % IV SOLN: INTRAVENOUS | Status: DC

## 2019-01-23 MED ORDER — MOXIFLOXACIN HCL 0.5 % OP SOLN
OPHTHALMIC | Status: AC
  Filled 2019-01-23: qty 3

## 2019-01-23 MED ORDER — ONDANSETRON HCL 4 MG/2ML IJ SOLN
INTRAMUSCULAR | Status: DC | PRN
  Administered 2019-01-23: 4 mg via INTRAVENOUS

## 2019-01-23 MED ORDER — ARMC OPHTHALMIC DILATING DROPS
1.0000 "application " | OPHTHALMIC | Status: AC
  Administered 2019-01-23 (×2): 1 via OPHTHALMIC

## 2019-01-23 MED ORDER — TETRACAINE HCL 0.5 % OP SOLN
OPHTHALMIC | Status: AC
  Administered 2019-01-23: 09:00:00 1 [drp] via OPHTHALMIC
  Filled 2019-01-23: qty 4

## 2019-01-23 MED ORDER — EPINEPHRINE PF 1 MG/ML IJ SOLN: INTRAOCULAR | Status: DC | PRN

## 2019-01-23 MED ORDER — ONDANSETRON HCL 4 MG/2ML IJ SOLN
INTRAMUSCULAR | Status: AC
  Filled 2019-01-23: qty 2

## 2019-01-23 MED ORDER — MOXIFLOXACIN HCL 0.5 % OP SOLN: 1.0000 [drp] | Freq: Once | OPHTHALMIC | Status: DC

## 2019-01-23 MED ORDER — FENTANYL CITRATE (PF) 100 MCG/2ML IJ SOLN
INTRAMUSCULAR | Status: DC | PRN
  Administered 2019-01-23 (×2): 25 ug via INTRAVENOUS

## 2019-01-23 SURGICAL SUPPLY — 16 items
GLOVE BIO SURGEON STRL SZ8 (GLOVE) ×2 IMPLANT
GLOVE BIOGEL M 6.5 STRL (GLOVE) ×2 IMPLANT
GLOVE SURG LX 8.0 MICRO (GLOVE) ×1
GLOVE SURG LX STRL 8.0 MICRO (GLOVE) ×1 IMPLANT
GOWN STRL REUS W/ TWL LRG LVL3 (GOWN DISPOSABLE) ×2 IMPLANT
GOWN STRL REUS W/TWL LRG LVL3 (GOWN DISPOSABLE) ×4
LABEL CATARACT MEDS ST (LABEL) ×2 IMPLANT
LENS IOL TECNIS ITEC 17.0 (Intraocular Lens) ×1 IMPLANT
PACK CATARACT (MISCELLANEOUS) ×2 IMPLANT
PACK CATARACT BRASINGTON LX (MISCELLANEOUS) ×2 IMPLANT
PACK EYE AFTER SURG (MISCELLANEOUS) ×2 IMPLANT
SOL BSS BAG (MISCELLANEOUS) ×2
SOLUTION BSS BAG (MISCELLANEOUS) ×1 IMPLANT
SYR 5ML LL (SYRINGE) ×2 IMPLANT
WATER STERILE IRR 250ML POUR (IV SOLUTION) ×2 IMPLANT
WIPE NON LINTING 3.25X3.25 (MISCELLANEOUS) ×2 IMPLANT

## 2019-01-23 NOTE — Anesthesia Preprocedure Evaluation (Addendum)
Anesthesia Evaluation  Patient identified by MRN, date of birth, ID band Patient awake    Reviewed: Allergy & Precautions, H&P , NPO status , reviewed documented beta blocker date and time   Airway Mallampati: II  TM Distance: >3 FB Neck ROM: full    Dental  (+) Chipped, Missing, Poor Dentition   Pulmonary shortness of breath, pneumonia, resolved, COPD, Current Smoker and Patient abstained from smoking.,    Pulmonary exam normal        Cardiovascular + CAD, + Past MI and +CHF  Normal cardiovascular exam+ dysrhythmias + pacemaker + Cardiac Defibrillator   02/2018 ECHO IMPRESSIONS    1. The left ventricle has normal systolic function with an ejection fraction of 60-65%. The cavity size was normal. Left ventricular diastolic Doppler parameters are consistent with impaired relaxation No evidence of left ventricular regional wall  motion abnormalities.  2. The right ventricle has normal systolic function. The cavity was normal. There is no increase in right ventricular wall thickness.  3. The mitral valve is myxomatous. Mild thickening of the mitral valve leaflet.  4. The tricuspid valve is normal in structure.  5. The aortic valve was not well visualized.  6. The pulmonic valve was normal in structure.  7. No evidence of left ventricular regional wall motion abnormalities.  8. Right atrial pressure is estimated at 10 mmHg.  PVCs on EKG   Neuro/Psych PSYCHIATRIC DISORDERS Anxiety Depression CVA    GI/Hepatic GERD  Controlled,  Endo/Other    Renal/GU      Musculoskeletal  (+) Arthritis ,   Abdominal   Peds  Hematology   Anesthesia Other Findings Past Medical History: No date: Anxiety No date: Arthritis No date: Atrial septal defect No date: CHF (congestive heart failure) (HCC) No date: COPD (chronic obstructive pulmonary disease) (HCC)     Comment:  on 3L O2 - continuous No date: Coronary artery  disease 02/2018: CVA (cerebral vascular accident) ([place])     Comment:  "mild" - no deficits No date: Depression No date: Dyspnea     Comment:  with exertion No date: Dysrhythmia     Comment:  nonsustained VT, Afib No date: Dysrhythmias No date: Environmental and seasonal allergies No date: GERD (gastroesophageal reflux disease) No date: Hyperlipidemia 08/22/2011: ICD (implantable cardioverter-defibrillator) in place     Comment:  Research officer, political party No date: Malnourished [facility]) No date: Multiple myeloma ([place])     Comment:  multiple myeloma No date: Myocardial infarction ([place]) No date: Neuropathy     Comment:  feet, legs, hands No date: Presence of permanent cardiac pacemaker     Comment:  defib pacemaker No date: Substance abuse ([place])  Past Surgical History: 08/22/2011: CARDIAC DEFIBRILLATOR PLACEMENT     Comment:  [employer] single chamber placed by Dr Marchia Meiers, [facility] 12/13/2018: CATARACT EXTRACTION W/PHACO; Right     Comment:  Procedure: CATARACT EXTRACTION PHACO AND INTRAOCULAR               LENS PLACEMENT ([place]) RIGHT;  Surgeon: Birder Robson,               MD;  Location: [facility] ORS;  Service: Ophthalmology;                Laterality: Right;  [place] 01:21.4 CDE 15.56 FLUID PACK LOT              # [id number] No date: TOOTH  EXTRACTION  BMI    Body Mass Index: 16.00 kg/m      Reproductive/Obstetrics                             Anesthesia Physical Anesthesia Plan  ASA: IV  Anesthesia Plan: MAC   Post-op Pain Management:    Induction: Intravenous  PONV Risk Score and Plan: 0 and TIVA and Midazolam  Airway Management Planned: Nasal Cannula and Natural Airway  Additional Equipment:   Intra-op Plan:   Post-operative Plan:   Informed Consent: I have reviewed the patients History and Physical, chart, labs and discussed the procedure including the risks, benefits and alternatives for the proposed  anesthesia with the patient or authorized representative who has indicated his/her understanding and acceptance.     Dental Advisory Given  Plan Discussed with: CRNA  Anesthesia Plan Comments:        Anesthesia Quick Evaluation

## 2019-01-23 NOTE — Transfer of Care (Signed)
Immediate Anesthesia Transfer of Care Note  Patient: [name]  Procedure(s) Performed: CATARACT EXTRACTION PHACO AND INTRAOCULAR LENS PLACEMENT (IOC) LEFT (Left Eye)  Patient Location: PACU  Anesthesia Type:MAC  Level of Consciousness: awake, drowsy and patient cooperative  Airway & Oxygen Therapy: Patient Spontanous Breathing  Post-op Assessment: Report given to RN and Post -op Vital signs reviewed and stable  Post vital signs: Reviewed and stable  Last Vitals:  Vitals Value Taken Time  BP    Temp    Pulse    Resp    SpO2      Last Pain:  Vitals:   01/23/19 0824  TempSrc: Tympanic  PainSc: 0-No pain         Complications: No apparent anesthesia complications

## 2019-01-23 NOTE — Anesthesia Post-op Follow-up Note (Signed)
Anesthesia QCDR form completed.        

## 2019-01-23 NOTE — Op Note (Signed)
PREOPERATIVE DIAGNOSIS:  Nuclear sclerotic cataract of the left eye.   POSTOPERATIVE DIAGNOSIS:  Nuclear sclerotic cataract of the left eye.   OPERATIVE PROCEDURE: Procedure(s): CATARACT EXTRACTION PHACO AND INTRAOCULAR LENS PLACEMENT (IOC) LEFT   SURGEON:  Birder Robson, MD.   ANESTHESIA:  Anesthesiologist: Alphonsus Sias, MD CRNA: Lia Foyer, CRNA  1.      Managed anesthesia care. 2.     0.30ml of Shugarcaine was instilled following the paracentesis   COMPLICATIONS:  None.   TECHNIQUE:   Stop and chop   DESCRIPTION OF PROCEDURE:  The patient was examined and consented in the preoperative holding area where the aforementioned topical anesthesia was applied to the left eye and then brought back to the Operating Room where the left eye was prepped and draped in the usual sterile ophthalmic fashion and a lid speculum was placed. A paracentesis was created with the side port blade and the anterior chamber was filled with viscoelastic. A near clear corneal incision was performed with the steel keratome. A continuous curvilinear capsulorrhexis was performed with a cystotome followed by the capsulorrhexis forceps. Hydrodissection and hydrodelineation were carried out with BSS on a blunt cannula. The lens was removed in a stop and chop  technique and the remaining cortical material was removed with the irrigation-aspiration handpiece. The capsular bag was inflated with viscoelastic and the Technis ZCB00 lens was placed in the capsular bag without complication. The remaining viscoelastic was removed from the eye with the irrigation-aspiration handpiece. The wounds were hydrated. The anterior chamber was flushed with Miostat and the eye was inflated to physiologic pressure. 0.71ml Vigamox was placed in the anterior chamber. The wounds were found to be water tight. The eye was dressed with Vigamox. The patient was given protective glasses to wear throughout the day and a shield with which to  sleep tonight. The patient was also given drops with which to begin a drop regimen today and will follow-up with me in one day. Implant Name Type Inv. Item Serial No. Manufacturer Lot No. LRB No. Used Action  LENS IOL DIOP 17.0 - [id number] Intraocular Lens LENS IOL DIOP 17.0 [id number] AMO  Left 1 Implanted    Procedure(s) with comments: CATARACT EXTRACTION PHACO AND INTRAOCULAR LENS PLACEMENT (IOC) LEFT (Left) - [place] 01:13.5 CDE 13.33 Fluid Pack lot # [id number]  Electronically signed: Birder Robson 01/23/2019 9:20 AM

## 2019-01-23 NOTE — Discharge Instructions (Signed)
AMBULATORY SURGERY  °DISCHARGE INSTRUCTIONS ° ° °1) The drugs that you were given will stay in your system until tomorrow so for the next 24 hours you should not: ° °A) Drive an automobile °B) Make any legal decisions °C) Drink any alcoholic beverage ° ° °2) You may resume regular meals tomorrow.  Today it is better to start with liquids and gradually work up to solid foods. ° °You may eat anything you prefer, but it is better to start with liquids, then soup and crackers, and gradually work up to solid foods. ° ° °3) Please notify your doctor immediately if you have any unusual bleeding, trouble breathing, redness and pain at the surgery site, drainage, fever, or pain not relieved by medication. ° ° ° °4) Additional Instructions: ° ° ° ° ° ° ° °Please contact your physician with any problems or Same Day Surgery at [phone], Monday through Friday 6 am to 4 pm, or [place] at [place] [facility] number at [phone].AMBULATORY SURGERY  °DISCHARGE INSTRUCTIONS ° ° °5) The drugs that you were given will stay in your system until tomorrow so for the next 24 hours you should not: ° °D) Drive an automobile °E) Make any legal decisions °F) Drink any alcoholic beverage ° ° °6) You may resume regular meals tomorrow.  Today it is better to start with liquids and gradually work up to solid foods. ° °You may eat anything you prefer, but it is better to start with liquids, then soup and crackers, and gradually work up to solid foods. ° ° °7) Please notify your doctor immediately if you have any unusual bleeding, trouble breathing, redness and pain at the surgery site, drainage, fever, or pain not relieved by medication. ° ° ° °8) Additional Instructions: ° ° ° ° ° ° ° °Please contact your physician with any problems or Same Day Surgery at [phone], Monday through Friday 6 am to 4 pm, or [place] at [place] [facility] number at [phone]. °

## 2019-01-23 NOTE — H&P (Signed)
All labs reviewed. Abnormal studies sent to patients PCP when indicated.  Previous H&P reviewed, patient examined, there are NO CHANGES.  [name] Kafer Porfilio12/31/20208:54 AM

## 2019-01-23 NOTE — Anesthesia Postprocedure Evaluation (Signed)
Anesthesia Post Note  Patient: [name]  Procedure(s) Performed: CATARACT EXTRACTION PHACO AND INTRAOCULAR LENS PLACEMENT (IOC) LEFT (Left Eye)  Patient location during evaluation: Phase II Anesthesia Type: MAC Level of consciousness: awake and alert Pain management: pain level controlled Vital Signs Assessment: post-procedure vital signs reviewed and stable Respiratory status: spontaneous breathing, nonlabored ventilation and respiratory function stable Cardiovascular status: blood pressure returned to baseline and stable Postop Assessment: no apparent nausea or vomiting Anesthetic complications: no     Last Vitals:  Vitals:   01/23/19 0901 01/23/19 0945  BP: (!) 95/45 102/60  Pulse: 60 (!) 58  Resp: 18 17  Temp: (!) 36.3 C (!) 36.2 C  SpO2: 100%     Last Pain:  Vitals:   01/23/19 0945  TempSrc:   PainSc: 0-No pain                 Alphonsus Sias

## 2019-02-03 ENCOUNTER — Inpatient Hospital Stay: Payer: Medicaid Other

## 2019-02-03 ENCOUNTER — Inpatient Hospital Stay: Payer: Medicaid Other | Admitting: Internal Medicine

## 2019-02-03 NOTE — Progress Notes (Deleted)
[place] OFFICE PROGRESS NOTE  Patient Care Team: Petra Kuba, MD as PCP - General (Family Medicine)  Cancer Staging No matching staging information was found for the patient.   Oncology History Overview Note  # SEP 2015- MULTIPLE MYELOMA [multiple PET pos Bone lesions; hypercalcemia s/p BMBx; FISH- Aneuploidy - gain of chromosome 7,9,15,FGFR3/4p16.3, and CCND1/11q13. Loss of MAF/16q23.1), cytogenetics normal 46XY] s/p Vel-Dex-Rev-Zometa; Excellent PR;   # MARCH 2016- REV-DEX ; FEB 2017-[discn Dex] Rev 25 mg 3 w On & 1 w Off; March 2017- M- protein 0.4gm/dl; K/L= 7.9; cont Rev;   # Revlimid HELD June 2018 [sec to ON[facility]]  # AUG 2019- RECURRENCE- NINLARO-DEX  # March 2017-  Left Middle Lobe cavitary lesion- ~71m- repeat Ct in 3-428m# AUG 2017- ONJ [[name], [place] #  ? [facility]- [phone] not follow up ]- DISCONT Zometa.; NO HBO sec to   # Chronic pain/Anxiety [ortho]  # COPD/smoking/ [[facility]- not candidate for BMT- sec to co-morbidities/poor nutritional status];    # MARCH 2020-PAIN CONTRACT: BREACH-  DECLINE ANY NARCOTICS/BENZO  ---------------------------------------------------------------  DIAGNOSIS: MULTIPLE MYELOMA  STAGE: RECURRENT  ;GOALS: CONTROL  CURRENT/MOST RECENT THERAPY; AUG 2019- NINLARO-DEX    Multiple myeloma in relapse ([facility]     INTERVAL HISTORY:  [name] 89.o.  male pleasant patient above history of recurrent/relapse multiple myeloma-currently on Ninlaro-dex is here for follow-up.  Patient states his back pain is better controlled.  He is currently on the buprenorphine pain patch.  He is not needing any breakthrough pain medication.  Chronic shortness of breath chronic cough not any worse.  Chronic mild nausea-currently stable.  Denies any significant diarrhea.   Review of Systems  Constitutional: Positive for malaise/fatigue. Negative for chills, diaphoresis and fever.  HENT: Negative for nosebleeds and sore  throat.   Eyes: Negative for double vision.  Respiratory: Positive for cough and shortness of breath. Negative for hemoptysis and sputum production.   Cardiovascular: Negative for chest pain, palpitations, orthopnea and leg swelling.  Gastrointestinal: Negative for abdominal pain, blood in stool, constipation, diarrhea, heartburn, melena and vomiting.  Genitourinary: Negative for dysuria, frequency and urgency.  Musculoskeletal: Positive for back pain and joint pain.  Skin: Negative.  Negative for itching and rash.  Neurological: Positive for tingling. Negative for dizziness, focal weakness, weakness and headaches.  Endo/Heme/Allergies: Does not bruise/bleed easily.  Psychiatric/Behavioral: Negative for depression. The patient is not nervous/anxious and does not have insomnia.       PAST MEDICAL HISTORY :  Past Medical History:  Diagnosis Date  . Anxiety   . Arthritis   . Atrial septal defect   . CHF (congestive heart failure) ([facility]  . COPD (chronic obstructive pulmonary disease) (HCC)    on 3L O2 - continuous  . Coronary artery disease   . CVA (cerebral vascular accident) ([facility]   "mild" - no deficits  . Depression   . Dyspnea    with exertion  . Dysrhythmia    nonsustained VT, Afib  . Dysrhythmias   . Environmental and seasonal allergies   . GERD (gastroesophageal reflux disease)   . Hyperlipidemia   . ICD (implantable cardioverter-defibrillator) in place [facility]/2011   [facility] ([facility] [place]  . Multiple myeloma (HCC)    multiple myeloma  . Myocardial infarction ([facility] [place]  . Neuropathy    feet, legs, hands  . Presence of permanent cardiac pacemaker    defib pacemaker  . Substance  abuse ([place])     PAST SURGICAL HISTORY :   Past Surgical History:  Procedure Laterality Date  . CARDIAC DEFIBRILLATOR PLACEMENT  08/22/2011   [employer] single chamber placed by Dr Marchia Meiers, [place]  . CATARACT EXTRACTION W/PHACO Right  12/13/2018   Procedure: CATARACT EXTRACTION PHACO AND INTRAOCULAR LENS PLACEMENT ([place]) RIGHT;  Surgeon: Birder Robson, MD;  Location: [facility] ORS;  Service: Ophthalmology;  Laterality: Right;  [place] 01:21.4 CDE 15.56 FLUID PACK LOT # [id number]  . CATARACT EXTRACTION W/PHACO Left 01/23/2019   Procedure: CATARACT EXTRACTION PHACO AND INTRAOCULAR LENS PLACEMENT ([place]) LEFT;  Surgeon: Birder Robson, MD;  Location: [facility] ORS;  Service: Ophthalmology;  Laterality: Left;  [place] 01:13.5 CDE 13.33 Fluid Pack lot # [id number]  . TOOTH EXTRACTION      FAMILY HISTORY :   Family History  Problem Relation Age of Onset  . COPD Mother   . CAD Mother   . Heart attack Father   . Prostate cancer Maternal Grandfather   . Kidney cancer Neg Hx   . Bladder Cancer Neg Hx     SOCIAL HISTORY:   Social History   Tobacco Use  . Smoking status: Current Every Day Smoker    Packs/day: 0.50    Years: 40.00    Pack years: 20.00    Types: Cigarettes  . Smokeless tobacco: Never Used  Substance Use Topics  . Alcohol use: No    Alcohol/week: 0.0 standard drinks  . Drug use: No    ALLERGIES:  has No Known Allergies.  MEDICATIONS:  Current Outpatient Medications  Medication Sig Dispense Refill  . acetaminophen (TYLENOL) 325 MG tablet Take 2 tablets (650 mg total) by mouth every 6 (six) hours as needed for mild pain (or Fever >/= 101). (Patient taking differently: Take 650 mg by mouth every 6 (six) hours as needed for mild pain (or Fever >/= 101). )    . aspirin EC 81 MG EC tablet Take 1 tablet (81 mg total) by mouth daily.    . Buprenorphine (BUTRANS) 15 MCG/HR PTWK Place 1 patch onto the skin once a week.    Marland Kitchen dexamethasone (DECADRON) 4 MG tablet TAKE THREE TABLETS BY MOUTH ONCE A WEEK WITH BREAKFAST (Patient taking differently: Take 12 mg by mouth every Wednesday. ) 30 tablet 4  . DULoxetine (CYMBALTA) 30 MG capsule Take 1 capsule (30 mg total) by mouth daily. 90 capsule 3  . DUREZOL 0.05 % EMUL Place 1 drop  into the right eye daily.    . Fluticasone-Salmeterol (ADVAIR DISKUS) 250-50 MCG/DOSE AEPB Inhale 1 puff into the lungs 2 (two) times daily. 3 each 2  . gabapentin (NEURONTIN) 300 MG capsule TAKE ONE CAPSULE BY MOUTH THREE TIMES DAILY 90 capsule 0  . ipratropium-albuterol (DUONEB) 0.5-2.5 (3) MG/3ML SOLN Take 3 mLs by nebulization every 4 (four) hours as needed. (Patient taking differently: Take 3 mLs by nebulization every 4 (four) hours as needed. Asthma) 360 mL 3  . ixazomib citrate (NINLARO) 3 MG capsule Take 1 capsule (3 mg total) by mouth every Wednesday. Take for 3 weeks on, then 1 week off. Take on an empty stomach 1hr before or 2hrs after food 3 capsule 4  . metoprolol tartrate (LOPRESSOR) 50 MG tablet Take 50 mg by mouth daily.     . naloxone (NARCAN) nasal spray 4 mg/0.1 mL For Opioid Overdose: Call 911 and administer a single spray of Narcan in one nostril. Repeat every 77mns as needed if no or minimal response (Patient  not taking: Reported on 01/23/2019) 1 kit 2  . ondansetron (ZOFRAN) 8 MG tablet TAKE ONE TABLET BY MOUTH EVERY 8 HOURS AS NEEDED (Patient taking differently: Take 8 mg by mouth every 8 (eight) hours as needed for nausea or vomiting. ) 60 tablet 3  . OXYGEN Inhale 3 L into the lungs continuous.    Marland Kitchen PROAIR HFA 108 (90 Base) MCG/ACT inhaler INHALE 2 PUFFS BY MOUTH INTO THE LUNGS EVERY 6 HOURS AS NEEDED FOR WHEEZING OR SHORTNESS OF BREATH (Patient taking differently: Inhale 2 puffs into the lungs every 6 (six) hours as needed for wheezing or shortness of breath. ) 8.5 g 6  . tiotropium (SPIRIVA HANDIHALER) 18 MCG inhalation capsule Place 1 capsule (18 mcg total) into inhaler and inhale daily. 30 capsule 6   No current facility-administered medications for this visit.    PHYSICAL EXAMINATION: ECOG PERFORMANCE STATUS: 2 - Symptomatic, <50% confined to bed  There were no vitals taken for this visit.  There were no vitals filed for this visit.  Physical Exam   Constitutional: He is oriented to person, place, and time.  Cachectic appearing Caucasian male patient; 2 L nasal cannula.  He is alone.  He is walking by himself.  HENT:  Head: Normocephalic and atraumatic.  Mouth/Throat: Oropharynx is clear and moist. No oropharyngeal exudate.  Eyes: Pupils are equal, round, and reactive to light.  Cardiovascular: Normal rate and regular rhythm.  Pulmonary/Chest: No respiratory distress. He has wheezes.  Bilateral decreased air entry.  Abdominal: Soft. Bowel sounds are normal. He exhibits no distension and no mass. There is no abdominal tenderness. There is no rebound and no guarding.  Musculoskeletal:        General: No tenderness or edema. Normal range of motion.     Cervical back: Normal range of motion and neck supple.  Neurological: He is alert and oriented to person, place, and time.  Skin: Skin is warm.  Psychiatric: Affect normal.       LABORATORY DATA:  I have reviewed the data as listed    Component Value Date/Time   NA 131 (L) 12/12/2018 1122   NA 135 05/12/2014 1343   K 3.6 12/12/2018 1122   K 3.3 (L) 05/12/2014 1343   CL 99 12/12/2018 1122   CL 101 05/12/2014 1343   CO2 25 12/12/2018 1122   CO2 27 05/12/2014 1343   GLUCOSE 117 (H) 12/12/2018 1122   GLUCOSE 77 05/12/2014 1343   BUN 13 12/12/2018 1122   BUN 9 05/12/2014 1343   CREATININE 0.81 12/12/2018 1122   CREATININE 0.89 05/12/2014 1343   CALCIUM 8.7 (L) 12/12/2018 1122   CALCIUM 8.8 (L) 05/12/2014 1343   PROT 6.8 12/12/2018 1122   PROT 5.9 (L) 02/17/2014 1405   ALBUMIN 3.7 12/12/2018 1122   ALBUMIN 2.8 (L) 02/17/2014 1405   AST 17 12/12/2018 1122   AST 9 (L) 02/17/2014 1405   ALT 10 12/12/2018 1122   ALT 12 (L) 02/17/2014 1405   ALKPHOS 39 12/12/2018 1122   ALKPHOS 44 (L) 02/17/2014 1405   BILITOT 0.5 12/12/2018 1122   BILITOT 0.2 02/17/2014 1405   GFRNONAA >60 12/12/2018 1122   GFRNONAA >60 05/12/2014 1343   GFRAA >60 12/12/2018 1122   GFRAA >60 05/12/2014  1343    No results found for: SPEP, UPEP  Lab Results  Component Value Date   WBC 12.8 (H) 12/12/2018   NEUTROABS 10.4 (H) 12/12/2018   HGB 12.1 (L) 12/12/2018   HCT 37.2 (L)  12/12/2018   MCV 87.9 12/12/2018   PLT 251 12/12/2018      Chemistry      Component Value Date/Time   NA 131 (L) 12/12/2018 1122   NA 135 05/12/2014 1343   K 3.6 12/12/2018 1122   K 3.3 (L) 05/12/2014 1343   CL 99 12/12/2018 1122   CL 101 05/12/2014 1343   CO2 25 12/12/2018 1122   CO2 27 05/12/2014 1343   BUN 13 12/12/2018 1122   BUN 9 05/12/2014 1343   CREATININE 0.81 12/12/2018 1122   CREATININE 0.89 05/12/2014 1343      Component Value Date/Time   CALCIUM 8.7 (L) 12/12/2018 1122   CALCIUM 8.8 (L) 05/12/2014 1343   ALKPHOS 39 12/12/2018 1122   ALKPHOS 44 (L) 02/17/2014 1405   AST 17 12/12/2018 1122   AST 9 (L) 02/17/2014 1405   ALT 10 12/12/2018 1122   ALT 12 (L) 02/17/2014 1405   BILITOT 0.5 12/12/2018 1122   BILITOT 0.2 02/17/2014 1405       RADIOGRAPHIC STUDIES: I have personally reviewed the radiological images as listed and agreed with the findings in the report. No results found.   ASSESSMENT & PLAN:  No problem-specific Assessment & Plan notes found for this encounter.   No orders of the defined types were placed in this encounter.  All questions were answered. The patient knows to call the clinic with any problems, questions or concerns.      Cammie Sickle, MD 02/03/2019 7:59 AM

## 2019-02-03 NOTE — Assessment & Plan Note (Deleted)
#  MULTIPLE MYELOMA-AUG 2019 recurrent;   Oct 2019- PET- no lytic lesions noted. STABLE.   # Currently on Ninlaro-Dex [start again 01/02/2018]- PR noted; OCT  2020- 0.2 -Kappa light chains-slightly abnormal 3.0.  Continue Ninlaro-Dex weekly.    #Bilateral subcentimeter lung nodules -AUG 2020- CT overall stable; will order imaging at next visit.   # COPD- advair/spiriva/O2- STABLE  # Chronic nausea-question secondary to Ninlaro. STABLE.   #Chronic pain-  STABLE; 15 mgcurrently on buprenorphine pain patch once a week; pain management]; ; patient not needing any prn medications.   #Neuropathy-STABLE. gabapentin to 300 TID; refilled .  # DISPOSITION: # follow up in 4 weeks- cbc/cmp/MMpanel/K-L light chain-- Dr.B

## 2019-02-05 ENCOUNTER — Other Ambulatory Visit: Payer: Self-pay

## 2019-02-05 ENCOUNTER — Encounter: Payer: Self-pay | Admitting: Emergency Medicine

## 2019-02-05 ENCOUNTER — Emergency Department: Payer: Medicaid Other

## 2019-02-05 ENCOUNTER — Emergency Department
Admission: EM | Admit: 2019-02-05 | Discharge: 2019-02-05 | Disposition: A | Discharge status: Home / Self Care | Payer: Medicaid Other | Attending: Emergency Medicine | Admitting: Emergency Medicine

## 2019-02-05 DIAGNOSIS — Z79899 Other long term (current) drug therapy: Secondary | ICD-10-CM | POA: Insufficient documentation

## 2019-02-05 DIAGNOSIS — F17210 Nicotine dependence, cigarettes, uncomplicated: Secondary | ICD-10-CM | POA: Insufficient documentation

## 2019-02-05 DIAGNOSIS — T82897A Other specified complication of cardiac prosthetic devices, implants and grafts, initial encounter: Secondary | ICD-10-CM | POA: Insufficient documentation

## 2019-02-05 DIAGNOSIS — I498 Other specified cardiac arrhythmias: Secondary | ICD-10-CM | POA: Insufficient documentation

## 2019-02-05 DIAGNOSIS — Z4502 Encounter for adjustment and management of automatic implantable cardiac defibrillator: Secondary | ICD-10-CM | POA: Diagnosis present

## 2019-02-05 DIAGNOSIS — I509 Heart failure, unspecified: Secondary | ICD-10-CM | POA: Diagnosis not present

## 2019-02-05 DIAGNOSIS — Y710 Diagnostic and monitoring cardiovascular devices associated with adverse incidents: Secondary | ICD-10-CM | POA: Diagnosis not present

## 2019-02-05 DIAGNOSIS — I259 Chronic ischemic heart disease, unspecified: Secondary | ICD-10-CM | POA: Insufficient documentation

## 2019-02-05 DIAGNOSIS — Z7982 Long term (current) use of aspirin: Secondary | ICD-10-CM | POA: Diagnosis not present

## 2019-02-05 DIAGNOSIS — T82198A Other mechanical complication of other cardiac electronic device, initial encounter: Secondary | ICD-10-CM

## 2019-02-05 DIAGNOSIS — T82118A Breakdown (mechanical) of other cardiac electronic device, initial encounter: Secondary | ICD-10-CM | POA: Diagnosis not present

## 2019-02-05 LAB — CBC WITH DIFFERENTIAL/PLATELET
Abs Immature Granulocytes: 0.05 10*3/uL (ref 0.00–0.07)
Basophils Absolute: 0 10*3/uL (ref 0.0–0.1)
Basophils Relative: 0 %
Eosinophils Absolute: 0 10*3/uL (ref 0.0–0.5)
Eosinophils Relative: 0 %
HCT: 42.1 % (ref 39.0–52.0)
Hemoglobin: 13.3 g/dL (ref 13.0–17.0)
Immature Granulocytes: 0 %
Lymphocytes Relative: 3 %
Lymphs Abs: 0.4 10*3/uL — ABNORMAL LOW (ref 0.7–4.0)
MCH: 27.9 pg (ref 26.0–34.0)
MCHC: 31.6 g/dL (ref 30.0–36.0)
MCV: 88.4 fL (ref 80.0–100.0)
Monocytes Absolute: 0 10*3/uL — ABNORMAL LOW (ref 0.1–1.0)
Monocytes Relative: 0 %
Neutro Abs: 10.8 10*3/uL — ABNORMAL HIGH (ref 1.7–7.7)
Neutrophils Relative %: 97 %
Platelets: 281 10*3/uL (ref 150–400)
RBC: 4.76 MIL/uL (ref 4.22–5.81)
RDW: 15 % (ref 11.5–15.5)
WBC: 11.3 10*3/uL — ABNORMAL HIGH (ref 4.0–10.5)
nRBC: 0 % (ref 0.0–0.2)

## 2019-02-05 LAB — COMPREHENSIVE METABOLIC PANEL
ALT: 10 U/L (ref 0–44)
AST: 17 U/L (ref 15–41)
Albumin: 3.6 g/dL (ref 3.5–5.0)
Alkaline Phosphatase: 47 U/L (ref 38–126)
Anion gap: 6 (ref 5–15)
BUN: 8 mg/dL (ref 6–20)
CO2: 28 mmol/L (ref 22–32)
Calcium: 8.9 mg/dL (ref 8.9–10.3)
Chloride: 103 mmol/L (ref 98–111)
Creatinine, Ser: 0.95 mg/dL (ref 0.61–1.24)
GFR calc Af Amer: >60 mL/min (ref >60)
GFR calc non Af Amer: >60 mL/min (ref >60)
Glucose, Bld: 139 mg/dL — ABNORMAL HIGH (ref 70–99)
Potassium: 4.2 mmol/L (ref 3.5–5.1)
Sodium: 137 mmol/L (ref 135–145)
Total Bilirubin: 0.6 mg/dL (ref 0.3–1.2)
Total Protein: 6.9 g/dL (ref 6.5–8.1)

## 2019-02-05 LAB — MAGNESIUM: Magnesium: 1.8 mg/dL (ref 1.7–2.4)

## 2019-02-05 LAB — TROPONIN I (HIGH SENSITIVITY)
Troponin I (High Sensitivity): 45 ng/L — ABNORMAL HIGH (ref <18)
Troponin I (High Sensitivity): 40 ng/L — ABNORMAL HIGH (ref <18)

## 2019-02-05 MED ORDER — SODIUM CHLORIDE 0.9% FLUSH: 3.0000 mL | Freq: Once | INTRAVENOUS | Status: DC

## 2019-02-05 NOTE — ED Notes (Signed)
[facility] for bloodwork from Dr. Charna Archer. Pt's pacemaker interrogated by this RN.

## 2019-02-05 NOTE — ED Provider Notes (Signed)
[facility] Emergency Department Provider Note   ____________________________________________   First MD Initiated Contact with Patient 02/05/19 1952     (approximate)  I have reviewed the triage vital signs and the nursing notes.   HISTORY  Chief Complaint Pacemaker Problem    HPI [name] is a 59 y.o. male with possible history of COPD on 3 L, cardiomyopathy status post defibrillator, and multiple myeloma who presents to the ED complaining of defibrillator discharge.  Patient reports that he has been shocked by his defibrillator approximately 8 times in the past 24 hours.  He has otherwise felt well and denies any chest pain between episodes, has not had any recent shortness of breath.  He denies any recent fevers, cough, vomiting, or sick contacts.  He reports being compliant with all of his medications and has not had issues with defibrillator discharge within the past couple of years.  He follows with Dr. Chancy Milroy of cardiology locally and had a defibrillator placed at [facility] in 2013.        Past Medical History:  Diagnosis Date  . Anxiety   . Arthritis   . Atrial septal defect   . CHF (congestive heart failure) ([place])   . COPD (chronic obstructive pulmonary disease) (HCC)    on 3L O2 - continuous  . Coronary artery disease   . CVA (cerebral vascular accident) ([place]) 02/2018   "mild" - no deficits  . Depression   . Dyspnea    with exertion  . Dysrhythmia    nonsustained VT, Afib  . Dysrhythmias   . Environmental and seasonal allergies   . GERD (gastroesophageal reflux disease)   . Hyperlipidemia   . ICD (implantable cardioverter-defibrillator) in place 08/22/2011   [facility]  . Malnourished ([place])   . Multiple myeloma (HCC)    multiple myeloma  . Myocardial infarction ([place])   . Neuropathy    feet, legs, hands  . Presence of permanent cardiac pacemaker    defib pacemaker  . Substance abuse [facility])     Patient  Active Problem List   Diagnosis Date Noted  . Palliative care encounter   . Altered mental status, unspecified 03/08/2018  . CVA (cerebral vascular accident) ([place]) 03/08/2018  . Gastroenteritis 02/15/2018  . Fatigue due to treatment 09/07/2016  . Palliative care by specialist   . Advance care planning   . Other insomnia   . Protein-calorie malnutrition, severe 05/25/2016  . Aspiration pneumonia ([place]) 05/23/2016  . Cough in adult 05/05/2016  . Multiple myeloma in relapse ([place]) 09/09/2015  . Congestive heart failure ([place]) 01/06/2015  . Cardiomyopathy ([place]) 01/06/2015  . Nonsustained ventricular tachycardia ([place]) 01/06/2015  . Malnutrition of moderate degree 12/24/2014  . Pressure ulcer 12/24/2014  . Cellulitis of second finger of left hand   . Sepsis ([place]) 12/23/2014  . 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 CAD (coronary artery disease) 08/22/2011  . Cardiac conduction disorder 08/22/2011  . Myocardial infarction ([place]) 08/22/2011  . Cardiac arrhythmia 08/22/2011  . Anxiety 08/21/2011  . GERD (gastroesophageal reflux disease) 08/21/2011  . Chronic obstructive pulmonary disease ([place]) 07/26/2011  . Nicotine addiction 07/26/2011    Past Surgical History:  Procedure Laterality Date  . CARDIAC DEFIBRILLATOR PLACEMENT  08/22/2011   [employer] single chamber placed by Dr Marchia Meiers, [place]  . CATARACT EXTRACTION W/PHACO Right 12/13/2018   Procedure: CATARACT EXTRACTION PHACO AND INTRAOCULAR LENS PLACEMENT ([place]) RIGHT;  Surgeon: Birder Robson, MD;  Location: [facility] ORS;  Service: Ophthalmology;  Laterality: Right;  [place] 01:21.4 CDE 15.56 FLUID PACK LOT # [id number]  . CATARACT EXTRACTION W/PHACO Left 01/23/2019   Procedure: CATARACT EXTRACTION PHACO AND INTRAOCULAR LENS PLACEMENT ([place]) LEFT;  Surgeon: Birder Robson, MD;  Location: [facility] ORS;  Service: Ophthalmology;  Laterality: Left;  [place] 01:13.5 CDE 13.33 Fluid Pack lot # [id number]  . TOOTH  EXTRACTION      Prior to Admission medications   Medication Sig Start Date End Date Taking? Authorizing Provider  acetaminophen (TYLENOL) 325 MG tablet Take 2 tablets (650 mg total) by mouth every 6 (six) hours as needed for mild pain (or Fever >/= 101). Patient taking differently: Take 650 mg by mouth every 6 (six) hours as needed for mild pain (or Fever >/= 101).  05/28/16   Nicholes Mango, MD  aspirin EC 81 MG EC tablet Take 1 tablet (81 mg total) by mouth daily. 03/13/18   Hillary Bow, MD  Buprenorphine (BUTRANS) 15 MCG/HR PTWK Place 1 patch onto the skin once a week.    [provider]  dexamethasone (DECADRON) 4 MG tablet TAKE THREE TABLETS BY MOUTH ONCE A WEEK WITH BREAKFAST Patient taking differently: Take 12 mg by mouth every Wednesday.  10/17/18   Cammie Sickle, MD  DULoxetine (CYMBALTA) 30 MG capsule Take 1 capsule (30 mg total) by mouth daily. 03/29/18   Cammie Sickle, MD  DUREZOL 0.05 % EMUL Place 1 drop into the right eye daily. 01/03/19   [provider]  Fluticasone-Salmeterol (ADVAIR DISKUS) 250-50 MCG/DOSE AEPB Inhale 1 puff into the lungs 2 (two) times daily. 05/22/18 05/22/19  Flora Lipps, MD  gabapentin (NEURONTIN) 300 MG capsule TAKE ONE CAPSULE BY MOUTH THREE TIMES DAILY 01/20/19   Cammie Sickle, MD  ipratropium-albuterol (DUONEB) 0.5-2.5 (3) MG/3ML SOLN Take 3 mLs by nebulization every 4 (four) hours as needed. Patient taking differently: Take 3 mLs by nebulization every 4 (four) hours as needed. Asthma 05/22/18   Flora Lipps, MD  ixazomib citrate (NINLARO) 3 MG capsule Take 1 capsule (3 mg total) by mouth every Wednesday. Take for 3 weeks on, then 1 week off. Take on an empty stomach 1hr before or 2hrs after food 01/15/19   Cammie Sickle, MD  metoprolol tartrate (LOPRESSOR) 50 MG tablet Take 50 mg by mouth daily.  10/02/18   [provider]  naloxone [facility]) nasal spray 4 mg/0.1 mL For Opioid Overdose: Call 911 and  administer a single spray of Narcan in one nostril. Repeat every 27mns as needed if no or minimal response Patient not taking: Reported on 01/23/2019 02/20/18   BCammie Sickle MD  ondansetron (ZOFRAN) 8 MG tablet TAKE ONE TABLET BY MOUTH EVERY 8 HOURS AS NEEDED Patient taking differently: Take 8 mg by mouth every 8 (eight) hours as needed for nausea or vomiting.  08/13/18   BCammie Sickle MD  OXYGEN Inhale 3 L into the lungs continuous.    [provider]  PROAIR HFA 108 (90 Base) MCG/ACT inhaler INHALE 2 PUFFS BY MOUTH INTO THE LUNGS EVERY 6 HOURS AS NEEDED FOR WHEEZING OR SHORTNESS OF BREATH Patient taking differently: Inhale 2 puffs into the lungs every 6 (six) hours as needed for wheezing or shortness of breath.  11/27/18   KFlora Lipps MD  tiotropium (SPIRIVA HANDIHALER) 18 MCG inhalation capsule Place 1 capsule (18 mcg total) into inhaler and inhale daily. 05/22/18 05/22/19  KFlora Lipps MD    Allergies Patient has no known allergies.  Family History  Problem Relation Age of Onset  . COPD Mother   . CAD Mother   . Heart attack Father   . Prostate cancer Maternal Grandfather   . Kidney cancer Neg Hx   . Bladder Cancer Neg Hx     Social History Social History   Tobacco Use  . Smoking status: Current Every Day Smoker    Packs/day: 0.50    Years: 40.00    Pack years: 20.00    Types: Cigarettes  . Smokeless tobacco: Never Used  Substance Use Topics  . Alcohol use: No    Alcohol/week: 0.0 standard drinks  . Drug use: No    Review of Systems  Constitutional: No fever/chills Eyes: No visual changes. ENT: No sore throat. Cardiovascular: Denies chest pain.  Positive for defibrillator discharge. Respiratory: Denies shortness of breath. Gastrointestinal: No abdominal pain.  No nausea, no vomiting.  No diarrhea.  No constipation. Genitourinary: Negative for dysuria. Musculoskeletal: Negative for back pain. Skin: Negative for rash. Neurological:  Negative for headaches, focal weakness or numbness.  ____________________________________________   PHYSICAL EXAM:  VITAL SIGNS: ED Triage Vitals  Enc Vitals Group     BP 02/05/19 1656 96/65     Pulse Rate 02/05/19 1656 (!) 55     Resp 02/05/19 1656 18     Temp 02/05/19 1656 98.2 F (36.8 C)     Temp Source 02/05/19 1656 Oral     SpO2 02/05/19 1656 100 %     Weight 02/05/19 1657 114 lb (51.7 kg)     Height 02/05/19 1657 6' (1.829 m)     Head Circumference --      Peak Flow --      Pain Score 02/05/19 1657 0     Pain Loc --      Pain Edu? --      Excl. in [place]? --     Constitutional: Alert and oriented. Eyes: Conjunctivae are normal. Head: Atraumatic. Nose: No congestion/rhinnorhea. Mouth/Throat: Mucous membranes are moist. Neck: Normal ROM Cardiovascular: Normal rate, regular rhythm. Grossly normal heart sounds. Respiratory: Normal respiratory effort.  No retractions. Lungs CTAB. Gastrointestinal: Soft and nontender. No distention. Genitourinary: deferred Musculoskeletal: No lower extremity tenderness nor edema. Neurologic:  Normal speech and language. No gross focal neurologic deficits are appreciated. Skin:  Skin is warm, dry and intact. No rash noted. Psychiatric: Mood and affect are normal. Speech and behavior are normal.  ____________________________________________   LABS (all labs ordered are listed, but only abnormal results are displayed)  Labs Reviewed  CBC WITH DIFFERENTIAL/PLATELET - Abnormal; Notable for the following components:      Result Value   WBC 11.3 (*)    Neutro Abs 10.8 (*)    Lymphs Abs 0.4 (*)    Monocytes Absolute 0.0 (*)    All other components within normal limits  COMPREHENSIVE METABOLIC PANEL - Abnormal; Notable for the following components:   Glucose, Bld 139 (*)    All other components within normal limits  TROPONIN I (HIGH SENSITIVITY) - Abnormal; Notable for the following components:   Troponin I (High Sensitivity) 45 (*)     All other components within normal limits  TROPONIN I (HIGH SENSITIVITY) - Abnormal; Notable for the following components:   Troponin I (High Sensitivity) 40 (*)    All other components within normal limits  MAGNESIUM   ____________________________________________  EKG  ED ECG REPORT I, Blake Divine, the attending physician, personally viewed and interpreted this ECG.   Date: 02/05/2019  EKG Time: 19:59  Rate: 102  Rhythm: Ventricular bigeminy  Axis: Normal  Intervals:none  ST&T Change: None   PROCEDURES  Procedure(s) performed (including Critical Care):  .Critical Care Performed by: Blake Divine, MD Authorized by: Blake Divine, MD   Critical care provider statement:    Critical care time (minutes):  35   Critical care time was exclusive of:  Separately billable procedures and treating other patients and teaching time   Critical care was necessary to treat or prevent imminent or life-threatening deterioration of the following conditions:  Cardiac failure   Critical care was time spent personally by me on the following activities:  Discussions with consultants, evaluation of patient's response to treatment, examination of patient, ordering and performing treatments and interventions, ordering and review of laboratory studies, ordering and review of radiographic studies, pulse oximetry, re-evaluation of patient's condition, obtaining history from patient or surrogate and review of old charts   I assumed direction of critical care for this patient from another provider in my specialty: no       ____________________________________________   INITIAL IMPRESSION / ASSESSMENT AND PLAN / ED COURSE       59 year old male with history of cardiomyopathy status post single-chamber defibrillator placement in 2013 presents to the ED for 8 discharges of his defibrillator over the past 24 hours.  Interrogation was performed and reportedly shows inappropriate shocks with no  evidence of V. tach or other arrhythmia.  This appears secondary to broken lead, for which magnet was initially placed and [employer] rep has subsequently turned off defibrillator.  He is in bigeminy here in the ED with no ischemic changes, lab work is reassuring with mildly elevated troponin but no symptoms to suggest ischemia and this is stable on recheck.  Case was discussed with patient's cardiologist, Dr. Chancy Milroy, who states that patient may not require replacement of his defibrillator given EF is improved on follow-up echocardiogram from February of last year.  He does recommend that we discuss the case with [facility] cardiology given that he is unable to replace defibrillator and patient's original device was placed at [facility].  Also, while he has improved EF, he is at slightly higher risk given bigeminy seen here in the ED.  Case discussed with Dr. Launa Grill of cardiology at [facility], who states that patient would be appropriate for outpatient follow-up with cardiology to determine need for defibrillator placement, given that he understands there is some risk of arrhythmia in the meantime.  Dr. Launa Grill is willing to accept the patient in transfer if patient is unwilling to accept this small risk.  I discussed this with the patient, who understands the risk of life-threatening arrhythmia and at this time he prefers to be discharged home with outpatient follow-up.  I have counseled him to return to the ED for new or worsening symptoms, patient agrees with plan.      ____________________________________________   FINAL CLINICAL IMPRESSION(S) / ED DIAGNOSES  Final diagnoses:  Malfunction of implantable cardioverter-defibrillator (ICD), initial encounter  Inappropriate discharge of implantable cardioverter-defibrillator (ICD), initial encounter  Ventricular bigeminy     ED Discharge Orders    None       Note:  This document was prepared using Dragon voice recognition software and may include  unintentional dictation errors.   Blake Divine, MD 02/06/19 [phone]

## 2019-02-05 NOTE — ED Notes (Signed)
Magnet placed per Tiffany RN request

## 2019-02-05 NOTE — ED Notes (Signed)
Per Jory Sims pacemaker rep, pt with fractured lead and will need to have magnet placed on top and the rep will come and turn it off.

## 2019-02-05 NOTE — ED Triage Notes (Signed)
Pt presents to ED via POV with c/o defibrillator firing approx 8 times in the last 24 hrs. Pt presents alert and oriented at this time. Pt states Comptroller at this time.

## 2019-02-10 ENCOUNTER — Other Ambulatory Visit: Payer: Self-pay

## 2019-02-10 NOTE — Progress Notes (Signed)
Patient pre screened for office appointment, no questions or concerns today. Patient reminded of upcoming appointment time and date. 

## 2019-02-11 ENCOUNTER — Encounter: Payer: Self-pay | Admitting: Internal Medicine

## 2019-02-11 ENCOUNTER — Inpatient Hospital Stay: Payer: Medicaid Other | Attending: Internal Medicine | Admitting: Internal Medicine

## 2019-02-11 ENCOUNTER — Other Ambulatory Visit: Payer: Self-pay

## 2019-02-11 ENCOUNTER — Inpatient Hospital Stay: Payer: Medicaid Other

## 2019-02-11 VITALS — BP 103/60 | HR 46 | Temp 96.1°F | Wt 114.0 lb

## 2019-02-11 DIAGNOSIS — Z8249 Family history of ischemic heart disease and other diseases of the circulatory system: Secondary | ICD-10-CM | POA: Insufficient documentation

## 2019-02-11 DIAGNOSIS — C9002 Multiple myeloma in relapse: Secondary | ICD-10-CM | POA: Diagnosis not present

## 2019-02-11 DIAGNOSIS — I252 Old myocardial infarction: Secondary | ICD-10-CM | POA: Diagnosis not present

## 2019-02-11 DIAGNOSIS — G893 Neoplasm related pain (acute) (chronic): Secondary | ICD-10-CM | POA: Diagnosis not present

## 2019-02-11 DIAGNOSIS — Z8673 Personal history of transient ischemic attack (TIA), and cerebral infarction without residual deficits: Secondary | ICD-10-CM | POA: Diagnosis not present

## 2019-02-11 DIAGNOSIS — F17210 Nicotine dependence, cigarettes, uncomplicated: Secondary | ICD-10-CM | POA: Diagnosis not present

## 2019-02-11 DIAGNOSIS — R918 Other nonspecific abnormal finding of lung field: Secondary | ICD-10-CM | POA: Diagnosis not present

## 2019-02-11 DIAGNOSIS — Z7982 Long term (current) use of aspirin: Secondary | ICD-10-CM | POA: Insufficient documentation

## 2019-02-11 DIAGNOSIS — Z7952 Long term (current) use of systemic steroids: Secondary | ICD-10-CM | POA: Diagnosis not present

## 2019-02-11 DIAGNOSIS — J449 Chronic obstructive pulmonary disease, unspecified: Secondary | ICD-10-CM | POA: Insufficient documentation

## 2019-02-11 DIAGNOSIS — I4891 Unspecified atrial fibrillation: Secondary | ICD-10-CM | POA: Diagnosis not present

## 2019-02-11 DIAGNOSIS — Z7951 Long term (current) use of inhaled steroids: Secondary | ICD-10-CM | POA: Insufficient documentation

## 2019-02-11 DIAGNOSIS — F329 Major depressive disorder, single episode, unspecified: Secondary | ICD-10-CM | POA: Diagnosis not present

## 2019-02-11 DIAGNOSIS — E785 Hyperlipidemia, unspecified: Secondary | ICD-10-CM | POA: Diagnosis not present

## 2019-02-11 DIAGNOSIS — Z79899 Other long term (current) drug therapy: Secondary | ICD-10-CM | POA: Diagnosis not present

## 2019-02-11 LAB — CBC WITH DIFFERENTIAL/PLATELET
Abs Immature Granulocytes: 0.03 10*3/uL (ref 0.00–0.07)
Basophils Absolute: 0 10*3/uL (ref 0.0–0.1)
Basophils Relative: 0 %
Eosinophils Absolute: 0.1 10*3/uL (ref 0.0–0.5)
Eosinophils Relative: 1 %
HCT: 41.8 % (ref 39.0–52.0)
Hemoglobin: 13.1 g/dL (ref 13.0–17.0)
Immature Granulocytes: 0 %
Lymphocytes Relative: 11 %
Lymphs Abs: 1 10*3/uL (ref 0.7–4.0)
MCH: 28.7 pg (ref 26.0–34.0)
MCHC: 31.3 g/dL (ref 30.0–36.0)
MCV: 91.5 fL (ref 80.0–100.0)
Monocytes Absolute: 1 10*3/uL (ref 0.1–1.0)
Monocytes Relative: 11 %
Neutro Abs: 6.9 10*3/uL (ref 1.7–7.7)
Neutrophils Relative %: 77 %
Platelets: 225 10*3/uL (ref 150–400)
RBC: 4.57 MIL/uL (ref 4.22–5.81)
RDW: 15.3 % (ref 11.5–15.5)
WBC: 9.1 10*3/uL (ref 4.0–10.5)
nRBC: 0 % (ref 0.0–0.2)

## 2019-02-11 LAB — COMPREHENSIVE METABOLIC PANEL
ALT: 9 U/L (ref 0–44)
AST: 16 U/L (ref 15–41)
Albumin: 3.8 g/dL (ref 3.5–5.0)
Alkaline Phosphatase: 51 U/L (ref 38–126)
Anion gap: 8 (ref 5–15)
BUN: 9 mg/dL (ref 6–20)
CO2: 29 mmol/L (ref 22–32)
Calcium: 9.2 mg/dL (ref 8.9–10.3)
Chloride: 100 mmol/L (ref 98–111)
Creatinine, Ser: 0.99 mg/dL (ref 0.61–1.24)
GFR calc Af Amer: >60 mL/min (ref >60)
GFR calc non Af Amer: >60 mL/min (ref >60)
Glucose, Bld: 86 mg/dL (ref 70–99)
Potassium: 3.8 mmol/L (ref 3.5–5.1)
Sodium: 137 mmol/L (ref 135–145)
Total Bilirubin: 0.5 mg/dL (ref 0.3–1.2)
Total Protein: 7.1 g/dL (ref 6.5–8.1)

## 2019-02-11 MED ORDER — GABAPENTIN 400 MG PO CAPS: 400.0000 mg | ORAL_CAPSULE | Freq: Three times a day (TID) | ORAL | 1 refills | Status: DC

## 2019-02-11 NOTE — Assessment & Plan Note (Addendum)
#  MULTIPLE MYELOMA-AUG 2019 recurrent;   Oct 2019- PET- no lytic lesions noted.  Stable.  # Currently on Ninlaro-Dex [start again 01/02/2018]- PR noted; November 2020 0.2 -Kappa light chains-slightly abnormal- 2.75. overall stable.  Continue Ninlaro-Dex weekly.    # Bilateral subcentimeter lung nodules -AUG 2020- CT overall stable; will order imaging prior to next visit.  # COPD- advair/spiriva/O2- STABLE.   # Chronic nausea-question secondary to Ninlaro.  Stable.  #Chronic pain-stable 15 mgcurrently on buprenorphine pain patch once a week; pain management];Marland Kitchen  No breakthrough pain medication.  # Neuropathy- worsened.  Will increase the dose to 400 mg 3 times daily; new prescription refilled.   # DISPOSITION: # follow up in 1 month- cbc/cmp/MMpanel/K-L light chain; CT chest prior- Dr.B

## 2019-02-11 NOTE — Progress Notes (Signed)
[place] OFFICE PROGRESS NOTE  Patient Care Team: Patient, No Pcp Per as PCP - General (General Practice)  Cancer Staging No matching staging information was found for the patient.   Oncology History Overview Note  # SEP 2015- MULTIPLE MYELOMA [multiple PET pos Bone lesions; hypercalcemia s/p BMBx; FISH- Aneuploidy - gain of chromosome 7,9,15,FGFR3/4p16.3, and CCND1/11q13. Loss of MAF/16q23.1), cytogenetics normal 46XY] s/p Vel-Dex-Rev-Zometa; Excellent PR;   # MARCH 2016- REV-DEX ; FEB 2017-[discn Dex] Rev 25 mg 3 w On & 1 w Off; March 2017- M- protein 0.4gm/dl; K/L= 7.9; cont Rev;   # Revlimid HELD June 2018 [sec to [facility]]  # AUG 2019- RECURRENCE- NINLARO-DEX  # March 2017-  Left Middle Lobe cavitary lesion- ~41m- repeat Ct in 3-429m# AUG 2017- [facility] [[name], [place] #  ? [facility]- [id number] not follow up ]- DISCONT Zometa.; NO HBO sec to   # Chronic pain/Anxiety [ortho]  # COPD/smoking/ [[facility]- not candidate for BMT- sec to co-morbidities/poor nutritional status];    # MARCH 2020-PAIN CONTRACT: BREACH-  DECLINE ANY NARCOTICS/BENZO  # PALLIATIVE CARE: [name] [2020]  ---------------------------------------------------------------  DIAGNOSIS: MULTIPLE MYELOMA  STAGE: RECURRENT  ;GOALS: CONTROL  CURRENT/MOST RECENT THERAPY; AUG 2019- NINLARO-DEX    Multiple [place] in relapse ([name]     INTERVAL HISTORY:  [name] [age].o.  male pleasant patient above history of recurrent/relapse multiple myeloma-currently on Ninlaro-dex is here for follow-up.  Patient states to have recent malfunction of his defibrillator/pacemaker.  He is [facility] is [facility].  He continues to be on the buprenorphine pain patch.  However noted to have worsening tingling and numbness in his extremities especially mornings.  No falls.   Review of Systems  Constitutional: Positive for malaise/fatigue.  Negative for chills, diaphoresis and fever.  HENT: Negative for nosebleeds and sore throat.   Eyes: Negative for double vision.  Respiratory: Positive for cough and shortness of breath. Negative for hemoptysis and sputum production.   Cardiovascular: Negative for chest pain, palpitations, orthopnea and leg swelling.  Gastrointestinal: Negative for abdominal pain, blood in stool, constipation, diarrhea, heartburn, melena and vomiting.  Genitourinary: Negative for dysuria, frequency and urgency.  Musculoskeletal: Positive for back pain and joint pain.  Skin: Negative.  Negative for itching and rash.  Neurological: Positive for tingling. Negative for dizziness, focal weakness, weakness and headaches.  Endo/Heme/Allergies: Does not bruise/bleed easily.  Psychiatric/Behavioral: Negative for depression. The patient is not nervous/anxious and does not have insomnia.       PAST MEDICAL HISTORY :  Past Medical History:  Diagnosis Date  . Anxiety   . Arthritis   . Atrial septal defect   . CHF (congestive heart failure) ([facility]  . COPD (chronic obstructive pulmonary disease) (HCC)    on 3L O2 - continuous  . Coronary artery disease   . CVA (cerebral vascular accident) ([facility]CT[place]02/2020   "mild" - no deficits  . Depression   . Dyspnea    with exertion  . Dysrhythmia    nonsustained VT, Afib  . Dysrhythmias   . Environmental and seasonal allergies   . GERD (gastroesophageal reflux disease)   . Hyperlipidemia   . ICD (implantable cardioverter-defibrillator) [facility] ([facility]  . Multiple myeloma (HCC)    multiple myeloma  . Myocardial infarction ([place]  . Neuropathy    feet, legs, hands  .  Presence of permanent cardiac pacemaker    defib pacemaker  . Substance abuse ([place])     PAST SURGICAL HISTORY :   Past Surgical History:  Procedure Laterality Date  . CARDIAC DEFIBRILLATOR PLACEMENT  08/22/2011   [employer] single  chamber placed by Dr Marchia Meiers, [place]  . CATARACT EXTRACTION W/PHACO Right 12/13/2018   Procedure: CATARACT EXTRACTION PHACO AND INTRAOCULAR LENS PLACEMENT ([place]) RIGHT;  Surgeon: Birder Robson, MD;  Location: [facility] ORS;  Service: Ophthalmology;  Laterality: Right;  [place] 01:21.4 CDE 15.56 FLUID PACK LOT # [id number]  . CATARACT EXTRACTION W/PHACO Left 01/23/2019   Procedure: CATARACT EXTRACTION PHACO AND INTRAOCULAR LENS PLACEMENT ([place]) LEFT;  Surgeon: Birder Robson, MD;  Location: [facility] ORS;  Service: Ophthalmology;  Laterality: Left;  [place] 01:13.5 CDE 13.33 Fluid Pack lot # [id number]  . TOOTH EXTRACTION      FAMILY HISTORY :   Family History  Problem Relation Age of Onset  . COPD Mother   . CAD Mother   . Heart attack Father   . Prostate cancer Maternal Grandfather   . Kidney cancer Neg Hx   . Bladder Cancer Neg Hx     SOCIAL HISTORY:   Social History   Tobacco Use  . Smoking status: Current Every Day Smoker    Packs/day: 0.50    Years: 40.00    Pack years: 20.00    Types: Cigarettes  . Smokeless tobacco: Never Used  Substance Use Topics  . Alcohol use: No    Alcohol/week: 0.0 standard drinks  . Drug use: No    ALLERGIES:  has No Known Allergies.  MEDICATIONS:  Current Outpatient Medications  Medication Sig Dispense Refill  . acetaminophen (TYLENOL) 325 MG tablet Take 2 tablets (650 mg total) by mouth every 6 (six) hours as needed for mild pain (or Fever >/= 101). (Patient taking differently: Take 650 mg by mouth every 6 (six) hours as needed for mild pain (or Fever >/= 101). )    . aspirin EC 81 MG EC tablet Take 1 tablet (81 mg total) by mouth daily.    . Buprenorphine (BUTRANS) 15 MCG/HR PTWK Place 1 patch onto the skin once a week.    Marland Kitchen dexamethasone (DECADRON) 4 MG tablet TAKE THREE TABLETS BY MOUTH ONCE A WEEK WITH BREAKFAST (Patient taking differently: Take 12 mg by mouth every Wednesday. ) 30 tablet 4  . DULoxetine (CYMBALTA) 30 MG capsule Take 1 capsule  (30 mg total) by mouth daily. 90 capsule 3  . DUREZOL 0.05 % EMUL Place 1 drop into the right eye daily.    . Fluticasone-Salmeterol (ADVAIR DISKUS) 250-50 MCG/DOSE AEPB Inhale 1 puff into the lungs 2 (two) times daily. 3 each 2  . ipratropium-albuterol (DUONEB) 0.5-2.5 (3) MG/3ML SOLN Take 3 mLs by nebulization every 4 (four) hours as needed. (Patient taking differently: Take 3 mLs by nebulization every 4 (four) hours as needed. Asthma) 360 mL 3  . ixazomib citrate (NINLARO) 3 MG capsule Take 1 capsule (3 mg total) by mouth every Wednesday. Take for 3 weeks on, then 1 week off. Take on an empty stomach 1hr before or 2hrs after food 3 capsule 4  . metoprolol tartrate (LOPRESSOR) 50 MG tablet Take 50 mg by mouth daily.     . naloxone (NARCAN) nasal spray 4 mg/0.1 mL For Opioid Overdose: Call 911 and administer a single spray of Narcan in one nostril. Repeat every 28mns as needed if no or minimal response 1 kit 2  . ondansetron (  ZOFRAN) 8 MG tablet TAKE ONE TABLET BY MOUTH EVERY 8 HOURS AS NEEDED (Patient taking differently: Take 8 mg by mouth every 8 (eight) hours as needed for nausea or vomiting. ) 60 tablet 3  . OXYGEN Inhale 3 L into the lungs continuous.    Marland Kitchen PROAIR HFA 108 (90 Base) MCG/ACT inhaler INHALE 2 PUFFS BY MOUTH INTO THE LUNGS EVERY 6 HOURS AS NEEDED FOR WHEEZING OR SHORTNESS OF BREATH (Patient taking differently: Inhale 2 puffs into the lungs every 6 (six) hours as needed for wheezing or shortness of breath. ) 8.5 g 6  . tiotropium (SPIRIVA HANDIHALER) 18 MCG inhalation capsule Place 1 capsule (18 mcg total) into inhaler and inhale daily. 30 capsule 6  . gabapentin (NEURONTIN) 400 MG capsule Take 1 capsule (400 mg total) by mouth 3 (three) times daily. 90 capsule 1   No current facility-administered medications for this visit.    PHYSICAL EXAMINATION: ECOG PERFORMANCE STATUS: 2 - Symptomatic, <50% confined to bed  BP 103/60 (BP Location: Left Arm, Patient Position: Sitting, Cuff  Size: Normal)   Pulse (!) 46   Temp (!) 96.1 F (35.6 C) (Tympanic)   Wt 114 lb (51.7 kg)   SpO2 98% Comment: 3 liters of o2  BMI 15.46 kg/m   Filed Weights   02/11/19 1319  Weight: 114 lb (51.7 kg)    Physical Exam  Constitutional: He is oriented to person, place, and time.  Cachectic appearing Caucasian male patient; 2 L nasal cannula.  He is alone.  He is walking by himself.  HENT:  Head: Normocephalic and atraumatic.  Mouth/Throat: Oropharynx is clear and moist. No oropharyngeal exudate.  Eyes: Pupils are equal, round, and reactive to light.  Cardiovascular: Normal rate and regular rhythm.  Pulmonary/Chest: No respiratory distress. He has wheezes.  Bilateral decreased air entry.  Abdominal: Soft. Bowel sounds are normal. He exhibits no distension and no mass. There is no abdominal tenderness. There is no rebound and no guarding.  Musculoskeletal:        General: No tenderness or edema. Normal range of motion.     Cervical back: Normal range of motion and neck supple.  Neurological: He is alert and oriented to person, place, and time.  Skin: Skin is warm.  Psychiatric: Affect normal.       LABORATORY DATA:  I have reviewed the data as listed    Component Value Date/Time   NA 137 02/11/2019 1246   NA 135 05/12/2014 1343   K 3.8 02/11/2019 1246   K 3.3 (L) 05/12/2014 1343   CL 100 02/11/2019 1246   CL 101 05/12/2014 1343   CO2 29 02/11/2019 1246   CO2 27 05/12/2014 1343   GLUCOSE 86 02/11/2019 1246   GLUCOSE 77 05/12/2014 1343   BUN 9 02/11/2019 1246   BUN 9 05/12/2014 1343   CREATININE 0.99 02/11/2019 1246   CREATININE 0.89 05/12/2014 1343   CALCIUM 9.2 02/11/2019 1246   CALCIUM 8.8 (L) 05/12/2014 1343   PROT 7.1 02/11/2019 1246   PROT 5.9 (L) 02/17/2014 1405   ALBUMIN 3.8 02/11/2019 1246   ALBUMIN 2.8 (L) 02/17/2014 1405   AST 16 02/11/2019 1246   AST 9 (L) 02/17/2014 1405   ALT 9 02/11/2019 1246   ALT 12 (L) 02/17/2014 1405   ALKPHOS 51 02/11/2019  1246   ALKPHOS 44 (L) 02/17/2014 1405   BILITOT 0.5 02/11/2019 1246   BILITOT 0.2 02/17/2014 1405   GFRNONAA >60 02/11/2019 1246   GFRNONAA >60  05/12/2014 1343   GFRAA >60 02/11/2019 1246   GFRAA >60 05/12/2014 1343    No results found for: SPEP, UPEP  Lab Results  Component Value Date   WBC 9.1 02/11/2019   NEUTROABS 6.9 02/11/2019   HGB 13.1 02/11/2019   HCT 41.8 02/11/2019   MCV 91.5 02/11/2019   PLT 225 02/11/2019      Chemistry      Component Value Date/Time   NA 137 02/11/2019 1246   NA 135 05/12/2014 1343   K 3.8 02/11/2019 1246   K 3.3 (L) 05/12/2014 1343   CL 100 02/11/2019 1246   CL 101 05/12/2014 1343   CO2 29 02/11/2019 1246   CO2 27 05/12/2014 1343   BUN 9 02/11/2019 1246   BUN 9 05/12/2014 1343   CREATININE 0.99 02/11/2019 1246   CREATININE 0.89 05/12/2014 1343      Component Value Date/Time   CALCIUM 9.2 02/11/2019 1246   CALCIUM 8.8 (L) 05/12/2014 1343   ALKPHOS 51 02/11/2019 1246   ALKPHOS 44 (L) 02/17/2014 1405   AST 16 02/11/2019 1246   AST 9 (L) 02/17/2014 1405   ALT 9 02/11/2019 1246   ALT 12 (L) 02/17/2014 1405   BILITOT 0.5 02/11/2019 1246   BILITOT 0.2 02/17/2014 1405       RADIOGRAPHIC STUDIES: I have personally reviewed the radiological images as listed and agreed with the findings in the report. No results found.   ASSESSMENT & PLAN:  Multiple myeloma in relapse ([place]) # MULTIPLE MYELOMA-AUG 2019 recurrent;   Oct 2019- PET- no lytic lesions noted.  Stable.  # Currently on Ninlaro-Dex [start again 01/02/2018]- PR noted; November 2020 0.2 -Kappa light chains-slightly abnormal- 2.75. overall stable.  Continue Ninlaro-Dex weekly.    # Bilateral subcentimeter lung nodules -AUG 2020- CT overall stable; will order imaging prior to next visit.  # COPD- advair/spiriva/O2- STABLE.   # Chronic nausea-question secondary to Ninlaro.  Stable.  #Chronic pain-stable 15 mgcurrently on buprenorphine pain patch once a week; pain  management];Marland Kitchen  No breakthrough pain medication.  # Neuropathy- worsened.  Will increase the dose to 400 mg 3 times daily; new prescription refilled.   # DISPOSITION: # follow up in 1 month- cbc/cmp/MMpanel/K-L light chain; CT chest prior- Dr.B   Orders Placed This Encounter  Procedures  . CT CHEST WO CONTRAST    Standing Status:   Future    Standing Expiration Date:   02/11/2020    Order Specific Question:   Preferred imaging location?    Answer:   [facility]    Order Specific Question:   Radiology Contrast Protocol - do NOT remove file path    Answer:   [email or web address]    Order Specific Question:   ** REASON FOR EXAM (FREE TEXT)    Answer:   bialteral lung nodules  . CBC with Differential    Standing Status:   Future    Standing Expiration Date:   02/11/2020  . Comprehensive metabolic panel    Standing Status:   Future    Standing Expiration Date:   02/11/2020  . Kappa/lambda light chains    Standing Status:   Future    Standing Expiration Date:   02/11/2020   All questions were answered. The patient knows to call the clinic with any problems, questions or concerns.      Cammie Sickle, MD 02/11/2019 2:47 PM

## 2019-02-12 LAB — KAPPA/LAMBDA LIGHT CHAINS
Kappa free light chain: 15.8 mg/L (ref 3.3–19.4)
Kappa, lambda light chain ratio: 2.16 — ABNORMAL HIGH (ref 0.26–1.65)
Lambda free light chains: 7.3 mg/L (ref 5.7–26.3)

## 2019-02-13 LAB — MULTIPLE MYELOMA PANEL, SERUM
Albumin SerPl Elph-Mcnc: 3.6 g/dL (ref 2.9–4.4)
Albumin/Glob SerPl: 1.4 (ref 0.7–1.7)
Alpha 1: 0.3 g/dL (ref 0.0–0.4)
Alpha2 Glob SerPl Elph-Mcnc: 0.8 g/dL (ref 0.4–1.0)
B-Globulin SerPl Elph-Mcnc: 0.9 g/dL (ref 0.7–1.3)
Gamma Glob SerPl Elph-Mcnc: 0.7 g/dL (ref 0.4–1.8)
Globulin, Total: 2.6 g/dL (ref 2.2–3.9)
IgA: 66 mg/dL — ABNORMAL LOW (ref 90–386)
IgG (Immunoglobin G), Serum: 858 mg/dL (ref 603–1613)
IgM (Immunoglobulin M), Srm: 39 mg/dL (ref 20–172)
M Protein SerPl Elph-Mcnc: 0.3 g/dL — ABNORMAL HIGH
Total Protein ELP: 6.2 g/dL (ref 6.0–8.5)

## 2019-02-20 MED FILL — NINLARO 3 MG CAPS: 3 | 28 days supply | Qty: 3 | Fill #1

## 2019-03-10 ENCOUNTER — Ambulatory Visit: Payer: Medicaid Other | Attending: Internal Medicine

## 2019-03-11 ENCOUNTER — Telehealth: Payer: Self-pay | Admitting: *Deleted

## 2019-03-11 ENCOUNTER — Other Ambulatory Visit: Payer: Self-pay | Admitting: *Deleted

## 2019-03-11 ENCOUNTER — Telehealth: Payer: Self-pay | Admitting: Internal Medicine

## 2019-03-11 DIAGNOSIS — C9002 Multiple myeloma in relapse: Secondary | ICD-10-CM

## 2019-03-11 NOTE — Telephone Encounter (Signed)
Contacted patient for his prescreening call prior to his apt. Per patient, he would like cnl apts tom. On note:  patient no showed yesterday for ct scan. pt states he didn't remember his ct scan apts.  Pt is willing to r/s but needs to talk to his wife first. He asked that we contact his wife tom. With  New apts 8:30 am and 9am.  Reason for cnl. he needs "his pace maker replaced' pt advised that he needs an apt to be r/s in the clinic. he states that his wife has too many apts and we will have to r/s with her. I will have our scheduling team reach out to the patient.

## 2019-03-11 NOTE — Telephone Encounter (Signed)
LVMTCB x 1

## 2019-03-12 ENCOUNTER — Inpatient Hospital Stay: Payer: Medicaid Other | Admitting: Internal Medicine

## 2019-03-12 ENCOUNTER — Inpatient Hospital Stay: Payer: Medicaid Other

## 2019-03-12 NOTE — Telephone Encounter (Signed)
Called and let pt know that he would need to come in to be requalified before we could redo the rx for his O2. He verbalized his understanding. I scheduled him for 3/2 at 10am with TP. Nothing further needed.

## 2019-03-18 ENCOUNTER — Other Ambulatory Visit: Payer: Medicaid Other

## 2019-03-18 ENCOUNTER — Other Ambulatory Visit: Payer: Self-pay

## 2019-03-18 ENCOUNTER — Ambulatory Visit
Admission: RE | Admit: 2019-03-18 | Discharge: 2019-03-18 | Disposition: A | Discharge status: Home / Self Care | Payer: Medicaid Other | Source: Ambulatory Visit | Attending: Internal Medicine | Admitting: Internal Medicine

## 2019-03-18 DIAGNOSIS — R918 Other nonspecific abnormal finding of lung field: Secondary | ICD-10-CM | POA: Diagnosis not present

## 2019-03-19 ENCOUNTER — Other Ambulatory Visit: Payer: Self-pay

## 2019-03-19 ENCOUNTER — Inpatient Hospital Stay: Payer: Medicaid Other | Attending: Internal Medicine | Admitting: Internal Medicine

## 2019-03-19 ENCOUNTER — Encounter: Payer: Self-pay | Admitting: Internal Medicine

## 2019-03-19 ENCOUNTER — Inpatient Hospital Stay: Payer: Medicaid Other | Attending: Internal Medicine

## 2019-03-19 DIAGNOSIS — Z79899 Other long term (current) drug therapy: Secondary | ICD-10-CM | POA: Insufficient documentation

## 2019-03-19 DIAGNOSIS — F17210 Nicotine dependence, cigarettes, uncomplicated: Secondary | ICD-10-CM | POA: Diagnosis not present

## 2019-03-19 DIAGNOSIS — J449 Chronic obstructive pulmonary disease, unspecified: Secondary | ICD-10-CM | POA: Insufficient documentation

## 2019-03-19 DIAGNOSIS — C9002 Multiple myeloma in relapse: Secondary | ICD-10-CM

## 2019-03-19 DIAGNOSIS — G629 Polyneuropathy, unspecified: Secondary | ICD-10-CM | POA: Diagnosis not present

## 2019-03-19 DIAGNOSIS — Z7952 Long term (current) use of systemic steroids: Secondary | ICD-10-CM | POA: Insufficient documentation

## 2019-03-19 DIAGNOSIS — Z7951 Long term (current) use of inhaled steroids: Secondary | ICD-10-CM | POA: Insufficient documentation

## 2019-03-19 DIAGNOSIS — I4891 Unspecified atrial fibrillation: Secondary | ICD-10-CM | POA: Diagnosis not present

## 2019-03-19 DIAGNOSIS — F419 Anxiety disorder, unspecified: Secondary | ICD-10-CM | POA: Diagnosis not present

## 2019-03-19 DIAGNOSIS — Z8673 Personal history of transient ischemic attack (TIA), and cerebral infarction without residual deficits: Secondary | ICD-10-CM | POA: Diagnosis not present

## 2019-03-19 DIAGNOSIS — E785 Hyperlipidemia, unspecified: Secondary | ICD-10-CM | POA: Diagnosis not present

## 2019-03-19 DIAGNOSIS — R918 Other nonspecific abnormal finding of lung field: Secondary | ICD-10-CM | POA: Insufficient documentation

## 2019-03-19 DIAGNOSIS — Z8042 Family history of malignant neoplasm of prostate: Secondary | ICD-10-CM | POA: Diagnosis not present

## 2019-03-19 DIAGNOSIS — Z8249 Family history of ischemic heart disease and other diseases of the circulatory system: Secondary | ICD-10-CM | POA: Diagnosis not present

## 2019-03-19 DIAGNOSIS — F329 Major depressive disorder, single episode, unspecified: Secondary | ICD-10-CM | POA: Insufficient documentation

## 2019-03-19 DIAGNOSIS — I252 Old myocardial infarction: Secondary | ICD-10-CM | POA: Diagnosis not present

## 2019-03-19 DIAGNOSIS — Z7982 Long term (current) use of aspirin: Secondary | ICD-10-CM | POA: Insufficient documentation

## 2019-03-19 LAB — CBC WITH DIFFERENTIAL/PLATELET
Abs Immature Granulocytes: 0.04 10*3/uL (ref 0.00–0.07)
Basophils Absolute: 0 10*3/uL (ref 0.0–0.1)
Basophils Relative: 0 %
Eosinophils Absolute: 0 10*3/uL (ref 0.0–0.5)
Eosinophils Relative: 0 %
HCT: 39.8 % (ref 39.0–52.0)
Hemoglobin: 12.4 g/dL — ABNORMAL LOW (ref 13.0–17.0)
Immature Granulocytes: 1 %
Lymphocytes Relative: 7 %
Lymphs Abs: 0.6 10*3/uL — ABNORMAL LOW (ref 0.7–4.0)
MCH: 27.9 pg (ref 26.0–34.0)
MCHC: 31.2 g/dL (ref 30.0–36.0)
MCV: 89.4 fL (ref 80.0–100.0)
Monocytes Absolute: 0.3 10*3/uL (ref 0.1–1.0)
Monocytes Relative: 3 %
Neutro Abs: 7.6 10*3/uL (ref 1.7–7.7)
Neutrophils Relative %: 89 %
Platelets: 222 10*3/uL (ref 150–400)
RBC: 4.45 MIL/uL (ref 4.22–5.81)
RDW: 13.7 % (ref 11.5–15.5)
Smear Review: ADEQUATE
WBC: 8.5 10*3/uL (ref 4.0–10.5)
nRBC: 0 % (ref 0.0–0.2)

## 2019-03-19 LAB — COMPREHENSIVE METABOLIC PANEL
ALT: 9 U/L (ref 0–44)
AST: 17 U/L (ref 15–41)
Albumin: 3.7 g/dL (ref 3.5–5.0)
Alkaline Phosphatase: 45 U/L (ref 38–126)
Anion gap: 8 (ref 5–15)
BUN: 8 mg/dL (ref 6–20)
CO2: 28 mmol/L (ref 22–32)
Calcium: 9 mg/dL (ref 8.9–10.3)
Chloride: 99 mmol/L (ref 98–111)
Creatinine, Ser: 0.73 mg/dL (ref 0.61–1.24)
GFR calc Af Amer: >60 mL/min (ref >60)
GFR calc non Af Amer: >60 mL/min (ref >60)
Glucose, Bld: 139 mg/dL — ABNORMAL HIGH (ref 70–99)
Potassium: 4.3 mmol/L (ref 3.5–5.1)
Sodium: 135 mmol/L (ref 135–145)
Total Bilirubin: 0.4 mg/dL (ref 0.3–1.2)
Total Protein: 7.1 g/dL (ref 6.5–8.1)

## 2019-03-19 MED FILL — NINLARO 3 MG CAPS: 3 | 28 days supply | Qty: 3 | Fill #2

## 2019-03-19 NOTE — Assessment & Plan Note (Addendum)
#  MULTIPLE MYELOMA-AUG 2019 recurrent;   Oct 2019- PET- no lytic lesions noted.  Clinically  STABLE.   # Currently on Ninlaro-Dex [start again 01/02/2018]- PR noted; JAN 2021 0.3Kappa light chains-slightly abnormal- 2.17. overall stable.  Continue Ninlaro-Dex weekly.  Discussed that I would not recommend "chemo holiday" at this time unless absolutely needed.   # Bilateral subcentimeter lung nodules -CT scan February 2021-stable lung nodules; follow-up CT in 12 months  # COPD/smoking-related changes noted on CT scan; again counseled to quit smoking- advair/spiriva/O2-stable  # Chronic nausea-question secondary to Ninlaro.  Stable  # Chronic pain-stable 15 mgcurrently on buprenorphine pain patch once a week; pain management];Marland Kitchen  No breakthrough pain main medication.  # Neuropathy-stable-on neurontin dose to 400 mg 3 times daily; new prescription refilled.   # ED-previous evaluation with urology; question testosterone injection recommendation.  He wants to wait for his cardiac work-up.  #Pacemaker revision-as per patient awaiting evaluation at [facility].  Discussed that this should not have any bearing on his myeloma status.  # DISPOSITION: # follow up in 1 month- cbc/cmp/MMpanel/K-L light chain;- Dr.B

## 2019-03-19 NOTE — Progress Notes (Signed)
[place] OFFICE PROGRESS NOTE  Patient Care Team: Patient, No Pcp Per as PCP - General (General Practice)  Cancer Staging No matching staging information was found for the patient.   Oncology History Overview Note  # SEP 2015- MULTIPLE MYELOMA [multiple PET pos Bone lesions; hypercalcemia s/p BMBx; FISH- Aneuploidy - gain of chromosome 7,9,15,FGFR3/4p16.3, and CCND1/11q13. Loss of MAF/16q23.1), cytogenetics normal 46XY] s/p Vel-Dex-Rev-Zometa; Excellent PR;   # MARCH 2016- REV-DEX ; FEB 2017-[discn Dex] Rev 25 mg 3 w On & 1 w Off; March 2017- M- protein 0.4gm/dl; K/L= 7.9; cont Rev;   # Revlimid HELD June 2018 [sec to [facility]]  # AUG 2019- RECURRENCE- NINLARO-DEX  # March 2017-  Left Middle Lobe cavitary lesion- ~67m- repeat Ct in 3-463m# AUG 2017- [facility] [[name], [place] #  ? [facility]- [id number] not follow up ]- DISCONT Zometa.; NO HBO sec to   # Chronic pain/Anxiety [ortho]  # COPD/smoking/ [[facility]- not candidate for BMT- sec to co-morbidities/poor nutritional status];    # MARCH 2020-PAIN CONTRACT: BREACH-  DECLINE ANY NARCOTICS/BENZO  # PALLIATIVE CARE: [name] [2020]  ---------------------------------------------------------------  DIAGNOSIS: MULTIPLE MYELOMA  STAGE: RECURRENT  ;GOALS: CONTROL  CURRENT/MOST RECENT THERAPY; AUG 2019- NINLARO-DEX    Multiple myeloma in relapse ([facility] [place]     INTERVAL HISTORY:  [name] [id number].[id number].  male pleasant patient above history of recurrent/relapse multiple myeloma-currently on Ninlaro-dex is here for follow-up/review results of the CT scan.  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   States his pain is better controlled.  Continues to be on buprenorphine pain patch.  Denies any worsening tingling and numbness in the extremities.  Review of Systems  Constitutional: Positive for  malaise/fatigue. Negative for chills, diaphoresis and fever.  HENT: Negative for nosebleeds and sore throat.   Eyes: Negative for double vision.  Respiratory: Positive for cough and shortness of breath. Negative for hemoptysis and sputum production.   Cardiovascular: Negative for chest pain, palpitations, orthopnea and leg swelling.  Gastrointestinal: Negative for abdominal pain, blood in stool, constipation, diarrhea, heartburn, melena and vomiting.  Genitourinary: Negative for dysuria, frequency and urgency.  Musculoskeletal: Positive for back pain and joint pain.  Skin: Negative.  Negative for itching and rash.  Neurological: Positive for tingling. Negative for dizziness, focal weakness, weakness and headaches.  Endo/Heme/Allergies: Does not bruise/bleed easily.  Psychiatric/Behavioral: Negative for depression. The patient is not nervous/anxious and does not have insomnia.       PAST MEDICAL HISTORY :  Past Medical History:  Diagnosis Date  . Anxiety   . Arthritis   . Atrial septal defect   . CHF (congestive heart failure) ([facility] [place]  . COPD (chronic obstructive pulmonary disease) (HCC)    on 3L O2 - continuous  . Coronary artery disease   . CVA (cerebral vascular accident) ([place]   "mild" - no deficits  . Depression   . Dyspnea    with exertion  . Dysrhythmia    nonsustained VT, Afib  . Dysrhythmias   . Environmental and seasonal allergies   . GERD (gastroesophageal reflux disease)   . Hyperlipidemia   . ICD (implantable cardioverter-defibrillator) in place [facility]2011   [facility] ([facility]  . Multiple myeloma (HCC)    multiple myeloma  . Myocardial infarction (HCT[place]  . Neuropathy    feet, legs, hands  .  Presence of permanent cardiac pacemaker    defib pacemaker  . Substance abuse ([place])     PAST SURGICAL HISTORY :   Past Surgical History:  Procedure Laterality Date  . CARDIAC DEFIBRILLATOR PLACEMENT  08/22/2011   [place]  Scientific single chamber placed by Dr Marchia Meiers, [place]  . CATARACT EXTRACTION W/PHACO Right 12/13/2018   Procedure: CATARACT EXTRACTION PHACO AND INTRAOCULAR LENS PLACEMENT ([place]) RIGHT;  Surgeon: Birder Robson, MD;  Location: [facility] ORS;  Service: Ophthalmology;  Laterality: Right;  [place] 01:21.4 CDE 15.56 FLUID PACK LOT # [id number]  . CATARACT EXTRACTION W/PHACO Left 01/23/2019   Procedure: CATARACT EXTRACTION PHACO AND INTRAOCULAR LENS PLACEMENT ([place]) LEFT;  Surgeon: Birder Robson, MD;  Location: [facility] ORS;  Service: Ophthalmology;  Laterality: Left;  [place] 01:13.5 CDE 13.33 Fluid Pack lot # [id number]  . TOOTH EXTRACTION      FAMILY HISTORY :   Family History  Problem Relation Age of Onset  . COPD Mother   . CAD Mother   . Heart attack Father   . Prostate cancer Maternal Grandfather   . Kidney cancer Neg Hx   . Bladder Cancer Neg Hx     SOCIAL HISTORY:   Social History   Tobacco Use  . Smoking status: Current Every Day Smoker    Packs/day: 0.50    Years: 40.00    Pack years: 20.00    Types: Cigarettes  . Smokeless tobacco: Never Used  Substance Use Topics  . Alcohol use: No    Alcohol/week: 0.0 standard drinks  . Drug use: No    ALLERGIES:  has No Known Allergies.  MEDICATIONS:  Current Outpatient Medications  Medication Sig Dispense Refill  . acetaminophen (TYLENOL) 325 MG tablet Take 2 tablets (650 mg total) by mouth every 6 (six) hours as needed for mild pain (or Fever >/= 101). (Patient taking differently: Take 650 mg by mouth every 6 (six) hours as needed for mild pain (or Fever >/= 101). )    . aspirin EC 81 MG EC tablet Take 1 tablet (81 mg total) by mouth daily.    . Buprenorphine (BUTRANS) 15 MCG/HR PTWK Place 1 patch onto the skin once a week.    Marland Kitchen dexamethasone (DECADRON) 4 MG tablet TAKE THREE TABLETS BY MOUTH ONCE A WEEK WITH BREAKFAST (Patient taking differently: Take 12 mg by mouth every Wednesday. ) 30 tablet 4  . DULoxetine (CYMBALTA) 30 MG  capsule Take 1 capsule (30 mg total) by mouth daily. 90 capsule 3  . DUREZOL 0.05 % EMUL Place 1 drop into the right eye daily.    . Fluticasone-Salmeterol (ADVAIR DISKUS) 250-50 MCG/DOSE AEPB Inhale 1 puff into the lungs 2 (two) times daily. 3 each 2  . gabapentin (NEURONTIN) 400 MG capsule Take 1 capsule (400 mg total) by mouth 3 (three) times daily. 90 capsule 1  . ipratropium-albuterol (DUONEB) 0.5-2.5 (3) MG/3ML SOLN Take 3 mLs by nebulization every 4 (four) hours as needed. (Patient taking differently: Take 3 mLs by nebulization every 4 (four) hours as needed. Asthma) 360 mL 3  . ixazomib citrate (NINLARO) 3 MG capsule Take 1 capsule (3 mg total) by mouth every Wednesday. Take for 3 weeks on, then 1 week off. Take on an empty stomach 1hr before or 2hrs after food 3 capsule 4  . metoprolol tartrate (LOPRESSOR) 50 MG tablet Take 50 mg by mouth daily.     . naloxone (NARCAN) nasal spray 4 mg/0.1 mL For Opioid Overdose: Call 911 and administer a single  spray of Narcan in one nostril. Repeat every 24mns as needed if no or minimal response 1 kit 2  . ondansetron (ZOFRAN) 8 MG tablet TAKE ONE TABLET BY MOUTH EVERY 8 HOURS AS NEEDED (Patient taking differently: Take 8 mg by mouth every 8 (eight) hours as needed for nausea or vomiting. ) 60 tablet 3  . OXYGEN Inhale 3 L into the lungs continuous.    .Marland KitchenPROAIR HFA 108 (90 Base) MCG/ACT inhaler INHALE 2 PUFFS BY MOUTH INTO THE LUNGS EVERY 6 HOURS AS NEEDED FOR WHEEZING OR SHORTNESS OF BREATH (Patient taking differently: Inhale 2 puffs into the lungs every 6 (six) hours as needed for wheezing or shortness of breath. ) 8.5 g 6  . tiotropium (SPIRIVA HANDIHALER) 18 MCG inhalation capsule Place 1 capsule (18 mcg total) into inhaler and inhale daily. 30 capsule 6   No current facility-administered medications for this visit.    PHYSICAL EXAMINATION: ECOG PERFORMANCE STATUS: 2 - Symptomatic, <50% confined to bed  BP 95/83 (BP Location: Left Arm, Patient  Position: Sitting, Cuff Size: Normal)   Pulse 96   Temp (!) 96.6 F (35.9 C) (Tympanic)   Wt 116 lb (52.6 kg)   SpO2 100% Comment: on 3liters  BMI 15.73 kg/m   Filed Weights   03/19/19 1503  Weight: 116 lb (52.6 kg)    Physical Exam  Constitutional: He is oriented to person, place, and time.  Cachectic appearing Caucasian male patient; 2 L nasal cannula.  He is alone.  He is walking by himself.  HENT:  Head: Normocephalic and atraumatic.  Mouth/Throat: Oropharynx is clear and moist. No oropharyngeal exudate.  Eyes: Pupils are equal, round, and reactive to light.  Cardiovascular: Normal rate and regular rhythm.  Pulmonary/Chest: No respiratory distress. He has wheezes.  Bilateral decreased air entry.  Abdominal: Soft. Bowel sounds are normal. He exhibits no distension and no mass. There is no abdominal tenderness. There is no rebound and no guarding.  Musculoskeletal:        General: No tenderness or edema. Normal range of motion.     Cervical back: Normal range of motion and neck supple.  Neurological: He is alert and oriented to person, place, and time.  Skin: Skin is warm.  Psychiatric: Affect normal.       LABORATORY DATA:  I have reviewed the data as listed    Component Value Date/Time   NA 135 03/19/2019 1503   NA 135 05/12/2014 1343   K 4.3 03/19/2019 1503   K 3.3 (L) 05/12/2014 1343   CL 99 03/19/2019 1503   CL 101 05/12/2014 1343   CO2 28 03/19/2019 1503   CO2 27 05/12/2014 1343   GLUCOSE 139 (H) 03/19/2019 1503   GLUCOSE 77 05/12/2014 1343   BUN 8 03/19/2019 1503   BUN 9 05/12/2014 1343   CREATININE 0.73 03/19/2019 1503   CREATININE 0.89 05/12/2014 1343   CALCIUM 9.0 03/19/2019 1503   CALCIUM 8.8 (L) 05/12/2014 1343   PROT 7.1 03/19/2019 1503   PROT 5.9 (L) 02/17/2014 1405   ALBUMIN 3.7 03/19/2019 1503   ALBUMIN 2.8 (L) 02/17/2014 1405   AST 17 03/19/2019 1503   AST 9 (L) 02/17/2014 1405   ALT 9 03/19/2019 1503   ALT 12 (L) 02/17/2014 1405    ALKPHOS 45 03/19/2019 1503   ALKPHOS 44 (L) 02/17/2014 1405   BILITOT 0.4 03/19/2019 1503   BILITOT 0.2 02/17/2014 1405   GFRNONAA >60 03/19/2019 1503   GFRNONAA >60 05/12/2014 1343  GFRAA >60 03/19/2019 1503   GFRAA >60 05/12/2014 1343    No results found for: SPEP, UPEP  Lab Results  Component Value Date   WBC 8.5 03/19/2019   NEUTROABS 7.6 03/19/2019   HGB 12.4 (L) 03/19/2019   HCT 39.8 03/19/2019   MCV 89.4 03/19/2019   PLT 222 03/19/2019      Chemistry      Component Value Date/Time   NA 135 03/19/2019 1503   NA 135 05/12/2014 1343   K 4.3 03/19/2019 1503   K 3.3 (L) 05/12/2014 1343   CL 99 03/19/2019 1503   CL 101 05/12/2014 1343   CO2 28 03/19/2019 1503   CO2 27 05/12/2014 1343   BUN 8 03/19/2019 1503   BUN 9 05/12/2014 1343   CREATININE 0.73 03/19/2019 1503   CREATININE 0.89 05/12/2014 1343      Component Value Date/Time   CALCIUM 9.0 03/19/2019 1503   CALCIUM 8.8 (L) 05/12/2014 1343   ALKPHOS 45 03/19/2019 1503   ALKPHOS 44 (L) 02/17/2014 1405   AST 17 03/19/2019 1503   AST 9 (L) 02/17/2014 1405   ALT 9 03/19/2019 1503   ALT 12 (L) 02/17/2014 1405   BILITOT 0.4 03/19/2019 1503   BILITOT 0.2 02/17/2014 1405       RADIOGRAPHIC STUDIES: I have personally reviewed the radiological images as listed and agreed with the findings in the report. CT CHEST WO CONTRAST  Result Date: 03/19/2019 CLINICAL DATA:  Follow-up lung nodules. EXAM: CT CHEST WITHOUT CONTRAST TECHNIQUE: Multidetector CT imaging of the chest was performed following the standard protocol without IV contrast. COMPARISON:  08/21/2018 FINDINGS: Cardiovascular: Left chest wall ICD is noted with lead in the right ventricle. Normal heart size. No pericardial effusion. Aortic atherosclerosis. Left circumflex, lad and RCA coronary artery calcifications. Mediastinum/Nodes: No enlarged mediastinal or axillary lymph nodes. Thyroid gland, trachea, and esophagus demonstrate no significant findings.  Lungs/Pleura: Centrilobular and paraseptal emphysema. Diffuse bilateral ground-glass centrilobular nodularity is identified in both lungs. New from previous exam. Scattered solid lung nodules are again noted. -previous index solid 4 mm nodule within the lingula is unchanged from previous exam, image 109/3. -Posteromedial right upper lobe lung nodule is new measuring 4 mm, image 76/3. -Also new from previous exam is a 3 mm posterior right upper lobe nodule, image 54/3. Upper Abdomen: Aortic atherosclerosis. No acute abnormality identified. Musculoskeletal: Diffuse osteopenia with diffuse lucent lesions throughout the visualized thoracic skeleton compatible with the clinical history of multiple myeloma. Healed fracture of the distal body of sternum. Compression deformities at T6, T9, T12, L1 and L2 are unchanged. Chronic healed right rib deformities. IMPRESSION: 1. No acute cardiopulmonary abnormalities. 2. Previously referenced 4 mm lingular nodule is stable from previous exam. However, there are several new milli metric lung nodules which measure up to 4 mm. No follow-up needed if patient is low-risk (and has no known or suspected primary neoplasm). Non-contrast chest CT can be considered in 12 months if patient is high-risk. This recommendation follows the consensus statement: Guidelines for Management of Incidental Pulmonary Nodules Detected on CT Images: From the Fleischner Society 2017; Radiology 2017; 284:228-243. 3. 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 4. Coronary artery calcifications 5. Diffuse osteopenia with multiple lucent lesions throughout the visualized bony thorax compatible with the history of multiple myeloma. Aortic Atherosclerosis (ICD10-I70.0) and Emphysema (ICD10-J43.9). Electronically Signed   By: Lovena Le  Clovis Riley M.D.    On: 03/19/2019 10:03     ASSESSMENT & PLAN:  Multiple myeloma in relapse ([place]) # MULTIPLE MYELOMA-AUG 2019 recurrent;   Oct 2019- PET- no lytic lesions noted.  Clinically  STABLE.   # Currently on Ninlaro-Dex [start again 01/02/2018]- PR noted; JAN 2021 0.3Kappa light chains-slightly abnormal- 2.17. overall stable.  Continue Ninlaro-Dex weekly.  Discussed that I would not recommend "chemo holiday" at this time unless absolutely needed.   # Bilateral subcentimeter lung nodules -CT scan February 2021-stable lung nodules; follow-up CT in 12 months  # COPD/smoking-related changes noted on CT scan; again counseled to quit smoking- advair/spiriva/O2-stable  # Chronic nausea-question secondary to Ninlaro.  Stable  # Chronic pain-stable 15 mgcurrently on buprenorphine pain patch once a week; pain management];Marland Kitchen  No breakthrough pain main medication.  # Neuropathy-stable-on neurontin dose to 400 mg 3 times daily; new prescription refilled.   # ED-previous evaluation with urology; question testosterone injection recommendation.  He wants to wait for his cardiac work-up.  #Pacemaker revision-as per patient awaiting [facility].  Discussed that this should not have any bearing on his myeloma status.  # DISPOSITION: # follow up in 1 month- cbc/cmp/MMpanel/K-L light chain;- Dr.B   Orders Placed This Encounter  Procedures  . CBC with Differential    Standing Status:   Future    Standing Expiration Date:   03/18/2020  . Comprehensive metabolic panel    Standing Status:   Future    Standing Expiration Date:   03/18/2020  . Multiple Myeloma Panel (SPEP&IFE w/QIG)    Standing Status:   Future    Standing Expiration Date:   03/18/2020  . Kappa/lambda light chains    Standing Status:   Future    Standing Expiration Date:   03/18/2020   All questions were answered. The patient knows to call the clinic with any problems, questions or concerns.      Cammie Sickle, MD 03/20/2019 7:23  AM

## 2019-03-20 LAB — KAPPA/LAMBDA LIGHT CHAINS
Kappa free light chain: 11.2 mg/L (ref 3.3–19.4)
Kappa, lambda light chain ratio: 2.38 — ABNORMAL HIGH (ref 0.26–1.65)
Lambda free light chains: 4.7 mg/L — ABNORMAL LOW (ref 5.7–26.3)

## 2019-03-24 LAB — MULTIPLE MYELOMA PANEL, SERUM
Albumin SerPl Elph-Mcnc: 3.4 g/dL (ref 2.9–4.4)
Albumin/Glob SerPl: 1.2 (ref 0.7–1.7)
Alpha 1: 0.4 g/dL (ref 0.0–0.4)
Alpha2 Glob SerPl Elph-Mcnc: 0.9 g/dL (ref 0.4–1.0)
B-Globulin SerPl Elph-Mcnc: 1 g/dL (ref 0.7–1.3)
Gamma Glob SerPl Elph-Mcnc: 0.8 g/dL (ref 0.4–1.8)
Globulin, Total: 3 g/dL (ref 2.2–3.9)
IgA: 68 mg/dL — ABNORMAL LOW (ref 90–386)
IgG (Immunoglobin G), Serum: 878 mg/dL (ref 603–1613)
IgM (Immunoglobulin M), Srm: 40 mg/dL (ref 20–172)
M Protein SerPl Elph-Mcnc: 0.3 g/dL — ABNORMAL HIGH
Total Protein ELP: 6.4 g/dL (ref 6.0–8.5)

## 2019-03-25 ENCOUNTER — Other Ambulatory Visit: Payer: Self-pay

## 2019-03-25 ENCOUNTER — Ambulatory Visit (INDEPENDENT_AMBULATORY_CARE_PROVIDER_SITE_OTHER): Payer: Medicaid Other | Admitting: Adult Health

## 2019-03-25 ENCOUNTER — Encounter: Payer: Self-pay | Admitting: Adult Health

## 2019-03-25 VITALS — BP 94/62 | HR 98 | Temp 98.0°F | Ht 72.0 in | Wt 113.6 lb

## 2019-03-25 DIAGNOSIS — J9611 Chronic respiratory failure with hypoxia: Secondary | ICD-10-CM | POA: Insufficient documentation

## 2019-03-25 DIAGNOSIS — J449 Chronic obstructive pulmonary disease, unspecified: Secondary | ICD-10-CM | POA: Diagnosis not present

## 2019-03-25 DIAGNOSIS — E43 Unspecified severe protein-calorie malnutrition: Secondary | ICD-10-CM

## 2019-03-25 DIAGNOSIS — R9389 Abnormal findings on diagnostic imaging of other specified body structures: Secondary | ICD-10-CM | POA: Diagnosis not present

## 2019-03-25 NOTE — Assessment & Plan Note (Signed)
Advised on a high-protein diet.  Continue with boost supplement

## 2019-03-25 NOTE — Assessment & Plan Note (Signed)
CT chest shows stable lung nodules however new development of groundglass opacities suspicious for RB ILD.  Advised patient that smoking cessation is key We will need to follow these areas closely.  Previous PFT 2 years ago showed severe COPD .  May need to repeat this going forward.  Plan  Patient Instructions  Continue on Advair and Spiriva .  Activity as tolerated  Continue on Oxygen 3l/m .  Order for POC .  Most important is to quit smoking .  Will need to watch areas on CT chest closely .  Follow up with Dr. Mortimer Fries 3-4 months and As needed

## 2019-03-25 NOTE — Progress Notes (Signed)
@Patient  ID: [name], male    DOB: [birth year], 59 y.o.   MRN: [id number]  Chief Complaint  Patient presents with  . Follow-up    COPD     Referring provider: No ref. provider found  HPI: 59 year old male active smoker followed for COPD and chronic hypoxic respiratory failure on oxygen Medical history significant for multiple myeloma  TEST/EVENTS :   03/25/2019 Follow up : COPD . O2 RF , lung nodules Patient presents for a 1 year follow-up.  Patient has underlying severe COPD.  He is oxygen dependent on 3 L at home.  Patient says overall breathing is doing about the same.  He gets short of breath with minimum activity.  Has minimally productive cough.  He denies any hemoptysis chest pain orthopnea PND or increased leg swelling.  Patient does continue to smoke.smoking 1/2 PPD .   Smoking cessation was discussed. Patient education on dangers of oxygen and smoking. On Advair and Spiriva .  Has 61 year old at home.  Able to do light chores.  PFTs November 2018 showed FEV1 34%, ratio 45, FVC 61%, positive bronchodilator response (30% change), DLCO 63%  Patient is on oxygen at home at 3 L.  Says his oxygen helps him.  Today walk test in the office shows O2 saturations at 81% on room air.  O2 saturations on 3 L greater than 90%. On disability .   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 Stable scattered solid lung nodules.  Follows with Oncology for Multiple Myeloma , on active treatment .   Drinks boost daily . Low appetite .    No Known Allergies  Immunization History  Administered Date(s) Administered  . Influenza,inj,Quad PF,6+ Mos 11/20/2017  . Influenza-Unspecified 11/11/2014    Past Medical History:  Diagnosis Date  . Anxiety   . Arthritis   . Atrial septal defect   . CHF  (congestive heart failure) ([place])   . COPD (chronic obstructive pulmonary disease) (HCC)    on 3L O2 - continuous  . Coronary artery disease   . CVA (cerebral vascular accident) ([place]) 02/2018   "mild" - no deficits  . Depression   . Dyspnea    with exertion  . Dysrhythmia    nonsustained VT, Afib  . Dysrhythmias   . Environmental and seasonal allergies   . GERD (gastroesophageal reflux disease)   . Hyperlipidemia   . 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 Malnourished ([place])   . Multiple myeloma (HCC)    multiple myeloma  . Myocardial infarction ([place])   . Neuropathy    feet, legs, hands  . Presence of permanent cardiac pacemaker    defib pacemaker  . Substance abuse ([place])     Tobacco History: Social History   Tobacco Use  Smoking Status Current Every Day Smoker  . Packs/day: 0.50  . Years: 40.00  . Pack years: 20.00  . Types: Cigarettes  Smokeless Tobacco Never Used   Ready to quit: No Counseling given: Yes   Outpatient Medications Prior to Visit  Medication Sig Dispense Refill  . acetaminophen (TYLENOL) 325 MG tablet Take 2 tablets (650 mg total) by mouth every 6 (six) hours as needed for mild pain (or Fever >/= 101). (Patient taking differently: Take 650 mg by mouth every 6 (six) hours as needed for mild pain (or  Fever >/= 101). )    . aspirin EC 81 MG EC tablet Take 1 tablet (81 mg total) by mouth daily.    . Buprenorphine (BUTRANS) 15 MCG/HR PTWK Place 1 patch onto the skin once a week.    Marland Kitchen dexamethasone (DECADRON) 4 MG tablet TAKE THREE TABLETS BY MOUTH ONCE A WEEK WITH BREAKFAST (Patient taking differently: Take 12 mg by mouth every Wednesday. ) 30 tablet 4  . DULoxetine (CYMBALTA) 30 MG capsule Take 1 capsule (30 mg total) by mouth daily. 90 capsule 3  . Fluticasone-Salmeterol (ADVAIR DISKUS) 250-50 MCG/DOSE AEPB Inhale 1 puff into the lungs 2 (two) times daily. 3 each 2  . gabapentin (NEURONTIN)  400 MG capsule Take 1 capsule (400 mg total) by mouth 3 (three) times daily. 90 capsule 1  . ipratropium-albuterol (DUONEB) 0.5-2.5 (3) MG/3ML SOLN Take 3 mLs by nebulization every 4 (four) hours as needed. (Patient taking differently: Take 3 mLs by nebulization every 4 (four) hours as needed. Asthma) 360 mL 3  . ixazomib citrate (NINLARO) 3 MG capsule Take 1 capsule (3 mg total) by mouth every Wednesday. Take for 3 weeks on, then 1 week off. Take on an empty stomach 1hr before or 2hrs after food 3 capsule 4  . metoprolol tartrate (LOPRESSOR) 50 MG tablet Take 50 mg by mouth daily.     . naloxone (NARCAN) nasal spray 4 mg/0.1 mL For Opioid Overdose: Call 911 and administer a single spray of Narcan in one nostril. Repeat every 30mns as needed if no or minimal response 1 kit 2  . ondansetron (ZOFRAN) 8 MG tablet TAKE ONE TABLET BY MOUTH EVERY 8 HOURS AS NEEDED (Patient taking differently: Take 8 mg by mouth every 8 (eight) hours as needed for nausea or vomiting. ) 60 tablet 3  . OXYGEN Inhale 3 L into the lungs continuous.    .Marland KitchenPROAIR HFA 108 (90 Base) MCG/ACT inhaler INHALE 2 PUFFS BY MOUTH INTO THE LUNGS EVERY 6 HOURS AS NEEDED FOR WHEEZING OR SHORTNESS OF BREATH (Patient taking differently: Inhale 2 puffs into the lungs every 6 (six) hours as needed for wheezing or shortness of breath. ) 8.5 g 6  . tiotropium (SPIRIVA HANDIHALER) 18 MCG inhalation capsule Place 1 capsule (18 mcg total) into inhaler and inhale daily. 30 capsule 6  . DUREZOL 0.05 % EMUL Place 1 drop into the right eye daily.     No facility-administered medications prior to visit.     Review of Systems:   Constitutional:   No  weight loss, night sweats,  Fevers, chills,  +fatigue, or  lassitude.  HEENT:   No headaches,  Difficulty swallowing,  Tooth/dental problems, or  Sore throat,                No sneezing, itching, ear ache, nasal congestion, post nasal drip,   CV:  No chest pain,  Orthopnea, PND, swelling in lower  extremities, anasarca, dizziness, palpitations, syncope.   GI  No heartburn, indigestion, abdominal pain, nausea, vomiting, diarrhea, change in bowel habits, loss of appetite, bloody stools.   Resp:   No chest wall deformity  Skin: no rash or lesions.  GU: no dysuria, change in color of urine, no urgency or frequency.  No flank pain, no hematuria   MS: Chronic pain++    Physical Exam  BP 94/62 (BP Location: Left Arm, Patient Position: Sitting, Cuff Size: Large)   Pulse 98   Temp 98 F (36.7 C) (Temporal)   Ht 6' (  1.829 m)   Wt 113 lb 9.6 oz (51.5 kg)   SpO2 99% Comment: on ra resting. pt normally on 3L but doing O2 recert today.  BMI 15.41 kg/m   GEN: A/Ox3; pleasant , thin frail and elderly   HEENT:  [place]/AT,  NOSE-clear, THROAT-clear, no lesions, no postnasal drip or exudate noted.   NECK:  Supple w/ fair ROM; no JVD; normal carotid impulses w/o bruits; no thyromegaly or nodules palpated; no lymphadenopathy.    RESP  Clear  P & A; w/o, wheezes/ rales/ or rhonchi. no accessory muscle use, no dullness to percussion  CARD:  RRR, no m/r/g, no peripheral edema, pulses intact, no cyanosis or clubbing.  GI:   Soft & nt; nml bowel sounds; no organomegaly or masses detected.   Musco: Warm bil, no deformities or joint swelling noted.   Neuro: alert, no focal deficits noted.    Skin: Warm, no lesions or rashes    Lab Results:  CBC ProBNP No results found for: PROBNP  Imaging: CT CHEST WO CONTRAST  Result Date: 03/19/2019 CLINICAL DATA:  Follow-up lung nodules. EXAM: CT CHEST WITHOUT CONTRAST TECHNIQUE: Multidetector CT imaging of the chest was performed following the standard protocol without IV contrast. COMPARISON:  08/21/2018 FINDINGS: Cardiovascular: Left chest wall ICD is noted with lead in the right ventricle. Normal heart size. No pericardial effusion. Aortic atherosclerosis. Left circumflex, lad and RCA coronary artery calcifications. Mediastinum/Nodes: No enlarged  mediastinal or axillary lymph nodes. Thyroid gland, trachea, and esophagus demonstrate no significant findings. Lungs/Pleura: Centrilobular and paraseptal emphysema. Diffuse bilateral ground-glass centrilobular nodularity is identified in both lungs. New from previous exam. Scattered solid lung nodules are again noted. -previous index solid 4 mm nodule within the lingula is unchanged from previous exam, image 109/3. -Posteromedial right upper lobe lung nodule is new measuring 4 mm, image 76/3. -Also new from previous exam is a 3 mm posterior right upper lobe nodule, image 54/3. Upper Abdomen: Aortic atherosclerosis. No acute abnormality identified. Musculoskeletal: Diffuse osteopenia with diffuse lucent lesions throughout the visualized thoracic skeleton compatible with the clinical history of multiple myeloma. Healed fracture of the distal body of sternum. Compression deformities at T6, T9, T12, L1 and L2 are unchanged. Chronic healed right rib deformities. IMPRESSION: 1. No acute cardiopulmonary abnormalities. 2. Previously referenced 4 mm lingular nodule is stable from previous exam. However, there are several new milli metric lung nodules which measure up to 4 mm. No follow-up needed if patient is low-risk (and has no known or suspected primary neoplasm). Non-contrast chest CT can be considered in 12 months if patient is high-risk. This recommendation follows the consensus statement: Guidelines for Management of Incidental Pulmonary Nodules Detected on CT Images: From the Fleischner Society 2017; Radiology 2017; 284:228-243. 3. 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 4. Coronary artery calcifications 5. Diffuse osteopenia with multiple lucent lesions throughout the visualized bony thorax compatible with the history of multiple myeloma. Aortic  Atherosclerosis (ICD10-I70.0) and Emphysema (ICD10-J43.9). Electronically Signed   By: Kerby Moors M.D.   On: 03/19/2019 10:03      No flowsheet data found.  No results found for: NITRICOXIDE      Assessment & Plan:   Chronic obstructive pulmonary disease ([place]) COPD currently stable on present regimen. Smoking cessation discussed  Plan  Patient Instructions  Continue on Advair and Spiriva .  Activity as tolerated  Continue  on Oxygen 3l/m .  Order for POC .  Most important is to quit smoking .  Will need to watch areas on CT chest closely .  Follow up with Dr. Mortimer Fries 3-4 months and As needed         Chronic respiratory failure with hypoxia ([place]) Continue on oxygen 3 L.  Order for  POC  Abnormal CT of the chest CT chest shows stable lung nodules however new development of groundglass opacities suspicious for RB ILD.  Advised patient that smoking cessation is key We will need to follow these areas closely.  Previous PFT 2 years ago showed severe COPD .  May need to repeat this going forward.  Plan  Patient Instructions  Continue on Advair and Spiriva .  Activity as tolerated  Continue on Oxygen 3l/m .  Order for POC .  Most important is to quit smoking .  Will need to watch areas on CT chest closely .  Follow up with Dr. Mortimer Fries 3-4 months and As needed            Rexene Edison, NP 03/25/2019

## 2019-03-25 NOTE — Patient Instructions (Addendum)
Continue on Advair and Spiriva .  Activity as tolerated  Continue on Oxygen 3l/m .  Order for POC .  Most important is to quit smoking .  Will need to watch areas on CT chest closely .  Follow up with Dr. Mortimer Fries 3-4 months and As needed

## 2019-03-25 NOTE — Assessment & Plan Note (Signed)
COPD currently stable on present regimen. Smoking cessation discussed  Plan  Patient Instructions  Continue on Advair and Spiriva .  Activity as tolerated  Continue on Oxygen 3l/m .  Order for POC .  Most important is to quit smoking .  Will need to watch areas on CT chest closely .  Follow up with Dr. Mortimer Fries 3-4 months and As needed

## 2019-03-25 NOTE — Assessment & Plan Note (Signed)
Continue on oxygen 3 L.  Order for  POC

## 2019-04-11 ENCOUNTER — Other Ambulatory Visit: Payer: Self-pay | Admitting: Internal Medicine

## 2019-04-11 DIAGNOSIS — C9002 Multiple myeloma in relapse: Secondary | ICD-10-CM

## 2019-04-15 ENCOUNTER — Other Ambulatory Visit: Payer: Self-pay

## 2019-04-15 ENCOUNTER — Encounter: Payer: Self-pay | Admitting: Internal Medicine

## 2019-04-15 NOTE — Progress Notes (Signed)
Patient pre screened for office appointment, no questions or concerns today. Patient reminded of upcoming appointment time and date. 

## 2019-04-16 ENCOUNTER — Inpatient Hospital Stay: Payer: Medicaid Other | Attending: Internal Medicine

## 2019-04-16 ENCOUNTER — Other Ambulatory Visit: Payer: Self-pay

## 2019-04-16 ENCOUNTER — Inpatient Hospital Stay (HOSPITAL_BASED_OUTPATIENT_CLINIC_OR_DEPARTMENT_OTHER): Payer: Medicaid Other | Admitting: Internal Medicine

## 2019-04-16 DIAGNOSIS — C9002 Multiple myeloma in relapse: Secondary | ICD-10-CM

## 2019-04-16 DIAGNOSIS — I2510 Atherosclerotic heart disease of native coronary artery without angina pectoris: Secondary | ICD-10-CM | POA: Diagnosis not present

## 2019-04-16 DIAGNOSIS — F329 Major depressive disorder, single episode, unspecified: Secondary | ICD-10-CM | POA: Diagnosis not present

## 2019-04-16 DIAGNOSIS — J449 Chronic obstructive pulmonary disease, unspecified: Secondary | ICD-10-CM | POA: Insufficient documentation

## 2019-04-16 DIAGNOSIS — G629 Polyneuropathy, unspecified: Secondary | ICD-10-CM | POA: Insufficient documentation

## 2019-04-16 DIAGNOSIS — Z8249 Family history of ischemic heart disease and other diseases of the circulatory system: Secondary | ICD-10-CM | POA: Diagnosis not present

## 2019-04-16 DIAGNOSIS — I4891 Unspecified atrial fibrillation: Secondary | ICD-10-CM | POA: Diagnosis not present

## 2019-04-16 DIAGNOSIS — I252 Old myocardial infarction: Secondary | ICD-10-CM | POA: Insufficient documentation

## 2019-04-16 DIAGNOSIS — Z7951 Long term (current) use of inhaled steroids: Secondary | ICD-10-CM | POA: Insufficient documentation

## 2019-04-16 DIAGNOSIS — Z79899 Other long term (current) drug therapy: Secondary | ICD-10-CM | POA: Insufficient documentation

## 2019-04-16 DIAGNOSIS — Z7952 Long term (current) use of systemic steroids: Secondary | ICD-10-CM | POA: Diagnosis not present

## 2019-04-16 DIAGNOSIS — Z8673 Personal history of transient ischemic attack (TIA), and cerebral infarction without residual deficits: Secondary | ICD-10-CM | POA: Diagnosis not present

## 2019-04-16 DIAGNOSIS — R918 Other nonspecific abnormal finding of lung field: Secondary | ICD-10-CM | POA: Insufficient documentation

## 2019-04-16 DIAGNOSIS — F419 Anxiety disorder, unspecified: Secondary | ICD-10-CM | POA: Insufficient documentation

## 2019-04-16 DIAGNOSIS — D649 Anemia, unspecified: Secondary | ICD-10-CM | POA: Insufficient documentation

## 2019-04-16 DIAGNOSIS — Z7982 Long term (current) use of aspirin: Secondary | ICD-10-CM | POA: Diagnosis not present

## 2019-04-16 DIAGNOSIS — E785 Hyperlipidemia, unspecified: Secondary | ICD-10-CM | POA: Diagnosis not present

## 2019-04-16 DIAGNOSIS — F17210 Nicotine dependence, cigarettes, uncomplicated: Secondary | ICD-10-CM | POA: Insufficient documentation

## 2019-04-16 LAB — CBC WITH DIFFERENTIAL/PLATELET
Abs Immature Granulocytes: 0.05 10*3/uL (ref 0.00–0.07)
Basophils Absolute: 0 10*3/uL (ref 0.0–0.1)
Basophils Relative: 0 %
Eosinophils Absolute: 0 10*3/uL (ref 0.0–0.5)
Eosinophils Relative: 0 %
HCT: 34.8 % — ABNORMAL LOW (ref 39.0–52.0)
Hemoglobin: 11.5 g/dL — ABNORMAL LOW (ref 13.0–17.0)
Immature Granulocytes: 1 %
Lymphocytes Relative: 11 %
Lymphs Abs: 1.2 10*3/uL (ref 0.7–4.0)
MCH: 28.5 pg (ref 26.0–34.0)
MCHC: 33 g/dL (ref 30.0–36.0)
MCV: 86.1 fL (ref 80.0–100.0)
Monocytes Absolute: 1.2 10*3/uL — ABNORMAL HIGH (ref 0.1–1.0)
Monocytes Relative: 11 %
Neutro Abs: 8.3 10*3/uL — ABNORMAL HIGH (ref 1.7–7.7)
Neutrophils Relative %: 77 %
Platelets: 225 10*3/uL (ref 150–400)
RBC: 4.04 MIL/uL — ABNORMAL LOW (ref 4.22–5.81)
RDW: 14.1 % (ref 11.5–15.5)
WBC: 10.8 10*3/uL — ABNORMAL HIGH (ref 4.0–10.5)
nRBC: 0 % (ref 0.0–0.2)

## 2019-04-16 LAB — COMPREHENSIVE METABOLIC PANEL
ALT: 10 U/L (ref 0–44)
AST: 17 U/L (ref 15–41)
Albumin: 4 g/dL (ref 3.5–5.0)
Alkaline Phosphatase: 42 U/L (ref 38–126)
Anion gap: 9 (ref 5–15)
BUN: 9 mg/dL (ref 6–20)
CO2: 26 mmol/L (ref 22–32)
Calcium: 9 mg/dL (ref 8.9–10.3)
Chloride: 100 mmol/L (ref 98–111)
Creatinine, Ser: 0.85 mg/dL (ref 0.61–1.24)
GFR calc Af Amer: >60 mL/min (ref >60)
GFR calc non Af Amer: >60 mL/min (ref >60)
Glucose, Bld: 108 mg/dL — ABNORMAL HIGH (ref 70–99)
Potassium: 3.9 mmol/L (ref 3.5–5.1)
Sodium: 135 mmol/L (ref 135–145)
Total Bilirubin: 0.4 mg/dL (ref 0.3–1.2)
Total Protein: 6.6 g/dL (ref 6.5–8.1)

## 2019-04-16 NOTE — Assessment & Plan Note (Addendum)
#  MULTIPLE MYELOMA-AUG 2019 recurrent;   Oct 2019- PET- no lytic lesions noted.  Stable.   # Currently on Ninlaro-Dex [start again 01/02/2018]- PR noted; FEB 2021 0.3; Kappa light chains-slightly abnormal- 2.65. STABLE.  Continue Ninlaro-Dex weekly-would not recommend treatment break.  #Mild anemia hemoglobin 11.5; otherwise renal function calcium normal.  Monitor for now. If worse-would recommend iron studies.  # Bilateral subcentimeter lung nodules -CT scan February 2021-stable lung nodules; follow-up CT in 12 months.  Stable  # weight loss-likely secondary to underlying COPD.  Discussed regarding evaluation with nutrition patient declined.  # COPD/smoking-related changes noted on CT scan; again counseled to quit smoking- advair/spiriva/O2-stable  # Chronic nausea-question secondary to Ninlaro.  Stable  # Chronic pain-stable 15 mgcurrently on buprenorphine pain patch once a week; pain management];Marland Kitchen  No breakthrough pain main medication.  # Neuropathy-stable-on neurontin dose to 400 mg 3 times daily;   # DISPOSITION: # follow up in 1 month- cbc/cmp/MMpanel/K-L light chain;- Dr.B

## 2019-04-16 NOTE — Progress Notes (Signed)
[place] OFFICE PROGRESS NOTE  Patient Care Team: Patient, No Pcp Per as PCP - General (General Practice)  Cancer Staging No matching staging information was found for the patient.   Oncology History Overview Note  # SEP 2015- MULTIPLE MYELOMA [multiple PET pos Bone lesions; hypercalcemia s/p BMBx; FISH- Aneuploidy - gain of chromosome 7,9,15,FGFR3/4p16.3, and CCND1/11q13. Loss of MAF/16q23.1), cytogenetics normal 46XY] s/p Vel-Dex-Rev-Zometa; Excellent PR;   # MARCH 2016- REV-DEX ; FEB 2017-[discn Dex] Rev 25 mg 3 w On & 1 w Off; March 2017- M- protein 0.4gm/dl; K/L= 7.9; cont Rev;   # Revlimid HELD June 2018 [sec to [facility]]  # AUG 2019- RECURRENCE- NINLARO-DEX  # March 2017-  Left Middle Lobe cavitary lesion- ~5m- repeat Ct in 3-448m# AUG 2017- [facility] [[name], [place] #  ? [facility]- [id number] not follow up ]- DISCONT Zometa.; NO HBO sec to   # Chronic pain/Anxiety [ortho]  # COPD/smoking/ [[facility]- not candidate for BMT- sec to co-morbidities/poor nutritional status];    # MARCH 2020-PAIN CONTRACT: BREACH-  DECLINE ANY NARCOTICS/BENZO  # PALLIATIVE CARE: [name] [2020]  ---------------------------------------------------------------  DIAGNOSIS: MULTIPLE MYELOMA  STAGE: RECURRENT  ;GOALS: CONTROL  CURRENT/MOST RECENT THERAPY; AUG 2019- NINLARO-DEX    [place] in [place] ([place]     INTERVAL HISTORY:  [name] [age].[id number].  male pleasant patient above history of recurrent/relapse multiple myeloma-currently on Ninlaro-dex is here for follow-up.  Patient is currently status post revision of his pacemaker/defibrillator at [facility] He is concerned about his weight loss; he states his appetite is good.  However in the last 3 months he has gained about 6 pounds.  Chronic nausea no vomiting.  Continues to have chronic pain for which he is on the buprenorphine pain patch.  He continues to be on gabapentin for his chronic tingling and numbness in  extremities.   Review of Systems  Constitutional: Positive for malaise/fatigue. Negative for chills, diaphoresis and fever.  HENT: Negative for nosebleeds and sore throat.   Eyes: Negative for double vision.  Respiratory: Positive for cough and shortness of breath. Negative for hemoptysis and sputum production.   Cardiovascular: Negative for chest pain, palpitations, orthopnea and leg swelling.  Gastrointestinal: Negative for abdominal pain, blood in stool, constipation, diarrhea, heartburn, melena and vomiting.  Genitourinary: Negative for dysuria, frequency and urgency.  Musculoskeletal: Positive for back pain and joint pain.  Skin: Negative.  Negative for itching and rash.  Neurological: Positive for tingling. Negative for dizziness, focal weakness, weakness and headaches.  Endo/Heme/Allergies: Does not bruise/bleed easily.  Psychiatric/Behavioral: Negative for depression. The patient is not nervous/anxious and does not have insomnia.       PAST MEDICAL HISTORY :  Past Medical History:  Diagnosis Date  . Anxiety   . Arthritis   . Atrial septal defect   . CHF (congestive heart failure) ([facility]  . COPD (chronic obstructive pulmonary disease) (HCC)    on 3L O2 - continuous  . Coronary artery disease   . CVA (cerebral vascular accident) ([facility]   "mild" - no deficits  . Depression   . Dyspnea    with exertion  . Dysrhythmia    nonsustained VT, Afib  . Dysrhythmias   . Environmental and seasonal allergies   . GERD (gastroesophageal reflux disease)   . Hyperlipidemia   . 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 ([facility]  . Multiple myeloma ([place]  multiple myeloma  . Myocardial infarction ([place])   . Neuropathy    feet, legs, hands  . Presence of permanent cardiac pacemaker    defib pacemaker  . Substance abuse ([place])     PAST SURGICAL HISTORY :   Past Surgical History:  Procedure  Laterality Date  . CARDIAC DEFIBRILLATOR PLACEMENT  08/22/2011   [employer] single chamber placed by Dr Marchia Meiers, [place]  . CATARACT EXTRACTION W/PHACO Right 12/13/2018   Procedure: CATARACT EXTRACTION PHACO AND INTRAOCULAR LENS PLACEMENT ([place]) RIGHT;  Surgeon: Birder Robson, MD;  Location: [facility] ORS;  Service: Ophthalmology;  Laterality: Right;  [place] 01:21.4 CDE 15.56 FLUID PACK LOT # [id number]  . CATARACT EXTRACTION W/PHACO Left 01/23/2019   Procedure: CATARACT EXTRACTION PHACO AND INTRAOCULAR LENS PLACEMENT ([place]) LEFT;  Surgeon: Birder Robson, MD;  Location: [facility] ORS;  Service: Ophthalmology;  Laterality: Left;  [place] 01:13.5 CDE 13.33 Fluid Pack lot # [id number]  . TOOTH EXTRACTION      FAMILY HISTORY :   Family History  Problem Relation Age of Onset  . COPD Mother   . CAD Mother   . Heart attack Father   . Prostate cancer Maternal Grandfather   . Kidney cancer Neg Hx   . Bladder Cancer Neg Hx     SOCIAL HISTORY:   Social History   Tobacco Use  . Smoking status: Current Every Day Smoker    Packs/day: 0.50    Years: 40.00    Pack years: 20.00    Types: Cigarettes  . Smokeless tobacco: Never Used  Substance Use Topics  . Alcohol use: No    Alcohol/week: 0.0 standard drinks  . Drug use: No    ALLERGIES:  has No Known Allergies.  MEDICATIONS:  Current Outpatient Medications  Medication Sig Dispense Refill  . acetaminophen (TYLENOL) 325 MG tablet Take 2 tablets (650 mg total) by mouth every 6 (six) hours as needed for mild pain (or Fever >/= 101). (Patient taking differently: Take 650 mg by mouth every 6 (six) hours as needed for mild pain (or Fever >/= 101). )    . aspirin EC 81 MG EC tablet Take 1 tablet (81 mg total) by mouth daily.    . Buprenorphine (BUTRANS) 15 MCG/HR PTWK Place 1 patch onto the skin once a week.    Marland Kitchen dexamethasone (DECADRON) 4 MG tablet TAKE THREE TABLETS BY MOUTH ONCE A WEEK IN MORNING WITH BREAKFAST 30 tablet 4  . DULoxetine  (CYMBALTA) 30 MG capsule Take 1 capsule (30 mg total) by mouth daily. 90 capsule 3  . Fluticasone-Salmeterol (ADVAIR DISKUS) 250-50 MCG/DOSE AEPB Inhale 1 puff into the lungs 2 (two) times daily. 3 each 2  . gabapentin (NEURONTIN) 400 MG capsule TAKE ONE CAPSULE BY MOUTH THREE TIMES DAILY 90 capsule 1  . ipratropium-albuterol (DUONEB) 0.5-2.5 (3) MG/3ML SOLN Take 3 mLs by nebulization every 4 (four) hours as needed. (Patient taking differently: Take 3 mLs by nebulization every 4 (four) hours as needed. Asthma) 360 mL 3  . ixazomib citrate (NINLARO) 3 MG capsule Take 1 capsule (3 mg total) by mouth every Wednesday. Take for 3 weeks on, then 1 week off. Take on an empty stomach 1hr before or 2hrs after food 3 capsule 4  . metoprolol tartrate (LOPRESSOR) 50 MG tablet Take 50 mg by mouth daily.     . naloxone (NARCAN) nasal spray 4 mg/0.1 mL For Opioid Overdose: Call 911 and administer a single spray of Narcan in one nostril. Repeat every 1mns as  needed if no or minimal response 1 kit 2  . ondansetron (ZOFRAN) 8 MG tablet TAKE ONE TABLET BY MOUTH EVERY 8 HOURS AS NEEDED (Patient taking differently: Take 8 mg by mouth every 8 (eight) hours as needed for nausea or vomiting. ) 60 tablet 3  . OXYGEN Inhale 3 L into the lungs continuous.    . polyethylene glycol powder (GLYCOLAX/MIRALAX) 17 GM/SCOOP powder Take 17 g by mouth daily.    . potassium chloride SA (KLOR-CON) 20 MEQ tablet Take 1 tablet by mouth daily.    Marland Kitchen PROAIR HFA 108 (90 Base) MCG/ACT inhaler INHALE 2 PUFFS BY MOUTH INTO THE LUNGS EVERY 6 HOURS AS NEEDED FOR WHEEZING OR SHORTNESS OF BREATH (Patient taking differently: Inhale 2 puffs into the lungs every 6 (six) hours as needed for wheezing or shortness of breath. ) 8.5 g 6  . tiotropium (SPIRIVA HANDIHALER) 18 MCG inhalation capsule Place 1 capsule (18 mcg total) into inhaler and inhale daily. 30 capsule 6   No current facility-administered medications for this visit.    PHYSICAL  EXAMINATION: ECOG PERFORMANCE STATUS: 2 - Symptomatic, <50% confined to bed  BP 102/84   Pulse (!) 101   Temp (!) 96.7 F (35.9 C) (Tympanic)   Resp 20   Ht 6' (1.829 m)   Wt 119 lb (54 kg)   BMI 16.14 kg/m   Filed Weights   04/16/19 1502  Weight: 119 lb (54 kg)    Physical Exam  Constitutional: He is oriented to person, place, and time.  Cachectic appearing Caucasian male patient; on room air.  He is alone.  He is walking by himself.  HENT:  Head: Normocephalic and atraumatic.  Mouth/Throat: Oropharynx is clear and moist. No oropharyngeal exudate.  Eyes: Pupils are equal, round, and reactive to light.  Cardiovascular: Normal rate and regular rhythm.  Pulmonary/Chest: No respiratory distress.  Bilateral decreased air entry.  Abdominal: Soft. Bowel sounds are normal. He exhibits no distension and no mass. There is no abdominal tenderness. There is no rebound and no guarding.  Musculoskeletal:        General: No tenderness or edema. Normal range of motion.     Cervical back: Normal range of motion and neck supple.  Neurological: He is alert and oriented to person, place, and time.  Skin: Skin is warm.  Psychiatric: Affect normal.       LABORATORY DATA:  I have reviewed the data as listed    Component Value Date/Time   NA 135 04/16/2019 1443   NA 135 05/12/2014 1343   K 3.9 04/16/2019 1443   K 3.3 (L) 05/12/2014 1343   CL 100 04/16/2019 1443   CL 101 05/12/2014 1343   CO2 26 04/16/2019 1443   CO2 27 05/12/2014 1343   GLUCOSE 108 (H) 04/16/2019 1443   GLUCOSE 77 05/12/2014 1343   BUN 9 04/16/2019 1443   BUN 9 05/12/2014 1343   CREATININE 0.85 04/16/2019 1443   CREATININE 0.89 05/12/2014 1343   CALCIUM 9.0 04/16/2019 1443   CALCIUM 8.8 (L) 05/12/2014 1343   PROT 6.6 04/16/2019 1443   PROT 5.9 (L) 02/17/2014 1405   ALBUMIN 4.0 04/16/2019 1443   ALBUMIN 2.8 (L) 02/17/2014 1405   AST 17 04/16/2019 1443   AST 9 (L) 02/17/2014 1405   ALT 10 04/16/2019 1443    ALT 12 (L) 02/17/2014 1405   ALKPHOS 42 04/16/2019 1443   ALKPHOS 44 (L) 02/17/2014 1405   BILITOT 0.4 04/16/2019 1443   BILITOT  0.2 02/17/2014 1405   GFRNONAA >60 04/16/2019 1443   GFRNONAA >60 05/12/2014 1343   GFRAA >60 04/16/2019 1443   GFRAA >60 05/12/2014 1343    No results found for: SPEP, UPEP  Lab Results  Component Value Date   WBC 10.8 (H) 04/16/2019   NEUTROABS 8.3 (H) 04/16/2019   HGB 11.5 (L) 04/16/2019   HCT 34.8 (L) 04/16/2019   MCV 86.1 04/16/2019   PLT 225 04/16/2019      Chemistry      Component Value Date/Time   NA 135 04/16/2019 1443   NA 135 05/12/2014 1343   K 3.9 04/16/2019 1443   K 3.3 (L) 05/12/2014 1343   CL 100 04/16/2019 1443   CL 101 05/12/2014 1343   CO2 26 04/16/2019 1443   CO2 27 05/12/2014 1343   BUN 9 04/16/2019 1443   BUN 9 05/12/2014 1343   CREATININE 0.85 04/16/2019 1443   CREATININE 0.89 05/12/2014 1343      Component Value Date/Time   CALCIUM 9.0 04/16/2019 1443   CALCIUM 8.8 (L) 05/12/2014 1343   ALKPHOS 42 04/16/2019 1443   ALKPHOS 44 (L) 02/17/2014 1405   AST 17 04/16/2019 1443   AST 9 (L) 02/17/2014 1405   ALT 10 04/16/2019 1443   ALT 12 (L) 02/17/2014 1405   BILITOT 0.4 04/16/2019 1443   BILITOT 0.2 02/17/2014 1405       RADIOGRAPHIC STUDIES: I have personally reviewed the radiological images as listed and agreed with the findings in the report. No results found.   ASSESSMENT & PLAN:  Multiple myeloma in relapse ([place]) # MULTIPLE MYELOMA-AUG 2019 recurrent;   Oct 2019- PET- no lytic lesions noted.  Stable.   # Currently on Ninlaro-Dex [start again 01/02/2018]- PR noted; FEB 2021 0.3; Kappa light chains-slightly abnormal- 2.65. STABLE.  Continue Ninlaro-Dex weekly-would not recommend treatment break.  #Mild anemia hemoglobin 11.5; otherwise renal function calcium normal.  Monitor for now. If worse-would recommend iron studies.  # Bilateral subcentimeter lung nodules -CT scan February 2021-stable lung nodules;  follow-up CT in 12 months.  Stable  # weight loss-likely secondary to underlying COPD.  Discussed regarding evaluation with nutrition patient declined.  # COPD/smoking-related changes noted on CT scan; again counseled to quit smoking- advair/spiriva/O2-stable  # Chronic nausea-question secondary to Ninlaro.  Stable  # Chronic pain-stable 15 mgcurrently on buprenorphine pain patch once a week; pain management];Marland Kitchen  No breakthrough pain main medication.  # Neuropathy-stable-on neurontin dose to 400 mg 3 times daily;   # DISPOSITION: # follow up in 1 month- cbc/cmp/MMpanel/K-L light chain;- Dr.B   Orders Placed This Encounter  Procedures  . CBC with Differential    Standing Status:   Standing    Number of Occurrences:   12    Standing Expiration Date:   04/15/2020  . Comprehensive metabolic panel    Standing Status:   Standing    Number of Occurrences:   12    Standing Expiration Date:   04/15/2020  . Multiple Myeloma Panel (SPEP&IFE w/QIG)    Standing Status:   Standing    Number of Occurrences:   12    Standing Expiration Date:   04/15/2020  . Kappa/lambda light chains    Standing Status:   Standing    Number of Occurrences:   12    Standing Expiration Date:   04/15/2020   All questions were answered. The patient knows to call the clinic with any problems, questions or concerns.  Cammie Sickle, MD 04/16/2019 4:42 PM

## 2019-04-17 LAB — MULTIPLE MYELOMA PANEL, SERUM
Albumin SerPl Elph-Mcnc: 3.6 g/dL (ref 2.9–4.4)
Albumin/Glob SerPl: 1.4 (ref 0.7–1.7)
Alpha 1: 0.3 g/dL (ref 0.0–0.4)
Alpha2 Glob SerPl Elph-Mcnc: 0.6 g/dL (ref 0.4–1.0)
B-Globulin SerPl Elph-Mcnc: 0.9 g/dL (ref 0.7–1.3)
Gamma Glob SerPl Elph-Mcnc: 0.8 g/dL (ref 0.4–1.8)
Globulin, Total: 2.7 g/dL (ref 2.2–3.9)
IgA: 61 mg/dL — ABNORMAL LOW (ref 90–386)
IgG (Immunoglobin G), Serum: 874 mg/dL (ref 603–1613)
IgM (Immunoglobulin M), Srm: 42 mg/dL (ref 20–172)
M Protein SerPl Elph-Mcnc: 0.4 g/dL — ABNORMAL HIGH
Total Protein ELP: 6.3 g/dL (ref 6.0–8.5)

## 2019-04-17 LAB — KAPPA/LAMBDA LIGHT CHAINS
Kappa free light chain: 11.8 mg/L (ref 3.3–19.4)
Kappa, lambda light chain ratio: 1.3 (ref 0.26–1.65)
Lambda free light chains: 9.1 mg/L (ref 5.7–26.3)

## 2019-04-17 MED FILL — NINLARO 3 MG CAPS: 3 | 28 days supply | Qty: 3 | Fill #3

## 2019-04-24 ENCOUNTER — Other Ambulatory Visit: Payer: Self-pay | Admitting: Internal Medicine

## 2019-05-12 ENCOUNTER — Other Ambulatory Visit: Payer: Self-pay | Admitting: *Deleted

## 2019-05-12 MED ORDER — GABAPENTIN 400 MG PO CAPS: 400.0000 mg | ORAL_CAPSULE | Freq: Three times a day (TID) | ORAL | 1 refills | Status: DC

## 2019-05-14 ENCOUNTER — Inpatient Hospital Stay: Payer: Medicaid Other

## 2019-05-14 ENCOUNTER — Inpatient Hospital Stay: Payer: Medicaid Other | Admitting: Internal Medicine

## 2019-05-15 MED FILL — NINLARO 3 MG CAPS: 3 | 28 days supply | Qty: 3 | Fill #4

## 2019-05-20 ENCOUNTER — Other Ambulatory Visit: Payer: Self-pay

## 2019-05-20 ENCOUNTER — Inpatient Hospital Stay (HOSPITAL_BASED_OUTPATIENT_CLINIC_OR_DEPARTMENT_OTHER): Payer: Medicaid Other | Admitting: Internal Medicine

## 2019-05-20 ENCOUNTER — Inpatient Hospital Stay: Payer: Medicaid Other | Attending: Internal Medicine

## 2019-05-20 VITALS — BP 103/74 | HR 105 | Resp 20 | Wt 115.0 lb

## 2019-05-20 DIAGNOSIS — C9002 Multiple myeloma in relapse: Secondary | ICD-10-CM | POA: Insufficient documentation

## 2019-05-20 DIAGNOSIS — K219 Gastro-esophageal reflux disease without esophagitis: Secondary | ICD-10-CM | POA: Insufficient documentation

## 2019-05-20 DIAGNOSIS — Z8042 Family history of malignant neoplasm of prostate: Secondary | ICD-10-CM | POA: Insufficient documentation

## 2019-05-20 DIAGNOSIS — Z95810 Presence of automatic (implantable) cardiac defibrillator: Secondary | ICD-10-CM | POA: Insufficient documentation

## 2019-05-20 DIAGNOSIS — Z79899 Other long term (current) drug therapy: Secondary | ICD-10-CM | POA: Insufficient documentation

## 2019-05-20 DIAGNOSIS — D649 Anemia, unspecified: Secondary | ICD-10-CM | POA: Diagnosis not present

## 2019-05-20 DIAGNOSIS — F329 Major depressive disorder, single episode, unspecified: Secondary | ICD-10-CM | POA: Insufficient documentation

## 2019-05-20 DIAGNOSIS — F419 Anxiety disorder, unspecified: Secondary | ICD-10-CM | POA: Diagnosis not present

## 2019-05-20 DIAGNOSIS — Z7982 Long term (current) use of aspirin: Secondary | ICD-10-CM | POA: Insufficient documentation

## 2019-05-20 DIAGNOSIS — Z8673 Personal history of transient ischemic attack (TIA), and cerebral infarction without residual deficits: Secondary | ICD-10-CM | POA: Insufficient documentation

## 2019-05-20 DIAGNOSIS — F17210 Nicotine dependence, cigarettes, uncomplicated: Secondary | ICD-10-CM | POA: Diagnosis not present

## 2019-05-20 DIAGNOSIS — Z7951 Long term (current) use of inhaled steroids: Secondary | ICD-10-CM | POA: Insufficient documentation

## 2019-05-20 DIAGNOSIS — Z7952 Long term (current) use of systemic steroids: Secondary | ICD-10-CM | POA: Diagnosis not present

## 2019-05-20 DIAGNOSIS — Z8249 Family history of ischemic heart disease and other diseases of the circulatory system: Secondary | ICD-10-CM | POA: Diagnosis not present

## 2019-05-20 DIAGNOSIS — I509 Heart failure, unspecified: Secondary | ICD-10-CM | POA: Insufficient documentation

## 2019-05-20 DIAGNOSIS — I4891 Unspecified atrial fibrillation: Secondary | ICD-10-CM | POA: Diagnosis not present

## 2019-05-20 DIAGNOSIS — J449 Chronic obstructive pulmonary disease, unspecified: Secondary | ICD-10-CM | POA: Insufficient documentation

## 2019-05-20 DIAGNOSIS — I2510 Atherosclerotic heart disease of native coronary artery without angina pectoris: Secondary | ICD-10-CM | POA: Insufficient documentation

## 2019-05-20 DIAGNOSIS — E785 Hyperlipidemia, unspecified: Secondary | ICD-10-CM | POA: Insufficient documentation

## 2019-05-20 DIAGNOSIS — I252 Old myocardial infarction: Secondary | ICD-10-CM | POA: Insufficient documentation

## 2019-05-20 LAB — CBC WITH DIFFERENTIAL/PLATELET
Abs Immature Granulocytes: 0.03 10*3/uL (ref 0.00–0.07)
Basophils Absolute: 0 10*3/uL (ref 0.0–0.1)
Basophils Relative: 0 %
Eosinophils Absolute: 0.1 10*3/uL (ref 0.0–0.5)
Eosinophils Relative: 1 %
HCT: 42.6 % (ref 39.0–52.0)
Hemoglobin: 13.9 g/dL (ref 13.0–17.0)
Immature Granulocytes: 0 %
Lymphocytes Relative: 14 %
Lymphs Abs: 1.4 10*3/uL (ref 0.7–4.0)
MCH: 28.2 pg (ref 26.0–34.0)
MCHC: 32.6 g/dL (ref 30.0–36.0)
MCV: 86.4 fL (ref 80.0–100.0)
Monocytes Absolute: 0.9 10*3/uL (ref 0.1–1.0)
Monocytes Relative: 9 %
Neutro Abs: 7.5 10*3/uL (ref 1.7–7.7)
Neutrophils Relative %: 76 %
Platelets: 259 10*3/uL (ref 150–400)
RBC: 4.93 MIL/uL (ref 4.22–5.81)
RDW: 14.4 % (ref 11.5–15.5)
WBC: 10 10*3/uL (ref 4.0–10.5)
nRBC: 0 % (ref 0.0–0.2)

## 2019-05-20 LAB — COMPREHENSIVE METABOLIC PANEL
ALT: 10 U/L (ref 0–44)
AST: 14 U/L — ABNORMAL LOW (ref 15–41)
Albumin: 3.9 g/dL (ref 3.5–5.0)
Alkaline Phosphatase: 45 U/L (ref 38–126)
Anion gap: 9 (ref 5–15)
BUN: 7 mg/dL (ref 6–20)
CO2: 27 mmol/L (ref 22–32)
Calcium: 8.8 mg/dL — ABNORMAL LOW (ref 8.9–10.3)
Chloride: 102 mmol/L (ref 98–111)
Creatinine, Ser: 0.76 mg/dL (ref 0.61–1.24)
GFR calc Af Amer: >60 mL/min (ref >60)
GFR calc non Af Amer: >60 mL/min (ref >60)
Glucose, Bld: 109 mg/dL — ABNORMAL HIGH (ref 70–99)
Potassium: 3.8 mmol/L (ref 3.5–5.1)
Sodium: 138 mmol/L (ref 135–145)
Total Bilirubin: 0.6 mg/dL (ref 0.3–1.2)
Total Protein: 7 g/dL (ref 6.5–8.1)

## 2019-05-20 MED ORDER — GABAPENTIN 400 MG PO CAPS: ORAL_CAPSULE | ORAL | 0 refills | Status: DC

## 2019-05-20 NOTE — Progress Notes (Signed)
[place] OFFICE PROGRESS NOTE  Patient Care Team: Patient, No Pcp Per as PCP - General (General Practice)  Cancer Staging No matching staging information was found for the patient.   Oncology History Overview Note  # SEP 2015- MULTIPLE MYELOMA [multiple PET pos Bone lesions; hypercalcemia s/p BMBx; FISH- Aneuploidy - gain of chromosome 7,9,15,FGFR3/4p16.3, and CCND1/11q13. Loss of MAF/16q23.1), cytogenetics normal 46XY] s/p Vel-Dex-Rev-Zometa; Excellent PR;   # MARCH 2016- REV-DEX ; FEB 2017-[discn Dex] Rev 25 mg 3 w On & 1 w Off; March 2017- M- protein 0.4gm/dl; K/L= 7.9; cont Rev;   # Revlimid HELD June 2018 [sec to ONJ]  # AUG 2019- RECURRENCE- NINLARO-DEX  # March 2017-  Left Middle Lobe cavitary lesion- ~43m- repeat Ct in 3-412m# AUG 2017- ONJ [[name], [place] #  ? ONJ- [phone] not follow up ]- DISCONT Zometa.; NO HBO sec to   # Chronic pain/Anxiety [ortho]  # COPD/smoking/ [[facility]- not candidate for BMT- sec to co-morbidities/poor nutritional status];    # MARCH 2020-PAIN CONTRACT: BREACH-  DECLINE ANY NARCOTICS/BENZO  # PALLIATIVE CARE: [name] [2020]  ---------------------------------------------------------------  DIAGNOSIS: MULTIPLE MYELOMA  STAGE: RECURRENT  ;GOALS: CONTROL  CURRENT/MOST RECENT THERAPY; AUG 2019- NINLARO-DEX    [place]     INTERVAL HISTORY:  [name] [age].o.  male pleasant patient above history of recurrent/relapse multiple myeloma-currently on Ninlaro-dex is here for follow-up.  Patient complains of worsening tingling and numbness in his extremities.  Complains of pain in his lower extremities especially with exertion.  He feels current dose of gabapentin not helping.  Patient complains of chronic nausea without any vomiting.  Continues to have chronic pain for which he is on buprenorphine pain patch.   Review of Systems  Constitutional: Positive for malaise/fatigue. Negative for  chills, diaphoresis and fever.  HENT: Negative for nosebleeds and sore throat.   Eyes: Negative for double vision.  Respiratory: Positive for cough and shortness of breath. Negative for hemoptysis and sputum production.   Cardiovascular: Negative for chest pain, palpitations, orthopnea and leg swelling.  Gastrointestinal: Negative for abdominal pain, blood in stool, constipation, diarrhea, heartburn, melena and vomiting.  Genitourinary: Negative for dysuria, frequency and urgency.  Musculoskeletal: Positive for back pain and joint pain.  Skin: Negative.  Negative for itching and rash.  Neurological: Positive for tingling. Negative for dizziness, focal weakness, weakness and headaches.  Endo/Heme/Allergies: Does not bruise/bleed easily.  Psychiatric/Behavioral: Negative for depression. The patient is not nervous/anxious and does not have insomnia.       PAST MEDICAL HISTORY :  Past Medical History:  Diagnosis Date  . Anxiety   . Arthritis   . Atrial septal defect   . CHF (congestive heart failure) ([facility]  . COPD (chronic obstructive pulmonary disease) (HCC)    on 3L O2 - continuous  . Coronary artery disease   . CVA (cerebral vascular accident) ([facility]/[place]   "mild" - no deficits  . Depression   . Dyspnea    with exertion  . Dysrhythmia    nonsustained VT, Afib  . Dysrhythmias   . Environmental and seasonal allergies   . GERD (gastroesophageal reflux disease)   . Hyperlipidemia   . ICD (implantable cardioverter-defibrillator) in place [facility]22/2011   [facility]. Maln[facility]ished ([facility]  . Multiple myeloma (HCC)    multiple myeloma  . Myocardial infarction ([facility]  . Neuropathy    feet, legs, hands  .  Presence of permanent cardiac pacemaker    defib pacemaker  . Substance abuse ([place])     PAST SURGICAL HISTORY :   Past Surgical History:  Procedure Laterality Date  . CARDIAC DEFIBRILLATOR PLACEMENT  08/22/2011   [employer] single chamber placed  by Dr Marchia Meiers, [place]  . CATARACT EXTRACTION W/PHACO Right 12/13/2018   Procedure: CATARACT EXTRACTION PHACO AND INTRAOCULAR LENS PLACEMENT ([place]) RIGHT;  Surgeon: Birder Robson, MD;  Location: [facility] ORS;  Service: Ophthalmology;  Laterality: Right;  [place] 01:21.4 CDE 15.56 FLUID PACK LOT # [id number]  . CATARACT EXTRACTION W/PHACO Left 01/23/2019   Procedure: CATARACT EXTRACTION PHACO AND INTRAOCULAR LENS PLACEMENT ([place]) LEFT;  Surgeon: Birder Robson, MD;  Location: [facility] ORS;  Service: Ophthalmology;  Laterality: Left;  [place] 01:13.5 CDE 13.33 Fluid Pack lot # [id number]  . TOOTH EXTRACTION      FAMILY HISTORY :   Family History  Problem Relation Age of Onset  . COPD Mother   . CAD Mother   . Heart attack Father   . Prostate cancer Maternal Grandfather   . Kidney cancer Neg Hx   . Bladder Cancer Neg Hx     SOCIAL HISTORY:   Social History   Tobacco Use  . Smoking status: Current Every Day Smoker    Packs/day: 0.50    Years: 40.00    Pack years: 20.00    Types: Cigarettes  . Smokeless tobacco: Never Used  Substance Use Topics  . Alcohol use: No    Alcohol/week: 0.0 standard drinks  . Drug use: No    ALLERGIES:  has No Known Allergies.  MEDICATIONS:  Current Outpatient Medications  Medication Sig Dispense Refill  . acetaminophen (TYLENOL) 325 MG tablet Take 2 tablets (650 mg total) by mouth every 6 (six) hours as needed for mild pain (or Fever >/= 101). (Patient taking differently: Take 650 mg by mouth every 6 (six) hours as needed for mild pain (or Fever >/= 101). )    . aspirin EC 81 MG EC tablet Take 1 tablet (81 mg total) by mouth daily.    . Buprenorphine (BUTRANS) 15 MCG/HR PTWK Place 1 patch onto the skin once a week.    Marland Kitchen dexamethasone (DECADRON) 4 MG tablet TAKE THREE TABLETS BY MOUTH ONCE A WEEK IN MORNING WITH BREAKFAST 30 tablet 4  . DULoxetine (CYMBALTA) 30 MG capsule TAKE ONE CAPSULE BY MOUTH ONCE DAILY 90 capsule 3  . Fluticasone-Salmeterol (ADVAIR  DISKUS) 250-50 MCG/DOSE AEPB Inhale 1 puff into the lungs 2 (two) times daily. 3 each 2  . gabapentin (NEURONTIN) 400 MG capsule Take 2 pills in AM; 2 pills in PM. 1 pill in afternoon. 150 capsule 0  . ipratropium-albuterol (DUONEB) 0.5-2.5 (3) MG/3ML SOLN Take 3 mLs by nebulization every 4 (four) hours as needed. (Patient taking differently: Take 3 mLs by nebulization every 4 (four) hours as needed. Asthma) 360 mL 3  . ixazomib citrate (NINLARO) 3 MG capsule Take 1 capsule (3 mg total) by mouth every Wednesday. Take for 3 weeks on, then 1 week off. Take on an empty stomach 1hr before or 2hrs after food 3 capsule 4  . metoprolol tartrate (LOPRESSOR) 50 MG tablet Take 50 mg by mouth daily.     . naloxone (NARCAN) nasal spray 4 mg/0.1 mL For Opioid Overdose: Call 911 and administer a single spray of Narcan in one nostril. Repeat every 34mns as needed if no or minimal response 1 kit 2  . ondansetron (ZOFRAN) 8 MG tablet  TAKE ONE TABLET BY MOUTH EVERY 8 HOURS AS NEEDED (Patient taking differently: Take 8 mg by mouth every 8 (eight) hours as needed for nausea or vomiting. ) 60 tablet 3  . OXYGEN Inhale 3 L into the lungs continuous.    . polyethylene glycol powder (GLYCOLAX/MIRALAX) 17 GM/SCOOP powder Take 17 g by mouth daily.    . potassium chloride SA (KLOR-CON) 20 MEQ tablet Take 1 tablet by mouth daily.    Marland Kitchen PROAIR HFA 108 (90 Base) MCG/ACT inhaler INHALE 2 PUFFS BY MOUTH INTO THE LUNGS EVERY 6 HOURS AS NEEDED FOR WHEEZING OR SHORTNESS OF BREATH (Patient taking differently: Inhale 2 puffs into the lungs every 6 (six) hours as needed for wheezing or shortness of breath. ) 8.5 g 6  . tiotropium (SPIRIVA HANDIHALER) 18 MCG inhalation capsule Place 1 capsule (18 mcg total) into inhaler and inhale daily. 30 capsule 6   No current facility-administered medications for this visit.    PHYSICAL EXAMINATION: ECOG PERFORMANCE STATUS: 2 - Symptomatic, <50% confined to bed  BP 103/74   Pulse (!) 105   Resp 20    Wt 115 lb (52.2 kg)   BMI 15.60 kg/m   Filed Weights   05/20/19 1459  Weight: 115 lb (52.2 kg)    Physical Exam  Constitutional: He is oriented to person, place, and time.  Cachectic appearing Caucasian male patient; on room air.  He is alone.  He is walking by himself.  HENT:  Head: Normocephalic and atraumatic.  Mouth/Throat: Oropharynx is clear and moist. No oropharyngeal exudate.  Eyes: Pupils are equal, round, and reactive to light.  Cardiovascular: Normal rate and regular rhythm.  Pulmonary/Chest: No respiratory distress.  Bilateral decreased air entry.  Abdominal: Soft. Bowel sounds are normal. He exhibits no distension and no mass. There is no abdominal tenderness. There is no rebound and no guarding.  Musculoskeletal:        General: No tenderness or edema. Normal range of motion.     Cervical back: Normal range of motion and neck supple.  Neurological: He is alert and oriented to person, place, and time.  Skin: Skin is warm.  Psychiatric: Affect normal.       LABORATORY DATA:  I have reviewed the data as listed    Component Value Date/Time   NA 138 05/20/2019 1436   NA 135 05/12/2014 1343   K 3.8 05/20/2019 1436   K 3.3 (L) 05/12/2014 1343   CL 102 05/20/2019 1436   CL 101 05/12/2014 1343   CO2 27 05/20/2019 1436   CO2 27 05/12/2014 1343   GLUCOSE 109 (H) 05/20/2019 1436   GLUCOSE 77 05/12/2014 1343   BUN 7 05/20/2019 1436   BUN 9 05/12/2014 1343   CREATININE 0.76 05/20/2019 1436   CREATININE 0.89 05/12/2014 1343   CALCIUM 8.8 (L) 05/20/2019 1436   CALCIUM 8.8 (L) 05/12/2014 1343   PROT 7.0 05/20/2019 1436   PROT 5.9 (L) 02/17/2014 1405   ALBUMIN 3.9 05/20/2019 1436   ALBUMIN 2.8 (L) 02/17/2014 1405   AST 14 (L) 05/20/2019 1436   AST 9 (L) 02/17/2014 1405   ALT 10 05/20/2019 1436   ALT 12 (L) 02/17/2014 1405   ALKPHOS 45 05/20/2019 1436   ALKPHOS 44 (L) 02/17/2014 1405   BILITOT 0.6 05/20/2019 1436   BILITOT 0.2 02/17/2014 1405   GFRNONAA  >60 05/20/2019 1436   GFRNONAA >60 05/12/2014 1343   GFRAA >60 05/20/2019 1436   GFRAA >60 05/12/2014 1343  No results found for: SPEP, UPEP  Lab Results  Component Value Date   WBC 10.0 05/20/2019   NEUTROABS 7.5 05/20/2019   HGB 13.9 05/20/2019   HCT 42.6 05/20/2019   MCV 86.4 05/20/2019   PLT 259 05/20/2019      Chemistry      Component Value Date/Time   NA 138 05/20/2019 1436   NA 135 05/12/2014 1343   K 3.8 05/20/2019 1436   K 3.3 (L) 05/12/2014 1343   CL 102 05/20/2019 1436   CL 101 05/12/2014 1343   CO2 27 05/20/2019 1436   CO2 27 05/12/2014 1343   BUN 7 05/20/2019 1436   BUN 9 05/12/2014 1343   CREATININE 0.76 05/20/2019 1436   CREATININE 0.89 05/12/2014 1343      Component Value Date/Time   CALCIUM 8.8 (L) 05/20/2019 1436   CALCIUM 8.8 (L) 05/12/2014 1343   ALKPHOS 45 05/20/2019 1436   ALKPHOS 44 (L) 02/17/2014 1405   AST 14 (L) 05/20/2019 1436   AST 9 (L) 02/17/2014 1405   ALT 10 05/20/2019 1436   ALT 12 (L) 02/17/2014 1405   BILITOT 0.6 05/20/2019 1436   BILITOT 0.2 02/17/2014 1405       RADIOGRAPHIC STUDIES: I have personally reviewed the radiological images as listed and agreed with the findings in the report. No results found.   ASSESSMENT & PLAN:  Multiple myeloma in relapse ([place]) # MULTIPLE MYELOMA-AUG 2019 recurrent;   Oct 2019- PET- no lytic lesions noted.  Clinically stable.  # Currently on Ninlaro-Dex [start again 01/02/2018]- PR noted; April -2021 0.4; Kappa light chains-slightly abnormal- 2.65.  Stable.  Continue Ninlaro-Dex weekly-would not recommend treatment break.  #Mild anemia - improved- hemoglobin 13.9  otherwise renal function calcium normal.  Monitor for now.  # Bilateral subcentimeter lung nodules -CT scan February 2021-stable lung nodules; follow-up CT in 12 months. STABLE.   # COPD/smoking-related changes noted on CT scan; advair/spiriva/O2-stable  # Chronic nausea-question secondary to Ninlaro.  Able  # Chronic  pain-stable 15 mgcurrently on buprenorphine pain patch once a week; pain management];Marland Kitchen  No breakthrough pain main medication.  # Neuropathy-worse- ?-Increase 2 pill BID; 1 in AF; recommend evaluation with neurology.  # DISPOSITION: # follow up in 1 month-MD cbc/cmp/MMpanel/K-L light chain;- Dr.B   Orders Placed This Encounter  Procedures  . CBC with Differential    Standing Status:   Future    Standing Expiration Date:   05/19/2020  . Comprehensive metabolic panel    Standing Status:   Future    Standing Expiration Date:   05/19/2020  . Multiple Myeloma Panel (SPEP&IFE w/QIG)    Standing Status:   Future    Standing Expiration Date:   05/19/2020  . Kappa/lambda light chains    Standing Status:   Future    Standing Expiration Date:   05/19/2020   All questions were answered. The patient knows to call the clinic with any problems, questions or concerns.      Cammie Sickle, MD 05/27/2019 12:39 AM

## 2019-05-20 NOTE — Assessment & Plan Note (Addendum)
#  MULTIPLE MYELOMA-AUG 2019 recurrent;   Oct 2019- PET- no lytic lesions noted.  Clinically stable.  # Currently on Ninlaro-Dex [start again 01/02/2018]- PR noted; April -2021 0.4; Kappa light chains-slightly abnormal- 2.65.  Stable.  Continue Ninlaro-Dex weekly-would not recommend treatment break.  #Mild anemia - improved- hemoglobin 13.9  otherwise renal function calcium normal.  Monitor for now.  # Bilateral subcentimeter lung nodules -CT scan February 2021-stable lung nodules; follow-up CT in 12 months. STABLE.   # COPD/smoking-related changes noted on CT scan; advair/spiriva/O2-stable  # Chronic nausea-question secondary to Ninlaro.  Able  # Chronic pain-stable 15 mgcurrently on buprenorphine pain patch once a week; pain management];Marland Kitchen  No breakthrough pain main medication.  # Neuropathy-worse- ?-Increase 2 pill BID; 1 in AF; recommend evaluation with neurology.  # DISPOSITION: # follow up in 1 month-MD cbc/cmp/MMpanel/K-L light chain;- Dr.B

## 2019-05-20 NOTE — Progress Notes (Signed)
Patient here today for follow up regarding multiple myeloma. Patient reports pain in his left side and lower back. Patient also reports worsening neuropathy in his feet that is affecting his balance and walking at this time.

## 2019-05-21 LAB — KAPPA/LAMBDA LIGHT CHAINS
Kappa free light chain: 15.6 mg/L (ref 3.3–19.4)
Kappa, lambda light chain ratio: 2.26 — ABNORMAL HIGH (ref 0.26–1.65)
Lambda free light chains: 6.9 mg/L (ref 5.7–26.3)

## 2019-05-22 ENCOUNTER — Telehealth: Payer: Self-pay | Admitting: *Deleted

## 2019-05-22 ENCOUNTER — Other Ambulatory Visit: Payer: Self-pay | Admitting: *Deleted

## 2019-05-22 DIAGNOSIS — C801 Malignant (primary) neoplasm, unspecified: Secondary | ICD-10-CM

## 2019-05-22 NOTE — Telephone Encounter (Signed)
Spoke with patient and his wife regarding the recommendation for neurology referral to further evaluate the neuropathy concerns. Patient agreeable to see neurology at [facility] clinic.  Referral initiated and faxed to Dr. Manuella Ghazi.

## 2019-05-23 LAB — MULTIPLE MYELOMA PANEL, SERUM
Albumin SerPl Elph-Mcnc: 3.5 g/dL (ref 2.9–4.4)
Albumin/Glob SerPl: 1.3 (ref 0.7–1.7)
Alpha 1: 0.3 g/dL (ref 0.0–0.4)
Alpha2 Glob SerPl Elph-Mcnc: 0.7 g/dL (ref 0.4–1.0)
B-Globulin SerPl Elph-Mcnc: 0.9 g/dL (ref 0.7–1.3)
Gamma Glob SerPl Elph-Mcnc: 0.8 g/dL (ref 0.4–1.8)
Globulin, Total: 2.7 g/dL (ref 2.2–3.9)
IgA: 65 mg/dL — ABNORMAL LOW (ref 90–386)
IgG (Immunoglobin G), Serum: 855 mg/dL (ref 603–1613)
IgM (Immunoglobulin M), Srm: 50 mg/dL (ref 20–172)
M Protein SerPl Elph-Mcnc: 0.4 g/dL — ABNORMAL HIGH
Total Protein ELP: 6.2 g/dL (ref 6.0–8.5)

## 2019-06-04 ENCOUNTER — Telehealth: Payer: Self-pay | Admitting: Internal Medicine

## 2019-06-04 NOTE — Telephone Encounter (Signed)
Message sent to [employer] again about this POC order since they have not heard anything from [employer] and order was placed 03/25/2019

## 2019-06-05 NOTE — Telephone Encounter (Signed)
LMOVM for pt or pt's wife to return my call. Rhonda J Cobb

## 2019-06-05 NOTE — Telephone Encounter (Signed)
Spoke with patient and advised that RT Dept from [place] will reach out to schedule Evaluation.  Pt is unable to carry around the tanks that he currently has.  Advised patient that if he hasn't heard back from [employer] in a week to return my call. Nothing else needed at this time. Rhonda J Cobb

## 2019-06-05 NOTE — Telephone Encounter (Signed)
Message was sent to [employer] on 03/25/2019 and then again on 03/27/2019. LMOVM for Melissa to check on status of this order and return my call. Rhonda J Cobb

## 2019-06-05 NOTE — Telephone Encounter (Signed)
Spoke to [employer] with [employer]. Pt has 02 but not a POC. POC are out of stock at the present time.  Darlina Guys will get with the RT dept and have them contact patient to schedule an RT Eval. Catha Gosselin

## 2019-06-05 NOTE — Telephone Encounter (Signed)
I received message back from [place] with [employer] see below  [name], [name], [name]; Verl Blalock; 1 other   Christian Mate!   This order was never sent to [place]. I am pulling everything now to send to our RT team.   Thanks!

## 2019-06-05 NOTE — Telephone Encounter (Signed)
I sent an individual message to [facility] with [employer] to check on this issue see notes from referral below  General 03/27/2019 10:37 AM Cobb, Carlos Levering - -  Note   Elon Alas  [place], Carlos Levering; [place], [place]; Loree Fee, I don't see anything recent. I will get this pulled and processed.  Sonia Baller  Confirmation received via CM from Dimas Chyle with [place]. Rhonda J Cobb         . Type Date User Summary Attachment  General 03/27/2019 9:14 AM Cobb, Carlos Levering - -  Note   CM sent to Darlina Guys and Dimas Chyle with [employer] checking on status of referral. Rhonda J Cobb         . Type Date User Summary Attachment  General 03/25/2019 11:39 AM Cobb, Carlos Levering - -  Note   CM sent to Darlina Guys and Dimas Chyle with [employer]. Rhonda J Cobb

## 2019-06-16 ENCOUNTER — Other Ambulatory Visit: Payer: Self-pay | Admitting: Internal Medicine

## 2019-06-16 DIAGNOSIS — C9002 Multiple myeloma in relapse: Secondary | ICD-10-CM

## 2019-06-16 MED FILL — NINLARO 3 MG CAPS: 3 | 28 days supply | Qty: 3 | Fill #0

## 2019-06-16 NOTE — Telephone Encounter (Signed)
CBC with Differential Order: [id number] Status:  Final result  Visible to patient:  Yes (MyChart)  Next appt:  07/15/2019 at 01:30 PM in Pulmonology Lauraine Rinne, NP)  Dx:  Multiple myeloma in relapse ([place])  Ref Range & Units 3 wk ago  WBC 4.0 - 10.5 K/uL 10.0   RBC 4.22 - 5.81 MIL/uL 4.93   Hemoglobin 13.0 - 17.0 g/dL 13.9   HCT 39.0 - 52.0 % 42.6   MCV 80.0 - 100.0 fL 86.4   MCH 26.0 - 34.0 pg 28.2   MCHC 30.0 - 36.0 g/dL 32.6   RDW 11.5 - 15.5 % 14.4   Platelets 150 - 400 K/uL 259   nRBC 0.0 - 0.2 % 0.0   Neutrophils Relative % % 76   Neutro Abs 1.7 - 7.7 K/uL 7.5   Lymphocytes Relative % 14   Lymphs Abs 0.7 - 4.0 K/uL 1.4   Monocytes Relative % 9   Monocytes Absolute 0.1 - 1.0 K/uL 0.9   Eosinophils Relative % 1   Eosinophils Absolute 0.0 - 0.5 K/uL 0.1   Basophils Relative % 0   Basophils Absolute 0.0 - 0.1 K/uL 0.0   Immature Granulocytes % 0   Abs Immature Granulocytes 0.00 - 0.07 K/uL 0.03   Comment: Performed at [facility], [address] [place], [place] [address]  Resulting Agency  [facility] CLIN LAB      Specimen Collected: 05/20/19 14:36 Last Resulted: 05/20/19 15:07     Lab Flowsheet   Order Details   View Encounter   Lab and Collection Details   Routing   Result History         Other Results from 05/20/2019  Contains abnormal data Kappa/lambda light chains  Status:  Final result  Visible to patient:  Yes (MyChart)  Next appt:  07/15/2019 at 01:30 PM in Pulmonology Lauraine Rinne, NP)  Dx:  Multiple myeloma in relapse [facility]) Order: [id number]  Ref Range & Units 3 wk ago  Kappa free light chain 3.3 - 19.4 mg/L 15.6   Lamda free light chains 5.7 - 26.3 mg/L 6.9   Kappa, lamda light chain ratio 0.26 - 1.65 2.26High    Comment: (NOTE)  Performed At: [facility]  [place], [place] [id number]  Rush Farmer MD HU:[id number]   Resulting Agency  [facility] CLIN LAB      Specimen Collected: 05/20/19 14:36 Last Resulted:  05/21/19 15:36     Lab Flowsheet   Order Details   View Encounter   Lab and Collection Details   Routing   Result History           Contains abnormal data Multiple Myeloma Panel (SPEP&IFE w/QIG)  Status:  Edited Result - FINAL  Visible to patient:  Yes (MyChart)  Next appt:  07/15/2019 at 01:30 PM in Pulmonology Lauraine Rinne, NP)  Dx:  Multiple myeloma in relapse [facility]) Order: [id number]  Ref Range & Units 3 wk ago  IgG (Immunoglobin G), Serum 603 - 1,613 mg/dL 855   IgA 90 - 386 mg/dL 65Low    IgM (Immunoglobulin M), Srm 20 - 172 mg/dL 50   Total Protein ELP 6.0 - 8.5 g/dL 6.2 VC   Albumin SerPl Elph-Mcnc 2.9 - 4.4 g/dL 3.5 VC   Alpha 1 0.0 - 0.4 g/dL 0.3 VC   Alpha2 Glob SerPl Elph-Mcnc 0.4 - 1.0 g/dL 0.7 VC   B-Globulin SerPl Elph-Mcnc 0.7 - 1.3 g/dL 0.9 VC   Gamma Glob SerPl  Elph-Mcnc 0.4 - 1.8 g/dL 0.8 VC   M Protein SerPl Elph-Mcnc Not Observed g/dL 0.4High  VC   Globulin, Total 2.2 - 3.9 g/dL 2.7 VC   Albumin/Glob SerPl 0.7 - 1.7 1.3 VC   IFE 1  CommentAbnormal  VC   Comment: (NOTE)  Immunofixation shows IgG monoclonal protein with kappa light chain  specificity.  Please note that samples from patients receiving DARZALEX(R)  (daratumumab) treatment can appear as an "IgG kappa" and mask a  complete response. If this patient is receiving DARA, this IFE  assay interference can be removed by ordering test number  [id number]-"Immunofixation, Daratumumab-Specific, Serum" and  submitting a new sample for testing or by calling the lab to add  this test to the current sample.   Please Note  Comment VC   Comment: (NOTE)  Protein electrophoresis scan will follow via computer, mail, or  courier delivery.  Performed At: [facility]  [place], [place] [id number]  Rush Farmer MD EY:[id number]   Resulting Agency  [facility] CLIN LAB      Specimen Collected: 05/20/19 14:36 Last Resulted: 05/23/19 11:37     Lab Flowsheet   Order Details   View  Encounter   Lab and Collection Details   Routing   Result History     VC=Value has a corrected status         Contains abnormal data Comprehensive metabolic panel  Status:  Final result  Visible to patient:  Yes (MyChart)  Next appt:  07/15/2019 at 01:30 PM in Pulmonology Lauraine Rinne, NP)  Dx:  Multiple myeloma in relapse [facility]) Order: [id number]  Ref Range & Units 3 wk ago  Sodium 135 - 145 mmol/L 138   Potassium 3.5 - 5.1 mmol/L 3.8   Chloride 98 - 111 mmol/L 102   CO2 22 - 32 mmol/L 27   Glucose, Bld 70 - 99 mg/dL 109High    Comment: Glucose reference range applies only to samples taken after fasting for at least 8 hours.  BUN 6 - 20 mg/dL 7   Creatinine, Ser 0.61 - 1.24 mg/dL 0.76   Calcium 8.9 - 10.3 mg/dL 8.8Low    Total Protein 6.5 - 8.1 g/dL 7.0   Albumin 3.5 - 5.0 g/dL 3.9   AST 15 - 41 U/L 14Low    ALT 0 - 44 U/L 10   Alkaline Phosphatase 38 - 126 U/L 45   Total Bilirubin 0.3 - 1.2 mg/dL 0.6   GFR calc non Af Amer >60 mL/min >60   GFR calc Af Amer >60 mL/min >60   Anion gap 5 - 15 9   Comment: Performed at [facility], [address] [place], [place] [address]  Resulting Agency  [facility] CLIN LAB      Specimen Collected: 05/20/19 14:36 Last Resulted: 05/20/19 15:25

## 2019-06-27 ENCOUNTER — Encounter: Payer: Self-pay | Admitting: Internal Medicine

## 2019-06-27 ENCOUNTER — Telehealth: Payer: Self-pay | Admitting: *Deleted

## 2019-06-27 ENCOUNTER — Other Ambulatory Visit: Payer: Self-pay

## 2019-06-27 MED ORDER — GABAPENTIN 400 MG PO CAPS: ORAL_CAPSULE | ORAL | 2 refills | Status: DC

## 2019-06-27 NOTE — Telephone Encounter (Signed)
Patient has an apt on Monday with Dr. B; however, need RF on gabapentin.

## 2019-06-30 ENCOUNTER — Inpatient Hospital Stay (HOSPITAL_BASED_OUTPATIENT_CLINIC_OR_DEPARTMENT_OTHER): Payer: Medicaid Other | Admitting: Internal Medicine

## 2019-06-30 ENCOUNTER — Inpatient Hospital Stay: Payer: Medicaid Other | Attending: Internal Medicine

## 2019-06-30 ENCOUNTER — Other Ambulatory Visit: Payer: Self-pay

## 2019-06-30 ENCOUNTER — Encounter: Payer: Self-pay | Admitting: Internal Medicine

## 2019-06-30 DIAGNOSIS — F419 Anxiety disorder, unspecified: Secondary | ICD-10-CM | POA: Insufficient documentation

## 2019-06-30 DIAGNOSIS — Z7982 Long term (current) use of aspirin: Secondary | ICD-10-CM | POA: Diagnosis not present

## 2019-06-30 DIAGNOSIS — Z7951 Long term (current) use of inhaled steroids: Secondary | ICD-10-CM | POA: Insufficient documentation

## 2019-06-30 DIAGNOSIS — Z8673 Personal history of transient ischemic attack (TIA), and cerebral infarction without residual deficits: Secondary | ICD-10-CM | POA: Diagnosis not present

## 2019-06-30 DIAGNOSIS — Z79899 Other long term (current) drug therapy: Secondary | ICD-10-CM | POA: Diagnosis not present

## 2019-06-30 DIAGNOSIS — J449 Chronic obstructive pulmonary disease, unspecified: Secondary | ICD-10-CM | POA: Insufficient documentation

## 2019-06-30 DIAGNOSIS — C9002 Multiple myeloma in relapse: Secondary | ICD-10-CM

## 2019-06-30 DIAGNOSIS — F17210 Nicotine dependence, cigarettes, uncomplicated: Secondary | ICD-10-CM | POA: Diagnosis not present

## 2019-06-30 DIAGNOSIS — Z7952 Long term (current) use of systemic steroids: Secondary | ICD-10-CM | POA: Insufficient documentation

## 2019-06-30 DIAGNOSIS — I4891 Unspecified atrial fibrillation: Secondary | ICD-10-CM | POA: Insufficient documentation

## 2019-06-30 DIAGNOSIS — Z8249 Family history of ischemic heart disease and other diseases of the circulatory system: Secondary | ICD-10-CM | POA: Diagnosis not present

## 2019-06-30 DIAGNOSIS — Z95810 Presence of automatic (implantable) cardiac defibrillator: Secondary | ICD-10-CM | POA: Insufficient documentation

## 2019-06-30 DIAGNOSIS — I509 Heart failure, unspecified: Secondary | ICD-10-CM | POA: Insufficient documentation

## 2019-06-30 DIAGNOSIS — N529 Male erectile dysfunction, unspecified: Secondary | ICD-10-CM | POA: Diagnosis not present

## 2019-06-30 DIAGNOSIS — I252 Old myocardial infarction: Secondary | ICD-10-CM | POA: Insufficient documentation

## 2019-06-30 DIAGNOSIS — Z8042 Family history of malignant neoplasm of prostate: Secondary | ICD-10-CM | POA: Insufficient documentation

## 2019-06-30 DIAGNOSIS — E785 Hyperlipidemia, unspecified: Secondary | ICD-10-CM | POA: Insufficient documentation

## 2019-06-30 DIAGNOSIS — F329 Major depressive disorder, single episode, unspecified: Secondary | ICD-10-CM | POA: Diagnosis not present

## 2019-06-30 LAB — COMPREHENSIVE METABOLIC PANEL
ALT: 11 U/L (ref 0–44)
AST: 15 U/L (ref 15–41)
Albumin: 3.5 g/dL (ref 3.5–5.0)
Alkaline Phosphatase: 49 U/L (ref 38–126)
Anion gap: 9 (ref 5–15)
BUN: 10 mg/dL (ref 6–20)
CO2: 31 mmol/L (ref 22–32)
Calcium: 9.3 mg/dL (ref 8.9–10.3)
Chloride: 100 mmol/L (ref 98–111)
Creatinine, Ser: 0.76 mg/dL (ref 0.61–1.24)
GFR calc Af Amer: >60 mL/min (ref >60)
GFR calc non Af Amer: >60 mL/min (ref >60)
Glucose, Bld: 97 mg/dL (ref 70–99)
Potassium: 3.7 mmol/L (ref 3.5–5.1)
Sodium: 140 mmol/L (ref 135–145)
Total Bilirubin: 0.4 mg/dL (ref 0.3–1.2)
Total Protein: 6.8 g/dL (ref 6.5–8.1)

## 2019-06-30 LAB — CBC WITH DIFFERENTIAL/PLATELET
Abs Immature Granulocytes: 0.04 10*3/uL (ref 0.00–0.07)
Basophils Absolute: 0 10*3/uL (ref 0.0–0.1)
Basophils Relative: 0 %
Eosinophils Absolute: 0.2 10*3/uL (ref 0.0–0.5)
Eosinophils Relative: 2 %
HCT: 40.2 % (ref 39.0–52.0)
Hemoglobin: 13.2 g/dL (ref 13.0–17.0)
Immature Granulocytes: 0 %
Lymphocytes Relative: 9 %
Lymphs Abs: 1.2 10*3/uL (ref 0.7–4.0)
MCH: 28.1 pg (ref 26.0–34.0)
MCHC: 32.8 g/dL (ref 30.0–36.0)
MCV: 85.7 fL (ref 80.0–100.0)
Monocytes Absolute: 1.3 10*3/uL — ABNORMAL HIGH (ref 0.1–1.0)
Monocytes Relative: 10 %
Neutro Abs: 10.7 10*3/uL — ABNORMAL HIGH (ref 1.7–7.7)
Neutrophils Relative %: 79 %
Platelets: 239 10*3/uL (ref 150–400)
RBC: 4.69 MIL/uL (ref 4.22–5.81)
RDW: 14.9 % (ref 11.5–15.5)
WBC: 13.5 10*3/uL — ABNORMAL HIGH (ref 4.0–10.5)
nRBC: 0 % (ref 0.0–0.2)

## 2019-06-30 MED ORDER — SERTRALINE HCL 50 MG PO TABS
50.0000 mg | ORAL_TABLET | Freq: Every day | ORAL | 6 refills | Status: DC
Start: 2019-06-30 — End: 2019-12-26

## 2019-06-30 NOTE — Assessment & Plan Note (Addendum)
#  MULTIPLE MYELOMA-AUG 2019 recurrent;   Oct 2019- PET- no lytic lesions noted.  Clinically STABLE.   # Currently on Ninlaro-Dex [since 01/02/2018]- PR noted; April -2021 0.4; Kappa light chains-slightly abnormal- 2.65.  Continue Ninlaro-Dex weekly.   # Bilateral subcentimeter lung nodules -CT scan February 2021-stable lung nodules; follow-up CT in 12 months. STABLE.   # COPD/smoking-related changes noted on CT scan; advair/spiriva/O2- STABLE.   # Chronic nausea-question secondary to Ninlaro.  STABLE.   # Chronic pain-STABLE; 15 mgcurrently on buprenorphine pain patch once a week; pain management];   # Neuropathy-STABLE; Neurontin 2 pill BID; 1 in AF; recommend evaluation with neurology. Stopped cymblata sec to impotence issues.   # Depression: Recommend Zoloft.  Discussed the potential side effects.  # DISPOSITION: # follow up in 1 month-MD cbc/cmp/MMpanel/K-L light chain;- Dr.B

## 2019-06-30 NOTE — Progress Notes (Signed)
[place] OFFICE PROGRESS NOTE  Patient Care Team: Patient, No Pcp Per as PCP - General (General Practice)  Cancer Staging No matching staging information was found for the patient.   Oncology History Overview Note  # SEP 2015- MULTIPLE MYELOMA [multiple PET pos Bone lesions; hypercalcemia s/p BMBx; FISH- Aneuploidy - gain of chromosome 7,9,15,FGFR3/4p16.3, and CCND1/11q13. Loss of MAF/16q23.1), cytogenetics normal 46XY] s/p Vel-Dex-Rev-Zometa; Excellent PR;   # MARCH 2016- REV-DEX ; FEB 2017-[discn Dex] Rev 25 mg 3 w On & 1 w Off; March 2017- M- protein 0.4gm/dl; K/L= 7.9; cont Rev;   # Revlimid HELD June 2018 [sec to ONJ]  # AUG 2019- RECURRENCE- NINLARO-DEX  # March 2017-  Left Middle Lobe cavitary lesion- ~63m- repeat Ct in 3-478m# AUG 2017- ONJ [[name], [place] #  ? ONJ- [phone] not follow up ]- DISCONT Zometa.; NO HBO sec to   # Chronic pain/Anxiety [ortho]  # COPD/smoking/ [[facility]- not candidate for BMT- sec to co-morbidities/poor nutritional status];    # MARCH 2020-PAIN CONTRACT: BREACH-  DECLINE ANY NARCOTICS/BENZO  # PALLIATIVE CARE: [name] [2020]  ---------------------------------------------------------------  DIAGNOSIS: MULTIPLE MYELOMA  STAGE: RECURRENT  ;GOALS: CONTROL  CURRENT/MOST RECENT THERAPY; AUG 2019- NINLARO-DEX    Multiple [place] in relapse ([place]     INTERVAL HISTORY:  [name] 89.o.  male pleasant patient above history of recurrent/relapse multiple myeloma-currently on Ninlaro-dex is here for follow-up.  Complains of felt dysfunction; attributes to Cymbalta.  Stop Cymbalta.  He continues to have chronic pain for which he is on buprenorphine pain patch.  Is not needing any breakthrough pain medication.  Chronic nausea without any vomiting.  Chronic tingling and numbness of the extremities.  Review of Systems  Constitutional: Positive for malaise/fatigue. Negative for chills, diaphoresis and fever.   HENT: Negative for nosebleeds and sore throat.   Eyes: Negative for double vision.  Respiratory: Positive for cough and shortness of breath. Negative for hemoptysis and sputum production.   Cardiovascular: Negative for chest pain, palpitations, orthopnea and leg swelling.  Gastrointestinal: Negative for abdominal pain, blood in stool, constipation, diarrhea, heartburn, melena and vomiting.  Genitourinary: Negative for dysuria, frequency and urgency.  Musculoskeletal: Positive for back pain and joint pain.  Skin: Negative.  Negative for itching and rash.  Neurological: Positive for tingling. Negative for dizziness, focal weakness, weakness and headaches.  Endo/Heme/Allergies: Does not bruise/bleed easily.  Psychiatric/Behavioral: Negative for depression. The patient is not nervous/anxious and does not have insomnia.       PAST MEDICAL HISTORY :  Past Medical History:  Diagnosis Date  . Anxiety   . Arthritis   . Atrial septal defect   . CHF (congestive heart failure) ([facility]  . COPD (chronic obstructive pulmonary disease) (HCC)    on 3L O2 - continuous  . Coronary artery disease   . CVA (cerebral vascular accident) ([place]   "mild" - no deficits  . Depression   . Dyspnea    with exertion  . Dysrhythmia    nonsustained VT, Afib  . Dysrhythmias   . Environmental and seasonal allergies   . GERD (gastroesophageal reflux disease)   . Hyperlipidemia   . 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 Mal[facility] ([facility]  . Multiple myeloma (HCC)    multiple myeloma  . Myocardial infarction ([place]  . Neuropathy    feet, legs, hands  . Presence of permanent cardiac pacemaker  defib pacemaker  . Substance abuse ([place])     PAST SURGICAL HISTORY :   Past Surgical History:  Procedure Laterality Date  . CARDIAC DEFIBRILLATOR PLACEMENT  08/22/2011   [employer] single chamber placed by Dr Marchia Meiers, [place]   . CATARACT EXTRACTION W/PHACO Right 12/13/2018   Procedure: CATARACT EXTRACTION PHACO AND INTRAOCULAR LENS PLACEMENT ([place]) RIGHT;  Surgeon: Birder Robson, MD;  Location: [facility] ORS;  Service: Ophthalmology;  Laterality: Right;  [place] 01:21.4 CDE 15.56 FLUID PACK LOT # [id number]  . CATARACT EXTRACTION W/PHACO Left 01/23/2019   Procedure: CATARACT EXTRACTION PHACO AND INTRAOCULAR LENS PLACEMENT ([place]) LEFT;  Surgeon: Birder Robson, MD;  Location: [facility] ORS;  Service: Ophthalmology;  Laterality: Left;  [place] 01:13.5 CDE 13.33 Fluid Pack lot # [id number]  . TOOTH EXTRACTION      FAMILY HISTORY :   Family History  Problem Relation Age of Onset  . COPD Mother   . CAD Mother   . Heart attack Father   . Prostate cancer Maternal Grandfather   . Kidney cancer Neg Hx   . Bladder Cancer Neg Hx     SOCIAL HISTORY:   Social History   Tobacco Use  . Smoking status: Current Every Day Smoker    Packs/day: 0.50    Years: 40.00    Pack years: 20.00    Types: Cigarettes  . Smokeless tobacco: Never Used  Vaping Use  . Vaping Use: Never used  Substance Use Topics  . Alcohol use: No    Alcohol/week: 0.0 standard drinks  . Drug use: No    ALLERGIES:  has No Known Allergies.  MEDICATIONS:  Current Outpatient Medications  Medication Sig Dispense Refill  . acetaminophen (TYLENOL) 325 MG tablet Take 2 tablets (650 mg total) by mouth every 6 (six) hours as needed for mild pain (or Fever >/= 101). (Patient taking differently: Take 650 mg by mouth every 6 (six) hours as needed for mild pain (or Fever >/= 101). )    . aspirin EC 81 MG EC tablet Take 1 tablet (81 mg total) by mouth daily.    . Buprenorphine (BUTRANS) 15 MCG/HR PTWK Place 1 patch onto the skin once a week.    Marland Kitchen dexamethasone (DECADRON) 4 MG tablet TAKE THREE TABLETS BY MOUTH ONCE A WEEK IN MORNING WITH BREAKFAST 30 tablet 4  . gabapentin (NEURONTIN) 400 MG capsule Take 2 pills in AM; 2 pills in PM. 1 pill in afternoon. 150 capsule 2  .  ipratropium-albuterol (DUONEB) 0.5-2.5 (3) MG/3ML SOLN Take 3 mLs by nebulization every 4 (four) hours as needed. (Patient taking differently: Take 3 mLs by nebulization every 4 (four) hours as needed. Asthma) 360 mL 3  . metoprolol tartrate (LOPRESSOR) 50 MG tablet Take 50 mg by mouth daily.     . naloxone (NARCAN) nasal spray 4 mg/0.1 mL For Opioid Overdose: Call 911 and administer a single spray of Narcan in one nostril. Repeat every 41mns as needed if no or minimal response 1 kit 2  . NINLARO 3 MG capsule TAKE 1 CAPSULE (3 MG TOTAL) BY MOUTH EVERY WEDNESDAY. TAKE FOR 3 WEEKS ON, THEN 1 WEEK OFF. TAKE ON AN EMPTY STOMACH 1HR BEFORE OR 2HRS AFTER MEALS 3 capsule 4  . ondansetron (ZOFRAN) 8 MG tablet TAKE ONE TABLET BY MOUTH EVERY 8 HOURS AS NEEDED (Patient taking differently: Take 8 mg by mouth every 8 (eight) hours as needed for nausea or vomiting. ) 60 tablet 3  . OXYGEN Inhale 3 L into  the lungs continuous.    . polyethylene glycol powder (GLYCOLAX/MIRALAX) 17 GM/SCOOP powder Take 17 g by mouth daily.    . potassium chloride SA (KLOR-CON) 20 MEQ tablet Take 1 tablet by mouth daily.    Marland Kitchen PROAIR HFA 108 (90 Base) MCG/ACT inhaler INHALE 2 PUFFS BY MOUTH INTO THE LUNGS EVERY 6 HOURS AS NEEDED FOR WHEEZING OR SHORTNESS OF BREATH (Patient taking differently: Inhale 2 puffs into the lungs every 6 (six) hours as needed for wheezing or shortness of breath. ) 8.5 g 6  . Fluticasone-Salmeterol (ADVAIR DISKUS) 250-50 MCG/DOSE AEPB Inhale 1 puff into the lungs 2 (two) times daily. 3 each 2  . sertraline (ZOLOFT) 50 MG tablet Take 1 tablet (50 mg total) by mouth daily. 30 tablet 6  . tiotropium (SPIRIVA HANDIHALER) 18 MCG inhalation capsule Place 1 capsule (18 mcg total) into inhaler and inhale daily. 30 capsule 6   No current facility-administered medications for this visit.    PHYSICAL EXAMINATION: ECOG PERFORMANCE STATUS: 2 - Symptomatic, <50% confined to bed  BP 115/65   Pulse (!) 48   Wt 117 lb  (53.1 kg)   SpO2 100% Comment: 3liters o2  BMI 15.87 kg/m   Filed Weights   06/30/19 0949  Weight: 117 lb (53.1 kg)    Physical Exam  Constitutional: He is oriented to person, place, and time.  Cachectic appearing Caucasian male patient; on room air.  He is alone.  He is walking by himself.  HENT:  Head: Normocephalic and atraumatic.  Mouth/Throat: Oropharynx is clear and moist. No oropharyngeal exudate.  Eyes: Pupils are equal, round, and reactive to light.  Cardiovascular: Normal rate and regular rhythm.  Pulmonary/Chest: No respiratory distress.  Bilateral decreased air entry.  Abdominal: Soft. Bowel sounds are normal. He exhibits no distension and no mass. There is no abdominal tenderness. There is no rebound and no guarding.  Musculoskeletal:        General: No tenderness or edema. Normal range of motion.     Cervical back: Normal range of motion and neck supple.  Neurological: He is alert and oriented to person, place, and time.  Skin: Skin is warm.  Psychiatric: Affect normal.       LABORATORY DATA:  I have reviewed the data as listed    Component Value Date/Time   NA 140 06/30/2019 0916   NA 135 05/12/2014 1343   K 3.7 06/30/2019 0916   K 3.3 (L) 05/12/2014 1343   CL 100 06/30/2019 0916   CL 101 05/12/2014 1343   CO2 31 06/30/2019 0916   CO2 27 05/12/2014 1343   GLUCOSE 97 06/30/2019 0916   GLUCOSE 77 05/12/2014 1343   BUN 10 06/30/2019 0916   BUN 9 05/12/2014 1343   CREATININE 0.76 06/30/2019 0916   CREATININE 0.89 05/12/2014 1343   CALCIUM 9.3 06/30/2019 0916   CALCIUM 8.8 (L) 05/12/2014 1343   PROT 6.8 06/30/2019 0916   PROT 5.9 (L) 02/17/2014 1405   ALBUMIN 3.5 06/30/2019 0916   ALBUMIN 2.8 (L) 02/17/2014 1405   AST 15 06/30/2019 0916   AST 9 (L) 02/17/2014 1405   ALT 11 06/30/2019 0916   ALT 12 (L) 02/17/2014 1405   ALKPHOS 49 06/30/2019 0916   ALKPHOS 44 (L) 02/17/2014 1405   BILITOT 0.4 06/30/2019 0916   BILITOT 0.2 02/17/2014 1405    GFRNONAA >60 06/30/2019 0916   GFRNONAA >60 05/12/2014 1343   GFRAA >60 06/30/2019 0916   GFRAA >60 05/12/2014 1343  No results found for: SPEP, UPEP  Lab Results  Component Value Date   WBC 13.5 (H) 06/30/2019   NEUTROABS 10.7 (H) 06/30/2019   HGB 13.2 06/30/2019   HCT 40.2 06/30/2019   MCV 85.7 06/30/2019   PLT 239 06/30/2019      Chemistry      Component Value Date/Time   NA 140 06/30/2019 0916   NA 135 05/12/2014 1343   K 3.7 06/30/2019 0916   K 3.3 (L) 05/12/2014 1343   CL 100 06/30/2019 0916   CL 101 05/12/2014 1343   CO2 31 06/30/2019 0916   CO2 27 05/12/2014 1343   BUN 10 06/30/2019 0916   BUN 9 05/12/2014 1343   CREATININE 0.76 06/30/2019 0916   CREATININE 0.89 05/12/2014 1343      Component Value Date/Time   CALCIUM 9.3 06/30/2019 0916   CALCIUM 8.8 (L) 05/12/2014 1343   ALKPHOS 49 06/30/2019 0916   ALKPHOS 44 (L) 02/17/2014 1405   AST 15 06/30/2019 0916   AST 9 (L) 02/17/2014 1405   ALT 11 06/30/2019 0916   ALT 12 (L) 02/17/2014 1405   BILITOT 0.4 06/30/2019 0916   BILITOT 0.2 02/17/2014 1405       RADIOGRAPHIC STUDIES: I have personally reviewed the radiological images as listed and agreed with the findings in the report. No results found.   ASSESSMENT & PLAN:  Multiple myeloma in relapse ([place]) # MULTIPLE MYELOMA-AUG 2019 recurrent;   Oct 2019- PET- no lytic lesions noted.  Clinically STABLE.   # Currently on Ninlaro-Dex [since 01/02/2018]- PR noted; April -2021 0.4; Kappa light chains-slightly abnormal- 2.65.  Continue Ninlaro-Dex weekly.   # Bilateral subcentimeter lung nodules -CT scan February 2021-stable lung nodules; follow-up CT in 12 months. STABLE.   # COPD/smoking-related changes noted on CT scan; advair/spiriva/O2- STABLE.   # Chronic nausea-question secondary to Ninlaro.  STABLE.   # Chronic pain-STABLE; 15 mgcurrently on buprenorphine pain patch once a week; pain management];   # Neuropathy-STABLE; Neurontin 2 pill BID; 1  in AF; recommend evaluation with neurology. Stopped cymblata sec to impotence issues.   # Depression: Recommend Zoloft.  Discussed the potential side effects.  # DISPOSITION: # follow up in 1 month-MD cbc/cmp/MMpanel/K-L light chain;- Dr.B   Orders Placed This Encounter  Procedures  . CBC with Differential    Standing Status:   Future    Standing Expiration Date:   06/29/2020  . Comprehensive metabolic panel    Standing Status:   Future    Standing Expiration Date:   06/29/2020  . Multiple Myeloma Panel (SPEP&IFE w/QIG)    Standing Status:   Future    Standing Expiration Date:   06/29/2020  . Kappa/lambda light chains    Standing Status:   Future    Standing Expiration Date:   06/29/2020   All questions were answered. The patient knows to call the clinic with any problems, questions or concerns.      Cammie Sickle, MD 07/06/2019 7:14 PM

## 2019-07-01 ENCOUNTER — Other Ambulatory Visit: Payer: Medicaid Other

## 2019-07-01 ENCOUNTER — Ambulatory Visit: Payer: Medicaid Other | Admitting: Internal Medicine

## 2019-07-01 LAB — KAPPA/LAMBDA LIGHT CHAINS
Kappa free light chain: 16.5 mg/L (ref 3.3–19.4)
Kappa, lambda light chain ratio: 2.01 — ABNORMAL HIGH (ref 0.26–1.65)
Lambda free light chains: 8.2 mg/L (ref 5.7–26.3)

## 2019-07-02 LAB — MULTIPLE MYELOMA PANEL, SERUM
Albumin SerPl Elph-Mcnc: 3.2 g/dL (ref 2.9–4.4)
Albumin/Glob SerPl: 1.2 (ref 0.7–1.7)
Alpha 1: 0.4 g/dL (ref 0.0–0.4)
Alpha2 Glob SerPl Elph-Mcnc: 0.9 g/dL (ref 0.4–1.0)
B-Globulin SerPl Elph-Mcnc: 1 g/dL (ref 0.7–1.3)
Gamma Glob SerPl Elph-Mcnc: 0.7 g/dL (ref 0.4–1.8)
Globulin, Total: 2.9 g/dL (ref 2.2–3.9)
IgA: 64 mg/dL — ABNORMAL LOW (ref 90–386)
IgG (Immunoglobin G), Serum: 834 mg/dL (ref 603–1613)
IgM (Immunoglobulin M), Srm: 41 mg/dL (ref 20–172)
M Protein SerPl Elph-Mcnc: 0.4 g/dL — ABNORMAL HIGH
Total Protein ELP: 6.1 g/dL (ref 6.0–8.5)

## 2019-07-14 MED FILL — NINLARO 3 MG CAPS: 3 | 28 days supply | Qty: 3 | Fill #1

## 2019-07-15 ENCOUNTER — Ambulatory Visit: Payer: Medicaid Other | Admitting: Pulmonary Disease

## 2019-08-01 ENCOUNTER — Ambulatory Visit: Payer: Medicaid Other | Admitting: Primary Care

## 2019-08-04 ENCOUNTER — Inpatient Hospital Stay: Payer: Medicaid Other

## 2019-08-04 ENCOUNTER — Inpatient Hospital Stay: Payer: Medicaid Other | Admitting: Internal Medicine

## 2019-08-06 MED FILL — NINLARO 3 MG CAPS: 3 | 28 days supply | Qty: 3 | Fill #2

## 2019-08-08 ENCOUNTER — Other Ambulatory Visit: Payer: Self-pay

## 2019-08-08 ENCOUNTER — Encounter: Payer: Self-pay | Admitting: Internal Medicine

## 2019-08-11 ENCOUNTER — Inpatient Hospital Stay: Payer: Medicaid Other | Admitting: Internal Medicine

## 2019-08-11 ENCOUNTER — Inpatient Hospital Stay: Payer: Medicaid Other

## 2019-08-18 ENCOUNTER — Telehealth: Payer: Self-pay | Admitting: Internal Medicine

## 2019-08-18 ENCOUNTER — Inpatient Hospital Stay: Payer: Medicaid Other | Admitting: Internal Medicine

## 2019-08-18 ENCOUNTER — Inpatient Hospital Stay: Payer: Medicaid Other

## 2019-08-18 MED ORDER — PREDNISONE 20 MG PO TABS: 20.0000 mg | ORAL_TABLET | Freq: Every day | ORAL | 0 refills | Status: DC

## 2019-08-18 MED ORDER — AZITHROMYCIN 250 MG PO TABS
ORAL_TABLET | ORAL | 0 refills | Status: AC
Start: 2019-08-18 — End: 2019-08-23

## 2019-08-18 NOTE — Telephone Encounter (Signed)
Pt wife called back - she can be reached at [phone] -pr

## 2019-08-18 NOTE — Telephone Encounter (Signed)
Called and spoke to pt's spouse, Megan(DPR). [name] stated that pt is experiencing prod cough with yellow mucus, wheezing and increased sob with coughing spell x1w.   Pt had low grade of 99.9 last week.  Pt has tried mucinex without relief.   Dr. Mortimer Fries please advise. Thanks.

## 2019-08-18 NOTE — Telephone Encounter (Signed)
Lm for pt's spouse, [name] (DPR).

## 2019-08-18 NOTE — Telephone Encounter (Signed)
Pred 20 daily for 10 days Z pak

## 2019-08-18 NOTE — Telephone Encounter (Signed)
Lm for pt's spouse, Megan(DPR).

## 2019-08-18 NOTE — Telephone Encounter (Signed)
Pt is aware of recommendations and voiced his understanding.  Rx for prednisone and zpak has been sent to preferred pharmacy.  Nothing further is needed.

## 2019-08-25 ENCOUNTER — Inpatient Hospital Stay: Payer: Medicaid Other

## 2019-08-25 ENCOUNTER — Other Ambulatory Visit: Payer: Self-pay

## 2019-08-25 ENCOUNTER — Encounter: Payer: Self-pay | Admitting: Internal Medicine

## 2019-08-25 ENCOUNTER — Inpatient Hospital Stay: Payer: Medicaid Other | Attending: Internal Medicine | Admitting: Internal Medicine

## 2019-08-25 DIAGNOSIS — Z7982 Long term (current) use of aspirin: Secondary | ICD-10-CM | POA: Insufficient documentation

## 2019-08-25 DIAGNOSIS — Z7951 Long term (current) use of inhaled steroids: Secondary | ICD-10-CM | POA: Diagnosis not present

## 2019-08-25 DIAGNOSIS — K219 Gastro-esophageal reflux disease without esophagitis: Secondary | ICD-10-CM | POA: Diagnosis not present

## 2019-08-25 DIAGNOSIS — G8929 Other chronic pain: Secondary | ICD-10-CM | POA: Diagnosis not present

## 2019-08-25 DIAGNOSIS — F419 Anxiety disorder, unspecified: Secondary | ICD-10-CM | POA: Diagnosis not present

## 2019-08-25 DIAGNOSIS — Z8249 Family history of ischemic heart disease and other diseases of the circulatory system: Secondary | ICD-10-CM | POA: Diagnosis not present

## 2019-08-25 DIAGNOSIS — D72829 Elevated white blood cell count, unspecified: Secondary | ICD-10-CM | POA: Insufficient documentation

## 2019-08-25 DIAGNOSIS — F329 Major depressive disorder, single episode, unspecified: Secondary | ICD-10-CM | POA: Insufficient documentation

## 2019-08-25 DIAGNOSIS — F17210 Nicotine dependence, cigarettes, uncomplicated: Secondary | ICD-10-CM | POA: Insufficient documentation

## 2019-08-25 DIAGNOSIS — G629 Polyneuropathy, unspecified: Secondary | ICD-10-CM | POA: Insufficient documentation

## 2019-08-25 DIAGNOSIS — C9002 Multiple myeloma in relapse: Secondary | ICD-10-CM | POA: Diagnosis not present

## 2019-08-25 DIAGNOSIS — Z7952 Long term (current) use of systemic steroids: Secondary | ICD-10-CM | POA: Insufficient documentation

## 2019-08-25 DIAGNOSIS — I2510 Atherosclerotic heart disease of native coronary artery without angina pectoris: Secondary | ICD-10-CM | POA: Insufficient documentation

## 2019-08-25 DIAGNOSIS — E785 Hyperlipidemia, unspecified: Secondary | ICD-10-CM | POA: Insufficient documentation

## 2019-08-25 DIAGNOSIS — I252 Old myocardial infarction: Secondary | ICD-10-CM | POA: Diagnosis not present

## 2019-08-25 DIAGNOSIS — I509 Heart failure, unspecified: Secondary | ICD-10-CM | POA: Diagnosis not present

## 2019-08-25 DIAGNOSIS — Z79899 Other long term (current) drug therapy: Secondary | ICD-10-CM | POA: Diagnosis not present

## 2019-08-25 DIAGNOSIS — I4891 Unspecified atrial fibrillation: Secondary | ICD-10-CM | POA: Diagnosis not present

## 2019-08-25 DIAGNOSIS — Z8673 Personal history of transient ischemic attack (TIA), and cerebral infarction without residual deficits: Secondary | ICD-10-CM | POA: Diagnosis not present

## 2019-08-25 LAB — CBC WITH DIFFERENTIAL/PLATELET
Abs Immature Granulocytes: 0.17 10*3/uL — ABNORMAL HIGH (ref 0.00–0.07)
Basophils Absolute: 0 10*3/uL (ref 0.0–0.1)
Basophils Relative: 0 %
Eosinophils Absolute: 0 10*3/uL (ref 0.0–0.5)
Eosinophils Relative: 0 %
HCT: 39.8 % (ref 39.0–52.0)
Hemoglobin: 12.9 g/dL — ABNORMAL LOW (ref 13.0–17.0)
Immature Granulocytes: 1 %
Lymphocytes Relative: 2 %
Lymphs Abs: 0.5 10*3/uL — ABNORMAL LOW (ref 0.7–4.0)
MCH: 27.3 pg (ref 26.0–34.0)
MCHC: 32.4 g/dL (ref 30.0–36.0)
MCV: 84.1 fL (ref 80.0–100.0)
Monocytes Absolute: 0.2 10*3/uL (ref 0.1–1.0)
Monocytes Relative: 1 %
Neutro Abs: 20.9 10*3/uL — ABNORMAL HIGH (ref 1.7–7.7)
Neutrophils Relative %: 96 %
Platelets: 399 10*3/uL (ref 150–400)
RBC: 4.73 MIL/uL (ref 4.22–5.81)
RDW: 15.9 % — ABNORMAL HIGH (ref 11.5–15.5)
WBC: 21.8 10*3/uL — ABNORMAL HIGH (ref 4.0–10.5)
nRBC: 0 % (ref 0.0–0.2)

## 2019-08-25 LAB — COMPREHENSIVE METABOLIC PANEL
ALT: 16 U/L (ref 0–44)
AST: 23 U/L (ref 15–41)
Albumin: 3.2 g/dL — ABNORMAL LOW (ref 3.5–5.0)
Alkaline Phosphatase: 49 U/L (ref 38–126)
Anion gap: 8 (ref 5–15)
BUN: 10 mg/dL (ref 6–20)
CO2: 28 mmol/L (ref 22–32)
Calcium: 8.1 mg/dL — ABNORMAL LOW (ref 8.9–10.3)
Chloride: 101 mmol/L (ref 98–111)
Creatinine, Ser: 1 mg/dL (ref 0.61–1.24)
GFR calc Af Amer: >60 mL/min (ref >60)
GFR calc non Af Amer: >60 mL/min (ref >60)
Glucose, Bld: 119 mg/dL — ABNORMAL HIGH (ref 70–99)
Potassium: 3.4 mmol/L — ABNORMAL LOW (ref 3.5–5.1)
Sodium: 137 mmol/L (ref 135–145)
Total Bilirubin: 0.6 mg/dL (ref 0.3–1.2)
Total Protein: 6.4 g/dL — ABNORMAL LOW (ref 6.5–8.1)

## 2019-08-25 NOTE — Progress Notes (Signed)
[place] OFFICE PROGRESS NOTE  Patient Care Team: Patient, No Pcp Per as PCP - General (General Practice)  Cancer Staging No matching staging information was found for the patient.   Oncology History Overview Note  # SEP 2015- MULTIPLE MYELOMA [multiple PET pos Bone lesions; hypercalcemia s/p BMBx; FISH- Aneuploidy - gain of chromosome 7,9,15,FGFR3/4p16.3, and CCND1/11q13. Loss of MAF/16q23.1), cytogenetics normal 46XY] s/p Vel-Dex-Rev-Zometa; Excellent PR;   # MARCH 2016- REV-DEX ; FEB 2017-[discn Dex] Rev 25 mg 3 w On & 1 w Off; March 2017- M- protein 0.4gm/dl; K/L= 7.9; cont Rev;   # Revlimid HELD June 2018 [sec to ONJ]  # AUG 2019- RECURRENCE- NINLARO-DEX  # March 2017-  Left Middle Lobe cavitary lesion- ~86m- repeat Ct in 3-450m# AUG 2017- ONJ [[name], [place] #  ? ONJ- [phone] not follow up ]- DISCONT Zometa.; NO HBO sec to   # Chronic pain/Anxiety [ortho]  # COPD/smoking/ [[facility]- not candidate for BMT- sec to co-morbidities/poor nutritional status];    # MARCH 2020-PAIN CONTRACT: BREACH-  DECLINE ANY NARCOTICS/BENZO  # PALLIATIVE CARE: [name] [2020]  ---------------------------------------------------------------  DIAGNOSIS: MULTIPLE MYELOMA  STAGE: RECURRENT  ;GOALS: CONTROL  CURRENT/MOST RECENT THERAPY; AUG 2019- NINLARO-DEX    Multiple myeloma in relapse ([name]     INTERVAL HISTORY:  [name] [age].o.  male pleasant patient above history of recurrent/relapse multiple myeloma-currently on Ninlaro-dex is here for follow-up.  In the interim patient noted to have worsening cough/dyspnea.  Patient was treated with prednisone; and also Z-Pak.  Patient continues to be on prednisone at this time.  In the interim patient noted to have visual dysfunction right eye.  Has been evaluated by ophthalmology.  Continues to have chronic back pain joint pains.  No fevers or chills.  Chronic fatigue  Review of Systems  Constitutional:  Positive for malaise/fatigue. Negative for chills, diaphoresis and fever.  HENT: Negative for nosebleeds and sore throat.   Eyes: Negative for double vision.  Respiratory: Positive for cough and shortness of breath. Negative for hemoptysis and sputum production.   Cardiovascular: Negative for chest pain, palpitations, orthopnea and leg swelling.  Gastrointestinal: Negative for abdominal pain, blood in stool, constipation, diarrhea, heartburn, melena and vomiting.  Genitourinary: Negative for dysuria, frequency and urgency.  Musculoskeletal: Positive for back pain and joint pain.  Skin: Negative.  Negative for itching and rash.  Neurological: Positive for tingling. Negative for dizziness, focal weakness, weakness and headaches.  Endo/Heme/Allergies: Does not bruise/bleed easily.  Psychiatric/Behavioral: Negative for depression. The patient is not nervous/anxious and does not have insomnia.       PAST MEDICAL HISTORY :  Past Medical History:  Diagnosis Date  . Anxiety   . Arthritis   . Atrial septal defect   . CHF (congestive heart failure) ([facility]  . COPD (chronic obstructive pulmonary disease) (HCC)    on 3L O2 - continuous  . Coronary artery disease   . CVA (cerebral vascular accident) (HCKapaa02/2020   "mild" - no deficits  . Depression   . Dyspnea    with exertion  . Dysrhythmia    nonsustained VT, Afib  . Dysrhythmias   . Environmental and seasonal allergies   . GERD (gastroesophageal reflux disease)   . Hyperlipidemia   . ICD (implantable cardioverter-defibrillator) in place [facility]22/2011   [facility] Mal[facility] ([facility]  . Multiple myeloma (HCC)    multiple myeloma  . Myocardial infarction ([facility]CM[facility]  .  Neuropathy    feet, legs, hands  . Presence of permanent cardiac pacemaker    defib pacemaker  . Substance abuse ([place])     PAST SURGICAL HISTORY :   Past Surgical History:  Procedure Laterality Date  . CARDIAC DEFIBRILLATOR PLACEMENT  08/22/2011    [employer] single chamber placed by Dr Marchia Meiers, [place]  . CATARACT EXTRACTION W/PHACO Right 12/13/2018   Procedure: CATARACT EXTRACTION PHACO AND INTRAOCULAR LENS PLACEMENT ([place]) RIGHT;  Surgeon: Birder Robson, MD;  Location: [facility] ORS;  Service: Ophthalmology;  Laterality: Right;  [place] 01:21.4 CDE 15.56 FLUID PACK LOT # [id number]  . CATARACT EXTRACTION W/PHACO Left 01/23/2019   Procedure: CATARACT EXTRACTION PHACO AND INTRAOCULAR LENS PLACEMENT ([place]) LEFT;  Surgeon: Birder Robson, MD;  Location: [facility] ORS;  Service: Ophthalmology;  Laterality: Left;  [place] 01:13.5 CDE 13.33 Fluid Pack lot # [id number]  . TOOTH EXTRACTION      FAMILY HISTORY :   Family History  Problem Relation Age of Onset  . COPD Mother   . CAD Mother   . Heart attack Father   . Prostate cancer Maternal Grandfather   . Kidney cancer Neg Hx   . Bladder Cancer Neg Hx     SOCIAL HISTORY:   Social History   Tobacco Use  . Smoking status: Current Every Day Smoker    Packs/day: 0.50    Years: 40.00    Pack years: 20.00    Types: Cigarettes  . Smokeless tobacco: Never Used  Vaping Use  . Vaping Use: Never used  Substance Use Topics  . Alcohol use: No    Alcohol/week: 0.0 standard drinks  . Drug use: No    ALLERGIES:  has No Known Allergies.  MEDICATIONS:  Current Outpatient Medications  Medication Sig Dispense Refill  . acetaminophen (TYLENOL) 325 MG tablet Take 2 tablets (650 mg total) by mouth every 6 (six) hours as needed for mild pain (or Fever >/= 101). (Patient taking differently: Take 650 mg by mouth every 6 (six) hours as needed for mild pain (or Fever >/= 101). )    . aspirin EC 81 MG EC tablet Take 1 tablet (81 mg total) by mouth daily.    . Buprenorphine (BUTRANS) 15 MCG/HR PTWK Place 1 patch onto the skin once a week.    . Fluticasone-Salmeterol (ADVAIR DISKUS) 250-50 MCG/DOSE AEPB Inhale 1 puff into the lungs 2 (two) times daily. 3 each 2  . metoprolol tartrate (LOPRESSOR) 50 MG  tablet Take 50 mg by mouth daily.     . ondansetron (ZOFRAN) 8 MG tablet TAKE ONE TABLET BY MOUTH EVERY 8 HOURS AS NEEDED 60 tablet 3  . OXYGEN Inhale 3 L into the lungs continuous.    . polyethylene glycol powder (GLYCOLAX/MIRALAX) 17 GM/SCOOP powder Take 17 g by mouth daily.    . potassium chloride SA (KLOR-CON) 20 MEQ tablet Take 1 tablet by mouth daily.    . predniSONE (DELTASONE) 20 MG tablet Take 1 tablet (20 mg total) by mouth daily. 10 tablet 0  . PROAIR HFA 108 (90 Base) MCG/ACT inhaler INHALE 2 PUFFS BY MOUTH INTO THE LUNGS EVERY 6 HOURS AS NEEDED FOR WHEEZING OR SHORTNESS OF BREATH (Patient taking differently: Inhale 2 puffs into the lungs every 6 (six) hours as needed for wheezing or shortness of breath. ) 8.5 g 6  . sertraline (ZOLOFT) 50 MG tablet Take 1 tablet (50 mg total) by mouth daily. 30 tablet 6  . tiotropium (SPIRIVA HANDIHALER) 18 MCG inhalation capsule  Place 1 capsule (18 mcg total) into inhaler and inhale daily. 30 capsule 6  . dexamethasone (DECADRON) 4 MG tablet TAKE THREE TABLETS BY MOUTH ONCE A WEEK IN MORNING WITH BREAKFAST (Patient not taking: Reported on 08/08/2019) 30 tablet 4  . gabapentin (NEURONTIN) 400 MG capsule TAKE TWO CAPSULES BY MOUTH EVERY MORNING, ONE CAPSULE IN THE AFTERNOON & TWO CAPSULES EVERY EVENING 150 capsule 2  . ipratropium-albuterol (DUONEB) 0.5-2.5 (3) MG/3ML SOLN Take 3 mLs by nebulization every 4 (four) hours as needed. (Patient not taking: Reported on 08/08/2019) 360 mL 3  . naloxone (NARCAN) nasal spray 4 mg/0.1 mL For Opioid Overdose: Call 911 and administer a single spray of Narcan in one nostril. Repeat every 30mns as needed if no or minimal response (Patient not taking: Reported on 08/08/2019) 1 kit 2  . NINLARO 3 MG capsule TAKE 1 CAPSULE (3 MG TOTAL) BY MOUTH EVERY WEDNESDAY. TAKE FOR 3 WEEKS ON, THEN 1 WEEK OFF. TAKE ON AN EMPTY STOMACH 1HR BEFORE OR 2HRS AFTER MEALS (Patient not taking: Reported on 08/08/2019) 3 capsule 4   No current  facility-administered medications for this visit.    PHYSICAL EXAMINATION: ECOG PERFORMANCE STATUS: 2 - Symptomatic, <50% confined to bed  BP (!) 88/55 (BP Location: Left Arm, Patient Position: Sitting, Cuff Size: Normal)   Pulse (!) 49   Temp (!) 97 F (36.1 C) (Tympanic)   Resp 20   Ht 6' (1.829 m)   Wt 117 lb (53.1 kg)   SpO2 99%   BMI 15.87 kg/m   Filed Weights   08/25/19 1331  Weight: 117 lb (53.1 kg)    Physical Exam Constitutional:      Comments: Cachectic appearing Caucasian male patient; on room air.  He is alone.  He is walking by himself.  HENT:     Head: Normocephalic and atraumatic.     Mouth/Throat:     Pharynx: No oropharyngeal exudate.  Eyes:     Pupils: Pupils are equal, round, and reactive to light.  Cardiovascular:     Rate and Rhythm: Normal rate and regular rhythm.  Pulmonary:     Effort: No respiratory distress.     Comments: Decreased breath sounds bil; right sided wheezing.  Abdominal:     General: Bowel sounds are normal. There is no distension.     Palpations: Abdomen is soft. There is no mass.     Tenderness: There is no abdominal tenderness. There is no guarding or rebound.  Musculoskeletal:        General: No tenderness. Normal range of motion.     Cervical back: Normal range of motion and neck supple.  Skin:    General: Skin is warm.  Neurological:     Mental Status: He is alert and oriented to person, place, and time.  Psychiatric:        Mood and Affect: Affect normal.        LABORATORY DATA:  I have reviewed the data as listed    Component Value Date/Time   NA 137 08/25/2019 1318   NA 135 05/12/2014 1343   K 3.4 (L) 08/25/2019 1318   K 3.3 (L) 05/12/2014 1343   CL 101 08/25/2019 1318   CL 101 05/12/2014 1343   CO2 28 08/25/2019 1318   CO2 27 05/12/2014 1343   GLUCOSE 119 (H) 08/25/2019 1318   GLUCOSE 77 05/12/2014 1343   BUN 10 08/25/2019 1318   BUN 9 05/12/2014 1343   CREATININE 1.00 08/25/2019 1318  CREATININE  0.89 05/12/2014 1343   CALCIUM 8.1 (L) 08/25/2019 1318   CALCIUM 8.8 (L) 05/12/2014 1343   PROT 6.4 (L) 08/25/2019 1318   PROT 5.9 (L) 02/17/2014 1405   ALBUMIN 3.2 (L) 08/25/2019 1318   ALBUMIN 2.8 (L) 02/17/2014 1405   AST 23 08/25/2019 1318   AST 9 (L) 02/17/2014 1405   ALT 16 08/25/2019 1318   ALT 12 (L) 02/17/2014 1405   ALKPHOS 49 08/25/2019 1318   ALKPHOS 44 (L) 02/17/2014 1405   BILITOT 0.6 08/25/2019 1318   BILITOT 0.2 02/17/2014 1405   GFRNONAA >60 08/25/2019 1318   GFRNONAA >60 05/12/2014 1343   GFRAA >60 08/25/2019 1318   GFRAA >60 05/12/2014 1343    No results found for: SPEP, UPEP  Lab Results  Component Value Date   WBC 21.8 (H) 08/25/2019   NEUTROABS 20.9 (H) 08/25/2019   HGB 12.9 (L) 08/25/2019   HCT 39.8 08/25/2019   MCV 84.1 08/25/2019   PLT 399 08/25/2019      Chemistry      Component Value Date/Time   NA 137 08/25/2019 1318   NA 135 05/12/2014 1343   K 3.4 (L) 08/25/2019 1318   K 3.3 (L) 05/12/2014 1343   CL 101 08/25/2019 1318   CL 101 05/12/2014 1343   CO2 28 08/25/2019 1318   CO2 27 05/12/2014 1343   BUN 10 08/25/2019 1318   BUN 9 05/12/2014 1343   CREATININE 1.00 08/25/2019 1318   CREATININE 0.89 05/12/2014 1343      Component Value Date/Time   CALCIUM 8.1 (L) 08/25/2019 1318   CALCIUM 8.8 (L) 05/12/2014 1343   ALKPHOS 49 08/25/2019 1318   ALKPHOS 44 (L) 02/17/2014 1405   AST 23 08/25/2019 1318   AST 9 (L) 02/17/2014 1405   ALT 16 08/25/2019 1318   ALT 12 (L) 02/17/2014 1405   BILITOT 0.6 08/25/2019 1318   BILITOT 0.2 02/17/2014 1405       RADIOGRAPHIC STUDIES: I have personally reviewed the radiological images as listed and agreed with the findings in the report. No results found.   ASSESSMENT & PLAN:  Multiple myeloma in relapse ([place]) # MULTIPLE MYELOMA-AUG 2019 recurrent;   Oct 2019- PET- no lytic lesions noted.  Clinically STABLE.   # Currently on Ninlaro-Dex [since 01/02/2018]- PR noted; June- 2021 -0.3 Kappa light  chains-slightly abnormal- 2.17.  Continue Ninlaro-Dex weekly.   #Cough shortness of breath-wheezing/COPD exacerbation; leukocytosis likely secondary steroids-recommend chest x-ray; again reminded to quit smoking.  # Chronic nausea-question secondary to Ninlaro.  Stable.   # Chronic pain-STABLE; 15 mgcurrently on buprenorphine pain patch once a week; pain management];   # Neuropathy-STABLE; Neurontin 2 pill BID; 1 in AF;STABLE.   # Depression: Continue Zoloft stable.  # Bilateral subcentimeter lung nodules -CT scan February 2021-stable lung nodules; follow-up CT in 12 months. STABLE.   #Borderline hypotension systolic 29W.  Recommend taking metoprolol half-25 mg.  # DISPOSITION: # follow up in 1 month-MD cbc/cmp/MMpanel/K-L light chain;- Dr.B   Orders Placed This Encounter  Procedures  . DG Chest 2 View    Standing Status:   Future    Standing Expiration Date:   08/24/2020    Order Specific Question:   Reason for Exam (SYMPTOM  OR DIAGNOSIS REQUIRED)    Answer:   cough; Multiple myeloma    Order Specific Question:   Preferred imaging location?    Answer:   [facility]  . CBC with Differential    Standing Status:  Future    Standing Expiration Date:   08/24/2020  . Comprehensive metabolic panel    Standing Status:   Future    Standing Expiration Date:   08/24/2020  . Multiple Myeloma Panel (SPEP&IFE w/QIG)    Standing Status:   Future    Standing Expiration Date:   08/24/2020  . Kappa/lambda light chains    Standing Status:   Future    Standing Expiration Date:   08/24/2020   All questions were answered. The patient knows to call the clinic with any problems, questions or concerns.      Cammie Sickle, MD 08/31/2019 8:20 PM

## 2019-08-25 NOTE — Assessment & Plan Note (Addendum)
#  MULTIPLE MYELOMA-AUG 2019 recurrent;   Oct 2019- PET- no lytic lesions noted.  Clinically STABLE.  ° °# Currently on Ninlaro-Dex [since 01/02/2018]- PR noted; June- 2021 -0.3 Kappa light chains-slightly abnormal- 2.17.  Continue Ninlaro-Dex weekly.  ° °#Cough shortness of breath-wheezing/COPD exacerbation; leukocytosis likely secondary steroids-recommend chest x-ray; again reminded to quit smoking. ° °# Chronic nausea-question secondary to Ninlaro.  Stable.  ° °# Chronic pain-STABLE; 15 mgcurrently on buprenorphine pain patch once a week; pain management];  ° °# Neuropathy-STABLE; Neurontin 2 pill BID; 1 in AF;STABLE.  ° °# Depression: Continue Zoloft stable. ° °# Bilateral subcentimeter lung nodules -CT scan February 2021-stable lung nodules; follow-up CT in 12 months. STABLE.  ° °#Borderline hypotension systolic 90s.  Recommend taking metoprolol half-25 mg. ° °# DISPOSITION: °# follow up in 1 month-MD cbc/cmp/MMpanel/K-L light chain;- Dr.B °

## 2019-08-25 NOTE — Patient Instructions (Signed)
#   take metoprolol 25 mg [half of 50 mg]- once a day; because of low heart rate/blood pressure  # get Chest x-ray today

## 2019-08-26 ENCOUNTER — Other Ambulatory Visit: Payer: Self-pay | Admitting: Internal Medicine

## 2019-08-26 LAB — KAPPA/LAMBDA LIGHT CHAINS
Kappa free light chain: 23.6 mg/L — ABNORMAL HIGH (ref 3.3–19.4)
Kappa, lambda light chain ratio: 2.17 — ABNORMAL HIGH (ref 0.26–1.65)
Lambda free light chains: 10.9 mg/L (ref 5.7–26.3)

## 2019-08-27 LAB — MULTIPLE MYELOMA PANEL, SERUM
Albumin SerPl Elph-Mcnc: 3 g/dL (ref 2.9–4.4)
Albumin/Glob SerPl: 1.1 (ref 0.7–1.7)
Alpha 1: 0.3 g/dL (ref 0.0–0.4)
Alpha2 Glob SerPl Elph-Mcnc: 0.9 g/dL (ref 0.4–1.0)
B-Globulin SerPl Elph-Mcnc: 0.8 g/dL (ref 0.7–1.3)
Gamma Glob SerPl Elph-Mcnc: 0.9 g/dL (ref 0.4–1.8)
Globulin, Total: 2.9 g/dL (ref 2.2–3.9)
IgA: 112 mg/dL (ref 90–386)
IgG (Immunoglobin G), Serum: 920 mg/dL (ref 603–1613)
IgM (Immunoglobulin M), Srm: 78 mg/dL (ref 20–172)
M Protein SerPl Elph-Mcnc: 0.3 g/dL — ABNORMAL HIGH
Total Protein ELP: 5.9 g/dL — ABNORMAL LOW (ref 6.0–8.5)

## 2019-09-04 MED FILL — NINLARO 3 MG CAPS: 3 | 28 days supply | Qty: 3 | Fill #3

## 2019-09-25 ENCOUNTER — Other Ambulatory Visit: Payer: Self-pay | Admitting: Internal Medicine

## 2019-09-25 DIAGNOSIS — J449 Chronic obstructive pulmonary disease, unspecified: Secondary | ICD-10-CM

## 2019-10-06 ENCOUNTER — Inpatient Hospital Stay: Payer: Medicaid Other | Admitting: Internal Medicine

## 2019-10-06 ENCOUNTER — Other Ambulatory Visit: Payer: Self-pay | Admitting: *Deleted

## 2019-10-06 ENCOUNTER — Telehealth: Payer: Self-pay | Admitting: *Deleted

## 2019-10-06 ENCOUNTER — Inpatient Hospital Stay: Payer: Medicaid Other

## 2019-10-06 NOTE — Assessment & Plan Note (Deleted)
#  MULTIPLE MYELOMA-AUG 2019 recurrent;   Oct 2019- PET- no lytic lesions noted.  Clinically STABLE.   # Currently on Ninlaro-Dex [since 01/02/2018]- PR noted; June- 2021 -0.3 Kappa light chains-slightly abnormal- 2.17.  Continue Ninlaro-Dex weekly.   #Cough shortness of breath-wheezing/COPD exacerbation; leukocytosis likely secondary steroids-recommend chest x-ray; again reminded to quit smoking.  # Chronic nausea-question secondary to Ninlaro.  Stable.   # Chronic pain-STABLE; 15 mgcurrently on buprenorphine pain patch once a week; pain management];   # Neuropathy-STABLE; Neurontin 2 pill BID; 1 in AF;STABLE.   # Depression: Continue Zoloft stable.  # Bilateral subcentimeter lung nodules -CT scan February 2021-stable lung nodules; follow-up CT in 12 months. STABLE.   #Borderline hypotension systolic 28O.  Recommend taking metoprolol half-25 mg.  # DISPOSITION: # follow up in 1 month-MD cbc/cmp/MMpanel/K-L light chain;- Dr.B

## 2019-10-06 NOTE — Telephone Encounter (Signed)
Patient no showed for apt today. Left vm for patient to return my phone call to r/s the apt.

## 2019-10-06 NOTE — Progress Notes (Deleted)
[place] OFFICE PROGRESS NOTE  Patient Care Team: Patient, No Pcp Per as PCP - General (General Practice)  Cancer Staging No matching staging information was found for the patient.   Oncology History Overview Note  # SEP 2015- MULTIPLE MYELOMA [multiple PET pos Bone lesions; hypercalcemia s/p BMBx; FISH- Aneuploidy - gain of chromosome 7,9,15,FGFR3/4p16.3, and CCND1/11q13. Loss of MAF/16q23.1), cytogenetics normal 46XY] s/p Vel-Dex-Rev-Zometa; Excellent PR;   # MARCH 2016- REV-DEX ; FEB 2017-[discn Dex] Rev 25 mg 3 w On & 1 w Off; March 2017- M- protein 0.4gm/dl; K/L= 7.9; cont Rev;   # Revlimid HELD June 2018 [sec to [facility]]  # AUG 2019- RECURRENCE- NINLARO-DEX  # March 2017-  Left Middle Lobe cavitary lesion- ~86m- repeat Ct in 3-450m# AUG 2017- ON[facility] [[name], [place] #  ? [facility]- [phone] not follow up ]- DISCONT Zometa.; NO HBO sec to   # Chronic pain/Anxiety [ortho]  # COPD/smoking/ [[facility]- not candidate for BMT- sec to co-morbidities/poor nutritional status];    # MARCH 2020-PAIN CONTRACT: BREACH-  DECLINE ANY NARCOTICS/BENZO  # PALLIATIVE CARE: [name] [2020]  ---------------------------------------------------------------  DIAGNOSIS: MULTIPLE MYELOMA  STAGE: RECURRENT  ;GOALS: CONTROL  CURRENT/MOST RECENT THERAPY; AUG 2019- NINLARO-DEX    Multiple myeloma in relapse ([name]     INTERVAL HISTORY:  [name] [age].o.  male pleasant patient above history of recurrent/relapse multiple myeloma-currently on Ninlaro-dex is here for follow-up.  In the interim patient noted to have worsening cough/dyspnea.  Patient was treated with prednisone; and also Z-Pak.  Patient continues to be on prednisone at this time.  In the interim patient noted to have visual dysfunction right eye.  Has been evaluated by ophthalmology.  Continues to have chronic back pain joint pains.  No fevers or chills.  Chronic fatigue  Review of Systems  Constitutional:  Positive for malaise/fatigue. Negative for chills, diaphoresis and fever.  HENT: Negative for nosebleeds and sore throat.   Eyes: Negative for double vision.  Respiratory: Positive for cough and shortness of breath. Negative for hemoptysis and sputum production.   Cardiovascular: Negative for chest pain, palpitations, orthopnea and leg swelling.  Gastrointestinal: Negative for abdominal pain, blood in stool, constipation, diarrhea, heartburn, melena and vomiting.  Genitourinary: Negative for dysuria, frequency and urgency.  Musculoskeletal: Positive for back pain and joint pain.  Skin: Negative.  Negative for itching and rash.  Neurological: Positive for tingling. Negative for dizziness, focal weakness, weakness and headaches.  Endo/Heme/Allergies: Does not bruise/bleed easily.  Psychiatric/Behavioral: Negative for depression. The patient is not nervous/anxious and does not have insomnia.       PAST MEDICAL HISTORY :  Past Medical History:  Diagnosis Date  . Anxiety   . Arthritis   . Atrial septal defect   . CHF (congestive heart failure) ([facility]  . COPD (chronic obstructive pulmonary disease) (HCC)    on 3L O2 - continuous  . Coronary artery disease   . CVA (cerebral vascular accident) (HCKapaa02/2020   "mild" - no deficits  . Depression   . Dyspnea    with exertion  . Dysrhythmia    nonsustained VT, Afib  . Dysrhythmias   . Environmental and seasonal allergies   . GERD (gastroesophageal reflux disease)   . Hyperlipidemia   . ICD (implantable cardioverter-defibrillator) in place [facility]22/2011   [facility] ([facility]  . Multiple myeloma (HCC)    multiple myeloma  . Myocardial infarction (HCMilan  .  Neuropathy    feet, legs, hands  . Presence of permanent cardiac pacemaker    defib pacemaker  . Substance abuse ([place])     PAST SURGICAL HISTORY :   Past Surgical History:  Procedure Laterality Date  . CARDIAC DEFIBRILLATOR PLACEMENT  08/22/2011    [employer] single chamber placed by Dr Marchia Meiers, [place]  . CATARACT EXTRACTION W/PHACO Right 12/13/2018   Procedure: CATARACT EXTRACTION PHACO AND INTRAOCULAR LENS PLACEMENT ([place]) RIGHT;  Surgeon: Birder Robson, MD;  Location: [facility] ORS;  Service: Ophthalmology;  Laterality: Right;  [place] 01:21.4 CDE 15.56 FLUID PACK LOT # [id number]  . CATARACT EXTRACTION W/PHACO Left 01/23/2019   Procedure: CATARACT EXTRACTION PHACO AND INTRAOCULAR LENS PLACEMENT ([place]) LEFT;  Surgeon: Birder Robson, MD;  Location: [facility] ORS;  Service: Ophthalmology;  Laterality: Left;  [place] 01:13.5 CDE 13.33 Fluid Pack lot # [id number]  . TOOTH EXTRACTION      FAMILY HISTORY :   Family History  Problem Relation Age of Onset  . COPD Mother   . CAD Mother   . Heart attack Father   . Prostate cancer Maternal Grandfather   . Kidney cancer Neg Hx   . Bladder Cancer Neg Hx     SOCIAL HISTORY:   Social History   Tobacco Use  . Smoking status: Current Every Day Smoker    Packs/day: 0.50    Years: 40.00    Pack years: 20.00    Types: Cigarettes  . Smokeless tobacco: Never Used  Vaping Use  . Vaping Use: Never used  Substance Use Topics  . Alcohol use: No    Alcohol/week: 0.0 standard drinks  . Drug use: No    ALLERGIES:  has No Known Allergies.  MEDICATIONS:  Current Outpatient Medications  Medication Sig Dispense Refill  . acetaminophen (TYLENOL) 325 MG tablet Take 2 tablets (650 mg total) by mouth every 6 (six) hours as needed for mild pain (or Fever >/= 101). (Patient taking differently: Take 650 mg by mouth every 6 (six) hours as needed for mild pain (or Fever >/= 101). )    . albuterol (PROAIR HFA) 108 (90 Base) MCG/ACT inhaler Inhale 2 puffs into the lungs every 6 (six) hours as needed for wheezing or shortness of breath. 8.5 g 0  . aspirin EC 81 MG EC tablet Take 1 tablet (81 mg total) by mouth daily.    . Buprenorphine (BUTRANS) 15 MCG/HR PTWK Place 1 patch onto the skin once a week.     Marland Kitchen dexamethasone (DECADRON) 4 MG tablet TAKE THREE TABLETS BY MOUTH ONCE A WEEK IN MORNING WITH BREAKFAST (Patient not taking: Reported on 08/08/2019) 30 tablet 4  . DULoxetine (CYMBALTA) 30 MG capsule Take 1 capsule by mouth daily before breakfast.    . Fluticasone-Salmeterol (ADVAIR DISKUS) 250-50 MCG/DOSE AEPB Inhale 1 puff into the lungs 2 (two) times daily. 3 each 2  . gabapentin (NEURONTIN) 400 MG capsule TAKE TWO CAPSULES BY MOUTH EVERY MORNING, ONE CAPSULE IN THE AFTERNOON & TWO CAPSULES EVERY EVENING 150 capsule 2  . ipratropium-albuterol (DUONEB) 0.5-2.5 (3) MG/3ML SOLN Take 3 mLs by nebulization every 4 (four) hours as needed. (Patient not taking: Reported on 08/08/2019) 360 mL 3  . metoprolol tartrate (LOPRESSOR) 50 MG tablet Take 50 mg by mouth daily.     . naloxone (NARCAN) nasal spray 4 mg/0.1 mL For Opioid Overdose: Call 911 and administer a single spray of Narcan in one nostril. Repeat every 28mns as needed if no or minimal response (Patient  not taking: Reported on 08/08/2019) 1 kit 2  . neomycin-polymyxin b-dexamethasone (MAXITROL) 3.5-10000-0.1 OINT SMARTSIG:1 Application Right Eye 4 Times Daily PRN    . neomycin-polymyxin-dexameth (MAXITROL) 0.1 % OINT Administer 1 application to the right eye 4 (four) times a day as needed.    Marland Kitchen NINLARO 3 MG capsule TAKE 1 CAPSULE (3 MG TOTAL) BY MOUTH EVERY WEDNESDAY. TAKE FOR 3 WEEKS ON, THEN 1 WEEK OFF. TAKE ON AN EMPTY STOMACH 1HR BEFORE OR 2HRS AFTER MEALS (Patient not taking: Reported on 08/08/2019) 3 capsule 4  . ofloxacin (OCUFLOX) 0.3 % ophthalmic solution Administer 1 drop to the right eye Four (4) times a day for 14 days.    . ondansetron (ZOFRAN) 8 MG tablet TAKE ONE TABLET BY MOUTH EVERY 8 HOURS AS NEEDED 60 tablet 3  . OXYGEN Inhale 3 L into the lungs continuous.    . polyethylene glycol powder (GLYCOLAX/MIRALAX) 17 GM/SCOOP powder Take 17 g by mouth daily.    . potassium chloride SA (KLOR-CON) 20 MEQ tablet Take 1 tablet by mouth daily.     . prednisoLONE acetate (PRED FORTE) 1 % ophthalmic suspension Administer 1 drop to the right eye Four (4) times a day.    . predniSONE (DELTASONE) 20 MG tablet Take 1 tablet (20 mg total) by mouth daily. 10 tablet 0  . sertraline (ZOLOFT) 50 MG tablet Take 1 tablet (50 mg total) by mouth daily. 30 tablet 6  . tiotropium (SPIRIVA HANDIHALER) 18 MCG inhalation capsule Place 1 capsule (18 mcg total) into inhaler and inhale daily. 30 capsule 6   No current facility-administered medications for this visit.    PHYSICAL EXAMINATION: ECOG PERFORMANCE STATUS: 2 - Symptomatic, <50% confined to bed  There were no vitals taken for this visit.  There were no vitals filed for this visit.  Physical Exam Constitutional:      Comments: Cachectic appearing Caucasian male patient; on room air.  He is alone.  He is walking by himself.  HENT:     Head: Normocephalic and atraumatic.     Mouth/Throat:     Pharynx: No oropharyngeal exudate.  Eyes:     Pupils: Pupils are equal, round, and reactive to light.  Cardiovascular:     Rate and Rhythm: Normal rate and regular rhythm.  Pulmonary:     Effort: No respiratory distress.     Comments: Decreased breath sounds bil; right sided wheezing.  Abdominal:     General: Bowel sounds are normal. There is no distension.     Palpations: Abdomen is soft. There is no mass.     Tenderness: There is no abdominal tenderness. There is no guarding or rebound.  Musculoskeletal:        General: No tenderness. Normal range of motion.     Cervical back: Normal range of motion and neck supple.  Skin:    General: Skin is warm.  Neurological:     Mental Status: He is alert and oriented to person, place, and time.  Psychiatric:        Mood and Affect: Affect normal.        LABORATORY DATA:  I have reviewed the data as listed    Component Value Date/Time   NA 137 08/25/2019 1318   NA 135 05/12/2014 1343   K 3.4 (L) 08/25/2019 1318   K 3.3 (L) 05/12/2014 1343    CL 101 08/25/2019 1318   CL 101 05/12/2014 1343   CO2 28 08/25/2019 1318   CO2 27 05/12/2014  1343   GLUCOSE 119 (H) 08/25/2019 1318   GLUCOSE 77 05/12/2014 1343   BUN 10 08/25/2019 1318   BUN 9 05/12/2014 1343   CREATININE 1.00 08/25/2019 1318   CREATININE 0.89 05/12/2014 1343   CALCIUM 8.1 (L) 08/25/2019 1318   CALCIUM 8.8 (L) 05/12/2014 1343   PROT 6.4 (L) 08/25/2019 1318   PROT 5.9 (L) 02/17/2014 1405   ALBUMIN 3.2 (L) 08/25/2019 1318   ALBUMIN 2.8 (L) 02/17/2014 1405   AST 23 08/25/2019 1318   AST 9 (L) 02/17/2014 1405   ALT 16 08/25/2019 1318   ALT 12 (L) 02/17/2014 1405   ALKPHOS 49 08/25/2019 1318   ALKPHOS 44 (L) 02/17/2014 1405   BILITOT 0.6 08/25/2019 1318   BILITOT 0.2 02/17/2014 1405   GFRNONAA >60 08/25/2019 1318   GFRNONAA >60 05/12/2014 1343   GFRAA >60 08/25/2019 1318   GFRAA >60 05/12/2014 1343    No results found for: SPEP, UPEP  Lab Results  Component Value Date   WBC 21.8 (H) 08/25/2019   NEUTROABS 20.9 (H) 08/25/2019   HGB 12.9 (L) 08/25/2019   HCT 39.8 08/25/2019   MCV 84.1 08/25/2019   PLT 399 08/25/2019      Chemistry      Component Value Date/Time   NA 137 08/25/2019 1318   NA 135 05/12/2014 1343   K 3.4 (L) 08/25/2019 1318   K 3.3 (L) 05/12/2014 1343   CL 101 08/25/2019 1318   CL 101 05/12/2014 1343   CO2 28 08/25/2019 1318   CO2 27 05/12/2014 1343   BUN 10 08/25/2019 1318   BUN 9 05/12/2014 1343   CREATININE 1.00 08/25/2019 1318   CREATININE 0.89 05/12/2014 1343      Component Value Date/Time   CALCIUM 8.1 (L) 08/25/2019 1318   CALCIUM 8.8 (L) 05/12/2014 1343   ALKPHOS 49 08/25/2019 1318   ALKPHOS 44 (L) 02/17/2014 1405   AST 23 08/25/2019 1318   AST 9 (L) 02/17/2014 1405   ALT 16 08/25/2019 1318   ALT 12 (L) 02/17/2014 1405   BILITOT 0.6 08/25/2019 1318   BILITOT 0.2 02/17/2014 1405       RADIOGRAPHIC STUDIES: I have personally reviewed the radiological images as listed and agreed with the findings in the  report. No results found.   ASSESSMENT & PLAN:  No problem-specific Assessment & Plan notes found for this encounter.   No orders of the defined types were placed in this encounter.  All questions were answered. The patient knows to call the clinic with any problems, questions or concerns.      Cammie Sickle, MD 10/06/2019 1:07 PM

## 2019-10-07 MED FILL — NINLARO 3 MG CAPS: 3 | 28 days supply | Qty: 3 | Fill #4

## 2019-10-07 NOTE — Telephone Encounter (Signed)
Pt r/s for 9/22 @ 3:00 for lab/MD srw

## 2019-10-15 ENCOUNTER — Other Ambulatory Visit: Payer: Self-pay

## 2019-10-15 ENCOUNTER — Inpatient Hospital Stay: Payer: Medicaid Other | Attending: Internal Medicine

## 2019-10-15 ENCOUNTER — Inpatient Hospital Stay (HOSPITAL_BASED_OUTPATIENT_CLINIC_OR_DEPARTMENT_OTHER): Payer: Medicaid Other | Admitting: Internal Medicine

## 2019-10-15 ENCOUNTER — Encounter: Payer: Self-pay | Admitting: Internal Medicine

## 2019-10-15 VITALS — BP 109/79 | HR 105 | Temp 97.4°F | Resp 20 | Ht 72.0 in | Wt 119.0 lb

## 2019-10-15 DIAGNOSIS — Z7982 Long term (current) use of aspirin: Secondary | ICD-10-CM | POA: Diagnosis not present

## 2019-10-15 DIAGNOSIS — J449 Chronic obstructive pulmonary disease, unspecified: Secondary | ICD-10-CM | POA: Insufficient documentation

## 2019-10-15 DIAGNOSIS — E785 Hyperlipidemia, unspecified: Secondary | ICD-10-CM | POA: Insufficient documentation

## 2019-10-15 DIAGNOSIS — Z79899 Other long term (current) drug therapy: Secondary | ICD-10-CM | POA: Diagnosis not present

## 2019-10-15 DIAGNOSIS — C9002 Multiple myeloma in relapse: Secondary | ICD-10-CM

## 2019-10-15 DIAGNOSIS — I252 Old myocardial infarction: Secondary | ICD-10-CM | POA: Insufficient documentation

## 2019-10-15 DIAGNOSIS — F418 Other specified anxiety disorders: Secondary | ICD-10-CM | POA: Diagnosis not present

## 2019-10-15 DIAGNOSIS — F17210 Nicotine dependence, cigarettes, uncomplicated: Secondary | ICD-10-CM | POA: Insufficient documentation

## 2019-10-15 LAB — COMPREHENSIVE METABOLIC PANEL
ALT: 14 U/L (ref 0–44)
AST: 31 U/L (ref 15–41)
Albumin: 3.4 g/dL — ABNORMAL LOW (ref 3.5–5.0)
Alkaline Phosphatase: 52 U/L (ref 38–126)
Anion gap: 13 (ref 5–15)
BUN: 10 mg/dL (ref 6–20)
CO2: 30 mmol/L (ref 22–32)
Calcium: 8.5 mg/dL — ABNORMAL LOW (ref 8.9–10.3)
Chloride: 92 mmol/L — ABNORMAL LOW (ref 98–111)
Creatinine, Ser: 0.99 mg/dL (ref 0.61–1.24)
GFR calc Af Amer: >60 mL/min (ref >60)
GFR calc non Af Amer: >60 mL/min (ref >60)
Glucose, Bld: 84 mg/dL (ref 70–99)
Potassium: 3.2 mmol/L — ABNORMAL LOW (ref 3.5–5.1)
Sodium: 135 mmol/L (ref 135–145)
Total Bilirubin: 0.4 mg/dL (ref 0.3–1.2)
Total Protein: 7 g/dL (ref 6.5–8.1)

## 2019-10-15 LAB — CBC WITH DIFFERENTIAL/PLATELET
Abs Immature Granulocytes: 0.13 10*3/uL — ABNORMAL HIGH (ref 0.00–0.07)
Basophils Absolute: 0 10*3/uL (ref 0.0–0.1)
Basophils Relative: 0 %
Eosinophils Absolute: 0 10*3/uL (ref 0.0–0.5)
Eosinophils Relative: 0 %
HCT: 34.7 % — ABNORMAL LOW (ref 39.0–52.0)
Hemoglobin: 11.6 g/dL — ABNORMAL LOW (ref 13.0–17.0)
Immature Granulocytes: 1 %
Lymphocytes Relative: 5 %
Lymphs Abs: 1 10*3/uL (ref 0.7–4.0)
MCH: 27.8 pg (ref 26.0–34.0)
MCHC: 33.4 g/dL (ref 30.0–36.0)
MCV: 83.2 fL (ref 80.0–100.0)
Monocytes Absolute: 1.9 10*3/uL — ABNORMAL HIGH (ref 0.1–1.0)
Monocytes Relative: 9 %
Neutro Abs: 18.1 10*3/uL — ABNORMAL HIGH (ref 1.7–7.7)
Neutrophils Relative %: 85 %
Platelets: 306 10*3/uL (ref 150–400)
RBC: 4.17 MIL/uL — ABNORMAL LOW (ref 4.22–5.81)
RDW: 16.1 % — ABNORMAL HIGH (ref 11.5–15.5)
WBC: 21.1 10*3/uL — ABNORMAL HIGH (ref 4.0–10.5)
nRBC: 0 % (ref 0.0–0.2)

## 2019-10-15 NOTE — Progress Notes (Signed)
[facility] OFFICE PROGRESS NOTE  Patient Care Team: Patient, No Pcp Per as PCP - General (General Practice)  Cancer Staging No matching staging information was found for the patient.   Oncology History Overview Note  # SEP 2015- MULTIPLE MYELOMA [multiple PET pos Bone lesions; hypercalcemia s/p BMBx; FISH- Aneuploidy - gain of chromosome 7,9,15,FGFR3/4p16.3, and CCND1/11q13. Loss of MAF/16q23.1), cytogenetics normal 46XY] s/p Vel-Dex-Rev-Zometa; Excellent PR;   # MARCH 2016- REV-DEX ; FEB 2017-[discn Dex] Rev 25 mg 3 w On & 1 w Off; March 2017- M- protein 0.4gm/dl; K/L= 7.9; cont Rev;   # Revlimid HELD June 2018 [sec to [facility]]  # AUG 2019- RECURRENCE- NINLARO-DEX  # March 2017-  Left Middle Lobe cavitary lesion- ~15m- repeat Ct in 3-466m# AUG 2017- [facility] [[name], [place] #  ? [facility]- [id number] not follow up ]- DISCONT Zometa.; NO HBO sec to   # Chronic pain/Anxiety [ortho]  # COPD/smoking/ [[facility]- not candidate for BMT- sec to co-morbidities/poor nutritional status];    # MARCH 2020-PAIN CONTRACT: BREACH-  DECLINE ANY NARCOTICS/BENZO  # PALLIATIVE CARE: [name] [2020]  ---------------------------------------------------------------  DIAGNOSIS: MULTIPLE MYELOMA  STAGE: RECURRENT  ;GOALS: CONTROL  CURRENT/MOST RECENT THERAPY; AUG 2019- NINLARO-DEX    Multiple myeloma in relapse ([facility]     INTERVAL HISTORY:  [name] [id number].[id number].  male pleasant patient above history of recurrent/relapse multiple myeloma-currently on Ninlaro-dex is here for follow-up.  Patient complains of chronic back pain which is fairly well controlled on narcotic pain medication aspect through pain clinic.  Complains of worsening cough; intermittent wheezing.  Unfortunately to smoke.  No fever no chills.  Review of Systems  Constitutional: Positive for malaise/fatigue. Negative for chills, diaphoresis and fever.  HENT: Negative for nosebleeds and sore throat.   Eyes: Negative for  double vision.  Respiratory: Positive for cough and shortness of breath. Negative for hemoptysis and sputum production.   Cardiovascular: Negative for chest pain, palpitations, orthopnea and leg swelling.  Gastrointestinal: Negative for abdominal pain, blood in stool, constipation, diarrhea, heartburn, melena and vomiting.  Genitourinary: Negative for dysuria, frequency and urgency.  Musculoskeletal: Positive for back pain and joint pain.  Skin: Negative.  Negative for itching and rash.  Neurological: Positive for tingling. Negative for dizziness, focal weakness, weakness and headaches.  Endo/Heme/Allergies: Does not bruise/bleed easily.  Psychiatric/Behavioral: Negative for depression. The patient is not nervous/anxious and does not have insomnia.       PAST MEDICAL HISTORY :  Past Medical History:  Diagnosis Date  . Anxiety   . Arthritis   . Atrial septal defect   . CHF (congestive heart failure) ([facility]  . COPD (chronic obstructive pulmonary disease) (HCC)    on 3L O2 - continuous  . Coronary artery disease   . CVA (cerebral vascular accident) ([facility]02/2020   "mild" - no deficits  . Depression   . Dyspnea    with exertion  . Dysrhythmia    nonsustained VT, Afib  . Dysrhythmias   . Environmental and seasonal allergies   . GERD (gastroesophageal reflux disease)   . Hyperlipidemia   . ICD (implantable cardioverter-defibrillator) [facility] ([facility]  . Multiple myeloma (HCC)    multiple myeloma  . Myocardial infarction ([facility]  . Neuropathy    feet, legs, hands  . Presence of permanent cardiac pacemaker    defib pacemaker  . Substance abuse ([facility]  PAST SURGICAL HISTORY :   Past Surgical History:  Procedure Laterality Date  . CARDIAC DEFIBRILLATOR PLACEMENT  08/22/2011   [employer] single chamber placed by Dr Jonathan Piccini, [facility]  . CATARACT EXTRACTION W/PHACO Right 12/13/2018   Procedure: CATARACT  EXTRACTION PHACO AND INTRAOCULAR LENS PLACEMENT (IOC) RIGHT;  Surgeon: Porfilio, William, MD;  Location: [facility] ORS;  Service: Ophthalmology;  Laterality: Right;  US 01:21.4 CDE 15.56 FLUID PACK LOT # [id number]  . CATARACT EXTRACTION W/PHACO Left 01/23/2019   Procedure: CATARACT EXTRACTION PHACO AND INTRAOCULAR LENS PLACEMENT (IOC) LEFT;  Surgeon: Porfilio, William, MD;  Location: [facility] ORS;  Service: Ophthalmology;  Laterality: Left;  US 01:13.5 CDE 13.33 Fluid Pack lot # [id number]  . TOOTH EXTRACTION      FAMILY HISTORY :   Family History  Problem Relation Age of Onset  . COPD Mother   . CAD Mother   . Heart attack Father   . Prostate cancer Maternal Grandfather   . Kidney cancer Neg Hx   . Bladder Cancer Neg Hx     SOCIAL HISTORY:   Social History   Tobacco Use  . Smoking status: Current Every Day Smoker    Packs/day: 0.50    Years: 40.00    Pack years: 20.00    Types: Cigarettes  . Smokeless tobacco: Never Used  Vaping Use  . Vaping Use: Never used  Substance Use Topics  . Alcohol use: No    Alcohol/week: 0.0 standard drinks  . Drug use: No    ALLERGIES:  has No Known Allergies.  MEDICATIONS:  Current Outpatient Medications  Medication Sig Dispense Refill  . acetaminophen (TYLENOL) 325 MG tablet Take 2 tablets (650 mg total) by mouth every 6 (six) hours as needed for mild pain (or Fever >/= 101). (Patient taking differently: Take 650 mg by mouth every 6 (six) hours as needed for mild pain (or Fever >/= 101). )    . albuterol (PROAIR HFA) 108 (90 Base) MCG/ACT inhaler Inhale 2 puffs into the lungs every 6 (six) hours as needed for wheezing or shortness of breath. 8.5 g 0  . aspirin EC 81 MG EC tablet Take 1 tablet (81 mg total) by mouth daily.    . Buprenorphine (BUTRANS) 15 MCG/HR PTWK Place 1 patch onto the skin once a week.    . dexamethasone (DECADRON) 4 MG tablet TAKE THREE TABLETS BY MOUTH ONCE A WEEK IN MORNING WITH BREAKFAST 30 tablet 4  . DULoxetine (CYMBALTA)  30 MG capsule Take 1 capsule by mouth daily before breakfast.    . Fluticasone-Salmeterol (ADVAIR DISKUS) 250-50 MCG/DOSE AEPB Inhale 1 puff into the lungs 2 (two) times daily. 3 each 2  . ipratropium-albuterol (DUONEB) 0.5-2.5 (3) MG/3ML SOLN Take 3 mLs by nebulization every 4 (four) hours as needed. 360 mL 3  . metoprolol tartrate (LOPRESSOR) 50 MG tablet Take 50 mg by mouth daily.     . neomycin-polymyxin b-dexamethasone (MAXITROL) 3.5-10000-0.1 OINT SMARTSIG:1 Application Right Eye 4 Times Daily PRN    . neomycin-polymyxin-dexameth (MAXITROL) 0.1 % OINT Administer 1 application to the right eye 4 (four) times a day as needed.    . NINLARO 3 MG capsule TAKE 1 CAPSULE (3 MG TOTAL) BY MOUTH EVERY WEDNESDAY. TAKE FOR 3 WEEKS ON, THEN 1 WEEK OFF. TAKE ON AN EMPTY STOMACH 1HR BEFORE OR 2HRS AFTER MEALS 3 capsule 4  . OXYGEN Inhale 3 L into the lungs continuous.    . polyethylene glycol powder (GLYCOLAX/MIRALAX) 17 GM/SCOOP powder Take 17   g by mouth daily.    . potassium chloride SA (KLOR-CON) 20 MEQ tablet Take 1 tablet by mouth daily.    . prednisoLONE acetate (PRED FORTE) 1 % ophthalmic suspension Administer 1 drop to the right eye Four (4) times a day.    . tiotropium (SPIRIVA HANDIHALER) 18 MCG inhalation capsule Place 1 capsule (18 mcg total) into inhaler and inhale daily. 30 capsule 6  . amoxicillin-clavulanate (AUGMENTIN) 875-125 MG tablet Take 1 tablet by mouth 2 (two) times daily. 20 tablet 0  . gabapentin (NEURONTIN) 400 MG capsule TAKE TWO CAPSULES BY MOUTH EVERY MORNING, ONE CAPSULE IN THE AFTERNOON & TWO CAPSULES EVERY EVENING 150 capsule 2  . naloxone (NARCAN) nasal spray 4 mg/0.1 mL For Opioid Overdose: Call 911 and administer a single spray of Narcan in one nostril. Repeat every 77mns as needed if no or minimal response (Patient not taking: Reported on 08/08/2019) 1 kit 2  . ondansetron (ZOFRAN) 8 MG tablet TAKE ONE TABLET BY MOUTH EVERY 8 HOURS AS NEEDED 60 tablet 3  . predniSONE  (DELTASONE) 20 MG tablet Take 1 tablet (20 mg total) by mouth daily. (Patient not taking: Reported on 10/15/2019) 10 tablet 0  . predniSONE (DELTASONE) 20 MG tablet Take 1 tablet (20 mg total) by mouth daily. 5 tablet 0  . predniSONE (DELTASONE) 20 MG tablet Take 2 tablets (40 mg total) by mouth daily. 20 tablet 0  . sertraline (ZOLOFT) 50 MG tablet TAKE ONE TABLET BY MOUTH ONCE DAILY 30 tablet 6   No current facility-administered medications for this visit.    PHYSICAL EXAMINATION: ECOG PERFORMANCE STATUS: 2 - Symptomatic, <50% confined to bed  BP 109/79 (BP Location: Left Arm, Patient Position: Sitting, Cuff Size: Normal)   Pulse (!) 105   Temp (!) 97.4 F (36.3 C) (Tympanic)   Resp 20   Ht 6' (1.829 m)   Wt 119 lb (54 kg)   SpO2 97%   BMI 16.14 kg/m   Filed Weights   10/15/19 1512  Weight: 119 lb (54 kg)    Physical Exam Constitutional:      Comments: Cachectic appearing Caucasian male patient; on room air.  He is alone.  He is walking by himself.  HENT:     Head: Normocephalic and atraumatic.     Mouth/Throat:     Pharynx: No oropharyngeal exudate.  Eyes:     Pupils: Pupils are equal, round, and reactive to light.  Cardiovascular:     Rate and Rhythm: Normal rate and regular rhythm.  Pulmonary:     Effort: No respiratory distress.     Comments: Decreased breath sounds bil; right sided wheezing.  Abdominal:     General: Bowel sounds are normal. There is no distension.     Palpations: Abdomen is soft. There is no mass.     Tenderness: There is no abdominal tenderness. There is no guarding or rebound.  Musculoskeletal:        General: No tenderness. Normal range of motion.     Cervical back: Normal range of motion and neck supple.  Skin:    General: Skin is warm.  Neurological:     Mental Status: He is alert and oriented to person, place, and time.  Psychiatric:        Mood and Affect: Affect normal.        LABORATORY DATA:  I have reviewed the data as  listed    Component Value Date/Time   NA 139 10/26/2019 1636  NA 135 05/12/2014 1343   K 3.9 10/26/2019 1636   K 3.3 (L) 05/12/2014 1343   CL 96 (L) 10/26/2019 1636   CL 101 05/12/2014 1343   CO2 35 (H) 10/26/2019 1636   CO2 27 05/12/2014 1343   GLUCOSE 153 (H) 10/26/2019 1636   GLUCOSE 77 05/12/2014 1343   BUN 10 10/26/2019 1636   BUN 9 05/12/2014 1343   CREATININE 0.75 10/26/2019 1636   CREATININE 0.89 05/12/2014 1343   CALCIUM 8.5 (L) 10/26/2019 1636   CALCIUM 8.8 (L) 05/12/2014 1343   PROT 6.6 10/26/2019 1636   PROT 5.9 (L) 02/17/2014 1405   ALBUMIN 2.7 (L) 10/26/2019 1636   ALBUMIN 2.8 (L) 02/17/2014 1405   AST 13 (L) 10/26/2019 1636   AST 9 (L) 02/17/2014 1405   ALT 9 10/26/2019 1636   ALT 12 (L) 02/17/2014 1405   ALKPHOS 65 10/26/2019 1636   ALKPHOS 44 (L) 02/17/2014 1405   BILITOT 0.6 10/26/2019 1636   BILITOT 0.2 02/17/2014 1405   GFRNONAA >60 10/26/2019 1636   GFRNONAA >60 05/12/2014 1343   GFRAA >60 10/26/2019 1636   GFRAA >60 05/12/2014 1343    No results found for: SPEP, UPEP  Lab Results  Component Value Date   WBC 16.6 (H) 10/26/2019   NEUTROABS 14.2 (H) 10/26/2019   HGB 12.8 (L) 10/26/2019   HCT 41.8 10/26/2019   MCV 87.1 10/26/2019   PLT 363 10/26/2019      Chemistry      Component Value Date/Time   NA 139 10/26/2019 1636   NA 135 05/12/2014 1343   K 3.9 10/26/2019 1636   K 3.3 (L) 05/12/2014 1343   CL 96 (L) 10/26/2019 1636   CL 101 05/12/2014 1343   CO2 35 (H) 10/26/2019 1636   CO2 27 05/12/2014 1343   BUN 10 10/26/2019 1636   BUN 9 05/12/2014 1343   CREATININE 0.75 10/26/2019 1636   CREATININE 0.89 05/12/2014 1343      Component Value Date/Time   CALCIUM 8.5 (L) 10/26/2019 1636   CALCIUM 8.8 (L) 05/12/2014 1343   ALKPHOS 65 10/26/2019 1636   ALKPHOS 44 (L) 02/17/2014 1405   AST 13 (L) 10/26/2019 1636   AST 9 (L) 02/17/2014 1405   ALT 9 10/26/2019 1636   ALT 12 (L) 02/17/2014 1405   BILITOT 0.6 10/26/2019 1636   BILITOT  0.2 02/17/2014 1405       RADIOGRAPHIC STUDIES: I have personally reviewed the radiological images as listed and agreed with the findings in the report. No results found.   ASSESSMENT & PLAN:  Multiple myeloma in relapse (HCC) # MULTIPLE MYELOMA-AUG 2019 recurrent;   Oct 2019- PET- no lytic lesions noted.  Clinically STABLE.   # Currently on Ninlaro-Dex [since 01/02/2018]- PR noted; AUG 2021-0.3 Kappa light chains-slightly abnormal- 2.17.  Continue Ninlaro-Dex weekly.  # Leucocytosis/neutrophilia- 21 [dex- yesterday]- no signs of infection; monitor for now.   #Cough shortness of breath-wheezing/COPD exacerbation; leukocytosis likely secondary steroids-recommend chest x-ray; again reminded to quit smoking.  # Chronic nausea-question secondary to Ninlaro.STABLE.   # Chronic pain-STABLE- currently on buprenorphine 20 mg pain patch once a week; pain management];   # Neuropathy-STABLE; Neurontin 2 pill BID; 1 in AF-STABLE.   # Depression: Continue Zoloft -STABLE.   # Bilateral subcentimeter lung nodules -CT scan February 2021-stable lung nodules; follow-up CT in 12 months.STABLE.   # DISPOSITION: # follow up in 1 month-MD cbc/cmp/MMpanel/K-L light chain;- Dr.B   Orders Placed This Encounter  Procedures  .   CBC with Differential/Platelet    Standing Status:   Future    Standing Expiration Date:   10/14/2020  . Comprehensive metabolic panel    Standing Status:   Future    Standing Expiration Date:   10/14/2020  . Multiple Myeloma Panel (SPEP&IFE w/QIG)    Standing Status:   Future    Standing Expiration Date:   10/14/2020  . Kappa/lambda light chains    Standing Status:   Future    Standing Expiration Date:   10/14/2020   All questions were answered. The patient knows to call the clinic with any problems, questions or concerns.       R , MD 01/16/2020 6:47 AM  

## 2019-10-15 NOTE — Assessment & Plan Note (Signed)
#  MULTIPLE MYELOMA-AUG 2019 recurrent;   Oct 2019- PET- no lytic lesions noted.  Clinically STABLE.   # Currently on Ninlaro-Dex [since 01/02/2018]- PR noted; AUG 2021-0.3 Kappa light chains-slightly abnormal- 2.17.  Continue Ninlaro-Dex weekly.  # Leucocytosis/neutrophilia- 21 [dex- yesterday]- no signs of infection; monitor for now.   #Cough shortness of breath-wheezing/COPD exacerbation; leukocytosis likely secondary steroids-recommend chest x-ray; again reminded to quit smoking.  # Chronic nausea-question secondary to Ninlaro.STABLE.   # Chronic pain-STABLE- currently on buprenorphine 20 mg pain patch once a week; pain management];   # Neuropathy-STABLE; Neurontin 2 pill BID; 1 in AF-STABLE.   # Depression: Continue Zoloft -STABLE.   # Bilateral subcentimeter lung nodules -CT scan February 2021-stable lung nodules; follow-up CT in 12 months.STABLE.   # DISPOSITION: # follow up in 1 month-MD cbc/cmp/MMpanel/K-L light chain;- Dr.B

## 2019-10-16 LAB — KAPPA/LAMBDA LIGHT CHAINS
Kappa free light chain: 23 mg/L — ABNORMAL HIGH (ref 3.3–19.4)
Kappa, lambda light chain ratio: 2.19 — ABNORMAL HIGH (ref 0.26–1.65)
Lambda free light chains: 10.5 mg/L (ref 5.7–26.3)

## 2019-10-17 LAB — MULTIPLE MYELOMA PANEL, SERUM
Albumin SerPl Elph-Mcnc: 3.4 g/dL (ref 2.9–4.4)
Albumin/Glob SerPl: 1.2 (ref 0.7–1.7)
Alpha 1: 0.5 g/dL — ABNORMAL HIGH (ref 0.0–0.4)
Alpha2 Glob SerPl Elph-Mcnc: 1 g/dL (ref 0.4–1.0)
B-Globulin SerPl Elph-Mcnc: 0.8 g/dL (ref 0.7–1.3)
Gamma Glob SerPl Elph-Mcnc: 0.7 g/dL (ref 0.4–1.8)
Globulin, Total: 3 g/dL (ref 2.2–3.9)
IgA: 78 mg/dL — ABNORMAL LOW (ref 90–386)
IgG (Immunoglobin G), Serum: 806 mg/dL (ref 603–1613)
IgM (Immunoglobulin M), Srm: 61 mg/dL (ref 20–172)
M Protein SerPl Elph-Mcnc: 0.3 g/dL — ABNORMAL HIGH
Total Protein ELP: 6.4 g/dL (ref 6.0–8.5)

## 2019-10-26 ENCOUNTER — Other Ambulatory Visit: Payer: Self-pay

## 2019-10-26 ENCOUNTER — Emergency Department
Admission: EM | Admit: 2019-10-26 | Discharge: 2019-10-26 | Disposition: A | Discharge status: Home / Self Care | Payer: Medicaid Other | Attending: Emergency Medicine | Admitting: Emergency Medicine

## 2019-10-26 ENCOUNTER — Encounter: Payer: Self-pay | Admitting: Emergency Medicine

## 2019-10-26 ENCOUNTER — Emergency Department: Payer: Medicaid Other

## 2019-10-26 DIAGNOSIS — F17210 Nicotine dependence, cigarettes, uncomplicated: Secondary | ICD-10-CM | POA: Diagnosis not present

## 2019-10-26 DIAGNOSIS — Z950 Presence of cardiac pacemaker: Secondary | ICD-10-CM | POA: Diagnosis not present

## 2019-10-26 DIAGNOSIS — I509 Heart failure, unspecified: Secondary | ICD-10-CM | POA: Diagnosis not present

## 2019-10-26 DIAGNOSIS — I2510 Atherosclerotic heart disease of native coronary artery without angina pectoris: Secondary | ICD-10-CM | POA: Insufficient documentation

## 2019-10-26 DIAGNOSIS — Z20822 Contact with and (suspected) exposure to covid-19: Secondary | ICD-10-CM | POA: Diagnosis not present

## 2019-10-26 DIAGNOSIS — Z7982 Long term (current) use of aspirin: Secondary | ICD-10-CM | POA: Insufficient documentation

## 2019-10-26 DIAGNOSIS — Z79899 Other long term (current) drug therapy: Secondary | ICD-10-CM | POA: Insufficient documentation

## 2019-10-26 DIAGNOSIS — Z7951 Long term (current) use of inhaled steroids: Secondary | ICD-10-CM | POA: Diagnosis not present

## 2019-10-26 DIAGNOSIS — J441 Chronic obstructive pulmonary disease with (acute) exacerbation: Secondary | ICD-10-CM

## 2019-10-26 DIAGNOSIS — R0602 Shortness of breath: Secondary | ICD-10-CM | POA: Diagnosis present

## 2019-10-26 LAB — CBC WITH DIFFERENTIAL/PLATELET
Abs Immature Granulocytes: 0.11 10*3/uL — ABNORMAL HIGH (ref 0.00–0.07)
Basophils Absolute: 0 10*3/uL (ref 0.0–0.1)
Basophils Relative: 0 %
Eosinophils Absolute: 0 10*3/uL (ref 0.0–0.5)
Eosinophils Relative: 0 %
HCT: 41.8 % (ref 39.0–52.0)
Hemoglobin: 12.8 g/dL — ABNORMAL LOW (ref 13.0–17.0)
Immature Granulocytes: 1 %
Lymphocytes Relative: 7 %
Lymphs Abs: 1.2 10*3/uL (ref 0.7–4.0)
MCH: 26.7 pg (ref 26.0–34.0)
MCHC: 30.6 g/dL (ref 30.0–36.0)
MCV: 87.1 fL (ref 80.0–100.0)
Monocytes Absolute: 1.1 10*3/uL — ABNORMAL HIGH (ref 0.1–1.0)
Monocytes Relative: 7 %
Neutro Abs: 14.2 10*3/uL — ABNORMAL HIGH (ref 1.7–7.7)
Neutrophils Relative %: 85 %
Platelets: 363 10*3/uL (ref 150–400)
RBC: 4.8 MIL/uL (ref 4.22–5.81)
RDW: 16.6 % — ABNORMAL HIGH (ref 11.5–15.5)
WBC: 16.6 10*3/uL — ABNORMAL HIGH (ref 4.0–10.5)
nRBC: 0 % (ref 0.0–0.2)

## 2019-10-26 LAB — COMPREHENSIVE METABOLIC PANEL
ALT: 9 U/L (ref 0–44)
AST: 13 U/L — ABNORMAL LOW (ref 15–41)
Albumin: 2.7 g/dL — ABNORMAL LOW (ref 3.5–5.0)
Alkaline Phosphatase: 65 U/L (ref 38–126)
Anion gap: 8 (ref 5–15)
BUN: 10 mg/dL (ref 6–20)
CO2: 35 mmol/L — ABNORMAL HIGH (ref 22–32)
Calcium: 8.5 mg/dL — ABNORMAL LOW (ref 8.9–10.3)
Chloride: 96 mmol/L — ABNORMAL LOW (ref 98–111)
Creatinine, Ser: 0.75 mg/dL (ref 0.61–1.24)
GFR calc Af Amer: >60 mL/min (ref >60)
GFR calc non Af Amer: >60 mL/min (ref >60)
Glucose, Bld: 153 mg/dL — ABNORMAL HIGH (ref 70–99)
Potassium: 3.9 mmol/L (ref 3.5–5.1)
Sodium: 139 mmol/L (ref 135–145)
Total Bilirubin: 0.6 mg/dL (ref 0.3–1.2)
Total Protein: 6.6 g/dL (ref 6.5–8.1)

## 2019-10-26 LAB — TROPONIN I (HIGH SENSITIVITY)
Troponin I (High Sensitivity): 19 ng/L — ABNORMAL HIGH (ref <18)
Troponin I (High Sensitivity): 18 ng/L — ABNORMAL HIGH (ref <18)

## 2019-10-26 LAB — RESPIRATORY PANEL BY RT PCR (FLU A&B, COVID)
Influenza A by PCR: NEGATIVE
Influenza B by PCR: NEGATIVE
SARS Coronavirus 2 by RT PCR: NEGATIVE

## 2019-10-26 LAB — BRAIN NATRIURETIC PEPTIDE: B Natriuretic Peptide: 318.4 pg/mL — ABNORMAL HIGH (ref 0.0–100.0)

## 2019-10-26 MED ORDER — PREDNISONE 20 MG PO TABS: 20.0000 mg | ORAL_TABLET | Freq: Every day | ORAL | 0 refills | Status: DC

## 2019-10-26 MED ORDER — AZITHROMYCIN 250 MG PO TABS: ORAL_TABLET | ORAL | 0 refills | Status: AC

## 2019-10-26 MED ORDER — PREDNISONE 20 MG PO TABS
60.0000 mg | ORAL_TABLET | Freq: Once | ORAL | Status: AC
  Administered 2019-10-26: 60 mg via ORAL
  Filled 2019-10-26: qty 3

## 2019-10-26 MED ORDER — IPRATROPIUM-ALBUTEROL 0.5-2.5 (3) MG/3ML IN SOLN
3.0000 mL | Freq: Once | RESPIRATORY_TRACT | Status: AC
  Administered 2019-10-26: 3 mL via RESPIRATORY_TRACT
  Filled 2019-10-26: qty 3

## 2019-10-26 MED ORDER — AZITHROMYCIN 500 MG PO TABS
500.0000 mg | ORAL_TABLET | Freq: Once | ORAL | Status: AC
  Administered 2019-10-26: 500 mg via ORAL
  Filled 2019-10-26: qty 1

## 2019-10-26 NOTE — ED Triage Notes (Signed)
Pt presents via [facility] with c/o worsening shortness of breath. Pt has hx of copd, chronically on 3L. Pt 87% on 3L. Pt given 2 duoneb, 125 solumedrol, and 2g magnesium. Pt respirations even but labored at this time. EMS reports pt having runs of Vtach on monitor.

## 2019-10-26 NOTE — ED Notes (Signed)
E-signature not working at this time. Pt verbalized understanding of D/C instructions, prescriptions and follow up care with no further questions at this time. Pt in NAD and ambulatory at time of D/C.  

## 2019-10-26 NOTE — ED Provider Notes (Signed)
[facility] Emergency Department Provider Note   ____________________________________________   First MD Initiated Contact with Patient 10/26/19 1637     (approximate)  I have reviewed the triage vital signs and the nursing notes.   HISTORY  Chief Complaint Shortness of Breath    HPI [name] is a 59 y.o. male who has a history of COPD and CHF.  He reports about 4 weeks ago he began to get sick with a cough productive of some white phlegm and improved and in normal days ago got worse again.  He is now coughing up a lot of thick white phlegm.  He is not running a fever.  His more short of breath and wheezing and seems like his COPD is acting up.  Usually is on 3 L of oxygen and today when EMS got there his O2 sats were below 90 on his usual 3 L..   Patient had several runs of 4-5 beats of V. tach and then developed like bigeminy after breathing treatments and oxygen brought his sats up above 90.  Currently he is sitting in bed speaking in full sentences but still retracting.  His O2 sats on 3 L about 92.      Past Medical History:  Diagnosis Date  . Anxiety   . Arthritis   . Atrial septal defect   . CHF (congestive heart failure) ([place])   . COPD (chronic obstructive pulmonary disease) (HCC)    on 3L O2 - continuous  . Coronary artery disease   . CVA (cerebral vascular accident) ([place]) 02/2018   "mild" - no deficits  . Depression   . Dyspnea    with exertion  . Dysrhythmia    nonsustained VT, Afib  . Dysrhythmias   . Environmental and seasonal allergies   . GERD (gastroesophageal reflux disease)   . Hyperlipidemia   . 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 Malnourished ([place])   . Multiple myeloma (HCC)    multiple myeloma  . Myocardial infarction ([place])   . Neuropathy    feet, legs, hands  . Presence of permanent cardiac pacemaker    defib pacemaker  . Substance abuse [facility])      Patient Active Problem List   Diagnosis Date Noted  . Chronic respiratory failure with hypoxia ([place]) 03/25/2019  . Abnormal CT of the chest 03/25/2019  . Palliative care encounter   . Altered mental status, unspecified 03/08/2018  . CVA (cerebral vascular accident) ([place]) 03/08/2018  . Gastroenteritis 02/15/2018  . Fatigue due to treatment 09/07/2016  . Palliative care by specialist   . Advance care planning   . Other insomnia   . Protein-calorie malnutrition, severe 05/25/2016  . Aspiration pneumonia ([place]) 05/23/2016  . Cough in adult 05/05/2016  . Multiple myeloma in relapse ([place]) 09/09/2015  . Congestive heart failure ([place]) 01/06/2015  . Cardiomyopathy ([place]) 01/06/2015  . Nonsustained ventricular tachycardia ([place]) 01/06/2015  . Malnutrition of moderate degree 12/24/2014  . Pressure ulcer 12/24/2014  . Cellulitis of second finger of left hand   . Sepsis ([place]) 12/23/2014  . 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 CAD (coronary artery disease) 08/22/2011  . Cardiac conduction disorder 08/22/2011  . Myocardial infarction ([place]) 08/22/2011  . Cardiac arrhythmia 08/22/2011  . Anxiety 08/21/2011  . GERD (gastroesophageal reflux disease) 08/21/2011  . Chronic obstructive pulmonary disease ([place]) 07/26/2011  . Nicotine addiction 07/26/2011    Past Surgical History:  Procedure Laterality  Date  . CARDIAC DEFIBRILLATOR PLACEMENT  08/22/2011   [employer] single chamber placed by Dr Marchia Meiers, [facility]  . CATARACT EXTRACTION W/PHACO Right 12/13/2018   Procedure: CATARACT EXTRACTION PHACO AND INTRAOCULAR LENS PLACEMENT ([place]) RIGHT;  Surgeon: Birder Robson, MD;  Location: [facility] ORS;  Service: Ophthalmology;  Laterality: Right;  [place] 01:21.4 CDE 15.56 FLUID PACK LOT # [id number]  . CATARACT EXTRACTION W/PHACO Left 01/23/2019   Procedure: CATARACT EXTRACTION PHACO AND INTRAOCULAR LENS PLACEMENT ([place]) LEFT;  Surgeon: Birder Robson, MD;   Location: [facility] ORS;  Service: Ophthalmology;  Laterality: Left;  [place] 01:13.5 CDE 13.33 Fluid Pack lot # [id number]  . TOOTH EXTRACTION      Prior to Admission medications   Medication Sig Start Date End Date Taking? Authorizing Provider  acetaminophen (TYLENOL) 325 MG tablet Take 2 tablets (650 mg total) by mouth every 6 (six) hours as needed for mild pain (or Fever >/= 101). Patient taking differently: Take 650 mg by mouth every 6 (six) hours as needed for mild pain (or Fever >/= 101).  05/28/16   Gouru, Illene Silver, MD  albuterol (PROAIR HFA) 108 (90 Base) MCG/ACT inhaler Inhale 2 puffs into the lungs every 6 (six) hours as needed for wheezing or shortness of breath. 09/26/19   Flora Lipps, MD  aspirin EC 81 MG EC tablet Take 1 tablet (81 mg total) by mouth daily. 03/13/18   Hillary Bow, MD  azithromycin (ZITHROMAX Z-PAK) 250 MG tablet Take 2 tablets (500 mg) on  Day 1,  followed by 1 tablet (250 mg) once daily on Days 2 through 5. 10/26/19 10/31/19  Nena Polio, MD  Buprenorphine (BUTRANS) 15 MCG/HR [place] [facility] 1 patch onto the skin once a week.    [provider]  dexamethasone (DECADRON) 4 MG tablet TAKE THREE TABLETS BY MOUTH ONCE A WEEK IN MORNING WITH BREAKFAST 04/11/19   Borders, Kirt Boys, NP  DULoxetine (CYMBALTA) 30 MG capsule Take 1 capsule by mouth daily before breakfast. 03/29/18   [provider]  Fluticasone-Salmeterol (ADVAIR DISKUS) 250-50 MCG/DOSE AEPB Inhale 1 puff into the lungs 2 (two) times daily. 05/22/18 08/08/23  Flora Lipps, MD  gabapentin (NEURONTIN) 400 MG capsule TAKE TWO CAPSULES BY MOUTH EVERY MORNING, ONE CAPSULE IN THE AFTERNOON & TWO CAPSULES EVERY EVENING 08/26/19   Cammie Sickle, MD  ipratropium-albuterol (DUONEB) 0.5-2.5 (3) MG/3ML SOLN Take 3 mLs by nebulization every 4 (four) hours as needed. 05/22/18   Flora Lipps, MD  metoprolol tartrate (LOPRESSOR) 50 MG tablet Take 50 mg by mouth daily.  10/02/18   [provider]  naloxone [facility])  nasal spray 4 mg/0.1 mL For Opioid Overdose: Call 911 and administer a single spray of Narcan in one nostril. Repeat every 56mns as needed if no or minimal response Patient not taking: Reported on 08/08/2019 02/20/18   BCammie Sickle MD  neomycin-polymyxin b-dexamethasone (MAXITROL) 3.5-10000-0.1 OINT SMARTSIG:1 Application Right Eye 4 Times Daily PRN 09/24/19   [provider]  neomycin-polymyxin-dexameth (MAXITROL) 0.1 % OINT Administer 1 application to the right eye 4 (four) times a day as needed. 09/24/19   [provider]  NINLARO 3 MG capsule TAKE 1 CAPSULE (3 MG TOTAL) BY MOUTH EVERY WEDNESDAY. TAKE FOR 3 WEEKS ON, THEN 1 WEEK OFF. TAKE ON AN EMPTY STOMACH 1HR BEFORE OR 2HRS AFTER MEALS Patient not taking: Reported on 08/08/2019 06/16/19   BCammie Sickle MD  ondansetron (ZOFRAN) 8 MG tablet TAKE ONE TABLET BY MOUTH EVERY 8 HOURS AS NEEDED  08/13/18   Cammie Sickle, MD  OXYGEN Inhale 3 L into the lungs continuous.    [provider]  polyethylene glycol powder (GLYCOLAX/MIRALAX) 17 GM/SCOOP powder Take 17 g by mouth daily.    [provider]  potassium chloride SA (KLOR-CON) 20 MEQ tablet Take 1 tablet by mouth daily.    [provider]  prednisoLONE acetate (PRED FORTE) 1 % ophthalmic suspension Administer 1 drop to the right eye Four (4) times a day. 09/24/19   [provider]  predniSONE (DELTASONE) 20 MG tablet Take 1 tablet (20 mg total) by mouth daily. Patient not taking: Reported on 10/15/2019 08/18/19 08/17/20  Flora Lipps, MD  predniSONE (DELTASONE) 20 MG tablet Take 1 tablet (20 mg total) by mouth daily. 10/26/19 10/25/20  Nena Polio, MD  sertraline (ZOLOFT) 50 MG tablet Take 1 tablet (50 mg total) by mouth daily. 06/30/19   Cammie Sickle, MD  tiotropium (SPIRIVA HANDIHALER) 18 MCG inhalation capsule Place 1 capsule (18 mcg total) into inhaler and inhale daily. 05/22/18 08/08/23  Flora Lipps, MD     Allergies Patient has no known allergies.  Family History  Problem Relation Age of Onset  . COPD Mother   . CAD Mother   . Heart attack Father   . Prostate cancer Maternal Grandfather   . Kidney cancer Neg Hx   . Bladder Cancer Neg Hx     Social History Social History   Tobacco Use  . Smoking status: Current Every Day Smoker    Packs/day: 0.50    Years: 40.00    Pack years: 20.00    Types: Cigarettes  . Smokeless tobacco: Never Used  Vaping Use  . Vaping Use: Never used  Substance Use Topics  . Alcohol use: No    Alcohol/week: 0.0 standard drinks  . Drug use: No    Review of Systems  Constitutional: No fever/chills Eyes: No visual changes. ENT: No sore throat. Cardiovascular: Denies chest pain. Respiratory:shortness of breath. Gastrointestinal: No abdominal pain.  No nausea, no vomiting.  No diarrhea.  No constipation. Genitourinary: Negative for dysuria. Musculoskeletal: Negative for back pain. Skin: Negative for rash. Neurological: Negative for headaches, focal weakness   ____________________________________________   PHYSICAL EXAM:  VITAL SIGNS: ED Triage Vitals  Enc Vitals Group     BP      Pulse      Resp      Temp      Temp src      SpO2      Weight      Height      Head Circumference      Peak Flow      Pain Score      Pain Loc      Pain Edu?      Excl. in [place]?     Constitutional: Alert and oriented. Well appearing and in no acute distress. Eyes: Conjunctivae are normal. PER Head: Atraumatic. Nose: No congestion/rhinnorhea. Mouth/Throat: Mucous membranes are moist.  Oropharynx non-erythematous. Neck: No stridor.   Cardiovascular: Normal rate, regular rhythm. Grossly normal heart sounds.  Good peripheral circulation. Respiratory: Increased respiratory effort.  retractions. Lungs scattered wheezes Gastrointestinal: Soft and nontender. No distention. No abdominal bruits. No CVA tenderness. Musculoskeletal: No lower extremity  tenderness nor edema.   Neurologic:  Normal speech and language. No gross focal neurologic deficits are appreciated. No gait instability. Skin:  Skin is warm, dry and intact. No rash noted.   ____________________________________________   LABS (all  labs ordered are listed, but only abnormal results are displayed)  Labs Reviewed  COMPREHENSIVE METABOLIC PANEL - Abnormal; Notable for the following components:      Result Value   Chloride 96 (*)    CO2 35 (*)    Glucose, Bld 153 (*)    Calcium 8.5 (*)    Albumin 2.7 (*)    AST 13 (*)    All other components within normal limits  BRAIN NATRIURETIC PEPTIDE - Abnormal; Notable for the following components:   B Natriuretic Peptide 318.4 (*)    All other components within normal limits  CBC WITH DIFFERENTIAL/PLATELET - Abnormal; Notable for the following components:   WBC 16.6 (*)    Hemoglobin 12.8 (*)    RDW 16.6 (*)    Neutro Abs 14.2 (*)    Monocytes Absolute 1.1 (*)    Abs Immature Granulocytes 0.11 (*)    All other components within normal limits  TROPONIN I (HIGH SENSITIVITY) - Abnormal; Notable for the following components:   Troponin I (High Sensitivity) 19 (*)    All other components within normal limits  TROPONIN I (HIGH SENSITIVITY) - Abnormal; Notable for the following components:   Troponin I (High Sensitivity) 18 (*)    All other components within normal limits  RESPIRATORY PANEL BY RT PCR (FLU A&B, COVID)   ____________________________________________  EKG  EKG read interpreted by me shows sinus tachycardia 110 normal axis right bundle branch block probable right atrial dilatation there are some PVCs present no acute ST-T wave changes ____________________________________________  RADIOLOGY Gertha Calkin, personally viewed and evaluated these images (plain radiographs) as part of my medical decision making, as well as reviewing the written report by the radiologist. Chest x-ray reviewed by me shows COPD  with a pacer defibrillator in place.  I do not see any signs of pneumonia etc.  I am waiting for the radiologist reading. ED MD interpretation   Official radiology report(s): DG Chest Portable 1 View  Result Date: 10/26/2019 CLINICAL DATA:  Shortness of breath. EXAM: PORTABLE CHEST 1 VIEW COMPARISON:  February 05, 2019 FINDINGS: The lungs are hyperinflated. A single lead ventricular pacer is noted. Diffuse chronic appearing increased interstitial lung markings are seen. There is no evidence of acute infiltrate, pleural effusion or pneumothorax. The heart size and mediastinal contours are within normal limits. The visualized skeletal structures are unremarkable. IMPRESSION: 1. Findings consistent with COPD. 2. No acute or active cardiopulmonary disease. Electronically Signed   By: Virgina Norfolk M.D.   On: 10/26/2019 17:07    ____________________________________________   PROCEDURES  Procedure(s) performed (including Critical Care):  Procedures   ____________________________________________   INITIAL IMPRESSION / ASSESSMENT AND PLAN / ED COURSE  Feeling well on discharge he can walk without desaturating.  We'll let him go with Zithromax and prednisone as he wished.  He will return for any further problems.  There is no obvious pneumonia on chest x-ray no sign of Covid no history of what sounds like pulmonary embolus would be a problem.  His brief runs of V. tach have resolved with the improvement of his oxygenation and his PVCs have decreased dramatically.  Believe he is stable to be discharged            ____________________________________________   FINAL CLINICAL IMPRESSION(S) / ED DIAGNOSES  Final diagnoses:  COPD exacerbation [facility])     ED Discharge Orders         Ordered    azithromycin (ZITHROMAX Z-PAK) 250  MG tablet        10/26/19 1936    predniSONE (DELTASONE) 20 MG tablet  Daily        10/26/19 1936          *Please note:  [name] was evaluated  in Emergency Department on 10/26/2019 for the symptoms described in the history of present illness. He was evaluated in the context of the global COVID-19 pandemic, which necessitated consideration that the patient might be at risk for infection with the SARS-CoV-2 virus that causes COVID-19. Institutional protocols and algorithms that pertain to the evaluation of patients at risk for COVID-19 are in a state of rapid change based on information released by regulatory bodies including the CDC and federal and state organizations. These policies and algorithms were followed during the patient's care in the ED.  Some ED evaluations and interventions may be delayed as a result of limited staffing during and the pandemic.*   Note:  This document was prepared using Dragon voice recognition software and may include unintentional dictation errors.    Nena Polio, MD 10/26/19 [phone]

## 2019-10-26 NOTE — Discharge Instructions (Addendum)
Please take the Zithromax Z-Pak as directed.  We'll give you a dose here in the emergency room before you leave.  Please take the prednisone as directed as well we'll give you a dose of that in the emergency room as well.  You can start both of them in the late afternoon tomorrow since you will have gotten the doses here.  Please return for fever increasing shortness of breath chest pain or any other complaints.

## 2019-11-03 ENCOUNTER — Other Ambulatory Visit: Payer: Self-pay | Admitting: Internal Medicine

## 2019-11-03 ENCOUNTER — Other Ambulatory Visit (HOSPITAL_COMMUNITY): Payer: Self-pay | Admitting: Internal Medicine

## 2019-11-03 DIAGNOSIS — C9002 Multiple myeloma in relapse: Secondary | ICD-10-CM

## 2019-11-04 ENCOUNTER — Other Ambulatory Visit: Payer: Self-pay | Admitting: *Deleted

## 2019-11-04 MED ORDER — ONDANSETRON HCL 8 MG PO TABS: ORAL_TABLET | ORAL | 3 refills | Status: AC

## 2019-11-06 MED FILL — NINLARO 3 MG CAPS: 3 | 28 days supply | Qty: 3 | Fill #0

## 2019-11-12 ENCOUNTER — Inpatient Hospital Stay: Payer: Medicaid Other

## 2019-11-12 ENCOUNTER — Inpatient Hospital Stay: Payer: Medicaid Other | Admitting: Internal Medicine

## 2019-11-19 ENCOUNTER — Inpatient Hospital Stay: Payer: Medicaid Other | Admitting: Internal Medicine

## 2019-11-19 ENCOUNTER — Inpatient Hospital Stay: Payer: Medicaid Other | Attending: Internal Medicine

## 2019-11-21 ENCOUNTER — Other Ambulatory Visit: Payer: Self-pay | Admitting: Internal Medicine

## 2019-11-28 ENCOUNTER — Telehealth: Payer: Self-pay

## 2019-11-28 NOTE — Telephone Encounter (Signed)
Called patient to schedule appointment. He is over due for his appointment. No answer, left voicemail for him to call back to schedule.     [facility] sent fax requesting office visit notes for after 06/05/19 but patient has not been seen since 03/25/19.

## 2019-12-03 MED FILL — NINLARO 3 MG CAPS: 3 | 28 days supply | Qty: 3 | Fill #1

## 2019-12-26 ENCOUNTER — Other Ambulatory Visit: Payer: Self-pay | Admitting: Internal Medicine

## 2020-01-01 MED FILL — NINLARO 3 MG CAPS: 3 | 28 days supply | Qty: 3 | Fill #2

## 2020-01-05 ENCOUNTER — Telehealth: Payer: Self-pay | Admitting: Internal Medicine

## 2020-01-05 NOTE — Telephone Encounter (Signed)
Lm x2 for Megan(DPR)

## 2020-01-05 NOTE — Telephone Encounter (Signed)
Recommend COVId testing Pred 40 mg daily for 10 days OTC robutussin Albuterol NEBS every 6 hrs as needed Augmentin 875 BID for 10 days Recommend OV

## 2020-01-05 NOTE — Telephone Encounter (Signed)
Lm for patient's spouse, Megan(DPR)

## 2020-01-05 NOTE — Telephone Encounter (Signed)
[phone] calling back

## 2020-01-05 NOTE — Telephone Encounter (Signed)
Spoke to patient's spouse, Megan(DPR). [name] stated that patient has continuous cough and sob with exertion. Cough is productive with yellowish to green mucus.  Dx have been present for 57mo. Patient was prescribed prednisone and abx at ED on 10/26/2019. Sx improved some with treatment. Per [name], patient is coughing so hard that he urinates on himself.  Patient is using albuterol HFA 5-6x daily, albuterol nebulizer QID, Advair BID and Spiriva one daily. On 3L cont. spo2 is maintaining between 95-98%. Patient has not had covid vaccines.  Dr. Delanna Ahmadi advise. Thanks    [name] is okay with waiting on response until 01/06/2020.

## 2020-01-06 MED ORDER — PREDNISONE 20 MG PO TABS: 40.0000 mg | ORAL_TABLET | Freq: Every day | ORAL | 0 refills | Status: DC

## 2020-01-06 MED ORDER — AMOXICILLIN-POT CLAVULANATE 875-125 MG PO TABS: 1.0000 | ORAL_TABLET | Freq: Two times a day (BID) | ORAL | 0 refills | Status: DC

## 2020-01-06 NOTE — Telephone Encounter (Signed)
[name] is returning phone call. Megan phone number is [phone].

## 2020-01-06 NOTE — Telephone Encounter (Signed)
Lm for Megan.

## 2020-01-06 NOTE — Telephone Encounter (Signed)
[name] is aware below recommendations and voiced her understanding.  Rx for prednisone and Augmentin has been sent to preferred pharmacy.  appt scheduled for 02/12/2019 at 3:30. [place] testing site contact number has been provided. Nothing further needed.

## 2020-01-17 ENCOUNTER — Emergency Department: Payer: Medicaid Other

## 2020-01-17 ENCOUNTER — Emergency Department
Admission: EM | Admit: 2020-01-17 | Discharge: 2020-01-24 | Disposition: E | Payer: Medicaid Other | Attending: Student in an Organized Health Care Education/Training Program | Admitting: Student in an Organized Health Care Education/Training Program

## 2020-01-17 ENCOUNTER — Other Ambulatory Visit: Payer: Self-pay

## 2020-01-17 DIAGNOSIS — Z20822 Contact with and (suspected) exposure to covid-19: Secondary | ICD-10-CM | POA: Diagnosis not present

## 2020-01-17 DIAGNOSIS — I2510 Atherosclerotic heart disease of native coronary artery without angina pectoris: Secondary | ICD-10-CM | POA: Insufficient documentation

## 2020-01-17 DIAGNOSIS — I469 Cardiac arrest, cause unspecified: Secondary | ICD-10-CM | POA: Diagnosis not present

## 2020-01-17 DIAGNOSIS — J9602 Acute respiratory failure with hypercapnia: Secondary | ICD-10-CM | POA: Diagnosis not present

## 2020-01-17 DIAGNOSIS — J449 Chronic obstructive pulmonary disease, unspecified: Secondary | ICD-10-CM | POA: Insufficient documentation

## 2020-01-17 DIAGNOSIS — Z7951 Long term (current) use of inhaled steroids: Secondary | ICD-10-CM | POA: Diagnosis not present

## 2020-01-17 DIAGNOSIS — Z950 Presence of cardiac pacemaker: Secondary | ICD-10-CM | POA: Insufficient documentation

## 2020-01-17 DIAGNOSIS — Z7982 Long term (current) use of aspirin: Secondary | ICD-10-CM | POA: Insufficient documentation

## 2020-01-17 DIAGNOSIS — J9601 Acute respiratory failure with hypoxia: Secondary | ICD-10-CM | POA: Diagnosis not present

## 2020-01-17 DIAGNOSIS — R0603 Acute respiratory distress: Secondary | ICD-10-CM | POA: Diagnosis present

## 2020-01-17 LAB — COMPREHENSIVE METABOLIC PANEL
ALT: 148 U/L — ABNORMAL HIGH (ref 0–44)
AST: 122 U/L — ABNORMAL HIGH (ref 15–41)
Albumin: 3.1 g/dL — ABNORMAL LOW (ref 3.5–5.0)
Alkaline Phosphatase: 80 U/L (ref 38–126)
Anion gap: 15 (ref 5–15)
BUN: 19 mg/dL (ref 8–23)
CO2: 21 mmol/L — ABNORMAL LOW (ref 22–32)
Calcium: 8.3 mg/dL — ABNORMAL LOW (ref 8.9–10.3)
Chloride: 100 mmol/L (ref 98–111)
Creatinine, Ser: 0.87 mg/dL (ref 0.61–1.24)
GFR, Estimated: >60 mL/min (ref >60)
Glucose, Bld: 172 mg/dL — ABNORMAL HIGH (ref 70–99)
Potassium: 5.2 mmol/L — ABNORMAL HIGH (ref 3.5–5.1)
Sodium: 136 mmol/L (ref 135–145)
Total Bilirubin: 1.6 mg/dL — ABNORMAL HIGH (ref 0.3–1.2)
Total Protein: 6.3 g/dL — ABNORMAL LOW (ref 6.5–8.1)

## 2020-01-17 LAB — BLOOD GAS, ARTERIAL
Acid-base deficit: 20.9 mmol/L — ABNORMAL HIGH (ref 0.0–2.0)
Bicarbonate: 12 mmol/L — ABNORMAL LOW (ref 20.0–28.0)
FIO2: 1
MECHVT: 450 mL
O2 Saturation: 86.9 %
PEEP: 5 cmH2O
Patient temperature: 37
RATE: 22 resp/min
pCO2 arterial: 67 mmHg (ref 32.0–48.0)
pH, Arterial: <6.9 — CL (ref 7.350–7.450)
pO2, Arterial: 91 mmHg (ref 83.0–108.0)

## 2020-01-17 LAB — CBC WITH DIFFERENTIAL/PLATELET
Abs Immature Granulocytes: 0.19 10*3/uL — ABNORMAL HIGH (ref 0.00–0.07)
Basophils Absolute: 0 10*3/uL (ref 0.0–0.1)
Basophils Relative: 0 %
Eosinophils Absolute: 0 10*3/uL (ref 0.0–0.5)
Eosinophils Relative: 0 %
HCT: 38.9 % — ABNORMAL LOW (ref 39.0–52.0)
Hemoglobin: 11.4 g/dL — ABNORMAL LOW (ref 13.0–17.0)
Immature Granulocytes: 1 %
Lymphocytes Relative: 5 %
Lymphs Abs: 0.9 10*3/uL (ref 0.7–4.0)
MCH: 26.1 pg (ref 26.0–34.0)
MCHC: 29.3 g/dL — ABNORMAL LOW (ref 30.0–36.0)
MCV: 89 fL (ref 80.0–100.0)
Monocytes Absolute: 1.6 10*3/uL — ABNORMAL HIGH (ref 0.1–1.0)
Monocytes Relative: 8 %
Neutro Abs: 17.2 10*3/uL — ABNORMAL HIGH (ref 1.7–7.7)
Neutrophils Relative %: 86 %
Platelets: 450 10*3/uL — ABNORMAL HIGH (ref 150–400)
RBC: 4.37 MIL/uL (ref 4.22–5.81)
RDW: 17.6 % — ABNORMAL HIGH (ref 11.5–15.5)
WBC: 20 10*3/uL — ABNORMAL HIGH (ref 4.0–10.5)
nRBC: 0 % (ref 0.0–0.2)

## 2020-01-17 LAB — CBC
HCT: 36.6 % — ABNORMAL LOW (ref 39.0–52.0)
Hemoglobin: 10.4 g/dL — ABNORMAL LOW (ref 13.0–17.0)
MCH: 26.7 pg (ref 26.0–34.0)
MCHC: 28.4 g/dL — ABNORMAL LOW (ref 30.0–36.0)
MCV: 93.8 fL (ref 80.0–100.0)
Platelets: 256 10*3/uL (ref 150–400)
RBC: 3.9 MIL/uL — ABNORMAL LOW (ref 4.22–5.81)
RDW: 17.8 % — ABNORMAL HIGH (ref 11.5–15.5)
WBC: 15.4 10*3/uL — ABNORMAL HIGH (ref 4.0–10.5)
nRBC: 0.3 % — ABNORMAL HIGH (ref 0.0–0.2)

## 2020-01-17 LAB — RESP PANEL BY RT-PCR (FLU A&B, COVID) ARPGX2
Influenza A by PCR: NEGATIVE
Influenza B by PCR: NEGATIVE
SARS Coronavirus 2 by RT PCR: NEGATIVE

## 2020-01-17 LAB — PROTIME-INR
INR: 1.7 — ABNORMAL HIGH (ref 0.8–1.2)
Prothrombin Time: 19.4 seconds — ABNORMAL HIGH (ref 11.4–15.2)

## 2020-01-17 LAB — LACTIC ACID, PLASMA: Lactic Acid, Venous: 7.1 mmol/L (ref 0.5–1.9)

## 2020-01-17 LAB — TROPONIN I (HIGH SENSITIVITY)
Troponin I (High Sensitivity): 140 ng/L (ref <18)
Troponin I (High Sensitivity): 167 ng/L (ref <18)

## 2020-01-17 LAB — APTT: aPTT: >160 seconds (ref 24–36)

## 2020-01-17 MED ORDER — SODIUM CHLORIDE 0.9 % IV SOLN
INTRAVENOUS | Status: DC | PRN
  Administered 2020-01-17: 1000 mL via INTRAVENOUS

## 2020-01-17 MED ORDER — SODIUM CHLORIDE 0.9 % IV SOLN
250.0000 mL | Freq: Once | INTRAVENOUS | Status: AC
  Administered 2020-01-17: 12:00:00 250 mL via INTRAVENOUS

## 2020-01-17 MED ORDER — MORPHINE SULFATE (PF) 4 MG/ML IV SOLN: 4.0000 mg | INTRAVENOUS | Status: DC | PRN

## 2020-01-17 MED ORDER — METHYLPREDNISOLONE SODIUM SUCC 125 MG IJ SOLR: 125.0000 mg | Freq: Once | INTRAMUSCULAR | Status: AC

## 2020-01-17 MED ORDER — MAGNESIUM SULFATE 2 GM/50ML IV SOLN
2.0000 g | Freq: Once | INTRAVENOUS | Status: AC
  Administered 2020-01-17: 09:00:00 2 g via INTRAVENOUS
  Filled 2020-01-17: qty 50

## 2020-01-17 MED ORDER — IPRATROPIUM-ALBUTEROL 0.5-2.5 (3) MG/3ML IN SOLN: 3.0000 mL | Freq: Once | RESPIRATORY_TRACT | Status: AC

## 2020-01-17 MED ORDER — LACTATED RINGERS IV BOLUS
1000.0000 mL | Freq: Once | INTRAVENOUS | Status: AC
  Administered 2020-01-17: 11:00:00 1000 mL via INTRAVENOUS

## 2020-01-17 MED ORDER — ROCURONIUM BROMIDE 50 MG/5ML IV SOLN
INTRAVENOUS | Status: DC | PRN
  Administered 2020-01-17: 100 mg via INTRAVENOUS

## 2020-01-17 MED ORDER — ALTEPLASE 100 MG IV SOLR
INTRAVENOUS | Status: AC
  Administered 2020-01-17: 12:00:00 100 mg via INTRAVENOUS
  Filled 2020-01-17: qty 100

## 2020-01-17 MED ORDER — SODIUM BICARBONATE 8.4 % IV SOLN
INTRAVENOUS | Status: DC | PRN
  Administered 2020-01-17: 50 meq via INTRAVENOUS

## 2020-01-17 MED ORDER — SODIUM CHLORIDE 0.9 % IV SOLN
500.0000 mg | Freq: Once | INTRAVENOUS | Status: AC
  Administered 2020-01-17: 10:00:00 500 mg via INTRAVENOUS
  Filled 2020-01-17: qty 500

## 2020-01-17 MED ORDER — HEPARIN BOLUS VIA INFUSION
3300.0000 [IU] | Freq: Once | INTRAVENOUS | Status: AC
  Administered 2020-01-17: 11:00:00 3300 [IU] via INTRAVENOUS
  Filled 2020-01-17: qty 3300

## 2020-01-17 MED ORDER — VANCOMYCIN HCL IN DEXTROSE 1-5 GM/200ML-% IV SOLN
1000.0000 mg | Freq: Once | INTRAVENOUS | Status: DC
  Filled 2020-01-17: qty 200

## 2020-01-17 MED ORDER — IPRATROPIUM-ALBUTEROL 0.5-2.5 (3) MG/3ML IN SOLN
3.0000 mL | Freq: Once | RESPIRATORY_TRACT | Status: AC
  Administered 2020-01-17: 09:00:00 3 mL via RESPIRATORY_TRACT

## 2020-01-17 MED ORDER — LACTATED RINGERS IV SOLN: INTRAVENOUS | Status: DC

## 2020-01-17 MED ORDER — LACTATED RINGERS IV BOLUS
1000.0000 mL | Freq: Once | INTRAVENOUS | Status: AC
  Administered 2020-01-17: 10:00:00 1000 mL via INTRAVENOUS

## 2020-01-17 MED ORDER — IOHEXOL 350 MG/ML SOLN: 75.0000 mL | Freq: Once | INTRAVENOUS | Status: DC | PRN

## 2020-01-17 MED ORDER — METHYLPREDNISOLONE SODIUM SUCC 125 MG IJ SOLR
INTRAMUSCULAR | Status: AC
  Administered 2020-01-17: 09:00:00 125 mg via INTRAVENOUS
  Filled 2020-01-17: qty 2

## 2020-01-17 MED ORDER — NOREPINEPHRINE 4 MG/250ML-% IV SOLN
0.0000 ug/min | INTRAVENOUS | Status: DC
  Administered 2020-01-17: 11:00:00 4 ug/min via INTRAVENOUS
  Filled 2020-01-17 (×2): qty 250

## 2020-01-17 MED ORDER — FENTANYL CITRATE (PF) 100 MCG/2ML IJ SOLN
INTRAMUSCULAR | Status: DC | PRN
  Administered 2020-01-17: 100 ug via INTRAVENOUS

## 2020-01-17 MED ORDER — ALTEPLASE (PULMONARY EMBOLISM) INFUSION
5.0000 mg | Freq: Once | INTRAVENOUS | Status: AC
  Administered 2020-01-17: 13:00:00 5 mg via INTRAVENOUS
  Filled 2020-01-17: qty 5

## 2020-01-17 MED ORDER — DEXMEDETOMIDINE HCL IN NACL 400 MCG/100ML IV SOLN: 0.4000 ug/kg/h | INTRAVENOUS | Status: DC

## 2020-01-17 MED ORDER — IPRATROPIUM-ALBUTEROL 0.5-2.5 (3) MG/3ML IN SOLN
RESPIRATORY_TRACT | Status: AC
  Administered 2020-01-17: 09:00:00 3 mL via RESPIRATORY_TRACT
  Filled 2020-01-17: qty 9

## 2020-01-17 MED ORDER — LORAZEPAM 2 MG/ML IJ SOLN
0.5000 mg | Freq: Once | INTRAMUSCULAR | Status: DC
  Filled 2020-01-17: qty 1

## 2020-01-17 MED ORDER — VASOPRESSIN 20 UNITS/100 ML INFUSION FOR SHOCK
0.0000 [IU]/min | INTRAVENOUS | Status: DC
  Administered 2020-01-17: 13:00:00 0.03 [IU]/min via INTRAVENOUS
  Filled 2020-01-17: qty 100

## 2020-01-17 MED ORDER — ETOMIDATE 2 MG/ML IV SOLN
INTRAVENOUS | Status: DC | PRN
  Administered 2020-01-17: 20 mg via INTRAVENOUS

## 2020-01-17 MED ORDER — EPINEPHRINE 1 MG/10ML IJ SOSY
PREFILLED_SYRINGE | INTRAMUSCULAR | Status: DC | PRN
Start: 2020-01-17 — End: 2020-01-17
  Administered 2020-01-17 (×4): 1 mg via INTRAVENOUS

## 2020-01-17 MED ORDER — HEPARIN (PORCINE) 25000 UT/250ML-% IV SOLN
650.0000 [IU]/h | INTRAVENOUS | Status: DC
  Administered 2020-01-17: 11:00:00 650 [IU]/h via INTRAVENOUS
  Filled 2020-01-17: qty 250

## 2020-01-17 MED ORDER — SODIUM CHLORIDE 0.9 % IV SOLN
2.0000 g | Freq: Once | INTRAVENOUS | Status: AC
  Administered 2020-01-17: 10:00:00 2 g via INTRAVENOUS
  Filled 2020-01-17: qty 2

## 2020-01-17 MED ORDER — ALTEPLASE (PULMONARY EMBOLISM) INFUSION
50.0000 mg | Freq: Once | INTRAVENOUS | Status: AC
  Filled 2020-01-17: qty 50

## 2020-01-17 MED ORDER — SODIUM CHLORIDE 0.9 % IV BOLUS
500.0000 mL | Freq: Once | INTRAVENOUS | Status: AC
  Administered 2020-01-17: 09:00:00 500 mL via INTRAVENOUS

## 2020-01-19 MED FILL — Medication: Qty: 1 | Status: AC

## 2020-01-22 LAB — CULTURE, BLOOD (ROUTINE X 2)
Culture: NO GROWTH
Culture: NO GROWTH
Special Requests: ADEQUATE

## 2020-01-24 NOTE — Progress Notes (Addendum)
ANTICOAGULATION CONSULT NOTE - Initial Consult  Pharmacy Consult for Heparin drip Indication: chest pain/ACS  No Known Allergies  Patient Measurements: Height: 6' (182.9 cm) Weight: 55.1 kg (121 lb 8 oz) IBW/kg (Calculated) : 77.6 Heparin Dosing Weight: 55 kg  Vital Signs: Temp: 94.4 F (34.7 C) (12/25 0900) Temp Source: Axillary (12/25 0900) BP: 73/53 (12/25 1045) Pulse Rate: 128 (12/25 1045)  Labs: Recent Labs    2020/01/31 0903  HGB 11.4*  HCT 38.9*  PLT 450*  CREATININE 0.87  TROPONINIHS 140*    Estimated Creatinine Clearance: 69.5 mL/min (by C-G formula based on SCr of 0.87 mg/dL).   Medical History: Past Medical History:  Diagnosis Date  . Anxiety   . Arthritis   . Atrial septal defect   . CHF (congestive heart failure) ([place])   . COPD (chronic obstructive pulmonary disease) (HCC)    on 3L O2 - continuous  . Coronary artery disease   . CVA (cerebral vascular accident) ([place]) 02/2018   "mild" - no deficits  . Depression   . Dyspnea    with exertion  . Dysrhythmia    nonsustained VT, Afib  . Dysrhythmias   . Environmental and seasonal allergies   . GERD (gastroesophageal reflux disease)   . Hyperlipidemia   . 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 Malnourished ([place])   . Multiple myeloma (HCC)    multiple myeloma  . Myocardial infarction ([place])   . Neuropathy    feet, legs, hands  . Presence of permanent cardiac pacemaker    defib pacemaker  . Substance abuse ([place])     Medications:  Scheduled:  . LORazepam  0.5 mg Intravenous Once   Infusions:  . dexmedetomidine (PRECEDEX) IV infusion    . heparin 650 Units/hr (2020/01/31 1111)  . lactated ringers    . norepinephrine (LEVOPHED) Adult infusion 10 mcg/min (01-31-20 1114)  . vancomycin      Assessment: 60 yo M to start Heparin drip for ACs/STEMI On ASA PTA per Med REc Hgb 11.4  plt 450   APTT >160 (drawn after Heparin drip hung  at 1111)  INR 1.7  Goal of Therapy:  Heparin level 0.3-0.7 units/ml Monitor platelets by anticoagulation protocol: Yes   Plan:  Give 3300 units bolus x 1 Start heparin infusion at 650 units/hr Check anti-Xa level in 6 hours and daily while on heparin Continue to monitor H&H and platelets  [name] A 2020/01/31,11:19 AM

## 2020-01-24 NOTE — ED Notes (Signed)
Called pharmacy to get vasopressin sent to ER

## 2020-01-24 NOTE — ED Notes (Signed)
Arrived to patient's room ~1100 to administer heparin, ativan, and norepi. Patient noted to be disconnected from bipap (mask still on face but tubing disconnected from mask.) HR significantly lower than admission. No pulse, CPR initiated. MD, RT, several RNs to bedside. See Code flowsheet for further.   MD had just been at patient's bedside a few minutes prior to discuss patient's desire to DC bipap.

## 2020-01-24 NOTE — Progress Notes (Signed)
PHARMACY -  BRIEF ANTIBIOTIC NOTE   Pharmacy has received consult(s) for Vancomycin and cefepime from an ED provider.  The patient's profile has been reviewed for ht/wt/allergies/indication/available labs.    One time order(s) placed by MD for Vancomycin 1 gram and Cefepime 2 gm  Further antibiotics/pharmacy consults should be ordered by admitting physician if indicated.                       Thank you, Saharah Sherrow A 01/27/20  9:42 AM

## 2020-01-24 NOTE — ED Provider Notes (Signed)
[facility] Emergency Department Provider Note    None    (approximate)  I have reviewed the triage vital signs and the nursing notes.   HISTORY  Chief Complaint Respiratory Distress  Level V Caveat:  resp distress  HPI [name] is a 60 y.o. male with extensive history as listed below presents to the ER for generalized malaise worsening shortness of breath and arrives in acute respiratory failure.  Markedly tachypneic.  Unable to speak in more than 1 word responses.  Reportedly has been trying to treat this like a COPD exacerbation at home.  Denies any pain.  No measured fevers.  States that he is a full code.    Past Medical History:  Diagnosis Date  . Anxiety   . Arthritis   . Atrial septal defect   . CHF (congestive heart failure) ([place])   . COPD (chronic obstructive pulmonary disease) (HCC)    on 3L O2 - continuous  . Coronary artery disease   . CVA (cerebral vascular accident) ([place]) 02/2018   "mild" - no deficits  . Depression   . Dyspnea    with exertion  . Dysrhythmia    nonsustained VT, Afib  . Dysrhythmias   . Environmental and seasonal allergies   . GERD (gastroesophageal reflux disease)   . Hyperlipidemia   . 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 Malnourished ([place])   . Multiple myeloma (HCC)    multiple myeloma  . Myocardial infarction ([place])   . Neuropathy    feet, legs, hands  . Presence of permanent cardiac pacemaker    defib pacemaker  . Substance abuse ([place])    Family History  Problem Relation Age of Onset  . COPD Mother   . CAD Mother   . Heart attack Father   . Prostate cancer Maternal Grandfather   . Kidney cancer Neg Hx   . Bladder Cancer Neg Hx    Past Surgical History:  Procedure Laterality Date  . CARDIAC DEFIBRILLATOR PLACEMENT  08/22/2011   [employer] single chamber placed by Dr Marchia Meiers, [place]  . CATARACT EXTRACTION  W/PHACO Right 12/13/2018   Procedure: CATARACT EXTRACTION PHACO AND INTRAOCULAR LENS PLACEMENT ([place]) RIGHT;  Surgeon: Birder Robson, MD;  Location: [facility] ORS;  Service: Ophthalmology;  Laterality: Right;  [place] 01:21.4 CDE 15.56 FLUID PACK LOT # [id number]  . CATARACT EXTRACTION W/PHACO Left 01/23/2019   Procedure: CATARACT EXTRACTION PHACO AND INTRAOCULAR LENS PLACEMENT ([place]) LEFT;  Surgeon: Birder Robson, MD;  Location: [facility] ORS;  Service: Ophthalmology;  Laterality: Left;  [place] 01:13.5 CDE 13.33 Fluid Pack lot # [id number]  . TOOTH EXTRACTION     Patient Active Problem List   Diagnosis Date Noted  . Chronic respiratory failure with hypoxia ([place]) 03/25/2019  . Abnormal CT of the chest 03/25/2019  . Palliative care encounter   . Altered mental status, unspecified 03/08/2018  . CVA (cerebral vascular accident) ([place]) 03/08/2018  . Gastroenteritis 02/15/2018  . Fatigue due to treatment 09/07/2016  . Palliative care by specialist   . Advance care planning   . Other insomnia   . Protein-calorie malnutrition, severe 05/25/2016  . Aspiration pneumonia ([place]) 05/23/2016  . Cough in adult 05/05/2016  . Multiple myeloma in relapse ([place]) 09/09/2015  . Congestive heart failure ([place]) 01/06/2015  . Cardiomyopathy ([place]) 01/06/2015  . Nonsustained ventricular tachycardia ([place]) 01/06/2015  . Malnutrition of moderate degree 12/24/2014  . Pressure ulcer 12/24/2014  . Cellulitis  of second finger of left hand   . Sepsis ([place]) 12/23/2014  . 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 CAD (coronary artery disease) 08/22/2011  . Cardiac conduction disorder 08/22/2011  . Myocardial infarction ([place]) 08/22/2011  . Cardiac arrhythmia 08/22/2011  . Anxiety 08/21/2011  . GERD (gastroesophageal reflux disease) 08/21/2011  . Chronic obstructive pulmonary disease ([place]) 07/26/2011  . Nicotine addiction 07/26/2011      Prior to Admission medications   Medication Sig Start Date End  Date Taking? Authorizing Provider  aspirin EC 81 MG EC tablet Take 1 tablet (81 mg total) by mouth daily. 03/13/18  Yes Sudini, Alveta Heimlich, MD  dexamethasone (DECADRON) 4 MG tablet TAKE THREE TABLETS BY MOUTH ONCE A WEEK IN MORNING WITH BREAKFAST 04/11/19  Yes Borders, Kirt Boys, NP  Fluticasone-Salmeterol (ADVAIR DISKUS) 250-50 MCG/DOSE AEPB Inhale 1 puff into the lungs 2 (two) times daily. 05/22/18 08/08/23 Yes Kasa, Maretta Bees, MD  gabapentin (NEURONTIN) 400 MG capsule TAKE TWO CAPSULES BY MOUTH EVERY MORNING, ONE CAPSULE IN THE AFTERNOON & TWO CAPSULES EVERY EVENING 11/21/19  Yes Cammie Sickle, MD  ipratropium-albuterol (DUONEB) 0.5-2.5 (3) MG/3ML SOLN Take 3 mLs by nebulization every 4 (four) hours as needed. 05/22/18  Yes Flora Lipps, MD  naloxone [facility]) nasal spray 4 mg/0.1 mL For Opioid Overdose: Call 911 and administer a single spray of Narcan in one nostril. Repeat every 84mns as needed if no or minimal response 02/20/18  Yes Brahmanday, GElisha Headland MD  NINLARO 3 MG capsule TAKE 1 CAPSULE (3 MG TOTAL) BY MOUTH EVERY WEDNESDAY. TAKE FOR 3 WEEKS ON, THEN 1 WEEK OFF. TAKE ON AN EMPTY STOMACH 1HR BEFORE OR 2HRS AFTER MEALS 11/03/19  Yes BCammie Sickle MD  ondansetron (ZOFRAN) 8 MG tablet TAKE ONE TABLET BY MOUTH EVERY 8 HOURS AS NEEDED 11/04/19  Yes BCammie Sickle MD  OXYGEN Inhale 3 L into the lungs continuous.   Yes [provider]  sertraline (ZOLOFT) 50 MG tablet TAKE ONE TABLET BY MOUTH ONCE DAILY 12/26/19  Yes BCammie Sickle MD  tiotropium (SPIRIVA HANDIHALER) 18 MCG inhalation capsule Place 1 capsule (18 mcg total) into inhaler and inhale daily. 05/22/18 08/08/23 Yes KFlora Lipps MD    Allergies Patient has no known allergies.    Social History Social History   Tobacco Use  . Smoking status: Current Every Day Smoker    Packs/day: 0.50    Years: 40.00    Pack years: 20.00    Types: Cigarettes  . Smokeless tobacco: Never Used  Vaping Use  . Vaping  Use: Never used  Substance Use Topics  . Alcohol use: No    Alcohol/week: 0.0 standard drinks  . Drug use: No    Review of Systems Patient denies headaches, rhinorrhea, blurry vision, numbness, shortness of breath, chest pain, edema, cough, abdominal pain, nausea, vomiting, diarrhea, dysuria, fevers, rashes or hallucinations unless otherwise stated above in HPI. ____________________________________________   PHYSICAL EXAM:  VITAL SIGNS: Vitals:   102-Jan-20221215 101/02/221300  BP: 97/75 (!) 87/58  Pulse:    Resp: (!) 22 (!) 22  Temp:    SpO2:      Constitutional: Alert, critically ill appearing, moderate resp distress Eyes: Conjunctivae are normal.  Head: Atraumatic. Nose: No congestion/rhinnorhea. Mouth/Throat: Mucous membranes are moist.   Neck: No stridor. Painless ROM.  Cardiovascular: tachycardic rate, regular rhythm. Grossly normal heart sounds.  delayed cap refill Respiratory: tachypnea with prolonged expir phase, coarse faint exp wheeze,  Diminished bs throughout Gastrointestinal:  Soft and nontender. No distention. No abdominal bruits. No CVA tenderness. Genitourinary:  Musculoskeletal: No lower extremity tenderness nor edema.  No joint effusions. Neurologic:  No gross focal neurologic deficits are appreciated. No facial droop Skin:  Skin is warm, dry, delayed cap refill. Psychiatric: anxious ____________________________________________   LABS (all labs ordered are listed, but only abnormal results are displayed)  Results for orders placed or performed during the hospital encounter of 02-02-2020 (from the past 24 hour(s))  CBC with Differential     Status: Abnormal   Collection Time: 02-02-2020  9:03 AM  Result Value Ref Range   WBC 20.0 (H) 4.0 - 10.5 K/uL   RBC 4.37 4.22 - 5.81 MIL/uL   Hemoglobin 11.4 (L) 13.0 - 17.0 g/dL   HCT 38.9 (L) 39.0 - 52.0 %   MCV 89.0 80.0 - 100.0 fL   MCH 26.1 26.0 - 34.0 pg   MCHC 29.3 (L) 30.0 - 36.0 g/dL   RDW 17.6 (H) 11.5 -  15.5 %   Platelets 450 (H) 150 - 400 K/uL   nRBC 0.0 0.0 - 0.2 %   Neutrophils Relative % 86 %   Neutro Abs 17.2 (H) 1.7 - 7.7 K/uL   Lymphocytes Relative 5 %   Lymphs Abs 0.9 0.7 - 4.0 K/uL   Monocytes Relative 8 %   Monocytes Absolute 1.6 (H) 0.1 - 1.0 K/uL   Eosinophils Relative 0 %   Eosinophils Absolute 0.0 0.0 - 0.5 K/uL   Basophils Relative 0 %   Basophils Absolute 0.0 0.0 - 0.1 K/uL   Immature Granulocytes 1 %   Abs Immature Granulocytes 0.19 (H) 0.00 - 0.07 K/uL  Comprehensive metabolic panel     Status: Abnormal   Collection Time: 02-02-2020  9:03 AM  Result Value Ref Range   Sodium 136 135 - 145 mmol/L   Potassium 5.2 (H) 3.5 - 5.1 mmol/L   Chloride 100 98 - 111 mmol/L   CO2 21 (L) 22 - 32 mmol/L   Glucose, Bld 172 (H) 70 - 99 mg/dL   BUN 19 8 - 23 mg/dL   Creatinine, Ser 0.87 0.61 - 1.24 mg/dL   Calcium 8.3 (L) 8.9 - 10.3 mg/dL   Total Protein 6.3 (L) 6.5 - 8.1 g/dL   Albumin 3.1 (L) 3.5 - 5.0 g/dL   AST 122 (H) 15 - 41 U/L   ALT 148 (H) 0 - 44 U/L   Alkaline Phosphatase 80 38 - 126 U/L   Total Bilirubin 1.6 (H) 0.3 - 1.2 mg/dL   GFR, Estimated >60 >60 mL/min   Anion gap 15 5 - 15  Lactic acid, plasma     Status: Abnormal   Collection Time: Feb 02, 2020  9:03 AM  Result Value Ref Range   Lactic Acid, Venous 7.1 (HH) 0.5 - 1.9 mmol/L  Blood gas, venous     Status: Abnormal (Preliminary result)   Collection Time: February 02, 2020  9:03 AM  Result Value Ref Range   FIO2 1.00    Delivery systems BILEVEL POSITIVE AIRWAY PRESSURE    pH, Ven 7.07 (LL) 7.250 - 7.430   pCO2, Ven 90 (HH) 44.0 - 60.0 mmHg   pO2, Ven PENDING 32.0 - 45.0 mmHg   Bicarbonate 26.1 20.0 - 28.0 mmol/L   Acid-base deficit 5.9 (H) 0.0 - 2.0 mmol/L   O2 Saturation 10.3 %   Patient temperature 37.0    Collection site VENOUS    Sample type VENOUS   Resp Panel by RT-PCR (Flu A&B, Covid)  Nasopharyngeal Swab     Status: None   Collection Time: 2020/01/29  9:03 AM   Specimen: Nasopharyngeal Swab;  Nasopharyngeal(NP) swabs in vial transport medium  Result Value Ref Range   SARS Coronavirus 2 by RT PCR NEGATIVE NEGATIVE   Influenza A by PCR NEGATIVE NEGATIVE   Influenza B by PCR NEGATIVE NEGATIVE  Troponin I (High Sensitivity)     Status: Abnormal   Collection Time: 2020-01-29  9:03 AM  Result Value Ref Range   Troponin I (High Sensitivity) 140 (HH) <18 ng/L  Troponin I (High Sensitivity)     Status: Abnormal   Collection Time: 01-29-20 12:15 PM  Result Value Ref Range   Troponin I (High Sensitivity) 167 (HH) <18 ng/L  APTT     Status: Abnormal   Collection Time: 01/29/20 12:15 PM  Result Value Ref Range   aPTT >160 (HH) 24 - 36 seconds  Protime-INR     Status: Abnormal   Collection Time: 29-Jan-2020 12:15 PM  Result Value Ref Range   Prothrombin Time 19.4 (H) 11.4 - 15.2 seconds   INR 1.7 (H) 0.8 - 1.2  CBC     Status: Abnormal   Collection Time: 01/29/2020 12:15 PM  Result Value Ref Range   WBC 15.4 (H) 4.0 - 10.5 K/uL   RBC 3.90 (L) 4.22 - 5.81 MIL/uL   Hemoglobin 10.4 (L) 13.0 - 17.0 g/dL   HCT 36.6 (L) 39.0 - 52.0 %   MCV 93.8 80.0 - 100.0 fL   MCH 26.7 26.0 - 34.0 pg   MCHC 28.4 (L) 30.0 - 36.0 g/dL   RDW 17.8 (H) 11.5 - 15.5 %   Platelets 256 150 - 400 K/uL   nRBC 0.3 (H) 0.0 - 0.2 %  Blood gas, arterial     Status: Abnormal   Collection Time: 01/29/2020 12:24 PM  Result Value Ref Range   FIO2 1.00    Delivery systems VENTILATOR    Mode ASSIST CONTROL    VT 450 mL   LHR 22 resp/min   Peep/cpap 5.0 cm H20   pH, Arterial <6.900 (LL) 7.350 - 7.450   pCO2 arterial 67 (HH) 32.0 - 48.0 mmHg   pO2, Arterial 91 83.0 - 108.0 mmHg   Bicarbonate 12.0 (L) 20.0 - 28.0 mmol/L   Acid-base deficit 20.9 (H) 0.0 - 2.0 mmol/L   O2 Saturation 86.9 %   Patient temperature 37.0    Collection site RIGHT RADIAL    Sample type ARTERIAL DRAW    Allens test (pass/fail) PASS PASS   ____________________________________________  EKG My review and personal interpretation at Time: 9:13    Indication: sob  Rate: 140  Rhythm: sinus Axis: normal Other: rbbb, nonspecific st and t wave abn ____________________________________________  RADIOLOGY  I personally reviewed all radiographic images ordered to evaluate for the above acute complaints and reviewed radiology reports and findings.  These findings were personally discussed with the patient.  Please see medical record for radiology report.  ____________________________________________   PROCEDURES  Procedure(s) performed:  .Critical Care Performed by: Merlyn Lot, MD Authorized by: Merlyn Lot, MD   Critical care provider statement:    Critical care time (minutes):  55   Critical care time was exclusive of:  Separately billable procedures and treating other patients   Critical care was necessary to treat or prevent imminent or life-threatening deterioration of the following conditions:  Respiratory failure   Critical care was time spent personally by me on the following activities:  Development of  treatment plan with patient or surrogate, discussions with consultants, evaluation of patient's response to treatment, examination of patient, obtaining history from patient or surrogate, ordering and performing treatments and interventions, ordering and review of laboratory studies, ordering and review of radiographic studies, pulse oximetry, re-evaluation of patient's condition and review of old charts Procedure Name: Intubation Date/Time: 02/05/20 2:24 PM Performed by: Merlyn Lot, MD Pre-anesthesia Checklist: Patient identified, Patient being monitored, Emergency Drugs available, Timeout performed and Suction available Oxygen Delivery Method: Non-rebreather mask Preoxygenation: Pre-oxygenation with 100% oxygen Induction Type: Rapid sequence Ventilation: Mask ventilation without difficulty Laryngoscope Size: Mac and 3 Grade View: Grade II Placement Confirmation: ETT inserted through vocal cords under  direct vision,  CO2 detector and Breath sounds checked- equal and bilateral         Critical Care performed: yes ____________________________________________   INITIAL IMPRESSION / ASSESSMENT AND PLAN / ED COURSE  Pertinent labs & imaging results that were available during my care of the patient were reviewed by me and considered in my medical decision making (see chart for details).   DDX: Asthma, copd, CHF, pna, ptx, malignancy, Pe, anemia  65 A Giannone is a 60 y.o. who presents to the ED with evidence of acute respiratory failure which initially on presentation sounds like severe COPD exacerbation.  He is hypoxic moving minimal air throughout given nebs.  Will give Solu-Medrol.  Patient placed on BiPAP.  Blood will be ordered for the but differential.  Patient with complex past medical history extensive cardiac, multimyeloma, COPD history not on anticoagulation.  Will give some IV fluid.  Order chest x-ray.  He appears more comfortable on BiPAP.  He is significantly tachycardic however.  Blood pressure is low will give fluids evaluate for sepsis.  No sign of pneumothorax.  Possible PE.  Clinical Course as of 02-05-20 1424  Sat Feb 05, 2020  0915 Patient's VBG does show a significant respiratory acidosis.  Patient feels improved on BiPAP.  Still tachycardic receiving IV fluids. [PR]  0940 Lactic acidemia noted.  Patient reassessed.  He is feeling significantly improved.  I Minna cover for pneumonia given his comorbidities and lactic acidosis with hypotension and tachycardia will cover for sepsis.  I think that most of this lactic acidemia is driven by hypoxemia.  Will continue with IV fluid resuscitation but given his history of CHF and I think this is more likely hypoxia than true intravascular depletion will be judicious with IV fluids. [PR]  1007 Troponin elevated to 140.  Also I suspect secondary to hypoxemia however given his risk factors presentation will heparinize as PE is in the  differential. [PR]  1027 Patient is refusing CT scan with contrast, because of the way "it makes him feel."  Understands that the best way to evaluate for PE is with CTA.   Patient is going to be heparinized regardless.  His hemodynamics are improving.  At this time does appear appropriate for admission to hospital for further medical work-up. [PR]  1045 Covid is negative.  Breathing has significantly improved.  Pulling good volumes on BiPAP.  Patient called out stating he was feeling very anxious and is requesting something for his anxiety.  Will order low-dose of Ativan.  I have discussed with hospitalist for admission to stepdown bed. [PR]  3818 Late note: shortly after seeing patient despite clinical improvement initially patient had sudden deterioration or patient became hypoxic hypotensive, bradycardic unresponsive. This was beofre receiving ativan.  IVF had been started CPR was initiated.  ACLS protocols  were followed.  Patient was intubated.  Was PEA arrest. Bedside US without PE, no edema.  No free fluid in abdomen.  IVC dilated. We have been performing high-quality CPR for 30 minutes now we are briefly able to regain  pulses but they're brief in nature and only maintained with maximal vasopressor support.  Although patient refused CT contrast despite understanding the risks I am suspicious for PE as bedside ultrasound does not show evidence of pneumothorax no significant edema on chest x-ray he has right heart strain hypokinesis myocardium.  Given his history of cancer on chemo as he is unstable I am going to order TPA. [PR]  1230 Patient's hemodynamics transiently seem to have improved after TPA. Suspect PE however patient with persistent significant hypoxia with spo2 of 40-50. If remained stable for the next few moments we will try to obtain CT PE.  I have spoken with family and POA on the phone to update of critical condition and poor prognosis.   [PR]    7673 Family gathering at bedside. Blood  pressure waxing waning between 41P and 379 systolic. Still with significant hypoxia despite maximal ventilator support. Family at bedside are discussing whether they want to withdraw care. [PR]  0240 Family decided that they want to withdrawal and transition to comfort measures only. [PR]  9735 After my few moments from discontinuing vasopressor support no the patient was pulseless. Time of death called at 145. Family at bedside provided support. [PR]    Clinical Course User Index [PR] Merlyn Lot, MD    The patient was evaluated in Emergency Department today for the symptoms described in the history of present illness. He/she was evaluated in the context of the global COVID-19 pandemic, which necessitated consideration that the patient might be at risk for infection with the SARS-CoV-2 virus that causes COVID-19. Institutional protocols and algorithms that pertain to the evaluation of patients at risk for COVID-19 are in a state of rapid change based on information released by regulatory bodies including the CDC and federal and state organizations. These policies and algorithms were followed during the patient's care in the ED.  As part of my medical decision making, I reviewed the following data within the [place] notes reviewed and incorporated, Labs reviewed, notes from prior ED visits and [place] Controlled Substance Database   ____________________________________________   FINAL CLINICAL IMPRESSION(S) / ED DIAGNOSES  Final diagnoses:  Acute respiratory failure with hypoxia and hypercapnia (HCC)  Cardiac arrest ([place])      NEW MEDICATIONS STARTED DURING THIS VISIT:  New Prescriptions   No medications on file     Note:  This document was prepared using Dragon voice recognition software and may include unintentional dictation errors.    Merlyn Lot, MD 27-Jan-2020 [phone]

## 2020-01-24 NOTE — ED Notes (Signed)
Several family members at bedside for EoL visitation. Plan for withdrawal of care soon.

## 2020-01-24 NOTE — ED Triage Notes (Signed)
BIBA from home for respiratory distress. 60-70% on NRB, hypotensive, tachycardic. H/o COPD and MM. Patient awake alert in significant distress on arrival. Rec'd duoneb x2 PTA. Per EMS, patient has DNR but does not want DNR honored at this time.

## 2020-01-24 DEATH — deceased

## 2020-02-12 ENCOUNTER — Ambulatory Visit: Payer: Medicaid Other | Admitting: Internal Medicine

## 2020-03-10 LAB — BLOOD GAS, VENOUS
Acid-base deficit: 5.9 mmol/L — ABNORMAL HIGH (ref 0.0–2.0)
Bicarbonate: 26.1 mmol/L (ref 20.0–28.0)
Delivery systems: POSITIVE
FIO2: 1
O2 Saturation: 10.3 %
Patient temperature: 37
pCO2, Ven: 90 mmHg (ref 44.0–60.0)
pH, Ven: 7.07 — CL (ref 7.250–7.430)

## 2020-04-15 ENCOUNTER — Other Ambulatory Visit (HOSPITAL_BASED_OUTPATIENT_CLINIC_OR_DEPARTMENT_OTHER): Payer: Self-pay

## 2021-01-15 IMAGING — CR METASTATIC BONE SURVEY
8 of 10 series · 8 of 10 positions shown · non-contrast
Comparison: PET-CT 11/05/2017.  Bone survey 09/17/2017.

CLINICAL DATA: Multiple myeloma.

EXAM:
METASTATIC BONE SURVEY

[chest pa]
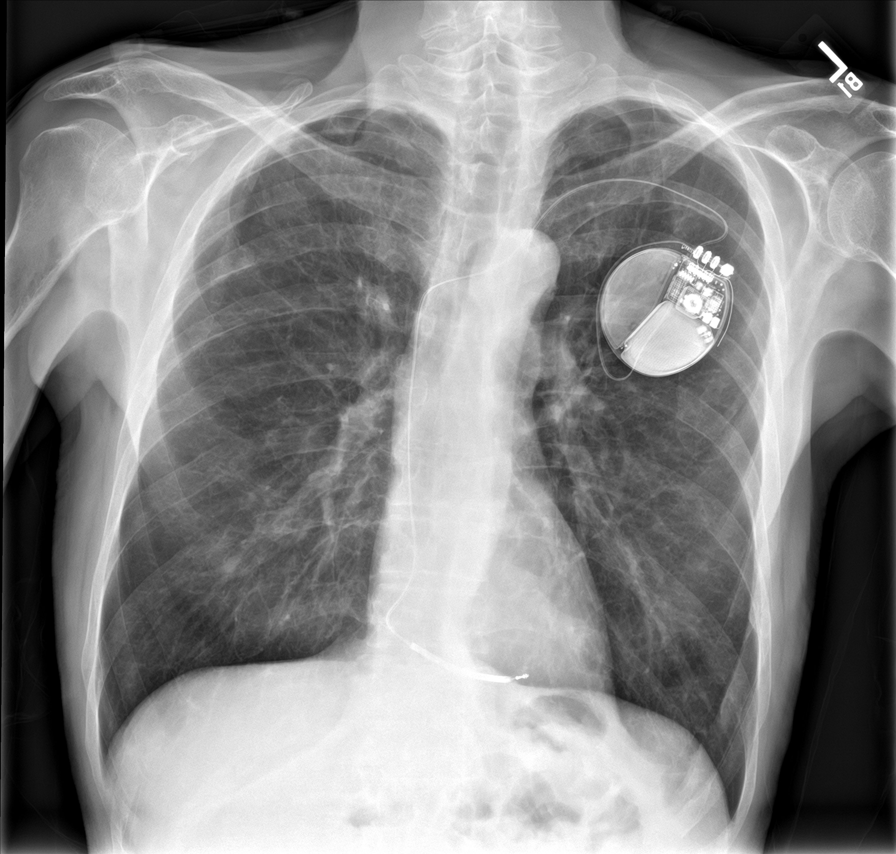

[c-spine lat]
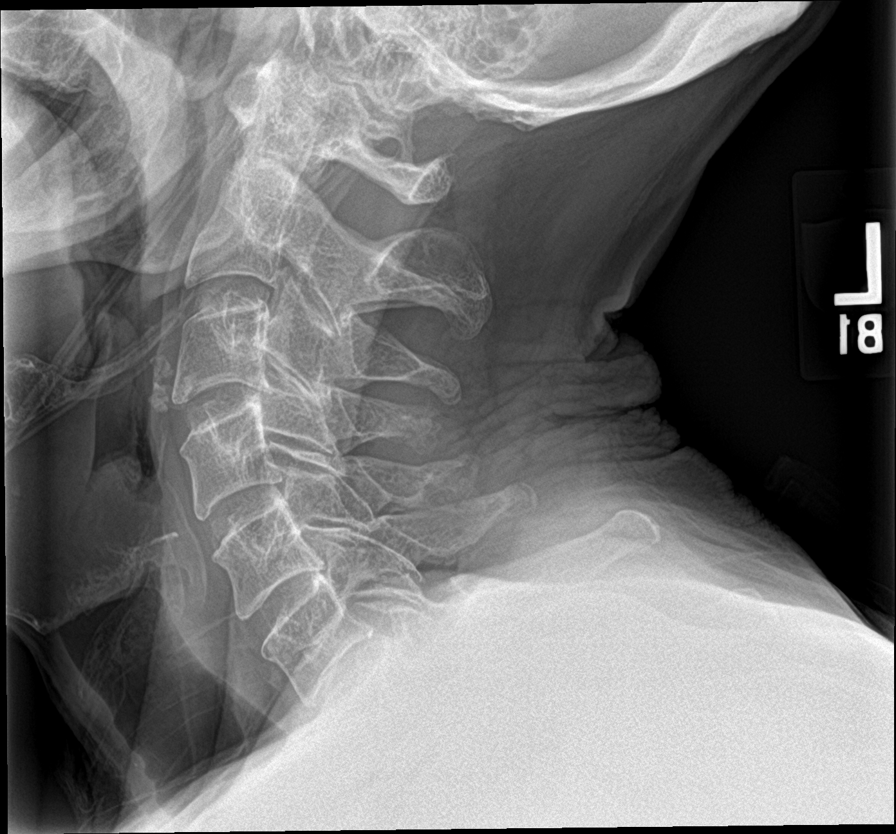

[c-spine swimmers]
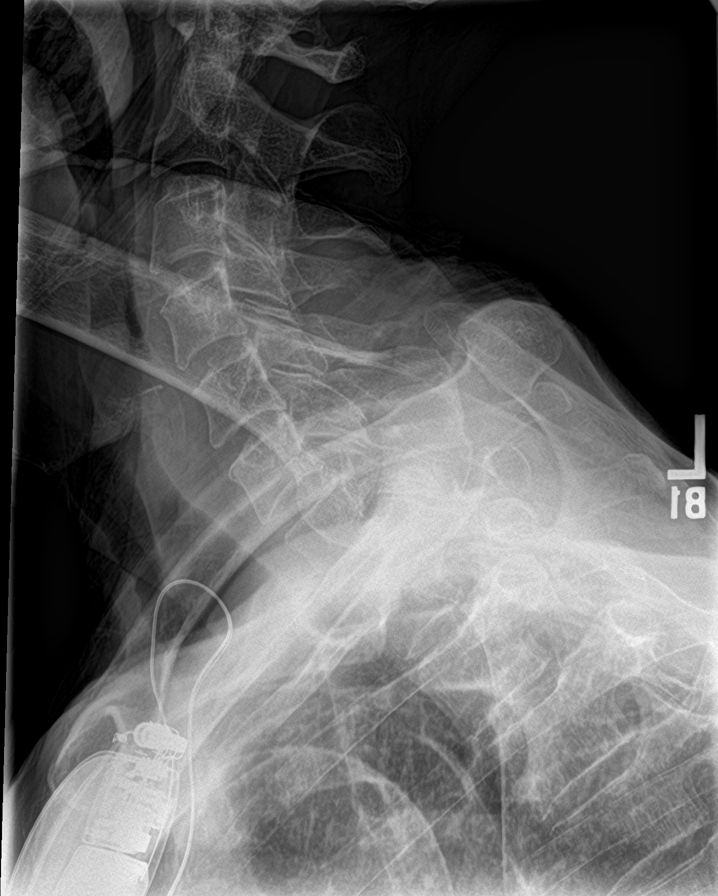

[skull lat]
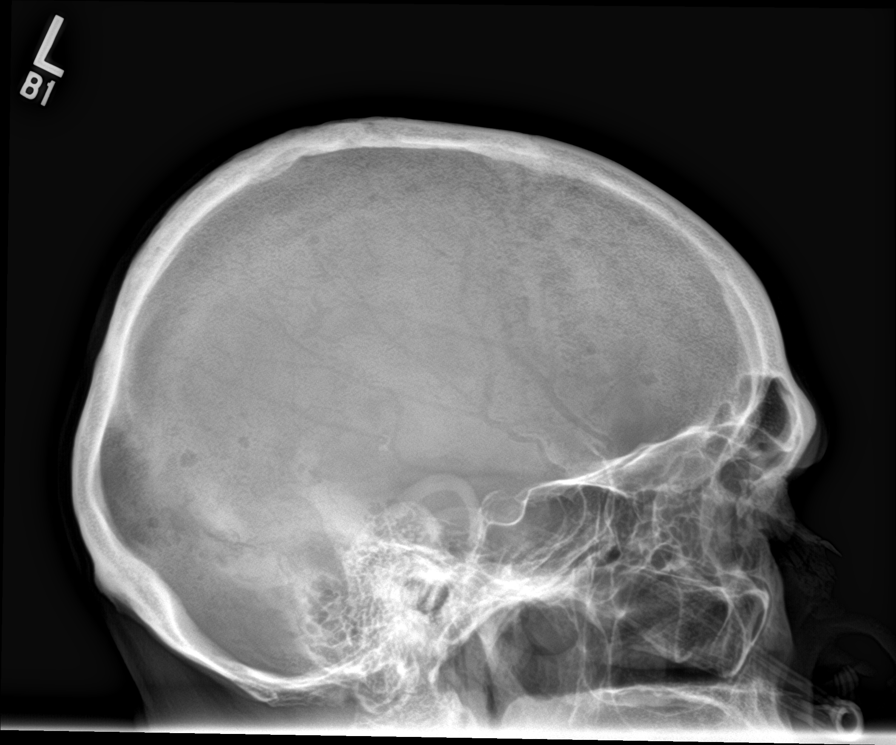

[c-spine ap]
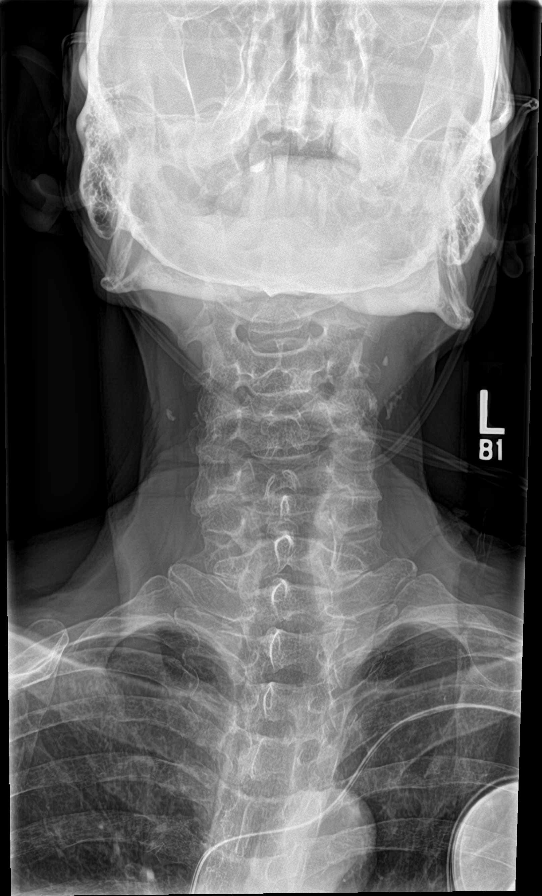

[shoulder ap (1 of 2)]
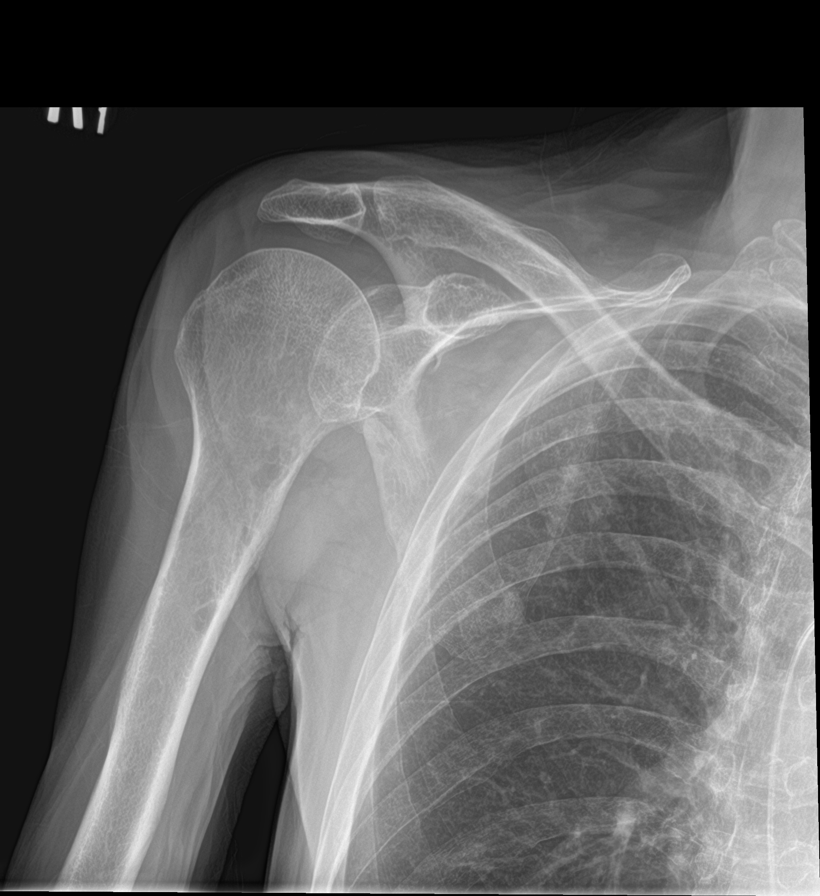

[shoulder ap (2 of 2)]
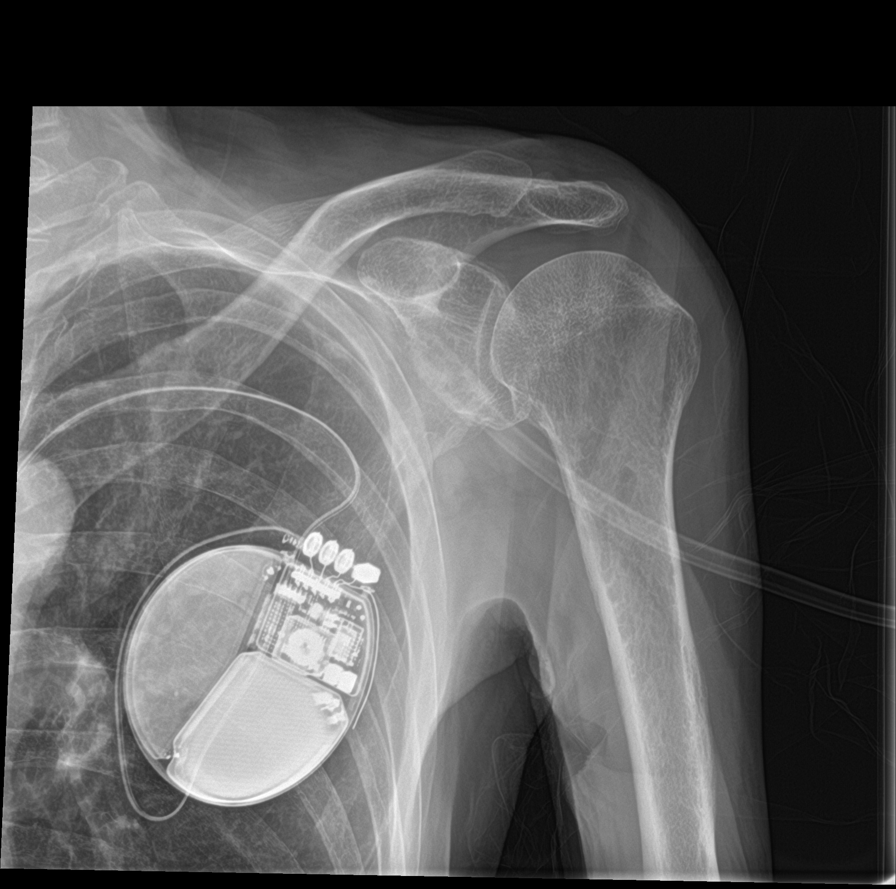

[humerus ap]
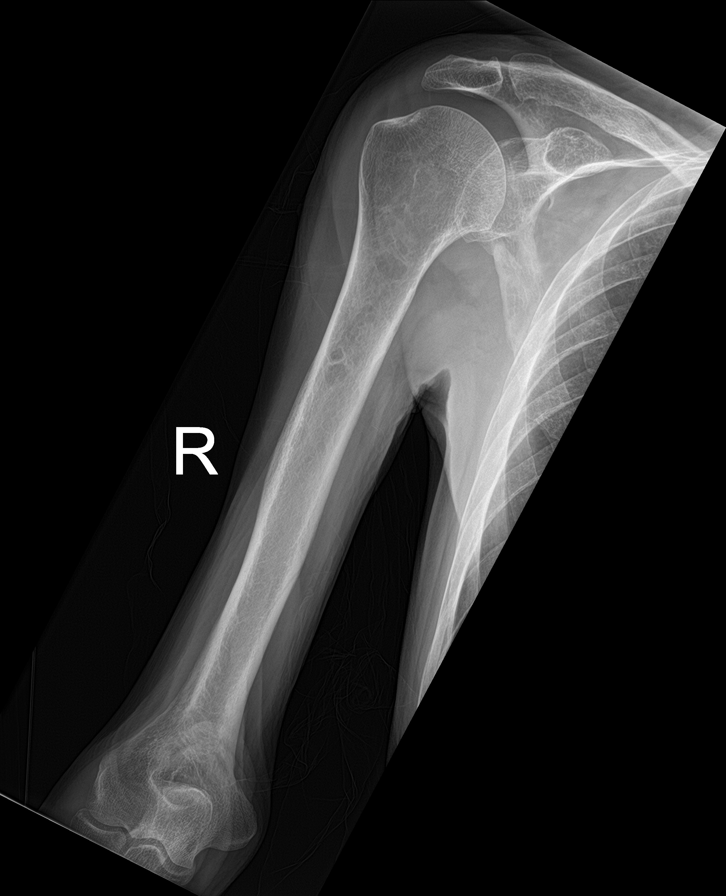

[8 of 10 positions shown; findings below may reference images not displayed]

FINDINGS: Multiple lucencies again noted throughout the skull. Similar
findings noted on prior exam. Diffuse cervicothoracic and
thoracolumbar spine osteopenia and degenerative change. Stable T6,
T12, L1, L2 compression fractures. Multiple lucencies are again
noted about the proximal humeri. Similar findings noted on prior
exam. Stable lucencies noted about the proximal femurs bilaterally.
Stable nodularity noted over the right upper chest. Cardiac pacer
again noted. Carotid vascular calcification again noted.
IMPRESSION: Stable diffuse changes consistent with the patient's known multiple
myeloma. No new lesions identified.

## 2021-12-29 IMAGING — CT CT CHEST W/O CM
2 of 4 series · 15 of 36 positions shown, 18 images · non-contrast
Comparison: 08/21/2018

CLINICAL DATA: Follow-up lung nodules.

EXAM:
CT CHEST WITHOUT CONTRAST
TECHNIQUE: Multidetector CT imaging of the chest was performed following the
standard protocol without IV contrast.

[Series 2: chest 2.00 · axial · 0.73mm/px · z∈[-1257,-925]mm · 12 of 198 slices shown, 15 images]
[im 16/198  mediastinal]
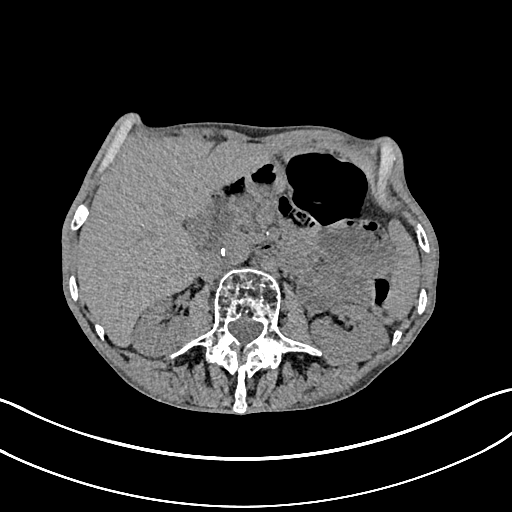
[im 16/198  lung]
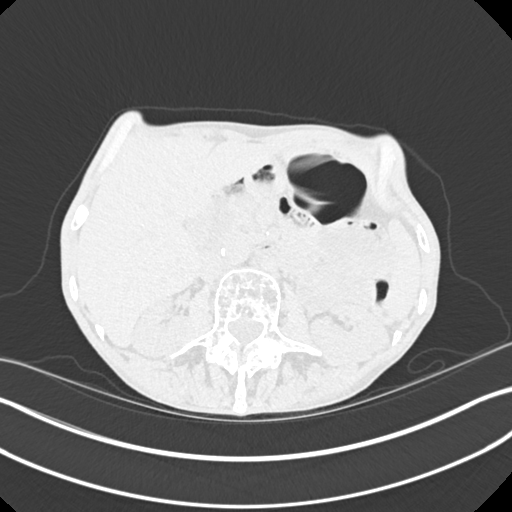
[im 31/198  lung]
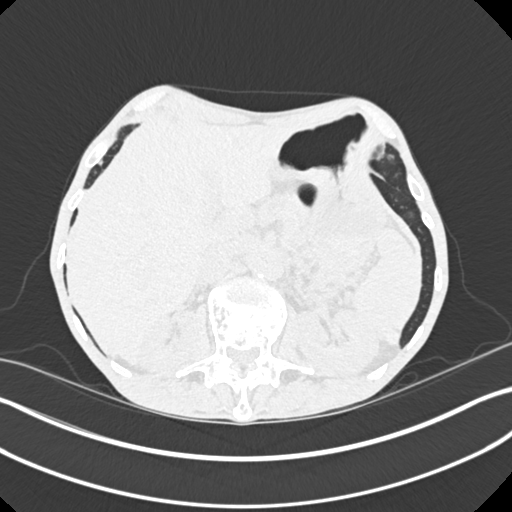
[im 46/198  lung]
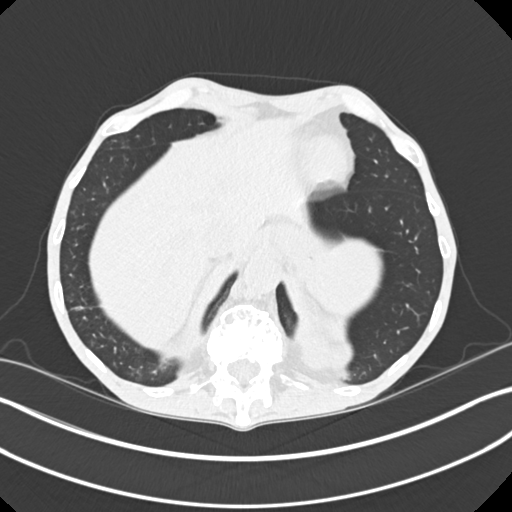
[im 61/198  lung]
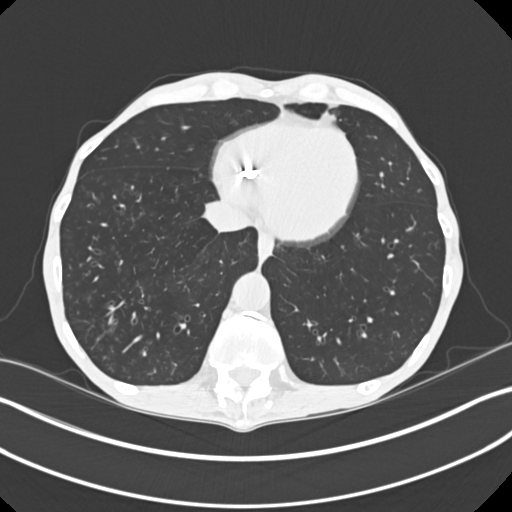
[im 76/198  mediastinal]
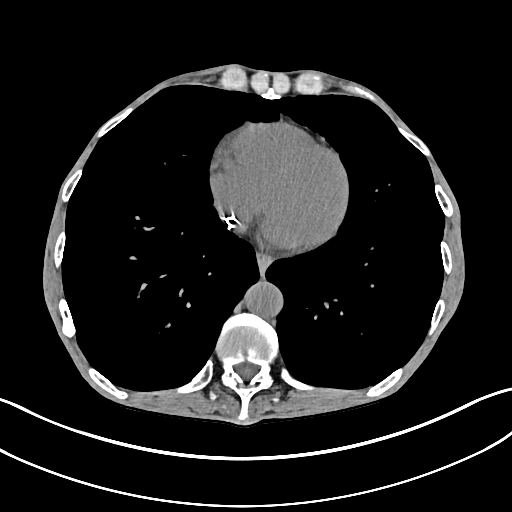
[im 76/198  lung]
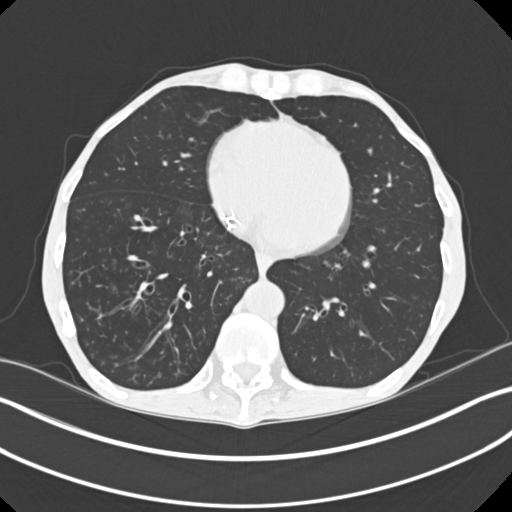
[im 91/198  lung]
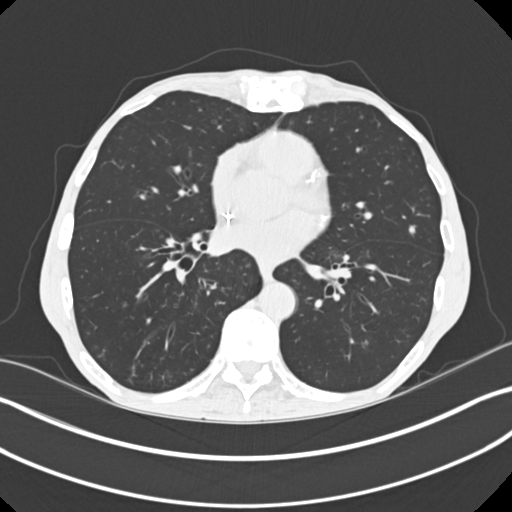
[im 107/198  lung]
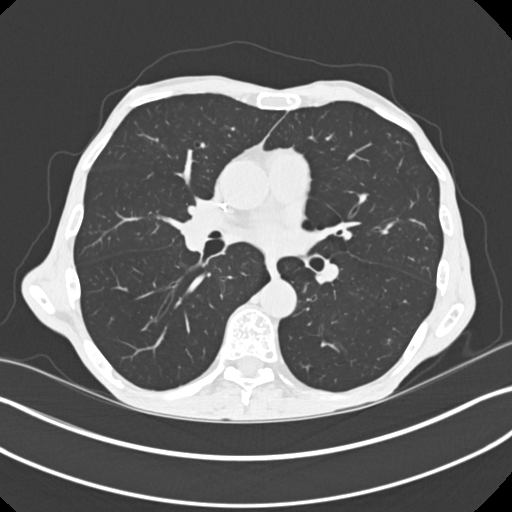
[im 122/198  lung]
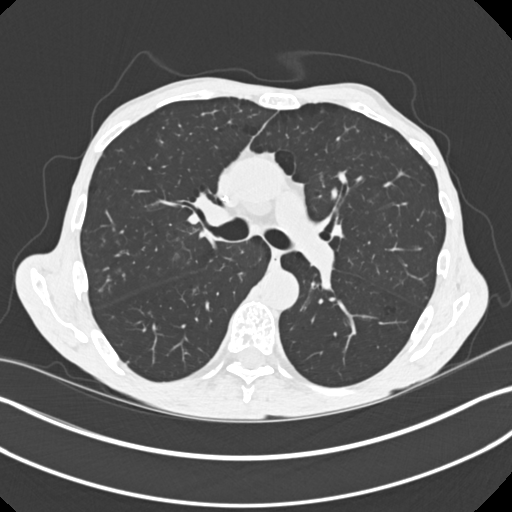
[im 137/198  mediastinal]
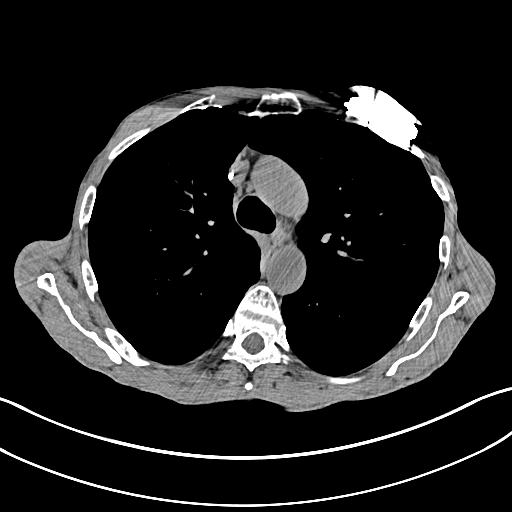
[im 137/198  lung]
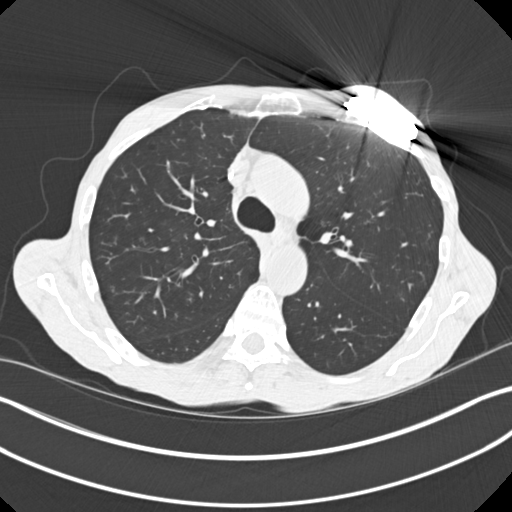
[im 152/198  lung]
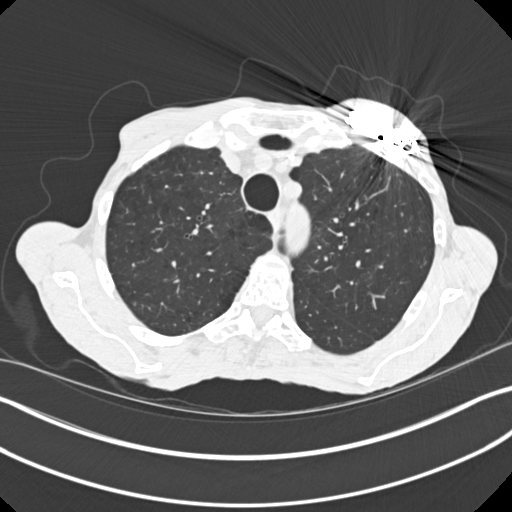
[im 167/198  lung]
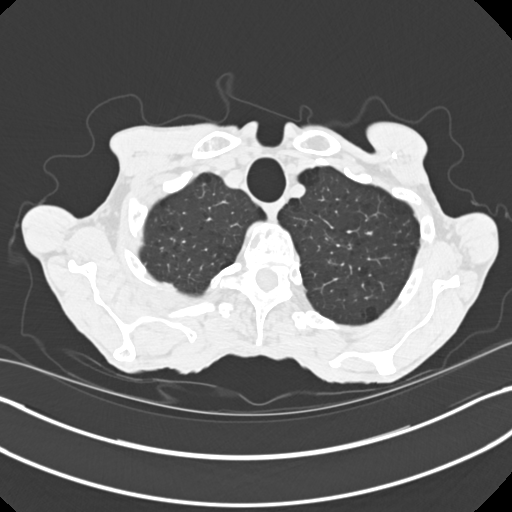
[im 182/198  lung]
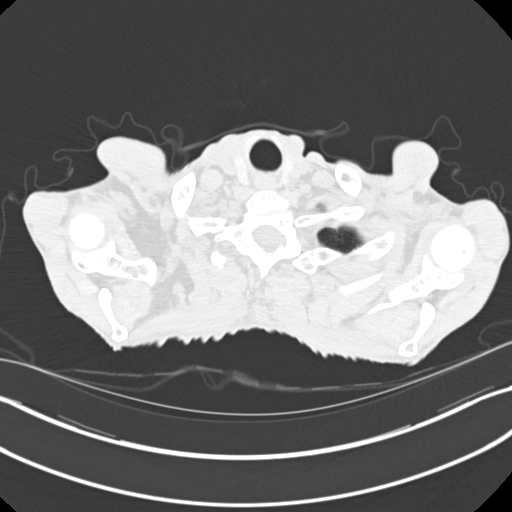

[Series 5: coronals chest 2.00 cor · coronal · 0.73mm/px · 3 of 152 slices shown]
[im 31/152  lung]
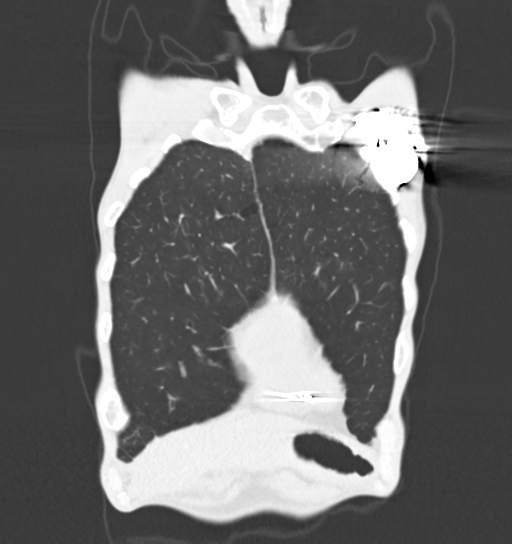
[im 61/152  lung]
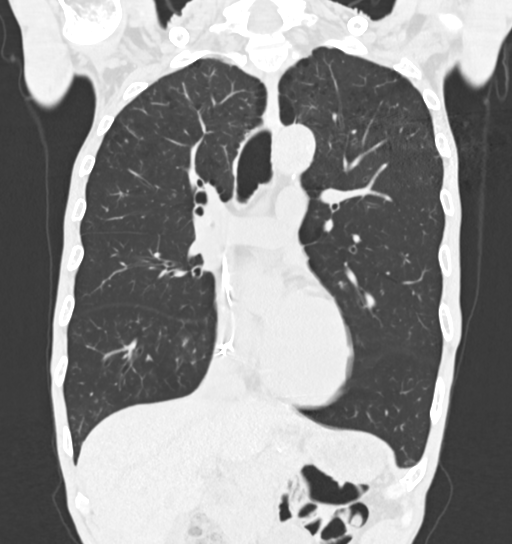
[im 91/152  lung]
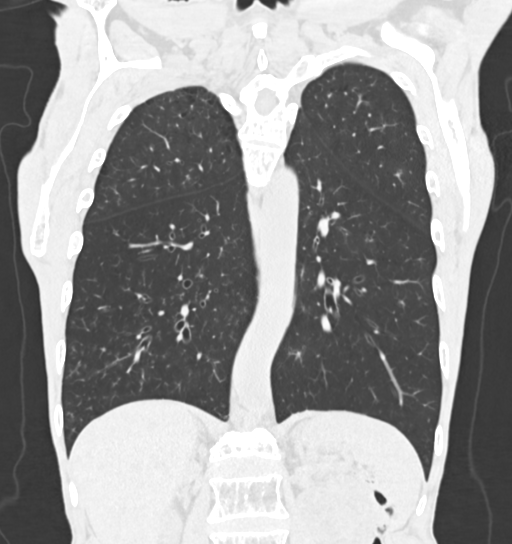

[15 of 36 positions shown; findings below may reference images not displayed]

FINDINGS: Cardiovascular: Left chest wall ICD is noted with lead in the right
ventricle. Normal heart size. No pericardial effusion. Aortic
atherosclerosis. Left circumflex, lad and RCA coronary artery
calcifications.

Mediastinum/Nodes: No enlarged mediastinal or axillary lymph nodes.
Thyroid gland, trachea, and esophagus demonstrate no significant
findings.

Lungs/Pleura: Centrilobular and paraseptal emphysema. Diffuse
bilateral ground-glass centrilobular nodularity is identified in
both lungs. New from previous exam.

Scattered solid lung nodules are again noted.

-previous index solid 4 mm nodule within the lingula is unchanged
from previous exam, image 109/3.

-Posteromedial right upper lobe lung nodule is new measuring 4 mm,
image 76/3.

-Also new from previous exam is a 3 mm posterior right upper lobe
nodule, image 54/3.

Upper Abdomen: Aortic atherosclerosis. No acute abnormality
identified.

Musculoskeletal: Diffuse osteopenia with diffuse lucent lesions
throughout the visualized thoracic skeleton compatible with the
clinical history of multiple myeloma. Healed fracture of the distal
body of sternum. Compression deformities at T6, T9, T12, L1 and L2
are unchanged. Chronic healed right rib deformities.
IMPRESSION: 1. No acute cardiopulmonary abnormalities.
2. Previously referenced 4 mm lingular nodule is stable from
previous exam. However, there are several new milli metric lung
nodules which measure up to 4 mm. No follow-up needed if patient is
low-risk (and has no known or suspected primary neoplasm).
Non-contrast chest CT can be considered in 12 months if patient is
high-risk. This recommendation follows the consensus statement:
Guidelines for Management of Incidental Pulmonary Nodules Detected
[DATE].
3. Interval development of multiple, bilateral, upper and lower lobe
ground-glass centrilobular nodules which in a current smoker is
favored to represent respiratory bronchiolitis-interstitial lung
disease (MAXIMILIAN BARAN).
4. Coronary artery calcifications
5. Diffuse osteopenia with multiple lucent lesions throughout the
visualized bony thorax compatible with the history of multiple
myeloma.

Aortic Atherosclerosis ([id number].D) and Emphysema ([id number].C).
